# Patient Record
Sex: Male | Born: 1945
Health system: Southern US, Community
[De-identification: ages and names within clinical notes are randomized; demographics above are authoritative.]

## PROBLEM LIST (undated history)

## (undated) DIAGNOSIS — L03119 Cellulitis of unspecified part of limb: Secondary | ICD-10-CM

## (undated) DIAGNOSIS — I509 Heart failure, unspecified: Secondary | ICD-10-CM

## (undated) DIAGNOSIS — J189 Pneumonia, unspecified organism: Secondary | ICD-10-CM

## (undated) DIAGNOSIS — N183 Chronic kidney disease, stage 3 unspecified: Secondary | ICD-10-CM

## (undated) DIAGNOSIS — A0471 Enterocolitis due to Clostridium difficile, recurrent: Secondary | ICD-10-CM

## (undated) DIAGNOSIS — M1990 Unspecified osteoarthritis, unspecified site: Secondary | ICD-10-CM

## (undated) DIAGNOSIS — I2510 Atherosclerotic heart disease of native coronary artery without angina pectoris: Secondary | ICD-10-CM

## (undated) DIAGNOSIS — Z87442 Personal history of urinary calculi: Secondary | ICD-10-CM

## (undated) DIAGNOSIS — N179 Acute kidney failure, unspecified: Secondary | ICD-10-CM

## (undated) DIAGNOSIS — E119 Type 2 diabetes mellitus without complications: Secondary | ICD-10-CM

## (undated) DIAGNOSIS — L02419 Cutaneous abscess of limb, unspecified: Secondary | ICD-10-CM

## (undated) HISTORY — PX: CYSTOSCOPY W/ STONE MANIPULATION: SHX1427

## (undated) HISTORY — PX: CATARACT EXTRACTION W/ INTRAOCULAR LENS  IMPLANT, BILATERAL: SHX1307

## (undated) HISTORY — PX: CORONARY ANGIOPLASTY WITH STENT PLACEMENT: SHX49

## (undated) HISTORY — PX: CLOSED REDUCTION HAND FRACTURE: SHX973

---

## 2011-10-28 DIAGNOSIS — Z79899 Other long term (current) drug therapy: Secondary | ICD-10-CM | POA: Diagnosis not present

## 2011-10-28 DIAGNOSIS — Z23 Encounter for immunization: Secondary | ICD-10-CM | POA: Diagnosis not present

## 2011-10-28 DIAGNOSIS — E119 Type 2 diabetes mellitus without complications: Secondary | ICD-10-CM | POA: Diagnosis not present

## 2011-10-28 DIAGNOSIS — Z125 Encounter for screening for malignant neoplasm of prostate: Secondary | ICD-10-CM | POA: Diagnosis not present

## 2011-10-28 DIAGNOSIS — E782 Mixed hyperlipidemia: Secondary | ICD-10-CM | POA: Diagnosis not present

## 2011-12-08 DIAGNOSIS — H33059 Total retinal detachment, unspecified eye: Secondary | ICD-10-CM | POA: Diagnosis not present

## 2012-05-04 DIAGNOSIS — E119 Type 2 diabetes mellitus without complications: Secondary | ICD-10-CM | POA: Diagnosis not present

## 2012-05-04 DIAGNOSIS — E782 Mixed hyperlipidemia: Secondary | ICD-10-CM | POA: Diagnosis not present

## 2012-05-04 DIAGNOSIS — N529 Male erectile dysfunction, unspecified: Secondary | ICD-10-CM | POA: Diagnosis not present

## 2012-05-04 DIAGNOSIS — J069 Acute upper respiratory infection, unspecified: Secondary | ICD-10-CM | POA: Diagnosis not present

## 2012-10-17 DIAGNOSIS — J342 Deviated nasal septum: Secondary | ICD-10-CM | POA: Diagnosis not present

## 2012-10-17 DIAGNOSIS — R490 Dysphonia: Secondary | ICD-10-CM | POA: Diagnosis not present

## 2012-10-17 DIAGNOSIS — Z87891 Personal history of nicotine dependence: Secondary | ICD-10-CM | POA: Diagnosis not present

## 2012-10-17 DIAGNOSIS — J387 Other diseases of larynx: Secondary | ICD-10-CM | POA: Diagnosis not present

## 2012-11-16 DIAGNOSIS — J387 Other diseases of larynx: Secondary | ICD-10-CM | POA: Diagnosis not present

## 2012-11-25 DIAGNOSIS — I509 Heart failure, unspecified: Secondary | ICD-10-CM | POA: Diagnosis not present

## 2012-11-25 DIAGNOSIS — R0789 Other chest pain: Secondary | ICD-10-CM | POA: Diagnosis not present

## 2012-11-25 DIAGNOSIS — I517 Cardiomegaly: Secondary | ICD-10-CM | POA: Diagnosis not present

## 2012-11-25 DIAGNOSIS — R079 Chest pain, unspecified: Secondary | ICD-10-CM | POA: Diagnosis not present

## 2012-11-25 DIAGNOSIS — E119 Type 2 diabetes mellitus without complications: Secondary | ICD-10-CM | POA: Diagnosis not present

## 2012-11-25 DIAGNOSIS — Z87891 Personal history of nicotine dependence: Secondary | ICD-10-CM | POA: Diagnosis not present

## 2012-11-29 DIAGNOSIS — R0609 Other forms of dyspnea: Secondary | ICD-10-CM | POA: Diagnosis not present

## 2012-11-29 DIAGNOSIS — I428 Other cardiomyopathies: Secondary | ICD-10-CM | POA: Diagnosis not present

## 2012-12-14 DIAGNOSIS — I428 Other cardiomyopathies: Secondary | ICD-10-CM | POA: Diagnosis not present

## 2012-12-14 DIAGNOSIS — I359 Nonrheumatic aortic valve disorder, unspecified: Secondary | ICD-10-CM | POA: Diagnosis not present

## 2012-12-15 DIAGNOSIS — I428 Other cardiomyopathies: Secondary | ICD-10-CM | POA: Diagnosis not present

## 2012-12-15 DIAGNOSIS — R0789 Other chest pain: Secondary | ICD-10-CM | POA: Diagnosis not present

## 2013-01-04 DIAGNOSIS — Z833 Family history of diabetes mellitus: Secondary | ICD-10-CM | POA: Diagnosis not present

## 2013-01-04 DIAGNOSIS — R0602 Shortness of breath: Secondary | ICD-10-CM | POA: Diagnosis not present

## 2013-01-04 DIAGNOSIS — R6889 Other general symptoms and signs: Secondary | ICD-10-CM | POA: Diagnosis not present

## 2013-01-04 DIAGNOSIS — Z809 Family history of malignant neoplasm, unspecified: Secondary | ICD-10-CM | POA: Diagnosis not present

## 2013-01-04 DIAGNOSIS — I509 Heart failure, unspecified: Secondary | ICD-10-CM | POA: Diagnosis not present

## 2013-01-04 DIAGNOSIS — I2589 Other forms of chronic ischemic heart disease: Secondary | ICD-10-CM | POA: Diagnosis not present

## 2013-01-04 DIAGNOSIS — Z8249 Family history of ischemic heart disease and other diseases of the circulatory system: Secondary | ICD-10-CM | POA: Diagnosis not present

## 2013-01-04 DIAGNOSIS — I5042 Chronic combined systolic (congestive) and diastolic (congestive) heart failure: Secondary | ICD-10-CM | POA: Diagnosis not present

## 2013-01-04 DIAGNOSIS — E119 Type 2 diabetes mellitus without complications: Secondary | ICD-10-CM | POA: Diagnosis not present

## 2013-01-11 DIAGNOSIS — Z9861 Coronary angioplasty status: Secondary | ICD-10-CM | POA: Diagnosis not present

## 2013-01-11 DIAGNOSIS — E119 Type 2 diabetes mellitus without complications: Secondary | ICD-10-CM | POA: Diagnosis not present

## 2013-01-11 DIAGNOSIS — Z8249 Family history of ischemic heart disease and other diseases of the circulatory system: Secondary | ICD-10-CM | POA: Diagnosis not present

## 2013-01-11 DIAGNOSIS — Z79899 Other long term (current) drug therapy: Secondary | ICD-10-CM | POA: Diagnosis not present

## 2013-01-11 DIAGNOSIS — E785 Hyperlipidemia, unspecified: Secondary | ICD-10-CM | POA: Diagnosis not present

## 2013-01-11 DIAGNOSIS — I517 Cardiomegaly: Secondary | ICD-10-CM | POA: Diagnosis not present

## 2013-01-11 DIAGNOSIS — Z833 Family history of diabetes mellitus: Secondary | ICD-10-CM | POA: Diagnosis not present

## 2013-01-11 DIAGNOSIS — I509 Heart failure, unspecified: Secondary | ICD-10-CM | POA: Diagnosis not present

## 2013-01-11 DIAGNOSIS — Z808 Family history of malignant neoplasm of other organs or systems: Secondary | ICD-10-CM | POA: Diagnosis not present

## 2013-01-11 DIAGNOSIS — I2510 Atherosclerotic heart disease of native coronary artery without angina pectoris: Secondary | ICD-10-CM | POA: Diagnosis not present

## 2013-01-12 DIAGNOSIS — I509 Heart failure, unspecified: Secondary | ICD-10-CM | POA: Diagnosis not present

## 2013-01-12 DIAGNOSIS — Z9861 Coronary angioplasty status: Secondary | ICD-10-CM | POA: Diagnosis not present

## 2013-01-12 DIAGNOSIS — I517 Cardiomegaly: Secondary | ICD-10-CM | POA: Diagnosis not present

## 2013-01-12 DIAGNOSIS — E119 Type 2 diabetes mellitus without complications: Secondary | ICD-10-CM | POA: Diagnosis not present

## 2013-01-12 DIAGNOSIS — E785 Hyperlipidemia, unspecified: Secondary | ICD-10-CM | POA: Diagnosis not present

## 2013-01-12 DIAGNOSIS — Z8249 Family history of ischemic heart disease and other diseases of the circulatory system: Secondary | ICD-10-CM | POA: Diagnosis not present

## 2013-01-12 DIAGNOSIS — Z833 Family history of diabetes mellitus: Secondary | ICD-10-CM | POA: Diagnosis not present

## 2013-01-12 DIAGNOSIS — I2510 Atherosclerotic heart disease of native coronary artery without angina pectoris: Secondary | ICD-10-CM | POA: Diagnosis not present

## 2013-01-20 DIAGNOSIS — R0602 Shortness of breath: Secondary | ICD-10-CM | POA: Diagnosis not present

## 2013-01-20 DIAGNOSIS — I2589 Other forms of chronic ischemic heart disease: Secondary | ICD-10-CM | POA: Diagnosis not present

## 2013-01-20 DIAGNOSIS — I5042 Chronic combined systolic (congestive) and diastolic (congestive) heart failure: Secondary | ICD-10-CM | POA: Diagnosis not present

## 2013-02-01 DIAGNOSIS — I509 Heart failure, unspecified: Secondary | ICD-10-CM | POA: Diagnosis not present

## 2013-02-01 DIAGNOSIS — I5042 Chronic combined systolic (congestive) and diastolic (congestive) heart failure: Secondary | ICD-10-CM | POA: Diagnosis not present

## 2013-02-01 DIAGNOSIS — R0602 Shortness of breath: Secondary | ICD-10-CM | POA: Diagnosis not present

## 2013-02-01 DIAGNOSIS — I2589 Other forms of chronic ischemic heart disease: Secondary | ICD-10-CM | POA: Diagnosis not present

## 2013-02-14 DIAGNOSIS — Z9861 Coronary angioplasty status: Secondary | ICD-10-CM | POA: Diagnosis not present

## 2013-02-14 DIAGNOSIS — Z5189 Encounter for other specified aftercare: Secondary | ICD-10-CM | POA: Diagnosis not present

## 2013-02-15 DIAGNOSIS — Z5189 Encounter for other specified aftercare: Secondary | ICD-10-CM | POA: Diagnosis not present

## 2013-02-15 DIAGNOSIS — Z9861 Coronary angioplasty status: Secondary | ICD-10-CM | POA: Diagnosis not present

## 2013-02-17 DIAGNOSIS — Z9861 Coronary angioplasty status: Secondary | ICD-10-CM | POA: Diagnosis not present

## 2013-02-17 DIAGNOSIS — Z5189 Encounter for other specified aftercare: Secondary | ICD-10-CM | POA: Diagnosis not present

## 2013-02-20 DIAGNOSIS — Z5189 Encounter for other specified aftercare: Secondary | ICD-10-CM | POA: Diagnosis not present

## 2013-02-20 DIAGNOSIS — Z9861 Coronary angioplasty status: Secondary | ICD-10-CM | POA: Diagnosis not present

## 2013-02-22 DIAGNOSIS — Z5189 Encounter for other specified aftercare: Secondary | ICD-10-CM | POA: Diagnosis not present

## 2013-02-22 DIAGNOSIS — Z9861 Coronary angioplasty status: Secondary | ICD-10-CM | POA: Diagnosis not present

## 2013-02-24 DIAGNOSIS — Z9861 Coronary angioplasty status: Secondary | ICD-10-CM | POA: Diagnosis not present

## 2013-02-24 DIAGNOSIS — Z5189 Encounter for other specified aftercare: Secondary | ICD-10-CM | POA: Diagnosis not present

## 2013-02-27 DIAGNOSIS — Z5189 Encounter for other specified aftercare: Secondary | ICD-10-CM | POA: Diagnosis not present

## 2013-02-27 DIAGNOSIS — Z9861 Coronary angioplasty status: Secondary | ICD-10-CM | POA: Diagnosis not present

## 2013-02-28 DIAGNOSIS — I428 Other cardiomyopathies: Secondary | ICD-10-CM | POA: Diagnosis not present

## 2013-02-28 DIAGNOSIS — E782 Mixed hyperlipidemia: Secondary | ICD-10-CM | POA: Diagnosis not present

## 2013-02-28 DIAGNOSIS — Z125 Encounter for screening for malignant neoplasm of prostate: Secondary | ICD-10-CM | POA: Diagnosis not present

## 2013-02-28 DIAGNOSIS — I2589 Other forms of chronic ischemic heart disease: Secondary | ICD-10-CM | POA: Diagnosis not present

## 2013-02-28 DIAGNOSIS — R0602 Shortness of breath: Secondary | ICD-10-CM | POA: Diagnosis not present

## 2013-02-28 DIAGNOSIS — E785 Hyperlipidemia, unspecified: Secondary | ICD-10-CM | POA: Diagnosis not present

## 2013-02-28 DIAGNOSIS — Z79899 Other long term (current) drug therapy: Secondary | ICD-10-CM | POA: Diagnosis not present

## 2013-02-28 DIAGNOSIS — E119 Type 2 diabetes mellitus without complications: Secondary | ICD-10-CM | POA: Diagnosis not present

## 2013-02-28 DIAGNOSIS — R972 Elevated prostate specific antigen [PSA]: Secondary | ICD-10-CM | POA: Diagnosis not present

## 2013-02-28 DIAGNOSIS — I5042 Chronic combined systolic (congestive) and diastolic (congestive) heart failure: Secondary | ICD-10-CM | POA: Diagnosis not present

## 2013-02-28 DIAGNOSIS — Z0000 Encounter for general adult medical examination without abnormal findings: Secondary | ICD-10-CM | POA: Diagnosis not present

## 2013-02-28 DIAGNOSIS — I509 Heart failure, unspecified: Secondary | ICD-10-CM | POA: Diagnosis not present

## 2013-03-01 DIAGNOSIS — Z9861 Coronary angioplasty status: Secondary | ICD-10-CM | POA: Diagnosis not present

## 2013-03-01 DIAGNOSIS — Z5189 Encounter for other specified aftercare: Secondary | ICD-10-CM | POA: Diagnosis not present

## 2013-03-03 DIAGNOSIS — Z5189 Encounter for other specified aftercare: Secondary | ICD-10-CM | POA: Diagnosis not present

## 2013-03-03 DIAGNOSIS — Z9861 Coronary angioplasty status: Secondary | ICD-10-CM | POA: Diagnosis not present

## 2013-03-06 DIAGNOSIS — Z5189 Encounter for other specified aftercare: Secondary | ICD-10-CM | POA: Diagnosis not present

## 2013-03-06 DIAGNOSIS — Z9861 Coronary angioplasty status: Secondary | ICD-10-CM | POA: Diagnosis not present

## 2013-03-08 DIAGNOSIS — Z9861 Coronary angioplasty status: Secondary | ICD-10-CM | POA: Diagnosis not present

## 2013-03-08 DIAGNOSIS — Z5189 Encounter for other specified aftercare: Secondary | ICD-10-CM | POA: Diagnosis not present

## 2013-03-13 DIAGNOSIS — Z5189 Encounter for other specified aftercare: Secondary | ICD-10-CM | POA: Diagnosis not present

## 2013-03-13 DIAGNOSIS — Z9861 Coronary angioplasty status: Secondary | ICD-10-CM | POA: Diagnosis not present

## 2013-03-27 DIAGNOSIS — S63509A Unspecified sprain of unspecified wrist, initial encounter: Secondary | ICD-10-CM | POA: Diagnosis not present

## 2013-04-12 DIAGNOSIS — Z9861 Coronary angioplasty status: Secondary | ICD-10-CM | POA: Diagnosis not present

## 2013-04-12 DIAGNOSIS — Z5189 Encounter for other specified aftercare: Secondary | ICD-10-CM | POA: Diagnosis not present

## 2013-04-17 DIAGNOSIS — Z5189 Encounter for other specified aftercare: Secondary | ICD-10-CM | POA: Diagnosis not present

## 2013-04-17 DIAGNOSIS — Z9861 Coronary angioplasty status: Secondary | ICD-10-CM | POA: Diagnosis not present

## 2013-04-19 DIAGNOSIS — Z9861 Coronary angioplasty status: Secondary | ICD-10-CM | POA: Diagnosis not present

## 2013-04-19 DIAGNOSIS — Z5189 Encounter for other specified aftercare: Secondary | ICD-10-CM | POA: Diagnosis not present

## 2013-04-21 DIAGNOSIS — Z9861 Coronary angioplasty status: Secondary | ICD-10-CM | POA: Diagnosis not present

## 2013-04-21 DIAGNOSIS — Z5189 Encounter for other specified aftercare: Secondary | ICD-10-CM | POA: Diagnosis not present

## 2013-04-24 DIAGNOSIS — Z5189 Encounter for other specified aftercare: Secondary | ICD-10-CM | POA: Diagnosis not present

## 2013-04-24 DIAGNOSIS — Z9861 Coronary angioplasty status: Secondary | ICD-10-CM | POA: Diagnosis not present

## 2013-04-26 DIAGNOSIS — Z9861 Coronary angioplasty status: Secondary | ICD-10-CM | POA: Diagnosis not present

## 2013-04-26 DIAGNOSIS — Z5189 Encounter for other specified aftercare: Secondary | ICD-10-CM | POA: Diagnosis not present

## 2013-04-28 DIAGNOSIS — Z9861 Coronary angioplasty status: Secondary | ICD-10-CM | POA: Diagnosis not present

## 2013-04-28 DIAGNOSIS — Z5189 Encounter for other specified aftercare: Secondary | ICD-10-CM | POA: Diagnosis not present

## 2013-05-01 DIAGNOSIS — Z9861 Coronary angioplasty status: Secondary | ICD-10-CM | POA: Diagnosis not present

## 2013-05-01 DIAGNOSIS — Z5189 Encounter for other specified aftercare: Secondary | ICD-10-CM | POA: Diagnosis not present

## 2013-05-03 DIAGNOSIS — Z9861 Coronary angioplasty status: Secondary | ICD-10-CM | POA: Diagnosis not present

## 2013-05-03 DIAGNOSIS — Z5189 Encounter for other specified aftercare: Secondary | ICD-10-CM | POA: Diagnosis not present

## 2013-05-08 DIAGNOSIS — Z5189 Encounter for other specified aftercare: Secondary | ICD-10-CM | POA: Diagnosis not present

## 2013-05-08 DIAGNOSIS — Z9861 Coronary angioplasty status: Secondary | ICD-10-CM | POA: Diagnosis not present

## 2013-05-10 DIAGNOSIS — Z9861 Coronary angioplasty status: Secondary | ICD-10-CM | POA: Diagnosis not present

## 2013-05-10 DIAGNOSIS — Z5189 Encounter for other specified aftercare: Secondary | ICD-10-CM | POA: Diagnosis not present

## 2013-05-31 DIAGNOSIS — I509 Heart failure, unspecified: Secondary | ICD-10-CM | POA: Diagnosis not present

## 2013-05-31 DIAGNOSIS — R0602 Shortness of breath: Secondary | ICD-10-CM | POA: Diagnosis not present

## 2013-05-31 DIAGNOSIS — I5042 Chronic combined systolic (congestive) and diastolic (congestive) heart failure: Secondary | ICD-10-CM | POA: Diagnosis not present

## 2013-05-31 DIAGNOSIS — I2589 Other forms of chronic ischemic heart disease: Secondary | ICD-10-CM | POA: Diagnosis not present

## 2013-07-26 DIAGNOSIS — I428 Other cardiomyopathies: Secondary | ICD-10-CM | POA: Diagnosis not present

## 2013-07-26 DIAGNOSIS — I509 Heart failure, unspecified: Secondary | ICD-10-CM | POA: Diagnosis not present

## 2013-08-01 DIAGNOSIS — R0602 Shortness of breath: Secondary | ICD-10-CM | POA: Diagnosis not present

## 2013-08-01 DIAGNOSIS — I509 Heart failure, unspecified: Secondary | ICD-10-CM | POA: Diagnosis not present

## 2013-08-01 DIAGNOSIS — E785 Hyperlipidemia, unspecified: Secondary | ICD-10-CM | POA: Diagnosis not present

## 2013-08-01 DIAGNOSIS — I5042 Chronic combined systolic (congestive) and diastolic (congestive) heart failure: Secondary | ICD-10-CM | POA: Diagnosis not present

## 2013-08-01 DIAGNOSIS — E119 Type 2 diabetes mellitus without complications: Secondary | ICD-10-CM | POA: Diagnosis not present

## 2013-08-01 DIAGNOSIS — I2589 Other forms of chronic ischemic heart disease: Secondary | ICD-10-CM | POA: Diagnosis not present

## 2013-08-02 DIAGNOSIS — E119 Type 2 diabetes mellitus without complications: Secondary | ICD-10-CM | POA: Diagnosis not present

## 2013-08-02 DIAGNOSIS — D170 Benign lipomatous neoplasm of skin and subcutaneous tissue of head, face and neck: Secondary | ICD-10-CM | POA: Diagnosis not present

## 2013-08-02 DIAGNOSIS — I2510 Atherosclerotic heart disease of native coronary artery without angina pectoris: Secondary | ICD-10-CM | POA: Diagnosis not present

## 2013-08-02 DIAGNOSIS — E7800 Pure hypercholesterolemia, unspecified: Secondary | ICD-10-CM | POA: Diagnosis not present

## 2013-09-22 DIAGNOSIS — R55 Syncope and collapse: Secondary | ICD-10-CM | POA: Diagnosis not present

## 2013-09-22 DIAGNOSIS — R5381 Other malaise: Secondary | ICD-10-CM | POA: Diagnosis not present

## 2013-12-12 DIAGNOSIS — Z125 Encounter for screening for malignant neoplasm of prostate: Secondary | ICD-10-CM | POA: Diagnosis not present

## 2013-12-12 DIAGNOSIS — R972 Elevated prostate specific antigen [PSA]: Secondary | ICD-10-CM | POA: Diagnosis not present

## 2013-12-12 DIAGNOSIS — I2589 Other forms of chronic ischemic heart disease: Secondary | ICD-10-CM | POA: Diagnosis not present

## 2013-12-12 DIAGNOSIS — E119 Type 2 diabetes mellitus without complications: Secondary | ICD-10-CM | POA: Diagnosis not present

## 2013-12-12 DIAGNOSIS — Z79899 Other long term (current) drug therapy: Secondary | ICD-10-CM | POA: Diagnosis not present

## 2013-12-12 DIAGNOSIS — E782 Mixed hyperlipidemia: Secondary | ICD-10-CM | POA: Diagnosis not present

## 2014-04-05 DIAGNOSIS — I2510 Atherosclerotic heart disease of native coronary artery without angina pectoris: Secondary | ICD-10-CM | POA: Diagnosis not present

## 2014-04-05 DIAGNOSIS — I7090 Unspecified atherosclerosis: Secondary | ICD-10-CM | POA: Diagnosis not present

## 2014-04-05 DIAGNOSIS — E785 Hyperlipidemia, unspecified: Secondary | ICD-10-CM | POA: Diagnosis not present

## 2014-04-05 DIAGNOSIS — I2589 Other forms of chronic ischemic heart disease: Secondary | ICD-10-CM | POA: Diagnosis not present

## 2014-04-05 DIAGNOSIS — Z9889 Other specified postprocedural states: Secondary | ICD-10-CM | POA: Diagnosis not present

## 2014-04-05 DIAGNOSIS — I509 Heart failure, unspecified: Secondary | ICD-10-CM | POA: Diagnosis not present

## 2014-04-05 DIAGNOSIS — I5042 Chronic combined systolic (congestive) and diastolic (congestive) heart failure: Secondary | ICD-10-CM | POA: Diagnosis not present

## 2014-08-17 DIAGNOSIS — E119 Type 2 diabetes mellitus without complications: Secondary | ICD-10-CM | POA: Diagnosis not present

## 2014-08-17 DIAGNOSIS — J209 Acute bronchitis, unspecified: Secondary | ICD-10-CM | POA: Diagnosis not present

## 2014-08-17 DIAGNOSIS — Z23 Encounter for immunization: Secondary | ICD-10-CM | POA: Diagnosis not present

## 2014-09-24 DIAGNOSIS — Z79899 Other long term (current) drug therapy: Secondary | ICD-10-CM | POA: Diagnosis not present

## 2014-09-24 DIAGNOSIS — E119 Type 2 diabetes mellitus without complications: Secondary | ICD-10-CM | POA: Diagnosis not present

## 2014-09-24 DIAGNOSIS — R972 Elevated prostate specific antigen [PSA]: Secondary | ICD-10-CM | POA: Diagnosis not present

## 2014-09-24 DIAGNOSIS — Z125 Encounter for screening for malignant neoplasm of prostate: Secondary | ICD-10-CM | POA: Diagnosis not present

## 2014-09-24 DIAGNOSIS — D649 Anemia, unspecified: Secondary | ICD-10-CM | POA: Diagnosis not present

## 2015-02-21 DIAGNOSIS — E538 Deficiency of other specified B group vitamins: Secondary | ICD-10-CM | POA: Diagnosis not present

## 2015-02-21 DIAGNOSIS — I255 Ischemic cardiomyopathy: Secondary | ICD-10-CM | POA: Diagnosis not present

## 2015-02-21 DIAGNOSIS — Z79899 Other long term (current) drug therapy: Secondary | ICD-10-CM | POA: Diagnosis not present

## 2015-02-21 DIAGNOSIS — E782 Mixed hyperlipidemia: Secondary | ICD-10-CM | POA: Diagnosis not present

## 2015-02-21 DIAGNOSIS — E1165 Type 2 diabetes mellitus with hyperglycemia: Secondary | ICD-10-CM | POA: Diagnosis not present

## 2015-02-21 DIAGNOSIS — Z0000 Encounter for general adult medical examination without abnormal findings: Secondary | ICD-10-CM | POA: Diagnosis not present

## 2015-04-03 DIAGNOSIS — J0120 Acute ethmoidal sinusitis, unspecified: Secondary | ICD-10-CM | POA: Diagnosis not present

## 2015-10-10 DIAGNOSIS — E1165 Type 2 diabetes mellitus with hyperglycemia: Secondary | ICD-10-CM | POA: Diagnosis not present

## 2015-10-10 DIAGNOSIS — Z79899 Other long term (current) drug therapy: Secondary | ICD-10-CM | POA: Diagnosis not present

## 2015-10-10 DIAGNOSIS — E782 Mixed hyperlipidemia: Secondary | ICD-10-CM | POA: Diagnosis not present

## 2015-10-10 DIAGNOSIS — I255 Ischemic cardiomyopathy: Secondary | ICD-10-CM | POA: Diagnosis not present

## 2015-10-10 DIAGNOSIS — K219 Gastro-esophageal reflux disease without esophagitis: Secondary | ICD-10-CM | POA: Diagnosis not present

## 2016-01-16 DIAGNOSIS — Z79899 Other long term (current) drug therapy: Secondary | ICD-10-CM | POA: Diagnosis not present

## 2016-01-16 DIAGNOSIS — I255 Ischemic cardiomyopathy: Secondary | ICD-10-CM | POA: Diagnosis not present

## 2016-01-16 DIAGNOSIS — E1165 Type 2 diabetes mellitus with hyperglycemia: Secondary | ICD-10-CM | POA: Diagnosis not present

## 2016-01-23 DIAGNOSIS — I255 Ischemic cardiomyopathy: Secondary | ICD-10-CM | POA: Diagnosis not present

## 2016-01-23 DIAGNOSIS — G4710 Hypersomnia, unspecified: Secondary | ICD-10-CM | POA: Diagnosis not present

## 2016-01-23 DIAGNOSIS — E1165 Type 2 diabetes mellitus with hyperglycemia: Secondary | ICD-10-CM | POA: Diagnosis not present

## 2016-01-23 DIAGNOSIS — J0120 Acute ethmoidal sinusitis, unspecified: Secondary | ICD-10-CM | POA: Diagnosis not present

## 2016-02-26 DIAGNOSIS — R0609 Other forms of dyspnea: Secondary | ICD-10-CM | POA: Diagnosis not present

## 2016-02-26 DIAGNOSIS — Z959 Presence of cardiac and vascular implant and graft, unspecified: Secondary | ICD-10-CM | POA: Diagnosis not present

## 2016-02-26 DIAGNOSIS — I255 Ischemic cardiomyopathy: Secondary | ICD-10-CM | POA: Diagnosis not present

## 2016-02-26 DIAGNOSIS — I5042 Chronic combined systolic (congestive) and diastolic (congestive) heart failure: Secondary | ICD-10-CM | POA: Diagnosis not present

## 2016-02-26 DIAGNOSIS — R600 Localized edema: Secondary | ICD-10-CM | POA: Diagnosis not present

## 2016-02-26 DIAGNOSIS — E785 Hyperlipidemia, unspecified: Secondary | ICD-10-CM | POA: Diagnosis not present

## 2016-02-26 DIAGNOSIS — I2510 Atherosclerotic heart disease of native coronary artery without angina pectoris: Secondary | ICD-10-CM | POA: Diagnosis not present

## 2016-02-27 DIAGNOSIS — I361 Nonrheumatic tricuspid (valve) insufficiency: Secondary | ICD-10-CM | POA: Diagnosis not present

## 2016-02-27 DIAGNOSIS — I517 Cardiomegaly: Secondary | ICD-10-CM | POA: Diagnosis not present

## 2016-02-27 DIAGNOSIS — I272 Other secondary pulmonary hypertension: Secondary | ICD-10-CM | POA: Diagnosis not present

## 2016-03-17 DIAGNOSIS — I340 Nonrheumatic mitral (valve) insufficiency: Secondary | ICD-10-CM | POA: Diagnosis not present

## 2016-03-17 DIAGNOSIS — R0609 Other forms of dyspnea: Secondary | ICD-10-CM | POA: Diagnosis not present

## 2016-03-17 DIAGNOSIS — I255 Ischemic cardiomyopathy: Secondary | ICD-10-CM | POA: Diagnosis not present

## 2016-03-17 DIAGNOSIS — I517 Cardiomegaly: Secondary | ICD-10-CM | POA: Diagnosis not present

## 2016-03-17 DIAGNOSIS — I5042 Chronic combined systolic (congestive) and diastolic (congestive) heart failure: Secondary | ICD-10-CM | POA: Diagnosis not present

## 2016-03-17 DIAGNOSIS — R0602 Shortness of breath: Secondary | ICD-10-CM | POA: Diagnosis not present

## 2016-03-17 DIAGNOSIS — I2510 Atherosclerotic heart disease of native coronary artery without angina pectoris: Secondary | ICD-10-CM | POA: Diagnosis not present

## 2016-03-17 DIAGNOSIS — E785 Hyperlipidemia, unspecified: Secondary | ICD-10-CM | POA: Diagnosis not present

## 2016-03-17 DIAGNOSIS — Z959 Presence of cardiac and vascular implant and graft, unspecified: Secondary | ICD-10-CM | POA: Diagnosis not present

## 2016-03-24 DIAGNOSIS — Z955 Presence of coronary angioplasty implant and graft: Secondary | ICD-10-CM | POA: Diagnosis not present

## 2016-03-24 DIAGNOSIS — Z87891 Personal history of nicotine dependence: Secondary | ICD-10-CM | POA: Diagnosis not present

## 2016-03-24 DIAGNOSIS — I420 Dilated cardiomyopathy: Secondary | ICD-10-CM | POA: Diagnosis not present

## 2016-03-24 DIAGNOSIS — Z79899 Other long term (current) drug therapy: Secondary | ICD-10-CM | POA: Diagnosis not present

## 2016-03-24 DIAGNOSIS — E785 Hyperlipidemia, unspecified: Secondary | ICD-10-CM | POA: Diagnosis not present

## 2016-03-24 DIAGNOSIS — I2510 Atherosclerotic heart disease of native coronary artery without angina pectoris: Secondary | ICD-10-CM | POA: Diagnosis not present

## 2016-03-24 DIAGNOSIS — I501 Left ventricular failure: Secondary | ICD-10-CM | POA: Diagnosis not present

## 2016-03-24 DIAGNOSIS — I4581 Long QT syndrome: Secondary | ICD-10-CM | POA: Diagnosis not present

## 2016-03-24 DIAGNOSIS — E119 Type 2 diabetes mellitus without complications: Secondary | ICD-10-CM | POA: Diagnosis not present

## 2016-03-24 DIAGNOSIS — I509 Heart failure, unspecified: Secondary | ICD-10-CM | POA: Diagnosis not present

## 2016-03-24 DIAGNOSIS — Z7984 Long term (current) use of oral hypoglycemic drugs: Secondary | ICD-10-CM | POA: Diagnosis not present

## 2016-03-24 DIAGNOSIS — Z7982 Long term (current) use of aspirin: Secondary | ICD-10-CM | POA: Diagnosis not present

## 2016-04-10 DIAGNOSIS — I2510 Atherosclerotic heart disease of native coronary artery without angina pectoris: Secondary | ICD-10-CM | POA: Diagnosis not present

## 2016-04-10 DIAGNOSIS — Z959 Presence of cardiac and vascular implant and graft, unspecified: Secondary | ICD-10-CM | POA: Diagnosis not present

## 2016-04-10 DIAGNOSIS — I5042 Chronic combined systolic (congestive) and diastolic (congestive) heart failure: Secondary | ICD-10-CM | POA: Diagnosis not present

## 2016-04-10 DIAGNOSIS — R0609 Other forms of dyspnea: Secondary | ICD-10-CM | POA: Diagnosis not present

## 2016-04-10 DIAGNOSIS — I255 Ischemic cardiomyopathy: Secondary | ICD-10-CM | POA: Diagnosis not present

## 2016-05-13 DIAGNOSIS — I255 Ischemic cardiomyopathy: Secondary | ICD-10-CM | POA: Diagnosis not present

## 2016-05-13 DIAGNOSIS — I5042 Chronic combined systolic (congestive) and diastolic (congestive) heart failure: Secondary | ICD-10-CM | POA: Diagnosis not present

## 2016-05-13 DIAGNOSIS — I2510 Atherosclerotic heart disease of native coronary artery without angina pectoris: Secondary | ICD-10-CM | POA: Diagnosis not present

## 2016-05-13 DIAGNOSIS — Z955 Presence of coronary angioplasty implant and graft: Secondary | ICD-10-CM | POA: Diagnosis not present

## 2016-05-26 DIAGNOSIS — I429 Cardiomyopathy, unspecified: Secondary | ICD-10-CM | POA: Diagnosis not present

## 2016-05-28 DIAGNOSIS — I255 Ischemic cardiomyopathy: Secondary | ICD-10-CM | POA: Diagnosis not present

## 2016-05-28 DIAGNOSIS — I2510 Atherosclerotic heart disease of native coronary artery without angina pectoris: Secondary | ICD-10-CM | POA: Diagnosis not present

## 2016-05-28 DIAGNOSIS — I5042 Chronic combined systolic (congestive) and diastolic (congestive) heart failure: Secondary | ICD-10-CM | POA: Diagnosis not present

## 2016-06-03 DIAGNOSIS — I83029 Varicose veins of left lower extremity with ulcer of unspecified site: Secondary | ICD-10-CM | POA: Diagnosis not present

## 2016-06-03 DIAGNOSIS — R609 Edema, unspecified: Secondary | ICD-10-CM | POA: Diagnosis not present

## 2016-06-10 DIAGNOSIS — Z1389 Encounter for screening for other disorder: Secondary | ICD-10-CM | POA: Diagnosis not present

## 2016-06-10 DIAGNOSIS — Z9181 History of falling: Secondary | ICD-10-CM | POA: Diagnosis not present

## 2016-06-10 DIAGNOSIS — I83029 Varicose veins of left lower extremity with ulcer of unspecified site: Secondary | ICD-10-CM | POA: Diagnosis not present

## 2016-08-21 DIAGNOSIS — I2510 Atherosclerotic heart disease of native coronary artery without angina pectoris: Secondary | ICD-10-CM | POA: Diagnosis not present

## 2016-08-21 DIAGNOSIS — I5042 Chronic combined systolic (congestive) and diastolic (congestive) heart failure: Secondary | ICD-10-CM | POA: Diagnosis not present

## 2016-08-21 DIAGNOSIS — I255 Ischemic cardiomyopathy: Secondary | ICD-10-CM | POA: Diagnosis not present

## 2016-12-02 DIAGNOSIS — Z23 Encounter for immunization: Secondary | ICD-10-CM | POA: Diagnosis not present

## 2016-12-02 DIAGNOSIS — I255 Ischemic cardiomyopathy: Secondary | ICD-10-CM | POA: Diagnosis not present

## 2016-12-02 DIAGNOSIS — I872 Venous insufficiency (chronic) (peripheral): Secondary | ICD-10-CM | POA: Diagnosis not present

## 2016-12-02 DIAGNOSIS — E1165 Type 2 diabetes mellitus with hyperglycemia: Secondary | ICD-10-CM | POA: Diagnosis not present

## 2016-12-02 DIAGNOSIS — Z79899 Other long term (current) drug therapy: Secondary | ICD-10-CM | POA: Diagnosis not present

## 2016-12-10 DIAGNOSIS — I255 Ischemic cardiomyopathy: Secondary | ICD-10-CM | POA: Diagnosis not present

## 2016-12-10 DIAGNOSIS — I5023 Acute on chronic systolic (congestive) heart failure: Secondary | ICD-10-CM | POA: Diagnosis not present

## 2016-12-10 DIAGNOSIS — I2510 Atherosclerotic heart disease of native coronary artery without angina pectoris: Secondary | ICD-10-CM | POA: Diagnosis present

## 2016-12-10 DIAGNOSIS — I509 Heart failure, unspecified: Secondary | ICD-10-CM | POA: Diagnosis not present

## 2016-12-10 DIAGNOSIS — Z79899 Other long term (current) drug therapy: Secondary | ICD-10-CM | POA: Diagnosis not present

## 2016-12-10 DIAGNOSIS — I110 Hypertensive heart disease with heart failure: Secondary | ICD-10-CM | POA: Diagnosis not present

## 2016-12-10 DIAGNOSIS — E7800 Pure hypercholesterolemia, unspecified: Secondary | ICD-10-CM | POA: Diagnosis not present

## 2016-12-10 DIAGNOSIS — I872 Venous insufficiency (chronic) (peripheral): Secondary | ICD-10-CM | POA: Diagnosis not present

## 2016-12-10 DIAGNOSIS — R0602 Shortness of breath: Secondary | ICD-10-CM | POA: Diagnosis not present

## 2016-12-10 DIAGNOSIS — J810 Acute pulmonary edema: Secondary | ICD-10-CM | POA: Diagnosis not present

## 2016-12-10 DIAGNOSIS — R0902 Hypoxemia: Secondary | ICD-10-CM | POA: Diagnosis not present

## 2016-12-10 DIAGNOSIS — Z7982 Long term (current) use of aspirin: Secondary | ICD-10-CM | POA: Diagnosis not present

## 2016-12-10 DIAGNOSIS — Z955 Presence of coronary angioplasty implant and graft: Secondary | ICD-10-CM | POA: Diagnosis not present

## 2016-12-10 DIAGNOSIS — E119 Type 2 diabetes mellitus without complications: Secondary | ICD-10-CM | POA: Diagnosis not present

## 2016-12-10 DIAGNOSIS — Z9114 Patient's other noncompliance with medication regimen: Secondary | ICD-10-CM | POA: Diagnosis not present

## 2016-12-16 DIAGNOSIS — I509 Heart failure, unspecified: Secondary | ICD-10-CM | POA: Diagnosis not present

## 2016-12-16 DIAGNOSIS — I255 Ischemic cardiomyopathy: Secondary | ICD-10-CM | POA: Diagnosis not present

## 2016-12-16 DIAGNOSIS — I429 Cardiomyopathy, unspecified: Secondary | ICD-10-CM | POA: Diagnosis not present

## 2016-12-16 DIAGNOSIS — I517 Cardiomegaly: Secondary | ICD-10-CM | POA: Diagnosis not present

## 2016-12-25 DIAGNOSIS — R34 Anuria and oliguria: Secondary | ICD-10-CM | POA: Diagnosis not present

## 2016-12-25 DIAGNOSIS — E875 Hyperkalemia: Secondary | ICD-10-CM | POA: Diagnosis not present

## 2016-12-25 DIAGNOSIS — J9601 Acute respiratory failure with hypoxia: Secondary | ICD-10-CM | POA: Diagnosis not present

## 2016-12-25 DIAGNOSIS — R0602 Shortness of breath: Secondary | ICD-10-CM | POA: Diagnosis not present

## 2016-12-25 DIAGNOSIS — N179 Acute kidney failure, unspecified: Secondary | ICD-10-CM | POA: Diagnosis not present

## 2016-12-26 ENCOUNTER — Encounter (HOSPITAL_COMMUNITY): Payer: Self-pay | Admitting: Internal Medicine

## 2016-12-26 ENCOUNTER — Inpatient Hospital Stay (HOSPITAL_COMMUNITY): Payer: Medicare Other

## 2016-12-26 ENCOUNTER — Inpatient Hospital Stay (HOSPITAL_COMMUNITY)
Admission: EM | Admit: 2016-12-26 | Discharge: 2017-01-05 | DRG: 870 | Disposition: A | Discharge status: Home Health | Payer: Medicare Other | Source: Other Acute Inpatient Hospital | Attending: Internal Medicine | Admitting: Internal Medicine

## 2016-12-26 DIAGNOSIS — I110 Hypertensive heart disease with heart failure: Secondary | ICD-10-CM | POA: Diagnosis present

## 2016-12-26 DIAGNOSIS — Z955 Presence of coronary angioplasty implant and graft: Secondary | ICD-10-CM | POA: Diagnosis not present

## 2016-12-26 DIAGNOSIS — Z4682 Encounter for fitting and adjustment of non-vascular catheter: Secondary | ICD-10-CM | POA: Diagnosis not present

## 2016-12-26 DIAGNOSIS — J9602 Acute respiratory failure with hypercapnia: Secondary | ICD-10-CM

## 2016-12-26 DIAGNOSIS — D649 Anemia, unspecified: Secondary | ICD-10-CM | POA: Diagnosis present

## 2016-12-26 DIAGNOSIS — I255 Ischemic cardiomyopathy: Secondary | ICD-10-CM | POA: Diagnosis present

## 2016-12-26 DIAGNOSIS — I509 Heart failure, unspecified: Secondary | ICD-10-CM | POA: Diagnosis not present

## 2016-12-26 DIAGNOSIS — I2510 Atherosclerotic heart disease of native coronary artery without angina pectoris: Secondary | ICD-10-CM | POA: Diagnosis present

## 2016-12-26 DIAGNOSIS — Z978 Presence of other specified devices: Secondary | ICD-10-CM

## 2016-12-26 DIAGNOSIS — J918 Pleural effusion in other conditions classified elsewhere: Secondary | ICD-10-CM | POA: Diagnosis present

## 2016-12-26 DIAGNOSIS — L03119 Cellulitis of unspecified part of limb: Secondary | ICD-10-CM | POA: Diagnosis present

## 2016-12-26 DIAGNOSIS — R310 Gross hematuria: Secondary | ICD-10-CM | POA: Diagnosis not present

## 2016-12-26 DIAGNOSIS — J90 Pleural effusion, not elsewhere classified: Secondary | ICD-10-CM

## 2016-12-26 DIAGNOSIS — N179 Acute kidney failure, unspecified: Secondary | ICD-10-CM | POA: Diagnosis not present

## 2016-12-26 DIAGNOSIS — R579 Shock, unspecified: Secondary | ICD-10-CM | POA: Diagnosis present

## 2016-12-26 DIAGNOSIS — I5041 Acute combined systolic (congestive) and diastolic (congestive) heart failure: Secondary | ICD-10-CM | POA: Diagnosis not present

## 2016-12-26 DIAGNOSIS — J9601 Acute respiratory failure with hypoxia: Secondary | ICD-10-CM

## 2016-12-26 DIAGNOSIS — Z4659 Encounter for fitting and adjustment of other gastrointestinal appliance and device: Secondary | ICD-10-CM

## 2016-12-26 DIAGNOSIS — E875 Hyperkalemia: Secondary | ICD-10-CM | POA: Diagnosis not present

## 2016-12-26 DIAGNOSIS — T82594A Other mechanical complication of infusion catheter, initial encounter: Secondary | ICD-10-CM

## 2016-12-26 DIAGNOSIS — N189 Chronic kidney disease, unspecified: Secondary | ICD-10-CM

## 2016-12-26 DIAGNOSIS — F419 Anxiety disorder, unspecified: Secondary | ICD-10-CM | POA: Diagnosis not present

## 2016-12-26 DIAGNOSIS — R319 Hematuria, unspecified: Secondary | ICD-10-CM | POA: Diagnosis not present

## 2016-12-26 DIAGNOSIS — R34 Anuria and oliguria: Secondary | ICD-10-CM | POA: Diagnosis not present

## 2016-12-26 DIAGNOSIS — N421 Congestion and hemorrhage of prostate: Secondary | ICD-10-CM | POA: Diagnosis present

## 2016-12-26 DIAGNOSIS — E1159 Type 2 diabetes mellitus with other circulatory complications: Secondary | ICD-10-CM

## 2016-12-26 DIAGNOSIS — R0602 Shortness of breath: Secondary | ICD-10-CM | POA: Diagnosis not present

## 2016-12-26 DIAGNOSIS — J9600 Acute respiratory failure, unspecified whether with hypoxia or hypercapnia: Secondary | ICD-10-CM | POA: Diagnosis not present

## 2016-12-26 DIAGNOSIS — Z9289 Personal history of other medical treatment: Secondary | ICD-10-CM

## 2016-12-26 DIAGNOSIS — J9690 Respiratory failure, unspecified, unspecified whether with hypoxia or hypercapnia: Secondary | ICD-10-CM

## 2016-12-26 DIAGNOSIS — R1310 Dysphagia, unspecified: Secondary | ICD-10-CM | POA: Diagnosis not present

## 2016-12-26 DIAGNOSIS — I5043 Acute on chronic combined systolic (congestive) and diastolic (congestive) heart failure: Secondary | ICD-10-CM | POA: Diagnosis present

## 2016-12-26 DIAGNOSIS — Z452 Encounter for adjustment and management of vascular access device: Secondary | ICD-10-CM | POA: Diagnosis not present

## 2016-12-26 DIAGNOSIS — A419 Sepsis, unspecified organism: Secondary | ICD-10-CM | POA: Diagnosis present

## 2016-12-26 DIAGNOSIS — E119 Type 2 diabetes mellitus without complications: Secondary | ICD-10-CM | POA: Diagnosis present

## 2016-12-26 DIAGNOSIS — I471 Supraventricular tachycardia: Secondary | ICD-10-CM | POA: Diagnosis not present

## 2016-12-26 DIAGNOSIS — E1169 Type 2 diabetes mellitus with other specified complication: Secondary | ICD-10-CM | POA: Diagnosis not present

## 2016-12-26 DIAGNOSIS — J9811 Atelectasis: Secondary | ICD-10-CM | POA: Diagnosis not present

## 2016-12-26 DIAGNOSIS — Z9889 Other specified postprocedural states: Secondary | ICD-10-CM

## 2016-12-26 DIAGNOSIS — J948 Other specified pleural conditions: Secondary | ICD-10-CM | POA: Diagnosis not present

## 2016-12-26 DIAGNOSIS — I517 Cardiomegaly: Secondary | ICD-10-CM | POA: Diagnosis not present

## 2016-12-26 DIAGNOSIS — Z781 Physical restraint status: Secondary | ICD-10-CM

## 2016-12-26 DIAGNOSIS — J811 Chronic pulmonary edema: Secondary | ICD-10-CM

## 2016-12-26 DIAGNOSIS — R0902 Hypoxemia: Secondary | ICD-10-CM | POA: Diagnosis not present

## 2016-12-26 DIAGNOSIS — J810 Acute pulmonary edema: Secondary | ICD-10-CM | POA: Diagnosis not present

## 2016-12-26 DIAGNOSIS — E669 Obesity, unspecified: Secondary | ICD-10-CM | POA: Diagnosis not present

## 2016-12-26 HISTORY — DX: Cutaneous abscess of limb, unspecified: L02.419

## 2016-12-26 HISTORY — DX: Cutaneous abscess of limb, unspecified: L03.119

## 2016-12-26 HISTORY — DX: Heart failure, unspecified: I50.9

## 2016-12-26 HISTORY — DX: Atherosclerotic heart disease of native coronary artery without angina pectoris: I25.10

## 2016-12-26 LAB — URINALYSIS, MICROSCOPIC (REFLEX)
BACTERIA UA: NONE SEEN
Squamous Epithelial / LPF: NONE SEEN

## 2016-12-26 LAB — TROPONIN I
TROPONIN I: 0.05 ng/mL — AB (ref <0.03)
TROPONIN I: 0.06 ng/mL — AB (ref <0.03)
Troponin I: 0.07 ng/mL (ref <0.03)

## 2016-12-26 LAB — POCT I-STAT 3, VENOUS BLOOD GAS (G3P V)
ACID-BASE DEFICIT: 3 mmol/L — AB (ref 0.0–2.0)
Bicarbonate: 24.1 mmol/L (ref 20.0–28.0)
O2 Saturation: 51 %
TCO2: 26 mmol/L (ref 0–100)
pCO2, Ven: 49.6 mmHg (ref 44.0–60.0)
pH, Ven: 7.294 (ref 7.250–7.430)
pO2, Ven: 30 mmHg — CL (ref 32.0–45.0)

## 2016-12-26 LAB — BLOOD GAS, ARTERIAL
ACID-BASE DEFICIT: 3.4 mmol/L — AB (ref 0.0–2.0)
Acid-base deficit: 1.2 mmol/L (ref 0.0–2.0)
Bicarbonate: 22.5 mmol/L (ref 20.0–28.0)
Bicarbonate: 23.8 mmol/L (ref 20.0–28.0)
DRAWN BY: 252031
Delivery systems: POSITIVE
Drawn by: 39899
EXPIRATORY PAP: 6
FIO2: 55
FIO2: 100
Inspiratory PAP: 12
LHR: 22 {breaths}/min
MODE: POSITIVE
O2 SAT: 97.3 %
O2 SAT: 99.4 %
PEEP: 5 cmH2O
PH ART: 7.265 — AB (ref 7.350–7.450)
PH ART: 7.335 — AB (ref 7.350–7.450)
PO2 ART: 113 mmHg — AB (ref 83.0–108.0)
Patient temperature: 98.6
Patient temperature: 98.6
RATE: 12 resp/min
VT: 660 mL
pCO2 arterial: 51.2 mmHg — ABNORMAL HIGH (ref 32.0–48.0)
pCO2 arterial: 46 mmHg (ref 32.0–48.0)
pO2, Arterial: 253 mmHg — ABNORMAL HIGH (ref 83.0–108.0)

## 2016-12-26 LAB — URINALYSIS, ROUTINE W REFLEX MICROSCOPIC

## 2016-12-26 LAB — SODIUM, URINE, RANDOM: SODIUM UR: 43 mmol/L

## 2016-12-26 LAB — CBC WITH DIFFERENTIAL/PLATELET
Basophils Absolute: 0 10*3/uL (ref 0.0–0.1)
Basophils Relative: 0 %
EOS ABS: 0.1 10*3/uL (ref 0.0–0.7)
Eosinophils Relative: 1 %
HEMATOCRIT: 39.4 % (ref 39.0–52.0)
HEMOGLOBIN: 12 g/dL — AB (ref 13.0–17.0)
LYMPHS ABS: 1.5 10*3/uL (ref 0.7–4.0)
Lymphocytes Relative: 10 %
MCH: 30.1 pg (ref 26.0–34.0)
MCHC: 30.5 g/dL (ref 30.0–36.0)
MCV: 98.7 fL (ref 78.0–100.0)
MONOS PCT: 9 %
Monocytes Absolute: 1.4 10*3/uL — ABNORMAL HIGH (ref 0.1–1.0)
NEUTROS ABS: 12.5 10*3/uL — AB (ref 1.7–7.7)
NEUTROS PCT: 80 %
Platelets: 234 10*3/uL (ref 150–400)
RBC: 3.99 MIL/uL — ABNORMAL LOW (ref 4.22–5.81)
RDW: 16.6 % — ABNORMAL HIGH (ref 11.5–15.5)
WBC: 15.5 10*3/uL — ABNORMAL HIGH (ref 4.0–10.5)

## 2016-12-26 LAB — PROTIME-INR
INR: 1.24
Prothrombin Time: 15.7 seconds — ABNORMAL HIGH (ref 11.4–15.2)

## 2016-12-26 LAB — GLUCOSE, CAPILLARY
GLUCOSE-CAPILLARY: 102 mg/dL — AB (ref 65–99)
GLUCOSE-CAPILLARY: 124 mg/dL — AB (ref 65–99)
Glucose-Capillary: 113 mg/dL — ABNORMAL HIGH (ref 65–99)
Glucose-Capillary: 122 mg/dL — ABNORMAL HIGH (ref 65–99)
Glucose-Capillary: 112 mg/dL — ABNORMAL HIGH (ref 65–99)

## 2016-12-26 LAB — COMPREHENSIVE METABOLIC PANEL
ALBUMIN: 3.5 g/dL (ref 3.5–5.0)
ALK PHOS: 63 U/L (ref 38–126)
ALT: 19 U/L (ref 17–63)
AST: 23 U/L (ref 15–41)
Anion gap: 9 (ref 5–15)
BUN: 89 mg/dL — ABNORMAL HIGH (ref 6–20)
CO2: 25 mmol/L (ref 22–32)
CREATININE: 3.64 mg/dL — AB (ref 0.61–1.24)
Calcium: 8.8 mg/dL — ABNORMAL LOW (ref 8.9–10.3)
Chloride: 103 mmol/L (ref 101–111)
GFR calc Af Amer: 18 mL/min — ABNORMAL LOW (ref >60)
GFR calc non Af Amer: 15 mL/min — ABNORMAL LOW (ref >60)
GLUCOSE: 115 mg/dL — AB (ref 65–99)
Potassium: >7.5 mmol/L (ref 3.5–5.1)
Sodium: 137 mmol/L (ref 135–145)
TOTAL PROTEIN: 6.6 g/dL (ref 6.5–8.1)
Total Bilirubin: 0.4 mg/dL (ref 0.3–1.2)

## 2016-12-26 LAB — BASIC METABOLIC PANEL
ANION GAP: 8 (ref 5–15)
BUN: 70 mg/dL — ABNORMAL HIGH (ref 6–20)
CHLORIDE: 99 mmol/L — AB (ref 101–111)
CO2: 26 mmol/L (ref 22–32)
Calcium: 8 mg/dL — ABNORMAL LOW (ref 8.9–10.3)
Creatinine, Ser: 2.9 mg/dL — ABNORMAL HIGH (ref 0.61–1.24)
GFR calc non Af Amer: 20 mL/min — ABNORMAL LOW (ref >60)
GFR, EST AFRICAN AMERICAN: 24 mL/min — AB (ref >60)
Glucose, Bld: 125 mg/dL — ABNORMAL HIGH (ref 65–99)
Potassium: 5.7 mmol/L — ABNORMAL HIGH (ref 3.5–5.1)
Sodium: 133 mmol/L — ABNORMAL LOW (ref 135–145)

## 2016-12-26 LAB — BRAIN NATRIURETIC PEPTIDE: B Natriuretic Peptide: 412.4 pg/mL — ABNORMAL HIGH (ref 0.0–100.0)

## 2016-12-26 LAB — RENAL FUNCTION PANEL
ALBUMIN: 3 g/dL — AB (ref 3.5–5.0)
Anion gap: 9 (ref 5–15)
BUN: 86 mg/dL — AB (ref 6–20)
CO2: 26 mmol/L (ref 22–32)
Calcium: 8.5 mg/dL — ABNORMAL LOW (ref 8.9–10.3)
Chloride: 102 mmol/L (ref 101–111)
Creatinine, Ser: 3.41 mg/dL — ABNORMAL HIGH (ref 0.61–1.24)
GFR calc Af Amer: 19 mL/min — ABNORMAL LOW (ref >60)
GFR calc non Af Amer: 17 mL/min — ABNORMAL LOW (ref >60)
GLUCOSE: 156 mg/dL — AB (ref 65–99)
PHOSPHORUS: 5.8 mg/dL — AB (ref 2.5–4.6)
POTASSIUM: 6.7 mmol/L — AB (ref 3.5–5.1)
SODIUM: 137 mmol/L (ref 135–145)

## 2016-12-26 LAB — LACTIC ACID, PLASMA
LACTIC ACID, VENOUS: 1.5 mmol/L (ref 0.5–1.9)
Lactic Acid, Venous: 1.6 mmol/L (ref 0.5–1.9)

## 2016-12-26 LAB — MAGNESIUM: Magnesium: 3.2 mg/dL — ABNORMAL HIGH (ref 1.7–2.4)

## 2016-12-26 LAB — MRSA PCR SCREENING: MRSA by PCR: NEGATIVE

## 2016-12-26 LAB — PROCALCITONIN: Procalcitonin: 0.11 ng/mL

## 2016-12-26 LAB — PHOSPHORUS: Phosphorus: 7.6 mg/dL — ABNORMAL HIGH (ref 2.5–4.6)

## 2016-12-26 MED ORDER — CHLORHEXIDINE GLUCONATE 0.12 % MT SOLN
15.0000 mL | Freq: Two times a day (BID) | OROMUCOSAL | Status: DC
  Administered 2016-12-26: 15 mL via OROMUCOSAL

## 2016-12-26 MED ORDER — SODIUM CHLORIDE 0.9% FLUSH
10.0000 mL | INTRAVENOUS | Status: DC | PRN
  Administered 2016-12-26: 10 mL via INTRAVENOUS
  Administered 2016-12-28: 20 mL via INTRAVENOUS
  Filled 2016-12-26 (×2): qty 10

## 2016-12-26 MED ORDER — ALBUTEROL SULFATE (2.5 MG/3ML) 0.083% IN NEBU: 2.5000 mg | INHALATION_SOLUTION | RESPIRATORY_TRACT | Status: DC | PRN

## 2016-12-26 MED ORDER — MIDAZOLAM HCL 2 MG/2ML IJ SOLN
INTRAMUSCULAR | Status: AC
  Filled 2016-12-26: qty 2

## 2016-12-26 MED ORDER — PRISMASOL BGK 0/2.5 32-2.5 MEQ/L IV SOLN
INTRAVENOUS | Status: DC
  Administered 2016-12-26 – 2016-12-28 (×4): via INTRAVENOUS_CENTRAL
  Filled 2016-12-26 (×6): qty 5000

## 2016-12-26 MED ORDER — FENTANYL CITRATE (PF) 100 MCG/2ML IJ SOLN
50.0000 ug | Freq: Once | INTRAMUSCULAR | Status: AC
  Administered 2016-12-26: 50 ug via INTRAVENOUS
  Filled 2016-12-26: qty 2

## 2016-12-26 MED ORDER — ROCURONIUM BROMIDE 50 MG/5ML IV SOLN
1.0000 mg/kg | Freq: Once | INTRAVENOUS | Status: AC
Start: 2016-12-26 — End: 2016-12-26
  Administered 2016-12-26: 100 mg via INTRAVENOUS

## 2016-12-26 MED ORDER — SODIUM BICARBONATE 8.4 % IV SOLN
INTRAVENOUS | Status: AC
  Filled 2016-12-26: qty 50

## 2016-12-26 MED ORDER — FENTANYL BOLUS VIA INFUSION
25.0000 ug | INTRAVENOUS | Status: DC | PRN
  Filled 2016-12-26: qty 25

## 2016-12-26 MED ORDER — ORAL CARE MOUTH RINSE
15.0000 mL | Freq: Four times a day (QID) | OROMUCOSAL | Status: DC
  Administered 2016-12-26 – 2017-01-02 (×25): 15 mL via OROMUCOSAL

## 2016-12-26 MED ORDER — VECURONIUM BROMIDE 10 MG IV SOLR
INTRAVENOUS | Status: AC
  Filled 2016-12-26: qty 10

## 2016-12-26 MED ORDER — HEPARIN SODIUM (PORCINE) 5000 UNIT/ML IJ SOLN
5000.0000 [IU] | Freq: Three times a day (TID) | INTRAMUSCULAR | Status: DC
  Administered 2016-12-26 – 2016-12-28 (×7): 5000 [IU] via SUBCUTANEOUS
  Filled 2016-12-26 (×7): qty 1

## 2016-12-26 MED ORDER — MIDAZOLAM HCL 2 MG/2ML IJ SOLN
1.0000 mg | INTRAMUSCULAR | Status: AC | PRN
  Administered 2016-12-26 – 2016-12-27 (×3): 1 mg via INTRAVENOUS
  Filled 2016-12-26 (×4): qty 2

## 2016-12-26 MED ORDER — SODIUM CHLORIDE 0.9 % FOR CRRT
INTRAVENOUS_CENTRAL | Status: DC | PRN
  Administered 2016-12-28: 999 mL via INTRAVENOUS_CENTRAL
  Filled 2016-12-26 (×2): qty 1000

## 2016-12-26 MED ORDER — INSULIN ASPART 100 UNIT/ML ~~LOC~~ SOLN
2.0000 [IU] | SUBCUTANEOUS | Status: DC
  Administered 2016-12-26 – 2016-12-29 (×9): 2 [IU] via SUBCUTANEOUS
  Administered 2016-12-30 (×2): 4 [IU] via SUBCUTANEOUS
  Administered 2016-12-30 (×2): 2 [IU] via SUBCUTANEOUS
  Administered 2016-12-30 – 2016-12-31 (×2): 4 [IU] via SUBCUTANEOUS
  Administered 2016-12-31: 2 [IU] via SUBCUTANEOUS
  Administered 2016-12-31 – 2017-01-01 (×10): 4 [IU] via SUBCUTANEOUS
  Administered 2017-01-01: 6 [IU] via SUBCUTANEOUS
  Administered 2017-01-02: 2 [IU] via SUBCUTANEOUS

## 2016-12-26 MED ORDER — ASPIRIN 81 MG PO CHEW
324.0000 mg | CHEWABLE_TABLET | ORAL | Status: AC
  Administered 2016-12-26: 324 mg via ORAL
  Filled 2016-12-26: qty 4

## 2016-12-26 MED ORDER — MIDAZOLAM HCL 2 MG/2ML IJ SOLN
8.0000 mg | Freq: Once | INTRAMUSCULAR | Status: AC
  Administered 2016-12-26: 8 mg via INTRAVENOUS

## 2016-12-26 MED ORDER — SODIUM CHLORIDE 0.9% FLUSH: 3.0000 mL | INTRAVENOUS | Status: DC | PRN

## 2016-12-26 MED ORDER — HEPARIN SOD (PORK) LOCK FLUSH 100 UNIT/ML IV SOLN: 1000.0000 [IU] | Freq: Once | INTRAVENOUS | Status: DC

## 2016-12-26 MED ORDER — DEXTROSE 5 % IV SOLN: 1.0000 g | INTRAVENOUS | Status: DC

## 2016-12-26 MED ORDER — SODIUM CHLORIDE 0.9% FLUSH
10.0000 mL | Freq: Two times a day (BID) | INTRAVENOUS | Status: DC
  Administered 2016-12-27 – 2017-01-02 (×7): 10 mL via INTRAVENOUS

## 2016-12-26 MED ORDER — FENTANYL CITRATE (PF) 100 MCG/2ML IJ SOLN
INTRAMUSCULAR | Status: AC
  Administered 2016-12-26: 400 ug
  Filled 2016-12-26: qty 2

## 2016-12-26 MED ORDER — PANTOPRAZOLE SODIUM 40 MG IV SOLR
40.0000 mg | Freq: Every day | INTRAVENOUS | Status: DC
  Administered 2016-12-26 – 2017-01-02 (×8): 40 mg via INTRAVENOUS
  Filled 2016-12-26 (×8): qty 40

## 2016-12-26 MED ORDER — SODIUM BICARBONATE 8.4 % IV SOLN
100.0000 meq | Freq: Once | INTRAVENOUS | Status: AC
  Administered 2016-12-26: 100 meq via INTRAVENOUS
  Filled 2016-12-26: qty 50

## 2016-12-26 MED ORDER — ONDANSETRON HCL 4 MG/2ML IJ SOLN: 4.0000 mg | Freq: Four times a day (QID) | INTRAMUSCULAR | Status: DC | PRN

## 2016-12-26 MED ORDER — FENTANYL CITRATE (PF) 100 MCG/2ML IJ SOLN
INTRAMUSCULAR | Status: AC
  Filled 2016-12-26: qty 2

## 2016-12-26 MED ORDER — SODIUM CHLORIDE 0.9% FLUSH
3.0000 mL | Freq: Two times a day (BID) | INTRAVENOUS | Status: DC
  Administered 2016-12-26 – 2017-01-02 (×12): 3 mL via INTRAVENOUS

## 2016-12-26 MED ORDER — VECURONIUM BROMIDE 10 MG IV SOLR
10.0000 mg | Freq: Once | INTRAVENOUS | Status: DC
Start: 2016-12-26 — End: 2016-12-28

## 2016-12-26 MED ORDER — ASPIRIN 81 MG PO CHEW
CHEWABLE_TABLET | ORAL | Status: AC
  Filled 2016-12-26: qty 1

## 2016-12-26 MED ORDER — DEXTROSE 5 % IV SOLN
2.0000 g | Freq: Once | INTRAVENOUS | Status: AC
  Administered 2016-12-26: 2 g via INTRAVENOUS
  Filled 2016-12-26: qty 2

## 2016-12-26 MED ORDER — FENTANYL CITRATE (PF) 100 MCG/2ML IJ SOLN: 400.0000 ug | Freq: Once | INTRAMUSCULAR | Status: DC

## 2016-12-26 MED ORDER — CLOPIDOGREL BISULFATE 75 MG PO TABS
75.0000 mg | ORAL_TABLET | Freq: Every day | ORAL | Status: DC
  Administered 2016-12-28 – 2017-01-05 (×8): 75 mg via ORAL
  Filled 2016-12-26 (×8): qty 1

## 2016-12-26 MED ORDER — HEPARIN SODIUM (PORCINE) 1000 UNIT/ML DIALYSIS
1000.0000 [IU] | INTRAMUSCULAR | Status: DC | PRN
  Administered 2016-12-26: 5400 [IU] via INTRAVENOUS_CENTRAL
  Administered 2016-12-30: 3000 [IU] via INTRAVENOUS_CENTRAL
  Filled 2016-12-26: qty 6
  Filled 2016-12-26: qty 3
  Filled 2016-12-26: qty 1
  Filled 2016-12-26 (×3): qty 6
  Filled 2016-12-26: qty 2

## 2016-12-26 MED ORDER — PRISMASOL BGK 0/2.5 32-2.5 MEQ/L IV SOLN
INTRAVENOUS | Status: DC
  Administered 2016-12-26 – 2016-12-28 (×3): via INTRAVENOUS_CENTRAL
  Filled 2016-12-26 (×4): qty 5000

## 2016-12-26 MED ORDER — ACETAMINOPHEN 325 MG PO TABS: 650.0000 mg | ORAL_TABLET | ORAL | Status: DC | PRN

## 2016-12-26 MED ORDER — DEXTROSE 5 % IV SOLN
2.0000 g | Freq: Two times a day (BID) | INTRAVENOUS | Status: DC
  Administered 2016-12-26 – 2016-12-28 (×5): 2 g via INTRAVENOUS
  Filled 2016-12-26 (×6): qty 2

## 2016-12-26 MED ORDER — PRISMASOL BGK 0/2.5 32-2.5 MEQ/L IV SOLN
INTRAVENOUS | Status: DC
  Administered 2016-12-26 – 2016-12-27 (×6): via INTRAVENOUS_CENTRAL
  Filled 2016-12-26 (×15): qty 5000

## 2016-12-26 MED ORDER — SODIUM BICARBONATE 8.4 % IV SOLN
INTRAVENOUS | Status: DC
  Administered 2016-12-26 (×2): via INTRAVENOUS
  Filled 2016-12-26 (×3): qty 150

## 2016-12-26 MED ORDER — SODIUM CHLORIDE 0.9 % IV SOLN
250.0000 mL | INTRAVENOUS | Status: DC | PRN
  Administered 2016-12-27: 10 mL/h via INTRAVENOUS
  Administered 2016-12-30 – 2017-01-02 (×2): 250 mL via INTRAVENOUS

## 2016-12-26 MED ORDER — SODIUM CHLORIDE 0.9 % IV SOLN
INTRAVENOUS | Status: DC | PRN
  Administered 2016-12-31: 20:00:00 via INTRA_ARTERIAL

## 2016-12-26 MED ORDER — SODIUM CHLORIDE 0.9 % IV SOLN
0.0000 ug/min | INTRAVENOUS | Status: DC
  Administered 2016-12-26: 5 ug/min via INTRAVENOUS
  Administered 2016-12-26: 90 ug/min via INTRAVENOUS
  Administered 2016-12-26 (×2): 150 ug/min via INTRAVENOUS
  Filled 2016-12-26 (×4): qty 1

## 2016-12-26 MED ORDER — VANCOMYCIN HCL 10 G IV SOLR
1250.0000 mg | INTRAVENOUS | Status: DC
  Administered 2016-12-27 – 2016-12-28 (×2): 1250 mg via INTRAVENOUS
  Filled 2016-12-26 (×2): qty 1250

## 2016-12-26 MED ORDER — ETOMIDATE 2 MG/ML IV SOLN
0.3000 mg/kg | Freq: Once | INTRAVENOUS | Status: AC
  Administered 2016-12-26: 38.88 mg via INTRAVENOUS

## 2016-12-26 MED ORDER — MIDAZOLAM HCL 2 MG/2ML IJ SOLN
1.0000 mg | INTRAMUSCULAR | Status: DC | PRN
  Administered 2016-12-27: 1 mg via INTRAVENOUS
  Filled 2016-12-26: qty 2

## 2016-12-26 MED ORDER — ASPIRIN 300 MG RE SUPP: 300.0000 mg | RECTAL | Status: AC

## 2016-12-26 MED ORDER — SODIUM CHLORIDE 0.9 % IV SOLN
0.0000 ug/min | INTRAVENOUS | Status: DC
  Administered 2016-12-27: 80 ug/min via INTRAVENOUS
  Administered 2016-12-27 – 2016-12-28 (×3): 85 ug/min via INTRAVENOUS
  Administered 2016-12-28: 40 ug/min via INTRAVENOUS
  Administered 2016-12-29: 15 ug/min via INTRAVENOUS
  Filled 2016-12-26 (×7): qty 4

## 2016-12-26 MED ORDER — ENOXAPARIN SODIUM 30 MG/0.3ML ~~LOC~~ SOLN: 30.0000 mg | Freq: Every day | SUBCUTANEOUS | Status: DC

## 2016-12-26 MED ORDER — DOCUSATE SODIUM 50 MG/5ML PO LIQD
100.0000 mg | Freq: Two times a day (BID) | ORAL | Status: DC | PRN
  Filled 2016-12-26: qty 10

## 2016-12-26 MED ORDER — PATIROMER SORBITEX CALCIUM 8.4 G PO PACK
25.2000 g | PACK | Freq: Every day | ORAL | Status: DC
  Administered 2016-12-26: 25.2 g via ORAL
  Filled 2016-12-26 (×2): qty 4

## 2016-12-26 MED ORDER — VANCOMYCIN HCL 10 G IV SOLR
2000.0000 mg | Freq: Once | INTRAVENOUS | Status: AC
  Administered 2016-12-26: 2000 mg via INTRAVENOUS
  Filled 2016-12-26: qty 2000

## 2016-12-26 MED ORDER — FENTANYL 2500MCG IN NS 250ML (10MCG/ML) PREMIX INFUSION
25.0000 ug/h | INTRAVENOUS | Status: DC
Start: 2016-12-26 — End: 2016-12-31
  Administered 2016-12-26: 100 ug/h via INTRAVENOUS
  Administered 2016-12-26 – 2016-12-27 (×2): 200 ug/h via INTRAVENOUS
  Administered 2016-12-27: 150 ug/h via INTRAVENOUS
  Administered 2016-12-28 – 2016-12-31 (×4): 100 ug/h via INTRAVENOUS
  Filled 2016-12-26 (×8): qty 250

## 2016-12-26 MED ORDER — ORAL CARE MOUTH RINSE: 15.0000 mL | Freq: Two times a day (BID) | OROMUCOSAL | Status: DC

## 2016-12-26 MED ORDER — CHLORHEXIDINE GLUCONATE 0.12% ORAL RINSE (MEDLINE KIT)
15.0000 mL | Freq: Two times a day (BID) | OROMUCOSAL | Status: DC
  Administered 2016-12-26 – 2017-01-01 (×14): 15 mL via OROMUCOSAL

## 2016-12-26 NOTE — Progress Notes (Signed)
ABG ordered for patient, however venous sample obtained.  MD is aware.  No further instructions at this time.  Will continue to monitor.   Ref. Range 12/26/2016 10:26  Sample type Unknown VENOUS  pH, Ven Latest Ref Range: 7.250 - 7.430  7.294  pCO2, Ven Latest Ref Range: 44.0 - 60.0 mmHg 49.6  pO2, Ven Latest Ref Range: 32.0 - 45.0 mmHg 30.0 (LL)  TCO2 Latest Ref Range: 0 - 100 mmol/L 26  Acid-base deficit Latest Ref Range: 0.0 - 2.0 mmol/L 3.0 (H)  Bicarbonate Latest Ref Range: 20.0 - 28.0 mmol/L 24.1  O2 Saturation Latest Units: % 51.0  Patient temperature Unknown 98.6 F  Collection site Unknown RADIAL, ALLEN'S T.Marland KitchenMarland Kitchen

## 2016-12-26 NOTE — H&P (Signed)
PULMONARY / CRITICAL CARE MEDICINE   Name: Christian Garner MRN: [id number] DOB: Aug 31, 1946    ADMISSION DATE:  12/26/2016 CONSULTATION DATE:  12/26/16  REFERRING MD:  Tilda Burrow  CHIEF COMPLAINT:  Hypoxic respiratory failure, acute kidney failure  HISTORY OF PRESENT ILLNESS:   Christian Garner is a 71 y/o man with PMH of CHF with EF of ~30%, CAD with multiple stents, most recently 6/17 (DES), DM2 on metformin and januvia, LE edema and cellulitis currently on bactrim.  He was recently hospitalized at [facility] for CHF exacerbation and LE cellulitis, discharged on March 1st.  He was sent home on lasix, metolazone (weekly), bactrim, lisinopril, carvedilol and metformin.  He continued to have significant orthopnea after discharge and his family reports he has been unable to sleep because he has been unable to lay flat.  He went to [name] ED on 12/25/16 with worsening shortness of breath and was found to have an SpO2 in the low to mid 80s on room air as well as a potassium of 8.5, Cr of 3.7 increased from 1.5 at discharge and a right sided pleural effusion.  His ABG was 7.15/54 and he was started on bipap and a bicarb gtt.  He was also given albuterol, calcium, insulin and glucose and kayexalate for his hyperkalemia.  His shortness of breath improved on BiPap and he was transferred to [facility] for further care.   PAST MEDICAL HISTORY :  He  has a past medical history of CAD (coronary artery disease); Cellulitis and abscess of lower extremity; CHF (congestive heart failure) ([place]); and DM (diabetes mellitus) ([place]).  PAST SURGICAL HISTORY: He  has a past surgical history that includes Coronary angioplasty with stent.  No Known Allergies  No current facility-administered medications on file prior to encounter.    No current outpatient prescriptions on file prior to encounter.    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  SUBJECTIVE:  71 y/o man with hypoxic respiratory failure, hyperkalemia, AKI and leukocytosis.  VITAL SIGNS: BP 94/61   Pulse 71   Temp 97 F (36.1 C) (Axillary)   Resp 19   Ht 6\' 2"  (1.88 m)   Wt 129.6 kg (285 lb 11.5 oz)   SpO2 100%   BMI 36.68 kg/m   HEMODYNAMICS:    VENTILATOR SETTINGS:    INTAKE / OUTPUT: No intake/output data recorded.  PHYSICAL EXAMINATION: General:  Laying in bed, on bipap, appears older than stated age Neuro:  Alert and oriented x3 HEENT:  PERRL, EOMI, bipap mask on Cardiovascular:  NRRR, II/VI systolic murmur, no gallop, no rub Lungs:  Diminished breath sounds on right, no wheezes Abdomen:  Distended, soft, non-tender Musculoskeletal:  4+ LE pitting edema Skin:  LE erythematous from mid shin to ankles, scaly skin, healing ulcers.   LABS:  BMET No results for input(s): NA, K, CL, CO2, BUN, CREATININE, GLUCOSE in the last 168 hours.  Electrolytes No results for input(s): CALCIUM, MG, PHOS in the last 168 hours.  CBC No results for input(s): WBC, HGB, HCT, PLT in the last 168 hours.  Coag's No results for input(s): APTT, INR in the last 168 hours.  Sepsis Markers No results for input(s): LATICACIDVEN, PROCALCITON, O2SATVEN in the last 168 hours.  ABG  Recent Labs Lab 12/26/16 0355  PHART 7.265*  PCO2ART  51.2*  PO2ART 113*    Liver Enzymes No results for input(s): AST, ALT, ALKPHOS, BILITOT, ALBUMIN in the last 168 hours.  Cardiac Enzymes No results for input(s): TROPONINI, PROBNP in the last 168 hours.  Glucose No results for input(s): GLUCAP in the last 168 hours.  Imaging Dg Chest Port 1 View  Result Date: 12/26/2016 CLINICAL DATA:  Shortness of breath EXAM: PORTABLE CHEST 1 VIEW COMPARISON:  12/25/2016 FINDINGS: Cardiac enlargement. Visualized pulmonary vascularity appears normal. Moderate size right pleural effusion with atelectasis or  infiltration in the right lung. Appearances are similar to prior study, lying for differences in patient positioning. Left lung is clear and expanded. Calcification of the aorta. IMPRESSION: Moderate right pleural effusion with infiltration or atelectasis in the right lung. Changes are similar to prior study, lying for differences in patient positioning. Cardiac enlargement. Electronically Signed   By: Lucienne Capers M.D.   On: 12/26/2016 03:48     STUDIES:  CXR 12/26/16 - right sided pleural effusion  CULTURES: Urine, blood (at [place])  ANTIBIOTICS: Vanc 12/26/16 --> Cefepime 12/26/16 --->  SIGNIFICANT EVENTS:   LINES/TUBES:   DISCUSSION: 71 y/o presenting with hypoxic, hypercarbic respiratory failure, AKI, hyperkalemia and possible sepsis  ASSESSMENT / PLAN:  PULMONARY A: Respiratory failure Right sided effusion P:   BiPap - wean as able  CARDIOVASCULAR A:  CHF P:  Echo Still grossly volume overloaded but holding diuresis due to renal failure for now  RENAL A:   AKI Hyperkalemia hematuria P:   AKI and hyperkalemia likely due to lisinopril, bactrim, metformin + lasix and metolazone FeUrea labs pending Renal ultrasound Hold lisinopril, bactrim and metformin Hematuria likely secondary to traumatic foley placement - follow for now - urology consult if not improving  HEMATOLOGIC A:   Chronic anemia P:  On iron supplements - hold while NPO  INFECTIOUS A:   UA with 20-30WBC and trace LE LE cellulitis  P:   Vanc/cefepime pending culture results  ENDOCRINE A:   DM2   P:   SSI Hold metformin and januvia     FAMILY  - Updates: Family at bedside and updated on admission  - Inter-disciplinary family meet or Palliative Care meeting due by:  01/02/17  I spent 35 minutes of critical care time in the care of this patient seperate from procedures which are documented elsewhere   Pulmonary and [place] Pager: [phone]  12/26/2016, 4:29 AM

## 2016-12-26 NOTE — Progress Notes (Signed)
Called MD to verify running bicarb drip and neo through PIV. MD ok with both running through PIV. Will cont to monitor and assess pt

## 2016-12-26 NOTE — Procedures (Signed)
Central Venous Catheter Insertion Procedure Note CRYSTAL ELLWOOD [id number] 04/18/1946  Procedure: Insertion of Central Venous Catheter Indications: trialysis HD cath righ ij  Procedure Details Consent: Risks of procedure as well as the alternatives and risks of each were explained to the (patient/caregiver).  Consent for procedure obtained.erbal consent from wife and patient Time Out: Verified patient identification, verified procedure, site/side was marked, verified correct patient position, special equipment/implants available, medications/allergies/relevent history reviewed, required imaging and test results available.  Performed  Maximum sterile technique was used including antiseptics, cap, gloves, gown, hand hygiene, mask and sheet. Skin prep: Chlorhexidine; local anesthetic administered A antimicrobial bonded/coated triple lumen catheter was placed in the right internal jugular vein using the Seldinger technique.  Evaluation Blood flow see procedure details Complications: No apparent complications Patient did tolerate procedure well. Chest X-ray ordered to verify placement.  CXR: pending. \ Details: Rt Ij cannulated. Subsequently first dilation wnet well but 2nd dilatation guide wire kinked. Dr Loralie Champagne help sought. Could not work around [employer] so procedure abandoned. Dr Elmarie Shiley renal to try femoral approach  Dontavius Keim 12/26/2016, 2:27 PM

## 2016-12-26 NOTE — Progress Notes (Signed)
Bladder scan done due to only blood coming out of foley and blood leaking around foley, Bladder scan results were Donzetta Kohut, CCM MD notified

## 2016-12-26 NOTE — Progress Notes (Signed)
Patient taken off of Bipap and placed on 4L nasal cannula.  Currently tolerating well.  Will continue to monitor. 

## 2016-12-26 NOTE — Procedures (Signed)
Intubation Procedure Note Christian Garner [id number] [birth year]  Procedure: Intubation Indications: Respiratory insufficiency  Procedure Details Consent: Risks of procedure as well as the alternatives and risks of each were explained to the (patient/caregiver).  Consent for procedure obtained. verbally rom wife over phone due to emergent medical needs. Patient also verbally consetned Time Out: Verified patient identification, verified procedure, site/side was marked, verified correct patient position, special equipment/implants available, medications/allergies/relevent history reviewed, required imaging and test results available.  Performed  Maximum sterile technique was used including cap, gloves, gown, hand hygiene and mask.  MAC  glidecsoce used - easy intubatin  Evaluation Hemodynamic Status: Persistent hypotension treated with pressors; O2 sats: stable throughout Patient's Current Condition: stable Complications: No apparent complications Patient did tolerate procedure well. Chest X-ray ordered to verify placement.  CXR: pending.   Christian Garner 12/26/2016

## 2016-12-26 NOTE — Consult Note (Signed)
Urology Consult  Referring physician: Dr. Titus Mould Reason for referral: gross hematuria  Chief Complaint: gross hematuria  History of Present Illness: [name] is a 71yo with a hx of CHR, CAD, DMII, and BPH admitted with CHF exacerbation. He had a foley placed during this admission and he was noted to have gross hematuria this morning. He denies any suprapubic pain. No issues per nursing staff placing the foley. He denies any significant LUTS at baseline. No history of gross hematuria. No history of nephrolithiasis. Currently the patient has a 16 french foley with dark bloody output.   Past Medical History:  Diagnosis Date  . CAD (coronary artery disease)   . Cellulitis and abscess of lower extremity   . CHF (congestive heart failure) ([facility])    EF 30%  . DM (diabetes mellitus) ([place])    Past Surgical History:  Procedure Laterality Date  . CORONARY ANGIOPLASTY WITH STENT PLACEMENT      Medications: I have reviewed the patient's current medications. Allergies: No Known Allergies  No family history on file. Social History:  has no tobacco, alcohol, and drug history on file.  Review of Systems  Constitutional: Positive for malaise/fatigue.  Respiratory: Positive for shortness of breath.   Gastrointestinal: Positive for abdominal pain.  Genitourinary: Positive for hematuria.  Neurological: Positive for weakness.  All other systems reviewed and are negative.   Physical Exam:  Vital signs in last 24 hours: Temp:  [97 F (36.1 C)-98.1 F (36.7 C)] 97.6 F (36.4 C) (03/17 0853) Pulse Rate:  [59-137] 79 (03/17 1100) Resp:  [14-29] 29 (03/17 1100) BP: (68-124)/(30-98) 110/51 (03/17 1100) SpO2:  [88 %-100 %] 95 % (03/17 1100) FiO2 (%):  [55 %] 55 % (03/17 0830) Weight:  [129.6 kg (285 lb 11.5 oz)] 129.6 kg (285 lb 11.5 oz) (03/17 0225) Physical Exam  Constitutional: He is oriented to person, place, and time. He appears well-developed and well-nourished.  HENT:  Head:  Normocephalic and atraumatic.  Eyes: EOM are normal. Pupils are equal, round, and reactive to light.  Neck: Normal range of motion. No thyromegaly present.  Cardiovascular: Normal rate and regular rhythm.   Respiratory: Effort normal. No respiratory distress.  GI: Soft. He exhibits no distension. Hernia confirmed negative in the right inguinal area and confirmed negative in the left inguinal area.  Genitourinary: Testes normal and penis normal. Right testis shows no mass, no swelling and no tenderness. Right testis is descended. Cremasteric reflex is not absent on the right side. Left testis shows no mass, no swelling and no tenderness. Left testis is descended. Cremasteric reflex is not absent on the left side. Uncircumcised.  Musculoskeletal: Normal range of motion. He exhibits no edema.  Lymphadenopathy:       Right: No inguinal adenopathy present.       Left: No inguinal adenopathy present.  Neurological: He is alert and oriented to person, place, and time.  Skin: Skin is warm and dry.  Psychiatric: He has a normal mood and affect. His behavior is normal. Judgment and thought content normal.    Laboratory Data:  Results for orders placed or performed during the hospital encounter of 12/26/16 (from the past 72 hour(s))  MRSA PCR Screening     Status: None   Collection Time: 12/26/16  2:44 AM  Result Value Ref Range   MRSA by PCR NEGATIVE NEGATIVE    Comment:        The GeneXpert MRSA Assay (FDA approved for NASAL specimens only), is one component of  a comprehensive MRSA colonization surveillance program. It is not intended to diagnose MRSA infection nor to guide or monitor treatment for MRSA infections.   Blood gas, arterial     Status: Abnormal   Collection Time: 12/26/16  3:55 AM  Result Value Ref Range   FIO2 55.00    Delivery systems BILEVEL POSITIVE AIRWAY PRESSURE    Mode BILEVEL POSITIVE AIRWAY PRESSURE    LHR 12 resp/min   Inspiratory PAP 12    Expiratory PAP 6     pH, Arterial 7.265 (L) 7.350 - 7.450   pCO2 arterial 51.2 (H) 32.0 - 48.0 mmHg   pO2, Arterial 113 (H) 83.0 - 108.0 mmHg   Bicarbonate 22.5 20.0 - 28.0 mmol/L   Acid-base deficit 3.4 (H) 0.0 - 2.0 mmol/L   O2 Saturation 97.3 %   Patient temperature 98.6    Collection site LEFT BRACHIAL    Drawn by [id number]    Sample type ARTERIAL DRAW    Allens test (pass/fail) PASS PASS  Glucose, capillary     Status: Abnormal   Collection Time: 12/26/16  4:33 AM  Result Value Ref Range   Glucose-Capillary 124 (H) 65 - 99 mg/dL   Comment 1 Capillary Specimen    Comment 2 Notify RN   Comprehensive metabolic panel     Status: Abnormal   Collection Time: 12/26/16  7:16 AM  Result Value Ref Range   Sodium 137 135 - 145 mmol/L   Potassium >7.5 (HH) 3.5 - 5.1 mmol/L    Comment: SLIGHT HEMOLYSIS CRITICAL RESULT CALLED TO, READ BACK BY AND VERIFIED WITH: [facility] RN @ [phone] 12/26/16 BY C.EDENS    Chloride 103 101 - 111 mmol/L   CO2 25 22 - 32 mmol/L   Glucose, Bld 115 (H) 65 - 99 mg/dL   BUN 89 (H) 6 - 20 mg/dL   Creatinine, Ser 3.64 (H) 0.61 - 1.24 mg/dL   Calcium 8.8 (L) 8.9 - 10.3 mg/dL   Total Protein 6.6 6.5 - 8.1 g/dL   Albumin 3.5 3.5 - 5.0 g/dL   AST 23 15 - 41 U/L   ALT 19 17 - 63 U/L   Alkaline Phosphatase 63 38 - 126 U/L   Total Bilirubin 0.4 0.3 - 1.2 mg/dL   GFR calc non Af Amer 15 (L) >60 mL/min   GFR calc Af Amer 18 (L) >60 mL/min    Comment: (NOTE) The eGFR has been calculated using the CKD EPI equation. This calculation has not been validated in all clinical situations. eGFR's persistently <60 mL/min signify possible Chronic Kidney Disease.    Anion gap 9 5 - 15  Magnesium     Status: Abnormal   Collection Time: 12/26/16  7:16 AM  Result Value Ref Range   Magnesium 3.2 (H) 1.7 - 2.4 mg/dL  Phosphorus     Status: Abnormal   Collection Time: 12/26/16  7:16 AM  Result Value Ref Range   Phosphorus 7.6 (H) 2.5 - 4.6 mg/dL  Troponin I     Status: Abnormal   Collection Time:  12/26/16  7:16 AM  Result Value Ref Range   Troponin I 0.05 (HH) <0.03 ng/mL    Comment: CRITICAL RESULT CALLED TO, READ BACK BY AND VERIFIED WITH: [facility] RN @ [phone] 12/26/16 BY C.EDENS   Lactic acid, plasma     Status: None   Collection Time: 12/26/16  7:16 AM  Result Value Ref Range   Lactic Acid, Venous 1.6 0.5 - 1.9 mmol/L  Brain natriuretic  peptide     Status: Abnormal   Collection Time: 12/26/16  7:16 AM  Result Value Ref Range   B Natriuretic Peptide 412.4 (H) 0.0 - 100.0 pg/mL  CBC WITH DIFFERENTIAL     Status: Abnormal   Collection Time: 12/26/16  7:16 AM  Result Value Ref Range   WBC 15.5 (H) 4.0 - 10.5 K/uL   RBC 3.99 (L) 4.22 - 5.81 MIL/uL   Hemoglobin 12.0 (L) 13.0 - 17.0 g/dL   HCT 39.4 39.0 - 52.0 %   MCV 98.7 78.0 - 100.0 fL   MCH 30.1 26.0 - 34.0 pg   MCHC 30.5 30.0 - 36.0 g/dL   RDW 16.6 (H) 11.5 - 15.5 %   Platelets 234 150 - 400 K/uL   Neutrophils Relative % 80 %   Neutro Abs 12.5 (H) 1.7 - 7.7 K/uL   Lymphocytes Relative 10 %   Lymphs Abs 1.5 0.7 - 4.0 K/uL   Monocytes Relative 9 %   Monocytes Absolute 1.4 (H) 0.1 - 1.0 K/uL   Eosinophils Relative 1 %   Eosinophils Absolute 0.1 0.0 - 0.7 K/uL   Basophils Relative 0 %   Basophils Absolute 0.0 0.0 - 0.1 K/uL  Protime-INR     Status: Abnormal   Collection Time: 12/26/16  7:16 AM  Result Value Ref Range   Prothrombin Time 15.7 (H) 11.4 - 15.2 seconds   INR 1.24   Glucose, capillary     Status: Abnormal   Collection Time: 12/26/16  8:46 AM  Result Value Ref Range   Glucose-Capillary 113 (H) 65 - 99 mg/dL   Comment 1 Capillary Specimen    Comment 2 Notify RN   I-STAT 3, venous blood gas (G3P V)     Status: Abnormal   Collection Time: 12/26/16 10:26 AM  Result Value Ref Range   pH, Ven 7.294 7.250 - 7.430   pCO2, Ven 49.6 44.0 - 60.0 mmHg   pO2, Ven 30.0 (LL) 32.0 - 45.0 mmHg   Bicarbonate 24.1 20.0 - 28.0 mmol/L   TCO2 26 0 - 100 mmol/L   O2 Saturation 51.0 %   Acid-base deficit 3.0 (H) 0.0 - 2.0  mmol/L   Patient temperature 98.6 F    Collection site RADIAL, ALLEN'S TEST ACCEPTABLE    Drawn by Operator    Sample type VENOUS    Comment NOTIFIED PHYSICIAN   Lactic acid, plasma     Status: None   Collection Time: 12/26/16 10:29 AM  Result Value Ref Range   Lactic Acid, Venous 1.5 0.5 - 1.9 mmol/L   Recent Results (from the past 240 hour(s))  MRSA PCR Screening     Status: None   Collection Time: 12/26/16  2:44 AM  Result Value Ref Range Status   MRSA by PCR NEGATIVE NEGATIVE Final    Comment:        The GeneXpert MRSA Assay (FDA approved for NASAL specimens only), is one component of a comprehensive MRSA colonization surveillance program. It is not intended to diagnose MRSA infection nor to guide or monitor treatment for MRSA infections.    Creatinine:  Recent Labs  12/26/16 0716  CREATININE 3.64*   Baseline Creatinine: unknown  Impression/Assessment:  70yo with ARF, gross hematuria  Plan:  1. Gross hematuria: the foley was irrigated and 100cc of clot was evacuated from the bladder. The urine then cleared. The hematuria was likely related to prostatic bleeding. Please flush foley prn for clots.   Nicolette Bang 12/26/2016, 11:46 AM

## 2016-12-26 NOTE — Progress Notes (Signed)
CRITICAL VALUE ALERT  Critical value received:  k >7.5, Cr 3.67, Troponin 0.05  Date of notification:  12/26/2016  Time of notification:  0840  Critical value read back:yes  Nurse who received alert:  Lily Kocher  MD notified (1st page):  Dr Nelda Marseille  Time of first page: 0840  MD notified (2nd page):  Time of second page:  Responding MD: Dr Nelda Marseille  Time MD responded: [phone]   No orders given at this time

## 2016-12-26 NOTE — Consult Note (Signed)
Reason for Consult: Acute kidney injury, hyperkalemia Referring Physician: Jennet Garner M.D.  HPI: 71 year old Caucasian man with past medical history significant for history of CAD, ischemic cardiomyopathy/ congestive heart failure (EF 30%), type 2 diabetes mellitus and recent cellulitis for which he was taking Bactrim. He possibly also has chronic kidney disease stage III with his creatinine 2 weeks ago being 1.5 (previous labs from May-June 2017 show a creatinine of 0.7-0.8). He was recently discharged from [facility] on 12/10/16 for CHF exacerbation on furosemide, weekly metolazone and resumption of his lisinopril. He continued to have worsening orthopnea/shortness of breath and presented to [facility] emergency room last night with intolerable shortness of breath. He was found to have acute on chronic renal failure with a creatinine of 3.7 and potassium of 8.5 following which he was transferred here. He also had evidence of volume overload/right pleural effusion. Temporizing measures have not quite helped lower his potassium.  Past Medical History:  Diagnosis Date  . CAD (coronary artery disease)   . Cellulitis and abscess of lower extremity   . CHF (congestive heart failure) (HCC)    EF 30%  . DM (diabetes mellitus) ([place])     Past Surgical History:  Procedure Laterality Date  . CORONARY ANGIOPLASTY WITH STENT PLACEMENT      No family history on file.  Social History:  has no tobacco, alcohol, and drug history on file.  Allergies: No Known Allergies  Medications:  Scheduled: . [START ON 12/27/2016] ceFEPime (MAXIPIME) IV  1 g Intravenous Q24H  . chlorhexidine  15 mL Mouth Rinse BID  . clopidogrel  75 mg Oral Daily  . heparin subcutaneous  5,000 Units Subcutaneous Q8H  . insulin aspart  2-6 Units Subcutaneous Q4H  . mouth rinse  15 mL Mouth Rinse q12n4p  . patiromer  25.2 g Oral Daily    BMP Latest Ref Rng & Units 12/26/2016  Glucose 65 - 99 mg/dL 115(H)  BUN 6 -  20 mg/dL 89(H)  Creatinine 0.61 - 1.24 mg/dL 3.64(H)  Sodium 135 - 145 mmol/L 137  Potassium 3.5 - 5.1 mmol/L >7.5(HH)  Chloride 101 - 111 mmol/L 103  CO2 22 - 32 mmol/L 25  Calcium 8.9 - 10.3 mg/dL 8.8(L)   CBC Latest Ref Rng & Units 12/26/2016  WBC 4.0 - 10.5 K/uL 15.5(H)  Hemoglobin 13.0 - 17.0 g/dL 12.0(L)  Hematocrit 39.0 - 52.0 % 39.4  Platelets 150 - 400 K/uL 234     Dg Chest Port 1 View  Result Date: 12/26/2016 CLINICAL DATA:  Shortness of breath EXAM: PORTABLE CHEST 1 VIEW COMPARISON:  12/25/2016 FINDINGS: Cardiac enlargement. Visualized pulmonary vascularity appears normal. Moderate size right pleural effusion with atelectasis or infiltration in the right lung. Appearances are similar to prior study, lying for differences in patient positioning. Left lung is clear and expanded. Calcification of the aorta. IMPRESSION: Moderate right pleural effusion with infiltration or atelectasis in the right lung. Changes are similar to prior study, lying for differences in patient positioning. Cardiac enlargement. Electronically Signed   By: Lucienne Capers M.D.   On: 12/26/2016 03:48    Review of Systems  Constitutional: Positive for malaise/fatigue. Negative for chills and fever.  HENT: Negative.   Eyes: Negative.   Respiratory: Positive for sputum production and shortness of breath. Negative for cough.   Cardiovascular: Positive for orthopnea and leg swelling. Negative for chest pain and palpitations.  Gastrointestinal: Positive for heartburn and nausea. Negative for abdominal pain and vomiting.  Genitourinary: Positive for hematuria.  Skin: Negative.   Neurological: Positive for weakness. Negative for sensory change and headaches.   Blood pressure (!) 97/47, pulse 74, temperature 97.6 F (36.4 C), temperature source Oral, resp. rate (!) 25, height 6\' 2"  (1.88 m), weight 129.6 kg (285 lb 11.5 oz), SpO2 96 %. Physical Exam  Nursing note and vitals reviewed. Constitutional: He is  oriented to person, place, and time. He appears well-developed and well-nourished.  Lethargic but easily arousable-appears somewhat uncomfortable  HENT:  Head: Normocephalic and atraumatic.  Nose: Nose normal.  Eyes: EOM are normal. Pupils are equal, round, and reactive to light. No scleral icterus.  Neck: Normal range of motion. Neck supple. JVD present.  Cardiovascular: Normal rate, regular rhythm and normal heart sounds.   No murmur heard. Respiratory: Effort normal. He has wheezes. He has rales.  Bibasal rales  GI: Soft. Bowel sounds are normal. There is no tenderness. There is no rebound.  Musculoskeletal: He exhibits edema.  3+ lower extremity edema  Neurological: He is oriented to person, place, and time.  Somnolent  Skin: Skin is warm and dry.    Assessment/Plan: 1. Acute kidney injury: Suspected hemodynamically mediated in this patient appears to have features of CHF exacerbation with ongoing ACE inhibitor use. His recent Bactrim also likely impairing creatinine secretion and leading to elevated creatinine. Because of what appears to be at this time anuric acute kidney injury with critical hyperkalemia, will begin CRRT (he is on Neo-Synephrine for hypotension so would be unable to tolerate hemodialysis). We'll send off for some urine studies although suspect results will be heavily skewed by hematuria. Renal ultrasound without evidence of hydronephrosis. 2. Hyperkalemia: Secondary to acute kidney injury, recent lisinopril and Bactrim use. Attempting temporizing measures with intravenous sodium bicarbonate and will prescribe for Patiromer for management of hyperkalemia as we await initiation of CRRT. 3.  Respiratory failure: Suspected to be secondary to CHF exacerbation, monitor with ultrafiltration by CRRT. 4. Hematuria: Seen earlier by urology, will check for ANCA and anti-GBM  [name] K. 12/26/2016, 10:41 AM

## 2016-12-26 NOTE — Progress Notes (Signed)
MD paged about sbp 70-80s/40. Pt hr in 70s-80, pt NI and not feeling dizzy, 4L Omega sats mid 90s. No orders given at this time.

## 2016-12-26 NOTE — Progress Notes (Signed)
Order placed for arterial line to be placed.  Two RTs attempted maximum amount of attempts, all unsuccessful.  RN paged MD.  Will let MD know.  Will continue to monitor.

## 2016-12-26 NOTE — Progress Notes (Addendum)
PULMONARY / CRITICAL CARE MEDICINE   Name: Christian Garner MRN: [id number] DOB: 1946/08/01    ADMISSION DATE:  12/26/2016 CONSULTATION DATE:  12/26/16  REFERRING MD:  Tilda Burrow  CHIEF COMPLAINT:  Hypoxic respiratory failure, acute kidney failure  HISTORY OF PRESENT ILLNESS:   Christian Garner is a 71 y/o man with PMH of CHF with EF of ~30%, CAD with multiple stents, most recently 6/17 (DES), DM2 on metformin and januvia, LE edema and cellulitis currently on bactrim.  He was recently hospitalized at [facility] for CHF exacerbation and LE cellulitis, discharged on March 1st.  He was sent home on lasix, metolazone (weekly), bactrim, lisinopril, carvedilol and metformin.  He continued to have significant orthopnea after discharge and his family reports he has been unable to sleep because he has been unable to lay flat.  He went to the [facility] ED on 12/25/16 with worsening shortness of breath and was found to have an SpO2 in the low to mid 80s on room air as well as a potassium of 8.5, Cr of 3.7 increased from 1.5 at discharge and a right sided pleural effusion.  His ABG was 7.15/54 and he was started on bipap and a bicarb gtt.  He was also given albuterol, calcium, insulin and glucose and kayexalate for his hyperkalemia.  His shortness of breath improved on BiPap and he was transferred to [name] for further care.   SUBJECTIVE:  Bleeding around and through foley  VITAL SIGNS: BP (!) 108/54   Pulse 72   Temp 98.1 F (36.7 C) (Axillary)   Resp (!) 23   Ht 6\' 2"  (1.88 m)   Wt 129.6 kg (285 lb 11.5 oz)   SpO2 100%   BMI 36.68 kg/m   HEMODYNAMICS:    VENTILATOR SETTINGS: FiO2 (%):  [55 %] 55 %  INTAKE / OUTPUT: I/O last 3 completed shifts: In: 550 [IV Piggyback:550] Out: 100 [Urine:100]  PHYSICAL EXAMINATION: General:  Laying in bed, on bipap, more comfortable with BiPAP Neuro:  Alert and oriented x3 HEENT:  PERRL, EOMI, bipap mask on Cardiovascular:  RRR,  II/VI systolic murmur, no gallop, no rub Lungs:  Diminished breath sounds on right, no wheezes Abdomen:  Distended, soft, non-tender Musculoskeletal:  4+ LE pitting edema Skin:  LE erythematous from mid shin to ankles, scaly skin, healing ulcers.   LABS:  BMET No results for input(s): NA, K, CL, CO2, BUN, CREATININE, GLUCOSE in the last 168 hours.  Electrolytes No results for input(s): CALCIUM, MG, PHOS in the last 168 hours.  CBC  Recent Labs Lab 12/26/16 0716  WBC 15.5*  HGB 12.0*  HCT 39.4  PLT 234    Coag's No results for input(s): APTT, INR in the last 168 hours.  Sepsis Markers No results for input(s): LATICACIDVEN, PROCALCITON, O2SATVEN in the last 168 hours.  ABG  Recent Labs Lab 12/26/16 0355  PHART 7.265*  PCO2ART 51.2*  PO2ART 113*   Liver Enzymes No results for input(s): AST, ALT, ALKPHOS, BILITOT, ALBUMIN in the last 168 hours.  Cardiac Enzymes No results for input(s): TROPONINI, PROBNP in the last 168 hours.  Glucose  Recent Labs Lab 12/26/16 0433  GLUCAP 124*   Imaging Dg Chest Port 1 View  Result Date: 12/26/2016 CLINICAL DATA:  Shortness of breath EXAM: PORTABLE CHEST 1 VIEW COMPARISON:  12/25/2016 FINDINGS: Cardiac enlargement. Visualized pulmonary vascularity appears normal. Moderate size right pleural effusion with atelectasis or infiltration in the right lung. Appearances are similar to prior  study, lying for differences in patient positioning. Left lung is clear and expanded. Calcification of the aorta. IMPRESSION: Moderate right pleural effusion with infiltration or atelectasis in the right lung. Changes are similar to prior study, lying for differences in patient positioning. Cardiac enlargement. Electronically Signed   By: Lucienne Capers M.D.   On: 12/26/2016 03:48   STUDIES:  CXR 12/26/16 - right sided pleural effusion  CULTURES: Urine, blood (at [place])  ANTIBIOTICS: Vanc 12/26/16 --> Cefepime 12/26/16 --->  SIGNIFICANT  EVENTS:   LINES/TUBES:   DISCUSSION: 71 y/o presenting with hypoxic, hypercarbic respiratory failure, AKI, hyperkalemia and possible sepsis  ASSESSMENT / PLAN:  PULMONARY A: Respiratory failure Right sided effusion P:   BiPAP, continue for now, will attempt to wean as able Repeat ABG if mental status deteriorates High risk for intubation  CARDIOVASCULAR A:  CHF P:  Echo pending Still grossly volume overloaded but holding diuresis due to renal failure for now  RENAL A:   AKI Hyperkalemia hematuria P:   BMET now Replace electrolytes as indicated KVO IVF Hold lasix for now Urology consult called to [facility]  HEMATOLOGIC A:   Chronic anemia P:  On iron supplements - hold while NPO  INFECTIOUS A:   UA with 20-30WBC and trace LE LE cellulitis P:   Vanc/cefepime pending culture results F/u on cultures  ENDOCRINE A:   DM2   P:   SSI CBGs Hold metformin and januvia  FAMILY  - Updates: Patient updated bedside  - Inter-disciplinary family meet or Palliative Care meeting due by:  01/02/17  The patient is critically ill with multiple organ systems failure and requires high complexity decision making for assessment and support, frequent evaluation and titration of therapies, application of advanced monitoring technologies and extensive interpretation of multiple databases.   Critical Care Time devoted to patient care services described in this note is  74  Minutes. This time reflects time of care of this signee Dr Jennet Maduro. This critical care time does not reflect procedure time, or teaching time or supervisory time of PA/NP/Med student/Med Resident etc but could involve care discussion time.  Rush Farmer, M.D. [facility] Pulmonary/Critical Care Medicine. Pager: [phone]. After hours pager: [phone].  12/26/2016, 7:56 AM

## 2016-12-26 NOTE — Progress Notes (Signed)
Pharmacy Antibiotic Note  Christian Garner is a 71 y.o. male admitted on 12/26/2016 with CHF/ARF, LE cellulitis (on Bactrim PTA) and possible UTI.  Pharmacy has been consulted for Vancomycin and Cefepime dosing. SCr 3.7 at [facility] prior to transfer, 3.64 on admit at [name] (SCr 1.5 ~2wks ago, baseline ~0.7-0.8).   Per Renal, to begin CRRT with anuric AKI requiring pressors for hypotension. Received 1x doses of cefepime 2g and vancomycin 2g at ~0700 this AM. Will adjust antibiotic dosing for CRRT.  Plan: Adjust cefepime to 2g IV q12h Adjust vancomycin to 1250mg  (~10mg /kg) IV q24h Monitor clinical progress, c/s, abx plan/LOT F/u Renal plans, vancomycin levels as indicated    Height: 6\' 2"  (188 cm) Weight: 285 lb 11.5 oz (129.6 kg) IBW/kg (Calculated) : 82.2  Temp (24hrs), Avg:97.6 F (36.4 C), Min:97 F (36.1 C), Max:98.1 F (36.7 C)   Recent Labs Lab 12/26/16 0716  WBC 15.5*  CREATININE 3.64*  LATICACIDVEN 1.6    Estimated Creatinine Clearance: 26.6 mL/min (A) (by C-G formula based on SCr of 3.64 mg/dL (H)).    No Known Allergies   [name], PharmD, BCPS Clinical Pharmacist 12/26/2016 11:32 AM

## 2016-12-26 NOTE — Progress Notes (Signed)
Pt position changed and Pt woke up very agitated and reaching for ETT and trying to tongue it out. Re-orientation and assurance provided without successfully calming Pt. Pt became more agitated thrashing head side to side attempting to dislodge ETT. Fentanyl infusion bolused per order as well as Midazolam PR given before Pt calmed. Bil. Soft wrist restraints applied per protocol. E-link MD notified for Non-violent restraint orders. Will continue to monitor and assess.

## 2016-12-26 NOTE — Progress Notes (Signed)
Pharmacy Antibiotic Note  Christian Garner is a 71 y.o. male admitted on 12/26/2016 with CHF/ARF, LE cellulitis and possible UTI.  Pharmacy has been consulted for Vancomycin and Cefepime dosing.  SCr 3.7 at [place]: Cefepime 2 g IV now, then 1 g IV q24h Vancomycin 2 g IV now.   F/U renal function and redose as necessary   Height: 6\' 2"  (188 cm) Weight: 285 lb 11.5 oz (129.6 kg) IBW/kg (Calculated) : 82.2  Temp (24hrs), Avg:97.6 F (36.4 C), Min:97 F (36.1 C), Max:98.1 F (36.7 C)  No results for input(s): WBC, CREATININE, LATICACIDVEN, VANCOTROUGH, VANCOPEAK, VANCORANDOM, GENTTROUGH, GENTPEAK, GENTRANDOM, TOBRATROUGH, TOBRAPEAK, TOBRARND, AMIKACINPEAK, AMIKACINTROU, AMIKACIN in the last 168 hours.  CrCl cannot be calculated (No order found.).    No Known Allergies   Caryl Pina 12/26/2016 6:31 AM

## 2016-12-26 NOTE — Progress Notes (Signed)
[facility] Progress Note Patient Name: Christian Garner DOB: 1946-08-23 MRN: [id number]   Date of Service  12/26/2016  HPI/Events of Note  Agitation - Patient trying to grab ETT. Request for bilateral soft wrist restraints.   eICU Interventions  Will order: 1. Bilateral soft wrist restraints.     Intervention Category Minor Interventions: Agitation / anxiety - evaluation and management  Lysle Dingwall 12/26/2016, 10:32 PM

## 2016-12-26 NOTE — Procedures (Signed)
Hemodialysis Catheter Insertion Procedure Note Christian Garner [id number] Dec 08, 1945  Procedure: Insertion of Hemodialysis Catheter Indications: Dialysis Access   Procedure Details Consent: Risks of procedure as well as the alternatives and risks of each were explained to the (patient/caregiver).  Consent for procedure obtained. (Consent obtained earlier for dialysis catheter placement by [facility]. Time Out: Verified patient identification, verified procedure, site/side was marked, verified correct patient position, special equipment/implants available, medications/allergies/relevent history reviewed, required imaging and test results available.  Performed  Maximum sterile technique was used including antiseptics, cap, gloves, gown, hand hygiene, mask and sheet. Skin prep: Chlorhexidine; local anesthetic administered Triple lumen hemodialysis catheter was inserted into left internal jugular vein using the Seldinger technique.  Evaluation Blood flow good Complications: No apparent complications Patient did tolerate procedure well. Chest X-ray ordered to verify placement.  CXR: pending.   Jodi Kappes K. 12/26/2016

## 2016-12-27 ENCOUNTER — Inpatient Hospital Stay (HOSPITAL_COMMUNITY): Payer: Medicare Other

## 2016-12-27 DIAGNOSIS — J810 Acute pulmonary edema: Secondary | ICD-10-CM

## 2016-12-27 LAB — GLUCOSE, CAPILLARY
GLUCOSE-CAPILLARY: 107 mg/dL — AB (ref 65–99)
Glucose-Capillary: 120 mg/dL — ABNORMAL HIGH (ref 65–99)
Glucose-Capillary: 100 mg/dL — ABNORMAL HIGH (ref 65–99)
Glucose-Capillary: 128 mg/dL — ABNORMAL HIGH (ref 65–99)
Glucose-Capillary: 113 mg/dL — ABNORMAL HIGH (ref 65–99)

## 2016-12-27 LAB — RENAL FUNCTION PANEL
ALBUMIN: 3.2 g/dL — AB (ref 3.5–5.0)
ANION GAP: 8 (ref 5–15)
ANION GAP: 9 (ref 5–15)
Albumin: 3 g/dL — ABNORMAL LOW (ref 3.5–5.0)
BUN: 66 mg/dL — ABNORMAL HIGH (ref 6–20)
BUN: 46 mg/dL — ABNORMAL HIGH (ref 6–20)
CALCIUM: 8.3 mg/dL — AB (ref 8.9–10.3)
CALCIUM: 8.1 mg/dL — AB (ref 8.9–10.3)
CHLORIDE: 100 mmol/L — AB (ref 101–111)
CO2: 29 mmol/L (ref 22–32)
CO2: 26 mmol/L (ref 22–32)
Chloride: 101 mmol/L (ref 101–111)
Creatinine, Ser: 3.2 mg/dL — ABNORMAL HIGH (ref 0.61–1.24)
Creatinine, Ser: 2.79 mg/dL — ABNORMAL HIGH (ref 0.61–1.24)
GFR calc Af Amer: 25 mL/min — ABNORMAL LOW (ref >60)
GFR calc non Af Amer: 21 mL/min — ABNORMAL LOW (ref >60)
GFR, EST AFRICAN AMERICAN: 21 mL/min — AB (ref >60)
GFR, EST NON AFRICAN AMERICAN: 18 mL/min — AB (ref >60)
GLUCOSE: 126 mg/dL — AB (ref 65–99)
GLUCOSE: 131 mg/dL — AB (ref 65–99)
Phosphorus: 5.1 mg/dL — ABNORMAL HIGH (ref 2.5–4.6)
Phosphorus: 4.8 mg/dL — ABNORMAL HIGH (ref 2.5–4.6)
Potassium: 5.8 mmol/L — ABNORMAL HIGH (ref 3.5–5.1)
Potassium: 4.8 mmol/L (ref 3.5–5.1)
SODIUM: 135 mmol/L (ref 135–145)
Sodium: 138 mmol/L (ref 135–145)

## 2016-12-27 LAB — POCT I-STAT, CHEM 8
BUN: 46 mg/dL — AB (ref 6–20)
Calcium, Ion: 1.14 mmol/L — ABNORMAL LOW (ref 1.15–1.40)
Chloride: 101 mmol/L (ref 101–111)
Creatinine, Ser: 2.5 mg/dL — ABNORMAL HIGH (ref 0.61–1.24)
GLUCOSE: 129 mg/dL — AB (ref 65–99)
HCT: 37 % — ABNORMAL LOW (ref 39.0–52.0)
HEMOGLOBIN: 12.6 g/dL — AB (ref 13.0–17.0)
Potassium: 4.9 mmol/L (ref 3.5–5.1)
SODIUM: 139 mmol/L (ref 135–145)
TCO2: 28 mmol/L (ref 0–100)

## 2016-12-27 LAB — CBC
HEMATOCRIT: 39 % (ref 39.0–52.0)
HEMOGLOBIN: 12 g/dL — AB (ref 13.0–17.0)
MCH: 30.5 pg (ref 26.0–34.0)
MCHC: 30.8 g/dL (ref 30.0–36.0)
MCV: 99 fL (ref 78.0–100.0)
PLATELETS: 266 10*3/uL (ref 150–400)
RBC: 3.94 MIL/uL — AB (ref 4.22–5.81)
RDW: 16.8 % — ABNORMAL HIGH (ref 11.5–15.5)
WBC: 20.2 10*3/uL — AB (ref 4.0–10.5)

## 2016-12-27 LAB — PHOSPHORUS: Phosphorus: 4.9 mg/dL — ABNORMAL HIGH (ref 2.5–4.6)

## 2016-12-27 LAB — MAGNESIUM: Magnesium: 2.6 mg/dL — ABNORMAL HIGH (ref 1.7–2.4)

## 2016-12-27 LAB — C3 COMPLEMENT: C3 Complement: 116 mg/dL (ref 82–167)

## 2016-12-27 LAB — C4 COMPLEMENT: Complement C4, Body Fluid: 22 mg/dL (ref 14–44)

## 2016-12-27 MED ORDER — PRISMASOL BGK 4/2.5 32-4-2.5 MEQ/L IV SOLN
INTRAVENOUS | Status: DC
  Administered 2016-12-27 – 2016-12-30 (×26): via INTRAVENOUS_CENTRAL
  Filled 2016-12-27 (×31): qty 5000

## 2016-12-27 MED ORDER — PRISMASOL BGK 0/2.5 32-2.5 MEQ/L IV SOLN
INTRAVENOUS | Status: DC
  Administered 2016-12-27 (×2): via INTRAVENOUS_CENTRAL
  Filled 2016-12-27 (×9): qty 5000

## 2016-12-27 MED ORDER — SODIUM CHLORIDE 0.9 % IV SOLN
1.0000 mg/h | INTRAVENOUS | Status: DC
  Administered 2016-12-27 – 2016-12-29 (×3): 1 mg/h via INTRAVENOUS
  Filled 2016-12-27 (×3): qty 10

## 2016-12-27 MED ORDER — CHLORHEXIDINE GLUCONATE CLOTH 2 % EX PADS
6.0000 | MEDICATED_PAD | Freq: Every day | CUTANEOUS | Status: DC
  Administered 2016-12-28 – 2017-01-04 (×9): 6 via TOPICAL

## 2016-12-27 MED ORDER — MIDAZOLAM HCL 2 MG/2ML IJ SOLN
2.0000 mg | INTRAMUSCULAR | Status: DC | PRN
Start: 2016-12-27 — End: 2016-12-27
  Administered 2016-12-27: 2 mg via INTRAVENOUS
  Filled 2016-12-27: qty 2

## 2016-12-27 NOTE — Progress Notes (Signed)
PULMONARY / CRITICAL CARE MEDICINE   Name: Christian Garner MRN: [id number] DOB: 1946/09/12    ADMISSION DATE:  12/26/2016 CONSULTATION DATE:  12/26/16  REFERRING MD:  Tilda Burrow  CHIEF COMPLAINT:  Hypoxic respiratory failure, acute kidney failure  HISTORY OF PRESENT ILLNESS:   Christian Garner is a 71 y/o man with PMH of CHF with EF of ~30%, CAD with multiple stents, most recently 6/17 (DES), DM2 on metformin and januvia, LE edema and cellulitis currently on bactrim.  He was recently hospitalized at [facility] for CHF exacerbation and LE cellulitis, discharged on March 1st.  He was sent home on lasix, metolazone (weekly), bactrim, lisinopril, carvedilol and metformin.  He continued to have significant orthopnea after discharge and his family reports he has been unable to sleep because he has been unable to lay flat.  He went to the [facility] ED on 12/25/16 with worsening shortness of breath and was found to have an SpO2 in the low to mid 80s on room air as well as a potassium of 8.5, Cr of 3.7 increased from 1.5 at discharge and a right sided pleural effusion.  His ABG was 7.15/54 and he was started on bipap and a bicarb gtt.  He was also given albuterol, calcium, insulin and glucose and kayexalate for his hyperkalemia.  His shortness of breath improved on BiPap and he was transferred to [facility] for further care.   SUBJECTIVE:  Decompensated overnight and was intubated and CRRT started, otherwise, no events overnight.  VITAL SIGNS: BP (!) 97/57   Pulse 72   Temp 98.4 F (36.9 C) (Axillary)   Resp (!) 22   Ht 6\' 2"  (1.88 m)   Wt 129.2 kg (284 lb 13.4 oz)   SpO2 95%   BMI 36.57 kg/m   HEMODYNAMICS:    VENTILATOR SETTINGS: Vent Mode: PRVC FiO2 (%):  [40 %-100 %] 40 % Set Rate:  [22 bmp] 22 bmp Vt Set:  [732 mL] 660 mL PEEP:  [5 cmH20] 5 cmH20 Plateau Pressure:  [26 cmH20-37 cmH20] 37 cmH20  INTAKE / OUTPUT: I/O last 3 completed shifts: In: 4195.8  [I.V.:3345.8; IV Piggyback:850] Out: 2025 [KYHCW:237; Other:2598]  PHYSICAL EXAMINATION: General:  In bed agitated on vent Neuro:  Alert and oriented x3, moving all ext to command HEENT:  PERRL, EOMI, MMM and ETT in place Cardiovascular:  RRR, II/VI systolic murmur, no gallop, no rub Lungs:  Coarse BS bilaterally Abdomen:  Distended, soft, non-tender Musculoskeletal:  4+ LE pitting edema Skin:  LE erythematous from mid shin to ankles, scaly skin, healing ulcers.   LABS:  BMET  Recent Labs Lab 12/26/16 1805 12/26/16 2316 12/27/16 0241  NA 137 133* 138  K 6.7* 5.7* 5.8*  CL 102 99* 101  CO2 26 26 29   BUN 86* 70* 66*  CREATININE 3.41* 2.90* 3.20*  GLUCOSE 156* 125* 126*    Electrolytes  Recent Labs Lab 12/26/16 0716 12/26/16 1805 12/26/16 2316 12/27/16 0241  CALCIUM 8.8* 8.5* 8.0* 8.3*  MG 3.2*  --   --  2.6*  PHOS 7.6* 5.8*  --  4.9*  5.1*    CBC  Recent Labs Lab 12/26/16 0716 12/27/16 0241  WBC 15.5* 20.2*  HGB 12.0* 12.0*  HCT 39.4 39.0  PLT 234 266    Coag's  Recent Labs Lab 12/26/16 0716  INR 1.24    Sepsis Markers  Recent Labs Lab 12/26/16 0716 12/26/16 1029  LATICACIDVEN 1.6 1.5  PROCALCITON 0.11  --  ABG  Recent Labs Lab 12/26/16 0355 12/26/16 1500  PHART 7.265* 7.335*  PCO2ART 51.2* 46.0  PO2ART 113* 253*   Liver Enzymes  Recent Labs Lab 12/26/16 0716 12/26/16 1805 12/27/16 0241  AST 23  --   --   ALT 19  --   --   ALKPHOS 63  --   --   BILITOT 0.4  --   --   ALBUMIN 3.5 3.0* 3.2*    Cardiac Enzymes  Recent Labs Lab 12/26/16 0716 12/26/16 1632 12/26/16 2230  TROPONINI 0.05* 0.06* 0.07*    Glucose  Recent Labs Lab 12/26/16 0846 12/26/16 1638 12/26/16 1934 12/26/16 2335 12/27/16 0412 12/27/16 0842  GLUCAP 113* 122* 112* 102* 120* 100*   Imaging Dg Chest Port 1 View  Result Date: 12/27/2016 CLINICAL DATA:  Respiratory failure.  Intubated. EXAM: PORTABLE CHEST 1 VIEW COMPARISON:   Yesterday. FINDINGS: Endotracheal tube in satisfactory position. Left jugular catheter tip in the superior vena cava. Stable enlarged cardiac silhouette. Increased right pleural fluid. The interstitial markings remain mildly prominent. Aortic calcification. Thoracic spine degenerative changes IMPRESSION: 1. Increased right pleural fluid. 2. Stable cardiomegaly and mild interstitial lung disease. 3. Aortic atherosclerosis. Electronically Signed   By: Claudie Revering M.D.   On: 12/27/2016 07:57   Dg Chest Port 1 View  Result Date: 12/26/2016 CLINICAL DATA:  Central line placement.  Coronary disease EXAM: PORTABLE CHEST 1 VIEW COMPARISON:  12/26/2016 FINDINGS: Endotracheal tube 8 cm from carina. Introduction of a LEFT central venous line with tip in distal SVC. No pneumothorax. Bilateral moderate effusions noted. IMPRESSION: 1. Central line Placed without pneumothorax.  Tip in the distal SVC. 2. Stable bilateral pleural effusions. Electronically Signed   By: Suzy Bouchard M.D.   On: 12/26/2016 17:52   Dg Chest Port 1 View  Result Date: 12/26/2016 CLINICAL DATA:  Intubated. EXAM: PORTABLE CHEST 1 VIEW COMPARISON:  Earlier today. FINDINGS: Endotracheal tube in satisfactory position. Stable enlarged cardiac silhouette. Increased prominence of the pulmonary vasculature and interstitial markings. Mildly increased right pleural fluid. No acute bony abnormality. IMPRESSION: Satisfactory position of the endotracheal tube. Mildly progressive changes of congestive heart failure. Electronically Signed   By: Claudie Revering M.D.   On: 12/26/2016 14:54   Dg Abd Portable 1v  Result Date: 12/27/2016 CLINICAL DATA:  71 year old male status post nasogastric tube placement EXAM: PORTABLE ABDOMEN - 1 VIEW COMPARISON:  chest x-ray obtained earlier today FINDINGS: A nasogastric tube is present. The tip projects over the region of the gastric antrum. The bowel gas pattern is unremarkable. Incompletely imaged airspace disease in the  right lung with probable large effusion. No acute osseous abnormality. Mild multilevel degenerative changes throughout the visualized spine. IMPRESSION: 1. The tip of the nasogastric tube projects over the gastric antrum. 2. Incompletely imaged right pleural effusion and associated airspace opacity. Electronically Signed   By: Jacqulynn Cadet M.D.   On: 12/27/2016 09:41   STUDIES:  CXR 12/26/16 - right sided pleural effusion  CULTURES: Urine, blood (at Center)  ANTIBIOTICS: Vanc 12/26/16 --> Cefepime 12/26/16 --->  SIGNIFICANT EVENTS:   LINES/TUBES: ETT 3/17>>> L IJ trialysis catheter 3/17>>> OGT 3/17>>>  DISCUSSION: 71 y/o presenting with hypoxic, hypercarbic respiratory failure, AKI, hyperkalemia and possible sepsis  ASSESSMENT / PLAN:  PULMONARY A: Respiratory failure Right sided effusion P:   Full vent support while metabolic issues are being addressed ABG and CXR in AM Titrate o2 for sat of 88-92%  CARDIOVASCULAR A:  CHF Circulatory shock P:  Tele  monitoring Echo pending Fluid removal via CRRT Neo for BP support  RENAL A:   AKI Hyperkalemia hematuria P:   BMET now Replace electrolytes as indicated KVO IVF CRRT for hyperkalemia and fluid removal Urology consult called to [facility], appreciate input  HEMATOLOGIC A:   Chronic anemia P:  On iron supplements - hold while NPO  INFECTIOUS A:   UA with 20-30WBC and trace LE LE cellulitis P:   Vanc/cefepime pending culture results F/u on cultures  ENDOCRINE A:   DM2   P:   SSI CBGs Hold metformin and januvia  FAMILY  - Updates: No family bedside.  - Inter-disciplinary family meet or Palliative Care meeting due by:  01/02/17  The patient is critically ill with multiple organ systems failure and requires high complexity decision making for assessment and support, frequent evaluation and titration of therapies, application of advanced monitoring technologies and extensive  interpretation of multiple databases.   Critical Care Time devoted to patient care services described in this note is  78  Minutes. This time reflects time of care of this signee Dr Jennet Maduro. This critical care time does not reflect procedure time, or teaching time or supervisory time of PA/NP/Med student/Med Resident etc but could involve care discussion time.  Rush Farmer, M.D. [facility] Pulmonary/Critical Care Medicine. Pager: [phone]. After hours pager: [phone].  12/27/2016, 10:50 AM

## 2016-12-27 NOTE — Progress Notes (Signed)
Family at bedside.  Family states pt twitches "normally" at home when her is nervous or anxious.  Will continue to monitor closely.

## 2016-12-27 NOTE — Plan of Care (Signed)
Problem: Skin Integrity: Goal: Risk for impaired skin integrity will decrease Outcome: Progressing Turning Pt Q2 hrs with intact skin integrity, sacral dressing in place and HOB > 30 degrees.  Problem: Fluid Volume: Goal: Ability to maintain a balanced intake and output will improve Outcome: Not Progressing Pt required starting of CRRT per nephrology for A.K.F. and fluid overload and electrolyte imbalance. After Trialysate cath placed, CRRT started with a goal of UF -100-200 while maintaining adequate hemodynamic. Neosyn. Drip slowly wean to maintain SBP > 90 and MAP > 65.

## 2016-12-27 NOTE — Progress Notes (Signed)
Patient ID: Christian Garner, male   DOB: January 16, 1946, 71 y.o.   MRN: [id number] [facility] Progress Note   Assessment/ Plan:   1. Acute kidney injury: Suspected to be hemodynamically mediated from CHF exacerbation in the face of ongoing ACE inhibitor/diuretic therapy and likely compounded by Bactrim. Initiated on CRRT yesterday because of hemodynamic instability (was on Neo-Synephrine) for the goal to treat hyperkalemia and to engage in ultrafiltration. Able to tolerate this overnight-continue 0K bath with recheck of labs again this afternoon. 2. Hyperkalemia:  poorly responsive to measures with medical management-started on CRRT for extracorporeal removal. Still with hyperkalemia, continue 0K bath at this time. 3.  Respiratory failure: Suspected to be secondary to CHF exacerbation, monitor with ultrafiltration by CRRT. 4. Hematuria: Seen earlier by urology, will check for ANCA and anti-GBM  Subjective:   Episodes of SVT noted overnight and able to tolerate ultrafiltration without problems.    Objective:   BP 103/60   Pulse 73   Temp 98.6 F (37 C) (Oral)   Resp (!) 22   Ht 6' 2"  (1.88 m)   Wt 129.2 kg (284 lb 13.4 oz)   SpO2 96%   BMI 36.57 kg/m   Intake/Output Summary (Last 24 hours) at 12/27/16 0732 Last data filed at 12/27/16 0700  Gross per 24 hour  Intake           3645.8 ml  Output             3163 ml  Net            482.8 ml   Weight change: -0.4 kg (-14.1 oz)  Physical Exam: NID:[id number] on ventilator, right neck hematoma CVS: Pulse regular rhythm, normal rate, normal S1 and S2 Resp: Coarse breath sounds bilaterally-no rales Abd: Soft, obese, nontender Ext: Through-3+ lower extremity edema  Imaging: US Renal  Result Date: 12/26/2016 CLINICAL DATA:  71 year old male with acute kidney injury and gross hematuria EXAM: RENAL / URINARY TRACT ULTRASOUND COMPLETE COMPARISON:  None. FINDINGS: Right Kidney: Length: 14.2 cm. Echogenicity within normal  limits. No mass or hydronephrosis visualized. Left Kidney: Length: 13.5 cm. Echogenicity within normal limits. No mass or hydronephrosis visualized. Bladder: The bladder is decompressed.  The patient has a Foley catheter. IMPRESSION: 1. No evidence of hydronephrosis or other acute renal abnormality. 2. The bladder is decompressed. Electronically Signed   By: Jacqulynn Cadet M.D.   On: 12/26/2016 10:55   Dg Chest Port 1 View  Result Date: 12/26/2016 CLINICAL DATA:  Central line placement.  Coronary disease EXAM: PORTABLE CHEST 1 VIEW COMPARISON:  12/26/2016 FINDINGS: Endotracheal tube 8 cm from carina. Introduction of a LEFT central venous line with tip in distal SVC. No pneumothorax. Bilateral moderate effusions noted. IMPRESSION: 1. Central line Placed without pneumothorax.  Tip in the distal SVC. 2. Stable bilateral pleural effusions. Electronically Signed   By: Suzy Bouchard M.D.   On: 12/26/2016 17:52   Dg Chest Port 1 View  Result Date: 12/26/2016 CLINICAL DATA:  Intubated. EXAM: PORTABLE CHEST 1 VIEW COMPARISON:  Earlier today. FINDINGS: Endotracheal tube in satisfactory position. Stable enlarged cardiac silhouette. Increased prominence of the pulmonary vasculature and interstitial markings. Mildly increased right pleural fluid. No acute bony abnormality. IMPRESSION: Satisfactory position of the endotracheal tube. Mildly progressive changes of congestive heart failure. Electronically Signed   By: Claudie Revering M.D.   On: 12/26/2016 14:54   Dg Chest Port 1 View  Result Date: 12/26/2016 CLINICAL DATA:  Shortness of breath EXAM: PORTABLE  CHEST 1 VIEW COMPARISON:  12/25/2016 FINDINGS: Cardiac enlargement. Visualized pulmonary vascularity appears normal. Moderate size right pleural effusion with atelectasis or infiltration in the right lung. Appearances are similar to prior study, lying for differences in patient positioning. Left lung is clear and expanded. Calcification of the aorta. IMPRESSION:  Moderate right pleural effusion with infiltration or atelectasis in the right lung. Changes are similar to prior study, lying for differences in patient positioning. Cardiac enlargement. Electronically Signed   By: Lucienne Capers M.D.   On: 12/26/2016 03:48    Labs: BMET  Recent Labs Lab 12/26/16 0716 12/26/16 1805 12/26/16 2316 12/27/16 0241  NA 137 137 133* 138  K >7.5* 6.7* 5.7* 5.8*  CL 103 102 99* 101  CO2 25 26 26 29   GLUCOSE 115* 156* 125* 126*  BUN 89* 86* 70* 66*  CREATININE 3.64* 3.41* 2.90* 3.20*  CALCIUM 8.8* 8.5* 8.0* 8.3*  PHOS 7.6* 5.8*  --  4.9*  5.1*   CBC  Recent Labs Lab 12/26/16 0716 12/27/16 0241  WBC 15.5* 20.2*  NEUTROABS 12.5*  --   HGB 12.0* 12.0*  HCT 39.4 39.0  MCV 98.7 99.0  PLT 234 266   Medications:    . ceFEPime (MAXIPIME) IV  2 g Intravenous Q12H  . chlorhexidine gluconate (MEDLINE KIT)  15 mL Mouth Rinse BID  . Chlorhexidine Gluconate Cloth  6 each Topical Daily  . clopidogrel  75 mg Oral Daily  . fentaNYL (SUBLIMAZE) injection  400 mcg Intravenous Once  . heparin subcutaneous  5,000 Units Subcutaneous Q8H  . heparin lock flush  1,000 Units Intravenous Once  . insulin aspart  2-6 Units Subcutaneous Q4H  . mouth rinse  15 mL Mouth Rinse QID  . pantoprazole (PROTONIX) IV  40 mg Intravenous Daily  . patiromer  25.2 g Oral Daily  . sodium chloride flush  10 mL Intravenous Q12H  . sodium chloride flush  3 mL Intravenous Q12H  . vancomycin  1,250 mg Intravenous Q24H  . vecuronium  10 mg Intravenous Once   Elmarie Shiley, MD 12/27/2016, 7:32 AM

## 2016-12-27 NOTE — Progress Notes (Signed)
Dr. Posey Pronto notified of renal panel results.  Also made aware of pt twitching at times.  Pt able to give me a thumbs up and moderate hand grasp toe wiggle after twitching.  Orders received.  Will continue to closely monitor.

## 2016-12-28 ENCOUNTER — Inpatient Hospital Stay (HOSPITAL_COMMUNITY): Payer: Medicare Other

## 2016-12-28 ENCOUNTER — Other Ambulatory Visit (HOSPITAL_COMMUNITY): Payer: Medicare Other

## 2016-12-28 DIAGNOSIS — Z978 Presence of other specified devices: Secondary | ICD-10-CM

## 2016-12-28 DIAGNOSIS — N179 Acute kidney failure, unspecified: Secondary | ICD-10-CM

## 2016-12-28 DIAGNOSIS — N189 Chronic kidney disease, unspecified: Secondary | ICD-10-CM

## 2016-12-28 LAB — BASIC METABOLIC PANEL
ANION GAP: 9 (ref 5–15)
BUN: 35 mg/dL — ABNORMAL HIGH (ref 6–20)
CALCIUM: 8.3 mg/dL — AB (ref 8.9–10.3)
CO2: 26 mmol/L (ref 22–32)
CREATININE: 2.64 mg/dL — AB (ref 0.61–1.24)
Chloride: 102 mmol/L (ref 101–111)
GFR, EST AFRICAN AMERICAN: 26 mL/min — AB (ref >60)
GFR, EST NON AFRICAN AMERICAN: 23 mL/min — AB (ref >60)
Glucose, Bld: 105 mg/dL — ABNORMAL HIGH (ref 65–99)
Potassium: 4.8 mmol/L (ref 3.5–5.1)
SODIUM: 137 mmol/L (ref 135–145)

## 2016-12-28 LAB — BLOOD GAS, ARTERIAL
Acid-base deficit: 0.1 mmol/L (ref 0.0–2.0)
BICARBONATE: 25.4 mmol/L (ref 20.0–28.0)
Drawn by: 362771
FIO2: 40
LHR: 22 {breaths}/min
O2 Saturation: 90.5 %
PATIENT TEMPERATURE: 99
PCO2 ART: 51.9 mmHg — AB (ref 32.0–48.0)
PEEP: 5 cmH2O
VT: 660 mL
pH, Arterial: 7.311 — ABNORMAL LOW (ref 7.350–7.450)
pO2, Arterial: 66 mmHg — ABNORMAL LOW (ref 83.0–108.0)

## 2016-12-28 LAB — POCT ACTIVATED CLOTTING TIME
ACTIVATED CLOTTING TIME: 164 s
ACTIVATED CLOTTING TIME: 164 s
ACTIVATED CLOTTING TIME: 175 s
Activated Clotting Time: 131 seconds
Activated Clotting Time: 136 seconds
Activated Clotting Time: 142 seconds
Activated Clotting Time: 147 seconds
Activated Clotting Time: 164 seconds
Activated Clotting Time: 153 seconds
Activated Clotting Time: 158 seconds
Activated Clotting Time: 158 seconds
Activated Clotting Time: 158 seconds
Activated Clotting Time: 158 seconds
Activated Clotting Time: 164 seconds

## 2016-12-28 LAB — GLUCOSE, CAPILLARY
GLUCOSE-CAPILLARY: 102 mg/dL — AB (ref 65–99)
GLUCOSE-CAPILLARY: 105 mg/dL — AB (ref 65–99)
GLUCOSE-CAPILLARY: 112 mg/dL — AB (ref 65–99)
GLUCOSE-CAPILLARY: 128 mg/dL — AB (ref 65–99)
GLUCOSE-CAPILLARY: 85 mg/dL (ref 65–99)
Glucose-Capillary: 89 mg/dL (ref 65–99)

## 2016-12-28 LAB — RENAL FUNCTION PANEL
ALBUMIN: 2.9 g/dL — AB (ref 3.5–5.0)
ANION GAP: 11 (ref 5–15)
BUN: 30 mg/dL — AB (ref 6–20)
CALCIUM: 8.2 mg/dL — AB (ref 8.9–10.3)
CO2: 27 mmol/L (ref 22–32)
Chloride: 100 mmol/L — ABNORMAL LOW (ref 101–111)
Creatinine, Ser: 2.04 mg/dL — ABNORMAL HIGH (ref 0.61–1.24)
GFR calc Af Amer: 36 mL/min — ABNORMAL LOW (ref >60)
GFR, EST NON AFRICAN AMERICAN: 31 mL/min — AB (ref >60)
GLUCOSE: 129 mg/dL — AB (ref 65–99)
PHOSPHORUS: 3.8 mg/dL (ref 2.5–4.6)
POTASSIUM: 4.8 mmol/L (ref 3.5–5.1)
SODIUM: 138 mmol/L (ref 135–145)

## 2016-12-28 LAB — MPO/PR-3 (ANCA) ANTIBODIES
ANCA Proteinase 3: <3.5 U/mL (ref 0.0–3.5)
Myeloperoxidase Abs: <9 U/mL (ref 0.0–9.0)

## 2016-12-28 LAB — CBC
HEMATOCRIT: 38.4 % — AB (ref 39.0–52.0)
Hemoglobin: 11.6 g/dL — ABNORMAL LOW (ref 13.0–17.0)
MCH: 29.9 pg (ref 26.0–34.0)
MCHC: 30.2 g/dL (ref 30.0–36.0)
MCV: 99 fL (ref 78.0–100.0)
PLATELETS: 213 10*3/uL (ref 150–400)
RBC: 3.88 MIL/uL — ABNORMAL LOW (ref 4.22–5.81)
RDW: 17 % — AB (ref 11.5–15.5)
WBC: 19.5 10*3/uL — ABNORMAL HIGH (ref 4.0–10.5)

## 2016-12-28 LAB — MAGNESIUM
MAGNESIUM: 2.6 mg/dL — AB (ref 1.7–2.4)
MAGNESIUM: 2.7 mg/dL — AB (ref 1.7–2.4)
Magnesium: 2.5 mg/dL — ABNORMAL HIGH (ref 1.7–2.4)

## 2016-12-28 LAB — PHOSPHORUS
PHOSPHORUS: 4.2 mg/dL (ref 2.5–4.6)
PHOSPHORUS: 3.9 mg/dL (ref 2.5–4.6)
PHOSPHORUS: 3.7 mg/dL (ref 2.5–4.6)

## 2016-12-28 LAB — UREA NITROGEN, URINE: UREA NITROGEN UR: 270 mg/dL

## 2016-12-28 LAB — STREP PNEUMONIAE URINARY ANTIGEN: STREP PNEUMO URINARY ANTIGEN: NEGATIVE

## 2016-12-28 LAB — GLOMERULAR BASEMENT MEMBRANE ANTIBODIES: GBM AB: 4 U (ref 0–20)

## 2016-12-28 LAB — ALBUMIN: Albumin: 3 g/dL — ABNORMAL LOW (ref 3.5–5.0)

## 2016-12-28 MED ORDER — SODIUM CHLORIDE 0.9 % IJ SOLN
250.0000 [IU]/h | INTRAMUSCULAR | Status: DC
  Administered 2016-12-28: 1150 [IU]/h via INTRAVENOUS_CENTRAL
  Administered 2016-12-28: 250 [IU]/h via INTRAVENOUS_CENTRAL
  Administered 2016-12-28: 1750 [IU]/h via INTRAVENOUS_CENTRAL
  Administered 2016-12-29 (×2): 1950 [IU]/h via INTRAVENOUS_CENTRAL
  Administered 2016-12-29 – 2016-12-30 (×5): 2000 [IU]/h via INTRAVENOUS_CENTRAL
  Filled 2016-12-28 (×11): qty 2

## 2016-12-28 MED ORDER — VITAL HIGH PROTEIN PO LIQD
1000.0000 mL | ORAL | Status: DC
  Administered 2016-12-28 (×2)
  Administered 2016-12-28: 1000 mL

## 2016-12-28 MED ORDER — VITAL HIGH PROTEIN PO LIQD
1000.0000 mL | ORAL | Status: DC
  Administered 2016-12-28: 1000 mL
  Administered 2016-12-29 (×9)
  Administered 2016-12-29: 1000 mL
  Administered 2016-12-29 (×2)
  Administered 2016-12-29 – 2016-12-30 (×2): 1000 mL
  Administered 2016-12-30 (×4)
  Administered 2016-12-30: 1000 mL
  Administered 2016-12-30: 14:00:00
  Administered 2016-12-30: 1000 mL
  Filled 2016-12-28: qty 1000

## 2016-12-28 MED ORDER — PRO-STAT SUGAR FREE PO LIQD
30.0000 mL | Freq: Two times a day (BID) | ORAL | Status: DC
  Administered 2016-12-28: 30 mL
  Filled 2016-12-28: qty 30

## 2016-12-28 MED ORDER — PRISMASOL BGK 4/2.5 32-4-2.5 MEQ/L IV SOLN
INTRAVENOUS | Status: DC
  Administered 2016-12-28 – 2016-12-30 (×4): via INTRAVENOUS_CENTRAL
  Filled 2016-12-28 (×7): qty 5000

## 2016-12-28 MED ORDER — VITAL HIGH PROTEIN PO LIQD: 1000.0000 mL | ORAL | Status: DC

## 2016-12-28 MED ORDER — PRO-STAT SUGAR FREE PO LIQD
60.0000 mL | Freq: Two times a day (BID) | ORAL | Status: DC
  Administered 2016-12-28 – 2016-12-31 (×6): 60 mL
  Filled 2016-12-28 (×6): qty 60

## 2016-12-28 MED ORDER — HEPARIN BOLUS VIA INFUSION (CRRT)
1000.0000 [IU] | INTRAVENOUS | Status: DC | PRN
  Administered 2016-12-28 (×4): 1000 [IU] via INTRAVENOUS_CENTRAL
  Filled 2016-12-28: qty 1000

## 2016-12-28 NOTE — Progress Notes (Addendum)
Initial Nutrition Assessment  DOCUMENTATION CODES:   Obesity unspecified  INTERVENTION:    Vital High Protein at goal rate of 55 ml/h (1320 ml per day) and Prostat 60 ml BID   TF regimen to provide 1720 kcals, 175 gm protein, 1103 ml of free water  NUTRITION DIAGNOSIS:   Inadequate oral intake related to inability to eat as evidenced by NPO status  GOAL:   Provide needs based on ASPEN/SCCM guidelines  MONITOR:   Vent status, TF tolerance, Labs, Weight trends, Skin, I & O's  REASON FOR ASSESSMENT:   Consult Enteral/tube feeding initiation and management  ASSESSMENT:   71 yo Male with PMH significant for CHF with EF of ~30%, CAD, DM2, LE edema and cellulitis; went to the [facility] ED on 12/25/16 with worsening shortness of breath and was found to have an SpO2 in the low to mid 80s on room air as well as a potassium of 8.5, Cr of 3.7 increased from 1.5 at discharge and a right sided pleural effusion; transferred to [facility] for further care.    Patient is currently intubated on ventilator support Temp (24hrs), Avg:98.6 F (37 C), Min:97.9 F (36.6 C), Max:99 F (37.2 C)  Pt transferred from [facility] for hypoxic respiratory failure and acute renal failure. Vital High Protein formula initiated via Adult Tube Feeding Protocol this AM.  Currently infusing at 40 ml/hr via OGT. Medications reviewed and include Fentanyl and Versed. Labs reviewed.  Magnesium 2.6 (H). CBG's [id number]. On CRRT.  Diet Order:  Diet NPO time specified  Skin:  Reviewed, no issues  Last BM:  3/16  Height:   Ht Readings from Last 1 Encounters:  12/26/16 6\' 2"  (1.88 m)    Weight:   Wt Readings from Last 1 Encounters:  12/28/16 274 lb 11.1 oz (124.6 kg)    Ideal Body Weight:  86.3 kg  BMI:  Body mass index is 35.27 kg/m.  Estimated Nutritional Needs:   Kcal:  1375-1750  Protein:  >/= 172 gm  Fluid:  per MD  EDUCATION NEEDS:   No education needs identified at  this time  Arthur Holms, RD, LDN Pager #: [phone] After-Hours Pager #: [phone]

## 2016-12-28 NOTE — Progress Notes (Signed)
PULMONARY / CRITICAL CARE MEDICINE   Name: Christian Garner MRN: [id number] DOB: 30-Nov-1945    ADMISSION DATE:  12/26/2016 CONSULTATION DATE:  12/26/16  REFERRING MD:  Tilda Burrow  CHIEF COMPLAINT:  Hypoxic respiratory failure, acute kidney failure  HISTORY OF PRESENT ILLNESS:   Mr. Christian Garner is a 71 y/o man with PMH of CHF with EF of ~30%, CAD with multiple stents, most recently 6/17 (DES), DM2 on metformin and januvia, LE edema and cellulitis currently on bactrim.  He was recently hospitalized at [facility] for CHF exacerbation and LE cellulitis, discharged on March 1st.  He was sent home on lasix, metolazone (weekly), bactrim, lisinopril, carvedilol and metformin.  He continued to have significant orthopnea after discharge and his family reports he has been unable to sleep because he has been unable to lay flat.  He went to the [facility] ED on 12/25/16 with worsening shortness of breath and was found to have an SpO2 in the low to mid 80s on room air as well as a potassium of 8.5, Cr of 3.7 increased from 1.5 at discharge and a right sided pleural effusion.  His ABG was 7.15/54 and he was started on bipap and a bicarb gtt.  He was also given albuterol, calcium, insulin and glucose and kayexalate for his hyperkalemia.  His shortness of breath improved on BiPap and he was transferred to [facility] for further care.   SUBJECTIVE:  Tolerating CRRT net negative 3300 last 24 with continued edema per CXR, goal is 150-200 cc off per hour , weaning Neo, CPAP/PS this am.  VITAL SIGNS: BP (!) 126/50   Pulse 85   Temp 99 F (37.2 C) (Oral)   Resp (!) 23   Ht 6\' 2"  (1.88 m)   Wt 274 lb 11.1 oz (124.6 kg)   SpO2 96%   BMI 35.27 kg/m   HEMODYNAMICS:    VENTILATOR SETTINGS: Vent Mode: PSV;CPAP FiO2 (%):  [40 %] 40 % Set Rate:  [22 bmp] 22 bmp Vt Set:  [626 mL] 660 mL PEEP:  [5 cmH20] 5 cmH20 Pressure Support:  [5 cmH20] 5 cmH20 Plateau Pressure:  [27 cmH20-29 cmH20] 27  cmH20  INTAKE / OUTPUT: I/O last 3 completed shifts: In: 3495.4 [I.V.:2805.4; NG/GT:90; IV RSWNIOEVO:350] Out: 0938 [Urine:185; Emesis/NG output:100; HWEXH:3716]  PHYSICAL EXAMINATION: General:  Calm, sedated, supine in bed, CVVH ongoing Neuro:  Alert and oriented x3, moving all ext to command HEENT:  PERRL, EOMI, MMM and ETT in place Cardiovascular:  RRR, II/VI systolic murmur, no gallop, no rub Lungs:  Coarse BS bilaterally Abdomen:  Distended, soft, non-tender Musculoskeletal:  4+ LE pitting edema Skin:  LE erythematous from mid shin to ankles, scaly skin, healing ulcers.   LABS:  BMET  Recent Labs Lab 12/27/16 0241 12/27/16 1346 12/27/16 1351 12/28/16 0331  NA 138 135 139 137  K 5.8* 4.8 4.9 4.8  CL 101 100* 101 102  CO2 29 26  --  26  BUN 66* 46* 46* 35*  CREATININE 3.20* 2.79* 2.50* 2.64*  GLUCOSE 126* 131* 129* 105*    Electrolytes  Recent Labs Lab 12/26/16 0716  12/27/16 0241 12/27/16 1346 12/28/16 0331  CALCIUM 8.8*  < > 8.3* 8.1* 8.3*  MG 3.2*  --  2.6*  --  2.6*  PHOS 7.6*  < > 4.9*  5.1* 4.8* 4.2  < > = values in this interval not displayed.  CBC  Recent Labs Lab 12/26/16 0716 12/27/16 0241 12/27/16 1351 12/28/16 0331  WBC 15.5* 20.2*  --  19.5*  HGB 12.0* 12.0* 12.6* 11.6*  HCT 39.4 39.0 37.0* 38.4*  PLT 234 266  --  213    Coag's  Recent Labs Lab 12/26/16 0716  INR 1.24    Sepsis Markers  Recent Labs Lab 12/26/16 0716 12/26/16 1029  LATICACIDVEN 1.6 1.5  PROCALCITON 0.11  --     ABG  Recent Labs Lab 12/26/16 0355 12/26/16 1500 12/28/16 0455  PHART 7.265* 7.335* 7.311*  PCO2ART 51.2* 46.0 51.9*  PO2ART 113* 253* 66.0*   Liver Enzymes  Recent Labs Lab 12/26/16 0716  12/27/16 0241 12/27/16 1346 12/28/16 0331  AST 23  --   --   --   --   ALT 19  --   --   --   --   ALKPHOS 63  --   --   --   --   BILITOT 0.4  --   --   --   --   ALBUMIN 3.5  < > 3.2* 3.0* 3.0*  < > = values in this interval not  displayed.  Cardiac Enzymes  Recent Labs Lab 12/26/16 0716 12/26/16 1632 12/26/16 2230  TROPONINI 0.05* 0.06* 0.07*    Glucose  Recent Labs Lab 12/27/16 1246 12/27/16 1655 12/27/16 2017 12/28/16 0015 12/28/16 0330 12/28/16 0825  GLUCAP 107* 128* 113* 105* 89 85   Imaging Dg Chest Port 1 View  Result Date: 12/28/2016 CLINICAL DATA:  Hypoxia EXAM: PORTABLE CHEST 1 VIEW COMPARISON:  December 27, 2016 FINDINGS: Endotracheal tube tip is 3.4 cm above the carina. Nasogastric tube tip and side port below the diaphragm. Central catheter tip is at the junction of the left innominate vein and superior vena cava. No pneumothorax. There is persistent pleural effusion on the right with consolidation in the right base region. There is atelectatic change in the left base. Left lung is otherwise clear. There is cardiomegaly with pulmonary venous hypertension. There is atherosclerotic calcification aorta. No evident adenopathy. No bone lesions. IMPRESSION: Findings suspicious for a degree of underlying congestive heart failure. Sizable pleural effusion on the right with consolidation in the right base. There may be superimposed pneumonia in the right base. There is mild atelectasis in the left base. There is aortic atherosclerosis. Tube and catheter positions as described without evident pneumothorax. Electronically Signed   By: Lowella Grip III M.D.   On: 12/28/2016 07:21   STUDIES:  CXR 12/26/16 - right sided pleural effusion CXR 12/28/16- underlying CHF, continued R pleural effusion,? Right infiltration  CULTURES: Urine, blood (at [place]) Urine 3/19>> Sputun 3/19>>  ANTIBIOTICS: Vanc 12/26/16 -->3/19 Cefepime 12/26/16 --->  SIGNIFICANT EVENTS: CRRT 3/17  LINES/TUBES: ETT 3/17>>> L IJ trialysis catheter 3/17>>> OGT 3/17>>>  DISCUSSION: 71 y/o presenting with hypoxic, hypercarbic respiratory failure, AKI, hyperkalemia and possible sepsis  ASSESSMENT /  PLAN:  PULMONARY A: Respiratory failure Right sided effusion P:   Full vent support while metabolic issues are being addressed ABG and CXR 3/20 Titrate PEEP and Oxygen for sat of 88-92% SBT as able Sputum Culture  CARDIOVASCULAR A:  CHF Circulatory shock P:  Continuous Tele monitoring Echo pending 3/19 Fluid removal via CRRT( Goal 150-200/ hr per renal) Neo for BP support ( Goal MAP > 65)  RENAL A:   AKI Hyperkalemia hematuria P:   BMET 3/20 am and at 1600 per renal Replete electrolytes as indicated KVO IVF CRRT for hyperkalemia and fluid removal per renal Avoid nephrotoxic medications Assure renal perfusion [facility]rology consult [facility], appreciate input  HEMATOLOGIC A:   Chronic anemia Hamaturia P:  On iron supplements - hold while NPO Transfuse for HGB < 7 ANCA and anti-GBM- pending but complements negative CBC daily   INFECTIOUS A:   UA with 20-30WBC and trace LE LE cellulitis Leukocytosis P:   Vanc/cefepime pending culture results F/u on cultures  GI A: Distended quiet abdomen OG to suction>> 100 cc bilious last 24 hours KUB 3/18>>  bowel gas pattern is unremarkable. P: Consider clamping OG tube  Dietary consult for tube feeds  ENDOCRINE A:   DM2   P:   SSI per glycemic protocol CBGs Q 4 Continue to Hold metformin and januvia  FAMILY  - Updates: No family bedside.  - Inter-disciplinary family meet or Palliative Care meeting due by:  01/02/17  Magdalen Spatz, AGACNP-BC [place] After hours pager: [phone].  12/28/2016, 9:40 AM   STAFF NOTE: Linwood Dibbles, MD FACP have personally reviewed patient's available data, including medical history, events of note, physical examination and test results as part of my evaluation. I have discussed with resident/NP and other care providers such as pharmacist, RN and RRT. In addition, I personally evaluated patient and elicited key findings  of: agitation with stimulation, reduced BS rt base, abdo soft, rass -2 now, remains on neo but was grossly negative again on cvvhd and tolerated overall, unlikley to be able to resolve this rt effusion on cvvhd alone, may need thora, will follow weaning success, wean cpap 5 ps 5 goal 2 hours, with continued neg balance needed, maintain neg balance on cvvhd and follow neo needs, follow lytes in am on cvvhd, will hold off thora for now, abg reviewed, may need slight Increase MV on rest mode, no mrsa noted, dc vanc, maintain cef abx for now, follow cellulitis clinically, Abdo film reassuring, start feeds The patient is critically ill with multiple organ systems failure and requires high complexity decision making for assessment and support, frequent evaluation and titration of therapies, application of advanced monitoring technologies and extensive interpretation of multiple databases.   Critical Care Time devoted to patient care services described in this note is 30 Minutes. This time reflects time of care of this signee: Merrie Roof, MD FACP. This critical care time does not reflect procedure time, or teaching time or supervisory time of PA/NP/Med student/Med Resident etc but could involve care discussion time. Rest per NP/medical resident whose note is outlined above and that I agree with   Lavon Paganini. Titus Mould, MD, [place] Pgr: [facility] 12/28/2016 10:24 AM

## 2016-12-28 NOTE — Progress Notes (Signed)
Daughter called to notify that she was just went to the doctor and tested positive for strep throat, would like dad (patient) to be test, called and received verbal order from Dr. Titus Mould strep pneumoniae urinary antigen and culture, group A strep.  Rowe Pavy, RN

## 2016-12-28 NOTE — Plan of Care (Signed)
Problem: Education: Goal: Knowledge of  General Education information/materials will improve Outcome: Not Progressing Pt sedated on ventilator with RASS goal of -2.  Problem: Fluid Volume: Goal: Ability to maintain a balanced intake and output will improve Outcome: Progressing Pt tolerating CRRT with UF goal of -100-200 on minimal pressor support of Neosyn. Maintaining a current fluid balance > 3L

## 2016-12-28 NOTE — Progress Notes (Signed)
RT note: Sputum sample obtained and sent down to main lab without complications.   

## 2016-12-28 NOTE — Progress Notes (Signed)
Patient ID: Christian Garner, male   DOB: 1946-06-27, 71 y.o.   MRN: [id number] Christian Garner Progress Note   Assessment/ Plan:   1. Acute kidney injury: Suspected to be hemodynamically mediated from CHF exacerbation in the face of ongoing ACE inhibitor/diuretic therapy and likely compounded by Bactrim. Initiated on CRRT 3/17 because of hemodynamic instability for the goal to treat hyperkalemia and to engage in ultrafiltration. Able to tolerate this but now question of pressors dose increasing ? overnight-continue UF for now but if pressor dose needing up through day may need to back off on UF. 0K bath for pre and post- will change pre to 4 K  with recheck of labs again this afternoon. 2. Hyperkalemia:  poorly responsive to measures with medical management-started on CRRT for extracorporeal removal. Still with hyperkalemia, slowly changing fluids over to 4 K- now will be pre and dialysate with post being 0 K  3.  Respiratory failure: Suspected to be secondary to CHF exacerbation, monitor with ultrafiltration by CRRT. Minus 3400 last 24 hours and continued edema 4. Hematuria: Seen earlier by urology, will check for ANCA and anti-GBM- pending but complements negative 5. Machine clotting- will add some systemic heparin   Subjective:    ultrafiltration continues  but pressors up some overnight- now back down.  Filter needing to be changed q shift - a little UOP this AM ?     Objective:   BP 120/72   Pulse 75   Temp 99 F (37.2 C) (Oral)   Resp (!) 22   Ht 6' 2"  (1.88 m)   Wt 124.6 kg (274 lb 11.1 oz)   SpO2 99%   BMI 35.27 kg/m   Intake/Output Summary (Last 24 hours) at 12/28/16 0815 Last data filed at 12/28/16 0800  Gross per 24 hour  Intake          1854.48 ml  Output             5148 ml  Net         -3293.52 ml   Weight change: -4.6 kg (-10 lb 2.3 oz)  Physical Exam: [id number] on ventilator, right neck hematoma- left IJ vascath placed 3/17  CVS: Pulse regular  rhythm, normal rate, normal S1 and S2 Resp: Coarse breath sounds bilaterally-no rales Abd: Soft, obese, nontender Ext: Through-3+ lower extremity edema  Imaging: US Renal  Result Date: 12/26/2016 CLINICAL DATA:  71 year old male with acute kidney injury and gross hematuria EXAM: RENAL / URINARY TRACT ULTRASOUND COMPLETE COMPARISON:  None. FINDINGS: Right Kidney: Length: 14.2 cm. Echogenicity within normal limits. No mass or hydronephrosis visualized. Left Kidney: Length: 13.5 cm. Echogenicity within normal limits. No mass or hydronephrosis visualized. Bladder: The bladder is decompressed.  The patient has a Foley catheter. IMPRESSION: 1. No evidence of hydronephrosis or other acute renal abnormality. 2. The bladder is decompressed. Electronically Signed   By: Jacqulynn Cadet M.D.   On: 12/26/2016 10:55   Dg Chest Port 1 View  Result Date: 12/28/2016 CLINICAL DATA:  Hypoxia EXAM: PORTABLE CHEST 1 VIEW COMPARISON:  December 27, 2016 FINDINGS: Endotracheal tube tip is 3.4 cm above the carina. Nasogastric tube tip and side port below the diaphragm. Central catheter tip is at the junction of the left innominate vein and superior vena cava. No pneumothorax. There is persistent pleural effusion on the right with consolidation in the right base region. There is atelectatic change in the left base. Left lung is otherwise clear. There is cardiomegaly with pulmonary venous  hypertension. There is atherosclerotic calcification aorta. No evident adenopathy. No bone lesions. IMPRESSION: Findings suspicious for a degree of underlying congestive heart failure. Sizable pleural effusion on the right with consolidation in the right base. There may be superimposed pneumonia in the right base. There is mild atelectasis in the left base. There is aortic atherosclerosis. Tube and catheter positions as described without evident pneumothorax. Electronically Signed   By: Lowella Grip III M.D.   On: 12/28/2016 07:21   Dg  Chest Port 1 View  Result Date: 12/27/2016 CLINICAL DATA:  Respiratory failure.  Intubated. EXAM: PORTABLE CHEST 1 VIEW COMPARISON:  Yesterday. FINDINGS: Endotracheal tube in satisfactory position. Left jugular catheter tip in the superior vena cava. Stable enlarged cardiac silhouette. Increased right pleural fluid. The interstitial markings remain mildly prominent. Aortic calcification. Thoracic spine degenerative changes IMPRESSION: 1. Increased right pleural fluid. 2. Stable cardiomegaly and mild interstitial lung disease. 3. Aortic atherosclerosis. Electronically Signed   By: Claudie Revering M.D.   On: 12/27/2016 07:57   Dg Chest Port 1 View  Result Date: 12/26/2016 CLINICAL DATA:  Central line placement.  Coronary disease EXAM: PORTABLE CHEST 1 VIEW COMPARISON:  12/26/2016 FINDINGS: Endotracheal tube 8 cm from carina. Introduction of a LEFT central venous line with tip in distal SVC. No pneumothorax. Bilateral moderate effusions noted. IMPRESSION: 1. Central line Placed without pneumothorax.  Tip in the distal SVC. 2. Stable bilateral pleural effusions. Electronically Signed   By: Suzy Bouchard M.D.   On: 12/26/2016 17:52   Dg Chest Port 1 View  Result Date: 12/26/2016 CLINICAL DATA:  Intubated. EXAM: PORTABLE CHEST 1 VIEW COMPARISON:  Earlier today. FINDINGS: Endotracheal tube in satisfactory position. Stable enlarged cardiac silhouette. Increased prominence of the pulmonary vasculature and interstitial markings. Mildly increased right pleural fluid. No acute bony abnormality. IMPRESSION: Satisfactory position of the endotracheal tube. Mildly progressive changes of congestive heart failure. Electronically Signed   By: Claudie Revering M.D.   On: 12/26/2016 14:54   Dg Abd Portable 1v  Result Date: 12/27/2016 CLINICAL DATA:  71 year old male status post nasogastric tube placement EXAM: PORTABLE ABDOMEN - 1 VIEW COMPARISON:  chest x-ray obtained earlier today FINDINGS: A nasogastric tube is present. The  tip projects over the region of the gastric antrum. The bowel gas pattern is unremarkable. Incompletely imaged airspace disease in the right lung with probable large effusion. No acute osseous abnormality. Mild multilevel degenerative changes throughout the visualized spine. IMPRESSION: 1. The tip of the nasogastric tube projects over the gastric antrum. 2. Incompletely imaged right pleural effusion and associated airspace opacity. Electronically Signed   By: Jacqulynn Cadet M.D.   On: 12/27/2016 09:41    Labs: BMET  Recent Labs Lab 12/26/16 0716 12/26/16 1805 12/26/16 2316 12/27/16 0241 12/27/16 1346 12/27/16 1351 12/28/16 0331  NA 137 137 133* 138 135 139 137  K >7.5* 6.7* 5.7* 5.8* 4.8 4.9 4.8  CL 103 102 99* 101 100* 101 102  CO2 25 26 26 29 26   --  26  GLUCOSE 115* 156* 125* 126* 131* 129* 105*  BUN 89* 86* 70* 66* 46* 46* 35*  CREATININE 3.64* 3.41* 2.90* 3.20* 2.79* 2.50* 2.64*  CALCIUM 8.8* 8.5* 8.0* 8.3* 8.1*  --  8.3*  PHOS 7.6* 5.8*  --  4.9*  5.1* 4.8*  --  4.2   CBC  Recent Labs Lab 12/26/16 0716 12/27/16 0241 12/27/16 1351 12/28/16 0331  WBC 15.5* 20.2*  --  19.5*  NEUTROABS 12.5*  --   --   --  HGB 12.0* 12.0* 12.6* 11.6*  HCT 39.4 39.0 37.0* 38.4*  MCV 98.7 99.0  --  99.0  PLT 234 266  --  213   Medications:    . ceFEPime (MAXIPIME) IV  2 g Intravenous Q12H  . chlorhexidine gluconate (MEDLINE KIT)  15 mL Mouth Rinse BID  . Chlorhexidine Gluconate Cloth  6 each Topical Daily  . clopidogrel  75 mg Oral Daily  . fentaNYL (SUBLIMAZE) injection  400 mcg Intravenous Once  . heparin subcutaneous  5,000 Units Subcutaneous Q8H  . heparin lock flush  1,000 Units Intravenous Once  . insulin aspart  2-6 Units Subcutaneous Q4H  . mouth rinse  15 mL Mouth Rinse QID  . pantoprazole (PROTONIX) IV  40 mg Intravenous Daily  . sodium chloride flush  10 mL Intravenous Q12H  . sodium chloride flush  3 mL Intravenous Q12H  . vancomycin  1,250 mg Intravenous Q24H   . vecuronium  10 mg Intravenous Once   Antwone Capozzoli A  12/28/2016, 8:15 AM

## 2016-12-29 ENCOUNTER — Inpatient Hospital Stay (HOSPITAL_COMMUNITY): Payer: Medicare Other

## 2016-12-29 DIAGNOSIS — I509 Heart failure, unspecified: Secondary | ICD-10-CM

## 2016-12-29 LAB — GLUCOSE, CAPILLARY
GLUCOSE-CAPILLARY: 136 mg/dL — AB (ref 65–99)
GLUCOSE-CAPILLARY: 113 mg/dL — AB (ref 65–99)
Glucose-Capillary: 121 mg/dL — ABNORMAL HIGH (ref 65–99)
Glucose-Capillary: 126 mg/dL — ABNORMAL HIGH (ref 65–99)
Glucose-Capillary: 132 mg/dL — ABNORMAL HIGH (ref 65–99)
Glucose-Capillary: 138 mg/dL — ABNORMAL HIGH (ref 65–99)
Glucose-Capillary: 150 mg/dL — ABNORMAL HIGH (ref 65–99)

## 2016-12-29 LAB — BASIC METABOLIC PANEL
ANION GAP: 9 (ref 5–15)
BUN: 29 mg/dL — ABNORMAL HIGH (ref 6–20)
CHLORIDE: 101 mmol/L (ref 101–111)
CO2: 28 mmol/L (ref 22–32)
Calcium: 8.2 mg/dL — ABNORMAL LOW (ref 8.9–10.3)
Creatinine, Ser: 1.63 mg/dL — ABNORMAL HIGH (ref 0.61–1.24)
GFR calc Af Amer: 47 mL/min — ABNORMAL LOW (ref >60)
GFR, EST NON AFRICAN AMERICAN: 41 mL/min — AB (ref >60)
GLUCOSE: 141 mg/dL — AB (ref 65–99)
POTASSIUM: 4.4 mmol/L (ref 3.5–5.1)
Sodium: 138 mmol/L (ref 135–145)

## 2016-12-29 LAB — RENAL FUNCTION PANEL
ALBUMIN: 2.7 g/dL — AB (ref 3.5–5.0)
Anion gap: 9 (ref 5–15)
BUN: 26 mg/dL — AB (ref 6–20)
CO2: 28 mmol/L (ref 22–32)
CREATININE: 1.27 mg/dL — AB (ref 0.61–1.24)
Calcium: 8.3 mg/dL — ABNORMAL LOW (ref 8.9–10.3)
Chloride: 102 mmol/L (ref 101–111)
GFR calc Af Amer: >60 mL/min (ref >60)
GFR, EST NON AFRICAN AMERICAN: 55 mL/min — AB (ref >60)
Glucose, Bld: 137 mg/dL — ABNORMAL HIGH (ref 65–99)
PHOSPHORUS: 2.5 mg/dL (ref 2.5–4.6)
Potassium: 4.5 mmol/L (ref 3.5–5.1)
Sodium: 139 mmol/L (ref 135–145)

## 2016-12-29 LAB — MAGNESIUM
MAGNESIUM: 2.7 mg/dL — AB (ref 1.7–2.4)
Magnesium: 2.7 mg/dL — ABNORMAL HIGH (ref 1.7–2.4)

## 2016-12-29 LAB — URINE CULTURE
CULTURE: NO GROWTH
Special Requests: NORMAL

## 2016-12-29 LAB — POCT ACTIVATED CLOTTING TIME
ACTIVATED CLOTTING TIME: 191 s
ACTIVATED CLOTTING TIME: 197 s
ACTIVATED CLOTTING TIME: 197 s
ACTIVATED CLOTTING TIME: 180 s
ACTIVATED CLOTTING TIME: 197 s
ACTIVATED CLOTTING TIME: 197 s
ACTIVATED CLOTTING TIME: 213 s
ACTIVATED CLOTTING TIME: 197 s
Activated Clotting Time: 180 seconds
Activated Clotting Time: 186 seconds
Activated Clotting Time: 191 seconds
Activated Clotting Time: 191 seconds
Activated Clotting Time: 208 seconds

## 2016-12-29 LAB — CBC
HEMATOCRIT: 35.3 % — AB (ref 39.0–52.0)
HEMOGLOBIN: 10.6 g/dL — AB (ref 13.0–17.0)
MCH: 29.7 pg (ref 26.0–34.0)
MCHC: 30 g/dL (ref 30.0–36.0)
MCV: 98.9 fL (ref 78.0–100.0)
Platelets: 178 10*3/uL (ref 150–400)
RBC: 3.57 MIL/uL — AB (ref 4.22–5.81)
RDW: 16.5 % — ABNORMAL HIGH (ref 11.5–15.5)
WBC: 14.3 10*3/uL — ABNORMAL HIGH (ref 4.0–10.5)

## 2016-12-29 LAB — PHOSPHORUS
PHOSPHORUS: 2.4 mg/dL — AB (ref 2.5–4.6)
Phosphorus: 2.2 mg/dL — ABNORMAL LOW (ref 2.5–4.6)

## 2016-12-29 LAB — BRAIN NATRIURETIC PEPTIDE: B Natriuretic Peptide: 236.2 pg/mL — ABNORMAL HIGH (ref 0.0–100.0)

## 2016-12-29 LAB — ECHOCARDIOGRAM COMPLETE
Height: 74 in
Weight: 4285.74 oz

## 2016-12-29 LAB — APTT: APTT: 193 s — AB (ref 24–36)

## 2016-12-29 LAB — ALBUMIN: ALBUMIN: 2.7 g/dL — AB (ref 3.5–5.0)

## 2016-12-29 MED ORDER — DEXTROSE 5 % IV SOLN
1.0000 g | INTRAVENOUS | Status: AC
  Administered 2016-12-29: 1 g via INTRAVENOUS
  Filled 2016-12-29: qty 10

## 2016-12-29 MED ORDER — CHLORHEXIDINE GLUCONATE 0.12 % MT SOLN
OROMUCOSAL | Status: AC
  Filled 2016-12-29: qty 15

## 2016-12-29 MED ORDER — PERFLUTREN LIPID MICROSPHERE
1.0000 mL | INTRAVENOUS | Status: AC | PRN
  Administered 2016-12-29: 2 mL via INTRAVENOUS
  Filled 2016-12-29: qty 10

## 2016-12-29 MED ORDER — DEXTROSE 5 % IV SOLN
2.0000 g | INTRAVENOUS | Status: DC
  Filled 2016-12-29: qty 2

## 2016-12-29 MED ORDER — DEXTROSE 5 % IV SOLN
2.0000 g | INTRAVENOUS | Status: DC
  Administered 2016-12-30 – 2017-01-02 (×4): 2 g via INTRAVENOUS
  Filled 2016-12-29 (×5): qty 2

## 2016-12-29 MED ORDER — PRISMASOL BGK 4/2.5 32-4-2.5 MEQ/L IV SOLN
INTRAVENOUS | Status: DC
  Administered 2016-12-29 – 2016-12-30 (×2): via INTRAVENOUS_CENTRAL
  Filled 2016-12-29 (×3): qty 5000

## 2016-12-29 MED ORDER — DEXTROSE 5 % IV SOLN
1.0000 g | INTRAVENOUS | Status: DC
  Administered 2016-12-29: 1 g via INTRAVENOUS
  Filled 2016-12-29: qty 10

## 2016-12-29 NOTE — Progress Notes (Signed)
  Echocardiogram 2D Echocardiogram has been performed.  Christian Garner 12/29/2016, 9:35 AM

## 2016-12-29 NOTE — Care Management Note (Addendum)
Case Management Note  Patient Details  Name: Christian Garner MRN: [id number] Date of Birth: 02-06-1946  Subjective/Objective:    Hypoxic respiratory failure, acute kidney failure, CHF, from home with wife, Christian Garner, he is on CRRT, which has pulled off 150-200, intubated on vent on 12/26/16, which he is weaning, will need to get more volume off. He has pl effusion. May need R thorancetesis, has tube feeds through the cortrak at 43, on fenatly, versed and neo drip.  NCM will cont to follow for dc needs.     3/22 [place], BSN- will try to extubate today, will cont to diurese.  NCM will cont to follow patient's progress.             Action/Plan:   Expected Discharge Date:                  Expected Discharge Plan:     In-House Referral:     Discharge planning Services  CM Consult  Post Acute Care Choice:    Choice offered to:     DME Arranged:    DME Agency:     HH Arranged:    HH Agency:     Status of Service:  In process, will continue to follow  If discussed at Long Length of Stay Meetings, dates discussed:    Additional Comments:  Christian Mayo, RN 12/29/2016, 12:30 PM

## 2016-12-29 NOTE — Progress Notes (Signed)
[facility] Progress Note Patient Name: Christian Garner DOB: 1946-10-05 MRN: [id number]   Date of Service  12/29/2016  HPI/Events of Note  PTT = 193.  eICU Interventions  Hold AM Heparin [place] dose.     Intervention Category Intermediate Interventions: Diagnostic test evaluation  Sommer,Steven Cornelia Copa 12/29/2016, 5:23 AM

## 2016-12-29 NOTE — Progress Notes (Addendum)
PULMONARY / CRITICAL CARE MEDICINE   Name: Christian Garner MRN: [id number] DOB: May 11, 1946    ADMISSION DATE:  12/26/2016 CONSULTATION DATE:  12/26/16  REFERRING MD:  Tilda Burrow  CHIEF COMPLAINT:  Hypoxic respiratory failure, acute kidney failure  HISTORY OF PRESENT ILLNESS:   Mr. Christian Garner is a 71 y/o man with PMH of CHF with EF of ~30%, CAD with multiple stents, most recently 6/17 (DES), DM2 on metformin and januvia, LE edema and cellulitis currently on bactrim.  He was recently hospitalized at [facility] for CHF exacerbation and LE cellulitis, discharged on March 1st.  He was sent home on lasix, metolazone (weekly), bactrim, lisinopril, carvedilol and metformin.  He continued to have significant orthopnea after discharge and his family reports he has been unable to sleep because he has been unable to lay flat.  He went to the [facility] ED on 12/25/16 with worsening shortness of breath and was found to have an SpO2 in the low to mid 80s on room air as well as a potassium of 8.5, Cr of 3.7 increased from 1.5 at discharge and a right sided pleural effusion.  His ABG was 7.15/54 and he was started on bipap and a bicarb gtt.  He was also given albuterol, calcium, insulin and glucose and kayexalate for his hyperkalemia.  His shortness of breath improved on BiPap and he was transferred to [name] for further care.   SUBJECTIVE:  Neg 3 liters, slight rise neo, ptt up, sub q hep held Less abdo distention TF tolerated  VITAL SIGNS: BP 121/61   Pulse 76   Temp 98.6 F (37 C) (Oral)   Resp (!) 23   Ht 6\' 2"  (1.88 m)   Wt 121.5 kg (267 lb 13.7 oz)   SpO2 100%   BMI 34.39 kg/m   HEMODYNAMICS:    VENTILATOR SETTINGS: Vent Mode: PRVC FiO2 (%):  [40 %] 40 % Set Rate:  [22 bmp] 22 bmp Vt Set:  [600 mL-660 mL] 660 mL PEEP:  [5 cmH20] 5 cmH20 Pressure Support:  [5 cmH20] 5 cmH20 Plateau Pressure:  [18 cmH20-21 cmH20] 21 cmH20  INTAKE / OUTPUT: I/O last 3 completed  shifts: In: 2794.4 [I.V.:1516.7; NG/GT:827.7; IV Piggyback:450] Out: 7726 [Urine:410; Emesis/NG output:100; Other:7216]  PHYSICAL EXAMINATION: General:  Calm, sedated, supine in bed, CVVH ongoing Neuro:  Alert and oriented x3, moving all ext to command HEENT:  PERL Cardiovascular:  s1 s2 RRR, II/VI systolic murmur, no gallop, no rub Lungs:  Coarse BS bilaterally improving, reduced bases Abdomen:  Distended, soft, non-tender, hypo BS Musculoskeletal:  3-4+ LE pitting edema Skin:  LE erythematous from mid shin to ankles, scaly skin, healing ulcers.   LABS:  BMET  Recent Labs Lab 12/28/16 0331 12/28/16 1532 12/29/16 0355  NA 137 138 138  K 4.8 4.8 4.4  CL 102 100* 101  CO2 26 27 28   BUN 35* 30* 29*  CREATININE 2.64* 2.04* 1.63*  GLUCOSE 105* 129* 141*    Electrolytes  Recent Labs Lab 12/28/16 0331 12/28/16 1036 12/28/16 1532 12/29/16 0355  CALCIUM 8.3*  --  8.2* 8.2*  MG 2.6* 2.5* 2.7* 2.7*  PHOS 4.2 3.9 3.8  3.7 2.4*    CBC  Recent Labs Lab 12/27/16 0241 12/27/16 1351 12/28/16 0331 12/29/16 0355  WBC 20.2*  --  19.5* 14.3*  HGB 12.0* 12.6* 11.6* 10.6*  HCT 39.0 37.0* 38.4* 35.3*  PLT 266  --  213 178    Coag's  Recent Labs Lab  12/26/16 0716 12/29/16 0355  APTT  --  193*  INR 1.24  --     Sepsis Markers  Recent Labs Lab 12/26/16 0716 12/26/16 1029  LATICACIDVEN 1.6 1.5  PROCALCITON 0.11  --     ABG  Recent Labs Lab 12/26/16 0355 12/26/16 1500 12/28/16 0455  PHART 7.265* 7.335* 7.311*  PCO2ART 51.2* 46.0 51.9*  PO2ART 113* 253* 66.0*   Liver Enzymes  Recent Labs Lab 12/26/16 0716  12/28/16 0331 12/28/16 1532 12/29/16 0355  AST 23  --   --   --   --   ALT 19  --   --   --   --   ALKPHOS 63  --   --   --   --   BILITOT 0.4  --   --   --   --   ALBUMIN 3.5  < > 3.0* 2.9* 2.7*  < > = values in this interval not displayed.  Cardiac Enzymes  Recent Labs Lab 12/26/16 0716 12/26/16 1632 12/26/16 2230  TROPONINI 0.05*  0.06* 0.07*    Glucose  Recent Labs Lab 12/28/16 1122 12/28/16 1500 12/28/16 2009 12/29/16 0011 12/29/16 0356 12/29/16 0751  GLUCAP 102* 128* 112* 126* 136* 132*   Imaging Dg Chest Port 1 View  Result Date: 12/29/2016 CLINICAL DATA:  71 year old male with respiratory failure. Subsequent encounter. EXAM: PORTABLE CHEST 1 VIEW COMPARISON:  12/28/2016. FINDINGS: Endotracheal tube tip 2.9 cm above the carina. Left central line tip projects at the level of the proximal superior vena cava directed slightly laterally possibly impressing upon lateral wall of the superior vena cava without change. Nasogastric tube courses below the diaphragm. Tip is not included on the present exam. Asymmetric airspace disease greater on right with right-sided pleural effusion. This may represent result of pulmonary edema. Difficult to exclude infectious infiltrate within the right lung in the proper clinical setting. Heart is difficult to adequately assess. Calcified aorta. IMPRESSION: Overall no significant change in asymmetric airspace disease greater on right with right-sided pleural effusion suggestive of pulmonary edema. Left central line tip projects at the level of the proximal superior vena cava directed slightly laterally possibly impressing upon lateral wall of the superior vena cava without change. Electronically Signed   By: Genia Del M.D.   On: 12/29/2016 08:27   STUDIES:  CXR 12/26/16 - right sided pleural effusion CXR 12/28/16- underlying CHF, continued R pleural effusion,? Right infiltration  CULTURES: Urine, blood (at [place]) Urine 3/19>> Sputun 3/19>>  ANTIBIOTICS: Vanc 12/26/16 -->3/19 Cefepime 12/26/16 --->  SIGNIFICANT EVENTS: CRRT 3/17  LINES/TUBES: ETT 3/17>>> L IJ trialysis catheter 3/17>>> OGT 3/17>>>  DISCUSSION: 71 y/o presenting with hypoxic, hypercarbic respiratory failure, AKI, hyperkalemia and possible sepsis  ASSESSMENT / PLAN:  PULMONARY A: Respiratory  failure Right sided effusion P:   Weaning ps 5, cpap 5 goal again today 2 hours I feel we likely need further neg balance prior to extubation attempts Continued neg balance, and follow pcxr effusion in am   CARDIOVASCULAR A:  CHF Circulatory shock P:  Continuous Tele monitoring Echo pending 3/20, required Fluid removal via CRRT( Goal 150-200/ hr per renal)- keep this approach Neo for BP support ( Goal MAP > 60  RENAL A:   AKI Hyperkalemia hematuria P:   cvvhd to neg balance 3 liters in 24 hours again as goal Urology consult called to [facility], appreciate input  HEMATOLOGIC A:   Chronic anemia Hamaturia ptt prolonged on IV hep cvvhd P:  On iron supplements -  hold while NPO Transfuse for HGB < 7 ANCA and anti-GBM- pending but complements negative coags again in am , holding sub q hep  INFECTIOUS A:   UA with 20-30WBC and trace LE LE cellulitis Leukocytosis P:   cefepime dc, remains culture neg, add ctx, plan 7-8 days abx F/u on cultures to final  GI A: Distended quiet abdomen OG to suction>> 100 cc bilious last 24 hours KUB 3/18>>  bowel gas pattern is unremarkable. P: Tolerating TF to goal  ENDOCRINE A:   DM2   P:   SSI per glycemic protocol CBGs Q 4 Controlled well  FAMILY  - Updates: No family bedside.  - Inter-disciplinary family meet or Palliative Care meeting due by:  01/02/17  Ccm time 30 min   Lavon Paganini. Titus Mould, MD, [place] Pgr: [facility] 12/29/2016 8:30 AM

## 2016-12-29 NOTE — Progress Notes (Signed)
CRITICAL VALUE ALERT  Critical value received:  PTT 193  Date of notification:  12/29/16  Time of notification: 0500  Critical value read back: Yes  Nurse who received alert:  Norberta Keens, RN  MD notified (1st page):  Quentin Ore)  Time of first page: [phone]  MD notified (2nd page):  Time of second page:  Responding MD:  Oletta Darter  Time MD responded:  [phone]   Verbal order received to hold pt's SQ heparin scheduled for 0600. Will implement and continue to monitor pt closely.

## 2016-12-29 NOTE — Progress Notes (Signed)
Patient ID: Christian Garner, male   DOB: [birth year], 71 y.o.   MRN: [id number] [facility] Progress Note   Assessment/ Plan:   1. Acute kidney injury: Suspected to be hemodynamically mediated from CHF exacerbation in the face of ongoing ACE inhibitor/diuretic therapy and likely compounded by Bactrim. Creatinine 2 weeks ago 1.5.  Initiated on CRRT 3/17 because of hemodynamic instability for the goal to treat hyperkalemia and to engage in ultrafiltration. Able to tolerate this so far. -continue UF  but if pressor dose needing up through day may need to back off on UF. 0K bath for some- change to all 4 K.  UOP increasing ??? 2. Hyperkalemia:  poorly responsive to medical management-started on CRRT for extracorporeal removal. Better- change to all 4 K baths 3.  Respiratory failure: Suspected to be secondary to CHF exacerbation, monitor with ultrafiltration by CRRT. Minus 3200 last 24 hours and continued edema- overall 8 kg down 4. Hematuria: Seen earlier by urology, serologies negative 5. Machine clotting- added systemic heparin  6. Anemia- hgb falling slowly- watch   Subjective:    ultrafiltration continues  but pressors up some overnight- now back down.  Filter better with systemic heparin- a little more UOP this AM- 310 last 24 hours      Objective:   BP 121/61   Pulse 76   Temp 98.6 F (37 C) (Oral)   Resp (!) 23   Ht 6' 2"  (1.88 m)   Wt 121.5 kg (267 lb 13.7 oz)   SpO2 100%   BMI 34.39 kg/m   Intake/Output Summary (Last 24 hours) at 12/29/16 8127 Last data filed at 12/29/16 0800  Gross per 24 hour  Intake          1784.21 ml  Output             4970 ml  Net         -3185.79 ml   Weight change: -3.1 kg (-6 lb 13.4 oz)  Physical Exam: [id number] on ventilator, right neck hematoma- left IJ vascath placed 3/17  CVS: Pulse regular rhythm, normal rate, normal S1 and S2 Resp: Coarse breath sounds bilaterally-no rales Abd: Soft, obese, nontender Ext: Through-3+  lower extremity edema  Imaging: Dg Chest Port 1 View  Result Date: 12/29/2016 CLINICAL DATA:  71 year old male with respiratory failure. Subsequent encounter. EXAM: PORTABLE CHEST 1 VIEW COMPARISON:  12/28/2016. FINDINGS: Endotracheal tube tip 2.9 cm above the carina. Left central line tip projects at the level of the proximal superior vena cava directed slightly laterally possibly impressing upon lateral wall of the superior vena cava without change. Nasogastric tube courses below the diaphragm. Tip is not included on the present exam. Asymmetric airspace disease greater on right with right-sided pleural effusion. This may represent result of pulmonary edema. Difficult to exclude infectious infiltrate within the right lung in the proper clinical setting. Heart is difficult to adequately assess. Calcified aorta. IMPRESSION: Overall no significant change in asymmetric airspace disease greater on right with right-sided pleural effusion suggestive of pulmonary edema. Left central line tip projects at the level of the proximal superior vena cava directed slightly laterally possibly impressing upon lateral wall of the superior vena cava without change. Electronically Signed   By: Genia Del M.D.   On: 12/29/2016 08:27   Dg Chest Port 1 View  Result Date: 12/28/2016 CLINICAL DATA:  Hypoxia EXAM: PORTABLE CHEST 1 VIEW COMPARISON:  December 27, 2016 FINDINGS: Endotracheal tube tip is 3.4 cm above the carina. Nasogastric  tube tip and side port below the diaphragm. Central catheter tip is at the junction of the left innominate vein and superior vena cava. No pneumothorax. There is persistent pleural effusion on the right with consolidation in the right base region. There is atelectatic change in the left base. Left lung is otherwise clear. There is cardiomegaly with pulmonary venous hypertension. There is atherosclerotic calcification aorta. No evident adenopathy. No bone lesions. IMPRESSION: Findings suspicious for  a degree of underlying congestive heart failure. Sizable pleural effusion on the right with consolidation in the right base. There may be superimposed pneumonia in the right base. There is mild atelectasis in the left base. There is aortic atherosclerosis. Tube and catheter positions as described without evident pneumothorax. Electronically Signed   By: Lowella Grip III M.D.   On: 12/28/2016 07:21   Dg Abd Portable 1v  Result Date: 12/27/2016 CLINICAL DATA:  71 year old male status post nasogastric tube placement EXAM: PORTABLE ABDOMEN - 1 VIEW COMPARISON:  chest x-ray obtained earlier today FINDINGS: A nasogastric tube is present. The tip projects over the region of the gastric antrum. The bowel gas pattern is unremarkable. Incompletely imaged airspace disease in the right lung with probable large effusion. No acute osseous abnormality. Mild multilevel degenerative changes throughout the visualized spine. IMPRESSION: 1. The tip of the nasogastric tube projects over the gastric antrum. 2. Incompletely imaged right pleural effusion and associated airspace opacity. Electronically Signed   By: Jacqulynn Cadet M.D.   On: 12/27/2016 09:41    Labs: BMET  Recent Labs Lab 12/26/16 1805 12/26/16 2316 12/27/16 0241 12/27/16 1346 12/27/16 1351 12/28/16 0331 12/28/16 1036 12/28/16 1532 12/29/16 0355  NA 137 133* 138 135 139 137  --  138 138  K 6.7* 5.7* 5.8* 4.8 4.9 4.8  --  4.8 4.4  CL 102 99* 101 100* 101 102  --  100* 101  CO2 26 26 29 26   --  26  --  27 28  GLUCOSE 156* 125* 126* 131* 129* 105*  --  129* 141*  BUN 86* 70* 66* 46* 46* 35*  --  30* 29*  CREATININE 3.41* 2.90* 3.20* 2.79* 2.50* 2.64*  --  2.04* 1.63*  CALCIUM 8.5* 8.0* 8.3* 8.1*  --  8.3*  --  8.2* 8.2*  PHOS 5.8*  --  4.9*  5.1* 4.8*  --  4.2 3.9 3.8  3.7 2.4*   CBC  Recent Labs Lab 12/26/16 0716 12/27/16 0241 12/27/16 1351 12/28/16 0331 12/29/16 0355  WBC 15.5* 20.2*  --  19.5* 14.3*  NEUTROABS 12.5*  --   --    --   --   HGB 12.0* 12.0* 12.6* 11.6* 10.6*  HCT 39.4 39.0 37.0* 38.4* 35.3*  MCV 98.7 99.0  --  99.0 98.9  PLT 234 266  --  213 178   Medications:    . ceFEPime (MAXIPIME) IV  2 g Intravenous Q12H  . chlorhexidine gluconate (MEDLINE KIT)  15 mL Mouth Rinse BID  . Chlorhexidine Gluconate Cloth  6 each Topical Daily  . clopidogrel  75 mg Oral Daily  . feeding supplement (PRO-STAT SUGAR FREE 64)  60 mL Per Tube BID  . feeding supplement (VITAL HIGH PROTEIN)  1,000 mL Per Tube Q24H  . fentaNYL (SUBLIMAZE) injection  400 mcg Intravenous Once  . heparin subcutaneous  5,000 Units Subcutaneous Q8H  . heparin lock flush  1,000 Units Intravenous Once  . insulin aspart  2-6 Units Subcutaneous Q4H  . mouth rinse  15 mL Mouth Rinse QID  . pantoprazole (PROTONIX) IV  40 mg Intravenous Daily  . sodium chloride flush  10 mL Intravenous Q12H  . sodium chloride flush  3 mL Intravenous Q12H   Deaglan Lile A  12/29/2016, 8:39 AM

## 2016-12-30 ENCOUNTER — Inpatient Hospital Stay (HOSPITAL_COMMUNITY): Payer: Medicare Other

## 2016-12-30 DIAGNOSIS — J90 Pleural effusion, not elsewhere classified: Secondary | ICD-10-CM

## 2016-12-30 LAB — GRAM STAIN

## 2016-12-30 LAB — MAGNESIUM: Magnesium: 2.7 mg/dL — ABNORMAL HIGH (ref 1.7–2.4)

## 2016-12-30 LAB — RENAL FUNCTION PANEL
ANION GAP: 10 (ref 5–15)
Albumin: 2.6 g/dL — ABNORMAL LOW (ref 3.5–5.0)
Albumin: 2.3 g/dL — ABNORMAL LOW (ref 3.5–5.0)
Anion gap: 9 (ref 5–15)
BUN: 26 mg/dL — ABNORMAL HIGH (ref 6–20)
BUN: 45 mg/dL — ABNORMAL HIGH (ref 6–20)
CHLORIDE: 101 mmol/L (ref 101–111)
CHLORIDE: 102 mmol/L (ref 101–111)
CO2: 27 mmol/L (ref 22–32)
CO2: 25 mmol/L (ref 22–32)
Calcium: 8.3 mg/dL — ABNORMAL LOW (ref 8.9–10.3)
Calcium: 8.3 mg/dL — ABNORMAL LOW (ref 8.9–10.3)
Creatinine, Ser: 1.29 mg/dL — ABNORMAL HIGH (ref 0.61–1.24)
Creatinine, Ser: 1.83 mg/dL — ABNORMAL HIGH (ref 0.61–1.24)
GFR calc Af Amer: >60 mL/min (ref >60)
GFR calc non Af Amer: 54 mL/min — ABNORMAL LOW (ref >60)
GFR, EST AFRICAN AMERICAN: 41 mL/min — AB (ref >60)
GFR, EST NON AFRICAN AMERICAN: 35 mL/min — AB (ref >60)
GLUCOSE: 152 mg/dL — AB (ref 65–99)
Glucose, Bld: 183 mg/dL — ABNORMAL HIGH (ref 65–99)
POTASSIUM: 4.3 mmol/L (ref 3.5–5.1)
POTASSIUM: 4 mmol/L (ref 3.5–5.1)
Phosphorus: 1.8 mg/dL — ABNORMAL LOW (ref 2.5–4.6)
Phosphorus: 2.9 mg/dL (ref 2.5–4.6)
Sodium: 137 mmol/L (ref 135–145)
Sodium: 137 mmol/L (ref 135–145)

## 2016-12-30 LAB — LACTATE DEHYDROGENASE, PLEURAL OR PERITONEAL FLUID: LD, Fluid: 96 U/L — ABNORMAL HIGH (ref 3–23)

## 2016-12-30 LAB — CULTURE, GROUP A STREP (THRC)

## 2016-12-30 LAB — GLUCOSE, CAPILLARY
GLUCOSE-CAPILLARY: 140 mg/dL — AB (ref 65–99)
GLUCOSE-CAPILLARY: 185 mg/dL — AB (ref 65–99)
Glucose-Capillary: 122 mg/dL — ABNORMAL HIGH (ref 65–99)
Glucose-Capillary: 187 mg/dL — ABNORMAL HIGH (ref 65–99)
Glucose-Capillary: 179 mg/dL — ABNORMAL HIGH (ref 65–99)
Glucose-Capillary: 161 mg/dL — ABNORMAL HIGH (ref 65–99)

## 2016-12-30 LAB — BODY FLUID CELL COUNT WITH DIFFERENTIAL
Eos, Fluid: 0 %
LYMPHS FL: 35 %
MONOCYTE-MACROPHAGE-SEROUS FLUID: 28 % — AB (ref 50–90)
Neutrophil Count, Fluid: 37 % — ABNORMAL HIGH (ref 0–25)
OTHER CELLS FL: 0 %
Total Nucleated Cell Count, Fluid: 565 cu mm (ref 0–1000)

## 2016-12-30 LAB — APTT: APTT: 176 s — AB (ref 24–36)

## 2016-12-30 LAB — POCT ACTIVATED CLOTTING TIME
ACTIVATED CLOTTING TIME: 197 s
ACTIVATED CLOTTING TIME: 191 s

## 2016-12-30 LAB — CULTURE, RESPIRATORY: CULTURE: NO GROWTH

## 2016-12-30 LAB — PROTIME-INR
INR: 1.19
Prothrombin Time: 15.2 seconds (ref 11.4–15.2)

## 2016-12-30 LAB — LACTATE DEHYDROGENASE: LDH: 179 U/L (ref 98–192)

## 2016-12-30 LAB — CHOLESTEROL, TOTAL: Cholesterol: 97 mg/dL (ref 0–200)

## 2016-12-30 LAB — PROTEIN, TOTAL: TOTAL PROTEIN: 5.8 g/dL — AB (ref 6.5–8.1)

## 2016-12-30 LAB — CULTURE, RESPIRATORY W GRAM STAIN: Special Requests: NORMAL

## 2016-12-30 MED ORDER — ALTEPLASE 2 MG IJ SOLR
2.0000 mg | Freq: Once | INTRAMUSCULAR | Status: AC
  Administered 2016-12-30: 2 mg
  Filled 2016-12-30: qty 2

## 2016-12-30 MED ORDER — MIDAZOLAM HCL 2 MG/2ML IJ SOLN
1.0000 mg | INTRAMUSCULAR | Status: DC | PRN
  Administered 2016-12-31 (×3): 2 mg via INTRAVENOUS
  Filled 2016-12-30 (×3): qty 2

## 2016-12-30 MED ORDER — ETOMIDATE 2 MG/ML IV SOLN
INTRAVENOUS | Status: AC
  Administered 2016-12-30: 20 mg
  Filled 2016-12-30: qty 10

## 2016-12-30 MED ORDER — HEPARIN SODIUM (PORCINE) 5000 UNIT/ML IJ SOLN
5000.0000 [IU] | Freq: Three times a day (TID) | INTRAMUSCULAR | Status: DC
  Administered 2016-12-30 – 2017-01-05 (×17): 5000 [IU] via SUBCUTANEOUS
  Filled 2016-12-30 (×18): qty 1

## 2016-12-30 MED ORDER — SODIUM PHOSPHATES 45 MMOLE/15ML IV SOLN
30.0000 mmol | Freq: Once | INTRAVENOUS | Status: AC
  Administered 2016-12-30: 30 mmol via INTRAVENOUS
  Filled 2016-12-30: qty 10

## 2016-12-30 NOTE — Progress Notes (Signed)
Patient ID: Christian Garner, male   DOB: 1946/01/04, 71 y.o.   MRN: [id number] [facility] Progress Note   Assessment/ Plan:   1. Acute kidney injury: Suspected to be hemodynamically mediated from CHF exacerbation in the face of ongoing ACE inhibitor/diuretic therapy and likely compounded by Bactrim. Creatinine 2.5 weeks ago was 1.5.  Initiated on CRRT 3/17 because of hemodynamic instability for the goal to treat hyperkalemia and to engage in ultrafiltration. Able to tolerate this so far- minus 10 liters. Now with CVP 11- minimal O2 req and better hemodynamics- I actually want for him to take a CRRT holiday- see what his UOP/BP does- if he needs further HD he could maybe tolerate intermittent HD ?  But given baseline creatinine 1.5 he could also maybe show some signs of recovery  2. Hyperkalemia:   resolved 3.  Respiratory failure: Suspected to be secondary to CHF exacerbation,  Minus 4000 last 24 hours and continued edema (but albumin of 2.6- likely third spacing)- overall 10 kg down- has effusion but dialysis not always great at clearing those 4. Hematuria: Seen earlier by urology, serologies negative 5. Machine clotting- added systemic heparin  6. Anemia- hgb falling slowly- watch   Subjective:    ultrafiltration continues  - actually removed 4 liters last 24 hours- off pressors just this AM-  220 UOP last 24 hours      Objective:   BP 139/68   Pulse 89   Temp 98.3 F (36.8 C) (Oral)   Resp 20   Ht _0  (1.88 m)   Wt 117.8 kg (259 lb 11.2 oz)   SpO2 97%   BMI 33.34 kg/m   Intake/Output Summary (Last 24 hours) at 12/30/16 0845 Last data filed at 12/30/16 0800  Gross per 24 hour  Intake          2075.57 ml  Output             6365 ml  Net         -4289.43 ml   Weight change: -3.7 kg (-8 lb 2.5 oz)  Physical Exam: [id number] on ventilator, right neck hematoma- left IJ vascath placed 3/17  CVS: Pulse regular rhythm, normal rate, normal S1 and S2 Resp: Coarse  breath sounds bilaterally-no rales Abd: Soft, obese, nontender Ext: Through-2+ lower extremity edema  Imaging: Dg Chest Port 1 View  Result Date: 12/29/2016 CLINICAL DATA:  71 year old male with respiratory failure. Subsequent encounter. EXAM: PORTABLE CHEST 1 VIEW COMPARISON:  12/28/2016. FINDINGS: Endotracheal tube tip 2.9 cm above the carina. Left central line tip projects at the level of the proximal superior vena cava directed slightly laterally possibly impressing upon lateral wall of the superior vena cava without change. Nasogastric tube courses below the diaphragm. Tip is not included on the present exam. Asymmetric airspace disease greater on right with right-sided pleural effusion. This may represent result of pulmonary edema. Difficult to exclude infectious infiltrate within the right lung in the proper clinical setting. Heart is difficult to adequately assess. Calcified aorta. IMPRESSION: Overall no significant change in asymmetric airspace disease greater on right with right-sided pleural effusion suggestive of pulmonary edema. Left central line tip projects at the level of the proximal superior vena cava directed slightly laterally possibly impressing upon lateral wall of the superior vena cava without change. Electronically Signed   By: Genia Del M.D.   On: 12/29/2016 08:27    Labs: BMET  Recent Labs Lab 12/27/16 0241 12/27/16 1346 12/27/16 1351 12/28/16 0331 12/28/16  1036 12/28/16 1532 12/29/16 0355 12/29/16 1548 12/30/16 0400  NA 138 135 139 137  --  138 138 139 137  K 5.8* 4.8 4.9 4.8  --  4.8 4.4 4.5 4.3  CL 101 100* 101 102  --  100* 101 102 101  CO2 29 26  --  26  --  _0 GLUCOSE 126* 131* 129* 105*  --  129* 141* 137* 152*  BUN 66* 46* 46* 35*  --  30* 29* 26* 26*  CREATININE 3.20* 2.79* 2.50* 2.64*  --  2.04* 1.63* 1.27* 1.29*  CALCIUM 8.3* 8.1*  --  8.3*  --  8.2* 8.2* 8.3* 8.3*  PHOS 4.9*  5.1* 4.8*  --  4.2 3.9 3.8  3.7 2.4* 2.2*  2.5 1.8*    CBC  Recent Labs Lab 12/26/16 0716 12/27/16 0241 12/27/16 1351 12/28/16 0331 12/29/16 0355  WBC 15.5* 20.2*  --  19.5* 14.3*  NEUTROABS 12.5*  --   --   --   --   HGB 12.0* 12.0* 12.6* 11.6* 10.6*  HCT 39.4 39.0 37.0* 38.4* 35.3*  MCV 98.7 99.0  --  99.0 98.9  PLT 234 266  --  213 178   Medications:    . cefTRIAXone (ROCEPHIN)  IV  2 g Intravenous Q24H  . chlorhexidine gluconate (MEDLINE KIT)  15 mL Mouth Rinse BID  . Chlorhexidine Gluconate Cloth  6 each Topical Daily  . clopidogrel  75 mg Oral Daily  . feeding supplement (PRO-STAT SUGAR FREE 64)  60 mL Per Tube BID  . feeding supplement (VITAL HIGH PROTEIN)  1,000 mL Per Tube Q24H  . fentaNYL (SUBLIMAZE) injection  400 mcg Intravenous Once  . heparin lock flush  1,000 Units Intravenous Once  . insulin aspart  2-6 Units Subcutaneous Q4H  . mouth rinse  15 mL Mouth Rinse QID  . pantoprazole (PROTONIX) IV  40 mg Intravenous Daily  . sodium chloride flush  10 mL Intravenous Q12H  . sodium chloride flush  3 mL Intravenous Q12H   Kathleena Freeman A  12/30/2016, 8:45 AM

## 2016-12-30 NOTE — Procedures (Signed)
[place] chest  1 large free flowing effusion rt, severe atx  Lavon Paganini. Titus Mould, MD, [place] Pgr: [facility]

## 2016-12-30 NOTE — Progress Notes (Addendum)
PULMONARY / CRITICAL CARE MEDICINE   Name: Christian Garner MRN: [id number] DOB: [birth year]    ADMISSION DATE:  12/26/2016 CONSULTATION DATE:  12/26/16  REFERRING MD:  Tilda Burrow  CHIEF COMPLAINT:  Hypoxic respiratory failure, acute kidney failure  HISTORY OF PRESENT ILLNESS:   Christian Garner is a 71 y/o man with PMH of CHF with EF of ~30%, CAD with multiple stents, most recently 6/17 (DES), DM2 on metformin and januvia, LE edema and cellulitis currently on bactrim.  He was recently hospitalized at [facility] for CHF exacerbation and LE cellulitis, discharged on March 1st.  He was sent home on lasix, metolazone (weekly), bactrim, lisinopril, carvedilol and metformin.  He continued to have significant orthopnea after discharge and his family reports he has been unable to sleep because he has been unable to lay flat.  He went to the [facility] ED on 12/25/16 with worsening shortness of breath and was found to have an SpO2 in the low to mid 80s on room air as well as a potassium of 8.5, Cr of 3.7 increased from 1.5 at discharge and a right sided pleural effusion.  His ABG was 7.15/54 and he was started on bipap and a bicarb gtt.  He was also given albuterol, calcium, insulin and glucose and kayexalate for his hyperkalemia.  His shortness of breath improved on BiPap and he was transferred to [name] for further care.   SUBJECTIVE:  cvvhd off Neg 4.4 liters  VITAL SIGNS: BP 136/61   Pulse 95   Temp 98.3 F (36.8 C) (Oral)   Resp (!) 25   Ht 6\' 2"  (1.88 m)   Wt 117.8 kg (259 lb 11.2 oz)   SpO2 96%   BMI 33.34 kg/m   HEMODYNAMICS:    VENTILATOR SETTINGS: Vent Mode: PSV;CPAP FiO2 (%):  [30 %-40 %] 30 % Set Rate:  [22 bmp] 22 bmp Vt Set:  [974 mL] 660 mL PEEP:  [5 cmH20] 5 cmH20 Pressure Support:  [5 cmH20] 5 cmH20 Plateau Pressure:  [20 cmH20-21 cmH20] 20 cmH20  INTAKE / OUTPUT: I/O last 3 completed shifts: In: 3325.9 [I.V.:1075.9; NG/GT:2100; IV  Piggyback:150] Out: 1638 [Urine:400; GTXMI:6803]  PHYSICAL EXAMINATION: General:  Calm, sedated, supine in bed, CVVH just off Neuro:  Alert and oriented x3, moving all ext to command, more lethargic today HEENT:  PERL, jvd wnl Cardiovascular:  s1 s2 RRR, II/VI systolic murmur, no gallop, no rub Lungs:  Reduced  bases Abdomen:  Distended, soft, non-tender, hypo BS Musculoskeletal:  2+ LE pitting edema Skin:  LE erythematous from mid shin to ankles, scaly skin, healing ulcers.   LABS:  BMET  Recent Labs Lab 12/29/16 0355 12/29/16 1548 12/30/16 0400  NA 138 139 137  K 4.4 4.5 4.3  CL 101 102 101  CO2 28 28 27   BUN 29* 26* 26*  CREATININE 1.63* 1.27* 1.29*  GLUCOSE 141* 137* 152*    Electrolytes  Recent Labs Lab 12/29/16 0355 12/29/16 1548 12/30/16 0400  CALCIUM 8.2* 8.3* 8.3*  MG 2.7* 2.7* 2.7*  PHOS 2.4* 2.2*  2.5 1.8*    CBC  Recent Labs Lab 12/27/16 0241 12/27/16 1351 12/28/16 0331 12/29/16 0355  WBC 20.2*  --  19.5* 14.3*  HGB 12.0* 12.6* 11.6* 10.6*  HCT 39.0 37.0* 38.4* 35.3*  PLT 266  --  213 178    Coag's  Recent Labs Lab 12/26/16 0716 12/29/16 0355 12/30/16 0400  APTT  --  193* 176*  INR 1.24  --  1.19    Sepsis Markers  Recent Labs Lab 12/26/16 0716 12/26/16 1029  LATICACIDVEN 1.6 1.5  PROCALCITON 0.11  --     ABG  Recent Labs Lab 12/26/16 0355 12/26/16 1500 12/28/16 0455  PHART 7.265* 7.335* 7.311*  PCO2ART 51.2* 46.0 51.9*  PO2ART 113* 253* 66.0*   Liver Enzymes  Recent Labs Lab 12/26/16 0716  12/29/16 0355 12/29/16 1548 12/30/16 0400  AST 23  --   --   --   --   ALT 19  --   --   --   --   ALKPHOS 63  --   --   --   --   BILITOT 0.4  --   --   --   --   ALBUMIN 3.5  < > 2.7* 2.7* 2.6*  < > = values in this interval not displayed.  Cardiac Enzymes  Recent Labs Lab 12/26/16 0716 12/26/16 1632 12/26/16 2230  TROPONINI 0.05* 0.06* 0.07*    Glucose  Recent Labs Lab 12/29/16 0751 12/29/16 1141  12/29/16 1600 12/29/16 1917 12/29/16 2327 12/30/16 0337  GLUCAP 132* 138* 121* 150* 113* 140*   Imaging Dg Chest Port 1 View  Result Date: 12/30/2016 CLINICAL DATA:  Ventilator EXAM: PORTABLE CHEST 1 VIEW COMPARISON:  12/29/2016 FINDINGS: Support devices are stable. Continued diffuse right lung airspace disease with moderate right pleural effusion. No confluent opacity on the left. Heart is borderline in size. IMPRESSION: Stable diffuse right lung airspace disease and moderate right effusion. Support devices stable. Electronically Signed   By: Rolm Baptise M.D.   On: 12/30/2016 08:43   STUDIES:  CXR 12/26/16 - right sided pleural effusion CXR 12/28/16- underlying CHF, continued R pleural effusion,? Right infiltration Echo 3/20- 30-35%, mild mod MR  CULTURES: Urine, blood (at [facility]) Urine 3/19>> Sputun 3/19>>  ANTIBIOTICS: Vanc 12/26/16 -->3/19 Cefepime 12/26/16 --->  SIGNIFICANT EVENTS: CRRT 3/17  LINES/TUBES: ETT 3/17>>> L IJ trialysis catheter 3/17>>> OGT 3/17>>>  DISCUSSION: 71 y/o presenting with hypoxic, hypercarbic respiratory failure, AKI, hyperkalemia and possible sepsis  ASSESSMENT / PLAN:  PULMONARY A: Respiratory failure Right sided effusion P:   Weaning ps 5, cpap 5 goal again  Was successful neg 4.4 liters Effusion remains despite, I will [place] this and likely there and diag tap  CARDIOVASCULAR A:  CHF Circulatory shock CHF acute sys 30-35%, mod MR P:  Continuous Tele monitoring Echo pending 3/20, required- reviewed He came off neo, not suprised with neg balance and nwo noted sys CHF  RENAL A:   AKI Hyperkalemia hematuria P:   cvvhd off Lasix? As made 100 cc  HEMATOLOGIC A:   Chronic anemia Hamaturia ptt prolonged on IV hep cvvhd P:  On iron supplements - hold while NPO Re add sub q hep ads cvvhd off  INFECTIOUS A:   UA with 20-30WBC and trace LE LE cellulitis Leukocytosis P:   cefepime to stop date  GI A: Distended quiet  abdomen OG to suction>> 100 cc bilious last 24 hours KUB 3/18>>  bowel gas pattern is unremarkable. P: Tolerating TF to goal  hold if weaning well  ENDOCRINE A:   DM2   P:   SSI per glycemic protocol CBGs Q 4 Controlled well  NEURO Dc versed drip need wua fent reduction   FAMILY  - Updates: No family bedside.  - Inter-disciplinary family meet or Palliative Care meeting due by:  01/02/17  Ccm time 30 min   Lavon Paganini. Titus Mould, MD, [place] Pgr: [phone] [place] [facility] 12/30/2016 9:40 AM

## 2016-12-30 NOTE — Progress Notes (Signed)
MD aware of bloody urine, will continue to monitor.  Rowe Pavy, RN

## 2016-12-30 NOTE — Procedures (Signed)
Thoracentesis Procedure Note  Pre-operative Diagnosis: large rt effusion  Post-operative Diagnosis: same  Indications: diag, ther  Procedure Details  Consent: Informed consent was obtained. Risks of the procedure were discussed including: infection, bleeding, pain, pneumothorax.  Under sterile conditions the patient was positioned. Betadine solution and sterile drapes were utilized.  1% buffered lidocaine was used to anesthetize the 6th  rib space. Fluid was obtained without any difficulties and minimal blood loss.  A dressing was applied to the wound and wound care instructions were provided.   Findings 2000 ml of clear pleural fluid was obtained. A sample was sent to Pathology for cytogenetics, flow, and cell counts, as well as for infection analysis.  Complications:  None; patient tolerated the procedure well.          Condition: stable  Plan A follow up chest x-ray was ordered. Bed Rest for 1 hours. Tylenol 650 mg. for pain.  Attending Attestation: I performed the procedure. And did US guidance  Lavon Paganini. Titus Mould, MD, [place] [address] Pgr: [place] [facility]

## 2016-12-30 NOTE — Progress Notes (Signed)
CRITICAL VALUE ALERT  Critical value received:  PTT 176  Date of notification:  3/21//18  Time of notification: 0502  Critical value read back: Yes  Nurse who received alert: Norberta Keens, RN  MD notified (1st page):  Dr. Oletta Darter Warren Lacy)  Time of first page:  0505  MD notified (2nd page):  Time of second page:  Responding MD:  Dr. Oletta Darter  Time MD responded:  0505  No new orders at this time. Will update renal MD.

## 2016-12-30 NOTE — Progress Notes (Signed)
Called and obtained verbal consent over the phone with wife Lowry Bala for a right thoracentesis, Laurena Spies RN was the 2nd RN to verify.  Rowe Pavy, RN

## 2016-12-30 NOTE — Progress Notes (Addendum)
62mls of versed wasted in sink with Laurena Spies, RN.  Rowe Pavy, RN

## 2016-12-31 ENCOUNTER — Inpatient Hospital Stay (HOSPITAL_COMMUNITY): Payer: Medicare Other

## 2016-12-31 LAB — RENAL FUNCTION PANEL
Albumin: 2.6 g/dL — ABNORMAL LOW (ref 3.5–5.0)
Anion gap: 12 (ref 5–15)
BUN: 60 mg/dL — ABNORMAL HIGH (ref 6–20)
CHLORIDE: 100 mmol/L — AB (ref 101–111)
CO2: 27 mmol/L (ref 22–32)
Calcium: 8.8 mg/dL — ABNORMAL LOW (ref 8.9–10.3)
Creatinine, Ser: 1.86 mg/dL — ABNORMAL HIGH (ref 0.61–1.24)
GFR calc Af Amer: 40 mL/min — ABNORMAL LOW (ref >60)
GFR, EST NON AFRICAN AMERICAN: 35 mL/min — AB (ref >60)
Glucose, Bld: 168 mg/dL — ABNORMAL HIGH (ref 65–99)
POTASSIUM: 3.8 mmol/L (ref 3.5–5.1)
Phosphorus: 3.8 mg/dL (ref 2.5–4.6)
Sodium: 139 mmol/L (ref 135–145)

## 2016-12-31 LAB — GLUCOSE, CAPILLARY
GLUCOSE-CAPILLARY: 150 mg/dL — AB (ref 65–99)
GLUCOSE-CAPILLARY: 158 mg/dL — AB (ref 65–99)
GLUCOSE-CAPILLARY: 169 mg/dL — AB (ref 65–99)
Glucose-Capillary: 180 mg/dL — ABNORMAL HIGH (ref 65–99)
Glucose-Capillary: 162 mg/dL — ABNORMAL HIGH (ref 65–99)
Glucose-Capillary: 167 mg/dL — ABNORMAL HIGH (ref 65–99)

## 2016-12-31 LAB — COMPREHENSIVE METABOLIC PANEL
ALBUMIN: 2.2 g/dL — AB (ref 3.5–5.0)
ALK PHOS: 55 U/L (ref 38–126)
ALT: 15 U/L — AB (ref 17–63)
AST: 29 U/L (ref 15–41)
Anion gap: 10 (ref 5–15)
BILIRUBIN TOTAL: 0.4 mg/dL (ref 0.3–1.2)
BUN: 56 mg/dL — AB (ref 6–20)
CO2: 25 mmol/L (ref 22–32)
CREATININE: 1.98 mg/dL — AB (ref 0.61–1.24)
Calcium: 8.3 mg/dL — ABNORMAL LOW (ref 8.9–10.3)
Chloride: 102 mmol/L (ref 101–111)
GFR calc Af Amer: 37 mL/min — ABNORMAL LOW (ref >60)
GFR, EST NON AFRICAN AMERICAN: 32 mL/min — AB (ref >60)
GLUCOSE: 178 mg/dL — AB (ref 65–99)
POTASSIUM: 3.7 mmol/L (ref 3.5–5.1)
Sodium: 137 mmol/L (ref 135–145)
TOTAL PROTEIN: 5.8 g/dL — AB (ref 6.5–8.1)

## 2016-12-31 LAB — PH, BODY FLUID: PH, BODY FLUID: 7.8

## 2016-12-31 LAB — PHOSPHORUS: Phosphorus: 3.4 mg/dL (ref 2.5–4.6)

## 2016-12-31 LAB — RHEUMATOID FACTORS, FLUID: RHEUMATOID ARTHRITIS, QN/FLUID: NEGATIVE

## 2016-12-31 LAB — APTT: aPTT: 36 seconds (ref 24–36)

## 2016-12-31 LAB — MAGNESIUM: MAGNESIUM: 2.8 mg/dL — AB (ref 1.7–2.4)

## 2016-12-31 MED ORDER — HALOPERIDOL LACTATE 5 MG/ML IJ SOLN
2.0000 mg | INTRAMUSCULAR | Status: DC | PRN
  Administered 2016-12-31 (×2): 2 mg via INTRAVENOUS
  Filled 2016-12-31 (×2): qty 1

## 2016-12-31 MED ORDER — FENTANYL CITRATE (PF) 100 MCG/2ML IJ SOLN
25.0000 ug | INTRAMUSCULAR | Status: DC | PRN
  Administered 2016-12-31: 50 ug via INTRAVENOUS
  Filled 2016-12-31: qty 2

## 2016-12-31 MED ORDER — FUROSEMIDE 10 MG/ML IJ SOLN
160.0000 mg | Freq: Two times a day (BID) | INTRAVENOUS | Status: AC
  Administered 2016-12-31 (×2): 160 mg via INTRAVENOUS
  Filled 2016-12-31 (×2): qty 16

## 2016-12-31 NOTE — Progress Notes (Signed)
194ml IV Fent wasted with Hurshel Party, RN.  Ruben Reason

## 2016-12-31 NOTE — Progress Notes (Signed)
Patient ID: Christian Garner, male   DOB: August 25, 1946, 71 y.o.   MRN: [id number] [facility] Progress Note   Assessment/ Plan:   1. Acute kidney injury: Suspected to be hemodynamically mediated from CHF exacerbation in the face of ongoing ACE inhibitor/diuretic therapy and likely compounded by Bactrim. Creatinine 2 1/2 weeks ago was 1.5.   CRRT 3/17-3/21 because of hemodynamic instability for the goal to treat hyperkalemia and to engage in ultrafiltration.  minus 10 liters.  with CVP 11- minimal O2 req and better hemodynamics- I took him off CRRT on 3/21-  UOP not better unfortunately /BP is- if he needs further HD he could maybe tolerate intermittent HD -  but given baseline creatinine 1.5 he could also maybe show some signs of recovery.  No indications for HD today- Will challenge with lasix and tentatively plan for IHD tomorrow at the bedside 2. Hyperkalemia:   resolved 3.  Respiratory failure: Suspected to be secondary to CHF exacerbation,  Parameters improving with UF and thoracentesis 4. Hematuria: Seen earlier by urology, serologies negative- is clearing some 5. Anemia- hgb falling slowly- watch   Subjective:   Has remained off pressors- 250 of UOP- underwent thoracentesis of 2 liters- ended up positive 1.3 liters but didn't count the 2 liters in chest   Objective:   BP (!) 111/55   Pulse 90   Temp 99.6 F (37.6 C) (Oral)   Resp 19   Ht _0  (1.88 m)   Wt 114.5 kg (252 lb 6.8 oz)   SpO2 96%   BMI 32.41 kg/m   Intake/Output Summary (Last 24 hours) at 12/31/16 0753 Last data filed at 12/31/16 0700  Gross per 24 hour  Intake          2132.15 ml  Output              818 ml  Net          1314.15 ml   Weight change: -3.3 kg (-7 lb 4.4 oz)  Physical Exam: [id number] on ventilator, right neck hematoma- left IJ vascath placed 3/17  CVS: Pulse regular rhythm, normal rate, normal S1 and S2 Resp: Coarse breath sounds bilaterally-no rales Abd: Soft, obese,  nontender Ext: Through-2+ lower extremity edema  Imaging: Dg Chest Port 1 View  Result Date: 12/31/2016 CLINICAL DATA:  Respiratory failure EXAM: PORTABLE CHEST 1 VIEW COMPARISON:  12/30/2016 FINDINGS: Endotracheal tube, nasogastric catheter and left jugular dialysis catheter are again noted and stable. Cardiac shadow is enlarged. The lungs are well aerated bilaterally. Some mild right basilar atelectasis is noted recurrent from the prior exam. Left lung remains clear. IMPRESSION: Mild right basilar atelectasis. Tubes and lines as described. Electronically Signed   By: Inez Catalina M.D.   On: 12/31/2016 07:18   Dg Chest Port 1 View  Result Date: 12/30/2016 CLINICAL DATA:  Post thoracentesis EXAM: PORTABLE CHEST 1 VIEW COMPARISON:  12/30/2016 FINDINGS: Cardiomegaly again noted. Significant improved aeration in right lung. The right pleural effusion has resolved. There is no pneumothorax. Stable endotracheal and NG tube position. No segmental infiltrate or pulmonary edema. Left IJ central line is unchanged in position. IMPRESSION: Significant improved aeration in right lung. The right pleural effusion has resolved. There is no pneumothorax. Stable endotracheal and NG tube position. No segmental infiltrate or pulmonary edema. Electronically Signed   By: Lahoma Crocker M.D.   On: 12/30/2016 18:17   Dg Chest Port 1 View  Result Date: 12/30/2016 CLINICAL DATA:  Ventilator EXAM: PORTABLE CHEST  1 VIEW COMPARISON:  12/29/2016 FINDINGS: Support devices are stable. Continued diffuse right lung airspace disease with moderate right pleural effusion. No confluent opacity on the left. Heart is borderline in size. IMPRESSION: Stable diffuse right lung airspace disease and moderate right effusion. Support devices stable. Electronically Signed   By: Rolm Baptise M.D.   On: 12/30/2016 08:43    Labs: BMET  Recent Labs Lab 12/28/16 0331 12/28/16 1036 12/28/16 1532 12/29/16 0355 12/29/16 1548 12/30/16 0400  12/30/16 1907 12/31/16 0224  NA 137  --  138 138 139 137 137 137  K 4.8  --  4.8 4.4 4.5 4.3 4.0 3.7  CL 102  --  100* 101 102 101 102 102  CO2 26  --  _0 GLUCOSE 105*  --  129* 141* 137* 152* 183* 178*  BUN 35*  --  30* 29* 26* 26* 45* 56*  CREATININE 2.64*  --  2.04* 1.63* 1.27* 1.29* 1.83* 1.98*  CALCIUM 8.3*  --  8.2* 8.2* 8.3* 8.3* 8.3* 8.3*  PHOS 4.2 3.9 3.8  3.7 2.4* 2.2*  2.5 1.8* 2.9 3.4   CBC  Recent Labs Lab 12/26/16 0716 12/27/16 0241 12/27/16 1351 12/28/16 0331 12/29/16 0355  WBC 15.5* 20.2*  --  19.5* 14.3*  NEUTROABS 12.5*  --   --   --   --   HGB 12.0* 12.0* 12.6* 11.6* 10.6*  HCT 39.4 39.0 37.0* 38.4* 35.3*  MCV 98.7 99.0  --  99.0 98.9  PLT 234 266  --  213 178   Medications:    . cefTRIAXone (ROCEPHIN)  IV  2 g Intravenous Q24H  . chlorhexidine gluconate (MEDLINE KIT)  15 mL Mouth Rinse BID  . Chlorhexidine Gluconate Cloth  6 each Topical Daily  . clopidogrel  75 mg Oral Daily  . feeding supplement (PRO-STAT SUGAR FREE 64)  60 mL Per Tube BID  . feeding supplement (VITAL HIGH PROTEIN)  1,000 mL Per Tube Q24H  . fentaNYL (SUBLIMAZE) injection  400 mcg Intravenous Once  . heparin subcutaneous  5,000 Units Subcutaneous Q8H  . heparin lock flush  1,000 Units Intravenous Once  . insulin aspart  2-6 Units Subcutaneous Q4H  . mouth rinse  15 mL Mouth Rinse QID  . pantoprazole (PROTONIX) IV  40 mg Intravenous Daily  . sodium chloride flush  10 mL Intravenous Q12H  . sodium chloride flush  3 mL Intravenous Q12H   Kaleo Condrey A  12/31/2016, 7:53 AM

## 2016-12-31 NOTE — Progress Notes (Signed)
SLP Cancellation Note  Patient Details Name: MARQUAVION VENHUIZEN MRN: [id number] DOB: 25-Dec-1945   Cancelled treatment:       Reason Eval/Treat Not Completed: Medical issues which prohibited therapy; not ready for swallow s/p extubation today.  Will f/u am.  D/W Rn.   [name] 12/31/2016, 3:14 PM

## 2016-12-31 NOTE — Progress Notes (Signed)
[place] Progress Note Patient Name: Christian Garner DOB: October 24, 1945 MRN: [id number]   Date of Service  12/31/2016  HPI/Events of Note  Agitation p ext, was on fentanyl drip  eICU Interventions  Fentanyl bolus with low dose haldol IV  prn     Intervention Category Major Interventions: Delirium, psychosis, severe agitation - evaluation and management  Christinia Gully 12/31/2016, 3:25 PM

## 2016-12-31 NOTE — Progress Notes (Signed)
Attempted SBT vent wean, pt desat to 82-84% even with increasing fio2.  Pt w/ low RR, low min volume, low VT.  Per RN, pt just received sedation.  Pt placed back on full vent support for now. RN aware.

## 2016-12-31 NOTE — Progress Notes (Signed)
PULMONARY / CRITICAL CARE MEDICINE   Name: Christian Garner MRN: [id number] DOB: [birth year]    ADMISSION DATE:  12/26/2016 CONSULTATION DATE:  12/26/16  REFERRING MD:  Tilda Burrow  CHIEF COMPLAINT:  Hypoxic respiratory failure, acute kidney failure  HISTORY OF PRESENT ILLNESS:   Christian Garner is a 71 y/o man with PMH of CHF with EF of ~30%, CAD with multiple stents, most recently 6/17 (DES), DM2 on metformin and januvia, LE edema and cellulitis currently on bactrim.  He was recently hospitalized at [facility] for CHF exacerbation and LE cellulitis, discharged on March 1st.  He was sent home on lasix, metolazone (weekly), bactrim, lisinopril, carvedilol and metformin.  He continued to have significant orthopnea after discharge and his family reports he has been unable to sleep because he has been unable to lay flat.  He went to the [name] ED on 12/25/16 with worsening shortness of breath and was found to have an SpO2 in the low to mid 80s on room air as well as a potassium of 8.5, Cr of 3.7 increased from 1.5 at discharge and a right sided pleural effusion.  His ABG was 7.15/54 and he was started on bipap and a bicarb gtt.  He was also given albuterol, calcium, insulin and glucose and kayexalate for his hyperkalemia.  His shortness of breath improved on BiPap and he was transferred to [facility] for further care.   SUBJECTIVE:  May be ready for extubation VITAL SIGNS: BP (!) 104/49   Pulse 72   Temp 99 F (37.2 C) (Axillary)   Resp (!) 22   Ht 6\' 2"  (1.88 m)   Wt 114.5 kg (252 lb 6.8 oz)   SpO2 95%   BMI 32.41 kg/m   HEMODYNAMICS:    VENTILATOR SETTINGS: Vent Mode: PRVC FiO2 (%):  [30 %] 30 % Set Rate:  [22 bmp] 22 bmp Vt Set:  [572 mL] 660 mL PEEP:  [5 cmH20] 5 cmH20 Plateau Pressure:  [12 cmH20-20 cmH20] 20 cmH20  INTAKE / OUTPUT: I/O last 3 completed shifts: In: 3123.9 [I.V.:733.9; NG/GT:2130; IV Piggyback:260] Out: 6203 [Urine:370;  TDHRC:1638]  PHYSICAL EXAMINATION: General:  Calm, sedated, supine in bed, CVVH  off Neuro:  Alert and agitated or sedated. Will attempt extubation today HEENT:  PERL, jvd wnl Cardiovascular:  s1 s2 RRR, II/VI systolic murmur, no gallop, no rub Lungs:  Reduced  Bases, mild rhonchi Abdomen:  Distended, soft, non-tender, hypo BS Musculoskeletal:  2+ LE pitting edema Skin:  LE erythematous from mid shin to ankles, scaly skin, healing ulcers.   LABS:  BMET  Recent Labs Lab 12/30/16 0400 12/30/16 1907 12/31/16 0224  NA 137 137 137  K 4.3 4.0 3.7  CL 101 102 102  CO2 27 25 25   BUN 26* 45* 56*  CREATININE 1.29* 1.83* 1.98*  GLUCOSE 152* 183* 178*    Electrolytes  Recent Labs Lab 12/29/16 1548 12/30/16 0400 12/30/16 1907 12/31/16 0224  CALCIUM 8.3* 8.3* 8.3* 8.3*  MG 2.7* 2.7*  --  2.8*  PHOS 2.2*  2.5 1.8* 2.9 3.4    CBC  Recent Labs Lab 12/27/16 0241 12/27/16 1351 12/28/16 0331 12/29/16 0355  WBC 20.2*  --  19.5* 14.3*  HGB 12.0* 12.6* 11.6* 10.6*  HCT 39.0 37.0* 38.4* 35.3*  PLT 266  --  213 178    Coag's  Recent Labs Lab 12/26/16 0716 12/29/16 0355 12/30/16 0400 12/31/16 0224  APTT  --  193* 176* 36  INR 1.24  --  1.19  --     Sepsis Markers  Recent Labs Lab 12/26/16 0716 12/26/16 1029  LATICACIDVEN 1.6 1.5  PROCALCITON 0.11  --     ABG  Recent Labs Lab 12/26/16 0355 12/26/16 1500 12/28/16 0455  PHART 7.265* 7.335* 7.311*  PCO2ART 51.2* 46.0 51.9*  PO2ART 113* 253* 66.0*   Liver Enzymes  Recent Labs Lab 12/26/16 0716  12/30/16 0400 12/30/16 1907 12/31/16 0224  AST 23  --   --   --  29  ALT 19  --   --   --  15*  ALKPHOS 63  --   --   --  55  BILITOT 0.4  --   --   --  0.4  ALBUMIN 3.5  < > 2.6* 2.3* 2.2*  < > = values in this interval not displayed.  Cardiac Enzymes  Recent Labs Lab 12/26/16 0716 12/26/16 1632 12/26/16 2230  TROPONINI 0.05* 0.06* 0.07*    Glucose  Recent Labs Lab 12/30/16 1238  12/30/16 1633 12/30/16 1912 12/30/16 2336 12/31/16 0343 12/31/16 0839  GLUCAP 187* 179* 161* 185* 158* 180*   Imaging Dg Chest Port 1 View  Result Date: 12/31/2016 CLINICAL DATA:  Respiratory failure EXAM: PORTABLE CHEST 1 VIEW COMPARISON:  12/30/2016 FINDINGS: Endotracheal tube, nasogastric catheter and left jugular dialysis catheter are again noted and stable. Cardiac shadow is enlarged. The lungs are well aerated bilaterally. Some mild right basilar atelectasis is noted recurrent from the prior exam. Left lung remains clear. IMPRESSION: Mild right basilar atelectasis. Tubes and lines as described. Electronically Signed   By: Inez Catalina M.D.   On: 12/31/2016 07:18   Dg Chest Port 1 View  Result Date: 12/30/2016 CLINICAL DATA:  Post thoracentesis EXAM: PORTABLE CHEST 1 VIEW COMPARISON:  12/30/2016 FINDINGS: Cardiomegaly again noted. Significant improved aeration in right lung. The right pleural effusion has resolved. There is no pneumothorax. Stable endotracheal and NG tube position. No segmental infiltrate or pulmonary edema. Left IJ central line is unchanged in position. IMPRESSION: Significant improved aeration in right lung. The right pleural effusion has resolved. There is no pneumothorax. Stable endotracheal and NG tube position. No segmental infiltrate or pulmonary edema. Electronically Signed   By: Lahoma Crocker M.D.   On: 12/30/2016 18:17   STUDIES:  CXR 12/26/16 - right sided pleural effusion CXR 12/28/16- underlying CHF, continued R pleural effusion,? Right infiltration Echo 3/20- 30-35%, mild mod MR  CULTURES: Urine, blood (at [place]) Urine 3/19>>neg Sputun 3/19>>neg 3/21 pleural >>NGTD>>  ANTIBIOTICS: Vanc 12/26/16 -->3/19 Cefepime 12/26/16 --->off 3/19 roc>>  SIGNIFICANT EVENTS: CRRT 3/17 3/21 left thora 2000 cc  LINES/TUBES: ETT 3/17>>> L IJ trialysis catheter 3/17>>> OGT 3/17>>>  DISCUSSION: 71 y/o presenting with hypoxic, hypercarbic respiratory failure, AKI,  hyperkalemia and possible sepsis  ASSESSMENT / PLAN:  PULMONARY A: Respiratory failure Right sided effusion P:   Weaning ps 5, cpap 5 goal again  Was successful neg 4.4 liters Effusion remains despite, post left thoracentesis 3/21 3/22 will try to extubate  CARDIOVASCULAR A:  CHF Circulatory shock(resolved) CHF acute sys 30-35%, mod MR P:  Continuous Tele monitoring Echo pending 3/20, required- reviewed He came off neo, not suprised with neg balance .  RENAL A:   AKI Hyperkalemia hematuria P:   cvvhd off Lasix per renal  HEMATOLOGIC A:   Chronic anemia Hematuria ptt prolonged on IV hep cvvhd P:  On iron supplements - hold while NPO Re add sub q hep ads cvvhd off  INFECTIOUS A:  UA with 20-30WBC and trace LE LE cellulitis Leukocytosis P:   Roc to stop date  GI A: Distended quiet abdomen OG to suction>> 100 cc bilious last 24 hours KUB 3/18>>  bowel gas pattern is unremarkable. P: Tolerating TF to goal  hold for ? extubation  ENDOCRINE CBG (last 3)   Recent Labs  12/30/16 2336 12/31/16 0343 12/31/16 0839  GLUCAP 185* 158* 180*     A:   DM2   P:   SSI per glycemic protocol CBGs Q 4 Controlled adequate  NEURO Dc versed drip need wua fent reduction   FAMILY  - Updates: No family bedside.  - Inter-disciplinary family meet or Palliative Care meeting due by:  01/02/17  App Ccm time 30 min   .Richardson Landry Minor ACNP Maryanna Shape PCCM Pager [phone] till 3 pm If no answer page [phone] 12/31/2016, 9:31 AM   STAFF NOTE: I, Merrie Roof, MD FACP have personally reviewed patient's available data, including medical history, events of note, physical examination and test results as part of my evaluation. I have discussed with resident/NP and other care providers such as pharmacist, RN and RRT. In addition, I personally evaluated patient and elicited key findings of: more awake, lungs MUCH improved to clear especially post thora 2 liters on  right, pcxr resolved effusion, now slight prominence hilum after poor urine output off cvvhd, agree we should push lasix to assess response and hope can make urine for balance issues, wean aggressive on cpap 5 ps5, assess mechanics at 30 min, dc all sedation with full WUA, from what I have back thus far is transudative from overload / arf, need to assess protein ratio, dc TF for weaning The patient is critically ill with multiple organ systems failure and requires high complexity decision making for assessment and support, frequent evaluation and titration of therapies, application of advanced monitoring technologies and extensive interpretation of multiple databases.   Critical Care Time devoted to patient care services described in this note is 30 Minutes. This time reflects time of care of this signee: Merrie Roof, MD FACP. This critical care time does not reflect procedure time, or teaching time or supervisory time of PA/NP/Med student/Med Resident etc but could involve care discussion time. Rest per NP/medical resident whose note is outlined above and that I agree with   Lavon Paganini. Titus Mould, MD, [place] Pgr: [place] [facility] 12/31/2016 9:57 AM

## 2016-12-31 NOTE — Procedures (Signed)
Extubation Procedure Note  Patient Details:   Name: Christian Garner DOB: 04-18-1946 MRN: [id number]   Airway Documentation:     Evaluation  O2 sats: stable throughout Complications: No apparent complications Patient did tolerate procedure well. Bilateral Breath Sounds: Clear, Diminished   Yes, pt able to speak.  No distress noted, no stridor noted.  RN and NP at bedside.   Lenna Sciara 12/31/2016, 9:59 AM

## 2016-12-31 NOTE — Progress Notes (Signed)
Pt agitated at this time; continues to try to get out of bed; pt pulling at lines; pt oriented to person only; bil mittens applied; MD paged to make aware; will cont. To monitor.  Christian Garner

## 2016-12-31 NOTE — Progress Notes (Signed)
New orders for PRN meds for agitation; will administer when available; will cont. To monitor.  Ruben Reason

## 2017-01-01 ENCOUNTER — Inpatient Hospital Stay (HOSPITAL_COMMUNITY): Payer: Medicare Other

## 2017-01-01 DIAGNOSIS — J9600 Acute respiratory failure, unspecified whether with hypoxia or hypercapnia: Secondary | ICD-10-CM

## 2017-01-01 LAB — BLOOD GAS, VENOUS
Acid-Base Excess: 6.2 mmol/L — ABNORMAL HIGH (ref 0.0–2.0)
BICARBONATE: 31 mmol/L — AB (ref 20.0–28.0)
DRAWN BY: 44135
O2 CONTENT: 4 L/min
O2 Saturation: 61.1 %
PATIENT TEMPERATURE: 98.6
PH VEN: 7.394 (ref 7.250–7.430)
pCO2, Ven: 51.8 mmHg (ref 44.0–60.0)
pO2, Ven: 35 mmHg (ref 32.0–45.0)

## 2017-01-01 LAB — GLUCOSE, CAPILLARY
GLUCOSE-CAPILLARY: 167 mg/dL — AB (ref 65–99)
GLUCOSE-CAPILLARY: 160 mg/dL — AB (ref 65–99)
GLUCOSE-CAPILLARY: 164 mg/dL — AB (ref 65–99)
Glucose-Capillary: 158 mg/dL — ABNORMAL HIGH (ref 65–99)
Glucose-Capillary: 179 mg/dL — ABNORMAL HIGH (ref 65–99)
Glucose-Capillary: 216 mg/dL — ABNORMAL HIGH (ref 65–99)

## 2017-01-01 LAB — RENAL FUNCTION PANEL
ANION GAP: 13 (ref 5–15)
Albumin: 2.8 g/dL — ABNORMAL LOW (ref 3.5–5.0)
BUN: 60 mg/dL — ABNORMAL HIGH (ref 6–20)
CHLORIDE: 97 mmol/L — AB (ref 101–111)
CO2: 30 mmol/L (ref 22–32)
Calcium: 9.3 mg/dL (ref 8.9–10.3)
Creatinine, Ser: 1.67 mg/dL — ABNORMAL HIGH (ref 0.61–1.24)
GFR calc non Af Amer: 40 mL/min — ABNORMAL LOW (ref >60)
GFR, EST AFRICAN AMERICAN: 46 mL/min — AB (ref >60)
Glucose, Bld: 159 mg/dL — ABNORMAL HIGH (ref 65–99)
POTASSIUM: 3.7 mmol/L (ref 3.5–5.1)
Phosphorus: 3.1 mg/dL (ref 2.5–4.6)
Sodium: 140 mmol/L (ref 135–145)

## 2017-01-01 LAB — BASIC METABOLIC PANEL
Anion gap: 11 (ref 5–15)
BUN: 64 mg/dL — ABNORMAL HIGH (ref 6–20)
CO2: 30 mmol/L (ref 22–32)
Calcium: 9.3 mg/dL (ref 8.9–10.3)
Chloride: 100 mmol/L — ABNORMAL LOW (ref 101–111)
Creatinine, Ser: 1.68 mg/dL — ABNORMAL HIGH (ref 0.61–1.24)
GFR, EST AFRICAN AMERICAN: 46 mL/min — AB (ref >60)
GFR, EST NON AFRICAN AMERICAN: 39 mL/min — AB (ref >60)
Glucose, Bld: 164 mg/dL — ABNORMAL HIGH (ref 65–99)
Potassium: 3.8 mmol/L (ref 3.5–5.1)
SODIUM: 141 mmol/L (ref 135–145)

## 2017-01-01 LAB — TROPONIN I: TROPONIN I: 0.06 ng/mL — AB (ref <0.03)

## 2017-01-01 LAB — CBC
HCT: 35.5 % — ABNORMAL LOW (ref 39.0–52.0)
Hemoglobin: 11.2 g/dL — ABNORMAL LOW (ref 13.0–17.0)
MCH: 29.9 pg (ref 26.0–34.0)
MCHC: 31.5 g/dL (ref 30.0–36.0)
MCV: 94.7 fL (ref 78.0–100.0)
Platelets: 201 10*3/uL (ref 150–400)
RBC: 3.75 MIL/uL — ABNORMAL LOW (ref 4.22–5.81)
RDW: 16.1 % — ABNORMAL HIGH (ref 11.5–15.5)
WBC: 10.6 10*3/uL — ABNORMAL HIGH (ref 4.0–10.5)

## 2017-01-01 LAB — MAGNESIUM
MAGNESIUM: 2.3 mg/dL (ref 1.7–2.4)
Magnesium: 2.5 mg/dL — ABNORMAL HIGH (ref 1.7–2.4)

## 2017-01-01 LAB — APTT: aPTT: 36 seconds (ref 24–36)

## 2017-01-01 MED ORDER — RESOURCE THICKENUP CLEAR PO POWD
Freq: Once | ORAL | Status: DC
  Filled 2017-01-01 (×2): qty 125

## 2017-01-01 MED ORDER — SODIUM CHLORIDE 0.9 % IV SOLN
30.0000 meq | Freq: Once | INTRAVENOUS | Status: AC
  Administered 2017-01-01: 30 meq via INTRAVENOUS
  Filled 2017-01-01 (×2): qty 15

## 2017-01-01 NOTE — Progress Notes (Signed)
[place] Progress Note Patient Name: Christian Garner DOB: 04/22/1946 MRN: [id number]   Date of Service  01/01/2017  HPI/Events of Note  Patient status post extubation. Nurse reports periods of hypersomnolence as well as wakening agitated. Patient has been transitioned to bedside chair. Camera check shows patient somewhat somnolent with eyes closed. Review of previous arterial blood gas on ventilator shows no evidence of retention. Body habitus would suggest the possibility of sleep apnea but nothing in the H&P to suggest this is been diagnosed. Currently on nothing for sedation. Last dose of Haldol was at 7 PM & fentanyl at 3:36 PM. Last dose of Versed was at 7 AM.   eICU Interventions  1. Checking venous blood gas 2. Close monitoring of respiratory and mental status 3. Discontinuing Versed and fentanyl      Intervention Category Major Interventions: Change in mental status - evaluation and management  Tera Partridge 01/01/2017, 2:12 AM

## 2017-01-01 NOTE — Progress Notes (Addendum)
PULMONARY / CRITICAL CARE MEDICINE   Name: Christian Garner MRN: [id number] DOB: 02/17/1946    ADMISSION DATE:  12/26/2016 CONSULTATION DATE:  12/26/16  REFERRING MD:  Tilda Burrow  CHIEF COMPLAINT:  Hypoxic respiratory failure, acute kidney failure  HISTORY OF PRESENT ILLNESS:   Christian Garner is a 71 y/o man with PMH of CHF with EF of ~30%, CAD with multiple stents, most recently 6/17 (DES), DM2 on metformin and januvia, LE edema and cellulitis currently on bactrim.  He was recently hospitalized at [facility] for CHF exacerbation and LE cellulitis, discharged on March 1st.  He was sent home on lasix, metolazone (weekly), bactrim, lisinopril, carvedilol and metformin.  He continued to have significant orthopnea after discharge and his family reports he has been unable to sleep because he has been unable to lay flat.  He went to the [facility] ED on 12/25/16 with worsening shortness of breath and was found to have an SpO2 in the low to mid 80s on room air as well as a potassium of 8.5, Cr of 3.7 increased from 1.5 at discharge and a right sided pleural effusion.  His ABG was 7.15/54 and he was started on bipap and a bicarb gtt.  He was also given albuterol, calcium, insulin and glucose and kayexalate for his hyperkalemia.  His shortness of breath improved on BiPap and he was transferred to [facility] for further care.   SUBJECTIVE:  Self resolved Vt run now Extubated, no distress Urine output increased, neg 3.2 liters   VITAL SIGNS: BP (!) 113/57 (BP Location: Right Arm)   Pulse 76   Temp 97.6 F (36.4 C) (Axillary)   Resp 19   Ht 6\' 2"  (1.88 m)   Wt 114.6 kg (252 lb 10.4 oz)   SpO2 99%   BMI 32.44 kg/m   HEMODYNAMICS:    VENTILATOR SETTINGS: Vent Mode: PSV;CPAP FiO2 (%):  [30 %] 30 % PEEP:  [5 cmH20] 5 cmH20 Pressure Support:  [5 cmH20] 5 cmH20  INTAKE / OUTPUT: I/O last 3 completed shifts: In: 2464.1 [P.O.:900; I.V.:510.7; NG/GT:937.4; IV  Piggyback:116] Out: 2595 [Urine:4875]  PHYSICAL EXAMINATION: General:  Calm, no distress Neuro:  Lethargic , awakens, fc HEENT:  PERL, jvd down Cardiovascular:  s1 s2 RRR, II/VI systolic murmur, no gallop, no rub Lungs: Improevd BS, no crackles Abdomen:  Distended mild reduced, soft, non-tender, hypo BS Musculoskeletal:  2+ LE pitting edema - no change Skin:  LE erythematous from mid shin to ankles mild resolving  LABS:  BMET  Recent Labs Lab 12/31/16 0224 12/31/16 1546 01/01/17 0232  NA 137 139 140  K 3.7 3.8 3.7  CL 102 100* 97*  CO2 25 27 30   BUN 56* 60* 60*  CREATININE 1.98* 1.86* 1.67*  GLUCOSE 178* 168* 159*    Electrolytes  Recent Labs Lab 12/30/16 0400  12/31/16 0224 12/31/16 1546 01/01/17 0220 01/01/17 0232  CALCIUM 8.3*  < > 8.3* 8.8*  --  9.3  MG 2.7*  --  2.8*  --  2.3  --   PHOS 1.8*  < > 3.4 3.8  --  3.1  < > = values in this interval not displayed.  CBC  Recent Labs Lab 12/28/16 0331 12/29/16 0355 01/01/17 0220  WBC 19.5* 14.3* 10.6*  HGB 11.6* 10.6* 11.2*  HCT 38.4* 35.3* 35.5*  PLT 213 178 201    Coag's  Recent Labs Lab 12/26/16 0716  12/30/16 0400 12/31/16 0224 01/01/17 0220  APTT  --   < >  176* 36 36  INR 1.24  --  1.19  --   --   < > = values in this interval not displayed.  Sepsis Markers  Recent Labs Lab 12/26/16 0716 12/26/16 1029  LATICACIDVEN 1.6 1.5  PROCALCITON 0.11  --     ABG  Recent Labs Lab 12/26/16 0355 12/26/16 1500 12/28/16 0455  PHART 7.265* 7.335* 7.311*  PCO2ART 51.2* 46.0 51.9*  PO2ART 113* 253* 66.0*   Liver Enzymes  Recent Labs Lab 12/26/16 0716  12/31/16 0224 12/31/16 1546 01/01/17 0232  AST 23  --  29  --   --   ALT 19  --  15*  --   --   ALKPHOS 63  --  55  --   --   BILITOT 0.4  --  0.4  --   --   ALBUMIN 3.5  < > 2.2* 2.6* 2.8*  < > = values in this interval not displayed.  Cardiac Enzymes  Recent Labs Lab 12/26/16 0716 12/26/16 1632 12/26/16 2230  TROPONINI  0.05* 0.06* 0.07*    Glucose  Recent Labs Lab 12/31/16 1235 12/31/16 1658 12/31/16 1920 12/31/16 2338 01/01/17 0341 01/01/17 0743  GLUCAP 162* 167* 169* 150* 167* 158*   Imaging Dg Chest Port 1 View  Result Date: 01/01/2017 CLINICAL DATA:  Extubated yesterday. EXAM: PORTABLE CHEST 1 VIEW COMPARISON:  Yesterday. FINDINGS: The endotracheal tube and nasogastric tube have been removed. Left jugular catheter tip in the superior vena cava. Stable enlarged cardiac silhouette and mild right basilar airspace opacity. Thoracic spine degenerative changes. IMPRESSION: Stable cardiomegaly and mild right basilar atelectasis. Electronically Signed   By: Claudie Revering M.D.   On: 01/01/2017 08:08   STUDIES:  CXR 12/26/16 - right sided pleural effusion CXR 12/28/16- underlying CHF, continued R pleural effusion,? Right infiltration Echo 3/20- 30-35%, mild mod MR  CULTURES: Urine, blood (at [place]) Urine 3/19>>neg Sputun 3/19>>neg 3/21 pleural >>NGTD>>  ANTIBIOTICS: Vanc 12/26/16 -->3/19 Cefepime 12/26/16 --->off 3/19 roc>>plan stop 24th  SIGNIFICANT EVENTS: CRRT 3/17 3/21 left thora 2000 cc 3/22 extubated  LINES/TUBES: ETT 3/17>>> L IJ trialysis catheter 3/17>>> OGT 3/17>>>  DISCUSSION: 71 y/o presenting with hypoxic, hypercarbic respiratory failure, AKI, hyperkalemia and possible sepsis  ASSESSMENT / PLAN:  PULMONARY A: Respiratory failure Right sided effusion P:   Neg balance goals remain , likely will  auto IS as able  CARDIOVASCULAR A:  CHF Circulatory shock(resolved) CHF acute sys 30-35%, mod MR Self resolving VT P:  Stat lytes again, trop, ecg Tele Dc haldol, QTC little over 500  RENAL A:   AKI Excellent urine output, mild hypokalemia P:   cvvhd off Lasix per renal k supp Stat lytes now again with VT  HEMATOLOGIC A:   Chronic anemia Hematuria ptt prolonged on IV hep cvvhd P:  Sub q hep  INFECTIOUS A:   UA with 20-30WBC and trace LE LE  cellulitis Leukocytosis P:   Roc to stop date today  GI A: Distended quiet abdomen OG to suction>> 100 cc bilious last 24 hours KUB 3/18>>  bowel gas pattern is unremarkable. P: SLP fail, re assess today  ENDOCRINE CBG (last 3)   Recent Labs  12/31/16 2338 01/01/17 0341 01/01/17 0743  GLUCAP 150* 167* 158*     A:   DM2   P:   SSI per glycemic protocol CBGs Q 4  NEURO Mild delrium Dc haldol with qtc May benefit seq when able to take oral   FAMILY  - Updates: No  family bedside.  - Inter-disciplinary family meet or Palliative Care meeting due by:  01/02/17   Lavon Paganini. Titus Mould, MD, [place] Pgr: [facility] 01/01/2017 9:00 AM

## 2017-01-01 NOTE — Progress Notes (Addendum)
Pt with 20 run of vtach on telemetry. MD paged, a/w callback, will continue to monitor.   MD at bedside.  Kathleen Argue S 8:42 AM

## 2017-01-01 NOTE — Procedures (Signed)
Objective Swallowing Evaluation: Type of Study: FEES-Fiberoptic Endoscopic Evaluation of Swallow  Patient Details  Name: Christian Garner MRN: [id number] Date of Birth: 1945/11/15  Today's Date: 01/01/2017 Time: SLP Start Time (ACUTE ONLY): 1315-SLP Stop Time (ACUTE ONLY): 1400 SLP Time Calculation (min) (ACUTE ONLY): 45 min  Past Medical History:  Past Medical History:  Diagnosis Date  . CAD (coronary artery disease)   . Cellulitis and abscess of lower extremity   . CHF (congestive heart failure) (HCC)    EF 30%  . DM (diabetes mellitus) ([place])    Past Surgical History:  Past Surgical History:  Procedure Laterality Date  . CORONARY ANGIOPLASTY WITH STENT PLACEMENT     HPI: 71 y/o presenting with hypoxic, hypercarbic respiratory failure, AKI, hyperkalemia and possible sepsis, ETT 3/17-3/22.  Subjective: intermittently awake   Assessment / Plan / Recommendation  CHL IP CLINICAL IMPRESSIONS 01/01/2017  Clinical Impression Pt presents with mild dysphagia s/p respiratory failure and five-day intubation.  Larynx is edematous throughout,  with bilateral ulcerations on false vocal folds and right posterior true vocal fold, leading to reduced laryngeal closure for airway protection.  This is most notable with thin liquids, which penetrate to the vocal folds and were likely aspirated, but subglottic edema prevented adequate visualization.  Purees, nectar-thick liquids, and soft solids were consumed with delays and mild/trace diffuse pharyngeal residue.  Recommend initiating a dysphagia 3 diet with nectar thick liquids; meds crushed in puree.  SLP will follow for safety/diet progression, education.   SLP Visit Diagnosis Dysphagia, pharyngeal phase (R13.13)  Attention and concentration deficit following --  Frontal lobe and executive function deficit following --  Impact on safety and function Mild aspiration risk      CHL IP TREATMENT RECOMMENDATION 01/01/2017  Treatment Recommendations  Therapy as outlined in treatment plan below     Prognosis 01/01/2017  Prognosis for Safe Diet Advancement Good  Barriers to Reach Goals --  Barriers/Prognosis Comment --    CHL IP DIET RECOMMENDATION 01/01/2017  SLP Diet Recommendations Dysphagia 3 (Mech soft) solids;Nectar thick liquid  Liquid Administration via Straw;Cup  Medication Administration Crushed with puree  Compensations Minimize environmental distractions;Slow rate;Small sips/bites  Postural Changes Seated upright at 90 degrees      CHL IP OTHER RECOMMENDATIONS 01/01/2017  Recommended Consults --  Oral Care Recommendations Oral care BID  Other Recommendations Order thickener from pharmacy      CHL IP FOLLOW UP RECOMMENDATIONS 01/01/2017  Follow up Recommendations (No Data)      CHL IP FREQUENCY AND DURATION 01/01/2017  Speech Therapy Frequency (ACUTE ONLY) min 2x/week  Treatment Duration 2 weeks           CHL IP ORAL PHASE 01/01/2017  Oral Phase WFL  Oral - Pudding Teaspoon --  Oral - Pudding Cup --  Oral - Honey Teaspoon --  Oral - Honey Cup --  Oral - Nectar Teaspoon --  Oral - Nectar Cup --  Oral - Nectar Straw --  Oral - Thin Teaspoon --  Oral - Thin Cup --  Oral - Thin Straw --  Oral - Puree --  Oral - Mech Soft --  Oral - Regular --  Oral - Multi-Consistency --  Oral - Pill --  Oral Phase - Comment --    CHL IP PHARYNGEAL PHASE 01/01/2017  Pharyngeal Phase Impaired  Pharyngeal- Pudding Teaspoon --  Pharyngeal --  Pharyngeal- Pudding Cup --  Pharyngeal --  Pharyngeal- Honey Teaspoon --  Pharyngeal --  Pharyngeal- Honey Cup --  Pharyngeal --  Pharyngeal- Nectar Teaspoon --  Pharyngeal --  Pharyngeal- Nectar Cup Delayed swallow initiation-pyriform sinuses;Reduced airway/laryngeal closure;Pharyngeal residue - valleculae;Pharyngeal residue - pyriform  Pharyngeal Material does not enter airway  Pharyngeal- Nectar Straw --  Pharyngeal --  Pharyngeal- Thin Teaspoon --  Pharyngeal --   Pharyngeal- Thin Cup Delayed swallow initiation-pyriform sinuses;Reduced airway/laryngeal closure;Penetration/Apiration after swallow;Penetration/Aspiration before swallow;Penetration/Aspiration during swallow;Pharyngeal residue - valleculae;Pharyngeal residue - pyriform  Pharyngeal Material enters airway, CONTACTS cords and not ejected out  Pharyngeal- Thin Straw --  Pharyngeal --  Pharyngeal- Puree Delayed swallow initiation-vallecula;Pharyngeal residue - valleculae;Pharyngeal residue - pyriform  Pharyngeal --  Pharyngeal- Mechanical Soft --  Pharyngeal --  Pharyngeal- Regular Delayed swallow initiation-vallecula;Pharyngeal residue - valleculae;Pharyngeal residue - pyriform  Pharyngeal --  Pharyngeal- Multi-consistency --  Pharyngeal --  Pharyngeal- Pill --  Pharyngeal --  Pharyngeal Comment --     No flowsheet data found.  No flowsheet data found.  [name] 01/01/2017, 4:41 PM

## 2017-01-01 NOTE — Progress Notes (Signed)
CRITICAL VALUE ALERT  Critical value received: troponin 0.06  Date of notification:  01/01/17  Time of notification:  0925  Critical value read back:yes  Nurse who received alert:  Sherrie Mustache   MD notified (1st page):  Dr. Titus Mould  Time of first page:  0930  MD notified (2nd page):  Time of second page:  Responding MD:  Dr. Titus Mould  Time MD responded:  [place]

## 2017-01-01 NOTE — Progress Notes (Signed)
Nutrition Follow Up  DOCUMENTATION CODES:   Obesity unspecified  INTERVENTION:    If TF warranted, recommend:  Vital AF 1.2 at goal rate of 75 ml/h (1800 ml per day) to provide 2160 kcals, 135 gm protein, 1460 ml free water daily  NUTRITION DIAGNOSIS:   Inadequate oral intake related to inability to eat as evidenced by NPO status, ongoing  GOAL:   Patient will meet greater than or equal to 90% of their needs, currently unmet  MONITOR:   Diet advancement, PO intake, Labs, Weight trends, Skin, I & O's  ASSESSMENT:   71 yo Male with PMH significant for CHF with EF of ~30%, CAD, DM2, LE edema and cellulitis; went to the [facility] ED on 12/25/16 with worsening shortness of breath and was found to have an SpO2 in the low to mid 80s on room air as well as a potassium of 8.5, Cr of 3.7 increased from 1.5 at discharge and a right sided pleural effusion; transferred to [facility] for further care.    Pt transferred from [facility] for hypoxic respiratory failure and acute renal failure.  S/p thoracentesis 3/21. Extubated 3/22.  TF (Vital HP formula) discontinued via OGT. S/p bedside swallow evaluation per SLP.  FEES pending. Labs reviewed.  Magnesium 2.5 (H). CRRT stopped 3/21. CBG's [id number].  Diet Order:  Diet NPO time specified  Skin:  Reviewed, no issues  Last BM:  3/22  Height:   Ht Readings from Last 1 Encounters:  12/31/16 6\' 2"  (1.88 m)   Weight:   Wt Readings from Last 1 Encounters:  01/01/17 252 lb 10.4 oz (114.6 kg)   Ideal Body Weight:  86.3 kg  BMI:  Body mass index is 32.44 kg/m.  Re-estimated Nutritional Needs:   Kcal:  2000-2200  Protein:  125-135 gm  Fluid:  per MD  EDUCATION NEEDS:   No education needs identified at this time  Arthur Holms, RD, LDN Pager #: [phone] After-Hours Pager #: [phone]

## 2017-01-01 NOTE — Evaluation (Signed)
Clinical/Bedside Swallow Evaluation Patient Details  Name: Christian Garner MRN: [id number] Date of Birth: [birth year]  Today's Date: 01/01/2017 Time: SLP Start Time (ACUTE ONLY): 0910 SLP Stop Time (ACUTE ONLY): 0923 SLP Time Calculation (min) (ACUTE ONLY): 13 min  Past Medical History:  Past Medical History:  Diagnosis Date  . CAD (coronary artery disease)   . Cellulitis and abscess of lower extremity   . CHF (congestive heart failure) (HCC)    EF 30%  . DM (diabetes mellitus) ([place])    Past Surgical History:  Past Surgical History:  Procedure Laterality Date  . CORONARY ANGIOPLASTY WITH STENT PLACEMENT     HPI:  71 y/o presenting with hypoxic, hypercarbic respiratory failure, AKI, hyperkalemia and possible sepsis, ETT 3/17-3/22.   Assessment / Plan / Recommendation Clinical Impression  Pt presents with + s/s of dysphagia s/p resp failure and prolonged intubation.  No focal CN deficits; + oral hygiene;  + awareness of P0s, disoriented.  Pt failed three ounce water test with explosive coughing post swallow.  Recommend proceeding with FEES to determine safest PO diet and nature of dysphagia.  Scheduled for 1300 today.  SLP Visit Diagnosis: Dysphagia, unspecified (R13.10)    Aspiration Risk       Diet Recommendation   NPO; FEES this afternoon       Other  Recommendations Oral Care Recommendations: Oral care BID   Follow up Recommendations        Frequency and Duration            Prognosis Prognosis for Safe Diet Advancement: Good      Swallow Study   General Date of Onset: 12/26/16 HPI: 71 y/o presenting with hypoxic, hypercarbic respiratory failure, AKI, hyperkalemia and possible sepsis, ETT 3/17-3/22. Type of Study: Bedside Swallow Evaluation Previous Swallow Assessment: no Diet Prior to this Study: NPO Temperature Spikes Noted: No Respiratory Status: Nasal cannula History of Recent Intubation: Yes Length of Intubations (days): 5 days Date extubated:  12/31/16 Behavior/Cognition: Cooperative;Lethargic/Drowsy Oral Cavity Assessment: Within Functional Limits Oral Care Completed by SLP: Recent completion by staff Oral Cavity - Dentition: Adequate natural dentition Vision: Functional for self-feeding Self-Feeding Abilities: Able to feed self;Needs assist Patient Positioning: Upright in bed Baseline Vocal Quality: Hoarse Volitional Cough: Strong Volitional Swallow: Able to elicit    Oral/Motor/Sensory Function Overall Oral Motor/Sensory Function: Within functional limits   Ice Chips Ice chips: Not tested   Thin Liquid Thin Liquid: Impaired Presentation: Cup;Self Fed Pharyngeal  Phase Impairments: Cough - Immediate    Nectar Thick Nectar Thick Liquid: Not tested   Honey Thick Honey Thick Liquid: Not tested   Puree Puree: Within functional limits Presentation: Spoon   Solid   GO   Solid: Not tested        Christian Garner 01/01/2017,9:34 AM  Christian Garner, [place] CCC/SLP Pager [phone]

## 2017-01-01 NOTE — Progress Notes (Signed)
Patient ID: Christian Garner, male   DOB: 05/31/1946, 71 y.o.   MRN: [id number] [facility] Progress Note   Assessment/ Plan:   1. Acute kidney injury: Suspected to be hemodynamically mediated from CHF exacerbation in the face of ongoing ACE inhibitor/diuretic therapy and likely compounded by Bactrim. Creatinine 2 1/2 weeks ago was 1.5.   CRRT 3/17-3/21 - stopped b/c BP was better, felt  if he needed further HD he could maybe tolerate intermittent HD -  but given baseline creatinine 1.5 he could also maybe show some signs of recovery- which he has !!  No indications for HD today and may not need again.  I will actually hold lasix and see what he does today  2. Hyperkalemia:   Resolved- giving some repletion  today due to runs of VT which is fine  3.  Respiratory failure: Suspected to be secondary to CHF exacerbation,  Parameters improving with UF and thoracentesis- now extubated 4. Hematuria: Seen earlier by urology, serologies negative- is clearing some 5. Anemia- hgb falling slowly- watch   Subjective:   Extubated- made 4.7 liters of urine!!! Creatinine down    Objective:   BP (!) 113/57 (BP Location: Right Arm)   Pulse 76   Temp 97.6 F (36.4 C) (Axillary)   Resp 19   Ht _0  (1.88 m)   Wt 114.6 kg (252 lb 10.4 oz)   SpO2 99%   BMI 32.44 kg/m   Intake/Output Summary (Last 24 hours) at 01/01/17 5465 Last data filed at 01/01/17 0800  Gross per 24 hour  Intake          1199.17 ml  Output             4825 ml  Net         -3625.83 ml   Weight change: 0.1 kg (3.5 oz)  Physical Exam: KPT:[id number] on ventilator, right neck hematoma- left IJ vascath placed 3/17  CVS: Pulse regular rhythm, normal rate, normal S1 and S2 Resp: Coarse breath sounds bilaterally-no rales Abd: Soft, obese, nontender Ext: Through-2+ lower extremity edema  Imaging: Dg Chest Port 1 View  Result Date: 01/01/2017 CLINICAL DATA:  Extubated yesterday. EXAM: PORTABLE CHEST 1 VIEW COMPARISON:   Yesterday. FINDINGS: The endotracheal tube and nasogastric tube have been removed. Left jugular catheter tip in the superior vena cava. Stable enlarged cardiac silhouette and mild right basilar airspace opacity. Thoracic spine degenerative changes. IMPRESSION: Stable cardiomegaly and mild right basilar atelectasis. Electronically Signed   By: Claudie Revering M.D.   On: 01/01/2017 08:08   Dg Chest Port 1 View  Result Date: 12/31/2016 CLINICAL DATA:  Respiratory failure EXAM: PORTABLE CHEST 1 VIEW COMPARISON:  12/30/2016 FINDINGS: Endotracheal tube, nasogastric catheter and left jugular dialysis catheter are again noted and stable. Cardiac shadow is enlarged. The lungs are well aerated bilaterally. Some mild right basilar atelectasis is noted recurrent from the prior exam. Left lung remains clear. IMPRESSION: Mild right basilar atelectasis. Tubes and lines as described. Electronically Signed   By: Inez Catalina M.D.   On: 12/31/2016 07:18   Dg Chest Port 1 View  Result Date: 12/30/2016 CLINICAL DATA:  Post thoracentesis EXAM: PORTABLE CHEST 1 VIEW COMPARISON:  12/30/2016 FINDINGS: Cardiomegaly again noted. Significant improved aeration in right lung. The right pleural effusion has resolved. There is no pneumothorax. Stable endotracheal and NG tube position. No segmental infiltrate or pulmonary edema. Left IJ central line is unchanged in position. IMPRESSION: Significant improved aeration in right lung. The  right pleural effusion has resolved. There is no pneumothorax. Stable endotracheal and NG tube position. No segmental infiltrate or pulmonary edema. Electronically Signed   By: Lahoma Crocker M.D.   On: 12/30/2016 18:17    Labs: BMET  Recent Labs Lab 12/29/16 0355 12/29/16 1548 12/30/16 0400 12/30/16 1907 12/31/16 0224 12/31/16 1546 01/01/17 0232  NA 138 139 137 137 137 139 140  K 4.4 4.5 4.3 4.0 3.7 3.8 3.7  CL 101 102 101 102 102 100* 97*  CO2 _0 GLUCOSE 141* 137* 152* 183*  178* 168* 159*  BUN 29* 26* 26* 45* 56* 60* 60*  CREATININE 1.63* 1.27* 1.29* 1.83* 1.98* 1.86* 1.67*  CALCIUM 8.2* 8.3* 8.3* 8.3* 8.3* 8.8* 9.3  PHOS 2.4* 2.2*  2.5 1.8* 2.9 3.4 3.8 3.1   CBC  Recent Labs Lab 12/26/16 0716 12/27/16 0241 12/27/16 1351 12/28/16 0331 12/29/16 0355 01/01/17 0220  WBC 15.5* 20.2*  --  19.5* 14.3* 10.6*  NEUTROABS 12.5*  --   --   --   --   --   HGB 12.0* 12.0* 12.6* 11.6* 10.6* 11.2*  HCT 39.4 39.0 37.0* 38.4* 35.3* 35.5*  MCV 98.7 99.0  --  99.0 98.9 94.7  PLT 234 266  --  213 178 201   Medications:    . cefTRIAXone (ROCEPHIN)  IV  2 g Intravenous Q24H  . chlorhexidine gluconate (MEDLINE KIT)  15 mL Mouth Rinse BID  . Chlorhexidine Gluconate Cloth  6 each Topical Daily  . clopidogrel  75 mg Oral Daily  . heparin subcutaneous  5,000 Units Subcutaneous Q8H  . heparin lock flush  1,000 Units Intravenous Once  . insulin aspart  2-6 Units Subcutaneous Q4H  . mouth rinse  15 mL Mouth Rinse QID  . pantoprazole (PROTONIX) IV  40 mg Intravenous Daily  . sodium chloride flush  10 mL Intravenous Q12H  . sodium chloride flush  3 mL Intravenous Q12H   Jacobs Golab A  01/01/2017, 9:04 AM

## 2017-01-02 ENCOUNTER — Inpatient Hospital Stay (HOSPITAL_COMMUNITY): Payer: Medicare Other

## 2017-01-02 LAB — RENAL FUNCTION PANEL
ALBUMIN: 2.4 g/dL — AB (ref 3.5–5.0)
ANION GAP: 11 (ref 5–15)
BUN: 62 mg/dL — AB (ref 6–20)
CO2: 30 mmol/L (ref 22–32)
Calcium: 9.2 mg/dL (ref 8.9–10.3)
Chloride: 102 mmol/L (ref 101–111)
Creatinine, Ser: 1.41 mg/dL — ABNORMAL HIGH (ref 0.61–1.24)
GFR calc non Af Amer: 49 mL/min — ABNORMAL LOW (ref >60)
GFR, EST AFRICAN AMERICAN: 56 mL/min — AB (ref >60)
Glucose, Bld: 142 mg/dL — ABNORMAL HIGH (ref 65–99)
PHOSPHORUS: 4.4 mg/dL (ref 2.5–4.6)
POTASSIUM: 4.5 mmol/L (ref 3.5–5.1)
Sodium: 143 mmol/L (ref 135–145)

## 2017-01-02 LAB — CBC WITH DIFFERENTIAL/PLATELET
Basophils Absolute: 0 10*3/uL (ref 0.0–0.1)
Basophils Relative: 0 %
EOS PCT: 2 %
Eosinophils Absolute: 0.2 10*3/uL (ref 0.0–0.7)
HCT: 35.9 % — ABNORMAL LOW (ref 39.0–52.0)
Hemoglobin: 10.9 g/dL — ABNORMAL LOW (ref 13.0–17.0)
Lymphocytes Relative: 12 %
Lymphs Abs: 1.3 10*3/uL (ref 0.7–4.0)
MCH: 29.5 pg (ref 26.0–34.0)
MCHC: 30.4 g/dL (ref 30.0–36.0)
MCV: 97 fL (ref 78.0–100.0)
MONO ABS: 0.9 10*3/uL (ref 0.1–1.0)
MONOS PCT: 9 %
NEUTROS PCT: 77 %
Neutro Abs: 8.2 10*3/uL — ABNORMAL HIGH (ref 1.7–7.7)
PLATELETS: 212 10*3/uL (ref 150–400)
RBC: 3.7 MIL/uL — AB (ref 4.22–5.81)
RDW: 16.2 % — ABNORMAL HIGH (ref 11.5–15.5)
WBC: 10.6 10*3/uL — ABNORMAL HIGH (ref 4.0–10.5)

## 2017-01-02 LAB — GLUCOSE, CAPILLARY
GLUCOSE-CAPILLARY: 226 mg/dL — AB (ref 65–99)
GLUCOSE-CAPILLARY: 186 mg/dL — AB (ref 65–99)
Glucose-Capillary: 150 mg/dL — ABNORMAL HIGH (ref 65–99)
Glucose-Capillary: 178 mg/dL — ABNORMAL HIGH (ref 65–99)
Glucose-Capillary: 150 mg/dL — ABNORMAL HIGH (ref 65–99)

## 2017-01-02 LAB — BASIC METABOLIC PANEL
Anion gap: 11 (ref 5–15)
BUN: 61 mg/dL — ABNORMAL HIGH (ref 6–20)
CALCIUM: 9.2 mg/dL (ref 8.9–10.3)
CO2: 31 mmol/L (ref 22–32)
CREATININE: 1.44 mg/dL — AB (ref 0.61–1.24)
Chloride: 101 mmol/L (ref 101–111)
GFR calc non Af Amer: 47 mL/min — ABNORMAL LOW (ref >60)
GFR, EST AFRICAN AMERICAN: 55 mL/min — AB (ref >60)
Glucose, Bld: 141 mg/dL — ABNORMAL HIGH (ref 65–99)
Potassium: 4.5 mmol/L (ref 3.5–5.1)
Sodium: 143 mmol/L (ref 135–145)

## 2017-01-02 LAB — MAGNESIUM: MAGNESIUM: 2.6 mg/dL — AB (ref 1.7–2.4)

## 2017-01-02 LAB — PHOSPHORUS: Phosphorus: 4.5 mg/dL (ref 2.5–4.6)

## 2017-01-02 LAB — APTT: APTT: 35 s (ref 24–36)

## 2017-01-02 MED ORDER — PANTOPRAZOLE SODIUM 40 MG PO PACK
40.0000 mg | PACK | Freq: Every day | ORAL | Status: DC
  Administered 2017-01-02 – 2017-01-04 (×3): 40 mg via ORAL
  Filled 2017-01-02 (×2): qty 20

## 2017-01-02 MED ORDER — INSULIN ASPART 100 UNIT/ML ~~LOC~~ SOLN
0.0000 [IU] | Freq: Three times a day (TID) | SUBCUTANEOUS | Status: DC
  Administered 2017-01-02: 3 [IU] via SUBCUTANEOUS
  Administered 2017-01-02 – 2017-01-03 (×2): 1 [IU] via SUBCUTANEOUS
  Administered 2017-01-03 – 2017-01-04 (×3): 2 [IU] via SUBCUTANEOUS
  Administered 2017-01-04: 3 [IU] via SUBCUTANEOUS
  Administered 2017-01-04 – 2017-01-05 (×2): 2 [IU] via SUBCUTANEOUS

## 2017-01-02 MED ORDER — ORAL CARE MOUTH RINSE
15.0000 mL | Freq: Two times a day (BID) | OROMUCOSAL | Status: DC
  Administered 2017-01-02 – 2017-01-05 (×6): 15 mL via OROMUCOSAL

## 2017-01-02 MED ORDER — HYDROCERIN EX CREA
TOPICAL_CREAM | Freq: Two times a day (BID) | CUTANEOUS | Status: DC
  Administered 2017-01-02 – 2017-01-03 (×2): via TOPICAL
  Administered 2017-01-03: 1 via TOPICAL
  Administered 2017-01-04 – 2017-01-05 (×3): via TOPICAL
  Filled 2017-01-02: qty 113

## 2017-01-02 NOTE — Progress Notes (Signed)
Patient ID: Christian Garner, male   DOB: 1946/03/01, 71 y.o.   MRN: [id number] [facility] Progress Note   Assessment/ Plan:   1. Acute kidney injury: Suspected to be hemodynamically mediated from CHF exacerbation in the face of ongoing ACE inhibitor/diuretic therapy and likely compounded by Bactrim. Creatinine 3 weeks ago was 1.5.   CRRT 3/17-3/21 - stopped b/c BP was better, felt  if he needed further HD he could maybe tolerate intermittent HD -  but given baseline creatinine 1.5 he could also maybe show some signs of recovery- which he has !!  No indications for HD today and may not need again.  Made 1100 of urine off of lasix and creatinine down  2. Hyperkalemia:   Resolved- giving some repletion  today due to runs of VT which is fine  3.  Respiratory failure: Suspected to be secondary to CHF exacerbation,  Parameters improving with UF and thoracentesis- now extubated 4. Hematuria: Seen earlier by urology, serologies negative- is clearing some 5. Anemia- hgb falling slowly- watch   Subjective:     Creatinine down again- making reasonable urine   Objective:   BP 122/69   Pulse 93   Temp 99.8 F (37.7 C) (Axillary)   Resp (!) 26   Ht 6\' 2"  (1.88 m)   Wt 113.7 kg (250 lb 10.6 oz)   SpO2 98%   BMI 32.18 kg/m   Intake/Output Summary (Last 24 hours) at 01/02/17 0725 Last data filed at 01/02/17 0600  Gross per 24 hour  Intake              735 ml  Output             1175 ml  Net             -440 ml   Weight change: -0.9 kg (-1 lb 15.7 oz)  Physical Exam: EYC:[id number] extubated, right neck hematoma- left IJ vascath placed 3/17 asking for water CVS: Pulse regular rhythm, normal rate, normal S1 and S2 Resp: Coarse breath sounds bilaterally-no rales Abd: Soft, obese, nontender Ext: mild lower extremity edema- but better  Imaging: Dg Chest Port 1 View  Result Date: 01/01/2017 CLINICAL DATA:  Extubated yesterday. EXAM: PORTABLE CHEST 1 VIEW COMPARISON:  Yesterday.  FINDINGS: The endotracheal tube and nasogastric tube have been removed. Left jugular catheter tip in the superior vena cava. Stable enlarged cardiac silhouette and mild right basilar airspace opacity. Thoracic spine degenerative changes. IMPRESSION: Stable cardiomegaly and mild right basilar atelectasis. Electronically Signed   By: Claudie Revering M.D.   On: 01/01/2017 08:08    Labs: BMET  Recent Labs Lab 12/29/16 1548 12/30/16 0400 12/30/16 1907 12/31/16 0224 12/31/16 1546 01/01/17 0232 01/01/17 0854 01/02/17 0320  NA 139 137 137 137 139 140 141 143  143  K 4.5 4.3 4.0 3.7 3.8 3.7 3.8 4.5  4.5  CL 102 101 102 102 100* 97* 100* 101  102  CO2 28 27 25 25 27 30 30 31  30   GLUCOSE 137* 152* 183* 178* 168* 159* 164* 141*  142*  BUN 26* 26* 45* 56* 60* 60* 64* 61*  62*  CREATININE 1.27* 1.29* 1.83* 1.98* 1.86* 1.67* 1.68* 1.44*  1.41*  CALCIUM 8.3* 8.3* 8.3* 8.3* 8.8* 9.3 9.3 9.2  9.2  PHOS 2.2*  2.5 1.8* 2.9 3.4 3.8 3.1  --  4.5  4.4   CBC  Recent Labs Lab 12/28/16 0331 12/29/16 0355 01/01/17 0220 01/02/17 0320  WBC 19.5*  14.3* 10.6* 10.6*  NEUTROABS  --   --   --  8.2*  HGB 11.6* 10.6* 11.2* 10.9*  HCT 38.4* 35.3* 35.5* 35.9*  MCV 99.0 98.9 94.7 97.0  PLT 213 178 201 212   Medications:    . cefTRIAXone (ROCEPHIN)  IV  2 g Intravenous Q24H  . Chlorhexidine Gluconate Cloth  6 each Topical Daily  . clopidogrel  75 mg Oral Daily  . heparin subcutaneous  5,000 Units Subcutaneous Q8H  . heparin lock flush  1,000 Units Intravenous Once  . insulin aspart  2-6 Units Subcutaneous Q4H  . mouth rinse  15 mL Mouth Rinse BID  . pantoprazole (PROTONIX) IV  40 mg Intravenous Daily  . RESOURCE THICKENUP CLEAR   Oral Once  . sodium chloride flush  10 mL Intravenous Q12H  . sodium chloride flush  3 mL Intravenous Q12H   Sameul Tagle A  01/02/2017, 7:25 AM

## 2017-01-02 NOTE — Progress Notes (Signed)
PROGRESS NOTE    EMERALD GEHRES  GGY:[id number] DOB: 19-Jul-1946 DOA: 12/26/2016 PCP: [name], MD     Brief Narrative:  Christian Garner is a 71 y/o man with PMH of CHF with EF of ~30%, CAD with multiple stents, most recently 6/17 (DES), DM2 on metformin and januvia, LE edema and cellulitis currently on bactrim.  He was recently hospitalized at [name] for CHF exacerbation and LE cellulitis, discharged on March 1st.  He was sent home on lasix, metolazone (weekly), bactrim, lisinopril, carvedilol and metformin.  He continued to have significant orthopnea after discharge and his family reports he has been unable to sleep because he has been unable to lay flat.  He went to the [facility] ED on 12/25/16 with worsening shortness of breath and was found to have an SpO2 in the low to mid 80s on room air as well as a potassium of 8.5, Cr of 3.7 increased from 1.5 at discharge and a right sided pleural effusion. His ABG was 7.15/54 and he was started on bipap and a bicarb gtt.  He was also given albuterol, calcium, insulin and glucose and kayexalate for his hyperkalemia.  His shortness of breath improved on BiPap and he was transferred to [facility] for further care under PCCM service.   SIGNIFICANT EVENTS: 3/17 Bipap, start CRRT, intubated, central line 3/21 left thora 2000 cc 3/22 Extubated 3/24 Transferred to [facility]   Assessment & Plan:   Active Problems:   Respiratory failure (HCC)   AKI (acute kidney injury) ([place])   Endotracheal tube present   Pleural effusion   Acute hypoxemic respiratory failure -Due to volume overload, CHF -Now extubated, continue to wean nasal cannula O2 as able -S/p left thoracentesis 3/22, fluid culture pending   Acute kidney injury -Suspected to be hemodynamically mediated from CHF exacerbation, ACE inhibitor, diuretic therapy, Bactrim use -CRRT 3/17-3/21 -Nephrology following -Cr today 1.4  Acute on chronic systolic and  diastolic heart failure  -Echo EF 03-50%, grade 2 diastolic dysfunction -Has diuresed well with Lasix, improved with CRRT  Lower extremity cellulitis -Empiric Vanco and cefepime, then Rocephin, now stopped  CAD -Continue plavix   Diabetes type 2 -SSI  Hematuria -Urology consulted 3/17, likely related to prosthetic bleeding. Flush Foley when necessary for clots. No further recommendations.   DVT prophylaxis: subq hep Code Status: full Family Communication: no family at bedside Disposition Plan: improving, PT OT to eval   Consultants:   PCCM  Nephrology  Antimicrobials:  Anti-infectives    Start     Dose/Rate Route Frequency Ordered Stop   12/30/16 1000  cefTRIAXone (ROCEPHIN) 2 g in dextrose 5 % 50 mL IVPB  Status:  Discontinued     2 g 100 mL/hr over 30 Minutes Intravenous Every 24 hours 12/29/16 0955 01/02/17 1122   12/29/16 1030  cefTRIAXone (ROCEPHIN) 2 g in dextrose 5 % 50 mL IVPB  Status:  Discontinued     2 g 100 mL/hr over 30 Minutes Intravenous NOW 12/29/16 0955 12/29/16 1009   12/29/16 1030  cefTRIAXone (ROCEPHIN) 1 g in dextrose 5 % 50 mL IVPB     1 g 100 mL/hr over 30 Minutes Intravenous NOW 12/29/16 1009 12/29/16 1108   12/29/16 1000  cefTRIAXone (ROCEPHIN) 2 g in dextrose 5 % 50 mL IVPB  Status:  Discontinued     2 g 100 mL/hr over 30 Minutes Intravenous Every 24 hours 12/29/16 0926 12/29/16 0955   12/29/16 0930  cefTRIAXone (ROCEPHIN) 1  g in dextrose 5 % 50 mL IVPB  Status:  Discontinued     1 g 100 mL/hr over 30 Minutes Intravenous Every 24 hours 12/29/16 0841 12/29/16 0926   12/27/16 0700  vancomycin (VANCOCIN) 1,250 mg in sodium chloride 0.9 % 250 mL IVPB  Status:  Discontinued     1,250 mg 166.7 mL/hr over 90 Minutes Intravenous Every 24 hours 12/26/16 1137 12/28/16 1028   12/27/16 0600  ceFEPIme (MAXIPIME) 1 g in dextrose 5 % 50 mL IVPB  Status:  Discontinued     1 g 100 mL/hr over 30 Minutes Intravenous Every 24 hours 12/26/16 0638 12/26/16 1137    12/26/16 1900  ceFEPIme (MAXIPIME) 2 g in dextrose 5 % 50 mL IVPB  Status:  Discontinued     2 g 100 mL/hr over 30 Minutes Intravenous Every 12 hours 12/26/16 1137 12/29/16 0840   12/26/16 0600  vancomycin (VANCOCIN) 2,000 mg in sodium chloride 0.9 % 500 mL IVPB     2,000 mg 250 mL/hr over 120 Minutes Intravenous  Once 12/26/16 0554 12/26/16 0855   12/26/16 0600  ceFEPIme (MAXIPIME) 2 g in dextrose 5 % 50 mL IVPB     2 g 100 mL/hr over 30 Minutes Intravenous  Once 12/26/16 0554 12/26/16 0726          Subjective: Patient sitting in chair this morning. His main complaint is being thirsty. He denies any chest pain or shortness of breath on exam.  Objective: Vitals:   01/02/17 0500 01/02/17 0600 01/02/17 0700 01/02/17 0755  BP: 134/73 122/69 133/68   Pulse: 91 93 82   Resp: (!) 24 (!) 26 (!) 26   Temp:    98.4 F (36.9 C)  TempSrc:    Oral  SpO2: 98% 98% 97%   Weight:  113.7 kg (250 lb 10.6 oz)    Height:        Intake/Output Summary (Last 24 hours) at 01/02/17 1126 Last data filed at 01/02/17 1014  Gross per 24 hour  Intake             1065 ml  Output             1245 ml  Net             -180 ml   Filed Weights   12/31/16 0500 01/01/17 0500 01/02/17 0600  Weight: 114.5 kg (252 lb 6.8 oz) 114.6 kg (252 lb 10.4 oz) 113.7 kg (250 lb 10.6 oz)    Examination:  General exam: Appears calm and comfortable  Respiratory system: Crackles left base. Respiratory effort normal. Cardiovascular system: S1 & S2 heard, RRR. No JVD, murmurs, rubs, gallops or clicks. No pedal edema. Gastrointestinal system: Abdomen is nondistended, soft and nontender. No organomegaly or masses felt. Normal bowel sounds heard. Central nervous system: Alert and oriented. No focal neurological deficits. Extremities: Symmetric 5 x 5 power. No edema appreciated Skin: No rashes, lesions or ulcers on exposed skin Psychiatry: Judgement and insight appear normal. Mood & affect appropriate.   Data Reviewed: I  have personally reviewed following labs and imaging studies  CBC:  Recent Labs Lab 12/27/16 0241 12/27/16 1351 12/28/16 0331 12/29/16 0355 01/01/17 0220 01/02/17 0320  WBC 20.2*  --  19.5* 14.3* 10.6* 10.6*  NEUTROABS  --   --   --   --   --  8.2*  HGB 12.0* 12.6* 11.6* 10.6* 11.2* 10.9*  HCT 39.0 37.0* 38.4* 35.3* 35.5* 35.9*  MCV 99.0  --  99.0 98.9 94.7 97.0  PLT 266  --  213 178 201 765   Basic Metabolic Panel:  Recent Labs Lab 12/30/16 0400 12/30/16 1907 12/31/16 0224 12/31/16 1546 01/01/17 0220 01/01/17 0232 01/01/17 0854 01/02/17 0320  NA 137 137 137 139  --  140 141 143  143  K 4.3 4.0 3.7 3.8  --  3.7 3.8 4.5  4.5  CL 101 102 102 100*  --  97* 100* 101  102  CO2 27 25 25 27   --  30 30 31  30   GLUCOSE 152* 183* 178* 168*  --  159* 164* 141*  142*  BUN 26* 45* 56* 60*  --  60* 64* 61*  62*  CREATININE 1.29* 1.83* 1.98* 1.86*  --  1.67* 1.68* 1.44*  1.41*  CALCIUM 8.3* 8.3* 8.3* 8.8*  --  9.3 9.3 9.2  9.2  MG 2.7*  --  2.8*  --  2.3  --  2.5* 2.6*  PHOS 1.8* 2.9 3.4 3.8  --  3.1  --  4.5  4.4   GFR: Estimated Creatinine Clearance: 64.4 mL/min (A) (by C-G formula based on SCr of 1.41 mg/dL (H)). Liver Function Tests:  Recent Labs Lab 12/30/16 1730 12/30/16 1907 12/31/16 0224 12/31/16 1546 01/01/17 0232 01/02/17 0320  AST  --   --  29  --   --   --   ALT  --   --  15*  --   --   --   ALKPHOS  --   --  55  --   --   --   BILITOT  --   --  0.4  --   --   --   PROT 5.8*  --  5.8*  --   --   --   ALBUMIN  --  2.3* 2.2* 2.6* 2.8* 2.4*   No results for input(s): LIPASE, AMYLASE in the last 168 hours. No results for input(s): AMMONIA in the last 168 hours. Coagulation Profile:  Recent Labs Lab 12/30/16 0400  INR 1.19   Cardiac Enzymes:  Recent Labs Lab 12/26/16 1632 12/26/16 2230 01/01/17 0924  TROPONINI 0.06* 0.07* 0.06*   BNP (last 3 results) No results for input(s): PROBNP in the last 8760 hours. HbA1C: No results for  input(s): HGBA1C in the last 72 hours. CBG:  Recent Labs Lab 01/01/17 1524 01/01/17 2026 01/01/17 2307 01/02/17 0321 01/02/17 0751  GLUCAP 179* 216* 164* 150* 178*   Lipid Profile:  Recent Labs  12/30/16 1907  CHOL 97   Thyroid Function Tests: No results for input(s): TSH, T4TOTAL, FREET4, T3FREE, THYROIDAB in the last 72 hours. Anemia Panel: No results for input(s): VITAMINB12, FOLATE, FERRITIN, TIBC, IRON, RETICCTPCT in the last 72 hours. Sepsis Labs: No results for input(s): PROCALCITON, LATICACIDVEN in the last 168 hours.  Recent Results (from the past 240 hour(s))  MRSA PCR Screening     Status: None   Collection Time: 12/26/16  2:44 AM  Result Value Ref Range Status   MRSA by PCR NEGATIVE NEGATIVE Final    Comment:        The GeneXpert MRSA Assay (FDA approved for NASAL specimens only), is one component of a comprehensive MRSA colonization surveillance program. It is not intended to diagnose MRSA infection nor to guide or monitor treatment for MRSA infections.   Culture, Urine     Status: None   Collection Time: 12/28/16 10:34 AM  Result Value Ref Range Status   Specimen Description URINE,  CATHETERIZED  Final   Special Requests Normal  Final   Culture NO GROWTH  Final   Report Status 12/29/2016 FINAL  Final  Culture, respiratory (NON-Expectorated)     Status: None   Collection Time: 12/28/16 11:10 AM  Result Value Ref Range Status   Specimen Description TRACHEAL ASPIRATE  Final   Special Requests Normal  Final   Gram Stain   Final    FEW WBC PRESENT,BOTH PMN AND MONONUCLEAR NO ORGANISMS SEEN    Culture NO GROWTH 2 DAYS  Final   Report Status 12/30/2016 FINAL  Final  Culture, group A strep     Status: None   Collection Time: 12/28/16  2:52 PM  Result Value Ref Range Status   Specimen Description THROAT  Final   Special Requests NONE  Final   Culture NO GROUP A STREP (S.PYOGENES) ISOLATED  Final   Report Status 12/30/2016 FINAL  Final  Gram stain      Status: None   Collection Time: 12/30/16  5:11 PM  Result Value Ref Range Status   Specimen Description FLUID RIGHT PLEURAL  Final   Special Requests NONE  Final   Gram Stain   Final    ABUNDANT WBC PRESENT,BOTH PMN AND MONONUCLEAR NO ORGANISMS SEEN    Report Status 12/30/2016 FINAL  Final  Culture, body fluid-bottle     Status: None (Preliminary result)   Collection Time: 12/30/16  5:11 PM  Result Value Ref Range Status   Specimen Description FLUID RIGHT PLEURAL  Final   Special Requests BOTTLES DRAWN AEROBIC AND ANAEROBIC 10CC  Final   Culture NO GROWTH 2 DAYS  Final   Report Status PENDING  Incomplete       Radiology Studies: Dg Chest Port 1 View  Result Date: 01/02/2017 CLINICAL DATA:  Cardiomegaly EXAM: PORTABLE CHEST 1 VIEW COMPARISON:  January 01, 2017 FINDINGS: Central catheter tip is at the junction of the left innominate vein and superior vena cava. No pneumothorax. There is cardiomegaly with pulmonary vascular within normal limits. No edema or consolidation. No bone lesions. IMPRESSION: Stable cardiomegaly. Central catheter as described. No pneumothorax. No edema or consolidation. Electronically Signed   By: Lowella Grip III M.D.   On: 01/02/2017 09:03   Dg Chest Port 1 View  Result Date: 01/01/2017 CLINICAL DATA:  Extubated yesterday. EXAM: PORTABLE CHEST 1 VIEW COMPARISON:  Yesterday. FINDINGS: The endotracheal tube and nasogastric tube have been removed. Left jugular catheter tip in the superior vena cava. Stable enlarged cardiac silhouette and mild right basilar airspace opacity. Thoracic spine degenerative changes. IMPRESSION: Stable cardiomegaly and mild right basilar atelectasis. Electronically Signed   By: Claudie Revering M.D.   On: 01/01/2017 08:08      Scheduled Meds: . Chlorhexidine Gluconate Cloth  6 each Topical Daily  . clopidogrel  75 mg Oral Daily  . heparin subcutaneous  5,000 Units Subcutaneous Q8H  . heparin lock flush  1,000 Units Intravenous  Once  . insulin aspart  0-9 Units Subcutaneous TID WC  . mouth rinse  15 mL Mouth Rinse BID  . pantoprazole sodium  40 mg Oral QHS  . RESOURCE THICKENUP CLEAR   Oral Once  . sodium chloride flush  10 mL Intravenous Q12H  . sodium chloride flush  3 mL Intravenous Q12H   Continuous Infusions: . heparin 10,000 units/ 20 mL infusion syringe 2,000 Units/hr (12/30/16 0610)     LOS: 7 days    Time spent: 40 minutes   Dessa Phi, DO [facility] [email or web address] Password [facility] 01/02/2017, 11:26 AM

## 2017-01-02 NOTE — Consult Note (Signed)
[place] Nurse wound consult note Reason for Consult: Scattered areas of partial thickness tissue loss and a few areas of full thickness tissue loss that have healed (as evidenced by scarring).  Very dry skin on bilateral LEs and heels. Wound type: Partial thickness tissue loss. Pressure Injury POA: No Measurement: Scattered areas with largest open wound measuring 0.8cm x 0.4cm x 0.1cm. Wound LMB:EMLJ, dry Drainage (amount, consistency, odor) none Periwound:intact, dry with areas of previous wound healing noted. Dressing procedure/placement/frequency: I will provide a pressure redistribution chair cushion, and provide Nursing with skin care orders for twice daily washing and drying of LEs followed by application of an emollient, Eucerin.  LEs are to be elevated and heels floated while in bed or chair. [place] nursing team will not follow, but will remain available to this patient, the nursing and medical teams.  Please re-consult if needed. Thanks, Maudie Flakes, MSN, RN, [place], Arther Abbott  Pager# [phone]

## 2017-01-03 LAB — RENAL FUNCTION PANEL
Albumin: 2.8 g/dL — ABNORMAL LOW (ref 3.5–5.0)
Anion gap: 11 (ref 5–15)
BUN: 55 mg/dL — AB (ref 6–20)
CHLORIDE: 94 mmol/L — AB (ref 101–111)
CO2: 29 mmol/L (ref 22–32)
CREATININE: 1.28 mg/dL — AB (ref 0.61–1.24)
Calcium: 9 mg/dL (ref 8.9–10.3)
GFR calc Af Amer: >60 mL/min (ref >60)
GFR, EST NON AFRICAN AMERICAN: 55 mL/min — AB (ref >60)
GLUCOSE: 162 mg/dL — AB (ref 65–99)
POTASSIUM: 4.1 mmol/L (ref 3.5–5.1)
Phosphorus: 3.2 mg/dL (ref 2.5–4.6)
Sodium: 134 mmol/L — ABNORMAL LOW (ref 135–145)

## 2017-01-03 LAB — CBC
HEMATOCRIT: 36.9 % — AB (ref 39.0–52.0)
HEMOGLOBIN: 11.5 g/dL — AB (ref 13.0–17.0)
MCH: 29.6 pg (ref 26.0–34.0)
MCHC: 31.2 g/dL (ref 30.0–36.0)
MCV: 94.9 fL (ref 78.0–100.0)
Platelets: 237 10*3/uL (ref 150–400)
RBC: 3.89 MIL/uL — AB (ref 4.22–5.81)
RDW: 16.5 % — ABNORMAL HIGH (ref 11.5–15.5)
WBC: 11.1 10*3/uL — ABNORMAL HIGH (ref 4.0–10.5)

## 2017-01-03 LAB — MAGNESIUM: MAGNESIUM: 2.3 mg/dL (ref 1.7–2.4)

## 2017-01-03 LAB — APTT: aPTT: 32 seconds (ref 24–36)

## 2017-01-03 LAB — GLUCOSE, CAPILLARY
GLUCOSE-CAPILLARY: 140 mg/dL — AB (ref 65–99)
GLUCOSE-CAPILLARY: 171 mg/dL — AB (ref 65–99)
GLUCOSE-CAPILLARY: 167 mg/dL — AB (ref 65–99)

## 2017-01-03 LAB — ADENOSIDE DEAMINASE, PLEURAL FL

## 2017-01-03 MED ORDER — HEPARIN SODIUM (PORCINE) 1000 UNIT/ML DIALYSIS
1000.0000 [IU] | INTRAMUSCULAR | Status: DC | PRN
  Filled 2017-01-03: qty 1

## 2017-01-03 MED ORDER — SODIUM CHLORIDE 0.9 % IV SOLN: 100.0000 mL | INTRAVENOUS | Status: DC | PRN

## 2017-01-03 MED ORDER — LIDOCAINE HCL (PF) 1 % IJ SOLN: 5.0000 mL | INTRAMUSCULAR | Status: DC | PRN

## 2017-01-03 MED ORDER — HEPARIN SODIUM (PORCINE) 1000 UNIT/ML DIALYSIS
20.0000 [IU]/kg | INTRAMUSCULAR | Status: DC | PRN
  Filled 2017-01-03: qty 3

## 2017-01-03 MED ORDER — LIDOCAINE-PRILOCAINE 2.5-2.5 % EX CREA
1.0000 "application " | TOPICAL_CREAM | CUTANEOUS | Status: DC | PRN
  Filled 2017-01-03: qty 5

## 2017-01-03 MED ORDER — ALTEPLASE 2 MG IJ SOLR: 2.0000 mg | Freq: Once | INTRAMUSCULAR | Status: DC | PRN

## 2017-01-03 MED ORDER — PENTAFLUOROPROP-TETRAFLUOROETH EX AERO: 1.0000 "application " | INHALATION_SPRAY | CUTANEOUS | Status: DC | PRN

## 2017-01-03 NOTE — Progress Notes (Signed)
PROGRESS NOTE    Christian Garner  TGG:[id number] DOB: 27-Dec-1945 DOA: 12/26/2016 PCP: [name], MD     Brief Narrative:  Christian Garner is a 71 y/o man with PMH of CHF with EF of ~30%, CAD with multiple stents, most recently 6/17 (DES), DM2 on metformin and januvia, LE edema and cellulitis currently on bactrim.  He was recently hospitalized at [facility] for CHF exacerbation and LE cellulitis, discharged on March 1st.  He was sent home on lasix, metolazone (weekly), bactrim, lisinopril, carvedilol and metformin.  He continued to have significant orthopnea after discharge and his family reports he has been unable to sleep because he has been unable to lay flat.  He went to the [facility] [place] ED on 12/25/16 with worsening shortness of breath and was found to have an SpO2 in the low to mid 80s on room air as well as a potassium of 8.5, Cr of 3.7 increased from 1.5 at discharge and a right sided pleural effusion. His ABG was 7.15/54 and he was started on bipap and a bicarb gtt.  He was also given albuterol, calcium, insulin and glucose and kayexalate for his hyperkalemia.  His shortness of breath improved on BiPap and he was transferred to [facility] for further care under PCCM service. However, after transfer, his clinical status declined and ultimately required mechanical ventilation as well as CRRT.  SIGNIFICANT EVENTS: 3/17 Bipap, start CRRT, intubated, central line 3/21 left thora 2000 cc 3/22 Extubated 3/24 Transferred to [facility]   Assessment & Plan:   Active Problems:   Respiratory failure (HCC)   AKI (acute kidney injury) ([place])   Endotracheal tube present   Pleural effusion  Acute hypoxemic respiratory failure -Due to volume overload, CHF -Now extubated and on room air  -S/p left thoracentesis 3/22, fluid culture pending, no growth to date   Acute kidney injury -Suspected to be hemodynamically mediated from CHF exacerbation, ACE inhibitor, diuretic  therapy, Bactrim use -CRRT 3/17-3/21 -Appreciate Nephrology assistance, no further needs for HD, HD line removed today, Nephrology signed off  Acute on chronic systolic and diastolic heart failure  -Echo EF 70-35%, grade 2 diastolic dysfunction -Has diuresed well with Lasix, improved with CRRT  Lower extremity cellulitis -Empiric Vanco and cefepime, then Rocephin, now stopped  CAD -Continue plavix   Diabetes type 2 -SSI  Hematuria -Urology consulted 3/17, likely related to prosthetic bleeding. Flush Foley when necessary for clots. No further recommendations. Foley removed    DVT prophylaxis: subq hep Code Status: full Family Communication: no family at bedside Disposition Plan: improving, PT OT to eval, suspect discharge next 1-2 days to either SNF vs HHPT. Transfer out of SDU today   Consultants:   PCCM  Nephrology  Antimicrobials:  Anti-infectives    Start     Dose/Rate Route Frequency Ordered Stop   12/30/16 1000  cefTRIAXone (ROCEPHIN) 2 g in dextrose 5 % 50 mL IVPB  Status:  Discontinued     2 g 100 mL/hr over 30 Minutes Intravenous Every 24 hours 12/29/16 0955 01/02/17 1122   12/29/16 1030  cefTRIAXone (ROCEPHIN) 2 g in dextrose 5 % 50 mL IVPB  Status:  Discontinued     2 g 100 mL/hr over 30 Minutes Intravenous NOW 12/29/16 0955 12/29/16 1009   12/29/16 1030  cefTRIAXone (ROCEPHIN) 1 g in dextrose 5 % 50 mL IVPB     1 g 100 mL/hr over 30 Minutes Intravenous NOW 12/29/16 1009 12/29/16 1108   12/29/16 1000  cefTRIAXone (ROCEPHIN) 2 g in dextrose 5 % 50 mL IVPB  Status:  Discontinued     2 g 100 mL/hr over 30 Minutes Intravenous Every 24 hours 12/29/16 0926 12/29/16 0955   12/29/16 0930  cefTRIAXone (ROCEPHIN) 1 g in dextrose 5 % 50 mL IVPB  Status:  Discontinued     1 g 100 mL/hr over 30 Minutes Intravenous Every 24 hours 12/29/16 0841 12/29/16 0926   12/27/16 0700  vancomycin (VANCOCIN) 1,250 mg in sodium chloride 0.9 % 250 mL IVPB  Status:  Discontinued      1,250 mg 166.7 mL/hr over 90 Minutes Intravenous Every 24 hours 12/26/16 1137 12/28/16 1028   12/27/16 0600  ceFEPIme (MAXIPIME) 1 g in dextrose 5 % 50 mL IVPB  Status:  Discontinued     1 g 100 mL/hr over 30 Minutes Intravenous Every 24 hours 12/26/16 0638 12/26/16 1137   12/26/16 1900  ceFEPIme (MAXIPIME) 2 g in dextrose 5 % 50 mL IVPB  Status:  Discontinued     2 g 100 mL/hr over 30 Minutes Intravenous Every 12 hours 12/26/16 1137 12/29/16 0840   12/26/16 0600  vancomycin (VANCOCIN) 2,000 mg in sodium chloride 0.9 % 500 mL IVPB     2,000 mg 250 mL/hr over 120 Minutes Intravenous  Once 12/26/16 0554 12/26/16 0855   12/26/16 0600  ceFEPIme (MAXIPIME) 2 g in dextrose 5 % 50 mL IVPB     2 g 100 mL/hr over 30 Minutes Intravenous  Once 12/26/16 0554 12/26/16 0726          Subjective: Patient sitting in chair this morning. He has no complaints this morning. Denies any chest pain or shortness of breath, no nausea or vomiting, no abdominal pain, no peripheral edema. He is excited to be able to ambulate with therapy today.   Objective: Vitals:   01/03/17 0041 01/03/17 0236 01/03/17 0542 01/03/17 0730  BP: 131/66  128/63 121/67  Pulse: 95  81 92  Resp: (!) 24   19  Temp: 98.2 F (36.8 C)  97.7 F (36.5 C) 98.9 F (37.2 C)  TempSrc:   Oral Oral  SpO2: 92%  96% 96%  Weight:  116.9 kg (257 lb 12.8 oz)    Height:        Intake/Output Summary (Last 24 hours) at 01/03/17 1025 Last data filed at 01/03/17 0900  Gross per 24 hour  Intake             1180 ml  Output             1025 ml  Net              155 ml   Filed Weights   01/01/17 0500 01/02/17 0600 01/03/17 0236  Weight: 114.6 kg (252 lb 10.4 oz) 113.7 kg (250 lb 10.6 oz) 116.9 kg (257 lb 12.8 oz)    Examination:  General exam: Appears calm and comfortable  Respiratory system: CTAB. Respiratory effort normal. Cardiovascular system: S1 & S2 heard, RRR. No JVD, murmurs, rubs, gallops or clicks. No pedal  edema. Gastrointestinal system: Abdomen is nondistended, soft and nontender. No organomegaly or masses felt. Normal bowel sounds heard. Central nervous system: Alert and oriented. No focal neurological deficits. Extremities: Symmetric 5 x 5 power. No edema appreciated Skin: No rashes, lesions or ulcers on exposed skin, dry legs  Psychiatry: Judgement and insight appear normal. Mood & affect appropriate.   Data Reviewed: I have personally reviewed following labs and imaging studies  CBC:  Recent Labs Lab 12/28/16 0331 12/29/16 0355 01/01/17 0220 01/02/17 0320 01/03/17 0300  WBC 19.5* 14.3* 10.6* 10.6* 11.1*  NEUTROABS  --   --   --  8.2*  --   HGB 11.6* 10.6* 11.2* 10.9* 11.5*  HCT 38.4* 35.3* 35.5* 35.9* 36.9*  MCV 99.0 98.9 94.7 97.0 94.9  PLT 213 178 201 212 099   Basic Metabolic Panel:  Recent Labs Lab 12/31/16 0224 12/31/16 1546 01/01/17 0220 01/01/17 0232 01/01/17 0854 01/02/17 0320 01/03/17 0300  NA 137 139  --  140 141 143  143 134*  K 3.7 3.8  --  3.7 3.8 4.5  4.5 4.1  CL 102 100*  --  97* 100* 101  102 94*  CO2 25 27  --  30 30 31  30 29   GLUCOSE 178* 168*  --  159* 164* 141*  142* 162*  BUN 56* 60*  --  60* 64* 61*  62* 55*  CREATININE 1.98* 1.86*  --  1.67* 1.68* 1.44*  1.41* 1.28*  CALCIUM 8.3* 8.8*  --  9.3 9.3 9.2  9.2 9.0  MG 2.8*  --  2.3  --  2.5* 2.6* 2.3  PHOS 3.4 3.8  --  3.1  --  4.5  4.4 3.2   GFR: Estimated Creatinine Clearance: 71.9 mL/min (A) (by C-G formula based on SCr of 1.28 mg/dL (H)). Liver Function Tests:  Recent Labs Lab 12/30/16 1730  12/31/16 0224 12/31/16 1546 01/01/17 0232 01/02/17 0320 01/03/17 0300  AST  --   --  29  --   --   --   --   ALT  --   --  15*  --   --   --   --   ALKPHOS  --   --  55  --   --   --   --   BILITOT  --   --  0.4  --   --   --   --   PROT 5.8*  --  5.8*  --   --   --   --   ALBUMIN  --   < > 2.2* 2.6* 2.8* 2.4* 2.8*  < > = values in this interval not displayed. No results for  input(s): LIPASE, AMYLASE in the last 168 hours. No results for input(s): AMMONIA in the last 168 hours. Coagulation Profile:  Recent Labs Lab 12/30/16 0400  INR 1.19   Cardiac Enzymes:  Recent Labs Lab 01/01/17 0924  TROPONINI 0.06*   BNP (last 3 results) No results for input(s): PROBNP in the last 8760 hours. HbA1C: No results for input(s): HGBA1C in the last 72 hours. CBG:  Recent Labs Lab 01/02/17 0751 01/02/17 1226 01/02/17 1631 01/02/17 2104 01/03/17 0729  GLUCAP 178* 226* 150* 186* 140*   Lipid Profile: No results for input(s): CHOL, HDL, LDLCALC, TRIG, CHOLHDL, LDLDIRECT in the last 72 hours. Thyroid Function Tests: No results for input(s): TSH, T4TOTAL, FREET4, T3FREE, THYROIDAB in the last 72 hours. Anemia Panel: No results for input(s): VITAMINB12, FOLATE, FERRITIN, TIBC, IRON, RETICCTPCT in the last 72 hours. Sepsis Labs: No results for input(s): PROCALCITON, LATICACIDVEN in the last 168 hours.  Recent Results (from the past 240 hour(s))  MRSA PCR Screening     Status: None   Collection Time: 12/26/16  2:44 AM  Result Value Ref Range Status   MRSA by PCR NEGATIVE NEGATIVE Final    Comment:        The GeneXpert MRSA  Assay (FDA approved for NASAL specimens only), is one component of a comprehensive MRSA colonization surveillance program. It is not intended to diagnose MRSA infection nor to guide or monitor treatment for MRSA infections.   Culture, Urine     Status: None   Collection Time: 12/28/16 10:34 AM  Result Value Ref Range Status   Specimen Description URINE, CATHETERIZED  Final   Special Requests Normal  Final   Culture NO GROWTH  Final   Report Status 12/29/2016 FINAL  Final  Culture, respiratory (NON-Expectorated)     Status: None   Collection Time: 12/28/16 11:10 AM  Result Value Ref Range Status   Specimen Description TRACHEAL ASPIRATE  Final   Special Requests Normal  Final   Gram Stain   Final    FEW WBC PRESENT,BOTH PMN AND  MONONUCLEAR NO ORGANISMS SEEN    Culture NO GROWTH 2 DAYS  Final   Report Status 12/30/2016 FINAL  Final  Culture, group A strep     Status: None   Collection Time: 12/28/16  2:52 PM  Result Value Ref Range Status   Specimen Description THROAT  Final   Special Requests NONE  Final   Culture NO GROUP A STREP (S.PYOGENES) ISOLATED  Final   Report Status 12/30/2016 FINAL  Final  Gram stain     Status: None   Collection Time: 12/30/16  5:11 PM  Result Value Ref Range Status   Specimen Description FLUID RIGHT PLEURAL  Final   Special Requests NONE  Final   Gram Stain   Final    ABUNDANT WBC PRESENT,BOTH PMN AND MONONUCLEAR NO ORGANISMS SEEN    Report Status 12/30/2016 FINAL  Final  Culture, body fluid-bottle     Status: None (Preliminary result)   Collection Time: 12/30/16  5:11 PM  Result Value Ref Range Status   Specimen Description FLUID RIGHT PLEURAL  Final   Special Requests BOTTLES DRAWN AEROBIC AND ANAEROBIC 10CC  Final   Culture NO GROWTH 3 DAYS  Final   Report Status PENDING  Incomplete       Radiology Studies: Dg Chest Port 1 View  Result Date: 01/02/2017 CLINICAL DATA:  Cardiomegaly EXAM: PORTABLE CHEST 1 VIEW COMPARISON:  January 01, 2017 FINDINGS: Central catheter tip is at the junction of the left innominate vein and superior vena cava. No pneumothorax. There is cardiomegaly with pulmonary vascular within normal limits. No edema or consolidation. No bone lesions. IMPRESSION: Stable cardiomegaly. Central catheter as described. No pneumothorax. No edema or consolidation. Electronically Signed   By: Lowella Grip III M.D.   On: 01/02/2017 09:03      Scheduled Meds: . Chlorhexidine Gluconate Cloth  6 each Topical Daily  . clopidogrel  75 mg Oral Daily  . heparin subcutaneous  5,000 Units Subcutaneous Q8H  . hydrocerin   Topical BID  . insulin aspart  0-9 Units Subcutaneous TID WC  . mouth rinse  15 mL Mouth Rinse BID  . pantoprazole sodium  40 mg Oral QHS  .  RESOURCE THICKENUP CLEAR   Oral Once   Continuous Infusions:    LOS: 8 days    Time spent: 30 minutes   Dessa Phi, DO [facility] [email or web address] Password [facility] 01/03/2017, 10:25 AM

## 2017-01-03 NOTE — Progress Notes (Signed)
Patient ID: Christian Garner, male   DOB: Mar 06, 1946, 71 y.o.   MRN: [id number] [facility] Progress Note   Assessment/ Plan:   1. Acute kidney injury: Suspected to be hemodynamically mediated from CHF exacerbation in the face of ongoing ACE inhibitor/diuretic therapy and likely compounded by Bactrim. Creatinine 3 weeks ago was 1.5.   CRRT 3/17-3/21 - stopped b/c BP was better, felt  if he needed further HD he could maybe tolerate intermittent HD -  but given baseline creatinine 1.5 he could also maybe show some signs of recovery- which he has !!  No indications for HD today and dont think will need again.  Made 1100 of urine off of lasix and creatinine down below baseline 2. Hyperkalemia:   Resolved-  3.  Respiratory failure: Suspected to be secondary to CHF exacerbation,  Parameters improving with UF and thoracentesis- now extubated 4. Hematuria: Seen earlier by urology, serologies negative- is clearing some 5. Anemia- hgb stable   Renal will sign off- will remove HD line- I really dont think he needs renal specific follow up   Subjective:     Creatinine down again- making reasonable urine   Objective:   BP 121/67 (BP Location: Right Arm)   Pulse 92   Temp 98.9 F (37.2 C) (Oral)   Resp 19   Ht 6\' 2"  (1.88 m)   Wt 116.9 kg (257 lb 12.8 oz)   SpO2 96%   BMI 33.10 kg/m   Intake/Output Summary (Last 24 hours) at 01/03/17 4709 Last data filed at 01/03/17 0551  Gross per 24 hour  Intake             1540 ml  Output             1045 ml  Net              495 ml   Weight change: 3.237 kg (7 lb 2.2 oz)  Physical Exam: [id number] extubated, right neck hematoma- left IJ vascath placed 3/17 asking for water CVS: Pulse regular rhythm, normal rate, normal S1 and S2 Resp: Coarse breath sounds bilaterally-no rales Abd: Soft, obese, nontender Ext: mild lower extremity edema- but better  Imaging: Dg Chest Port 1 View  Result Date: 01/02/2017 CLINICAL DATA:   Cardiomegaly EXAM: PORTABLE CHEST 1 VIEW COMPARISON:  January 01, 2017 FINDINGS: Central catheter tip is at the junction of the left innominate vein and superior vena cava. No pneumothorax. There is cardiomegaly with pulmonary vascular within normal limits. No edema or consolidation. No bone lesions. IMPRESSION: Stable cardiomegaly. Central catheter as described. No pneumothorax. No edema or consolidation. Electronically Signed   By: Lowella Grip III M.D.   On: 01/02/2017 09:03    Labs: BMET  Recent Labs Lab 12/30/16 0400 12/30/16 1907 12/31/16 0224 12/31/16 1546 01/01/17 0232 01/01/17 0854 01/02/17 0320 01/03/17 0300  NA 137 137 137 139 140 141 143  143 134*  K 4.3 4.0 3.7 3.8 3.7 3.8 4.5  4.5 4.1  CL 101 102 102 100* 97* 100* 101  102 94*  CO2 27 25 25 27 30 30 31  30 29   GLUCOSE 152* 183* 178* 168* 159* 164* 141*  142* 162*  BUN 26* 45* 56* 60* 60* 64* 61*  62* 55*  CREATININE 1.29* 1.83* 1.98* 1.86* 1.67* 1.68* 1.44*  1.41* 1.28*  CALCIUM 8.3* 8.3* 8.3* 8.8* 9.3 9.3 9.2  9.2 9.0  PHOS 1.8* 2.9 3.4 3.8 3.1  --  4.5  4.4 3.2  CBC  Recent Labs Lab 12/29/16 0355 01/01/17 0220 01/02/17 0320 01/03/17 0300  WBC 14.3* 10.6* 10.6* 11.1*  NEUTROABS  --   --  8.2*  --   HGB 10.6* 11.2* 10.9* 11.5*  HCT 35.3* 35.5* 35.9* 36.9*  MCV 98.9 94.7 97.0 94.9  PLT 178 201 212 237   Medications:    . Chlorhexidine Gluconate Cloth  6 each Topical Daily  . clopidogrel  75 mg Oral Daily  . heparin subcutaneous  5,000 Units Subcutaneous Q8H  . heparin lock flush  1,000 Units Intravenous Once  . hydrocerin   Topical BID  . insulin aspart  0-9 Units Subcutaneous TID WC  . mouth rinse  15 mL Mouth Rinse BID  . pantoprazole sodium  40 mg Oral QHS  . RESOURCE THICKENUP CLEAR   Oral Once  . sodium chloride flush  10 mL Intravenous Q12H  . sodium chloride flush  3 mL Intravenous Q12H   Habeeb Puertas A  01/03/2017, 8:37 AM

## 2017-01-03 NOTE — Evaluation (Signed)
Physical Therapy Evaluation Patient Details Name: Christian Garner MRN: [id number] DOB: 05/16/1946 Today's Date: 01/03/2017   History of Present Illness  Patient is a 71 yo male admitted 12/26/16 with resp failure and intubated.  Patient with CHF exacerbation, and AKI on CRRT 3/17-3/21.  Extubated 3/22.    PMH:  CHF, EF 30-35%, CAD with mult stents, DM, LE edema/cellulitis  Clinical Impression  Patient presents with problems listed below.  Will benefit from acute PT to maximize functional mobility prior to discharge.  Patient very impulsive with decreased safety awareness.  With decreased strength, balance, activity tolerance impacting functional mobility and safety. Required +2 mod assist for ambulation of 30' with RW today. Recommend SNF at d/c for continued therapy.    Follow Up Recommendations SNF;Supervision/Assistance - 24 hour    Equipment Recommendations  Rolling walker with 5" wheels;3in1 (PT)    Recommendations for Other Services       Precautions / Restrictions Precautions Precautions: Fall Restrictions Weight Bearing Restrictions: No      Mobility  Bed Mobility Overal bed mobility: Needs Assistance Bed Mobility: Sit to Supine       Sit to supine: Min guard   General bed mobility comments: Verbal cues for technique.  Assist for safety.  Transfers Overall transfer level: Needs assistance Equipment used: Rolling walker (2 wheeled) Transfers: Sit to/from Stand Sit to Stand: Mod assist;+2 physical assistance         General transfer comment: Patient began standing prior to equipment being ready.  Assist to rise to standing and for balance.  Verbal cues to return to sitting on bed.  Patient sat before he had turned completely around with hands remaining on RW.  Fatigued and unable to follow directions.  Ambulation/Gait Ambulation/Gait assistance: Mod assist;+2 physical assistance Ambulation Distance (Feet): 30 Feet Assistive device: Rolling walker (2  wheeled) Gait Pattern/deviations: Step-through pattern;Decreased step length - right;Decreased step length - left;Decreased stride length;Shuffle;Leaning posteriorly;Staggering left;Staggering right;Trunk flexed Gait velocity: decreased Gait velocity interpretation: Below normal speed for age/gender General Gait Details: Verbal cues for safe use of RW.  Cues to stand upright and look forward during gait.  Patient fatigued quickly at which time patient began staggering to both sides, and losing balance posteriorly.  Required +2 mod assist to prevent fall.  Stairs            Wheelchair Mobility    Modified Rankin (Stroke Patients Only)       Balance Overall balance assessment: Needs assistance         Standing balance support: Bilateral upper extremity supported Standing balance-Leahy Scale: Poor                               Pertinent Vitals/Pain Pain Assessment: No/denies pain    Home Living Family/patient expects to be discharged to:: Private residence Living Arrangements: Spouse/significant other Available Help at Discharge: Family;Available 24 hours/day Type of Home: House Home Access: Stairs to enter Entrance Stairs-Rails: Right Entrance Stairs-Number of Steps: 2 Home Layout: One level Home Equipment: None      Prior Function Level of Independence: Independent         Comments: Drives     Hand Dominance        Extremity/Trunk Assessment   Upper Extremity Assessment Upper Extremity Assessment: Overall WFL for tasks assessed    Lower Extremity Assessment Lower Extremity Assessment: Generalized weakness       Communication   Communication: No  difficulties  Cognition Arousal/Alertness: Lethargic;Awake/alert Behavior During Therapy: Impulsive;Flat affect Overall Cognitive Status: No family/caregiver present to determine baseline cognitive functioning                                 General Comments: Patient oriented  x3 with increased time to answer.  Patient very impulsive, with decreased safety awareness.      General Comments      Exercises     Assessment/Plan    PT Assessment Patient needs continued PT services  PT Problem List Decreased strength;Decreased activity tolerance;Decreased balance;Decreased mobility;Decreased cognition;Decreased knowledge of use of DME;Decreased safety awareness;Cardiopulmonary status limiting activity;Obesity       PT Treatment Interventions DME instruction;Gait training;Functional mobility training;Therapeutic activities;Therapeutic exercise;Cognitive remediation;Patient/family education    PT Goals (Current goals can be found in the Care Plan section)  Acute Rehab PT Goals Patient Stated Goal: None stated PT Goal Formulation: With patient Time For Goal Achievement: 01/10/17 Potential to Achieve Goals: Fair    Frequency Min 3X/week   Barriers to discharge        Co-evaluation               End of Session Equipment Utilized During Treatment: Gait belt Activity Tolerance: Patient limited by fatigue;Patient limited by lethargy Patient left: in bed;with call bell/phone within reach;with bed alarm set Nurse Communication: Mobility status PT Visit Diagnosis: Unsteadiness on feet (R26.81);Other abnormalities of gait and mobility (R26.89);Muscle weakness (generalized) (M62.81)    Time: 6004-5997 PT Time Calculation (min) (ACUTE ONLY): 24 min   Charges:   PT Evaluation $PT Eval Moderate Complexity: 1 Procedure PT Treatments $Gait Training: 8-22 mins   PT G Codes:        Carita Pian. Sanjuana Kava, [facility] Acute Rehab Services Pager [phone]   Despina Pole 01/03/2017, 7:50 PM

## 2017-01-04 ENCOUNTER — Encounter (HOSPITAL_COMMUNITY): Payer: Self-pay

## 2017-01-04 DIAGNOSIS — E669 Obesity, unspecified: Secondary | ICD-10-CM

## 2017-01-04 DIAGNOSIS — I5041 Acute combined systolic (congestive) and diastolic (congestive) heart failure: Secondary | ICD-10-CM

## 2017-01-04 DIAGNOSIS — E1169 Type 2 diabetes mellitus with other specified complication: Secondary | ICD-10-CM

## 2017-01-04 LAB — MAGNESIUM: MAGNESIUM: 2.3 mg/dL (ref 1.7–2.4)

## 2017-01-04 LAB — RENAL FUNCTION PANEL
ALBUMIN: 2.9 g/dL — AB (ref 3.5–5.0)
Anion gap: 12 (ref 5–15)
BUN: 45 mg/dL — ABNORMAL HIGH (ref 6–20)
CO2: 29 mmol/L (ref 22–32)
CREATININE: 1.26 mg/dL — AB (ref 0.61–1.24)
Calcium: 9 mg/dL (ref 8.9–10.3)
Chloride: 94 mmol/L — ABNORMAL LOW (ref 101–111)
GFR, EST NON AFRICAN AMERICAN: 56 mL/min — AB (ref >60)
Glucose, Bld: 156 mg/dL — ABNORMAL HIGH (ref 65–99)
PHOSPHORUS: 3.4 mg/dL (ref 2.5–4.6)
POTASSIUM: 3.9 mmol/L (ref 3.5–5.1)
Sodium: 135 mmol/L (ref 135–145)

## 2017-01-04 LAB — CBC
HCT: 38.4 % — ABNORMAL LOW (ref 39.0–52.0)
HEMOGLOBIN: 11.8 g/dL — AB (ref 13.0–17.0)
MCH: 29.1 pg (ref 26.0–34.0)
MCHC: 30.7 g/dL (ref 30.0–36.0)
MCV: 94.8 fL (ref 78.0–100.0)
PLATELETS: 243 10*3/uL (ref 150–400)
RBC: 4.05 MIL/uL — AB (ref 4.22–5.81)
RDW: 16.4 % — ABNORMAL HIGH (ref 11.5–15.5)
WBC: 10.7 10*3/uL — AB (ref 4.0–10.5)

## 2017-01-04 LAB — CULTURE, BODY FLUID W GRAM STAIN -BOTTLE: Culture: NO GROWTH

## 2017-01-04 LAB — GLUCOSE, CAPILLARY
GLUCOSE-CAPILLARY: 160 mg/dL — AB (ref 65–99)
GLUCOSE-CAPILLARY: 173 mg/dL — AB (ref 65–99)
GLUCOSE-CAPILLARY: 172 mg/dL — AB (ref 65–99)
Glucose-Capillary: 245 mg/dL — ABNORMAL HIGH (ref 65–99)

## 2017-01-04 LAB — CHOLESTEROL, BODY FLUID: Cholesterol, Fluid: 45 mg/dL

## 2017-01-04 LAB — CULTURE, BODY FLUID-BOTTLE

## 2017-01-04 LAB — APTT: aPTT: 33 seconds (ref 24–36)

## 2017-01-04 MED ORDER — CARVEDILOL 6.25 MG PO TABS: 6.2500 mg | ORAL_TABLET | Freq: Two times a day (BID) | ORAL | Status: DC

## 2017-01-04 MED ORDER — FLUTICASONE PROPIONATE 50 MCG/ACT NA SUSP: 1.0000 | Freq: Every day | NASAL | Status: DC | PRN

## 2017-01-04 MED ORDER — CARVEDILOL 3.125 MG PO TABS
3.1250 mg | ORAL_TABLET | Freq: Two times a day (BID) | ORAL | Status: DC
  Administered 2017-01-04 – 2017-01-05 (×3): 3.125 mg via ORAL
  Filled 2017-01-04 (×3): qty 1

## 2017-01-04 MED ORDER — ATORVASTATIN CALCIUM 40 MG PO TABS
40.0000 mg | ORAL_TABLET | Freq: Every day | ORAL | Status: DC
  Administered 2017-01-04: 40 mg via ORAL
  Filled 2017-01-04: qty 1

## 2017-01-04 NOTE — Progress Notes (Signed)
  Speech Language Pathology Treatment: Dysphagia  Patient Details Name: Christian Garner MRN: [id number] DOB: 06/10/1946 Today's Date: 01/04/2017 Time: 7373-6681 SLP Time Calculation (min) (ACUTE ONLY): 18 min  Assessment / Plan / Recommendation Clinical Impression  Skilled observation with self feeding of current diet complete. Patient able to consume dysphagia 3 solids and nectar thick liquids without overt s/s of aspiration, min verbal cueing for slowed rate of intake and small bolus size, primarily with liquids. Vocal quality remains moderately hoarse but improving per patient since initial FEES. Per patient, plans are for possible discharge 3/27. Recommend repeat FEES 3/27 am to determine potential to advance liquids prior to discharge. MD, please order if agree. Overall, patient tolerating current diet.    HPI HPI: 71 y/o presenting with hypoxic, hypercarbic respiratory failure, AKI, hyperkalemia and possible sepsis, ETT 3/17-3/22.      SLP Plan   (FEES 3/27)       Recommendations  Diet recommendations: Dysphagia 3 (mechanical soft);Nectar-thick liquid Liquids provided via: Cup;No straw Medication Administration: Crushed with puree Supervision: Patient able to self feed;Full supervision/cueing for compensatory strategies Compensations: Minimize environmental distractions;Slow rate;Small sips/bites Postural Changes and/or Swallow Maneuvers: Seated upright 90 degrees                Oral Care Recommendations: Oral care BID Follow up Recommendations:  (TBD pending FEES) SLP Visit Diagnosis: Dysphagia, pharyngeal phase (R13.13) Plan:  (FEES 3/27)       [place] MA, CCC-SLP [phone]    Arayla Kruschke Meryl 01/04/2017, 9:53 AM

## 2017-01-04 NOTE — Progress Notes (Addendum)
Pt family at bedside, update give. Family aware MD requested pt to follow up with his cardiologist in [place], Dr. Jimmie Molly, in 1 week for CHF.   Christian Garner

## 2017-01-04 NOTE — Progress Notes (Signed)
Occupational Therapy Evaluation Patient Details Name: Christian Garner MRN: [id number] DOB: 03-May-1946 Today's Date: 01/04/2017    History of Present Illness Patient is a 71 yo male admitted 12/26/16 with resp failure and intubated.  Patient with CHF exacerbation, and AKI on CRRT 3/17-3/21.  Extubated 3/22.    PMH:  CHF, EF 30-35%, CAD with mult stents, DM, LE edema/cellulitis   Clinical Impression   PTA, pt independent with ADL and mobility . Pt currently requireis min A with mobility @ RW level and mod A with ADL. Pt fatigues easily. Pt would benefit from rehab at [facility] but apparently refusing.If DC home, will need HHOT and [place]. Patient's daughters are concerned about his ability to safely DC home with his wife. Again, recommended short rehab stay, however, pt/wife declining. Will plan to see in am if able. VSS throughout session.     Follow Up Recommendations  SNF;Supervision/Assistance - 24 hour  If goes to home, will need HHOT and [place] Recommendations  3 in 1 bedside commode;Other (comment) (RW)    Recommendations for Other Services       Precautions / Restrictions Precautions Precautions: Fall      Mobility Bed Mobility               General bed mobility comments: OOB in chair  Transfers Overall transfer level: Needs assistance Equipment used: Rolling walker (2 wheeled) Transfers: Sit to/from Stand Sit to Stand: Min assist         General transfer comment: repeated vc for safety    Balance Overall balance assessment: Needs assistance   Sitting balance-Leahy Scale: Good       Standing balance-Leahy Scale: Poor                             ADL either performed or assessed with clinical judgement   ADL Overall ADL's : Needs assistance/impaired     Grooming: Set up   Upper Body Bathing: Set up;Supervision/ safety;Sitting   Lower Body Bathing: Moderate assistance;Sit to/from stand   Upper Body Dressing : Minimal  assistance   Lower Body Dressing: Moderate assistance;Sit to/from stand   Toilet Transfer: Minimal assistance;Stand-pivot   Toileting- Clothing Manipulation and Hygiene: Moderate assistance;Sit to/from stand       Functional mobility during ADLs: Minimal assistance;Rolling walker;Cueing for safety General ADL Comments: Cues for safety. Pt easily fatigues     Vision         Perception     Praxis      Pertinent Vitals/Pain Pain Assessment: No/denies pain     Hand Dominance Right   Extremity/Trunk Assessment Upper Extremity Assessment Upper Extremity Assessment: Generalized weakness   Lower Extremity Assessment Lower Extremity Assessment: Generalized weakness   Cervical / Trunk Assessment Cervical / Trunk Assessment: Normal   Communication Communication Communication: No difficulties   Cognition Arousal/Alertness: Awake/alert Behavior During Therapy: Impulsive Overall Cognitive Status: Impaired/Different from baseline Area of Impairment: Attention;Safety/judgement;Awareness                   Current Attention Level: Selective     Safety/Judgement: Decreased awareness of safety;Decreased awareness of deficits Awareness: Emergent   General Comments: Pt inappropriate at times; 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" attempting to drink juice from fruit cup after being     General Comments       Exercises     Shoulder Instructions  Home Living Family/patient expects to be discharged to:: Private residence Living Arrangements: Spouse/significant other Available Help at Discharge: Family;Available 24 hours/day Type of Home: House Home Access: Stairs to enter [employer] of Steps: 2 Entrance Stairs-Rails: Right Home Layout: One level     Bathroom Shower/Tub: Occupational psychologist: Standard Bathroom Accessibility: Yes How Accessible: Accessible via walker Home Equipment: None          Prior  Functioning/Environment Level of Independence: Independent        Comments: drives; worked out at the [employer] List: Decreased strength;Decreased activity tolerance;Impaired balance (sitting and/or standing);Decreased cognition;Decreased safety awareness;Decreased knowledge of use of DME or AE;Cardiopulmonary status limiting activity;Obesity      OT Treatment/Interventions: Self-care/ADL training;Therapeutic exercise;Energy conservation;DME and/or AE instruction;Therapeutic activities;Cognitive remediation/compensation;Patient/family education;Balance training    OT Goals(Current goals can be found in the care plan section) Acute Rehab OT Goals Patient Stated Goal: to go home OT Goal Formulation: With patient Time For Goal Achievement: 01/18/17 Potential to Achieve Goals: Good  OT Frequency: Min 2X/week   Barriers to D/C:            Co-evaluation              End of Session Equipment Utilized During Treatment: Gait belt;Rolling walker Nurse Communication: Mobility status;Other (comment) (DC needs)  Activity Tolerance: Patient tolerated treatment well Patient left: in chair;with call bell/phone within reach;with chair alarm set;with family/visitor present  OT Visit Diagnosis: Unsteadiness on feet (R26.81);Muscle weakness (generalized) (M62.81)                Time: 1705-1730 OT Time Calculation (min): 25 min Charges:  OT General Charges $OT Visit: 1 Procedure OT Evaluation $OT Eval Moderate Complexity: 1 Procedure OT Treatments $Self Care/Home Management : 8-22 mins G-Codes:     [facility], OT/L  [phone] 01/04/2017  Desi Rowe,H[place] 01/04/2017, 6:09 PM

## 2017-01-04 NOTE — Progress Notes (Addendum)
SATURATION QUALIFICATIONS: (This note is used to comply with regulatory documentation for home oxygen)  Patient Saturations on Room Air at Rest = 96%  Patient Saturations on Room Air while Ambulating = 94%  Pt ambulated 60 feet with a walker, tolerated fair, pt stated he got tired. Pt stated he does not have a walker at home  [place]

## 2017-01-04 NOTE — Progress Notes (Signed)
Admission database completed with pt at bedside  [place]

## 2017-01-04 NOTE — Clinical Social Work Note (Signed)
Clinical Social Worker received referral for possible ST-SNF placement.  Chart reviewed.  CSW spoke with patient at bedside who adamantly refuses SNF placement at this time.  Patient is agreeable to home health and does not express preference of agency.  Spoke with RN Case Manager who will follow up with patient to discuss home health needs.    CSW signing off - please re consult if social work needs arise.  Barbette Or, [place]

## 2017-01-04 NOTE — Progress Notes (Signed)
Noted pt saturations sitting up in chair awake or asleep 98-100% on room air.  When pt sleeping in bed, saturations drop to 84-85% on room air.  Lungs to auscultation diminished and clear throughout.  O2 at 2L applied per [place] with saturations increased to 100%.  Will continue to monitor closely.

## 2017-01-04 NOTE — Progress Notes (Signed)
Paged MD that pt would benefit from a walker at discharge. Pt family at bedside. Pt states he does not want to go to a SNF that physical therapy recommends  Christian Garner

## 2017-01-04 NOTE — Care Management Important Message (Signed)
Important Message  Patient Details  Name: Christian Garner MRN: [id number] Date of Birth: [birth year]   Medicare Important Message Given:  Yes    Nathen May 01/04/2017, 3:38 PM

## 2017-01-04 NOTE — Progress Notes (Signed)
[facility] PROGRESS NOTE  Christian Garner NTZ:[id number] DOB: [birth year] DOA: 12/26/2016  PCP: [name], MD  Brief History/Interval Summary: 71 y/o man with PMH of CHF with EF of ~30%, CAD with multiple stents, most recently 6/17 (DES), DM2 on metformin and januvia, LE edema and cellulitis currently on bactrim.He was recently hospitalized at [facility] for CHF exacerbation and LE cellulitis, discharged on March 1st. He was sent home on lasix, metolazone (weekly), bactrim, lisinopril, carvedilol and metformin. He continued to have significant orthopnea after discharge. He went to the [name] ED on 12/25/16 with worsening shortness of breath and was found to have an SpO2 in the low to mid 80s on room air as well as a potassium of 8.5, Cr of 3.7 increased from 1.5 at discharge and a right sided pleural effusion. His ABG was 7.15/54 and he was started on bipap and a bicarb gtt. He was also given albuterol, calcium, insulin and glucose and kayexalate for his hyperkalemia. His shortness of breath improved on BiPap and he was transferred to [facility] for further care under PCCM service. However, after transfer, his clinical status declined and ultimately required mechanical ventilation as well as CRRT. Patient was stabilized. Extubated. And subsequently transferred to the floor.  Reason for Visit: Acute kidney injury. Acute systolic CHF.  Consultants: Nephrology. Pulmonology.  ANTIBIOTICS: Vanc 12/26/16 -->3/19 Cefepime 12/26/16 --->off 3/19 roc>>stop 24th  SIGNIFICANT EVENTS: CRRT 3/17 3/21 left thora 2000 cc 3/22 extubated  LINES/TUBES: ETT 3/17>>>3/22 L IJ trialysis catheter 3/17>>>3/25  Procedures:   Transthoracic echocardiogram Study Conclusions  - Left ventricle: The cavity size was moderately dilated. Wall   thickness was increased in a pattern of moderate LVH. Systolic   function was moderately to severely reduced. The estimated   ejection  fraction was in the range of 30% to 35%. Moderate   hypokinesis of the apicalanterolateral and apical myocardium.   Features are consistent with a pseudonormal left ventricular   filling pattern, with concomitant abnormal relaxation and   increased filling pressure (grade 2 diastolic dysfunction). - Aortic valve: Mildly calcified annulus. - Mitral valve: There was mild to moderate regurgitation. - Right ventricle: The cavity size was mildly dilated. Systolic   function was mildly reduced. - Right atrium: The atrium was moderately dilated. - Pericardium, extracardiac: A small pericardial effusion was   identified. There was no evidence of hemodynamic compromise.   Subjective/Interval History: Patient feels well this morning. Denies any difficulty breathing. Denies any chest pain. No cough. Urinating without any difficulty.  ROS: Denies any nausea or vomiting  Objective:  Vital Signs  Vitals:   01/03/17 0730 01/03/17 1132 01/03/17 1959 01/04/17 0142  BP: 121/67 138/70 123/69 132/67  Pulse: 92 90 80 80  Resp: 19 (!) 22 14 (!) 22  Temp: 98.9 F (37.2 C) 99 F (37.2 C) 97.7 F (36.5 C) 98.7 F (37.1 C)  TempSrc: Oral Oral    SpO2: 96% 94% 94% 100%  Weight:    108 kg (238 lb 1.6 oz)  Height:        Intake/Output Summary (Last 24 hours) at 01/04/17 1143 Last data filed at 01/04/17 0900  Gross per 24 hour  Intake              820 ml  Output             1075 ml  Net             -255 ml   Filed  Weights   01/02/17 0600 01/03/17 0236 01/04/17 0142  Weight: 113.7 kg (250 lb 10.6 oz) 116.9 kg (257 lb 12.8 oz) 108 kg (238 lb 1.6 oz)    General appearance: alert, cooperative, appears stated age and no distress Resp: Condition entry at the bases. Mostly clear to auscultation otherwise. Cardio: regular rate and rhythm, S1, S2 normal, no murmur, click, rub or gallop GI: soft, non-tender; bowel sounds normal; no masses,  no organomegaly Extremities: Minimal edema bilateral lower  extremities. Neurologic: Awake and alert. Oriented 3. No focal neurological deficits.  Lab Results:  Data Reviewed: I have personally reviewed following labs and imaging studies  CBC:  Recent Labs Lab 12/29/16 0355 01/01/17 0220 01/02/17 0320 01/03/17 0300 01/04/17 0341  WBC 14.3* 10.6* 10.6* 11.1* 10.7*  NEUTROABS  --   --  8.2*  --   --   HGB 10.6* 11.2* 10.9* 11.5* 11.8*  HCT 35.3* 35.5* 35.9* 36.9* 38.4*  MCV 98.9 94.7 97.0 94.9 94.8  PLT 178 201 212 237 277    Basic Metabolic Panel:  Recent Labs Lab 12/31/16 1546 01/01/17 0220 01/01/17 0232 01/01/17 0854 01/02/17 0320 01/03/17 0300 01/04/17 0341  NA 139  --  140 141 143  143 134* 135  K 3.8  --  3.7 3.8 4.5  4.5 4.1 3.9  CL 100*  --  97* 100* 101  102 94* 94*  CO2 27  --  30 30 31  30 29 29   GLUCOSE 168*  --  159* 164* 141*  142* 162* 156*  BUN 60*  --  60* 64* 61*  62* 55* 45*  CREATININE 1.86*  --  1.67* 1.68* 1.44*  1.41* 1.28* 1.26*  CALCIUM 8.8*  --  9.3 9.3 9.2  9.2 9.0 9.0  MG  --  2.3  --  2.5* 2.6* 2.3 2.3  PHOS 3.8  --  3.1  --  4.5  4.4 3.2 3.4    GFR: Estimated Creatinine Clearance: 70.4 mL/min (A) (by C-G formula based on SCr of 1.26 mg/dL (H)).  Liver Function Tests:  Recent Labs Lab 12/30/16 1730  12/31/16 0224 12/31/16 1546 01/01/17 0232 01/02/17 0320 01/03/17 0300 01/04/17 0341  AST  --   --  29  --   --   --   --   --   ALT  --   --  15*  --   --   --   --   --   ALKPHOS  --   --  55  --   --   --   --   --   BILITOT  --   --  0.4  --   --   --   --   --   PROT 5.8*  --  5.8*  --   --   --   --   --   ALBUMIN  --   < > 2.2* 2.6* 2.8* 2.4* 2.8* 2.9*  < > = values in this interval not displayed.   Coagulation Profile:  Recent Labs Lab 12/30/16 0400  INR 1.19    Cardiac Enzymes:  Recent Labs Lab 01/01/17 0924  TROPONINI 0.06*    CBG:  Recent Labs Lab 01/03/17 0729 01/03/17 1131 01/03/17 1645 01/04/17 0758 01/04/17 1113  GLUCAP 140* 171* 167*  160* 245*     Recent Results (from the past 240 hour(s))  MRSA PCR Screening     Status: None   Collection Time: 12/26/16  2:44 AM  Result Value Ref Range Status   MRSA by PCR NEGATIVE NEGATIVE Final    Comment:        The GeneXpert MRSA Assay (FDA approved for NASAL specimens only), is one component of a comprehensive MRSA colonization surveillance program. It is not intended to diagnose MRSA infection nor to guide or monitor treatment for MRSA infections.   Culture, Urine     Status: None   Collection Time: 12/28/16 10:34 AM  Result Value Ref Range Status   Specimen Description URINE, CATHETERIZED  Final   Special Requests Normal  Final   Culture NO GROWTH  Final   Report Status 12/29/2016 FINAL  Final  Culture, respiratory (NON-Expectorated)     Status: None   Collection Time: 12/28/16 11:10 AM  Result Value Ref Range Status   Specimen Description TRACHEAL ASPIRATE  Final   Special Requests Normal  Final   Gram Stain   Final    FEW WBC PRESENT,BOTH PMN AND MONONUCLEAR NO ORGANISMS SEEN    Culture NO GROWTH 2 DAYS  Final   Report Status 12/30/2016 FINAL  Final  Culture, group A strep     Status: None   Collection Time: 12/28/16  2:52 PM  Result Value Ref Range Status   Specimen Description THROAT  Final   Special Requests NONE  Final   Culture NO GROUP A STREP (S.PYOGENES) ISOLATED  Final   Report Status 12/30/2016 FINAL  Final  Gram stain     Status: None   Collection Time: 12/30/16  5:11 PM  Result Value Ref Range Status   Specimen Description FLUID RIGHT PLEURAL  Final   Special Requests NONE  Final   Gram Stain   Final    ABUNDANT WBC PRESENT,BOTH PMN AND MONONUCLEAR NO ORGANISMS SEEN    Report Status 12/30/2016 FINAL  Final  Culture, body fluid-bottle     Status: None (Preliminary result)   Collection Time: 12/30/16  5:11 PM  Result Value Ref Range Status   Specimen Description FLUID RIGHT PLEURAL  Final   Special Requests BOTTLES DRAWN AEROBIC AND  ANAEROBIC 10CC  Final   Culture NO GROWTH 4 DAYS  Final   Report Status PENDING  Incomplete      Radiology Studies: No results found.   Medications:  Scheduled: . atorvastatin  40 mg Oral QHS  . carvedilol  3.125 mg Oral BID WC  . Chlorhexidine Gluconate Cloth  6 each Topical Daily  . clopidogrel  75 mg Oral Daily  . heparin subcutaneous  5,000 Units Subcutaneous Q8H  . hydrocerin   Topical BID  . insulin aspart  0-9 Units Subcutaneous TID WC  . mouth rinse  15 mL Mouth Rinse BID  . pantoprazole sodium  40 mg Oral QHS  . RESOURCE THICKENUP CLEAR   Oral Once   Continuous:  QIW:LNLGXQ chloride, acetaminophen, albuterol, alteplase, docusate, fluticasone, ondansetron (ZOFRAN) IV, sodium chloride flush, sodium chloride flush  Assessment/Plan:  Active Problems:   Respiratory failure (HCC)   AKI (acute kidney injury) ([place])   Endotracheal tube present   Pleural effusion    Acute hypoxemic respiratory failure -Due to volume overload, CHF -Now extubated and on room air  -S/p left thoracentesis 3/22, fluid culture pending, no growth to date  - Patient did have an episode of hypoxia overnight. Normal this morning. Will need to have his oxygen saturations checked with ambulation.  Acute kidney injury -Suspected to be hemodynamically mediated from CHF exacerbation, ACE inhibitor, diuretic therapy, Bactrim use -CRRT 3/17-3/21 -  Appreciate Nephrology assistance, no further needs for HD, HD line removed 3/25, Nephrology signed off  Acute on chronic systolic and diastolic heart failure  -Echo EF 18-84%, grade 2 diastolic dysfunction -Has diuresed well with Lasix, improved with CRRT. Nephrology recommends keeping him off of Lasix for now. He will need close monitoring in the outpatient setting. His cardiologist is Dr. Tia Alert, Dr. Jimmie Molly. He will need to see his cardiologist next week. Home health will need to be arranged. Patient is refusing skilled nursing facility placement for  rehabilitation for now. Daily weights at home. Reinitiate his carvedilol. Continue to hold ACE inhibitor due to renal dysfunction.  Lower extremity cellulitis -Empiric Vanco and cefepime, then Rocephin, now stopped  CAD -Continue plavix. Stable  Diabetes type 2 -SSI. Monitor CBGs.  Hematuria -Urology consulted 3/17, likely related to prosthetic bleeding. Flush Foley when necessary for clots. No further recommendations. Foley removed.  DVT Prophylaxis: Subcutaneous heparin    Code Status: Full code  Family Communication: Discussed with the patient  Disposition Plan: FEES tomorrow. Room air saturations with ambulation. Patient refusing short-term rehabilitation at skilled nursing facility. Anticipate discharge home tomorrow.    LOS: 9 days   [facility] Pager [phone] 01/04/2017, 11:43 AM  If 7PM-7AM, please contact night-coverage at [email or web address], password [facility]

## 2017-01-05 LAB — BASIC METABOLIC PANEL
Anion gap: 13 (ref 5–15)
BUN: 44 mg/dL — AB (ref 6–20)
CHLORIDE: 95 mmol/L — AB (ref 101–111)
CO2: 27 mmol/L (ref 22–32)
CREATININE: 1.29 mg/dL — AB (ref 0.61–1.24)
Calcium: 9 mg/dL (ref 8.9–10.3)
GFR calc Af Amer: >60 mL/min (ref >60)
GFR calc non Af Amer: 54 mL/min — ABNORMAL LOW (ref >60)
Glucose, Bld: 160 mg/dL — ABNORMAL HIGH (ref 65–99)
POTASSIUM: 4.1 mmol/L (ref 3.5–5.1)
Sodium: 135 mmol/L (ref 135–145)

## 2017-01-05 LAB — CBC
HEMATOCRIT: 38 % — AB (ref 39.0–52.0)
HEMOGLOBIN: 12 g/dL — AB (ref 13.0–17.0)
MCH: 29.9 pg (ref 26.0–34.0)
MCHC: 31.6 g/dL (ref 30.0–36.0)
MCV: 94.5 fL (ref 78.0–100.0)
Platelets: 276 10*3/uL (ref 150–400)
RBC: 4.02 MIL/uL — ABNORMAL LOW (ref 4.22–5.81)
RDW: 16.5 % — ABNORMAL HIGH (ref 11.5–15.5)
WBC: 10.3 10*3/uL (ref 4.0–10.5)

## 2017-01-05 LAB — APTT: aPTT: 31 seconds (ref 24–36)

## 2017-01-05 LAB — GLUCOSE, CAPILLARY
Glucose-Capillary: 162 mg/dL — ABNORMAL HIGH (ref 65–99)
Glucose-Capillary: 188 mg/dL — ABNORMAL HIGH (ref 65–99)

## 2017-01-05 MED ORDER — CARVEDILOL 3.125 MG PO TABS: 3.1250 mg | ORAL_TABLET | Freq: Two times a day (BID) | ORAL | 0 refills | Status: DC

## 2017-01-05 MED ORDER — RESOURCE THICKENUP CLEAR PO POWD
Freq: Once | ORAL | Status: DC
  Filled 2017-01-05 (×2): qty 125

## 2017-01-05 NOTE — Progress Notes (Signed)
qPhysical Therapy Treatment Patient Details Name: Christian Garner MRN: [id number] DOB: Mar 03, 1946 Today's Date: 01/05/2017    History of Present Illness Patient is a 71 yo male admitted 12/26/16 with resp failure and intubated.  Patient with CHF exacerbation, and AKI on CRRT 3/17-3/21.  Extubated 3/22.    PMH:  CHF, EF 30-35%, CAD with mult stents, DM, LE edema/cellulitis    PT Comments    Pt with noted severe cognitive deficits in addition to increased falls risk due to R sided weakness and impaired balance. Pt with severe R sided neglect and scissored gait pattern with drifting to the Right. Pt with 2 episodes of LOB requiring maxA to prevent fall. Pt unable to process or comprehend stair negotiation with RW despite max directional verbal cues and demonstration. Pt with no awareness of deficits and has decreased safety awareness. Pt is UNSAFE to return home at this time. Pt to benefit from aggressive rehab course with CIR to achieve safe supervision level of function for safe transition home with spouse.  Follow Up Recommendations  CIR     Equipment Recommendations  Rolling walker with 5" wheels;3in1 (PT)    Recommendations for Other Services       Precautions / Restrictions Precautions Precautions: Fall Precaution Comments: impulsive Restrictions Weight Bearing Restrictions: No    Mobility  Bed Mobility               General bed mobility comments: OOB in chair  Transfers Overall transfer level: Needs assistance Equipment used: Rolling walker (2 wheeled) Transfers: Sit to/from Stand Sit to Stand: Min assist         General transfer comment: max v/c's for safe hand placement, decrease pace. pt reaching for sink to hold onto, unaware of cords. pt was stepping on tele cord and did not feel it below feet or recognicize this to be an obstacle  Ambulation/Gait Ambulation/Gait assistance: Mod assist;+2 physical assistance;+2 safety/equipment Ambulation Distance (Feet):  150 Feet Assistive device: Rolling walker (2 wheeled) Gait Pattern/deviations: Step-through pattern;Decreased stride length;Decreased step length - right;Scissoring;Staggering right;Narrow base of support;Trunk flexed Gait velocity: variable, sometimes slow sometimes impulsively fast Gait velocity interpretation: Below normal speed for age/gender General Gait Details: pt with significant R sided neglect brushing walker and R UE all along RN counter and wall despite PT standing on R side. pt ran into every obstacle on the R side. Pt unable able to hold head up stating "Its hard to do when you're this tall" pt with an episode of LOB when turning head to the L to look for his room and required maxA to prevent fall. Pt was not concerned.   Stairs Stairs: Yes   Stair Management: Two rails;Step to pattern;Forwards Number of Stairs: 2 General stair comments: pt used bilat HRs despite vc's to try without to mimic home set up. Pt reports he doesn't have handrails but unable to complete stair negotiation without handrails. Attempted multiple times to educate pt on how to ascend with RW as pt reports his 2 steps are 30 in deep however pt with poor carry over. Pt with LOB on stairs requiring maxA to prevent fall.  Wheelchair Mobility    Modified Rankin (Stroke Patients Only)       Balance Overall balance assessment: Needs assistance Sitting-balance support: Feet supported;No upper extremity supported Sitting balance-Leahy Scale: Good     Standing balance support: Single extremity supported Standing balance-Leahy Scale: Poor Standing balance comment: requires UE support and physical support  Cognition Arousal/Alertness: Awake/alert Behavior During Therapy: Impulsive Overall Cognitive Status: Impaired/Different from baseline Area of Impairment: Attention;Memory;Safety/judgement;Awareness;Problem solving                   Current Attention Level:  Selective Memory: Decreased short-term memory   Safety/Judgement: Decreased awareness of safety;Decreased awareness of deficits Awareness: Emergent Problem Solving: Slow processing;Difficulty sequencing General Comments: pt with noted R sided neglect, ran into every obstacle on the R and then moved RW around obstacle. Pt reports "I'll be fine at home" despite 2 episodes of LOB requiring maxA to prevent fall. pt unable to comprehend stair negotiation with walker despite maximal verbal cues and demonstration      Exercises      General Comments        Pertinent Vitals/Pain Pain Assessment: No/denies pain    Home Living                      Prior Function            PT Goals (current goals can now be found in the care plan section) Acute Rehab PT Goals Patient Stated Goal: go home ASAP Progress towards PT goals: Progressing toward goals    Frequency    Min 3X/week      PT Plan Discharge plan needs to be updated    Co-evaluation             End of Session Equipment Utilized During Treatment: Gait belt Activity Tolerance: Patient tolerated treatment well Patient left: in chair;with call bell/phone within reach;with chair alarm set Nurse Communication: Mobility status PT Visit Diagnosis: Unsteadiness on feet (R26.81);Other abnormalities of gait and mobility (R26.89);Muscle weakness (generalized) (M62.81)     Time: 8563-1497 PT Time Calculation (min) (ACUTE ONLY): 30 min  Charges:  $Gait Training: 23-37 mins                    G Codes:      Kittie Plater, PT, DPT Pager #: [phone] Office #: [phone]    [place] 01/05/2017, 11:45 AM

## 2017-01-05 NOTE — Care Management Note (Signed)
Case Management Note  Patient Details  Name: Christian Garner MRN: [id number] Date of Birth: 03-04-1946  Subjective/Objective:  Pt presented for Respiratory Failure-Intubated and Extubate. Pt is refusing SNF placement at this time and wants to return home.                    Action/Plan: CM did try to reach out to wife in regards to disposition needs. Telephone numbers not correct. Pt did state to call the daughter Christian Garner and received voice mail. No vm left at this time. Per pt wife is on her way to hospital. Pt did make CM aware that he need RW- wants to use [facility] for DME refuses 3n1. CM did make referral and RW to be delivered to room. CM will await wife to pick up pt before referral for agency of choice completed.   Expected Discharge Date:                  Expected Discharge Plan:  [place]  In-House Referral:  Clinical Social Work  Discharge planning Services  CM Consult  Post Acute Care Choice:  Durable Medical Equipment, Home Health Choice offered to:  Patient, Spouse  DME Arranged:  [place] [facility] (Pt refused 3n1) DME Agency:  [place]:  RN, PT, OT, Nurse's Aide, Social Work, Theme park manager Therapy, Refused SNF [place] Agency:     Status of Service:  In process, will continue to follow  If discussed at Long Length of Stay Meetings, dates discussed:    Additional Comments:  Bethena Roys, RN 01/05/2017, 11:14 AM

## 2017-01-05 NOTE — Discharge Summary (Signed)
[facility]  Physician Discharge Summary   Patient ID: Christian Garner MRN: [id number] DOB/AGE: 01/09/1946 71 y.o.  Admit date: 12/26/2016 Discharge date: 01/05/2017  PCP: Bertram Millard A, MD  DISCHARGE DIAGNOSES:  Active Problems:   Respiratory failure ([place])   AKI (acute kidney injury) ([place])   Endotracheal tube present   Pleural effusion   RECOMMENDATIONS FOR OUTPATIENT FOLLOW UP: 1. Patient and wife both declined placement into nursing facility for rehabilitation despite recommendations from multiple providers. Home health has been ordered. 2. Patient instructed to follow-up with cardiology in 1 week to determine reinitiation of diuretics and for blood work.  DISCHARGE CONDITION: fair  Diet recommendation: Dysphagia 3 diet with nectar thick liquids  Filed Weights   01/03/17 0236 01/04/17 0142 01/05/17 0421  Weight: 116.9 kg (257 lb 12.8 oz) 108 kg (238 lb 1.6 oz) 108.9 kg (240 lb 1.3 oz)    INITIAL HISTORY: 71 y/o man with PMH of CHF with EF of ~30%, CAD with multiple stents, most recently 6/17 (DES), DM2 on metformin and januvia, LE edema and cellulitis currently on bactrim.He was recently hospitalized at [facility] for CHF exacerbation and LE cellulitis, discharged on March 1st. He was sent home on lasix, metolazone (weekly), bactrim, lisinopril, carvedilol and metformin. He continued to have significant orthopnea after discharge. He went to the [facility] ED on 12/25/16 with worsening shortness of breath and was found to have an SpO2 in the low to mid 80s on room air as well as a potassium of 8.5, Cr of 3.7 increased from 1.5 at discharge and a right sided pleural effusion. His ABG was 7.15/54 and he was started on bipap and a bicarb gtt. He was also given albuterol, calcium, insulin and glucose and kayexalate for his hyperkalemia. His shortness of breath improved on BiPap and he was transferred to [facility] for further care under PCCM service.  However, after transfer, his clinical status declined and ultimately required mechanical ventilation as well as CRRT. Patient was stabilized. Extubated. And subsequently transferred to the floor.  Consultants: Nephrology. Pulmonology.  ANTIBIOTICS: Vanc 12/26/16 -->3/19 Cefepime 12/26/16 --->off 3/19 roc>>stop 24th  SIGNIFICANT EVENTS: CRRT 3/17 3/21 left thora 2000 cc 3/22 extubated  LINES/TUBES: ETT 3/17>>>3/22 L IJ trialysis catheter 3/17>>>3/25  Procedures:   Transthoracic echocardiogram Study Conclusions  - Left ventricle: The cavity size was moderately dilated. Wall thickness was increased in a pattern of moderate LVH. Systolic function was moderately to severely reduced. The estimated ejection fraction was in the range of 30% to 35%. Moderate hypokinesis of the apicalanterolateral and apical myocardium. Features are consistent with a pseudonormal left ventricular filling pattern, with concomitant abnormal relaxation and increased filling pressure (grade 2 diastolic dysfunction). - Aortic valve: Mildly calcified annulus. - Mitral valve: There was mild to moderate regurgitation. - Right ventricle: The cavity size was mildly dilated. Systolic function was mildly reduced. - Right atrium: The atrium was moderately dilated. - Pericardium, extracardiac: A small pericardial effusion was identified. There was no evidence of hemodynamic compromise.   HOSPITAL COURSE:   Acute hypoxemic respiratory failure This was thought to be secondary to volume overload/CHF. Patient was initially intubated and in the intensive care unit. He underwent thoracentesis on 3/22. No growth on pleural fluid cultures. Subsequently extubated. Now saturating normal with ambulation.  Acute kidney injury Suspected to be hemodynamically mediated from CHF exacerbation, ACE inhibitor, diuretic therapy, Bactrim use. Seen by nephrology. Underwent CRRT 3/17-3/21. Dialysis catheter was  subsequently removed. Patient has good urine output. Diuretics  on hold for now. Will need repeat blood work in one week and will need to determine reinitiation of diuretics at that point in time.  Acute on chronic systolic and diastolic heart failure -Echo EF 24-58%, grade 2 diastolic dysfunction -Has diuresed well with Lasix, improved with CRRT. Nephrology recommends keeping him off of Lasix for now. He will need close monitoring in the outpatient setting. His cardiologist is Dr. Tia Alert, Dr. Jimmie Molly. He will need to see his cardiologist next week. His wife has made an appointment for him. Home health has been ordered. Patient and wife both are refusing placement to skilled nursing facility for rehabilitation despite being told by multiple providers. He started to be at increased risk of falling. He appears to have some form of cognitive impairment, although no focal neurological deficits are present.  -Continue to hold ACE inhibitor due to renal dysfunction. Carvedilol was reinitiated.   Lower extremity cellulitis -Patient was given vancomycin and cefepime and then Rocephin. He's completed course of antibiotics.   CAD -Continue plavix. Stable  Diabetes type 2 -SSI. Monitor CBGs.  Hematuria -Urology consulted 3/17, likely related to prosthetic bleeding. Flush Foley when necessary for clots. No further recommendations. Foley removed. No further episodes of hematuria. He is making urine.  Overall improved. Medically stable for discharge. Patient and wife both decline placement to skilled nursing facility for short-term rehabilitation. Home health has been ordered.    PERTINENT LABS:  The results of significant diagnostics from this hospitalization (including imaging, microbiology, ancillary and laboratory) are listed below for reference.    Microbiology: Recent Results (from the past 240 hour(s))  Culture, Urine     Status: None   Collection Time: 12/28/16 10:34 AM  Result  Value Ref Range Status   Specimen Description URINE, CATHETERIZED  Final   Special Requests Normal  Final   Culture NO GROWTH  Final   Report Status 12/29/2016 FINAL  Final  Culture, respiratory (NON-Expectorated)     Status: None   Collection Time: 12/28/16 11:10 AM  Result Value Ref Range Status   Specimen Description TRACHEAL ASPIRATE  Final   Special Requests Normal  Final   Gram Stain   Final    FEW WBC PRESENT,BOTH PMN AND MONONUCLEAR NO ORGANISMS SEEN    Culture NO GROWTH 2 DAYS  Final   Report Status 12/30/2016 FINAL  Final  Culture, group A strep     Status: None   Collection Time: 12/28/16  2:52 PM  Result Value Ref Range Status   Specimen Description THROAT  Final   Special Requests NONE  Final   Culture NO GROUP A STREP (S.PYOGENES) ISOLATED  Final   Report Status 12/30/2016 FINAL  Final  Gram stain     Status: None   Collection Time: 12/30/16  5:11 PM  Result Value Ref Range Status   Specimen Description FLUID RIGHT PLEURAL  Final   Special Requests NONE  Final   Gram Stain   Final    ABUNDANT WBC PRESENT,BOTH PMN AND MONONUCLEAR NO ORGANISMS SEEN    Report Status 12/30/2016 FINAL  Final  Culture, body fluid-bottle     Status: None   Collection Time: 12/30/16  5:11 PM  Result Value Ref Range Status   Specimen Description FLUID RIGHT PLEURAL  Final   Special Requests BOTTLES DRAWN AEROBIC AND ANAEROBIC 10CC  Final   Culture NO GROWTH 5 DAYS  Final   Report Status 01/04/2017 FINAL  Final     Labs: Basic Metabolic Panel:  Recent Labs Lab 12/31/16 1546 01/01/17 0220 01/01/17 0232 01/01/17 0854 01/02/17 0320 01/03/17 0300 01/04/17 0341 01/05/17 0535  NA 139  --  140 141 143  143 134* 135 135  K 3.8  --  3.7 3.8 4.5  4.5 4.1 3.9 4.1  CL 100*  --  97* 100* 101  102 94* 94* 95*  CO2 27  --  30 30 31  30 29 29 27   GLUCOSE 168*  --  159* 164* 141*  142* 162* 156* 160*  BUN 60*  --  60* 64* 61*  62* 55* 45* 44*  CREATININE 1.86*  --  1.67* 1.68*  1.44*  1.41* 1.28* 1.26* 1.29*  CALCIUM 8.8*  --  9.3 9.3 9.2  9.2 9.0 9.0 9.0  MG  --  2.3  --  2.5* 2.6* 2.3 2.3  --   PHOS 3.8  --  3.1  --  4.5  4.4 3.2 3.4  --    Liver Function Tests:  Recent Labs Lab 12/30/16 1730  12/31/16 0224 12/31/16 1546 01/01/17 0232 01/02/17 0320 01/03/17 0300 01/04/17 0341  AST  --   --  29  --   --   --   --   --   ALT  --   --  15*  --   --   --   --   --   ALKPHOS  --   --  55  --   --   --   --   --   BILITOT  --   --  0.4  --   --   --   --   --   PROT 5.8*  --  5.8*  --   --   --   --   --   ALBUMIN  --   < > 2.2* 2.6* 2.8* 2.4* 2.8* 2.9*  < > = values in this interval not displayed.  CBC:  Recent Labs Lab 01/01/17 0220 01/02/17 0320 01/03/17 0300 01/04/17 0341 01/05/17 0535  WBC 10.6* 10.6* 11.1* 10.7* 10.3  NEUTROABS  --  8.2*  --   --   --   HGB 11.2* 10.9* 11.5* 11.8* 12.0*  HCT 35.5* 35.9* 36.9* 38.4* 38.0*  MCV 94.7 97.0 94.9 94.8 94.5  PLT 201 212 237 243 276   Cardiac Enzymes:  Recent Labs Lab 01/01/17 0924  TROPONINI 0.06*   BNP: BNP (last 3 results)  Recent Labs  12/26/16 0716 12/29/16 0355  BNP 412.4* 236.2*    CBG:  Recent Labs Lab 01/04/17 1113 01/04/17 1631 01/04/17 2126 01/05/17 0738 01/05/17 1119  GLUCAP 245* 173* 172* 162* 188*     IMAGING STUDIES US Renal  Result Date: 12/26/2016 CLINICAL DATA:  71 year old male with acute kidney injury and gross hematuria EXAM: RENAL / URINARY TRACT ULTRASOUND COMPLETE COMPARISON:  None. FINDINGS: Right Kidney: Length: 14.2 cm. Echogenicity within normal limits. No mass or hydronephrosis visualized. Left Kidney: Length: 13.5 cm. Echogenicity within normal limits. No mass or hydronephrosis visualized. Bladder: The bladder is decompressed.  The patient has a Foley catheter. IMPRESSION: 1. No evidence of hydronephrosis or other acute renal abnormality. 2. The bladder is decompressed. Electronically Signed   By: Jacqulynn Cadet M.D.   On: 12/26/2016  10:55   Dg Chest Port 1 View  Result Date: 01/02/2017 CLINICAL DATA:  Cardiomegaly EXAM: PORTABLE CHEST 1 VIEW COMPARISON:  January 01, 2017 FINDINGS: Central catheter tip is at the junction of the left innominate vein and  superior vena cava. No pneumothorax. There is cardiomegaly with pulmonary vascular within normal limits. No edema or consolidation. No bone lesions. IMPRESSION: Stable cardiomegaly. Central catheter as described. No pneumothorax. No edema or consolidation. Electronically Signed   By: Lowella Grip III M.D.   On: 01/02/2017 09:03   Dg Chest Port 1 View  Result Date: 01/01/2017 CLINICAL DATA:  Extubated yesterday. EXAM: PORTABLE CHEST 1 VIEW COMPARISON:  Yesterday. FINDINGS: The endotracheal tube and nasogastric tube have been removed. Left jugular catheter tip in the superior vena cava. Stable enlarged cardiac silhouette and mild right basilar airspace opacity. Thoracic spine degenerative changes. IMPRESSION: Stable cardiomegaly and mild right basilar atelectasis. Electronically Signed   By: Claudie Revering M.D.   On: 01/01/2017 08:08   Dg Chest Port 1 View  Result Date: 12/31/2016 CLINICAL DATA:  Respiratory failure EXAM: PORTABLE CHEST 1 VIEW COMPARISON:  12/30/2016 FINDINGS: Endotracheal tube, nasogastric catheter and left jugular dialysis catheter are again noted and stable. Cardiac shadow is enlarged. The lungs are well aerated bilaterally. Some mild right basilar atelectasis is noted recurrent from the prior exam. Left lung remains clear. IMPRESSION: Mild right basilar atelectasis. Tubes and lines as described. Electronically Signed   By: Inez Catalina M.D.   On: 12/31/2016 07:18   Dg Chest Port 1 View  Result Date: 12/30/2016 CLINICAL DATA:  Post thoracentesis EXAM: PORTABLE CHEST 1 VIEW COMPARISON:  12/30/2016 FINDINGS: Cardiomegaly again noted. Significant improved aeration in right lung. The right pleural effusion has resolved. There is no pneumothorax. Stable endotracheal  and NG tube position. No segmental infiltrate or pulmonary edema. Left IJ central line is unchanged in position. IMPRESSION: Significant improved aeration in right lung. The right pleural effusion has resolved. There is no pneumothorax. Stable endotracheal and NG tube position. No segmental infiltrate or pulmonary edema. Electronically Signed   By: Lahoma Crocker M.D.   On: 12/30/2016 18:17   Dg Chest Port 1 View  Result Date: 12/30/2016 CLINICAL DATA:  Ventilator EXAM: PORTABLE CHEST 1 VIEW COMPARISON:  12/29/2016 FINDINGS: Support devices are stable. Continued diffuse right lung airspace disease with moderate right pleural effusion. No confluent opacity on the left. Heart is borderline in size. IMPRESSION: Stable diffuse right lung airspace disease and moderate right effusion. Support devices stable. Electronically Signed   By: Rolm Baptise M.D.   On: 12/30/2016 08:43   Dg Chest Port 1 View  Result Date: 12/29/2016 CLINICAL DATA:  71 year old male with respiratory failure. Subsequent encounter. EXAM: PORTABLE CHEST 1 VIEW COMPARISON:  12/28/2016. FINDINGS: Endotracheal tube tip 2.9 cm above the carina. Left central line tip projects at the level of the proximal superior vena cava directed slightly laterally possibly impressing upon lateral wall of the superior vena cava without change. Nasogastric tube courses below the diaphragm. Tip is not included on the present exam. Asymmetric airspace disease greater on right with right-sided pleural effusion. This may represent result of pulmonary edema. Difficult to exclude infectious infiltrate within the right lung in the proper clinical setting. Heart is difficult to adequately assess. Calcified aorta. IMPRESSION: Overall no significant change in asymmetric airspace disease greater on right with right-sided pleural effusion suggestive of pulmonary edema. Left central line tip projects at the level of the proximal superior vena cava directed slightly laterally  possibly impressing upon lateral wall of the superior vena cava without change. Electronically Signed   By: Genia Del M.D.   On: 12/29/2016 08:27   Dg Chest Port 1 View  Result Date: 12/28/2016 CLINICAL DATA:  Hypoxia EXAM: PORTABLE CHEST 1 VIEW COMPARISON:  December 27, 2016 FINDINGS: Endotracheal tube tip is 3.4 cm above the carina. Nasogastric tube tip and side port below the diaphragm. Central catheter tip is at the junction of the left innominate vein and superior vena cava. No pneumothorax. There is persistent pleural effusion on the right with consolidation in the right base region. There is atelectatic change in the left base. Left lung is otherwise clear. There is cardiomegaly with pulmonary venous hypertension. There is atherosclerotic calcification aorta. No evident adenopathy. No bone lesions. IMPRESSION: Findings suspicious for a degree of underlying congestive heart failure. Sizable pleural effusion on the right with consolidation in the right base. There may be superimposed pneumonia in the right base. There is mild atelectasis in the left base. There is aortic atherosclerosis. Tube and catheter positions as described without evident pneumothorax. Electronically Signed   By: Lowella Grip III M.D.   On: 12/28/2016 07:21   Dg Chest Port 1 View  Result Date: 12/27/2016 CLINICAL DATA:  Respiratory failure.  Intubated. EXAM: PORTABLE CHEST 1 VIEW COMPARISON:  Yesterday. FINDINGS: Endotracheal tube in satisfactory position. Left jugular catheter tip in the superior vena cava. Stable enlarged cardiac silhouette. Increased right pleural fluid. The interstitial markings remain mildly prominent. Aortic calcification. Thoracic spine degenerative changes IMPRESSION: 1. Increased right pleural fluid. 2. Stable cardiomegaly and mild interstitial lung disease. 3. Aortic atherosclerosis. Electronically Signed   By: Claudie Revering M.D.   On: 12/27/2016 07:57   Dg Chest Port 1 View  Result Date:  12/26/2016 CLINICAL DATA:  Central line placement.  Coronary disease EXAM: PORTABLE CHEST 1 VIEW COMPARISON:  12/26/2016 FINDINGS: Endotracheal tube 8 cm from carina. Introduction of a LEFT central venous line with tip in distal SVC. No pneumothorax. Bilateral moderate effusions noted. IMPRESSION: 1. Central line Placed without pneumothorax.  Tip in the distal SVC. 2. Stable bilateral pleural effusions. Electronically Signed   By: Suzy Bouchard M.D.   On: 12/26/2016 17:52   Dg Chest Port 1 View  Result Date: 12/26/2016 CLINICAL DATA:  Intubated. EXAM: PORTABLE CHEST 1 VIEW COMPARISON:  Earlier today. FINDINGS: Endotracheal tube in satisfactory position. Stable enlarged cardiac silhouette. Increased prominence of the pulmonary vasculature and interstitial markings. Mildly increased right pleural fluid. No acute bony abnormality. IMPRESSION: Satisfactory position of the endotracheal tube. Mildly progressive changes of congestive heart failure. Electronically Signed   By: Claudie Revering M.D.   On: 12/26/2016 14:54   Dg Chest Port 1 View  Result Date: 12/26/2016 CLINICAL DATA:  Shortness of breath EXAM: PORTABLE CHEST 1 VIEW COMPARISON:  12/25/2016 FINDINGS: Cardiac enlargement. Visualized pulmonary vascularity appears normal. Moderate size right pleural effusion with atelectasis or infiltration in the right lung. Appearances are similar to prior study, lying for differences in patient positioning. Left lung is clear and expanded. Calcification of the aorta. IMPRESSION: Moderate right pleural effusion with infiltration or atelectasis in the right lung. Changes are similar to prior study, lying for differences in patient positioning. Cardiac enlargement. Electronically Signed   By: Lucienne Capers M.D.   On: 12/26/2016 03:48   Dg Abd Portable 1v  Result Date: 12/27/2016 CLINICAL DATA:  71 year old male status post nasogastric tube placement EXAM: PORTABLE ABDOMEN - 1 VIEW COMPARISON:  chest x-ray obtained  earlier today FINDINGS: A nasogastric tube is present. The tip projects over the region of the gastric antrum. The bowel gas pattern is unremarkable. Incompletely imaged airspace disease in the right lung with probable large effusion. No acute osseous  abnormality. Mild multilevel degenerative changes throughout the visualized spine. IMPRESSION: 1. The tip of the nasogastric tube projects over the gastric antrum. 2. Incompletely imaged right pleural effusion and associated airspace opacity. Electronically Signed   By: Jacqulynn Cadet M.D.   On: 12/27/2016 09:41    DISCHARGE EXAMINATION: Vitals:   01/04/17 1300 01/04/17 1752 01/04/17 2100 01/05/17 0421  BP: 129/69 (!) 99/59 119/73 123/79  Pulse: 79  88   Resp: 18 20 20 16   Temp: 98.5 F (36.9 C)  98 F (36.7 C) 98.1 F (36.7 C)  TempSrc: Oral   Oral  SpO2: 100% 94% 99%   Weight:    108.9 kg (240 lb 1.3 oz)  Height:       General appearance: alert, cooperative, appears stated age and no distress Resp: Clear to auscultation bilaterally. No wheezing. Few crackles at bases. Normal effort Cardio: regular rate and rhythm, S1, S2 normal, no murmur, click, rub or gallop GI: soft, non-tender; bowel sounds normal; no masses,  no organomegaly Extremities: extremities normal, atraumatic, no cyanosis or edema  DISPOSITION: Home with home health.  Discharge Instructions    (HEART FAILURE PATIENTS) Call MD:  Anytime you have any of the following symptoms: 1) 3 pound weight gain in 24 hours or 5 pounds in 1 week 2) shortness of breath, with or without a dry hacking cough 3) swelling in the hands, feet or stomach 4) if you have to sleep on extra pillows at night in order to breathe.    Complete by:  As directed    Call MD for:  difficulty breathing, headache or visual disturbances    Complete by:  As directed    Call MD for:  extreme fatigue    Complete by:  As directed    Call MD for:  persistant dizziness or light-headedness    Complete by:  As  directed    Call MD for:  persistant nausea and vomiting    Complete by:  As directed    Call MD for:  severe uncontrolled pain    Complete by:  As directed    Call MD for:  temperature >100.4    Complete by:  As directed    Diet Carb Modified    Complete by:  As directed    Discharge instructions    Complete by:  As directed    Please be sure to follow-up with your cardiologist to discuss resuming your diuretics. You will also need to undergo blood work to check your kidney function. Please call your primary care physician if you develop new symptoms or seek attention by calling [phone]. No changes made to medications. Home health has been ordered.  You were cared for by a hospitalist during your hospital stay. If you have any questions about your discharge medications or the care you received while you were in the hospital after you are discharged, you can call the unit and asked to speak with the hospitalist on call if the hospitalist that took care of you is not available. Once you are discharged, your primary care physician will handle any further medical issues. Please note that NO REFILLS for any discharge medications will be authorized once you are discharged, as it is imperative that you return to your primary care physician (or establish a relationship with a primary care physician if you do not have one) for your aftercare needs so that they can reassess your need for medications and monitor your lab values. If you do not  have a primary care physician, you can call [phone] for a physician referral.   Increase activity slowly    Complete by:  As directed       ALLERGIES: No Known Allergies   Discharge Medication List as of 01/05/2017 12:00 PM    CONTINUE these medications which have CHANGED   Details  carvedilol (COREG) 3.125 MG tablet Take 1 tablet (3.125 mg total) by mouth 2 (two) times daily with a meal., Starting Tue 01/05/2017, Print      CONTINUE these medications which have  NOT CHANGED   Details  acetaminophen (TYLENOL) 325 MG tablet Take 975 mg by mouth at bedtime. , Historical Med    aspirin EC 81 MG tablet Take 81 mg by mouth at bedtime., Historical Med    atorvastatin (LIPITOR) 40 MG tablet Take 40 mg by mouth at bedtime. , Historical Med    bismuth subsalicylate (PEPTO BISMOL) 262 MG/15ML suspension Take 30 mLs by mouth every 6 (six) hours as needed for indigestion., Historical Med    clopidogrel (PLAVIX) 75 MG tablet Take 75 mg by mouth daily before breakfast. , Historical Med    diphenhydrAMINE (BENADRYL) 25 MG tablet Take 25 mg by mouth at bedtime., Historical Med    ferrous sulfate 325 (65 FE) MG tablet Take 325 mg by mouth daily with breakfast., Historical Med    fluticasone (FLONASE) 50 MCG/ACT nasal spray Place 1 spray into both nostrils daily as needed for allergies or rhinitis., Historical Med    nitroGLYCERIN (NITROSTAT) 0.4 MG SL tablet Place 0.4 mg under the tongue every 5 (five) minutes as needed for chest pain., Historical Med    sitaGLIPtin (JANUVIA) 100 MG tablet Take 100 mg by mouth daily., Historical Med    vitamin B-12 (CYANOCOBALAMIN) 1000 MCG tablet Take 1,000 mcg by mouth daily., Historical Med      STOP taking these medications     furosemide (LASIX) 40 MG tablet      lisinopril (PRINIVIL,ZESTRIL) 10 MG tablet      lisinopril (PRINIVIL,ZESTRIL) 5 MG tablet      MELATONIN PO      metFORMIN (GLUCOPHAGE-XR) 750 MG 24 hr tablet      metolazone (ZAROXOLYN) 5 MG tablet         Follow-up Information    WALLMEYER,KENNETH, MD. Schedule an appointment as soon as possible for a visit in 1 week(s).   Specialty:  Cardiology Contact information: [place] [address] [phone]        Bertram Millard A, MD. Schedule an appointment as soon as possible for a visit in 1 week(s).   Specialty:  Family Medicine Why:  Please see PCP if unable to see the cardiologist in 1 week. Contact information: [place] [address] [place] Follow up.   Why:  [place].  Contact information: [address] [place] [address] [facility] up.   Specialty:  Home Health Services Why:  Physical / Occupational Therapy, Registered Nurse, Aide, Speech Therapy, Social Worker.  Contact information: [address] [place] [address] [phone]           TOTAL DISCHARGE TIME: 35 minutes  [facility] Pager [phone]  01/05/2017, 2:02 PM

## 2017-01-05 NOTE — Care Management Note (Addendum)
Case Management Note  Patient Details  Name: Christian Garner MRN: [id number] Date of Birth: 23-Dec-1945  Subjective/Objective:                    Action/Plan:   Expected Discharge Date:                  Expected Discharge Plan:  [place]  In-House Referral:  Clinical Social Work  Discharge planning Services  CM Consult  Post Acute Care Choice:  Durable Medical Equipment, Home Health Choice offered to:  Patient, Spouse  DME Arranged:  Walker rolling (Pt refused 3n1) DME Agency:  [place]:  RN, PT, OT, Nurse's Aide, Social Work, Theme park manager Therapy, Refused SNF [place] Agency:   [name].   Status of Service:  Completed.  If discussed at [place] of Stay Meetings, dates discussed:    Additional Comments: 1134 01-05-17 Jacqlyn Krauss, RN,BSN [phone] Wife arrived and pt/ wife chose [facility] for Services. CM did make referral and SOC to begin within 24-48 hours post d/c. Family to transport patient home via private vehicle. No further needs from CM at this time.   Bethena Roys, RN 01/05/2017, 11:33 AM

## 2017-01-05 NOTE — Progress Notes (Signed)
  Speech Language Pathology Treatment: Dysphagia  Patient Details Name: Christian Garner MRN: [id number] DOB: [birth year] Today's Date: 01/05/2017 Time: 0102-7253 SLP Time Calculation (min) (ACUTE ONLY): 20 min  Assessment / Plan / Recommendation Clinical Impression  FEES deferred.  Pt continues to demonstrate overt s/s of aspiration with thin liquids.  Nectar-thick liquids are safest at this time.  Voice remains hoarse. Dysphonia and dysphagia are due to laryngeal edema/lesions s/p five day intubation. Additionally, pt presents with poor insight, decreased working memory, with poor recall of instructions and difficulty sequencing basic tasks.  Discussed current issues with wife and daughter - wife with limited willingness to discuss deficits. Reinforced benefit of short rehab stay - wife wants pt home with her.  Demonstrated how to thicken liquids and rationale for thickening to nectar.  Dtr/wife verbalize understanding.  Pt's liquids can be upgraded at bedside by home health SLP when appropriate.  For D/C home today.    HPI HPI: 71 y/o presenting with hypoxic, hypercarbic respiratory failure, AKI, hyperkalemia and possible sepsis, ETT 3/17-3/22.      SLP Plan  Discharge SLP treatment due to (comment) (Pt D/C home)       Recommendations  Diet recommendations: Regular;Nectar-thick liquid Liquids provided via: Cup;No straw Medication Administration: Crushed with puree Supervision: Patient able to self feed Compensations: Minimize environmental distractions;Slow rate;Small sips/bites Postural Changes and/or Swallow Maneuvers: Seated upright 90 degrees                Follow up Recommendations: Home health SLP SLP Visit Diagnosis: Dysphagia, pharyngeal phase (R13.13) Plan: Discharge SLP treatment due to (comment) (Pt D/C home)       GO                Christian Garner 01/05/2017, 12:57 PM

## 2017-01-05 NOTE — Discharge Instructions (Signed)
Acute Kidney Injury, Adult Acute kidney injury is a sudden worsening of kidney function. The kidneys are organs that have several jobs. They filter the blood to remove waste products and extra fluid. They also maintain a healthy balance of minerals and hormones in the body, which helps control blood pressure and keep bones strong. With this condition, your kidneys do not do their jobs as well as they should. This condition ranges from mild to severe. Over time it may develop into long-lasting (chronic) kidney disease. Early detection and treatment may prevent acute kidney injury from developing into a chronic condition. What are the causes? Common causes of this condition include:  A problem with blood flow to the kidneys. This may be caused by:  Low blood pressure (hypotension) or shock.  Blood loss.  Heart and blood vessel (cardiovascular) disease.  Severe burns.  Liver disease.  Direct damage to the kidneys. This may be caused by:  Certain medicines.  A kidney infection.  Poisoning.  Being around or in contact with toxic substances.  A surgical wound.  A hard, direct hit to the kidney area.  A sudden blockage of urine flow. This may be caused by:  Cancer.  Kidney stones.  An enlarged prostate in males. What are the signs or symptoms? Symptoms of this condition may not be obvious until the condition becomes severe. Symptoms of this condition can include:  Tiredness (lethargy), or difficulty staying awake.  Nausea or vomiting.  Swelling (edema) of the face, legs, ankles, or feet.  Problems with urination, such as:  Abdominal pain, or pain along the side of your stomach (flank).  Decreased urine production.  Decrease in the force of urine flow.  Muscle twitches and cramps, especially in the legs.  Confusion or trouble concentrating.  Loss of appetite.  Fever. How is this diagnosed? This condition may be diagnosed with tests, including:  Blood  tests.  Urine tests.  Imaging tests.  A test in which a sample of tissue is removed from the kidneys to be examined under a microscope (kidney biopsy). How is this treated? Treatment for this condition depends on the cause and how severe the condition is. In mild cases, treatment may not be needed. The kidneys may heal on their own. In more severe cases, treatment will involve:  Treating the cause of the kidney injury. This may involve changing any medicines you are taking or adjusting your dosage.  Fluids. You may need specialized IV fluids to balance your body's needs.  Having a catheter placed to drain urine and prevent blockages.  Preventing problems from occurring. This may mean avoiding certain medicines or procedures that can cause further injury to the kidneys. In some cases treatment may also require:  A procedure to remove toxic wastes from the body (dialysis or continuous renal replacement therapy - CRRT).  Surgery. This may be done to repair a torn kidney, or to remove the blockage from the urinary system. Follow these instructions at home: Medicines   Take over-the-counter and prescription medicines only as told by your health care provider.  Do not take any new medicines without your health care provider's approval. Many medicines can worsen your kidney damage.  Do not take any vitamin and mineral supplements without your health care provider's approval. Many nutritional supplements can worsen your kidney damage. Lifestyle   If your health care provider prescribed changes to your diet, follow them. You may need to decrease the amount of protein you eat.  Achieve and maintain a   healthy weight. If you need help with this, ask your health care provider.  Start or continue an exercise plan. Try to exercise at least 30 minutes a day, 5 days a week.  Do not use any tobacco products, such as cigarettes, chewing tobacco, and e-cigarettes. If you need help quitting, ask  your health care provider. General instructions   Keep track of your blood pressure. Report changes in your blood pressure as told by your health care provider.  Stay up to date with immunizations. Ask your health care provider which immunizations you need.  Keep all follow-up visits as told by your health care provider. This is important. Where to find more information:  American Association of Kidney Patients: www.aakp.org  National Kidney Foundation: www.kidney.org  American Kidney Fund: www.akfinc.org  Life Options Rehabilitation Program:  www.lifeoptions.org  www.kidneyschool.org Contact a health care provider if:  Your symptoms get worse.  You develop new symptoms. Get help right away if:  You develop symptoms of worsening kidney disease, which include:  Headaches.  Abnormally dark or light skin.  Easy bruising.  Frequent hiccups.  Chest pain.  Shortness of breath.  End of menstruation in women.  Seizures.  Confusion or altered mental status.  Abdominal or back pain.  Itchiness.  You have a fever.  Your body is producing less urine.  You have pain or bleeding when you urinate. Summary  Acute kidney injury is a sudden worsening of kidney function.  Acute kidney injury can be caused by problems with blood flow to the kidneys, direct damage to the kidneys, and sudden blockage of urine flow.  Symptoms of this condition may not be obvious until it becomes severe. Symptoms may include edema, lethargy, confusion, nausea or vomiting, and problems passing urine.  This condition can usually be diagnosed with blood tests, urine tests, and imaging tests. Sometimes a kidney biopsy is done to diagnose this condition.  Treatment for this condition often involves treating the underlying cause. It is treated with fluids, medicines, dialysis, diet changes, or surgery. This information is not intended to replace advice given to you by your health care provider.  Make sure you discuss any questions you have with your health care provider. Document Released: 04/13/2011 Document Revised: 09/18/2016 Document Reviewed: 09/18/2016 Elsevier Interactive Patient Education  2017 Elsevier Inc.  

## 2017-01-05 NOTE — Progress Notes (Signed)
Patient discharged with family. Paperwork given and gone over with daughter.

## 2017-01-05 NOTE — Progress Notes (Signed)
Occupational Therapy Treatment Patient Details Name: Christian Garner MRN: [id number] DOB: Jan 08, 1946 Today's Date: 01/05/2017    History of present illness Patient is a 71 yo male admitted 12/26/16 with resp failure and intubated.  Patient with CHF exacerbation, and AKI on CRRT 3/17-3/21.  Extubated 3/22.    PMH:  CHF, EF 30-35%, CAD with mult stents, DM, LE edema/cellulitis   OT comments  Pt completed ADL task requiring mod vc for sequencing and maintaiing attention to task. Pt impulsive and demonstrates poor insight, judgement. STM and reasoning regarding his performance and his functional deficits. OT followed pt's Speech session, and pt unable to tell this therapist what the speech therapist said regarding follow up.Marland Kitchen Speech is to return to complete education with the pt's wife regarding his swallow function - pt did not remember this and stated " I should be able to swallow better in 3 days".  Co-treat with PT partial session due to pt requiring +2 assist to negotiate steps, where a fall was prevented. Pt lost his balance in the hallway and required +2 assist to prevent a fall. Pt with poor awareness/insight into the implications of his weakness.  Concerns regarding DC plan and pt's apparent cognitive deficits discussed with nsg. Do not feel pt is safe to DC home at this time and would benefit from rehab at [facility]. If pt declines SNF, he will need HHOT, [place] in addition to PT and Speech recommendations.  Follow Up Recommendations  SNF;Supervision/Assistance - 24 hour    Equipment Recommendations  3 in 1 bedside commode;Other (comment)    Recommendations for Other Services      Precautions / Restrictions Precautions Precautions: Fall Restrictions Weight Bearing Restrictions: No       Mobility Bed Mobility               General bed mobility comments: OOB in chair  Transfers Overall transfer level: Needs assistance Equipment used: Rolling walker (2 wheeled) Transfers: Sit  to/from Stand Sit to Stand: Min assist         General transfer comment: repeated vc for safety    Balance Overall balance assessment: Needs assistance   Sitting balance-Leahy Scale: Good     Standing balance support: Single extremity supported;During functional activity Standing balance-Leahy Scale: Poor    Mod A x 2 required to prevent fall in hallway                         ADL either performed or assessed with clinical judgement   ADL Overall ADL's : Needs assistance/impaired     Grooming: Set up;Supervision/safety;Cueing for sequencing   Upper Body Bathing: Set up;Supervision/ safety;Sitting;Cueing for sequencing   Lower Body Bathing: Moderate assistance;Sit to/from stand   Upper Body Dressing : Minimal assistance   Lower Body Dressing: Moderate assistance;Sit to/from stand   Toilet Transfer: Minimal assistance;RW;Ambulation;Comfort height toilet   Toileting- Clothing Manipulation and Hygiene: Minimal assistance;Sitting/lateral lean       Functional mobility during ADLs: Rolling walker;Cueing for safety;Moderate assistance (unsafe to walk without RW; scissors BLE at times;) General ADL Comments: While walking in hall with PT, turned his head to the R to find his room number and lost his balnce, requiring mod physical assist to prevent fall.      Vision   Additional Comments: ? decreased visual attention to R   Perception     Praxis      Cognition Arousal/Alertness: Awake/alert Behavior During Therapy: Impulsive Overall Cognitive  Status: Impaired/Different from baseline Area of Impairment: Attention;Memory;Safety/judgement;Awareness;Problem solving                   Current Attention Level: Selective Memory: Decreased short-term memory   Safety/Judgement: Decreased awareness of safety;Decreased awareness of deficits Awareness: Emergent Problem Solving: Slow processing;Difficulty sequencing General Comments: Required mod vc to  sequence bathing task. Attempting to bath with dry washcloth. Required cues to continue with task. Pt reqruied vc to use his RW as he is unsafe to walkl without it. Pt did not remember that ST had told him he would return to complete education with his wife. Stated he should be able to swallow in 3 days. Poor reasoning/insight. States he has been in his chair for 13 hrs adn it was 10:00am        Exercises     Shoulder Instructions       General Comments      Pertinent Vitals/ Pain       Pain Assessment: No/denies pain  Home Living                                          Prior Functioning/Environment              Frequency  Min 2X/week        Progress Toward Goals  OT Goals(current goals can now be found in the care plan section)  Progress towards OT goals: Progressing toward goals  Acute Rehab OT Goals Patient Stated Goal: to go home OT Goal Formulation: With patient Time For Goal Achievement: 01/18/17 Potential to Achieve Goals: Good ADL Goals Pt Will Perform Grooming: with supervision;standing;with caregiver independent in assisting Pt Will Perform Lower Body Bathing: with supervision;with caregiver independent in assisting;sit to/from stand Pt Will Perform Lower Body Dressing: with supervision;sit to/from stand;with caregiver independent in assisting Pt Will Transfer to Toilet: with supervision;ambulating;bedside commode Pt Will Perform Toileting - Clothing Manipulation and hygiene: with supervision;sit to/from stand;with caregiver independent in assisting  Plan Discharge plan remains appropriate    Co-evaluation                 End of Session Equipment Utilized During Treatment: Gait belt;Rolling walker  OT Visit Diagnosis: Unsteadiness on feet (R26.81);Muscle weakness (generalized) (M62.81);Other symptoms and signs involving cognitive function   Activity Tolerance Patient tolerated treatment well   Patient Left in chair;with  call bell/phone within reach;with chair alarm set   Nurse Communication Mobility status        Time: 6384-5364 OT Time Calculation (min): 44 min  Charges: OT General Charges $OT Visit: 1 Procedure OT Treatments $Self Care/Home Management : 38-52 mins  [facility], OT/L  [phone] 01/05/2017   Halana Deisher,HILLARY 01/05/2017, 10:49 AM

## 2017-01-06 NOTE — Consult Note (Signed)
           [name] CM Primary Care Navigator  01/06/2017  POLO MCMARTIN [birth year] [id number]   Wentto see patient today at the bedside to identify possible discharge needs but staffreports that he had beendischarged.  Patient was discharged home with home health services yesterday since recommendation to skilled nursing facility for rehabilitation was declined.  Primary care provider's office called (Vicky)to notify of patient's discharge and possible need for post hospital follow-up and transition of care.   Made aware to refer patient to [facility] care management if deemed appropriate for services    For questions, please contact:  Dannielle Huh, BSN, RN- [facility] Primary Care Navigator  Telephone: [phone] [place]

## 2017-01-09 DIAGNOSIS — I5043 Acute on chronic combined systolic (congestive) and diastolic (congestive) heart failure: Secondary | ICD-10-CM | POA: Diagnosis not present

## 2017-01-09 DIAGNOSIS — I2510 Atherosclerotic heart disease of native coronary artery without angina pectoris: Secondary | ICD-10-CM | POA: Diagnosis not present

## 2017-01-09 DIAGNOSIS — Z7984 Long term (current) use of oral hypoglycemic drugs: Secondary | ICD-10-CM | POA: Diagnosis not present

## 2017-01-09 DIAGNOSIS — N179 Acute kidney failure, unspecified: Secondary | ICD-10-CM | POA: Diagnosis not present

## 2017-01-09 DIAGNOSIS — E119 Type 2 diabetes mellitus without complications: Secondary | ICD-10-CM | POA: Diagnosis not present

## 2017-01-09 DIAGNOSIS — Z7902 Long term (current) use of antithrombotics/antiplatelets: Secondary | ICD-10-CM | POA: Diagnosis not present

## 2017-01-12 DIAGNOSIS — Z7984 Long term (current) use of oral hypoglycemic drugs: Secondary | ICD-10-CM | POA: Diagnosis not present

## 2017-01-12 DIAGNOSIS — E119 Type 2 diabetes mellitus without complications: Secondary | ICD-10-CM | POA: Diagnosis not present

## 2017-01-12 DIAGNOSIS — Z7902 Long term (current) use of antithrombotics/antiplatelets: Secondary | ICD-10-CM | POA: Diagnosis not present

## 2017-01-12 DIAGNOSIS — I2510 Atherosclerotic heart disease of native coronary artery without angina pectoris: Secondary | ICD-10-CM | POA: Diagnosis not present

## 2017-01-12 DIAGNOSIS — I5043 Acute on chronic combined systolic (congestive) and diastolic (congestive) heart failure: Secondary | ICD-10-CM | POA: Diagnosis not present

## 2017-01-12 DIAGNOSIS — N179 Acute kidney failure, unspecified: Secondary | ICD-10-CM | POA: Diagnosis not present

## 2017-01-13 DIAGNOSIS — I2510 Atherosclerotic heart disease of native coronary artery without angina pectoris: Secondary | ICD-10-CM | POA: Diagnosis not present

## 2017-01-13 DIAGNOSIS — Z7984 Long term (current) use of oral hypoglycemic drugs: Secondary | ICD-10-CM | POA: Diagnosis not present

## 2017-01-13 DIAGNOSIS — Z7902 Long term (current) use of antithrombotics/antiplatelets: Secondary | ICD-10-CM | POA: Diagnosis not present

## 2017-01-13 DIAGNOSIS — I5043 Acute on chronic combined systolic (congestive) and diastolic (congestive) heart failure: Secondary | ICD-10-CM | POA: Diagnosis not present

## 2017-01-13 DIAGNOSIS — N179 Acute kidney failure, unspecified: Secondary | ICD-10-CM | POA: Diagnosis not present

## 2017-01-13 DIAGNOSIS — E119 Type 2 diabetes mellitus without complications: Secondary | ICD-10-CM | POA: Diagnosis not present

## 2017-01-14 DIAGNOSIS — Z6834 Body mass index (BMI) 34.0-34.9, adult: Secondary | ICD-10-CM | POA: Diagnosis not present

## 2017-01-14 DIAGNOSIS — I255 Ischemic cardiomyopathy: Secondary | ICD-10-CM | POA: Diagnosis not present

## 2017-01-14 DIAGNOSIS — Z87448 Personal history of other diseases of urinary system: Secondary | ICD-10-CM | POA: Diagnosis not present

## 2017-01-15 DIAGNOSIS — Z7984 Long term (current) use of oral hypoglycemic drugs: Secondary | ICD-10-CM | POA: Diagnosis not present

## 2017-01-15 DIAGNOSIS — N179 Acute kidney failure, unspecified: Secondary | ICD-10-CM | POA: Diagnosis not present

## 2017-01-15 DIAGNOSIS — I5043 Acute on chronic combined systolic (congestive) and diastolic (congestive) heart failure: Secondary | ICD-10-CM | POA: Diagnosis not present

## 2017-01-15 DIAGNOSIS — I2510 Atherosclerotic heart disease of native coronary artery without angina pectoris: Secondary | ICD-10-CM | POA: Diagnosis not present

## 2017-01-15 DIAGNOSIS — Z7902 Long term (current) use of antithrombotics/antiplatelets: Secondary | ICD-10-CM | POA: Diagnosis not present

## 2017-01-15 DIAGNOSIS — E119 Type 2 diabetes mellitus without complications: Secondary | ICD-10-CM | POA: Diagnosis not present

## 2017-01-18 DIAGNOSIS — Z7902 Long term (current) use of antithrombotics/antiplatelets: Secondary | ICD-10-CM | POA: Diagnosis not present

## 2017-01-18 DIAGNOSIS — E119 Type 2 diabetes mellitus without complications: Secondary | ICD-10-CM | POA: Diagnosis not present

## 2017-01-18 DIAGNOSIS — N179 Acute kidney failure, unspecified: Secondary | ICD-10-CM | POA: Diagnosis not present

## 2017-01-18 DIAGNOSIS — I2510 Atherosclerotic heart disease of native coronary artery without angina pectoris: Secondary | ICD-10-CM | POA: Diagnosis not present

## 2017-01-18 DIAGNOSIS — Z7984 Long term (current) use of oral hypoglycemic drugs: Secondary | ICD-10-CM | POA: Diagnosis not present

## 2017-01-18 DIAGNOSIS — I5043 Acute on chronic combined systolic (congestive) and diastolic (congestive) heart failure: Secondary | ICD-10-CM | POA: Diagnosis not present

## 2017-01-20 DIAGNOSIS — N179 Acute kidney failure, unspecified: Secondary | ICD-10-CM | POA: Diagnosis not present

## 2017-01-20 DIAGNOSIS — I2510 Atherosclerotic heart disease of native coronary artery without angina pectoris: Secondary | ICD-10-CM | POA: Diagnosis not present

## 2017-01-20 DIAGNOSIS — I5043 Acute on chronic combined systolic (congestive) and diastolic (congestive) heart failure: Secondary | ICD-10-CM | POA: Diagnosis not present

## 2017-01-20 DIAGNOSIS — Z7984 Long term (current) use of oral hypoglycemic drugs: Secondary | ICD-10-CM | POA: Diagnosis not present

## 2017-01-20 DIAGNOSIS — Z7902 Long term (current) use of antithrombotics/antiplatelets: Secondary | ICD-10-CM | POA: Diagnosis not present

## 2017-01-20 DIAGNOSIS — E119 Type 2 diabetes mellitus without complications: Secondary | ICD-10-CM | POA: Diagnosis not present

## 2017-01-21 DIAGNOSIS — Z7984 Long term (current) use of oral hypoglycemic drugs: Secondary | ICD-10-CM | POA: Diagnosis not present

## 2017-01-21 DIAGNOSIS — Z7902 Long term (current) use of antithrombotics/antiplatelets: Secondary | ICD-10-CM | POA: Diagnosis not present

## 2017-01-21 DIAGNOSIS — E119 Type 2 diabetes mellitus without complications: Secondary | ICD-10-CM | POA: Diagnosis not present

## 2017-01-21 DIAGNOSIS — I5043 Acute on chronic combined systolic (congestive) and diastolic (congestive) heart failure: Secondary | ICD-10-CM | POA: Diagnosis not present

## 2017-01-21 DIAGNOSIS — I2510 Atherosclerotic heart disease of native coronary artery without angina pectoris: Secondary | ICD-10-CM | POA: Diagnosis not present

## 2017-01-21 DIAGNOSIS — N179 Acute kidney failure, unspecified: Secondary | ICD-10-CM | POA: Diagnosis not present

## 2017-01-25 DIAGNOSIS — I5043 Acute on chronic combined systolic (congestive) and diastolic (congestive) heart failure: Secondary | ICD-10-CM | POA: Diagnosis not present

## 2017-01-25 DIAGNOSIS — I2510 Atherosclerotic heart disease of native coronary artery without angina pectoris: Secondary | ICD-10-CM | POA: Diagnosis not present

## 2017-01-25 DIAGNOSIS — N179 Acute kidney failure, unspecified: Secondary | ICD-10-CM | POA: Diagnosis not present

## 2017-01-25 DIAGNOSIS — E119 Type 2 diabetes mellitus without complications: Secondary | ICD-10-CM | POA: Diagnosis not present

## 2017-01-25 DIAGNOSIS — Z7984 Long term (current) use of oral hypoglycemic drugs: Secondary | ICD-10-CM | POA: Diagnosis not present

## 2017-01-25 DIAGNOSIS — Z7902 Long term (current) use of antithrombotics/antiplatelets: Secondary | ICD-10-CM | POA: Diagnosis not present

## 2017-01-28 DIAGNOSIS — E119 Type 2 diabetes mellitus without complications: Secondary | ICD-10-CM | POA: Diagnosis not present

## 2017-01-28 DIAGNOSIS — N179 Acute kidney failure, unspecified: Secondary | ICD-10-CM | POA: Diagnosis not present

## 2017-01-28 DIAGNOSIS — Z7984 Long term (current) use of oral hypoglycemic drugs: Secondary | ICD-10-CM | POA: Diagnosis not present

## 2017-01-28 DIAGNOSIS — I2510 Atherosclerotic heart disease of native coronary artery without angina pectoris: Secondary | ICD-10-CM | POA: Diagnosis not present

## 2017-01-28 DIAGNOSIS — I5043 Acute on chronic combined systolic (congestive) and diastolic (congestive) heart failure: Secondary | ICD-10-CM | POA: Diagnosis not present

## 2017-01-28 DIAGNOSIS — Z7902 Long term (current) use of antithrombotics/antiplatelets: Secondary | ICD-10-CM | POA: Diagnosis not present

## 2017-02-02 DIAGNOSIS — Z7902 Long term (current) use of antithrombotics/antiplatelets: Secondary | ICD-10-CM | POA: Diagnosis not present

## 2017-02-02 DIAGNOSIS — N179 Acute kidney failure, unspecified: Secondary | ICD-10-CM | POA: Diagnosis not present

## 2017-02-02 DIAGNOSIS — Z7984 Long term (current) use of oral hypoglycemic drugs: Secondary | ICD-10-CM | POA: Diagnosis not present

## 2017-02-02 DIAGNOSIS — E119 Type 2 diabetes mellitus without complications: Secondary | ICD-10-CM | POA: Diagnosis not present

## 2017-02-02 DIAGNOSIS — I5043 Acute on chronic combined systolic (congestive) and diastolic (congestive) heart failure: Secondary | ICD-10-CM | POA: Diagnosis not present

## 2017-02-02 DIAGNOSIS — I2510 Atherosclerotic heart disease of native coronary artery without angina pectoris: Secondary | ICD-10-CM | POA: Diagnosis not present

## 2017-02-04 DIAGNOSIS — Z7984 Long term (current) use of oral hypoglycemic drugs: Secondary | ICD-10-CM | POA: Diagnosis not present

## 2017-02-04 DIAGNOSIS — N179 Acute kidney failure, unspecified: Secondary | ICD-10-CM | POA: Diagnosis not present

## 2017-02-04 DIAGNOSIS — I2510 Atherosclerotic heart disease of native coronary artery without angina pectoris: Secondary | ICD-10-CM | POA: Diagnosis not present

## 2017-02-04 DIAGNOSIS — E119 Type 2 diabetes mellitus without complications: Secondary | ICD-10-CM | POA: Diagnosis not present

## 2017-02-04 DIAGNOSIS — I5043 Acute on chronic combined systolic (congestive) and diastolic (congestive) heart failure: Secondary | ICD-10-CM | POA: Diagnosis not present

## 2017-02-04 DIAGNOSIS — Z7902 Long term (current) use of antithrombotics/antiplatelets: Secondary | ICD-10-CM | POA: Diagnosis not present

## 2017-02-09 DIAGNOSIS — Z7984 Long term (current) use of oral hypoglycemic drugs: Secondary | ICD-10-CM | POA: Diagnosis not present

## 2017-02-09 DIAGNOSIS — N179 Acute kidney failure, unspecified: Secondary | ICD-10-CM | POA: Diagnosis not present

## 2017-02-09 DIAGNOSIS — E119 Type 2 diabetes mellitus without complications: Secondary | ICD-10-CM | POA: Diagnosis not present

## 2017-02-09 DIAGNOSIS — I2510 Atherosclerotic heart disease of native coronary artery without angina pectoris: Secondary | ICD-10-CM | POA: Diagnosis not present

## 2017-02-09 DIAGNOSIS — I5043 Acute on chronic combined systolic (congestive) and diastolic (congestive) heart failure: Secondary | ICD-10-CM | POA: Diagnosis not present

## 2017-02-09 DIAGNOSIS — Z7902 Long term (current) use of antithrombotics/antiplatelets: Secondary | ICD-10-CM | POA: Diagnosis not present

## 2017-02-12 DIAGNOSIS — I517 Cardiomegaly: Secondary | ICD-10-CM | POA: Diagnosis not present

## 2017-02-12 DIAGNOSIS — I429 Cardiomyopathy, unspecified: Secondary | ICD-10-CM | POA: Diagnosis not present

## 2017-02-12 DIAGNOSIS — I509 Heart failure, unspecified: Secondary | ICD-10-CM | POA: Diagnosis not present

## 2017-02-12 DIAGNOSIS — Z6836 Body mass index (BMI) 36.0-36.9, adult: Secondary | ICD-10-CM | POA: Diagnosis not present

## 2017-02-16 DIAGNOSIS — Z7902 Long term (current) use of antithrombotics/antiplatelets: Secondary | ICD-10-CM | POA: Diagnosis not present

## 2017-02-16 DIAGNOSIS — I2510 Atherosclerotic heart disease of native coronary artery without angina pectoris: Secondary | ICD-10-CM | POA: Diagnosis not present

## 2017-02-16 DIAGNOSIS — E119 Type 2 diabetes mellitus without complications: Secondary | ICD-10-CM | POA: Diagnosis not present

## 2017-02-16 DIAGNOSIS — I5043 Acute on chronic combined systolic (congestive) and diastolic (congestive) heart failure: Secondary | ICD-10-CM | POA: Diagnosis not present

## 2017-02-16 DIAGNOSIS — N179 Acute kidney failure, unspecified: Secondary | ICD-10-CM | POA: Diagnosis not present

## 2017-02-16 DIAGNOSIS — Z7984 Long term (current) use of oral hypoglycemic drugs: Secondary | ICD-10-CM | POA: Diagnosis not present

## 2017-02-18 DIAGNOSIS — I5043 Acute on chronic combined systolic (congestive) and diastolic (congestive) heart failure: Secondary | ICD-10-CM | POA: Diagnosis not present

## 2017-02-18 DIAGNOSIS — Z7984 Long term (current) use of oral hypoglycemic drugs: Secondary | ICD-10-CM | POA: Diagnosis not present

## 2017-02-18 DIAGNOSIS — I2510 Atherosclerotic heart disease of native coronary artery without angina pectoris: Secondary | ICD-10-CM | POA: Diagnosis not present

## 2017-02-18 DIAGNOSIS — N179 Acute kidney failure, unspecified: Secondary | ICD-10-CM | POA: Diagnosis not present

## 2017-02-18 DIAGNOSIS — E119 Type 2 diabetes mellitus without complications: Secondary | ICD-10-CM | POA: Diagnosis not present

## 2017-02-18 DIAGNOSIS — Z7902 Long term (current) use of antithrombotics/antiplatelets: Secondary | ICD-10-CM | POA: Diagnosis not present

## 2017-02-23 DIAGNOSIS — Z7902 Long term (current) use of antithrombotics/antiplatelets: Secondary | ICD-10-CM | POA: Diagnosis not present

## 2017-02-23 DIAGNOSIS — I2510 Atherosclerotic heart disease of native coronary artery without angina pectoris: Secondary | ICD-10-CM | POA: Diagnosis not present

## 2017-02-23 DIAGNOSIS — I5043 Acute on chronic combined systolic (congestive) and diastolic (congestive) heart failure: Secondary | ICD-10-CM | POA: Diagnosis not present

## 2017-02-23 DIAGNOSIS — E119 Type 2 diabetes mellitus without complications: Secondary | ICD-10-CM | POA: Diagnosis not present

## 2017-02-23 DIAGNOSIS — Z7984 Long term (current) use of oral hypoglycemic drugs: Secondary | ICD-10-CM | POA: Diagnosis not present

## 2017-02-23 DIAGNOSIS — N179 Acute kidney failure, unspecified: Secondary | ICD-10-CM | POA: Diagnosis not present

## 2017-03-01 DIAGNOSIS — Z7902 Long term (current) use of antithrombotics/antiplatelets: Secondary | ICD-10-CM | POA: Diagnosis not present

## 2017-03-01 DIAGNOSIS — N179 Acute kidney failure, unspecified: Secondary | ICD-10-CM | POA: Diagnosis not present

## 2017-03-01 DIAGNOSIS — I2510 Atherosclerotic heart disease of native coronary artery without angina pectoris: Secondary | ICD-10-CM | POA: Diagnosis not present

## 2017-03-01 DIAGNOSIS — E119 Type 2 diabetes mellitus without complications: Secondary | ICD-10-CM | POA: Diagnosis not present

## 2017-03-01 DIAGNOSIS — Z7984 Long term (current) use of oral hypoglycemic drugs: Secondary | ICD-10-CM | POA: Diagnosis not present

## 2017-03-01 DIAGNOSIS — I5043 Acute on chronic combined systolic (congestive) and diastolic (congestive) heart failure: Secondary | ICD-10-CM | POA: Diagnosis not present

## 2017-03-05 DIAGNOSIS — E119 Type 2 diabetes mellitus without complications: Secondary | ICD-10-CM | POA: Diagnosis not present

## 2017-03-05 DIAGNOSIS — N179 Acute kidney failure, unspecified: Secondary | ICD-10-CM | POA: Diagnosis not present

## 2017-03-05 DIAGNOSIS — Z7984 Long term (current) use of oral hypoglycemic drugs: Secondary | ICD-10-CM | POA: Diagnosis not present

## 2017-03-05 DIAGNOSIS — Z7902 Long term (current) use of antithrombotics/antiplatelets: Secondary | ICD-10-CM | POA: Diagnosis not present

## 2017-03-05 DIAGNOSIS — I2510 Atherosclerotic heart disease of native coronary artery without angina pectoris: Secondary | ICD-10-CM | POA: Diagnosis not present

## 2017-03-05 DIAGNOSIS — I5043 Acute on chronic combined systolic (congestive) and diastolic (congestive) heart failure: Secondary | ICD-10-CM | POA: Diagnosis not present

## 2017-03-16 DIAGNOSIS — I509 Heart failure, unspecified: Secondary | ICD-10-CM | POA: Diagnosis not present

## 2017-03-16 DIAGNOSIS — J9621 Acute and chronic respiratory failure with hypoxia: Secondary | ICD-10-CM | POA: Diagnosis not present

## 2017-03-16 DIAGNOSIS — S60222A Contusion of left hand, initial encounter: Secondary | ICD-10-CM | POA: Diagnosis not present

## 2017-03-16 DIAGNOSIS — I429 Cardiomyopathy, unspecified: Secondary | ICD-10-CM | POA: Diagnosis not present

## 2017-03-16 DIAGNOSIS — I517 Cardiomegaly: Secondary | ICD-10-CM | POA: Diagnosis not present

## 2017-03-16 DIAGNOSIS — Z87448 Personal history of other diseases of urinary system: Secondary | ICD-10-CM | POA: Diagnosis not present

## 2017-03-19 ENCOUNTER — Inpatient Hospital Stay (HOSPITAL_COMMUNITY)
Admission: EM | Admit: 2017-03-19 | Discharge: 2017-04-02 | DRG: 291 | Disposition: A | Discharge status: Skilled Nursing Facility | Payer: Medicare Other | Source: Other Acute Inpatient Hospital | Attending: Internal Medicine | Admitting: Internal Medicine

## 2017-03-19 ENCOUNTER — Encounter (HOSPITAL_COMMUNITY): Payer: Self-pay | Admitting: Family Medicine

## 2017-03-19 DIAGNOSIS — R0682 Tachypnea, not elsewhere classified: Secondary | ICD-10-CM | POA: Diagnosis not present

## 2017-03-19 DIAGNOSIS — Z833 Family history of diabetes mellitus: Secondary | ICD-10-CM

## 2017-03-19 DIAGNOSIS — Z955 Presence of coronary angioplasty implant and graft: Secondary | ICD-10-CM

## 2017-03-19 DIAGNOSIS — R338 Other retention of urine: Secondary | ICD-10-CM | POA: Diagnosis present

## 2017-03-19 DIAGNOSIS — R278 Other lack of coordination: Secondary | ICD-10-CM | POA: Diagnosis not present

## 2017-03-19 DIAGNOSIS — Z9889 Other specified postprocedural states: Secondary | ICD-10-CM

## 2017-03-19 DIAGNOSIS — E1122 Type 2 diabetes mellitus with diabetic chronic kidney disease: Secondary | ICD-10-CM | POA: Diagnosis present

## 2017-03-19 DIAGNOSIS — I5084 End stage heart failure: Secondary | ICD-10-CM | POA: Diagnosis present

## 2017-03-19 DIAGNOSIS — E876 Hypokalemia: Secondary | ICD-10-CM | POA: Diagnosis not present

## 2017-03-19 DIAGNOSIS — E1165 Type 2 diabetes mellitus with hyperglycemia: Secondary | ICD-10-CM | POA: Diagnosis not present

## 2017-03-19 DIAGNOSIS — I4891 Unspecified atrial fibrillation: Secondary | ICD-10-CM | POA: Diagnosis not present

## 2017-03-19 DIAGNOSIS — L03116 Cellulitis of left lower limb: Secondary | ICD-10-CM | POA: Diagnosis not present

## 2017-03-19 DIAGNOSIS — L89159 Pressure ulcer of sacral region, unspecified stage: Secondary | ICD-10-CM

## 2017-03-19 DIAGNOSIS — Z7902 Long term (current) use of antithrombotics/antiplatelets: Secondary | ICD-10-CM | POA: Diagnosis not present

## 2017-03-19 DIAGNOSIS — M6281 Muscle weakness (generalized): Secondary | ICD-10-CM | POA: Diagnosis not present

## 2017-03-19 DIAGNOSIS — Z7984 Long term (current) use of oral hypoglycemic drugs: Secondary | ICD-10-CM

## 2017-03-19 DIAGNOSIS — Z66 Do not resuscitate: Secondary | ICD-10-CM | POA: Diagnosis present

## 2017-03-19 DIAGNOSIS — I2510 Atherosclerotic heart disease of native coronary artery without angina pectoris: Secondary | ICD-10-CM | POA: Diagnosis not present

## 2017-03-19 DIAGNOSIS — S60222A Contusion of left hand, initial encounter: Secondary | ICD-10-CM | POA: Diagnosis present

## 2017-03-19 DIAGNOSIS — I480 Paroxysmal atrial fibrillation: Secondary | ICD-10-CM | POA: Diagnosis present

## 2017-03-19 DIAGNOSIS — N182 Chronic kidney disease, stage 2 (mild): Secondary | ICD-10-CM | POA: Diagnosis not present

## 2017-03-19 DIAGNOSIS — J9622 Acute and chronic respiratory failure with hypercapnia: Secondary | ICD-10-CM | POA: Diagnosis present

## 2017-03-19 DIAGNOSIS — I255 Ischemic cardiomyopathy: Secondary | ICD-10-CM | POA: Diagnosis present

## 2017-03-19 DIAGNOSIS — N179 Acute kidney failure, unspecified: Secondary | ICD-10-CM | POA: Diagnosis present

## 2017-03-19 DIAGNOSIS — Z79899 Other long term (current) drug therapy: Secondary | ICD-10-CM

## 2017-03-19 DIAGNOSIS — J189 Pneumonia, unspecified organism: Secondary | ICD-10-CM | POA: Diagnosis present

## 2017-03-19 DIAGNOSIS — I509 Heart failure, unspecified: Secondary | ICD-10-CM | POA: Diagnosis not present

## 2017-03-19 DIAGNOSIS — J9602 Acute respiratory failure with hypercapnia: Secondary | ICD-10-CM | POA: Diagnosis not present

## 2017-03-19 DIAGNOSIS — Z515 Encounter for palliative care: Secondary | ICD-10-CM

## 2017-03-19 DIAGNOSIS — E875 Hyperkalemia: Secondary | ICD-10-CM | POA: Diagnosis present

## 2017-03-19 DIAGNOSIS — J9621 Acute and chronic respiratory failure with hypoxia: Secondary | ICD-10-CM | POA: Diagnosis not present

## 2017-03-19 DIAGNOSIS — W19XXXA Unspecified fall, initial encounter: Secondary | ICD-10-CM | POA: Diagnosis not present

## 2017-03-19 DIAGNOSIS — J181 Lobar pneumonia, unspecified organism: Secondary | ICD-10-CM | POA: Diagnosis not present

## 2017-03-19 DIAGNOSIS — J90 Pleural effusion, not elsewhere classified: Secondary | ICD-10-CM

## 2017-03-19 DIAGNOSIS — E871 Hypo-osmolality and hyponatremia: Secondary | ICD-10-CM | POA: Diagnosis not present

## 2017-03-19 DIAGNOSIS — J9600 Acute respiratory failure, unspecified whether with hypoxia or hypercapnia: Secondary | ICD-10-CM | POA: Diagnosis not present

## 2017-03-19 DIAGNOSIS — I5043 Acute on chronic combined systolic (congestive) and diastolic (congestive) heart failure: Secondary | ICD-10-CM | POA: Diagnosis not present

## 2017-03-19 DIAGNOSIS — Y92009 Unspecified place in unspecified non-institutional (private) residence as the place of occurrence of the external cause: Secondary | ICD-10-CM | POA: Diagnosis not present

## 2017-03-19 DIAGNOSIS — N170 Acute kidney failure with tubular necrosis: Secondary | ICD-10-CM | POA: Diagnosis not present

## 2017-03-19 DIAGNOSIS — I517 Cardiomegaly: Secondary | ICD-10-CM | POA: Diagnosis not present

## 2017-03-19 DIAGNOSIS — R2681 Unsteadiness on feet: Secondary | ICD-10-CM | POA: Diagnosis not present

## 2017-03-19 DIAGNOSIS — R0602 Shortness of breath: Secondary | ICD-10-CM | POA: Diagnosis not present

## 2017-03-19 DIAGNOSIS — Z87891 Personal history of nicotine dependence: Secondary | ICD-10-CM

## 2017-03-19 DIAGNOSIS — Z7189 Other specified counseling: Secondary | ICD-10-CM | POA: Diagnosis not present

## 2017-03-19 DIAGNOSIS — R262 Difficulty in walking, not elsewhere classified: Secondary | ICD-10-CM | POA: Diagnosis not present

## 2017-03-19 DIAGNOSIS — I5032 Chronic diastolic (congestive) heart failure: Secondary | ICD-10-CM | POA: Diagnosis not present

## 2017-03-19 DIAGNOSIS — E1129 Type 2 diabetes mellitus with other diabetic kidney complication: Secondary | ICD-10-CM | POA: Diagnosis not present

## 2017-03-19 DIAGNOSIS — Z7982 Long term (current) use of aspirin: Secondary | ICD-10-CM

## 2017-03-19 DIAGNOSIS — E1121 Type 2 diabetes mellitus with diabetic nephropathy: Secondary | ICD-10-CM | POA: Diagnosis not present

## 2017-03-19 DIAGNOSIS — J918 Pleural effusion in other conditions classified elsewhere: Secondary | ICD-10-CM | POA: Diagnosis present

## 2017-03-19 DIAGNOSIS — Z8052 Family history of malignant neoplasm of bladder: Secondary | ICD-10-CM

## 2017-03-19 DIAGNOSIS — J9610 Chronic respiratory failure, unspecified whether with hypoxia or hypercapnia: Secondary | ICD-10-CM | POA: Diagnosis not present

## 2017-03-19 DIAGNOSIS — R0902 Hypoxemia: Secondary | ICD-10-CM | POA: Diagnosis not present

## 2017-03-19 DIAGNOSIS — N183 Chronic kidney disease, stage 3 (moderate): Secondary | ICD-10-CM | POA: Diagnosis not present

## 2017-03-19 DIAGNOSIS — I340 Nonrheumatic mitral (valve) insufficiency: Secondary | ICD-10-CM | POA: Diagnosis not present

## 2017-03-19 DIAGNOSIS — J9601 Acute respiratory failure with hypoxia: Secondary | ICD-10-CM | POA: Diagnosis not present

## 2017-03-19 DIAGNOSIS — Z452 Encounter for adjustment and management of vascular access device: Secondary | ICD-10-CM | POA: Diagnosis not present

## 2017-03-19 DIAGNOSIS — N189 Chronic kidney disease, unspecified: Secondary | ICD-10-CM

## 2017-03-19 DIAGNOSIS — L8990 Pressure ulcer of unspecified site, unspecified stage: Secondary | ICD-10-CM | POA: Insufficient documentation

## 2017-03-19 DIAGNOSIS — Z9981 Dependence on supplemental oxygen: Secondary | ICD-10-CM | POA: Diagnosis not present

## 2017-03-19 DIAGNOSIS — R2243 Localized swelling, mass and lump, lower limb, bilateral: Secondary | ICD-10-CM | POA: Diagnosis not present

## 2017-03-19 DIAGNOSIS — Z808 Family history of malignant neoplasm of other organs or systems: Secondary | ICD-10-CM

## 2017-03-19 DIAGNOSIS — Z8249 Family history of ischemic heart disease and other diseases of the circulatory system: Secondary | ICD-10-CM

## 2017-03-19 MED ORDER — CARVEDILOL 3.125 MG PO TABS
3.1250 mg | ORAL_TABLET | Freq: Two times a day (BID) | ORAL | Status: DC
  Administered 2017-03-20 – 2017-03-22 (×7): 3.125 mg via ORAL
  Filled 2017-03-19 (×7): qty 1

## 2017-03-19 MED ORDER — ONDANSETRON HCL 4 MG/2ML IJ SOLN
4.0000 mg | Freq: Four times a day (QID) | INTRAMUSCULAR | Status: DC | PRN
  Administered 2017-03-31: 4 mg via INTRAVENOUS
  Filled 2017-03-19: qty 2

## 2017-03-19 MED ORDER — FUROSEMIDE 10 MG/ML IJ SOLN
80.0000 mg | Freq: Two times a day (BID) | INTRAMUSCULAR | Status: DC
  Administered 2017-03-20 (×2): 80 mg via INTRAVENOUS
  Filled 2017-03-19 (×2): qty 8

## 2017-03-19 MED ORDER — INSULIN ASPART 100 UNIT/ML ~~LOC~~ SOLN
0.0000 [IU] | Freq: Every day | SUBCUTANEOUS | Status: DC
  Administered 2017-03-20: 2 [IU] via SUBCUTANEOUS

## 2017-03-19 MED ORDER — CLOPIDOGREL BISULFATE 75 MG PO TABS
75.0000 mg | ORAL_TABLET | Freq: Every day | ORAL | Status: DC
  Administered 2017-03-20 – 2017-03-27 (×8): 75 mg via ORAL
  Filled 2017-03-19 (×8): qty 1

## 2017-03-19 MED ORDER — ASPIRIN EC 81 MG PO TBEC
81.0000 mg | DELAYED_RELEASE_TABLET | Freq: Every day | ORAL | Status: DC
  Administered 2017-03-20 – 2017-03-27 (×9): 81 mg via ORAL
  Filled 2017-03-19 (×9): qty 1

## 2017-03-19 MED ORDER — ACETAMINOPHEN 650 MG RE SUPP: 650.0000 mg | Freq: Four times a day (QID) | RECTAL | Status: DC | PRN

## 2017-03-19 MED ORDER — ACETAMINOPHEN 325 MG PO TABS
650.0000 mg | ORAL_TABLET | Freq: Four times a day (QID) | ORAL | Status: DC | PRN
  Administered 2017-03-20 – 2017-04-02 (×6): 650 mg via ORAL
  Filled 2017-03-19 (×6): qty 2

## 2017-03-19 MED ORDER — LEVOFLOXACIN IN D5W 750 MG/150ML IV SOLN
750.0000 mg | Freq: Once | INTRAVENOUS | Status: AC
  Administered 2017-03-20: 750 mg via INTRAVENOUS
  Filled 2017-03-19: qty 150

## 2017-03-19 MED ORDER — ONDANSETRON HCL 4 MG PO TABS: 4.0000 mg | ORAL_TABLET | Freq: Four times a day (QID) | ORAL | Status: DC | PRN

## 2017-03-19 MED ORDER — POTASSIUM CHLORIDE CRYS ER 20 MEQ PO TBCR
40.0000 meq | EXTENDED_RELEASE_TABLET | Freq: Two times a day (BID) | ORAL | Status: DC
  Administered 2017-03-20: 40 meq via ORAL
  Filled 2017-03-19: qty 2

## 2017-03-19 MED ORDER — INSULIN ASPART 100 UNIT/ML ~~LOC~~ SOLN
0.0000 [IU] | Freq: Three times a day (TID) | SUBCUTANEOUS | Status: DC
  Administered 2017-03-20: 3 [IU] via SUBCUTANEOUS

## 2017-03-19 MED ORDER — ENOXAPARIN SODIUM 40 MG/0.4ML ~~LOC~~ SOLN
40.0000 mg | Freq: Every day | SUBCUTANEOUS | Status: DC
  Administered 2017-03-20 – 2017-03-22 (×3): 40 mg via SUBCUTANEOUS
  Filled 2017-03-19 (×3): qty 0.4

## 2017-03-19 MED ORDER — ATORVASTATIN CALCIUM 40 MG PO TABS
40.0000 mg | ORAL_TABLET | Freq: Every day | ORAL | Status: DC
Start: 2017-03-20 — End: 2017-04-02
  Administered 2017-03-20 – 2017-04-01 (×14): 40 mg via ORAL
  Filled 2017-03-19 (×13): qty 1
  Filled 2017-03-19: qty 2

## 2017-03-19 NOTE — Progress Notes (Addendum)
[facility] transfer discussed with Dr. Graylon Good.  Christian Garner is a 51-yo m with pmh CHF last EF 30-35% with grade 2 dFx in 12/2016, CAD, CKD stage III, DM type II; who presents with worsening shortness of breath over the last few days. Had increased Lasix from at home recently w/o relief of symptoms.   ABG showed pH of 7.26, PCO2 57, PaO2 54.  Labs revealed WBC 17, hemoglobin 12.9, sodium 139, potassium 5.5, chloride 100, CO2 25, BUN 71, creatinine 1.9(previously 1.29 on 3/27), troponin 0.05, BNP 9500, LFTs wnl.   Patient was placed on BiPAP. Chest x-ray showing large right pleural effusion and possible underlying infiltrate. Patient was given 10 mg of Lasix IV, empirically started on antibiotics of Zosyn initially, and Levaquin was supposed to be given prior to transport. Suspect acute respiratory failure  2/2 CHF exacerbation +/- sepsis to underlying infection possibly VS: Currently, reported as stable. Transfer requested for possible need of thoracentesis which is not available at their facility over the weekend. Accepted as inpatient admission to the stepdown unit.

## 2017-03-19 NOTE — Progress Notes (Addendum)
Pt arrived to unit, Elink notified of arrival and need for orders.

## 2017-03-20 ENCOUNTER — Inpatient Hospital Stay (HOSPITAL_COMMUNITY): Payer: Medicare Other

## 2017-03-20 DIAGNOSIS — L8990 Pressure ulcer of unspecified site, unspecified stage: Secondary | ICD-10-CM | POA: Insufficient documentation

## 2017-03-20 LAB — COMPREHENSIVE METABOLIC PANEL
ALBUMIN: 3.4 g/dL — AB (ref 3.5–5.0)
ALT: 26 U/L (ref 17–63)
AST: 26 U/L (ref 15–41)
Alkaline Phosphatase: 103 U/L (ref 38–126)
Anion gap: 12 (ref 5–15)
BUN: 70 mg/dL — ABNORMAL HIGH (ref 6–20)
CO2: 25 mmol/L (ref 22–32)
Calcium: 8.7 mg/dL — ABNORMAL LOW (ref 8.9–10.3)
Chloride: 98 mmol/L — ABNORMAL LOW (ref 101–111)
Creatinine, Ser: 2.07 mg/dL — ABNORMAL HIGH (ref 0.61–1.24)
GFR calc Af Amer: 35 mL/min — ABNORMAL LOW (ref >60)
GFR calc non Af Amer: 31 mL/min — ABNORMAL LOW (ref >60)
GLUCOSE: 219 mg/dL — AB (ref 65–99)
POTASSIUM: 5.3 mmol/L — AB (ref 3.5–5.1)
SODIUM: 135 mmol/L (ref 135–145)
Total Bilirubin: 0.9 mg/dL (ref 0.3–1.2)
Total Protein: 7.5 g/dL (ref 6.5–8.1)

## 2017-03-20 LAB — BASIC METABOLIC PANEL
Anion gap: 11 (ref 5–15)
BUN: 76 mg/dL — AB (ref 6–20)
CALCIUM: 8.3 mg/dL — AB (ref 8.9–10.3)
CO2: 20 mmol/L — AB (ref 22–32)
CREATININE: 1.89 mg/dL — AB (ref 0.61–1.24)
Chloride: 99 mmol/L — ABNORMAL LOW (ref 101–111)
GFR calc Af Amer: 40 mL/min — ABNORMAL LOW (ref >60)
GFR calc non Af Amer: 34 mL/min — ABNORMAL LOW (ref >60)
GLUCOSE: 239 mg/dL — AB (ref 65–99)
Potassium: 6.4 mmol/L (ref 3.5–5.1)
Sodium: 130 mmol/L — ABNORMAL LOW (ref 135–145)

## 2017-03-20 LAB — GLUCOSE, CAPILLARY
GLUCOSE-CAPILLARY: 249 mg/dL — AB (ref 65–99)
Glucose-Capillary: 230 mg/dL — ABNORMAL HIGH (ref 65–99)
Glucose-Capillary: 219 mg/dL — ABNORMAL HIGH (ref 65–99)
Glucose-Capillary: 169 mg/dL — ABNORMAL HIGH (ref 65–99)
Glucose-Capillary: 175 mg/dL — ABNORMAL HIGH (ref 65–99)

## 2017-03-20 LAB — CBC WITH DIFFERENTIAL/PLATELET
Basophils Absolute: 0 10*3/uL (ref 0.0–0.1)
Basophils Relative: 0 %
Eosinophils Absolute: 0 10*3/uL (ref 0.0–0.7)
Eosinophils Relative: 0 %
HEMATOCRIT: 43.6 % (ref 39.0–52.0)
Hemoglobin: 13 g/dL (ref 13.0–17.0)
LYMPHS PCT: 3 %
Lymphs Abs: 0.5 10*3/uL — ABNORMAL LOW (ref 0.7–4.0)
MCH: 29.4 pg (ref 26.0–34.0)
MCHC: 29.8 g/dL — ABNORMAL LOW (ref 30.0–36.0)
MCV: 98.6 fL (ref 78.0–100.0)
MONO ABS: 0.1 10*3/uL (ref 0.1–1.0)
MONOS PCT: 1 %
NEUTROS ABS: 15.8 10*3/uL — AB (ref 1.7–7.7)
Neutrophils Relative %: 96 %
Platelets: 298 10*3/uL (ref 150–400)
RBC: 4.42 MIL/uL (ref 4.22–5.81)
RDW: 17 % — AB (ref 11.5–15.5)
WBC: 16.5 10*3/uL — ABNORMAL HIGH (ref 4.0–10.5)

## 2017-03-20 LAB — TROPONIN I: TROPONIN I: 0.04 ng/mL — AB (ref <0.03)

## 2017-03-20 LAB — BODY FLUID CELL COUNT WITH DIFFERENTIAL
LYMPHS FL: 30 %
Monocyte-Macrophage-Serous Fluid: 47 % — ABNORMAL LOW (ref 50–90)
NEUTROPHIL FLUID: 23 % (ref 0–25)
Total Nucleated Cell Count, Fluid: 589 cu mm (ref 0–1000)

## 2017-03-20 LAB — URINALYSIS, ROUTINE W REFLEX MICROSCOPIC
BILIRUBIN URINE: NEGATIVE
Glucose, UA: NEGATIVE mg/dL
KETONES UR: NEGATIVE mg/dL
LEUKOCYTES UA: NEGATIVE
Nitrite: NEGATIVE
PROTEIN: NEGATIVE mg/dL
SQUAMOUS EPITHELIAL / LPF: NONE SEEN
Specific Gravity, Urine: 1.011 (ref 1.005–1.030)
pH: 5 (ref 5.0–8.0)

## 2017-03-20 LAB — GRAM STAIN

## 2017-03-20 LAB — MRSA PCR SCREENING: MRSA BY PCR: NEGATIVE

## 2017-03-20 LAB — PROCALCITONIN

## 2017-03-20 LAB — PROTEIN, TOTAL: Total Protein: 7.1 g/dL (ref 6.5–8.1)

## 2017-03-20 LAB — LACTATE DEHYDROGENASE: LDH: 258 U/L — ABNORMAL HIGH (ref 98–192)

## 2017-03-20 LAB — LACTIC ACID, PLASMA: LACTIC ACID, VENOUS: 2 mmol/L — AB (ref 0.5–1.9)

## 2017-03-20 LAB — PROTEIN, PLEURAL OR PERITONEAL FLUID: Total protein, fluid: 4 g/dL

## 2017-03-20 LAB — LACTATE DEHYDROGENASE, PLEURAL OR PERITONEAL FLUID: LD, Fluid: 91 U/L — ABNORMAL HIGH (ref 3–23)

## 2017-03-20 MED ORDER — LIDOCAINE HCL 1 % IJ SOLN
INTRAMUSCULAR | Status: AC
  Filled 2017-03-20: qty 20

## 2017-03-20 MED ORDER — INSULIN ASPART 100 UNIT/ML ~~LOC~~ SOLN
0.0000 [IU] | Freq: Every day | SUBCUTANEOUS | Status: DC
  Administered 2017-03-23 – 2017-03-25 (×2): 2 [IU] via SUBCUTANEOUS

## 2017-03-20 MED ORDER — SODIUM POLYSTYRENE SULFONATE 15 GM/60ML PO SUSP
60.0000 g | Freq: Once | ORAL | Status: AC
  Administered 2017-03-20: 60 g via ORAL
  Filled 2017-03-20: qty 240

## 2017-03-20 MED ORDER — FUROSEMIDE 10 MG/ML IJ SOLN
80.0000 mg | Freq: Three times a day (TID) | INTRAMUSCULAR | Status: DC
  Administered 2017-03-20 – 2017-03-27 (×18): 80 mg via INTRAVENOUS
  Filled 2017-03-20 (×19): qty 8

## 2017-03-20 MED ORDER — INSULIN ASPART 100 UNIT/ML ~~LOC~~ SOLN
0.0000 [IU] | Freq: Three times a day (TID) | SUBCUTANEOUS | Status: DC
  Administered 2017-03-20: 7 [IU] via SUBCUTANEOUS
  Administered 2017-03-21: 3 [IU] via SUBCUTANEOUS
  Administered 2017-03-21: 7 [IU] via SUBCUTANEOUS
  Administered 2017-03-21 – 2017-03-23 (×5): 4 [IU] via SUBCUTANEOUS
  Administered 2017-03-23: 7 [IU] via SUBCUTANEOUS
  Administered 2017-03-23 – 2017-03-24 (×2): 4 [IU] via SUBCUTANEOUS
  Administered 2017-03-24: 7 [IU] via SUBCUTANEOUS
  Administered 2017-03-24: 3 [IU] via SUBCUTANEOUS
  Administered 2017-03-25: 7 [IU] via SUBCUTANEOUS
  Administered 2017-03-25: 4 [IU] via SUBCUTANEOUS
  Administered 2017-03-26: 7 [IU] via SUBCUTANEOUS
  Administered 2017-03-26: 1 [IU] via SUBCUTANEOUS
  Administered 2017-03-26: 4 [IU] via SUBCUTANEOUS
  Administered 2017-03-27: 3 [IU] via SUBCUTANEOUS
  Administered 2017-03-27 – 2017-03-28 (×4): 4 [IU] via SUBCUTANEOUS
  Administered 2017-03-29 – 2017-03-30 (×3): 3 [IU] via SUBCUTANEOUS
  Administered 2017-03-30 (×2): 4 [IU] via SUBCUTANEOUS
  Administered 2017-03-31 (×2): 3 [IU] via SUBCUTANEOUS
  Administered 2017-03-31: 4 [IU] via SUBCUTANEOUS
  Administered 2017-04-01: 7 [IU] via SUBCUTANEOUS
  Administered 2017-04-02: 3 [IU] via SUBCUTANEOUS
  Administered 2017-04-02: 4 [IU] via SUBCUTANEOUS

## 2017-03-20 MED ORDER — LEVOFLOXACIN IN D5W 500 MG/100ML IV SOLN: 500.0000 mg | INTRAVENOUS | Status: DC

## 2017-03-20 NOTE — Progress Notes (Signed)
Dr. Loleta Books notified of LA results 2.0 and Trop 0.04. No new orders received. Monitoring closely.

## 2017-03-20 NOTE — H&P (Signed)
History and Physical  Patient Name: Christian Garner     WCB:[id number]    DOB: [birth year]    DOA: 03/19/2017 PCP: [name], MD  Patient coming from: Home --[facility]  Chief Complaint: Dyspnea      HPI: Christian Garner is a 71 y.o. male with a past medical history significant for CHF EF 30% on home O2, CAD s/p PCI last >1 year ago per patient, CKD baseline Cr 1.3 and NIDDM who presents with dyspnea.  The patient was in his usual state of health until about 3-4 days ago when he started to have worsening dyspena, worse with exertion, and worsening swelling in his legs.  His home lasix was increased without improvement, he started to get weak and dizzy with exertion and today almost passed out and couldn't breathe so his wife called EMS.  ED course: -Afebrile, heart rate 109, respirations 32, BP 152/72, pulse ox 96% on 6L by [place] -Na 139, K 5.5, Cr 1.9 (baseline 1.3), WBC 17.2K, Hgb 12.9 -INR 1.2, PTT normal -Pro-BNP slightly elevated 9500 -Troponin 0.05 -ECG showed new atrial fibrillation rate 115 -CXR 1V showed what appeared to be a large right effusion with possible consolidation, so he was given Zosyn for pneumonia, cultures not obtained that I can see -He was given furosemide  -Hospitalist at [facility] was uncomfortable with patient with pleural effusion and so transfer was requested   Patient was recently admitted to [facility] in March for AKI with hyperkalemia (Scr 3.7, K>8) in the setting of acute hypoxic respiratory failure.   -He decompensated after transfer and required intubation and CRRT.  -He also had a thoracentesis during that hospitalization which showed an transudative fluid.  -He also had cellulitis during that hospitalization and completed therapy with ceftriaxone.  -He was discharged off diuretics, but he tells me that he has been back on diuretics since then.   He did also fall a few days ago, hurt his left hand.  Seen in ER he says, x-ray done, showed no  fracture.  Had a surgery on his hand four years ago, and had hardware in that hand, which he says is out now.     ROS: Review of Systems  Constitutional: Negative for chills and fever.  Respiratory: Positive for shortness of breath. Negative for cough and sputum production.   Cardiovascular: Positive for leg swelling. Negative for chest pain and palpitations.  Musculoskeletal: Positive for joint pain (left hand).  Neurological: Positive for dizziness and weakness.  All other systems reviewed and are negative.         Past Medical History:  Diagnosis Date  . CAD (coronary artery disease)   . Cellulitis and abscess of lower extremity   . CHF (congestive heart failure) (HCC)    EF 30%  . DM (diabetes mellitus) ([place])     Past Surgical History:  Procedure Laterality Date  . CORONARY ANGIOPLASTY WITH STENT PLACEMENT    . HAND SURGERY     At [facility], around 2014    Social History: Patient lives with his wife.  The patient walks unassisted.  Former smoker.  No Known Allergies  Family history: family history includes Bladder Cancer in his sister; Bone cancer in his mother; Diabetes in his mother; Hypertension in his father and mother.  Prior to Admission medications   Medication Sig Start Date End Date Taking? Authorizing Provider  acetaminophen (TYLENOL) 325 MG tablet Take 975 mg by mouth at bedtime.  [provider]  aspirin EC 81 MG tablet Take 81 mg by mouth at bedtime.    [provider]  atorvastatin (LIPITOR) 40 MG tablet Take 40 mg by mouth at bedtime.     [provider]  bismuth subsalicylate (PEPTO BISMOL) 262 MG/15ML suspension Take 30 mLs by mouth every 6 (six) hours as needed for indigestion.    [provider]  carvedilol (COREG) 3.125 MG tablet Take 1 tablet (3.125 mg total) by mouth 2 (two) times daily with a meal. 01/05/17   Bonnielee Haff, MD  clopidogrel (PLAVIX) 75 MG tablet Take 75 mg by mouth daily before  breakfast.     [provider]  diphenhydrAMINE (BENADRYL) 25 MG tablet Take 25 mg by mouth at bedtime.    [provider]  ferrous sulfate 325 (65 FE) MG tablet Take 325 mg by mouth daily with breakfast.    [provider]  fluticasone (FLONASE) 50 MCG/ACT nasal spray Place 1 spray into both nostrils daily as needed for allergies or rhinitis.    [provider]  nitroGLYCERIN (NITROSTAT) 0.4 MG SL tablet Place 0.4 mg under the tongue every 5 (five) minutes as needed for chest pain.    [provider]  sitaGLIPtin (JANUVIA) 100 MG tablet Take 100 mg by mouth daily.    [provider]  vitamin B-12 (CYANOCOBALAMIN) 1000 MCG tablet Take 1,000 mcg by mouth daily.    [provider]       Physical Exam: BP 102/79 (BP Location: Right Arm)   Pulse (!) 123   Temp 98.4 F (36.9 C) (Oral)   Resp (!) 27   Ht 6\' 2"  (1.88 m)   Wt 121.3 kg (267 lb 6.7 oz)   SpO2 93%   BMI 34.33 kg/m  General appearance: Well-developed, obese adult male, alert and in mild distress from dyspnea, somnolent, speaks in 2-3 word phrases from dyspnea.   Eyes: Anicteric, conjunctiva pink, lids and lashes normal. PERRL.    ENT: No nasal deformity, discharge, epistaxis.  Hearing seems normal. OP dry without lesions.   Neck: No neck masses.  Trachea midline.  No thyromegaly/tenderness. Lymph: No cervical or supraclavicular lymphadenopathy. Skin: Warm and dry.  Sacral ulcer.  Scratches on legs.  Left hand bruised on dorsum and palm, swelling on dorsum is large.  No jaundice.     Cardiac: Tachycardic, irregular, nl S1-S2, no murmurs appreciated.  Capillary refill is brisk.  JVP not visible.  3+ LE edema above knees.  Radial pulses 2+ and symmetric.  DP pulses diminished, but extremities feel warm. Respiratory: Tachypneic, labored.  Appears stable on 4LNC now and saturating low 90s.  Breath sounds markedly diminished on right, normal on left, no wheezes. Abdomen:  Abdomen soft.  no TTP. No ascites, distension, hepatosplenomegaly.   MSK: No  effusions.  Deformity of hand as pictured above. No cyanosis or clubbing.   Neuro: Cranial nerves 3-12 intact. Speech is fluent.  Muscle strength appears symmetric, globally weak.    Psych: Sensorium intact and responding to questions, oriented to person, place, time and situation, attention diminished from fatigue.  Behavior appropriate.  Affect blunted.  Judgment and insight appear normal.     Labs on Admission:  I have personally reviewed following labs and imaging studies from the outsid ehospital: Follicular panel shows mild hyperkalemia, mild AKI on CKD (baseline creatinine 1.3, current creatinine 1.9, BUN elevated). Glucose normal ABG shows pH 7.26, PCO2 57, PO2 54 LFTs normal Troponin minimally elevated at  0.05 ProBNP elevated 9500 INR and PTT normal Complete blood count shows leukocytosis, no anemia or thrombocytopenia      Radiological Exams on Admission: Personally reviewed CXR report shows right effusion.   EKG: Independently reviewed. Rate 115, Afib, new.  RBBB, old.  Echocardiogram Mar 2013:  Report reviewed EF 30-35% Grade 2 DD          Assessment/Plan  1. Acute respiratory failure with hypoxia and hypercapnia:  From #2, 3, and maybe 4 below.  No significant COPD history per patient, suspect the hypercapnia is from OHS, he is more alert now.   2. Acute on chronic systolic and diastolic CHF:  EF 62%.  Pro-BNP slightly elevated. -Furosemide 80 mg IV twice a day  -K supplement -Strict I/Os, daily weights, telemetry  -Daily monitoring renal function -BiPAP as needed  3. Right pleural effusion:  Suspect this is transudative.   -Diuresis -US thoracentesis ordered  4. Community-acquired pneumonia, right lower lobe:  WBC normal and no fever. -Check lactic acid and blood culture -Levaquin IV -Obtain 2V CXR -Procalcitonin and de-escalate antibiotics promptly if able  5.  New-onset atrial fibrillation:  CHADS2Vasc 4 (age, DM, CHF, vascular disease). -Monitor on telemetry -Continue carvedilol -Consult to Cardiology  6. Acute on chronic kidney injury:  Suspect congestive. -Diuresis -Daily BMP  7. Non-insulin-dependent diabetes:  -Hold Januvia -SSI with meals  8. Hand swelling: Occurred 4 days ago.  Badly bruised now.  There is some serous drainage from the swelling on the dorsum, but I am not sure it is infected. -Will discuss with Orthopedics on non-urgent basis  9. Coronary artery disease secondary prevention: Patient unsure when last stent was. -Continue statin, aspirin, BB -Continue Plavix for now           DVT prophylaxis: Lovenox  Code Status: FULL  Family Communication: None present  Disposition Plan: Anticipate IV diuresis and monitor heart rate, consult to Cardiology and thoracentesis Consults called: Cardiology, response pending at time of writing this note Admission status: INPATIENT        Medical decision making: Patient seen at 11:30 PM on 03/19/2017.  What exists of the patient's chart was reviewed in depth and summarized above.  Clinical condition: heart rate fast, but BP good, mentation normal, respirations seem to have stabilized with supplemental O2 without BiPAP.        Edwin Dada [facility] Pager [phone]     At the time of admission, it appears that the appropriate admission status for this patient is INPATIENT. This is judged to be reasonable and necessary in order to provide the required intensity of service to ensure the patient's safety given the presenting symptoms, physical exam findings, and initial radiographic and laboratory data in the context of their chronic comorbidities.  Together, these circumstances are felt to place him at high risk for further clinical deterioration threatening life, limb, or organ.   Patient requires inpatient status due to high intensity of service,  high risk for further deterioration and high frequency of surveillance required because of this severe exacerbation of their chronic organ failure.  Factors that support inaptietn status include CHF flare with hypoxic respiraotry failure requiring BiPAP, presentation in severe resp[iratory distress RR > 30, hypoxic to 80%, breathing fast, effusion on CXR and history of systolic congestive heart failure, coronary artery disease and diabetes as well as new Atrial fibrillation.  I certify that at the point of admission it is my clinical judgment that the patient will require inpatient hospital care  spanning beyond 2 midnights from the point of admission and that early discharge would result in unnecessary risk of decompensation and readmission or threat to life, limb or bodily function.

## 2017-03-20 NOTE — Progress Notes (Signed)
Pharmacy Antibiotic Note  Christian Garner is a 71 y.o. male admitted on 03/19/2017 with pneumonia.  Pharmacy has been consulted for Levaquin dosing.  Pt with acute on chronic renal failure with Scr 1.9 per MD note. Normalized CrCl is ~35-40.   Plan: -Levaquin 500 mg IV q24h -Trend WBC, temp, renal function  -F/U infectious work-up  Height: 6\' 2"  (188 cm) Weight: 267 lb 6.7 oz (121.3 kg) IBW/kg (Calculated) : 82.2  Temp (24hrs), Avg:98.4 F (36.9 C), Min:98.4 F (36.9 C), Max:98.4 F (36.9 C)  Labs at outside hospital WBC 17.2 Scr 1.9 (baseline 1.3)  No Known Allergies   Narda Bonds 03/20/2017 1:09 AM

## 2017-03-20 NOTE — Progress Notes (Signed)
Down to radiology for thoracentesis via bed with telmetry monitoring, o2 sats BP and oxygen at 6lpm. Patient very drowsy, falling asleep frequently, but alert and oriented when talking and awake. toelrated procedure well. Coughing at times, post chest xray done. No bleeding  From site. Arrived back on unit at 1500. Gave daily lovonox  And plavix. Left hand has purple tone, open area on top oozing orangish fluid. Cleaned up and elevated. During thoracentesis procedure, patient sittingt on side of bed, BP decreased as procedure underway, lowest sbp 84. 1400 cc drained off. Sent to lab for analysis.

## 2017-03-20 NOTE — Progress Notes (Signed)
Patient arrived to unit via [place] from [place]. Patient wearing Bi-Pap upon arrival. Patient with slight deviation in communication, difficult finding words and speaking clearly. Dr. Loleta Books notifed of admission and came to see patient. Afib was noted on monitor and patient stated he has never know of Afib, new diagnosis. Patient has cellulitis on both lower extremities with blisters open and slight oozing on right leg. Left leg with older blisters, scabs on appearance on left leg. Patient left hand is s/p fall injury done at home, bruised and swollen, skin tear noted with slight serosanguineous on middle knuckle. Patient states he hurt this hand years ago, had another recent fall and reinjuried hand. All care received on hand was done in [facility] according to patient. Dr. Loleta Books allowed patient to come off Bi-Pap and Nasal Cannula applied with O2 at 4L. VSS see flowsheet.

## 2017-03-20 NOTE — Progress Notes (Signed)
[place] TEAM 1 - Stepdown/ICU TEAM  Christian Garner  GXQ:[id number] DOB: 11-15-1945 DOA: 03/19/2017 PCP: [name], MD    Brief Narrative:  71 y.o. male with a history of chronic systolic CHF (EF 14%), home O2, CAD s/p PCI, CKD baseline Cr 1.3, and DM who presented with 3-4 days of progressive dyspnea accompanied by leg swelling.  In the ED at [facility] an EKG noted atrial fibrillation w/ a HR of 115.  CXR noted a large R pleural effusion.  He was transferred to [facility].  Of note he had an admission to [facility] in March 2018 during which he required intubation as well as CRRT for acute renal failure w/ hyperkalemia and a large transudative pleural effusion.  Subjective: Sitting up in a bedside chair.  Reports ongoing dyspnea.  Denies chest pressure/substernal chest pain.  Denies nausea vomiting or abdominal pain.  Assessment & Plan:  Acute hypoxic and hypercapnic respiratory failure Appears to be primarily related to volume overload in the setting of his CHF exacerbation  Newly appreciated atrial fibrillation with RVR Rate presently reasonably controlled - given low EF if tachycardia becomes a persistent problem will need to consider amiodarone IV  Acute exacerbation of chronic systolic and diastolic congestive heart failure Ejection fraction 30% via TTE March 2018 w/ grade 2 DD - diurese aggressively and follow  Recurrent Right-sided pleural effusion Most likely related to CHF - ultrasound-guided thoracentesis on right ordered  Acute kidney injury on chronic kidney disease Creatinine 1.29 at time of discharge 01/05/17 - follow with diuresis  Recent Labs Lab 03/20/17 0030  CREATININE 2.07*    Hyperkalemia Due to acute renal injury - follow w/ diuresis   Possible Right lower lobe pneumonia Procalcitonin not consistent with acute bacterial infection - discontinue antibiotic and follow  Acute urinary retention Foley now in place - likely related to BPH -  follow  DM2 Uncontrolled - adjust treatment and follow  CAD with multiple stents DES 03/2016  Recent hand injury Reports has been evaluated and fracture ruled out - follow clinically  Hematuria Was evaluated by Urology for same during prior hospital stay   DVT prophylaxis: Lovenox Code Status: FULL CODE Family Communication: no family present at time of exam  Disposition Plan: SDU  Consultants:  None  Procedures: None  Antimicrobials:  Levaquin 6/8 >  Objective: Blood pressure 115/69, pulse (!) 107, temperature 98.3 F (36.8 C), temperature source Oral, resp. rate (!) 24, height 6\' 2"  (1.88 m), weight 121.3 kg (267 lb 6.7 oz), SpO2 97 %.  Intake/Output Summary (Last 24 hours) at 03/20/17 1002 Last data filed at 03/20/17 0032  Gross per 24 hour  Intake              350 ml  Output                0 ml  Net              350 ml   Filed Weights   03/19/17 2244  Weight: 121.3 kg (267 lb 6.7 oz)    Examination: General:  Mild respiratory distress sitting up in chair with some tachypnea Lungs: Very poor air movement bilateral bases with no wheezing and crackles appreciable diffusely Cardiovascular: Irregularly irregular and tachycardic with heart rate approximately 110 Abdomen: Obese, soft, bowel sounds positive, no rebound Extremities: 3+ bilateral lower extremity edema with weeping  CBC:  Recent Labs Lab 03/20/17 0030  WBC 16.5*  NEUTROABS 15.8*  HGB 13.0  HCT  43.6  MCV 98.6  PLT 103   Basic Metabolic Panel:  Recent Labs Lab 03/20/17 0030  NA 135  K 5.3*  CL 98*  CO2 25  GLUCOSE 219*  BUN 70*  CREATININE 2.07*  CALCIUM 8.7*   GFR: Estimated Creatinine Clearance: 45.3 mL/min (A) (by C-G formula based on SCr of 2.07 mg/dL (H)).  Liver Function Tests:  Recent Labs Lab 03/20/17 0030  AST 26  ALT 26  ALKPHOS 103  BILITOT 0.9  PROT 7.5  ALBUMIN 3.4*    Cardiac Enzymes:  Recent Labs Lab 03/20/17 0030  TROPONINI 0.04*    HbA1C: No  results found for: HGBA1C  CBG:  Recent Labs Lab 03/20/17 0129 03/20/17 0854  GLUCAP 230* 219*    Recent Results (from the past 240 hour(s))  MRSA PCR Screening     Status: None   Collection Time: 03/19/17 10:47 PM  Result Value Ref Range Status   MRSA by PCR NEGATIVE NEGATIVE Final    Comment:        The GeneXpert MRSA Assay (FDA approved for NASAL specimens only), is one component of a comprehensive MRSA colonization surveillance program. It is not intended to diagnose MRSA infection nor to guide or monitor treatment for MRSA infections.      Scheduled Meds: . aspirin EC  81 mg Oral QHS  . atorvastatin  40 mg Oral QHS  . carvedilol  3.125 mg Oral BID WC  . clopidogrel  75 mg Oral QAC breakfast  . enoxaparin (LOVENOX) injection  40 mg Subcutaneous Daily  . furosemide  80 mg Intravenous BID  . insulin aspart  0-5 Units Subcutaneous QHS  . insulin aspart  0-9 Units Subcutaneous TID WC  . potassium chloride  40 mEq Oral BID     LOS: 1 day   [name], MD [facility] Office  [phone] Pager - Text Page per Shea Evans as per below:  On-Call/Text Page:      [email or web address]      password [email or web address]  If 7PM-7AM, please contact night-coverage [email or web address] Password [email or web address] 03/20/2017, 10:02 AM

## 2017-03-20 NOTE — Plan of Care (Signed)
Problem: Skin Integrity: Goal: Risk for impaired skin integrity will decrease Outcome: Not Progressing Continues with swelling of extremities, blisters on legs  And weeping  Problem: Activity: Goal: Risk for activity intolerance will decrease Outcome: Not Progressing Very short of breath, increased o2 demand, o2 at 6lpm

## 2017-03-20 NOTE — Progress Notes (Signed)
CRITICAL VALUE ALERT  Critical Value:  Potassium level 6.4  Date & Time Notied:  06/09/208 1200 Provider Notified: 03/20/2017 1201  Orders Received/Actions taken:

## 2017-03-20 NOTE — Procedures (Signed)
PROCEDURE SUMMARY:  Successful US guided right diagnostic and therapeutic thoracentesis. Yielded 1.4 liters of amber fluid. Pt tolerated procedure well. No immediate complications.  Specimen was sent for labs. CXR ordered.  Docia Barrier PA-C 03/20/2017 2:36 PM   \

## 2017-03-20 NOTE — Progress Notes (Signed)
Foley cath inserted for acute urinary retention and aggressive IV diuretics. 21F Foley inserted under strict sterile protocal via Erlene Quan, RN and Lisa,RN from [facility].Patient tolerated procedure well, 225 ml of clear, yellow urine drained and Lasix 80mg  given to patient. Urine sent to lab for urinalysis. Will continue to monitor closely.

## 2017-03-21 LAB — BASIC METABOLIC PANEL
ANION GAP: 8 (ref 5–15)
Anion gap: 12 (ref 5–15)
BUN: 82 mg/dL — ABNORMAL HIGH (ref 6–20)
BUN: 88 mg/dL — AB (ref 6–20)
CALCIUM: 8.3 mg/dL — AB (ref 8.9–10.3)
CALCIUM: 8.3 mg/dL — AB (ref 8.9–10.3)
CO2: 29 mmol/L (ref 22–32)
CO2: 22 mmol/L (ref 22–32)
CREATININE: 1.93 mg/dL — AB (ref 0.61–1.24)
Chloride: 99 mmol/L — ABNORMAL LOW (ref 101–111)
Chloride: 100 mmol/L — ABNORMAL LOW (ref 101–111)
Creatinine, Ser: 2.06 mg/dL — ABNORMAL HIGH (ref 0.61–1.24)
GFR calc Af Amer: 36 mL/min — ABNORMAL LOW (ref >60)
GFR calc non Af Amer: 31 mL/min — ABNORMAL LOW (ref >60)
GFR calc non Af Amer: 33 mL/min — ABNORMAL LOW (ref >60)
GFR, EST AFRICAN AMERICAN: 39 mL/min — AB (ref >60)
GLUCOSE: 240 mg/dL — AB (ref 65–99)
Glucose, Bld: 168 mg/dL — ABNORMAL HIGH (ref 65–99)
Potassium: 6.6 mmol/L (ref 3.5–5.1)
Potassium: 6.2 mmol/L — ABNORMAL HIGH (ref 3.5–5.1)
Sodium: 136 mmol/L (ref 135–145)
Sodium: 134 mmol/L — ABNORMAL LOW (ref 135–145)

## 2017-03-21 LAB — GLUCOSE, CAPILLARY
Glucose-Capillary: 199 mg/dL — ABNORMAL HIGH (ref 65–99)
Glucose-Capillary: 199 mg/dL — ABNORMAL HIGH (ref 65–99)
Glucose-Capillary: 201 mg/dL — ABNORMAL HIGH (ref 65–99)
Glucose-Capillary: 154 mg/dL — ABNORMAL HIGH (ref 65–99)

## 2017-03-21 LAB — HEPATIC FUNCTION PANEL
ALT: 8 U/L — AB (ref 17–63)
AST: 65 U/L — AB (ref 15–41)
Albumin: 3 g/dL — ABNORMAL LOW (ref 3.5–5.0)
Alkaline Phosphatase: 82 U/L (ref 38–126)
BILIRUBIN TOTAL: 1.7 mg/dL — AB (ref 0.3–1.2)
Bilirubin, Direct: 0.6 mg/dL — ABNORMAL HIGH (ref 0.1–0.5)
Indirect Bilirubin: 1.1 mg/dL — ABNORMAL HIGH (ref 0.3–0.9)
Total Protein: 6.2 g/dL — ABNORMAL LOW (ref 6.5–8.1)

## 2017-03-21 MED ORDER — INSULIN GLARGINE 100 UNIT/ML ~~LOC~~ SOLN
6.0000 [IU] | Freq: Every day | SUBCUTANEOUS | Status: DC
  Administered 2017-03-21 – 2017-03-25 (×5): 6 [IU] via SUBCUTANEOUS
  Filled 2017-03-21 (×5): qty 0.06

## 2017-03-21 MED ORDER — SODIUM POLYSTYRENE SULFONATE 15 GM/60ML PO SUSP
30.0000 g | Freq: Once | ORAL | Status: AC
  Administered 2017-03-21: 30 g via ORAL
  Filled 2017-03-21: qty 120

## 2017-03-21 NOTE — Progress Notes (Signed)
[place] TEAM 1 - Stepdown/ICU TEAM  Christian Garner  PFX:[id number] DOB: 29-Oct-1945 DOA: 03/19/2017 PCP: [name], MD    Brief Narrative:  71 y.o. male with a history of chronic systolic CHF (EF 32%), home O2, CAD s/p PCI, CKD baseline Cr 1.3, and DM who presented with 3-4 days of progressive dyspnea accompanied by leg swelling.  In the ED at [facility] an EKG noted atrial fibrillation w/ a HR of 115.  CXR noted a large R pleural effusion.  He was transferred to [facility].  Of note he had an admission to [facility] in March 2018 during which he required intubation as well as CRRT for acute renal failure w/ hyperkalemia and a large transudative pleural effusion.  Subjective: The patient states his shortness of breath is somewhat improved today.  He denies fevers chills nausea vomiting or abdominal pain.  He is sitting up in a chair with his legs propped up.  Assessment & Plan:  Acute hypoxic and hypercapnic respiratory failure Appears to be primarily related to volume overload in the setting of a CHF exacerbation  Newly appreciated atrial fibrillation with RVR Rate well controlled at this time with intermittent episodes of bradycardia - continue to follow on telemetry - given low EF if tachycardia becomes a persistent problem will need to consider amiodarone IV  Acute exacerbation of chronic systolic and diastolic congestive heart failure Ejection fraction 30% via TTE March 2018 w/ grade 2 DD - diurese aggressively and follow - weight dropping but thus far net negative only about 200 cc - continue to diurese as able   [employer]   03/19/17 2244 03/21/17 0315  Weight: 121.3 kg (267 lb 6.7 oz) 119 kg (262 lb 5.6 oz)    Recurrent Right-sided pleural effusion Most likely related to CHF - ultrasound-guided thoracentesis yielding ~1.4L accomplished on 6/9 - f/u CXR in AM  Acute kidney injury on chronic kidney disease Creatinine 1.29 at time of discharge 01/05/17 - creatinine holding  steady for now but not at baseline - follow w/ ongoing attempts to diurese   Recent Labs Lab 03/20/17 0030 03/20/17 1052 03/21/17 0435  CREATININE 2.07* 1.89* 2.06*    Hyperkalemia Due to acute renal injury - last 2 specimens hemolyzed affecting the accuracy of our monitoring - given high-dose Kayexalate yesterday - recheck and follow potassium w/ another does of Kayexelate today   Possible Right lower lobe pneumonia Procalcitonin not consistent with acute bacterial infection - discontinued antibiotics 6/9   Acute urinary retention Foley in place - likely related to BPH - follow  DM2 CBG improved not at goal - adjust further today and follow  CAD with multiple stents DES 03/2016  Recent hand injury Reports has been evaluated and fracture ruled out - follow clinically  Hematuria Was evaluated by Urology for same during prior hospital stay   DVT prophylaxis: Lovenox Code Status: FULL CODE Family Communication: no family present at time of exam  Disposition Plan: SDU  Consultants:  None  Procedures: None  Antimicrobials:  Levaquin 6/8   Objective: Blood pressure 124/70, pulse (!) 48, temperature 97.5 F (36.4 C), temperature source Oral, resp. rate (!) 21, height 6\' 2"  (1.88 m), weight 119 kg (262 lb 5.6 oz), SpO2 94 %.  Intake/Output Summary (Last 24 hours) at 03/21/17 1126 Last data filed at 03/21/17 0800  Gross per 24 hour  Intake              540 ml  Output  1225 ml  Net             -685 ml   Filed Weights   03/19/17 2244 03/21/17 0315  Weight: 121.3 kg (267 lb 6.7 oz) 119 kg (262 lb 5.6 oz)    Examination: General:  Less respiratory distress today Lungs: Very poor air movement bilateral bases - no wheezing Cardiovascular: Irregularly irregular - rate controlled - no rub Abdomen: Obese, soft, bowel sounds positive, no rebound Extremities: 3+ bilateral lower extremity edema without significant change  CBC:  Recent Labs Lab  03/20/17 0030  WBC 16.5*  NEUTROABS 15.8*  HGB 13.0  HCT 43.6  MCV 98.6  PLT 175   Basic Metabolic Panel:  Recent Labs Lab 03/20/17 0030 03/20/17 1052 03/21/17 0435  NA 135 130* 136  K 5.3* 6.4* 6.6*  CL 98* 99* 99*  CO2 25 20* 29  GLUCOSE 219* 239* 168*  BUN 70* 76* 82*  CREATININE 2.07* 1.89* 2.06*  CALCIUM 8.7* 8.3* 8.3*   GFR: Estimated Creatinine Clearance: 45.1 mL/min (A) (by C-G formula based on SCr of 2.06 mg/dL (H)).  Liver Function Tests:  Recent Labs Lab 03/20/17 0030 03/20/17 1052 03/21/17 0435  AST 26  --  65*  ALT 26  --  8*  ALKPHOS 103  --  82  BILITOT 0.9  --  1.7*  PROT 7.5 7.1 6.2*  ALBUMIN 3.4*  --  3.0*    Cardiac Enzymes:  Recent Labs Lab 03/20/17 0030  TROPONINI 0.04*   CBG:  Recent Labs Lab 03/20/17 0854 03/20/17 1203 03/20/17 1606 03/20/17 2127 03/21/17 0741  GLUCAP 219* 249* 169* 175* 199*    Recent Results (from the past 240 hour(s))  MRSA PCR Screening     Status: None   Collection Time: 03/19/17 10:47 PM  Result Value Ref Range Status   MRSA by PCR NEGATIVE NEGATIVE Final    Comment:        The GeneXpert MRSA Assay (FDA approved for NASAL specimens only), is one component of a comprehensive MRSA colonization surveillance program. It is not intended to diagnose MRSA infection nor to guide or monitor treatment for MRSA infections.   Gram stain     Status: None   Collection Time: 03/20/17  2:46 PM  Result Value Ref Range Status   Specimen Description FLUID RIGHT PLEURAL  Final   Special Requests NONE  Final   Gram Stain   Final    RARE WBC PRESENT, PREDOMINANTLY MONONUCLEAR NO ORGANISMS SEEN    Report Status 03/20/2017 FINAL  Final     Scheduled Meds: . aspirin EC  81 mg Oral QHS  . atorvastatin  40 mg Oral QHS  . carvedilol  3.125 mg Oral BID WC  . clopidogrel  75 mg Oral QAC breakfast  . enoxaparin (LOVENOX) injection  40 mg Subcutaneous Daily  . furosemide  80 mg Intravenous Q8H  . insulin  aspart  0-20 Units Subcutaneous TID WC  . insulin aspart  0-5 Units Subcutaneous QHS     LOS: 2 days   [name], MD [facility] Office  [phone] Pager - Text Page per Shea Evans as per below:  On-Call/Text Page:      [email or web address]      password [email or web address]  If 7PM-7AM, please contact night-coverage [email or web address] Password [email or web address] 03/21/2017, 11:26 AM

## 2017-03-22 ENCOUNTER — Inpatient Hospital Stay (HOSPITAL_COMMUNITY): Payer: Medicare Other

## 2017-03-22 DIAGNOSIS — J9621 Acute and chronic respiratory failure with hypoxia: Secondary | ICD-10-CM

## 2017-03-22 DIAGNOSIS — J9622 Acute and chronic respiratory failure with hypercapnia: Secondary | ICD-10-CM

## 2017-03-22 DIAGNOSIS — I480 Paroxysmal atrial fibrillation: Secondary | ICD-10-CM

## 2017-03-22 DIAGNOSIS — I5043 Acute on chronic combined systolic (congestive) and diastolic (congestive) heart failure: Principal | ICD-10-CM

## 2017-03-22 LAB — CBC
HEMATOCRIT: 39.5 % (ref 39.0–52.0)
Hemoglobin: 11.8 g/dL — ABNORMAL LOW (ref 13.0–17.0)
MCH: 29.1 pg (ref 26.0–34.0)
MCHC: 29.9 g/dL — ABNORMAL LOW (ref 30.0–36.0)
MCV: 97.3 fL (ref 78.0–100.0)
Platelets: 192 10*3/uL (ref 150–400)
RBC: 4.06 MIL/uL — AB (ref 4.22–5.81)
RDW: 16.5 % — ABNORMAL HIGH (ref 11.5–15.5)
WBC: 14 10*3/uL — ABNORMAL HIGH (ref 4.0–10.5)

## 2017-03-22 LAB — BASIC METABOLIC PANEL
Anion gap: 11 (ref 5–15)
BUN: 78 mg/dL — AB (ref 6–20)
CHLORIDE: 94 mmol/L — AB (ref 101–111)
CO2: 27 mmol/L (ref 22–32)
Calcium: 8.2 mg/dL — ABNORMAL LOW (ref 8.9–10.3)
Creatinine, Ser: 1.61 mg/dL — ABNORMAL HIGH (ref 0.61–1.24)
GFR calc Af Amer: 48 mL/min — ABNORMAL LOW (ref >60)
GFR calc non Af Amer: 41 mL/min — ABNORMAL LOW (ref >60)
Glucose, Bld: 143 mg/dL — ABNORMAL HIGH (ref 65–99)
POTASSIUM: 3.8 mmol/L (ref 3.5–5.1)
SODIUM: 132 mmol/L — AB (ref 135–145)

## 2017-03-22 LAB — TSH: TSH: 2.559 u[IU]/mL (ref 0.350–4.500)

## 2017-03-22 LAB — GLUCOSE, CAPILLARY
GLUCOSE-CAPILLARY: 158 mg/dL — AB (ref 65–99)
GLUCOSE-CAPILLARY: 169 mg/dL — AB (ref 65–99)
Glucose-Capillary: 180 mg/dL — ABNORMAL HIGH (ref 65–99)
Glucose-Capillary: 200 mg/dL — ABNORMAL HIGH (ref 65–99)

## 2017-03-22 LAB — COOXEMETRY PANEL
Carboxyhemoglobin: 1.7 % — ABNORMAL HIGH (ref 0.5–1.5)
Methemoglobin: 0.8 % (ref 0.0–1.5)
O2 Saturation: 51.9 %
TOTAL HEMOGLOBIN: 11.6 g/dL — AB (ref 12.0–16.0)

## 2017-03-22 LAB — PHOSPHORUS: Phosphorus: 4.5 mg/dL (ref 2.5–4.6)

## 2017-03-22 LAB — MAGNESIUM: Magnesium: 2.6 mg/dL — ABNORMAL HIGH (ref 1.7–2.4)

## 2017-03-22 LAB — PATHOLOGIST SMEAR REVIEW: PATH REVIEW: REACTIVE

## 2017-03-22 MED ORDER — AMIODARONE HCL IN DEXTROSE 360-4.14 MG/200ML-% IV SOLN
30.0000 mg/h | INTRAVENOUS | Status: DC
  Administered 2017-03-22 – 2017-03-29 (×15): 30 mg/h via INTRAVENOUS
  Filled 2017-03-22 (×15): qty 200

## 2017-03-22 MED ORDER — SODIUM CHLORIDE 0.9% FLUSH
10.0000 mL | Freq: Two times a day (BID) | INTRAVENOUS | Status: DC
  Administered 2017-03-22 – 2017-03-25 (×6): 10 mL
  Administered 2017-03-26: 30 mL
  Administered 2017-03-27 – 2017-04-02 (×10): 10 mL

## 2017-03-22 MED ORDER — HEPARIN (PORCINE) IN NACL 100-0.45 UNIT/ML-% IJ SOLN
1800.0000 [IU]/h | INTRAMUSCULAR | Status: DC
  Administered 2017-03-22 – 2017-03-25 (×5): 1400 [IU]/h via INTRAVENOUS
  Administered 2017-03-26: 1600 [IU]/h via INTRAVENOUS
  Administered 2017-03-27 – 2017-03-28 (×2): 1800 [IU]/h via INTRAVENOUS
  Filled 2017-03-22 (×9): qty 250

## 2017-03-22 MED ORDER — MILRINONE LACTATE IN DEXTROSE 20-5 MG/100ML-% IV SOLN
0.2500 ug/kg/min | INTRAVENOUS | Status: DC
  Administered 2017-03-22 – 2017-03-25 (×7): 0.25 ug/kg/min via INTRAVENOUS
  Filled 2017-03-22 (×7): qty 100

## 2017-03-22 MED ORDER — SODIUM CHLORIDE 0.9% FLUSH: 10.0000 mL | INTRAVENOUS | Status: DC | PRN

## 2017-03-22 MED ORDER — AMIODARONE HCL IN DEXTROSE 360-4.14 MG/200ML-% IV SOLN
60.0000 mg/h | INTRAVENOUS | Status: AC
  Administered 2017-03-22 (×2): 60 mg/h via INTRAVENOUS
  Filled 2017-03-22: qty 200

## 2017-03-22 MED ORDER — AMIODARONE LOAD VIA INFUSION
150.0000 mg | Freq: Once | INTRAVENOUS | Status: AC
  Administered 2017-03-22: 150 mg via INTRAVENOUS
  Filled 2017-03-22: qty 83.34

## 2017-03-22 NOTE — Progress Notes (Signed)
[place] TEAM 1 - Stepdown/ICU TEAM  Christian Garner  VOJ:[id number] DOB: [birth year] DOA: 03/19/2017 PCP: [name], MD    Brief Narrative:  71 y.o. male with a history of chronic systolic CHF (EF 99%), home O2, CAD s/p PCI, CKD baseline Cr 1.3, and DM who presented with 3-4 days of progressive dyspnea accompanied by leg swelling.  In the ED at [facility] an EKG noted atrial fibrillation w/ a HR of 115.  CXR noted a large R pleural effusion.  He was transferred to [facility].  Of note he had an admission to [facility] in March 2018 during which he required intubation as well as CRRT for acute renal failure w/ hyperkalemia and a large transudative pleural effusion.  Subjective: The patient is sitting up in a bedside chair.  He is more alert and interactive today.  He denies chest pain or significant shortness of breath.  There is no appreciable change in his edema on exam.  He is tolerating his current diet without difficulty.  Assessment & Plan:  Acute hypoxic and hypercapnic respiratory failure Appears to be primarily related to volume overload in the setting of a CHF exacerbation + pleural effusion   Newly appreciated atrial fibrillation with RVR Rate reasonably controlled at this time - avoiding CCB  Acute exacerbation of chronic systolic and diastolic congestive heart failure Ejection fraction 30% via TTE March 2018 w/ grade 2 DD - continuing to attempt diuresis but thus far response has been poor - will ask CHF Team to assist    [facility] Weights   03/19/17 2244 03/21/17 0315 03/22/17 0333  Weight: 121.3 kg (267 lb 6.7 oz) 119 kg (262 lb 5.6 oz) 122.8 kg (270 lb 11.6 oz)    Recurrent Right-sided pleural effusion Most likely related to CHF - ultrasound-guided thoracentesis yielding ~1.4L accomplished on 6/9 - f/u CXR today suggests progressive re-accumulation   Acute kidney injury on chronic kidney disease Creatinine 1.29 at time of discharge 01/05/17 - creatinine holding steady for now  but not at baseline - cont to follow closely   Recent Labs Lab 03/20/17 0030 03/20/17 1052 03/21/17 0435 03/21/17 1117 03/22/17 0150  CREATININE 2.07* 1.89* 2.06* 1.93* 1.61*    Hyperkalemia Due to acute renal injury - now resolved s/p multiple doses of kayexalate   Possible Right lower lobe pneumonia Procalcitonin not consistent with acute bacterial infection - discontinued antibiotics 6/9   Acute urinary retention Foley in place - likely related to BPH - follow  DM2 CBG reasonably controlled at this time - follow trend   CAD with multiple stents DES 03/2016  Recent hand injury Reports has been evaluated and fracture ruled out - follow clinically - signif ecchymosis noted but w/ good radial pulse and use of hand    Hematuria Was evaluated by Urology for same during prior hospital stay   DVT prophylaxis: Lovenox Code Status: FULL CODE Family Communication: no family present at time of exam  Disposition Plan: SDU  Consultants:  CHF Team   Procedures: None  Antimicrobials:  Levaquin 6/8   Objective: Blood pressure 116/62, pulse (!) 108, temperature 97.9 F (36.6 C), temperature source Oral, resp. rate 18, height 6\' 2"  (1.88 m), weight 122.8 kg (270 lb 11.6 oz), SpO2 95 %.  Intake/Output Summary (Last 24 hours) at 03/22/17 1154 Last data filed at 03/22/17 1000  Gross per 24 hour  Intake             1860 ml  Output  1775 ml  Net               85 ml   Filed Weights   03/19/17 2244 03/21/17 0315 03/22/17 0333  Weight: 121.3 kg (267 lb 6.7 oz) 119 kg (262 lb 5.6 oz) 122.8 kg (270 lb 11.6 oz)    Examination: General:  More alert and interactive  Lungs: Distant breath sounds in all fields with no air movement in right base with no wheezing Cardiovascular: Irregularly irregular - rate controlled - no appreciable murmur Abdomen: Obese, soft, bowel sounds positive, no rebound Extremities: 3+ bilateral lower extremity edema essentially  unchanged  CBC:  Recent Labs Lab 03/20/17 0030 03/22/17 0150  WBC 16.5* 14.0*  NEUTROABS 15.8*  --   HGB 13.0 11.8*  HCT 43.6 39.5  MCV 98.6 97.3  PLT 298 675   Basic Metabolic Panel:  Recent Labs Lab 03/20/17 0030 03/20/17 1052 03/21/17 0435 03/21/17 1117 03/22/17 0150  NA 135 130* 136 134* 132*  K 5.3* 6.4* 6.6* 6.2* 3.8  CL 98* 99* 99* 100* 94*  CO2 25 20* 29 22 27   GLUCOSE 219* 239* 168* 240* 143*  BUN 70* 76* 82* 88* 78*  CREATININE 2.07* 1.89* 2.06* 1.93* 1.61*  CALCIUM 8.7* 8.3* 8.3* 8.3* 8.2*  MG  --   --   --   --  2.6*  PHOS  --   --   --   --  4.5   GFR: Estimated Creatinine Clearance: 58.6 mL/min (A) (by C-G formula based on SCr of 1.61 mg/dL (H)).  Liver Function Tests:  Recent Labs Lab 03/20/17 0030 03/20/17 1052 03/21/17 0435  AST 26  --  65*  ALT 26  --  8*  ALKPHOS 103  --  82  BILITOT 0.9  --  1.7*  PROT 7.5 7.1 6.2*  ALBUMIN 3.4*  --  3.0*    Cardiac Enzymes:  Recent Labs Lab 03/20/17 0030  TROPONINI 0.04*   CBG:  Recent Labs Lab 03/21/17 1223 03/21/17 1619 03/21/17 2116 03/22/17 0834 03/22/17 1147  GLUCAP 199* 201* 154* 158* 169*    Recent Results (from the past 240 hour(s))  MRSA PCR Screening     Status: None   Collection Time: 03/19/17 10:47 PM  Result Value Ref Range Status   MRSA by PCR NEGATIVE NEGATIVE Final    Comment:        The GeneXpert MRSA Assay (FDA approved for NASAL specimens only), is one component of a comprehensive MRSA colonization surveillance program. It is not intended to diagnose MRSA infection nor to guide or monitor treatment for MRSA infections.   Culture, blood (single)     Status: None (Preliminary result)   Collection Time: 03/20/17 12:30 AM  Result Value Ref Range Status   Specimen Description BLOOD LEFT ANTECUBITAL  Final   Special Requests   Final    BOTTLES DRAWN AEROBIC AND ANAEROBIC Blood Culture adequate volume   Culture NO GROWTH 1 DAY  Final   Report Status PENDING   Incomplete  Culture, body fluid-bottle     Status: None (Preliminary result)   Collection Time: 03/20/17  2:46 PM  Result Value Ref Range Status   Specimen Description FLUID RIGHT PLEURAL  Final   Special Requests NONE  Final   Culture NO GROWTH < 24 HOURS  Final   Report Status PENDING  Incomplete  Gram stain     Status: None   Collection Time: 03/20/17  2:46 PM  Result Value Ref Range  Status   Specimen Description FLUID RIGHT PLEURAL  Final   Special Requests NONE  Final   Gram Stain   Final    RARE WBC PRESENT, PREDOMINANTLY MONONUCLEAR NO ORGANISMS SEEN    Report Status 03/20/2017 FINAL  Final     Scheduled Meds: . aspirin EC  81 mg Oral QHS  . atorvastatin  40 mg Oral QHS  . carvedilol  3.125 mg Oral BID WC  . clopidogrel  75 mg Oral QAC breakfast  . enoxaparin (LOVENOX) injection  40 mg Subcutaneous Daily  . furosemide  80 mg Intravenous Q8H  . insulin aspart  0-20 Units Subcutaneous TID WC  . insulin aspart  0-5 Units Subcutaneous QHS  . insulin glargine  6 Units Subcutaneous Daily     LOS: 3 days   [name], MD [facility] Office  [phone] Pager - Text Page per Amion as per below:  On-Call/Text Page:      [email or web address]      password [email or web address]  If 7PM-7AM, please contact night-coverage [email or web address] Password [facility] 03/22/2017, 11:54 AM

## 2017-03-22 NOTE — Progress Notes (Signed)
ANTICOAGULATION CONSULT NOTE - Initial Consult  Pharmacy Consult for IV heparin Indication: atrial fibrillation  No Known Allergies  Patient Measurements: Height: 6\' 2"  (188 cm) Weight: 270 lb 11.6 oz (122.8 kg) IBW/kg (Calculated) : 82.2 Heparin Dosing Weight: 108 kg  Vital Signs: Temp: 97.9 F (36.6 C) (06/11 0726) Temp Source: Oral (06/11 0726) BP: 116/62 (06/11 0726) Pulse Rate: 108 (06/11 0727)  Labs:  Recent Labs  03/20/17 0030  03/21/17 0435 03/21/17 1117 03/22/17 0150  HGB 13.0  --   --   --  11.8*  HCT 43.6  --   --   --  39.5  PLT 298  --   --   --  192  CREATININE 2.07*  < > 2.06* 1.93* 1.61*  TROPONINI 0.04*  --   --   --   --   < > = values in this interval not displayed.  Estimated Creatinine Clearance: 58.6 mL/min (A) (by C-G formula based on SCr of 1.61 mg/dL (H)).   Medical History: Past Medical History:  Diagnosis Date  . CAD (coronary artery disease)   . Cellulitis and abscess of lower extremity   . CHF (congestive heart failure) (HCC)    EF 30%  . DM (diabetes mellitus) (HCC)     Medications:  Infusions:  . amiodarone     Followed by  . amiodarone      Assessment: 71 yo male with chronic CHF, CKD admitted with dyspnea and LE edema.  New onset atrial fibrillation.  Also with recent hematuria last hospital stay.  Pharmacy asked to begin anticoagulation with IV heparin.  Received dose of Lovenox 40 mg for DVT px at 1045 AM today.  Goal of Therapy:  Heparin level 0.3-0.7 units/ml Monitor platelets by anticoagulation protocol: Yes   Plan:  1. Start IV heparin at rate of 1400 units/hr. 2. Check heparin level 6 hrs after gtt starts. 3. Daily heparin level and CBC. 4.  F/u plans for oral anticoagulation eventually.  Uvaldo Rising, BCPS  Clinical Pharmacist Pager [phone]  03/22/2017 3:33 PM

## 2017-03-22 NOTE — Care Management Note (Addendum)
Case Management Note  Patient Details  Name: Christian Garner MRN: [id number] Date of Birth: 09-17-1946  Subjective/Objective:  Pt admitted with CHf                 Action/Plan:   Pt initially told CM that he stays alone - however later told CM that he has a wife.  Pt appears confused - CM left voicemail for wife requesting call back.   Pt will benefit from PT/OT eval based on CM assessment - CM will request from attending.  CM spoke directly with bedside nurse and informed of pts mental status during assessment - nurse to reassess as this differs from her am assessement   Expected Discharge Date:  03/27/17               Expected Discharge Plan:     In-House Referral:     Discharge planning Services  CM Consult  Post Acute Care Choice:    Choice offered to:     DME Arranged:    DME Agency:     HH Arranged:    HH Agency:     Status of Service:     If discussed at [employer], dates discussed:    Additional Comments:  Christian Labrador, RN 03/22/2017, 9:48 AM

## 2017-03-22 NOTE — Progress Notes (Signed)
Peripherally Inserted Central Catheter/Midline Placement  The IV Nurse has discussed with the patient and/or persons authorized to consent for the patient, the purpose of this procedure and the potential benefits and risks involved with this procedure.  The benefits include less needle sticks, lab draws from the catheter, and the patient may be discharged home with the catheter. Risks include, but not limited to, infection, bleeding, blood clot (thrombus formation), and puncture of an artery; nerve damage and irregular heartbeat and possibility to perform a PICC exchange if needed/ordered by physician.  Alternatives to this procedure were also discussed.  Bard Power PICC patient education guide, fact sheet on infection prevention and patient information card has been provided to patient /or left at bedside.    PICC/Midline Placement Documentation  PICC Triple Lumen 81/84/03 PICC Left Basilic 46 cm 0 cm (Active)  Indication for Insertion or Continuance of Line Limited venous access - need for IV therapy >5 days (PICC only) 03/22/2017  6:19 PM  Exposed Catheter (cm) 0 cm 03/22/2017  6:19 PM  Site Assessment Clean;Dry;Intact 03/22/2017  6:19 PM  Lumen #1 Status Flushed;Saline locked;Blood return noted 03/22/2017  6:19 PM  Lumen #2 Status Flushed;Saline locked;Blood return noted 03/22/2017  6:19 PM  Lumen #3 Status Flushed;Saline locked;Blood return noted 03/22/2017  6:19 PM  Dressing Type Transparent;Securing device 03/22/2017  6:19 PM  Dressing Status Clean;Dry;Intact;Antimicrobial disc in place 03/22/2017  6:19 PM  Dressing Change Due 03/29/17 03/22/2017  6:19 PM       Frances Maywood 03/22/2017, 6:22 PM

## 2017-03-22 NOTE — Consult Note (Signed)
Advanced Heart Failure Team Consult Note   Primary Physician: Dr Humphrey Rolls  Primary Cardiologist: Dr Jimmie Molly  Reason for Consultation: Heart Failure   HPI:    Christian Garner is seen today for evaluation of heart failure at the request of Dr Thereasa Solo.   Christian Garner is a 71 year old with a history of chronic systolic heart failure, chronic respiratory failure on home O2, CAD, CKD, and DM. Previously followed by [name] cardiology but stopped following.   In March 2018 he required intubation and CRRT for acute renal failure with hyperkalemia.   Initially presented to [facility] with increased dyspnea and leg edema. EKG completed and showed A fib RVR. He has been on low dose bb. CXR showed R pleural effusion so he had thoracentesis with 1.4 liters removed.  K > 6 so he has received multiple doses of kayexalate. He has been diuresing with IV lasix with sluggish urine output. Weight continues to trend up.    SOB with exertion. Requiring assistance to move to the chair.   ECHO 12/2016 EF 30-35% Mildly dilated RV.   [place] 2017 at [place]- Multivessel CAD. Severe stenosis of the LAD Moderate stenosis of the Circumflex; No change from prior study. No restenosis of the RCA . Moderate, Severe global LV systolic dysfunction. LV ejection fraction is 25-30 % Interventional Summary Successful PCI / Xience Drug Eluting Stent of the mid Left Anterior Descending Coronary Artery.  Review of Systems: [y] = yes, [ ]  = no   General: Weight gain [Y ]; Weight loss [ ] ; Anorexia [ ] ; Fatigue [Y ]; Fever [ ] ; Chills [ ] ; Weakness [ ]   Cardiac: Chest pain/pressure [ ] ; Resting SOB [Y ]; Exertional SOB [ ] ; Orthopnea [ Y]; Pedal Edema [Y ]; Palpitations [ ] ; Syncope [ ] ; Presyncope [ ] ; Paroxysmal nocturnal dyspnea[ ]   Pulmonary: Cough [ ] ; Wheezing[ ] ; Hemoptysis[ ] ; Sputum [ ] ; Snoring [ ]   GI: Vomiting[ ] ; Dysphagia[ ] ; Melena[ ] ; Hematochezia [ ] ; Heartburn[ ] ; Abdominal pain [ ] ;  Constipation [ ] ; Diarrhea [ ] ; BRBPR [ ]   GU: Hematuria[ ] ; Dysuria [ ] ; Nocturia[ ]   Vascular: Pain in legs with walking [ ] ; Pain in feet with lying flat [ ] ; Non-healing sores [ ] ; Stroke [ ] ; TIA [ ] ; Slurred speech [ ] ;  Neuro: Headaches[ ] ; Vertigo[ ] ; Seizures[ ] ; Paresthesias[ ] ;Blurred vision [ ] ; Diplopia [ ] ; Vision changes [ ]   Ortho/Skin: Arthritis [ ] ; Joint pain [Y ]; Muscle pain [ ] ; Joint swelling [ ] ; Back Pain [ ] ; Rash [ ]   Psych: Depression[ ] ; Anxiety[ ]   Heme: Bleeding problems [ ] ; Clotting disorders [ ] ; Anemia [ ]   Endocrine: Diabetes [Y ]; Thyroid dysfunction[ ]   Home Medications Prior to Admission medications   Medication Sig Start Date End Date Taking? Authorizing Provider  acetaminophen (TYLENOL) 325 MG tablet Take 975 mg by mouth at bedtime.    Yes [provider]  aspirin EC 81 MG tablet Take 81 mg by mouth at bedtime.   Yes [provider]  atorvastatin (LIPITOR) 40 MG tablet Take 40 mg by mouth at bedtime.    Yes [provider]  bismuth subsalicylate (PEPTO BISMOL) 262 MG/15ML suspension Take 30 mLs by mouth every 6 (six) hours as needed for indigestion.   Yes [provider]  calcium carbonate (TUMS - DOSED IN MG ELEMENTAL CALCIUM) 500 MG chewable tablet Chew 1 tablet by mouth daily as needed for indigestion  or heartburn.   Yes [provider]  carvedilol (COREG) 3.125 MG tablet Take 1 tablet (3.125 mg total) by mouth 2 (two) times daily with a meal. 01/05/17  Yes Bonnielee Haff, MD  clopidogrel (PLAVIX) 75 MG tablet Take 75 mg by mouth daily before breakfast.    Yes [provider]  ferrous sulfate 325 (65 FE) MG tablet Take 325 mg by mouth daily with breakfast.   Yes [provider]  fluticasone (FLONASE) 50 MCG/ACT nasal spray Place 1 spray into both nostrils daily as needed for allergies or rhinitis.   Yes [provider]  furosemide (LASIX) 40 MG tablet Take 40 mg by mouth 2 (two)  times daily. 03/16/17  Yes [provider]  nitroGLYCERIN (NITROSTAT) 0.4 MG SL tablet Place 0.4 mg under the tongue every 5 (five) minutes as needed for chest pain.   Yes [provider]  sitaGLIPtin (JANUVIA) 100 MG tablet Take 100 mg by mouth daily.   Yes [provider]  sulfamethoxazole-trimethoprim (BACTRIM DS,SEPTRA DS) 800-160 MG tablet Take 1 tablet by mouth daily. 03/12/17  Yes [provider]  vitamin B-12 (CYANOCOBALAMIN) 1000 MCG tablet Take 1,000 mcg by mouth daily.   Yes [provider]    Past Medical History: Past Medical History:  Diagnosis Date  . CAD (coronary artery disease)   . Cellulitis and abscess of lower extremity   . CHF (congestive heart failure) (HCC)    EF 30%  . DM (diabetes mellitus) ([place])     Past Surgical History: Past Surgical History:  Procedure Laterality Date  . CORONARY ANGIOPLASTY WITH STENT PLACEMENT    . HAND SURGERY     At [name], around 2014    Family History: Family History  Problem Relation Age of Onset  . Hypertension Mother   . Diabetes Mother   . Bone cancer Mother   . Hypertension Father   . Bladder Cancer Sister     Social History: Social History   Social History  . Marital status: Married    Spouse name: N/A  . Number of children: N/A  . Years of education: N/A   Social History Main Topics  . Smoking status: Former Research scientist (life sciences)  . Smokeless tobacco: Never Used  . Alcohol use None  . Drug use: Unknown  . Sexual activity: Not Asked   Other Topics Concern  . None   Social History Narrative  . None    Allergies:  No Known Allergies  Objective:    Vital Signs:   Temp:  [96.3 F (35.7 C)-97.9 F (36.6 C)] 97.9 F (36.6 C) (06/11 0726) Pulse Rate:  [83-148] 108 (06/11 0727) Resp:  [12-28] 18 (06/11 0727) BP: (98-124)/(62-104) 116/62 (06/11 0726) SpO2:  [90 %-99 %] 95 % (06/11 0727) Weight:  [270 lb 11.6 oz (122.8 kg)] 270 lb 11.6 oz (122.8 kg) (06/11  0333) Last BM Date: 03/21/17  Weight change: Filed Weights   03/19/17 2244 03/21/17 0315 03/22/17 0333  Weight: 267 lb 6.7 oz (121.3 kg) 262 lb 5.6 oz (119 kg) 270 lb 11.6 oz (122.8 kg)    Intake/Output:   Intake/Output Summary (Last 24 hours) at 03/22/17 1443 Last data filed at 03/22/17 1000  Gross per 24 hour  Intake             1380 ml  Output             1775 ml  Net             -  395 ml     Physical Exam: General:  Chronically ill appearing. No resp difficulty HEENT: normal Neck: supple. JVP to jaw  . Carotids 2+ bilat; no bruits. No lymphadenopathy or thyromegaly appreciated. Cor: PMI nondisplaced. Irregular  rate & rhythm. No rubs, gallops or murmurs. Lungs: Decreased on the Right. Crackles on Left. On 5 liters [place] Abdomen: obese, soft, nontender, distended. No hepatosplenomegaly. No bruits or masses. Good bowel sounds. Extremities: no cyanosis, clubbing, rash, Rand LLE 3+ edema. L hand ecchymotic. R and LLE hyperpigmentations.  Neuro: alert and oriented  x3, cranial nerves grossly intact. moves all 4 extremities w/o difficulty. Affect pleasant  Telemetry:  A Fib 90-100s   Labs: Basic Metabolic Panel:  Recent Labs Lab 03/20/17 0030 03/20/17 1052 03/21/17 0435 03/21/17 1117 03/22/17 0150  NA 135 130* 136 134* 132*  K 5.3* 6.4* 6.6* 6.2* 3.8  CL 98* 99* 99* 100* 94*  CO2 25 20* 29 22 27   GLUCOSE 219* 239* 168* 240* 143*  BUN 70* 76* 82* 88* 78*  CREATININE 2.07* 1.89* 2.06* 1.93* 1.61*  CALCIUM 8.7* 8.3* 8.3* 8.3* 8.2*  MG  --   --   --   --  2.6*  PHOS  --   --   --   --  4.5    Liver Function Tests:  Recent Labs Lab 03/20/17 0030 03/20/17 1052 03/21/17 0435  AST 26  --  65*  ALT 26  --  8*  ALKPHOS 103  --  82  BILITOT 0.9  --  1.7*  PROT 7.5 7.1 6.2*  ALBUMIN 3.4*  --  3.0*   No results for input(s): LIPASE, AMYLASE in the last 168 hours. No results for input(s): AMMONIA in the last 168 hours.  CBC:  Recent Labs Lab 03/20/17 0030  03/22/17 0150  WBC 16.5* 14.0*  NEUTROABS 15.8*  --   HGB 13.0 11.8*  HCT 43.6 39.5  MCV 98.6 97.3  PLT 298 192    Cardiac Enzymes:  Recent Labs Lab 03/20/17 0030  TROPONINI 0.04*    BNP: BNP (last 3 results)  Recent Labs  12/26/16 0716 12/29/16 0355  BNP 412.4* 236.2*    ProBNP (last 3 results) No results for input(s): PROBNP in the last 8760 hours.   CBG:  Recent Labs Lab 03/21/17 1223 03/21/17 1619 03/21/17 2116 03/22/17 0834 03/22/17 1147  GLUCAP 199* 201* 154* 158* 169*    Coagulation Studies: No results for input(s): LABPROT, INR in the last 72 hours.  Other results: EKG: No EKG on admit Imaging: Dg Chest Port 1 View  Result Date: 03/22/2017 CLINICAL DATA:  71 year old male with pleural effusion. Subsequent encounter. EXAM: PORTABLE CHEST 1 VIEW COMPARISON:  02/17/2017. FINDINGS: Increase in size of right-sided pleural effusion. Pulmonary vascular congestion asymmetric greater on the right. Cardiomegaly. Calcified aorta. No pneumothorax. IMPRESSION: Increase in size of right-sided pleural effusion. Pulmonary vascular prominence greater on the right. Cardiomegaly. Aortic atherosclerosis. Electronically Signed   By: Genia Del M.D.   On: 03/22/2017 07:07      Medications:     Current Medications: . aspirin EC  81 mg Oral QHS  . atorvastatin  40 mg Oral QHS  . carvedilol  3.125 mg Oral BID WC  . clopidogrel  75 mg Oral QAC breakfast  . enoxaparin (LOVENOX) injection  40 mg Subcutaneous Daily  . furosemide  80 mg Intravenous Q8H  . insulin aspart  0-20 Units Subcutaneous TID WC  . insulin aspart  0-5 Units Subcutaneous QHS  .  insulin glargine  6 Units Subcutaneous Daily     Infusions:    Assessment/Plan  Christian Garner is a22 year old admitted from [facility] with new onset A fib and A/C systolic heart failure. Diuresis has been poor.   1. A Fib RVR New onset. Start amio drip. Add heparin drip.  2. A/C Systolic Heart Failure-  ECHO 12/2016 EF 30-35%. RV mildly dilated. ICM.  Most recent [place] 2017 patent stent to LAD.  NYHA IIIb. Marked volume overload despite 80 mg IV lasix three times daily. A Fib certainly playing a role. Need to try and restore NSR. Load amio + heparin as above.  Will need to add milrinone to try and facilitate diuresis.  3. A/C Respiratory Failure On 5 liters oxygen. Check ABG now. Confused. ? retaning ?  3. AKI on CKD   -Creatinine March 2018 was 1.26. Creatinine peaked 2.06 today 1.6. Watch closely.   4. Hyperkalemia - K has been running > 6. Received multiple doses of kayexylate. Today K down to 3.8.  5. R pleural Effusion- S/P Thoracentesis.     Length of Stay: 3  Amy Clegg, NP  03/22/2017, 2:43 PM  Advanced Heart Failure Team Pager [phone] (M-F; 7a - 4p)  Please contact [facility] for night-coverage after hours (4p -7a ) and weekends on [email or web address]  Patient seen and examined with Darrick Grinder, NP. We discussed all aspects of the encounter. I agree with the assessment and plan as stated above.   Difficult situation. 71 y/o male with ischemic CM previously followed by Dr. Jimmie Molly in [place] presents with recurrent HF with massive volume overload in setting of new AF with RVR. Appears low output on exam.   AF rate now improved with IV amio. Agree with heparin. Suspect EF has dropped further due to AF. Will repeat echo. Start milrinone and increase IV lasix. PICC has been placed will follow co-ox and CVP.   Will likely need TEE/DC-CV prior to d/c.   Glori Bickers, MD  6:53 PM

## 2017-03-23 ENCOUNTER — Inpatient Hospital Stay (HOSPITAL_COMMUNITY): Payer: Medicare Other

## 2017-03-23 DIAGNOSIS — J9601 Acute respiratory failure with hypoxia: Secondary | ICD-10-CM

## 2017-03-23 DIAGNOSIS — I4891 Unspecified atrial fibrillation: Secondary | ICD-10-CM

## 2017-03-23 DIAGNOSIS — R338 Other retention of urine: Secondary | ICD-10-CM

## 2017-03-23 DIAGNOSIS — N170 Acute kidney failure with tubular necrosis: Secondary | ICD-10-CM

## 2017-03-23 DIAGNOSIS — N183 Chronic kidney disease, stage 3 (moderate): Secondary | ICD-10-CM

## 2017-03-23 DIAGNOSIS — I340 Nonrheumatic mitral (valve) insufficiency: Secondary | ICD-10-CM

## 2017-03-23 DIAGNOSIS — J9602 Acute respiratory failure with hypercapnia: Secondary | ICD-10-CM

## 2017-03-23 LAB — CBC
HCT: 36 % — ABNORMAL LOW (ref 39.0–52.0)
Hemoglobin: 10.8 g/dL — ABNORMAL LOW (ref 13.0–17.0)
MCH: 28.6 pg (ref 26.0–34.0)
MCHC: 30 g/dL (ref 30.0–36.0)
MCV: 95.2 fL (ref 78.0–100.0)
Platelets: 170 10*3/uL (ref 150–400)
RBC: 3.78 MIL/uL — ABNORMAL LOW (ref 4.22–5.81)
RDW: 16.2 % — AB (ref 11.5–15.5)
WBC: 12.1 10*3/uL — AB (ref 4.0–10.5)

## 2017-03-23 LAB — ECHOCARDIOGRAM COMPLETE
AOASC: 36 cm
FS: 20 % — AB (ref 28–44)
HEIGHTINCHES: 74 in
IVS/LV PW RATIO, ED: 1.21
LA diam index: 2.31 cm/m2
LA vol A4C: 149 ml
LA vol: 104 mL
LASIZE: 59 mm
LAVOLIN: 40.8 mL/m2
LEFT ATRIUM END SYS DIAM: 59 mm
LVOT area: 3.46 cm2
LVOT diameter: 21 mm
MRPISAEROA: 0.14 cm2
PW: 14.6 mm — AB (ref 0.6–1.1)
Reg peak vel: 334 cm/s
TAPSE: 11.3 mm
TRMAXVEL: 334 cm/s
VTI: 126 cm
WEIGHTICAEL: 4278.69 [oz_av]

## 2017-03-23 LAB — BASIC METABOLIC PANEL
Anion gap: 9 (ref 5–15)
BUN: 75 mg/dL — AB (ref 6–20)
CO2: 30 mmol/L (ref 22–32)
Calcium: 8 mg/dL — ABNORMAL LOW (ref 8.9–10.3)
Chloride: 94 mmol/L — ABNORMAL LOW (ref 101–111)
Creatinine, Ser: 1.53 mg/dL — ABNORMAL HIGH (ref 0.61–1.24)
GFR calc Af Amer: 51 mL/min — ABNORMAL LOW (ref >60)
GFR, EST NON AFRICAN AMERICAN: 44 mL/min — AB (ref >60)
GLUCOSE: 254 mg/dL — AB (ref 65–99)
Potassium: 3.6 mmol/L (ref 3.5–5.1)
SODIUM: 133 mmol/L — AB (ref 135–145)

## 2017-03-23 LAB — COOXEMETRY PANEL
Carboxyhemoglobin: 1.5 % (ref 0.5–1.5)
Methemoglobin: 1.1 % (ref 0.0–1.5)
O2 SAT: 61.6 %
TOTAL HEMOGLOBIN: 11.2 g/dL — AB (ref 12.0–16.0)

## 2017-03-23 LAB — GLUCOSE, CAPILLARY
GLUCOSE-CAPILLARY: 153 mg/dL — AB (ref 65–99)
GLUCOSE-CAPILLARY: 205 mg/dL — AB (ref 65–99)
Glucose-Capillary: 194 mg/dL — ABNORMAL HIGH (ref 65–99)
Glucose-Capillary: 247 mg/dL — ABNORMAL HIGH (ref 65–99)

## 2017-03-23 LAB — HEPARIN LEVEL (UNFRACTIONATED)
Heparin Unfractionated: 0.43 IU/mL (ref 0.30–0.70)
Heparin Unfractionated: 0.56 IU/mL (ref 0.30–0.70)

## 2017-03-23 MED ORDER — SPIRONOLACTONE 25 MG PO TABS
12.5000 mg | ORAL_TABLET | Freq: Every day | ORAL | Status: DC
  Administered 2017-03-23 – 2017-03-24 (×2): 12.5 mg via ORAL
  Filled 2017-03-23 (×2): qty 1

## 2017-03-23 MED ORDER — POTASSIUM CHLORIDE CRYS ER 20 MEQ PO TBCR
40.0000 meq | EXTENDED_RELEASE_TABLET | Freq: Once | ORAL | Status: AC
  Administered 2017-03-23: 40 meq via ORAL
  Filled 2017-03-23: qty 2

## 2017-03-23 MED ORDER — METOLAZONE 5 MG PO TABS: 5.0000 mg | ORAL_TABLET | Freq: Two times a day (BID) | ORAL | Status: DC

## 2017-03-23 MED ORDER — METOLAZONE 5 MG PO TABS
5.0000 mg | ORAL_TABLET | Freq: Once | ORAL | Status: AC
  Administered 2017-03-23: 5 mg via ORAL
  Filled 2017-03-23: qty 1

## 2017-03-23 NOTE — Progress Notes (Signed)
PROGRESS NOTE    Christian Garner  QMV:[id number] DOB: 1946-05-30 DOA: 03/19/2017 PCP: [name], MD   Brief Narrative:  71 y.o.WM PMHx Chronic systolic CHF (EF 28%), home O2, CAD s/p PCI, CKD baseline Cr 1.3, and DMwho presented with 3-4 days of progressive dyspnea accompanied by leg swelling.  In the ED at [facility] an EKG noted atrial fibrillation w/ a HR of 115.  CXR noted a large R pleural effusion.  He was transferred to [name].  Of note he had an admission to [facility] in March 2018 during which he required intubation as well as CRRT for acute renal failure w/ hyperkalemia and a large transudative pleural effusion.   Subjective: 6/12  A/O 4, positive acute on chronic respiratory distress. Negative CP. Negative N/V. States just started using home O2 sat unsure of his home O2 requirement. Unsure of his dry weight.    Assessment & Plan:   Principal Problem:   Acute on chronic respiratory failure with hypoxia and hypercapnia (HCC) Active Problems:   AKI (acute kidney injury) ([place])   Pleural effusion   Acute on chronic combined systolic and diastolic CHF (congestive heart failure) (HCC)   AF (paroxysmal atrial fibrillation) ([place])   Community acquired pneumonia   Pressure injury of skin   Acute hypoxic and hypercapnic respiratory failure Appears to be primarily related to volume overload in the setting of a CHF exacerbation + pleural effusion   Newly appreciated atrial fibrillation with RVR Rate reasonably controlled at this time - avoiding CCB  Acute exacerbation of chronic systolic and diastolic congestive heart failure Ejection fraction 30% via TTE March 2018 w/ grade 2 DD - continuing to attempt diuresis but thus far response has been poor - will ask CHF Team to assist   Recurrent Right-sided pleural effusion Most likely related to CHF - ultrasound-guided thoracentesis yielding ~1.4L accomplished on 6/9 - f/u CXR today suggests progressive re-accumulation    Acute kidney injury on chronic kidney disease Creatinine 1.29 at time of discharge 01/05/17 - creatinine holding steady for now but not at baseline - cont to follow closely   Hyperkalemia Due to acute renal injury - now resolved s/p multiple doses of kayexalate   Possible Right lower lobe pneumonia Procalcitonin not consistent with acute bacterial infection - discontinued antibiotics 6/9   Acute urinary retention Foley in place - likely related to BPH - follow  DM Type 2 -Hemoglobin A1c pending -Lipid panel pending CBG reasonably controlled at this time - follow trend   CAD with multiple stents DES 03/2016     DVT prophylaxis: Lovenox Code Status: Full Family Communication: None Disposition Plan: Per cardiology   Consultants:  Cardiology CHF team  Procedures/Significant Events:  None   VENTILATOR SETTINGS: NA   Cultures   Antimicrobials: Anti-infectives    Start     Stop   03/20/17 2200  levofloxacin (LEVAQUIN) IVPB 500 mg  Status:  Discontinued     03/20/17 1108   03/20/17 0030  levofloxacin (LEVAQUIN) IVPB 750 mg     03/20/17 0215       Devices NA   LINES / TUBES:      Continuous Infusions: . amiodarone 30 mg/hr (03/23/17 1312)  . heparin 1,400 Units/hr (03/23/17 1134)  . milrinone 0.25 mcg/kg/min (03/23/17 1312)     Objective: Vitals:   03/23/17 0400 03/23/17 0808 03/23/17 1202 03/23/17 1545  BP: (!) 96/59 (!) 126/99 111/70 112/75  Pulse: (!) 104 99 (!) 57 (!) 54  Resp: 17  19 (!) 21 18  Temp:  97.6 F (36.4 C) 97.8 F (36.6 C) 97.8 F (36.6 C)  TempSrc:  Oral Oral Oral  SpO2: 98% 90% 91% 100%  Weight:      Height:        Intake/Output Summary (Last 24 hours) at 03/23/17 1616 Last data filed at 03/23/17 1134  Gross per 24 hour  Intake           764.19 ml  Output             1200 ml  Net          -435.81 ml   Filed Weights   03/21/17 0315 03/22/17 0333 03/23/17 0327  Weight: 262 lb 5.6 oz (119 kg) 270 lb 11.6 oz  (122.8 kg) 267 lb 6.7 oz (121.3 kg)    Examination:  General:A/O 4, positive acute on chronic respiratory distress Eyes: negative scleral hemorrhage, negative anisocoria, negative icterus ENT: Negative Runny nose, negative gingival bleeding, Neck:  Negative scars, masses, torticollis, lymphadenopathy, JVD Lungs: Clear to auscultation bilaterally without wheezes or crackles Cardiovascular: Regular rate and rhythm without murmur gallop or rub normal S1 and S2 Abdomen: Obese, negative abdominal pain, nondistended, positive soft, bowel sounds, no rebound, no ascites, no appreciable mass Extremities: positive cyanosis, positive bilateral lower extremity edema 2-3+ to thighs, positive left arm/hand cyanosis  Skin: Negative rashes, lesions, ulcers Psychiatric:  Negative depression, negative anxiety, negative fatigue, negative mania  Central nervous system:  Cranial nerves II through XII intact, tongue/uvula midline, all extremities muscle strength 5/5, sensation intact throughout,  negative dysarthria, negative expressive aphasia, negative receptive aphasia.  .     Data Reviewed: Care during the described time interval was provided by me .  I have reviewed this patient's available data, including medical history, events of note, physical examination, and all test results as part of my evaluation. I have personally reviewed and interpreted all radiology studies.  CBC:  Recent Labs Lab 03/20/17 0030 03/22/17 0150 03/23/17 0357  WBC 16.5* 14.0* 12.1*  NEUTROABS 15.8*  --   --   HGB 13.0 11.8* 10.8*  HCT 43.6 39.5 36.0*  MCV 98.6 97.3 95.2  PLT 298 192 696   Basic Metabolic Panel:  Recent Labs Lab 03/20/17 1052 03/21/17 0435 03/21/17 1117 03/22/17 0150 03/23/17 0357  NA 130* 136 134* 132* 133*  K 6.4* 6.6* 6.2* 3.8 3.6  CL 99* 99* 100* 94* 94*  CO2 20* 29 22 27 30   GLUCOSE 239* 168* 240* 143* 254*  BUN 76* 82* 88* 78* 75*  CREATININE 1.89* 2.06* 1.93* 1.61* 1.53*  CALCIUM  8.3* 8.3* 8.3* 8.2* 8.0*  MG  --   --   --  2.6*  --   PHOS  --   --   --  4.5  --    GFR: Estimated Creatinine Clearance: 61.3 mL/min (A) (by C-G formula based on SCr of 1.53 mg/dL (H)). Liver Function Tests:  Recent Labs Lab 03/20/17 0030 03/20/17 1052 03/21/17 0435  AST 26  --  65*  ALT 26  --  8*  ALKPHOS 103  --  82  BILITOT 0.9  --  1.7*  PROT 7.5 7.1 6.2*  ALBUMIN 3.4*  --  3.0*   No results for input(s): LIPASE, AMYLASE in the last 168 hours. No results for input(s): AMMONIA in the last 168 hours. Coagulation Profile: No results for input(s): INR, PROTIME in the last 168 hours. Cardiac Enzymes:  Recent Labs Lab 03/20/17 0030  TROPONINI 0.04*   BNP (last 3 results) No results for input(s): PROBNP in the last 8760 hours. HbA1C: No results for input(s): HGBA1C in the last 72 hours. CBG:  Recent Labs Lab 03/22/17 1636 03/22/17 2157 03/23/17 0807 03/23/17 1201 03/23/17 1544  GLUCAP 180* 200* 153* 194* 205*   Lipid Profile: No results for input(s): CHOL, HDL, LDLCALC, TRIG, CHOLHDL, LDLDIRECT in the last 72 hours. Thyroid Function Tests:  Recent Labs  03/22/17 0150  TSH 2.559   Anemia Panel: No results for input(s): VITAMINB12, FOLATE, FERRITIN, TIBC, IRON, RETICCTPCT in the last 72 hours. Urine analysis:    Component Value Date/Time   COLORURINE YELLOW 03/19/2017 2359   APPEARANCEUR HAZY (A) 03/19/2017 2359   LABSPEC 1.011 03/19/2017 2359   PHURINE 5.0 03/19/2017 2359   GLUCOSEU NEGATIVE 03/19/2017 2359   HGBUR LARGE (A) 03/19/2017 2359   BILIRUBINUR NEGATIVE 03/19/2017 2359   KETONESUR NEGATIVE 03/19/2017 2359   PROTEINUR NEGATIVE 03/19/2017 2359   NITRITE NEGATIVE 03/19/2017 2359   LEUKOCYTESUR NEGATIVE 03/19/2017 2359   Sepsis Labs: @LABRCNTIP (procalcitonin:4,lacticidven:4)  ) Recent Results (from the past 240 hour(s))  MRSA PCR Screening     Status: None   Collection Time: 03/19/17 10:47 PM  Result Value Ref Range Status   MRSA by  PCR NEGATIVE NEGATIVE Final    Comment:        The GeneXpert MRSA Assay (FDA approved for NASAL specimens only), is one component of a comprehensive MRSA colonization surveillance program. It is not intended to diagnose MRSA infection nor to guide or monitor treatment for MRSA infections.   Culture, blood (single)     Status: None (Preliminary result)   Collection Time: 03/20/17 12:30 AM  Result Value Ref Range Status   Specimen Description BLOOD LEFT ANTECUBITAL  Final   Special Requests   Final    BOTTLES DRAWN AEROBIC AND ANAEROBIC Blood Culture adequate volume   Culture NO GROWTH 3 DAYS  Final   Report Status PENDING  Incomplete  Culture, body fluid-bottle     Status: None (Preliminary result)   Collection Time: 03/20/17  2:46 PM  Result Value Ref Range Status   Specimen Description FLUID RIGHT PLEURAL  Final   Special Requests NONE  Final   Culture NO GROWTH 3 DAYS  Final   Report Status PENDING  Incomplete  Gram stain     Status: None   Collection Time: 03/20/17  2:46 PM  Result Value Ref Range Status   Specimen Description FLUID RIGHT PLEURAL  Final   Special Requests NONE  Final   Gram Stain   Final    RARE WBC PRESENT, PREDOMINANTLY MONONUCLEAR NO ORGANISMS SEEN    Report Status 03/20/2017 FINAL  Final         Radiology Studies: Dg Chest Port 1 View  Result Date: 03/22/2017 CLINICAL DATA:  post insertion to confirm placement EXAM: PORTABLE CHEST 1 VIEW COMPARISON:  Chest x-ray from earlier same day. FINDINGS: Left-sided PICC line is been placed with tip well positioned at the level of the lower SVC/cavoatrial junction. Stable cardiomegaly. Atherosclerotic changes again noted at the aortic arch. Vague opacities are again seen at each lung base, right greater the left, atelectasis versus layering pleural effusions. At least mild interstitial edema bilaterally. IMPRESSION: 1. Left-sided PICC line appears well positioned with tip at the level of the lower  SVC/cavoatrial junction. 2. No other change in the short-term interval. Probable small bilateral pleural effusions, right greater than left. Stable central pulmonary vascular congestion and  interstitial edema. Stable cardiomegaly. 3. Aortic atherosclerosis. Electronically Signed   By: Franki Cabot M.D.   On: 03/22/2017 18:43   Dg Chest Port 1 View  Result Date: 03/22/2017 CLINICAL DATA:  71 year old male with pleural effusion. Subsequent encounter. EXAM: PORTABLE CHEST 1 VIEW COMPARISON:  02/17/2017. FINDINGS: Increase in size of right-sided pleural effusion. Pulmonary vascular congestion asymmetric greater on the right. Cardiomegaly. Calcified aorta. No pneumothorax. IMPRESSION: Increase in size of right-sided pleural effusion. Pulmonary vascular prominence greater on the right. Cardiomegaly. Aortic atherosclerosis. Electronically Signed   By: Genia Del M.D.   On: 03/22/2017 07:07        Scheduled Meds: . aspirin EC  81 mg Oral QHS  . atorvastatin  40 mg Oral QHS  . clopidogrel  75 mg Oral QAC breakfast  . furosemide  80 mg Intravenous Q8H  . insulin aspart  0-20 Units Subcutaneous TID WC  . insulin aspart  0-5 Units Subcutaneous QHS  . insulin glargine  6 Units Subcutaneous Daily  . sodium chloride flush  10-40 mL Intracatheter Q12H  . spironolactone  12.5 mg Oral Daily   Continuous Infusions: . amiodarone 30 mg/hr (03/23/17 1312)  . heparin 1,400 Units/hr (03/23/17 1134)  . milrinone 0.25 mcg/kg/min (03/23/17 1312)     LOS: 4 days    Time spent: 40 minutes    WOODS, Geraldo Docker, MD [facility] Pager [phone]   If 7PM-7AM, please contact night-coverage [email or web address] Password [facility] 03/23/2017, 4:16 PM

## 2017-03-23 NOTE — Progress Notes (Addendum)
Advanced Heart Failure Rounding Note   Subjective:    Yesterday he was started on amio drip, heparin drip, and milrinone 0.25 mcg. Also diuresed with IV lasix. Sluggish response. PICC placed. CVP 14-15 Co-ox 51%->62%  Denies SOB. Denies CP. Hungry.   ECHO 12/2016 EF 30-35% Mildly dilated RV.   [facility] 2017 at [place]- Multivessel CAD. Severe stenosis of the LAD Moderate stenosis of the Circumflex; No change from prior study. No restenosis of the RCA . Moderate, Severe global LV systolic dysfunction. LV ejection fraction is 25-30 % Interventional Summary Successful PCI / Xience Drug Eluting Stent of the mid Left Anterior Descending Coronary Artery.   Objective:   Weight Range:  Vital Signs:   Temp:  [97.4 F (36.3 C)-97.8 F (36.6 C)] 97.8 F (36.6 C) (06/12 0327) Pulse Rate:  [85-104] 104 (06/12 0400) Resp:  [17-18] 17 (06/12 0400) BP: (85-121)/(58-67) 96/59 (06/12 0400) SpO2:  [94 %-99 %] 98 % (06/12 0400) Weight:  [267 lb 6.7 oz (121.3 kg)] 267 lb 6.7 oz (121.3 kg) (06/12 0327) Last BM Date: 03/21/17  Weight change: Filed Weights   03/21/17 0315 03/22/17 0333 03/23/17 0327  Weight: 262 lb 5.6 oz (119 kg) 270 lb 11.6 oz (122.8 kg) 267 lb 6.7 oz (121.3 kg)    Intake/Output:   Intake/Output Summary (Last 24 hours) at 03/23/17 0758 Last data filed at 03/23/17 0600  Gross per 24 hour  Intake           794.92 ml  Output             1000 ml  Net          -205.08 ml     Physical Exam: CVP 15 General:  Well appearing. No resp difficulty. Sitting in the chair.  HEENT: normal Neck: supple. JVP to jaw . Carotids 2+ bilat; no bruits. No lymphadenopathy or thryomegaly appreciated. Cor: PMI nondisplaced. Irregular. Regular rate & rhythm. No rubs, gallops or murmurs. Lungs: LLL crackles Decreased RLL on 5 liters [place] Abdomen: obese, soft, nontender, nondistended. No hepatosplenomegaly. No bruits or masses. Good bowel sounds. Extremities: no cyanosis, clubbing,  rash, R and LLE 3+ edema. Severe erythema bialterally  Left hand ecchymotic. LUE PICC Neuro: alert & orientedx3, cranial nerves grossly intact. moves all 4 extremities w/o difficulty. Affect pleasant GU: Foley yellow urine  Telemetry: A fib 90-100s personally reviewed  Labs: Basic Metabolic Panel:  Recent Labs Lab 03/20/17 1052 03/21/17 0435 03/21/17 1117 03/22/17 0150 03/23/17 0357  NA 130* 136 134* 132* 133*  K 6.4* 6.6* 6.2* 3.8 3.6  CL 99* 99* 100* 94* 94*  CO2 20* 29 22 27 30   GLUCOSE 239* 168* 240* 143* 254*  BUN 76* 82* 88* 78* 75*  CREATININE 1.89* 2.06* 1.93* 1.61* 1.53*  CALCIUM 8.3* 8.3* 8.3* 8.2* 8.0*  MG  --   --   --  2.6*  --   PHOS  --   --   --  4.5  --     Liver Function Tests:  Recent Labs Lab 03/20/17 0030 03/20/17 1052 03/21/17 0435  AST 26  --  65*  ALT 26  --  8*  ALKPHOS 103  --  82  BILITOT 0.9  --  1.7*  PROT 7.5 7.1 6.2*  ALBUMIN 3.4*  --  3.0*   No results for input(s): LIPASE, AMYLASE in the last 168 hours. No results for input(s): AMMONIA in the last 168 hours.  CBC:  Recent Labs Lab 03/20/17  0030 03/22/17 0150 03/23/17 0357  WBC 16.5* 14.0* 12.1*  NEUTROABS 15.8*  --   --   HGB 13.0 11.8* 10.8*  HCT 43.6 39.5 36.0*  MCV 98.6 97.3 95.2  PLT 298 192 170    Cardiac Enzymes:  Recent Labs Lab 03/20/17 0030  TROPONINI 0.04*    BNP: BNP (last 3 results)  Recent Labs  12/26/16 0716 12/29/16 0355  BNP 412.4* 236.2*    ProBNP (last 3 results) No results for input(s): PROBNP in the last 8760 hours.    Other results:  Imaging: Dg Chest Port 1 View  Result Date: 03/22/2017 CLINICAL DATA:  post insertion to confirm placement EXAM: PORTABLE CHEST 1 VIEW COMPARISON:  Chest x-ray from earlier same day. FINDINGS: Left-sided PICC line is been placed with tip well positioned at the level of the lower SVC/cavoatrial junction. Stable cardiomegaly. Atherosclerotic changes again noted at the aortic arch. Vague opacities  are again seen at each lung base, right greater the left, atelectasis versus layering pleural effusions. At least mild interstitial edema bilaterally. IMPRESSION: 1. Left-sided PICC line appears well positioned with tip at the level of the lower SVC/cavoatrial junction. 2. No other change in the short-term interval. Probable small bilateral pleural effusions, right greater than left. Stable central pulmonary vascular congestion and interstitial edema. Stable cardiomegaly. 3. Aortic atherosclerosis. Electronically Signed   By: Franki Cabot M.D.   On: 03/22/2017 18:43   Dg Chest Port 1 View  Result Date: 03/22/2017 CLINICAL DATA:  71 year old male with pleural effusion. Subsequent encounter. EXAM: PORTABLE CHEST 1 VIEW COMPARISON:  02/17/2017. FINDINGS: Increase in size of right-sided pleural effusion. Pulmonary vascular congestion asymmetric greater on the right. Cardiomegaly. Calcified aorta. No pneumothorax. IMPRESSION: Increase in size of right-sided pleural effusion. Pulmonary vascular prominence greater on the right. Cardiomegaly. Aortic atherosclerosis. Electronically Signed   By: Genia Del M.D.   On: 03/22/2017 07:07      Medications:     Scheduled Medications: . aspirin EC  81 mg Oral QHS  . atorvastatin  40 mg Oral QHS  . carvedilol  3.125 mg Oral BID WC  . clopidogrel  75 mg Oral QAC breakfast  . furosemide  80 mg Intravenous Q8H  . insulin aspart  0-20 Units Subcutaneous TID WC  . insulin aspart  0-5 Units Subcutaneous QHS  . insulin glargine  6 Units Subcutaneous Daily  . sodium chloride flush  10-40 mL Intracatheter Q12H     Infusions: . amiodarone 30 mg/hr (03/23/17 0350)  . heparin 1,400 Units/hr (03/23/17 0655)  . milrinone 0.25 mcg/kg/min (03/23/17 0350)     PRN Medications:  acetaminophen **OR** acetaminophen, ondansetron **OR** ondansetron (ZOFRAN) IV, sodium chloride flush   Assessment/Plan/Discussion:   Mr Jacquin is a71 year old admitted from [facility] with new onset A fib and A/C systolic heart failure.   1. A Fib RVR- This patients CHA2DS2-VASc Score 4 and unadjusted Ischemic Stroke Rate 4%.   New onset. Remains in A fib.  Continue amio at 30 mg per hour. Continue heparin. Will need TEE/DC-CV at some point.   2. A/C Systolic Heart Failure- ECHO 12/2016 EF 30-35%. RV mildly dilated. ICM.  Most recent [place] 2017 with DES to LAD  NYHA IIIb.  Todays CO-OX is 62% on 0.25 mcg of milrinone. Sluggish urine output.  Marked volume overload. CVP 14-15. Continue IV lasix 80 mg three times a day and add 5 mg metolazone daily.  Will need to try and restore NSR once volume status improved.  Stop coreg.  Add 12.5 mg spiro daily.  3. A/C Respiratory Failure On 5 liters oxygen. O2 sats stable.  3. AKI on CKD   -Creatinine March 2018 was 1.26. Creatinine peaked 2.06. Creatinine down a little 1.53.  Watch closely.   4. Hyperkalemia - K has been running > 6. Received multiple doses of kayexylate. K 3.6 .   5. R pleural Effusion- S/P Thoracentesis. 6. CAD- Multivessel CAD - DES LAD, mod stenosis circumflex. No bb for now. Continue asa and plavix. On statin.  7. LE cellulitis - on rocephin   Consult cardiac rehab.      Length of Stay: 4   Amy Clegg NP-C  03/23/2017, 7:58 AM  Advanced Heart Failure Team Pager [phone] (M-F; 7a - 4p)  Please contact [facility] for night-coverage after hours (4p -7a ) and weekends on [email or web address]  Patient seen and examined with Darrick Grinder, NP. We discussed all aspects of the encounter. I agree with the assessment and plan as stated above.   Remains very tenuous. Markedly volume overloaded.   PICC placed yesterday. Co-ox initially 51% now up to 62% with milrinone. Diuresis picking up but still sluggish. Continue milrinone and IV lasix. Adding metolazone and spiro.   Remains in AF. Rate improved on amio and heparin. Will likely need TEE and DC-CV prior to discharge   Repeat echo today. Suspect EF down  further in setting of AF.   Hyperkalemia improved with correction of low output. Renal function slightly improved.  No signs/symptoms of coronary ischemia. Suspect main issue for decompensation is development of AF.  Continue abx for cellulitis.   He has very limited insight into his disease process.   Glori Bickers, MD  9:06 AM

## 2017-03-23 NOTE — Progress Notes (Signed)
ANTICOAGULATION CONSULT NOTE - Follow Up Consult  Pharmacy Consult for Heparin Indication: atrial fibrillation  No Known Allergies  Patient Measurements: Height: 6\' 2"  (188 cm) Weight: 267 lb 6.7 oz (121.3 kg) IBW/kg (Calculated) : 82.2 Heparin Dosing Weight: 108kg  Vital Signs: Temp: 97.8 F (36.6 C) (06/12 1202) Temp Source: Oral (06/12 1202) BP: 111/70 (06/12 1202) Pulse Rate: 57 (06/12 1202)  Labs:  Recent Labs  03/21/17 1117 03/22/17 0150 03/23/17 0357 03/23/17 1200  HGB  --  11.8* 10.8*  --   HCT  --  39.5 36.0*  --   PLT  --  192 170  --   HEPARINUNFRC  --   --  0.43 0.56  CREATININE 1.93* 1.61* 1.53*  --     Estimated Creatinine Clearance: 61.3 mL/min (A) (by C-G formula based on SCr of 1.53 mg/dL (H)).   Medications:  Heparin @ 1400 units/hr  Assessment: 70yom continues on heparin for new afib. Confirmatory heparin level is therapeutic at 0.56.  Hgb 11.8 > 10.8, plts ok. No bleeding.  Goal of Therapy:  Heparin level 0.3-0.7 units/ml Monitor platelets by anticoagulation protocol: Yes   Plan:  1) Continue heparin at 1400 units/hr 2) Daily heparin level and CBC 3) Follow up transition to oral AC  Deboraha Sprang 03/23/2017,1:30 PM

## 2017-03-23 NOTE — Progress Notes (Signed)
ANTICOAGULATION CONSULT NOTE - Follow Up Consult  Pharmacy Consult for Heparin  Indication: atrial fibrillation  No Known Allergies  Patient Measurements: Height: 6\' 2"  (188 cm) Weight: 267 lb 6.7 oz (121.3 kg) IBW/kg (Calculated) : 82.2  Vital Signs: Temp: 97.8 F (36.6 C) (06/12 0327) Temp Source: Oral (06/12 0327) BP: 96/59 (06/12 0400) Pulse Rate: 104 (06/12 0400)  Labs:  Recent Labs  03/21/17 1117 03/22/17 0150 03/23/17 0357  HGB  --  11.8* 10.8*  HCT  --  39.5 36.0*  PLT  --  192 170  HEPARINUNFRC  --   --  0.43  CREATININE 1.93* 1.61* 1.53*    Estimated Creatinine Clearance: 61.3 mL/min (A) (by C-G formula based on SCr of 1.53 mg/dL (H)).  Assessment: 71 y/o M on heparin for new onset afib. Initial heparin level is therapeutic, possible TEE/DCCV before discharge.   Goal of Therapy:  Heparin level 0.3-0.7 units/ml Monitor platelets by anticoagulation protocol: Yes   Plan:  -Cont heparin at 1400 units/hr -1200 HL  Narda Bonds 03/23/2017,5:22 AM

## 2017-03-23 NOTE — Progress Notes (Signed)
  Echocardiogram 2D Echocardiogram has been performed.  Christian Garner G Christian Garner 03/23/2017, 1:50 PM

## 2017-03-24 DIAGNOSIS — N179 Acute kidney failure, unspecified: Secondary | ICD-10-CM

## 2017-03-24 LAB — BASIC METABOLIC PANEL WITH GFR
Anion gap: 10 (ref 5–15)
BUN: 64 mg/dL — ABNORMAL HIGH (ref 6–20)
CO2: 31 mmol/L (ref 22–32)
Calcium: 8.1 mg/dL — ABNORMAL LOW (ref 8.9–10.3)
Chloride: 92 mmol/L — ABNORMAL LOW (ref 101–111)
Creatinine, Ser: 1.33 mg/dL — ABNORMAL HIGH (ref 0.61–1.24)
GFR calc Af Amer: >60 mL/min
GFR calc non Af Amer: 52 mL/min — ABNORMAL LOW
Glucose, Bld: 146 mg/dL — ABNORMAL HIGH (ref 65–99)
Potassium: 3.6 mmol/L (ref 3.5–5.1)
Sodium: 133 mmol/L — ABNORMAL LOW (ref 135–145)

## 2017-03-24 LAB — LIPID PANEL
Cholesterol: 72 mg/dL (ref 0–200)
HDL: 27 mg/dL — ABNORMAL LOW
LDL Cholesterol: 32 mg/dL (ref 0–99)
Total CHOL/HDL Ratio: 2.7 ratio
Triglycerides: 63 mg/dL
VLDL: 13 mg/dL (ref 0–40)

## 2017-03-24 LAB — CBC
HCT: 35.8 % — ABNORMAL LOW (ref 39.0–52.0)
Hemoglobin: 11 g/dL — ABNORMAL LOW (ref 13.0–17.0)
MCH: 29.1 pg (ref 26.0–34.0)
MCHC: 30.7 g/dL (ref 30.0–36.0)
MCV: 94.7 fL (ref 78.0–100.0)
Platelets: 171 K/uL (ref 150–400)
RBC: 3.78 MIL/uL — ABNORMAL LOW (ref 4.22–5.81)
RDW: 16.1 % — ABNORMAL HIGH (ref 11.5–15.5)
WBC: 11.8 K/uL — ABNORMAL HIGH (ref 4.0–10.5)

## 2017-03-24 LAB — COOXEMETRY PANEL
Carboxyhemoglobin: 1.5 % (ref 0.5–1.5)
Methemoglobin: 1.1 % (ref 0.0–1.5)
O2 Saturation: 58.4 %
Total hemoglobin: 10.8 g/dL — ABNORMAL LOW (ref 12.0–16.0)

## 2017-03-24 LAB — HEPARIN LEVEL (UNFRACTIONATED): HEPARIN UNFRACTIONATED: 0.56 [IU]/mL (ref 0.30–0.70)

## 2017-03-24 LAB — GLUCOSE, CAPILLARY
GLUCOSE-CAPILLARY: 126 mg/dL — AB (ref 65–99)
GLUCOSE-CAPILLARY: 199 mg/dL — AB (ref 65–99)
Glucose-Capillary: 207 mg/dL — ABNORMAL HIGH (ref 65–99)
Glucose-Capillary: 182 mg/dL — ABNORMAL HIGH (ref 65–99)

## 2017-03-24 MED ORDER — SPIRONOLACTONE 25 MG PO TABS
25.0000 mg | ORAL_TABLET | Freq: Every day | ORAL | Status: DC
  Administered 2017-03-25 – 2017-03-31 (×7): 25 mg via ORAL
  Filled 2017-03-24 (×7): qty 1

## 2017-03-24 MED ORDER — POTASSIUM CHLORIDE CRYS ER 20 MEQ PO TBCR
20.0000 meq | EXTENDED_RELEASE_TABLET | Freq: Once | ORAL | Status: AC
  Administered 2017-03-24: 20 meq via ORAL
  Filled 2017-03-24: qty 1

## 2017-03-24 MED ORDER — "THROMBI-PAD 3""X3"" EX PADS"
1.0000 | MEDICATED_PAD | Freq: Once | CUTANEOUS | Status: AC
  Administered 2017-03-24: 1 via TOPICAL
  Filled 2017-03-24: qty 1

## 2017-03-24 MED ORDER — METOLAZONE 5 MG PO TABS
5.0000 mg | ORAL_TABLET | Freq: Once | ORAL | Status: AC
  Administered 2017-03-24: 5 mg via ORAL
  Filled 2017-03-24: qty 1

## 2017-03-24 MED FILL — Perflutren Lipid Microsphere IV Susp 1.1 MG/ML: INTRAVENOUS | Qty: 10 | Status: AC

## 2017-03-24 NOTE — Evaluation (Signed)
Physical Therapy Evaluation Patient Details Name: Christian Garner MRN: [id number] DOB: Nov 05, 1945 Today's Date: 03/24/2017   History of Present Illness  Patient is a 71 y/o male who presents with dyspnea. CXR- showed what appeared to be a large right effusion with possible consolidation. ECG showed new atrial fibrillation. Recently admitted March 2017 with resp failure requiring intubation and AKI requiring CRRT. PMH includes CHF, EF 30-35%, CAD wth multi stents, DM, LE cellulitis.   Clinical Impression  Patient presents with generalized weakness, dyspnea on exertion, impaired balance and impaired mobility s/p above. Pt's SP02 ranged from mid 80s-90s on 3L/min 02 with constant cues for pursed lip breathing. Tolerated gait training with Min A for balance/safety. Pt seems confused at times. Pt recently admitted in march and does not seem to have  returned to new baseline. Lives with wife but reports falls. Would benefit from ST SNF to maximize independence and mobility prior to return home. Will follow acutely.    Follow Up Recommendations SNF;Supervision for mobility/OOB;Supervision/Assistance - 24 hour    Equipment Recommendations  None recommended by PT    Recommendations for Other Services OT consult     Precautions / Restrictions Precautions Precautions: Fall Precaution Comments: watch 02 Restrictions Weight Bearing Restrictions: No      Mobility  Bed Mobility               General bed mobility comments: Up in chair upon PT arrival.   Transfers Overall transfer level: Needs assistance Equipment used: Rolling walker (2 wheeled) Transfers: Sit to/from Stand Sit to Stand: Min assist         General transfer comment: Assist to power to standing with cues for hand placement/technique.   Ambulation/Gait Ambulation/Gait assistance: Min assist Ambulation Distance (Feet): 50 Feet Assistive device: Rolling walker (2 wheeled) Gait Pattern/deviations: Step-through  pattern;Decreased stride length;Step-to pattern;Trunk flexed;Shuffle Gait velocity: decreased Gait velocity interpretation: <1.8 ft/sec, indicative of risk for recurrent falls General Gait Details: Slow, unsteady gait with RW for support. Cues for RW management. SP02 ranged from mid 80s-90s on 3L/min 02. BP stable.d  Stairs            Wheelchair Mobility    Modified Rankin (Stroke Patients Only)       Balance Overall balance assessment: Needs assistance Sitting-balance support: Feet supported;No upper extremity supported Sitting balance-Leahy Scale: Fair     Standing balance support: During functional activity;Bilateral upper extremity supported Standing balance-Leahy Scale: Poor Standing balance comment: Reliant on BUEs for support in standing.                              Pertinent Vitals/Pain Pain Assessment: 0-10 Pain Score: 1  Pain Location: left hand Pain Descriptors / Indicators: Sore Pain Intervention(s): Monitored during session;Repositioned    Home Living Family/patient expects to be discharged to:: Private residence Living Arrangements: Spouse/significant other Available Help at Discharge: Family;Available 24 hours/day Type of Home: House Home Access: Stairs to enter Entrance Stairs-Rails: Right Entrance Stairs-Number of Steps: 2 Home Layout: One level Home Equipment: Walker - 2 wheels      Prior Function Level of Independence: Independent with assistive device(s)         Comments: Drives; used to work out at [employer] before prior admission.     Hand Dominance   Dominant Hand: Right    Extremity/Trunk Assessment   Upper Extremity Assessment Upper Extremity Assessment: Defer to OT evaluation    Lower Extremity Assessment Lower  Extremity Assessment: Generalized weakness       Communication   Communication: No difficulties  Cognition Arousal/Alertness: Awake/alert Behavior During Therapy: WFL for tasks  assessed/performed Overall Cognitive Status: No family/caregiver present to determine baseline cognitive functioning                                 General Comments: Seems less confused but not sure of this is baseline      General Comments General comments (skin integrity, edema, etc.): Sp02 ranged from mid 80s-90s on 3L/min 02.    Exercises     Assessment/Plan    PT Assessment Patient needs continued PT services  PT Problem List Decreased strength;Decreased mobility;Decreased activity tolerance;Decreased balance;Pain;Decreased range of motion;Decreased skin integrity;Cardiopulmonary status limiting activity;Decreased cognition;Decreased knowledge of use of DME       PT Treatment Interventions Therapeutic activities;Gait training;Therapeutic exercise;Patient/family education;Balance training;Functional mobility training;Stair training;Neuromuscular re-education;DME instruction    PT Goals (Current goals can be found in the Care Plan section)  Acute Rehab PT Goals Patient Stated Goal: to go home PT Goal Formulation: With patient Time For Goal Achievement: 04/07/17 Potential to Achieve Goals: Fair    Frequency Min 2X/week   Barriers to discharge Inaccessible home environment stairs to enter home    Co-evaluation               AM-PAC PT "6 Clicks" Daily Activity  Outcome Measure Difficulty turning over in bed (including adjusting bedclothes, sheets and blankets)?: None Difficulty moving from lying on back to sitting on the side of the bed? : None Difficulty sitting down on and standing up from a chair with arms (e.g., wheelchair, bedside commode, etc,.)?: Total Help needed moving to and from a bed to chair (including a wheelchair)?: A Little Help needed walking in hospital room?: A Little Help needed climbing 3-5 steps with a railing? : A Lot 6 Click Score: 17    End of Session Equipment Utilized During Treatment: Oxygen;Gait belt Activity  Tolerance: Patient tolerated treatment well Patient left: in chair;with call bell/phone within reach;with chair alarm set Nurse Communication: Mobility status PT Visit Diagnosis: Unsteadiness on feet (R26.81);Pain;Difficulty in walking, not elsewhere classified (R26.2);Muscle weakness (generalized) (M62.81) Pain - Right/Left: Left Pain - part of body: Hand    Time: 1027-2536 PT Time Calculation (min) (ACUTE ONLY): 40 min   Charges:   PT Evaluation $PT Eval Moderate Complexity: 1 Procedure PT Treatments $Gait Training: 8-22 mins $Therapeutic Activity: 8-22 mins   PT G Codes:        Christian Garner, PT, DPT [phone]    Christian Garner 03/24/2017, 3:08 PM

## 2017-03-24 NOTE — Progress Notes (Signed)
ANTICOAGULATION CONSULT NOTE - Follow Up Consult  Pharmacy Consult for Heparin Indication: atrial fibrillation  No Known Allergies  Patient Measurements: Height: 6\' 2"  (188 cm) Weight: 268 lb 4.8 oz (121.7 kg) IBW/kg (Calculated) : 82.2 Heparin Dosing Weight: 108kg  Vital Signs: Temp: 98.1 F (36.7 C) (06/13 0901) Temp Source: Oral (06/13 0901) BP: 105/89 (06/13 0901) Pulse Rate: 105 (06/13 0901)  Labs:  Recent Labs  03/22/17 0150 03/23/17 0357 03/23/17 1200 03/24/17 0404  HGB 11.8* 10.8*  --  11.0*  HCT 39.5 36.0*  --  35.8*  PLT 192 170  --  171  HEPARINUNFRC  --  0.43 0.56 0.56  CREATININE 1.61* 1.53*  --  1.33*    Estimated Creatinine Clearance: 70.6 mL/min (A) (by C-G formula based on SCr of 1.33 mg/dL (H)).   Medications:  Heparin @ 1400 units/hr  Assessment: 70yom continues on heparin for new afib. Heparin level is therapeutic at 0.56. CBC stable. No bleeding.  Goal of Therapy:  Heparin level 0.3-0.7 units/ml Monitor platelets by anticoagulation protocol: Yes   Plan:  1) Continue heparin at 1400 units/hr 2) Daily heparin level and CBC 3) Follow up transition to oral AC  Deboraha Sprang 03/24/2017,1:18 PM

## 2017-03-24 NOTE — Progress Notes (Signed)
Advanced Heart Failure Rounding Note   Subjective:    S/p thoracentesis 03/20/17 with 1.4 L out.   03/22/17 started on amio drip, heparin drip, and milrinone 0.25 mcg. Also diuresed with IV lasix. Sluggish response. PICC placed.   Coox 58.4% this am on milrinone 0.25 mcg/kg/min. CVP 13-14 still. Creatinine improving with diuresis.   Weight shows up 1 lb despite I/O negative 2.2 L (3.5 L of UO)  No SOB. Legs remain tender and swollen.   Echo 03/23/17 LVEF 30-35%, Severe LAE, Moderate RV dilation, Mod RAE  LHC 2017 at [place]- Multivessel CAD. Severe stenosis of the LAD Moderate stenosis of the Circumflex; No change from prior study. No restenosis of the RCA . Moderate, Severe global LV systolic dysfunction. LV ejection fraction is 25-30 % Interventional Summary Successful PCI / Xience Drug Eluting Stent of the mid Left Anterior Descending Coronary Artery.   Objective:   Weight Range:  Vital Signs:   Temp:  [97.8 F (36.6 C)-97.9 F (36.6 C)] 97.9 F (36.6 C) (06/13 0414) Pulse Rate:  [54-148] 97 (06/13 0600) Resp:  [14-21] 17 (06/13 0600) BP: (110-132)/(60-80) 110/66 (06/13 0600) SpO2:  [91 %-100 %] 96 % (06/13 0600) Weight:  [268 lb 4.8 oz (121.7 kg)] 268 lb 4.8 oz (121.7 kg) (06/13 0417) Last BM Date: 03/23/17 (per day RN)  Weight change: Filed Weights   03/22/17 0333 03/23/17 0327 03/24/17 0417  Weight: 270 lb 11.6 oz (122.8 kg) 267 lb 6.7 oz (121.3 kg) 268 lb 4.8 oz (121.7 kg)    Intake/Output:   Intake/Output Summary (Last 24 hours) at 03/24/17 0902 Last data filed at 03/24/17 0700  Gross per 24 hour  Intake          1344.67 ml  Output             3576 ml  Net         -2231.33 ml     Physical Exam: CVP 13-14 General: Elderly and chronically ill appearing. NAD sitting in chair.  HEENT: normal Neck: supple. JVP to jaw. Carotids 2+ bilat; no bruits. No thyromegaly or nodule noted. Cor: PMI nondisplaced. Irregularly irregular,  No M/G/R  noted Lungs: Diminished basilar sounds with scant crackles on 5 L 02.  Abdomen: soft, non-tender, distended, no HSM. No bruits or masses. +BS  Extremities: no cyanosis or clubbing. BLE with erythema and 2-3+ edema into thighs. L hand ecchymotic. LUE PICC site stable.  Neuro: alert & orientedx3, cranial nerves grossly intact. moves all 4 extremities w/o difficulty. Affect flat.   Telemetry: Personally reviewed, Afib 90-100s   Labs: Basic Metabolic Panel:  Recent Labs Lab 03/21/17 0435 03/21/17 1117 03/22/17 0150 03/23/17 0357 03/24/17 0404  NA 136 134* 132* 133* 133*  K 6.6* 6.2* 3.8 3.6 3.6  CL 99* 100* 94* 94* 92*  CO2 29 22 27 30 31   GLUCOSE 168* 240* 143* 254* 146*  BUN 82* 88* 78* 75* 64*  CREATININE 2.06* 1.93* 1.61* 1.53* 1.33*  CALCIUM 8.3* 8.3* 8.2* 8.0* 8.1*  MG  --   --  2.6*  --   --   PHOS  --   --  4.5  --   --     Liver Function Tests:  Recent Labs Lab 03/20/17 0030 03/20/17 1052 03/21/17 0435  AST 26  --  65*  ALT 26  --  8*  ALKPHOS 103  --  82  BILITOT 0.9  --  1.7*  PROT 7.5 7.1 6.2*  ALBUMIN 3.4*  --  3.0*   No results for input(s): LIPASE, AMYLASE in the last 168 hours. No results for input(s): AMMONIA in the last 168 hours.  CBC:  Recent Labs Lab 03/20/17 0030 03/22/17 0150 03/23/17 0357 03/24/17 0404  WBC 16.5* 14.0* 12.1* 11.8*  NEUTROABS 15.8*  --   --   --   HGB 13.0 11.8* 10.8* 11.0*  HCT 43.6 39.5 36.0* 35.8*  MCV 98.6 97.3 95.2 94.7  PLT 298 192 170 171    Cardiac Enzymes:  Recent Labs Lab 03/20/17 0030  TROPONINI 0.04*    BNP: BNP (last 3 results)  Recent Labs  12/26/16 0716 12/29/16 0355  BNP 412.4* 236.2*    ProBNP (last 3 results) No results for input(s): PROBNP in the last 8760 hours.    Other results:  Imaging: Dg Chest Port 1 View  Result Date: 03/22/2017 CLINICAL DATA:  post insertion to confirm placement EXAM: PORTABLE CHEST 1 VIEW COMPARISON:  Chest x-ray from earlier same day. FINDINGS:  Left-sided PICC line is been placed with tip well positioned at the level of the lower SVC/cavoatrial junction. Stable cardiomegaly. Atherosclerotic changes again noted at the aortic arch. Vague opacities are again seen at each lung base, right greater the left, atelectasis versus layering pleural effusions. At least mild interstitial edema bilaterally. IMPRESSION: 1. Left-sided PICC line appears well positioned with tip at the level of the lower SVC/cavoatrial junction. 2. No other change in the short-term interval. Probable small bilateral pleural effusions, right greater than left. Stable central pulmonary vascular congestion and interstitial edema. Stable cardiomegaly. 3. Aortic atherosclerosis. Electronically Signed   By: Franki Cabot M.D.   On: 03/22/2017 18:43     Medications:     Scheduled Medications: . aspirin EC  81 mg Oral QHS  . atorvastatin  40 mg Oral QHS  . clopidogrel  75 mg Oral QAC breakfast  . furosemide  80 mg Intravenous Q8H  . insulin aspart  0-20 Units Subcutaneous TID WC  . insulin aspart  0-5 Units Subcutaneous QHS  . insulin glargine  6 Units Subcutaneous Daily  . sodium chloride flush  10-40 mL Intracatheter Q12H  . spironolactone  12.5 mg Oral Daily    Infusions: . amiodarone 30 mg/hr (03/24/17 0322)  . heparin 1,400 Units/hr (03/23/17 2030)  . milrinone 0.25 mcg/kg/min (03/24/17 0004)    PRN Medications: acetaminophen **OR** acetaminophen, ondansetron **OR** ondansetron (ZOFRAN) IV, sodium chloride flush   Assessment/Plan/Discussion:   Mr Fagin is a42 year old admitted from [facility] with new onset A fib and A/C systolic heart failure.   1. A Fib RVR- This patients CHA2DS2-VASc Score 4 and unadjusted Ischemic Stroke Rate 4%.   New onset. Remains in A fib with rates in 90-100s - Continue amio at 30 mg per hour. Continue heparin. - Will need TEE/DC-CV at some point.   2. A/C Systolic Heart Failure- ECHO 12/2016 EF 30-35%. RV mildly dilated.  ICM.  - Most recent [place] 2017 with DES to LAD  - NYHA IIIb symptoms - Volume status remains elevated but slowly improving. Continue lasix 80 mg q 8 hrs  - Repeat 5 mg metolazone once.  - Increase spiro to 25 mg daily.  - Coox 58.4% this am on milrinone 0.25 mcg/kg/min.  - Will need to try and restore NSR once volume status improved.  - No BB with low output.   3. A/C Respiratory Failure On 5 liters oxygen. O2 sats stable currently.  3. AKI on CKD   -  Creatinine March 2018 was 1.26. Creatinine peaked 2.06.  - Creatinine trending down with diuresis.    4. Hyperkalemia - K has been running > 6. Received multiple doses of kayexylate.  - On spiro. Will continue to watch closely. Now hypokalemic with diuresis.  - Gently supp today.  5. R pleural Effusion- S/P Thoracentesis. 6. CAD- Multivessel CAD - DES LAD, mod stenosis circumflex. No bb for now. Continue asa and plavix. On statin.  7. LE cellulitis  - Continue rocephin per primary.   Cardiac rehab consulted. Continue to diurese.   Length of Stay: [place], [place]  03/24/2017, 9:02 AM  Advanced Heart Failure Team Pager [phone] (M-F; 7a - 4p)  Please contact [facility] for night-coverage after hours (4p -7a ) and weekends on [email or web address]  Patient seen and examined with the above-signed Advanced Practice Provider and/or Housestaff. I personally reviewed laboratory data, imaging studies and relevant notes. I independently examined the patient and formulated the important aspects of the plan. I have edited the note to reflect any of my changes or salient points. I have personally discussed the plan with the patient and/or family.  Remains very tenuous. Volume status still elevated despite aggressive IV diuresis. Will continue lasix 80 IV q8 and add metolazone. Co-ox marginal at 58%. Fortunately creatinine improving.   Remains in AF despite amio. Rate now better controlled. Continue heparin. Can start warfarin or DOAC. Will  discuss med options with CM. Plan TEE/DC-CV once more fully diuresed.   Echo reviewed personally EF 30-35%. CXR with resolved effusion.   Supp K.   Glori Bickers, MD  6:30 PM

## 2017-03-24 NOTE — Progress Notes (Signed)
[place] TEAM 1 - Stepdown/ICU TEAM  Christian Garner  MVE:[id number] DOB: 1946/01/31 DOA: 03/19/2017 PCP: [name], MD    Brief Narrative:  71 y.o. male with a history of chronic systolic CHF (EF 28%), home O2, CAD s/p PCI, CKD baseline Cr 1.3, and DM who presented with 3-4 days of progressive dyspnea accompanied by leg swelling.  In the ED at [name] an EKG noted atrial fibrillation w/ a HR of 115.  CXR noted a large R pleural effusion.  He was transferred to [facility].  Of note he had an admission to [facility] in March 2018 during which he required intubation as well as CRRT for acute renal failure w/ hyperkalemia and a large transudative pleural effusion.  Subjective: The patient is sleepy but is easily awakened.  He denies chest pain fevers chills nausea vomiting or abdominal pain.  Assessment & Plan:  Acute hypoxic and hypercapnic respiratory failure Due to volume overload in the setting of a CHF exacerbation + pleural effusion - continues to require significant oxygen supplementation  Newly appreciated atrial fibrillation with RVR Now on IV amiodarone and IV heparin - rate not ideal at present - further titration of medications per cardiology  Acute exacerbation of chronic systolic and diastolic congestive heart failure Ejection fraction 30% via TTE March 2018 w/ grade 2 DD - aggressive care as per CHF team - presently requiring milrinone   Filed Weights   03/22/17 0333 03/23/17 0327 03/24/17 0417  Weight: 122.8 kg (270 lb 11.6 oz) 121.3 kg (267 lb 6.7 oz) 121.7 kg (268 lb 4.8 oz)    Recurrent Right-sided pleural effusion Most likely related to CHF - ultrasound-guided thoracentesis yielding ~1.4L accomplished on 6/9 - f/u CXR in AM   Acute kidney injury on chronic kidney disease Creatinine 1.29 at time of discharge 01/05/17 - creatinine beginning to improve as more success and diuresis is being accomplished  Recent Labs Lab 03/21/17 0435 03/21/17 1117 03/22/17 0150  03/23/17 0357 03/24/17 0404  CREATININE 2.06* 1.93* 1.61* 1.53* 1.33*    Hyperkalemia Resolved  Acute urinary retention Foley in place - likely related to BPH - follow  DM2 No changes in treatment plan today  CAD with multiple stents DES 03/2016  Recent hand injury Reports has been evaluated and fracture ruled out - follow clinically - signif ecchymosis noted but w/ good radial pulse and use of hand - follow with patient now on IV heparin  Hematuria Was evaluated by Urology for same during prior hospital stay - follow with patient now on IV heparin   DVT prophylaxis: Lovenox Code Status: FULL CODE Family Communication: no family present at time of exam  Disposition Plan: SDU  Consultants:  CHF Team   Procedures: None  Antimicrobials:  Levaquin 6/8   Objective: Blood pressure 105/89, pulse (!) 105, temperature 98.1 F (36.7 C), temperature source Oral, resp. rate 17, height 6\' 2"  (1.88 m), weight 121.7 kg (268 lb 4.8 oz), SpO2 96 %.  Intake/Output Summary (Last 24 hours) at 03/24/17 1006 Last data filed at 03/24/17 0901  Gross per 24 hour  Intake          1605.13 ml  Output             3576 ml  Net         -1970.87 ml   Filed Weights   03/22/17 0333 03/23/17 0327 03/24/17 0417  Weight: 122.8 kg (270 lb 11.6 oz) 121.3 kg (267 lb 6.7 oz) 121.7 kg (268  lb 4.8 oz)    Examination: General:  No acute respiratory distress Lungs: Poor air movement bilateral bases - no wheezing Cardiovascular: Irregularly irregular - no murmur or rub Abdomen: Obese, soft, bowel sounds positive, no rebound Extremities: 3+ bilateral lower extremity edema persists  CBC:  Recent Labs Lab 03/20/17 0030 03/22/17 0150 03/23/17 0357 03/24/17 0404  WBC 16.5* 14.0* 12.1* 11.8*  NEUTROABS 15.8*  --   --   --   HGB 13.0 11.8* 10.8* 11.0*  HCT 43.6 39.5 36.0* 35.8*  MCV 98.6 97.3 95.2 94.7  PLT 298 192 170 270   Basic Metabolic Panel:  Recent Labs Lab 03/21/17 0435  03/21/17 1117 03/22/17 0150 03/23/17 0357 03/24/17 0404  NA 136 134* 132* 133* 133*  K 6.6* 6.2* 3.8 3.6 3.6  CL 99* 100* 94* 94* 92*  CO2 29 22 27 30 31   GLUCOSE 168* 240* 143* 254* 146*  BUN 82* 88* 78* 75* 64*  CREATININE 2.06* 1.93* 1.61* 1.53* 1.33*  CALCIUM 8.3* 8.3* 8.2* 8.0* 8.1*  MG  --   --  2.6*  --   --   PHOS  --   --  4.5  --   --    GFR: Estimated Creatinine Clearance: 70.6 mL/min (A) (by C-G formula based on SCr of 1.33 mg/dL (H)).  Liver Function Tests:  Recent Labs Lab 03/20/17 0030 03/20/17 1052 03/21/17 0435  AST 26  --  65*  ALT 26  --  8*  ALKPHOS 103  --  82  BILITOT 0.9  --  1.7*  PROT 7.5 7.1 6.2*  ALBUMIN 3.4*  --  3.0*    Cardiac Enzymes:  Recent Labs Lab 03/20/17 0030  TROPONINI 0.04*   CBG:  Recent Labs Lab 03/23/17 0807 03/23/17 1201 03/23/17 1544 03/23/17 2015 03/24/17 0833  GLUCAP 153* 194* 205* 247* 126*    Recent Results (from the past 240 hour(s))  MRSA PCR Screening     Status: None   Collection Time: 03/19/17 10:47 PM  Result Value Ref Range Status   MRSA by PCR NEGATIVE NEGATIVE Final    Comment:        The GeneXpert MRSA Assay (FDA approved for NASAL specimens only), is one component of a comprehensive MRSA colonization surveillance program. It is not intended to diagnose MRSA infection nor to guide or monitor treatment for MRSA infections.   Culture, blood (single)     Status: None (Preliminary result)   Collection Time: 03/20/17 12:30 AM  Result Value Ref Range Status   Specimen Description BLOOD LEFT ANTECUBITAL  Final   Special Requests   Final    BOTTLES DRAWN AEROBIC AND ANAEROBIC Blood Culture adequate volume   Culture NO GROWTH 3 DAYS  Final   Report Status PENDING  Incomplete  Culture, body fluid-bottle     Status: None (Preliminary result)   Collection Time: 03/20/17  2:46 PM  Result Value Ref Range Status   Specimen Description FLUID RIGHT PLEURAL  Final   Special Requests NONE  Final    Culture NO GROWTH 3 DAYS  Final   Report Status PENDING  Incomplete  Gram stain     Status: None   Collection Time: 03/20/17  2:46 PM  Result Value Ref Range Status   Specimen Description FLUID RIGHT PLEURAL  Final   Special Requests NONE  Final   Gram Stain   Final    RARE WBC PRESENT, PREDOMINANTLY MONONUCLEAR NO ORGANISMS SEEN    Report Status 03/20/2017 FINAL  Final     Scheduled Meds: . aspirin EC  81 mg Oral QHS  . atorvastatin  40 mg Oral QHS  . clopidogrel  75 mg Oral QAC breakfast  . furosemide  80 mg Intravenous Q8H  . insulin aspart  0-20 Units Subcutaneous TID WC  . insulin aspart  0-5 Units Subcutaneous QHS  . insulin glargine  6 Units Subcutaneous Daily  . metolazone  5 mg Oral Once  . potassium chloride  20 mEq Oral Once  . sodium chloride flush  10-40 mL Intracatheter Q12H  . [START ON 03/25/2017] spironolactone  25 mg Oral Daily     LOS: 5 days   [name], MD [facility] Office  [phone] Pager - Text Page per Shea Evans as per below:  On-Call/Text Page:      [email or web address]      password [email or web address]  If 7PM-7AM, please contact night-coverage [email or web address] Password [email or web address] 03/24/2017, 10:06 AM

## 2017-03-25 ENCOUNTER — Inpatient Hospital Stay (HOSPITAL_COMMUNITY): Payer: Medicare Other

## 2017-03-25 DIAGNOSIS — N182 Chronic kidney disease, stage 2 (mild): Secondary | ICD-10-CM

## 2017-03-25 DIAGNOSIS — I2510 Atherosclerotic heart disease of native coronary artery without angina pectoris: Secondary | ICD-10-CM

## 2017-03-25 DIAGNOSIS — E1165 Type 2 diabetes mellitus with hyperglycemia: Secondary | ICD-10-CM

## 2017-03-25 DIAGNOSIS — E1129 Type 2 diabetes mellitus with other diabetic kidney complication: Secondary | ICD-10-CM

## 2017-03-25 LAB — CULTURE, BODY FLUID W GRAM STAIN -BOTTLE

## 2017-03-25 LAB — CULTURE, BLOOD (SINGLE)
Culture: NO GROWTH
SPECIAL REQUESTS: ADEQUATE

## 2017-03-25 LAB — CBC
HCT: 34.7 % — ABNORMAL LOW (ref 39.0–52.0)
HEMOGLOBIN: 10.7 g/dL — AB (ref 13.0–17.0)
MCH: 29 pg (ref 26.0–34.0)
MCHC: 30.8 g/dL (ref 30.0–36.0)
MCV: 94 fL (ref 78.0–100.0)
Platelets: 160 10*3/uL (ref 150–400)
RBC: 3.69 MIL/uL — AB (ref 4.22–5.81)
RDW: 15.9 % — ABNORMAL HIGH (ref 11.5–15.5)
WBC: 12.8 10*3/uL — ABNORMAL HIGH (ref 4.0–10.5)

## 2017-03-25 LAB — COOXEMETRY PANEL
Carboxyhemoglobin: 1.6 % — ABNORMAL HIGH (ref 0.5–1.5)
METHEMOGLOBIN: 1.1 % (ref 0.0–1.5)
O2 Saturation: 51.4 %
TOTAL HEMOGLOBIN: 11 g/dL — AB (ref 12.0–16.0)

## 2017-03-25 LAB — CULTURE, BODY FLUID-BOTTLE: CULTURE: NO GROWTH

## 2017-03-25 LAB — HEMOGLOBIN A1C
HEMOGLOBIN A1C: 6.7 % — AB (ref 4.8–5.6)
MEAN PLASMA GLUCOSE: 146 mg/dL

## 2017-03-25 LAB — GLUCOSE, CAPILLARY
GLUCOSE-CAPILLARY: 222 mg/dL — AB (ref 65–99)
GLUCOSE-CAPILLARY: 210 mg/dL — AB (ref 65–99)
Glucose-Capillary: 163 mg/dL — ABNORMAL HIGH (ref 65–99)
Glucose-Capillary: 152 mg/dL — ABNORMAL HIGH (ref 65–99)

## 2017-03-25 LAB — HEPARIN LEVEL (UNFRACTIONATED)
HEPARIN UNFRACTIONATED: 0.35 [IU]/mL (ref 0.30–0.70)
Heparin Unfractionated: >2.2 IU/mL — ABNORMAL HIGH (ref 0.30–0.70)

## 2017-03-25 LAB — BASIC METABOLIC PANEL
ANION GAP: 8 (ref 5–15)
BUN: 53 mg/dL — AB (ref 6–20)
CALCIUM: 8.2 mg/dL — AB (ref 8.9–10.3)
CO2: 32 mmol/L (ref 22–32)
Chloride: 90 mmol/L — ABNORMAL LOW (ref 101–111)
Creatinine, Ser: 1.28 mg/dL — ABNORMAL HIGH (ref 0.61–1.24)
GFR calc Af Amer: >60 mL/min (ref >60)
GFR, EST NON AFRICAN AMERICAN: 55 mL/min — AB (ref >60)
GLUCOSE: 180 mg/dL — AB (ref 65–99)
POTASSIUM: 3.9 mmol/L (ref 3.5–5.1)
SODIUM: 130 mmol/L — AB (ref 135–145)

## 2017-03-25 MED ORDER — MILRINONE LACTATE IN DEXTROSE 20-5 MG/100ML-% IV SOLN
0.1250 ug/kg/min | INTRAVENOUS | Status: DC
  Administered 2017-03-25 – 2017-03-28 (×10): 0.375 ug/kg/min via INTRAVENOUS
  Administered 2017-03-29: 0.25 ug/kg/min via INTRAVENOUS
  Administered 2017-03-29: 0.125 ug/kg/min via INTRAVENOUS
  Filled 2017-03-25 (×14): qty 100

## 2017-03-25 MED ORDER — POTASSIUM CHLORIDE CRYS ER 20 MEQ PO TBCR
20.0000 meq | EXTENDED_RELEASE_TABLET | Freq: Once | ORAL | Status: AC
  Administered 2017-03-25: 20 meq via ORAL
  Filled 2017-03-25: qty 1

## 2017-03-25 MED ORDER — INSULIN GLARGINE 100 UNIT/ML ~~LOC~~ SOLN
10.0000 [IU] | Freq: Every day | SUBCUTANEOUS | Status: DC
  Administered 2017-03-26 – 2017-04-02 (×8): 10 [IU] via SUBCUTANEOUS
  Filled 2017-03-25 (×8): qty 0.1

## 2017-03-25 MED ORDER — METOLAZONE 5 MG PO TABS
5.0000 mg | ORAL_TABLET | Freq: Once | ORAL | Status: AC
  Administered 2017-03-25: 5 mg via ORAL
  Filled 2017-03-25: qty 1

## 2017-03-25 MED ORDER — WARFARIN SODIUM 10 MG PO TABS
10.0000 mg | ORAL_TABLET | Freq: Once | ORAL | Status: AC
  Administered 2017-03-25: 10 mg via ORAL
  Filled 2017-03-25: qty 1

## 2017-03-25 MED ORDER — COUMADIN BOOK
Freq: Once | Status: AC
  Administered 2017-03-25: 22:00:00
  Filled 2017-03-25: qty 1

## 2017-03-25 MED ORDER — ACETAZOLAMIDE 250 MG PO TABS
500.0000 mg | ORAL_TABLET | Freq: Two times a day (BID) | ORAL | Status: DC
  Administered 2017-03-25 – 2017-03-27 (×4): 500 mg via ORAL
  Filled 2017-03-25 (×5): qty 2

## 2017-03-25 MED ORDER — WARFARIN - PHARMACIST DOSING INPATIENT
Freq: Every day | Status: DC
  Administered 2017-03-29: 17:00:00

## 2017-03-25 NOTE — Progress Notes (Signed)
PROGRESS NOTE    Christian Garner  ZOX:[id number] DOB: 05/17/1946 DOA: 03/19/2017 PCP: [name], MD   Brief Narrative:  71 y.o.WM PMHx Chronic Systolic and Diastolic CHF (EF 81%), home O2, CAD s/p PCI, CKD baseline Cr 1.3, and DMwho presented with 3-4 days of progressive dyspnea accompanied by leg swelling.  In the ED at [facility] an EKG noted atrial fibrillation w/ a HR of 115.  CXR noted a large R pleural effusion.  He was transferred to [facility].  Of note he had an admission to [facility] in March 2018 during which he required intubation as well as CRRT for acute renal failure w/ hyperkalemia and a large transudative pleural effusion.   Subjective: 6/14   A/O 4, positive acute on chronic respiratory distress but improving per patient. Negative CP. Negative N/V. Unsure of his dry weight.    Assessment & Plan:   Principal Problem:   Acute on chronic respiratory failure with hypoxia and hypercapnia (HCC) Active Problems:   AKI (acute kidney injury) ([place])   Pleural effusion   Acute on chronic combined systolic and diastolic CHF (congestive heart failure) (HCC)   AF (paroxysmal atrial fibrillation) ([place])   Community acquired pneumonia   Pressure injury of skin   Acute on chronic hypoxic and hypercapnic respiratory failure -Appears to be primarily related to volume overload in the setting of a CHF exacerbation + pleural effusion   Newly appreciated atrial fibrillation with RVR(CHA2DS2-VASc Score 4) - Continue amio at 30 mg per hour.  - Remains on heparin. OK to start coumadin vs DOAC. Will discuss with MD and have pharmacist discuss options with pt. Will need case management to investigate cost if decides on DOAC.  - Will need TEE/DC-CV at some point  Acute exacerbation of chronic systolic and diastolic congestive heart failure -Ejection fraction 30% via TTE March 2018 w/ grade 2 DD -Diuresis per CHF team.  -Lasix 80 mg q 8 hr - Continue spiro 25 mg daily.  - Coox 51%  this am on milrinone 0.25 mcg/kg/min. Continue to diuresis.  - Will need to try and restore NSR once volume status improved.  - No BB with low output.    Recurrent Right-sided pleural effusion -Most likely related to CHF  - 6/9 ultrasound-guided thoracentesis yielding ~1.4L  Possible Right lower lobe pneumonia Procalcitonin not consistent with acute bacterial infection - discontinued antibiotics 6/9   Acute renal failure on CKD stage II (Creatinine 1.29 at discharge 01/05/17) Lab Results  Component Value Date   CREATININE 1.28 (H) 03/25/2017   CREATININE 1.33 (H) 03/24/2017   CREATININE 1.53 (H) 03/23/2017  -Continues to improve with diuresis   Hyperkalemia/Hypokalemia -With diuresis patient now hypokalemic.     Acute urinary retention Foley in place - likely related to BPH - follow  DM Type 2 Controlled with renal complication -1/91 Hemoglobin A1c= 6.7 -Lipid panel; within ADA guidelines -6/14 increase Lantus 10 units daily -Resistant SSI  CAD with multiple stents -DES 03/2016     DVT prophylaxis: Heparin drip ---> Coumadin or NOAC per cardiology Code Status: Full Family Communication: None Disposition Plan: Per cardiology   Consultants:  Cardiology CHF team  Procedures/Significant Events:  None   VENTILATOR SETTINGS: NA   Cultures   Antimicrobials: Anti-infectives    Start     Stop   03/20/17 2200  levofloxacin (LEVAQUIN) IVPB 500 mg  Status:  Discontinued     03/20/17 1108   03/20/17 0030  levofloxacin (LEVAQUIN) IVPB 750 mg  03/20/17 0215       Devices NA   LINES / TUBES:      Continuous Infusions: . amiodarone 30 mg/hr (03/25/17 0302)  . heparin 1,400 Units/hr (03/25/17 0315)  . milrinone 0.25 mcg/kg/min (03/25/17 0016)     Objective: Vitals:   03/24/17 2327 03/25/17 0000 03/25/17 0355 03/25/17 0706  BP: 106/65  107/65   Pulse: 90 (!) 108 87   Resp: 17 (!) 24 20 16   Temp: 97.2 F (36.2 C)  97.4 F (36.3 C)     TempSrc: Oral  Oral Oral  SpO2: 90% (!) 81% (!) 89% 97%  Weight:   268 lb (121.6 kg)   Height:        Intake/Output Summary (Last 24 hours) at 03/25/17 0714 Last data filed at 03/25/17 0400  Gross per 24 hour  Intake           1554.2 ml  Output             3300 ml  Net          -1745.8 ml   Filed Weights   03/23/17 0327 03/24/17 0417 03/25/17 0355  Weight: 267 lb 6.7 oz (121.3 kg) 268 lb 4.8 oz (121.7 kg) 268 lb (121.6 kg)    Examination:  General:A/O 4, positive acute on chronic respiratory distress Eyes: negative scleral hemorrhage, negative anisocoria, negative icterus ENT: Negative Runny nose, negative gingival bleeding, Neck:  Negative scars, masses, torticollis, lymphadenopathy, JVD Lungs: Clear to auscultation bilaterally without wheezes or crackles Cardiovascular: Regular rate and rhythm without murmur gallop or rub normal S1 and S2 Abdomen: Obese, negative abdominal pain, nondistended, positive soft, bowel sounds, no rebound, no ascites, no appreciable mass Extremities: positive cyanosis, positive bilateral lower extremity edema 2-3+ to thighs (softening), positive left arm/hand cyanosis  Skin: Negative rashes, lesions, ulcers Psychiatric:  Negative depression, negative anxiety, negative fatigue, negative mania  Central nervous system:  Cranial nerves II through XII intact, tongue/uvula midline, all extremities muscle strength 5/5, sensation intact throughout,  negative dysarthria, negative expressive aphasia, negative receptive aphasia.  .     Data Reviewed: Care during the described time interval was provided by me .  I have reviewed this patient's available data, including medical history, events of note, physical examination, and all test results as part of my evaluation. I have personally reviewed and interpreted all radiology studies.  CBC:  Recent Labs Lab 03/20/17 0030 03/22/17 0150 03/23/17 0357 03/24/17 0404 03/25/17 0440  WBC 16.5* 14.0* 12.1*  11.8* 12.8*  NEUTROABS 15.8*  --   --   --   --   HGB 13.0 11.8* 10.8* 11.0* 10.7*  HCT 43.6 39.5 36.0* 35.8* 34.7*  MCV 98.6 97.3 95.2 94.7 94.0  PLT 298 192 170 171 563   Basic Metabolic Panel:  Recent Labs Lab 03/21/17 1117 03/22/17 0150 03/23/17 0357 03/24/17 0404 03/25/17 0440  NA 134* 132* 133* 133* 130*  K 6.2* 3.8 3.6 3.6 3.9  CL 100* 94* 94* 92* 90*  CO2 22 27 30 31  32  GLUCOSE 240* 143* 254* 146* 180*  BUN 88* 78* 75* 64* 53*  CREATININE 1.93* 1.61* 1.53* 1.33* 1.28*  CALCIUM 8.3* 8.2* 8.0* 8.1* 8.2*  MG  --  2.6*  --   --   --   PHOS  --  4.5  --   --   --    GFR: Estimated Creatinine Clearance: 73.4 mL/min (A) (by C-G formula based on SCr of 1.28 mg/dL (H)). Liver Function  Tests:  Recent Labs Lab 03/20/17 0030 03/20/17 1052 03/21/17 0435  AST 26  --  65*  ALT 26  --  8*  ALKPHOS 103  --  82  BILITOT 0.9  --  1.7*  PROT 7.5 7.1 6.2*  ALBUMIN 3.4*  --  3.0*   No results for input(s): LIPASE, AMYLASE in the last 168 hours. No results for input(s): AMMONIA in the last 168 hours. Coagulation Profile: No results for input(s): INR, PROTIME in the last 168 hours. Cardiac Enzymes:  Recent Labs Lab 03/20/17 0030  TROPONINI 0.04*   BNP (last 3 results) No results for input(s): PROBNP in the last 8760 hours. HbA1C:  Recent Labs  03/24/17 0404  HGBA1C 6.7*   CBG:  Recent Labs Lab 03/23/17 2015 03/24/17 0833 03/24/17 1337 03/24/17 1601 03/24/17 2119  GLUCAP 247* 126* 207* 182* 199*   Lipid Profile:  Recent Labs  03/24/17 0404  CHOL 72  HDL 27*  LDLCALC 32  TRIG 63  CHOLHDL 2.7   Thyroid Function Tests: No results for input(s): TSH, T4TOTAL, FREET4, T3FREE, THYROIDAB in the last 72 hours. Anemia Panel: No results for input(s): VITAMINB12, FOLATE, FERRITIN, TIBC, IRON, RETICCTPCT in the last 72 hours. Urine analysis:    Component Value Date/Time   COLORURINE YELLOW 03/19/2017 2359   APPEARANCEUR HAZY (A) 03/19/2017 2359    LABSPEC 1.011 03/19/2017 2359   PHURINE 5.0 03/19/2017 2359   GLUCOSEU NEGATIVE 03/19/2017 2359   HGBUR LARGE (A) 03/19/2017 2359   BILIRUBINUR NEGATIVE 03/19/2017 2359   KETONESUR NEGATIVE 03/19/2017 2359   PROTEINUR NEGATIVE 03/19/2017 2359   NITRITE NEGATIVE 03/19/2017 2359   LEUKOCYTESUR NEGATIVE 03/19/2017 2359   Sepsis Labs: @LABRCNTIP (procalcitonin:4,lacticidven:4)  ) Recent Results (from the past 240 hour(s))  MRSA PCR Screening     Status: None   Collection Time: 03/19/17 10:47 PM  Result Value Ref Range Status   MRSA by PCR NEGATIVE NEGATIVE Final    Comment:        The GeneXpert MRSA Assay (FDA approved for NASAL specimens only), is one component of a comprehensive MRSA colonization surveillance program. It is not intended to diagnose MRSA infection nor to guide or monitor treatment for MRSA infections.   Culture, blood (single)     Status: None (Preliminary result)   Collection Time: 03/20/17 12:30 AM  Result Value Ref Range Status   Specimen Description BLOOD LEFT ANTECUBITAL  Final   Special Requests   Final    BOTTLES DRAWN AEROBIC AND ANAEROBIC Blood Culture adequate volume   Culture NO GROWTH 4 DAYS  Final   Report Status PENDING  Incomplete  Culture, body fluid-bottle     Status: None (Preliminary result)   Collection Time: 03/20/17  2:46 PM  Result Value Ref Range Status   Specimen Description FLUID RIGHT PLEURAL  Final   Special Requests NONE  Final   Culture NO GROWTH 4 DAYS  Final   Report Status PENDING  Incomplete  Gram stain     Status: None   Collection Time: 03/20/17  2:46 PM  Result Value Ref Range Status   Specimen Description FLUID RIGHT PLEURAL  Final   Special Requests NONE  Final   Gram Stain   Final    RARE WBC PRESENT, PREDOMINANTLY MONONUCLEAR NO ORGANISMS SEEN    Report Status 03/20/2017 FINAL  Final         Radiology Studies: No results found.      Scheduled Meds: . aspirin EC  81 mg Oral  QHS  . atorvastatin   40 mg Oral QHS  . clopidogrel  75 mg Oral QAC breakfast  . furosemide  80 mg Intravenous Q8H  . insulin aspart  0-20 Units Subcutaneous TID WC  . insulin aspart  0-5 Units Subcutaneous QHS  . insulin glargine  6 Units Subcutaneous Daily  . sodium chloride flush  10-40 mL Intracatheter Q12H  . spironolactone  25 mg Oral Daily   Continuous Infusions: . amiodarone 30 mg/hr (03/25/17 0302)  . heparin 1,400 Units/hr (03/25/17 0315)  . milrinone 0.25 mcg/kg/min (03/25/17 0016)     LOS: 6 days    Time spent: 40 minutes    Karlee Staff, Geraldo Docker, MD [facility] Pager [phone]   If 7PM-7AM, please contact night-coverage [email or web address] Password [name] 03/25/2017, 7:14 AM

## 2017-03-25 NOTE — Progress Notes (Signed)
ANTICOAGULATION CONSULT NOTE - Follow Up Consult  Pharmacy Consult for Heparin Indication: atrial fibrillation  No Known Allergies  Patient Measurements: Height: 6\' 2"  (188 cm) Weight: 268 lb (121.6 kg) IBW/kg (Calculated) : 82.2 Heparin Dosing Weight: 108kg  Vital Signs: Temp: 97.4 F (36.3 C) (06/14 0706) Temp Source: Oral (06/14 0706) BP: 128/104 (06/14 0706) Pulse Rate: 150 (06/14 0706)  Labs:  Recent Labs  03/23/17 0357  03/24/17 0404 03/25/17 0440 03/25/17 0730  HGB 10.8*  --  11.0* 10.7*  --   HCT 36.0*  --  35.8* 34.7*  --   PLT 170  --  171 160  --   HEPARINUNFRC 0.43  < > 0.56 >2.20* 0.35  CREATININE 1.53*  --  1.33* 1.28*  --   < > = values in this interval not displayed.  Estimated Creatinine Clearance: 73.4 mL/min (A) (by C-G formula based on SCr of 1.28 mg/dL (H)).   Medications:  Heparin @ 1400 units/hr  Assessment: 70yom continues on heparin for new afib. Heparin level is therapeutic at 0.3. CBC stable. No bleeding noted.   Copays on DOACs are around $280, patient was very adamant that medication costs be reasonable so coumadin is likely his preference. Have discussed this with HF team PA who will discuss with attending.  Goal of Therapy:  Heparin level 0.3-0.7 units/ml Monitor platelets by anticoagulation protocol: Yes   Plan:  1) Continue heparin at 1400 units/hr 2) Daily heparin level and CBC 3) Follow up transition to oral [facility]  Erin Hearing PharmD., BCPS Clinical Pharmacist Pager [phone] 03/25/2017 11:39 AM

## 2017-03-25 NOTE — Progress Notes (Signed)
Advanced Heart Failure Rounding Note   Subjective:    S/p thoracentesis 03/20/17 with 1.4 L out.   03/22/17 started on amio drip, heparin drip, and milrinone 0.25 mcg. Also diuresed with IV lasix. Sluggish response. PICC placed.   Coox 51.4% this am on milrinoe 0.25 mcg/kg/min. Creatinine continues to improve. Down to 1.28 today. CVP 10-11  Feeling better. Wants to go home.  Denies SOB getting around room. No CP. Denies lightheadedness or dizziness.   Weight unchanged despite negative 1.7 L (3.3 L of UO). Pt states he was stood up to weight this am.   Echo 03/23/17 LVEF 30-35%, Severe LAE, Moderate RV dilation, Mod RAE  LHC 2017 at [place]- Multivessel CAD. Severe stenosis of the LAD Moderate stenosis of the Circumflex; No change from prior study. No restenosis of the RCA . Moderate, Severe global LV systolic dysfunction. LV ejection fraction is 25-30 % Interventional Summary Successful PCI / Xience Drug Eluting Stent of the mid Left Anterior Descending Coronary Artery.   Objective:   Weight Range:  Vital Signs:   Temp:  [97.2 F (36.2 C)-98.1 F (36.7 C)] 97.4 F (36.3 C) (06/14 0355) Pulse Rate:  [78-108] 87 (06/14 0355) Resp:  [16-34] 16 (06/14 0706) BP: (87-109)/(57-89) 107/65 (06/14 0355) SpO2:  [81 %-97 %] 97 % (06/14 0706) Weight:  [268 lb (121.6 kg)] 268 lb (121.6 kg) (06/14 0355) Last BM Date: 03/23/17  Weight change: Filed Weights   03/23/17 0327 03/24/17 0417 03/25/17 0355  Weight: 267 lb 6.7 oz (121.3 kg) 268 lb 4.8 oz (121.7 kg) 268 lb (121.6 kg)    Intake/Output:   Intake/Output Summary (Last 24 hours) at 03/25/17 0753 Last data filed at 03/25/17 0400  Gross per 24 hour  Intake           1554.2 ml  Output             3300 ml  Net          -1745.8 ml     Physical Exam: CVP 10-11 General: Elderly and chronically ill appearing. No resp difficulty. HEENT: Normal Neck: supple. JVP to jaw. Carotids 2+ bilat; no bruits. No thyromegaly  or nodule noted. Cor: PMI nondisplaced. Irregular irregular, No M/G/R noted Lungs: Diminished basilar sounds Abdomen: soft, non-tender, distended, no HSM. No bruits or masses. +BS  Extremities: no cyanosis, clubbing, or rash. 2-3 BLE edema with clear drainage and bullae.  LUE PICC site stable.  Neuro: alert & orientedx3, cranial nerves grossly intact. moves all 4 extremities w/o difficulty. Affect flat but appropriate.   Telemetry: Personally reviewed, Afib 90s   Labs: Basic Metabolic Panel:  Recent Labs Lab 03/21/17 1117 03/22/17 0150 03/23/17 0357 03/24/17 0404 03/25/17 0440  NA 134* 132* 133* 133* 130*  K 6.2* 3.8 3.6 3.6 3.9  CL 100* 94* 94* 92* 90*  CO2 22 27 30 31  32  GLUCOSE 240* 143* 254* 146* 180*  BUN 88* 78* 75* 64* 53*  CREATININE 1.93* 1.61* 1.53* 1.33* 1.28*  CALCIUM 8.3* 8.2* 8.0* 8.1* 8.2*  MG  --  2.6*  --   --   --   PHOS  --  4.5  --   --   --     Liver Function Tests:  Recent Labs Lab 03/20/17 0030 03/20/17 1052 03/21/17 0435  AST 26  --  65*  ALT 26  --  8*  ALKPHOS 103  --  82  BILITOT 0.9  --  1.7*  PROT 7.5 7.1  6.2*  ALBUMIN 3.4*  --  3.0*   No results for input(s): LIPASE, AMYLASE in the last 168 hours. No results for input(s): AMMONIA in the last 168 hours.  CBC:  Recent Labs Lab 03/20/17 0030 03/22/17 0150 03/23/17 0357 03/24/17 0404 03/25/17 0440  WBC 16.5* 14.0* 12.1* 11.8* 12.8*  NEUTROABS 15.8*  --   --   --   --   HGB 13.0 11.8* 10.8* 11.0* 10.7*  HCT 43.6 39.5 36.0* 35.8* 34.7*  MCV 98.6 97.3 95.2 94.7 94.0  PLT 298 192 170 171 160    Cardiac Enzymes:  Recent Labs Lab 03/20/17 0030  TROPONINI 0.04*    BNP: BNP (last 3 results)  Recent Labs  12/26/16 0716 12/29/16 0355  BNP 412.4* 236.2*    ProBNP (last 3 results) No results for input(s): PROBNP in the last 8760 hours.    Other results:  Imaging: Dg Chest Port 1 View  Result Date: 03/25/2017 CLINICAL DATA:  Patient admitted 03/19/2017 with a  history of chronic congestive heart failure and shortness of breath. Lower extremity swelling. EXAM: PORTABLE CHEST 1 VIEW COMPARISON:  Single-view of the chest 03/22/2017. PA and lateral chest 03/20/2017. FINDINGS: Right pleural effusion and basilar airspace disease have increased. Smaller left pleural effusion and basilar airspace disease appear mildly improved. No pneumothorax. There is cardiomegaly and vascular congestion. IMPRESSION: Improved scratch the increased right effusion and basilar airspace disease. Much smaller left effusion basilar airspace disease appear improved. Electronically Signed   By: Inge Rise M.D.   On: 03/25/2017 07:44     Medications:     Scheduled Medications: . aspirin EC  81 mg Oral QHS  . atorvastatin  40 mg Oral QHS  . clopidogrel  75 mg Oral QAC breakfast  . furosemide  80 mg Intravenous Q8H  . insulin aspart  0-20 Units Subcutaneous TID WC  . insulin aspart  0-5 Units Subcutaneous QHS  . insulin glargine  6 Units Subcutaneous Daily  . sodium chloride flush  10-40 mL Intracatheter Q12H  . spironolactone  25 mg Oral Daily    Infusions: . amiodarone 30 mg/hr (03/25/17 0302)  . heparin 1,400 Units/hr (03/25/17 0315)  . milrinone 0.25 mcg/kg/min (03/25/17 0016)    PRN Medications: acetaminophen **OR** acetaminophen, ondansetron **OR** ondansetron (ZOFRAN) IV, sodium chloride flush   Assessment/Plan/Discussion:   Mr Weare is a49 year old admitted from [facility] with new onset A fib and A/C systolic heart failure.   1. A Fib RVR- This patients CHA2DS2-VASc Score 4 and unadjusted Ischemic Stroke Rate 4%.   New onset. Remains in Afib with rates in 90s.  - Continue amio at 30 mg per hour.  - Remains on heparin. OK to start coumadin vs DOAC. Will discuss with MD and have pharmacist discuss options with pt. Will need case management to investigate cost if decides on DOAC.  - Will need TEE/DC-CV at some point.   - CHA2DS2/VAS Stroke Risk  Points  Is at least 4 (CHF, Vascular disease, Age 74, and DM) 2. A/C Systolic Heart Failure- ECHO 12/2016 EF 30-35%. RV mildly dilated. ICM.  - Most recent [place] 2017 with DES to LAD  - NYHA IIIb symptoms - Volume state remains elevated. Continue lasix 80 mg q 8 hrs  - Repeat 5 mg metoalzone.  - Continue spiro 25 mg daily.  - Coox 51% this am on milrinone 0.25 mcg/kg/min. Continue to diuresis.  - Will need to try and restore NSR once volume status improved.  -  No BB with low output.   3. A/C Respiratory Failure On 5 liters oxygen. O2 sats stable currently.  3. AKI on CKD   -Creatinine March 2018 was 1.26. Creatinine peaked 2.06.  - Creatinine back to baseline with diuresis. Continue to follow.  4. Hyperkalemia - Resolved. - On spiro. Now hypokalemic with diuresis. Continue to follow.  - Gently supp with metolazone 5. R pleural Effusion- S/P Thoracentesis. 6. CAD- Multivessel CAD - DES LAD, mod stenosis circumflex. No bb for now. Continue asa and plavix. On statin. No change.  7. LE cellulitis  - Continue rocephin per primary. No change.   Cardiac rehab consulted. Continue to diurese.   Length of Stay: [address]  Annamaria Helling  03/25/2017, 7:53 AM  Advanced Heart Failure Team Pager [phone] (M-F; 7a - 4p)  Please contact [facility] for night-coverage after hours (4p -7a ) and weekends on [email or web address]  Patient seen and examined with the above-signed Advanced Practice Provider and/or Housestaff. I personally reviewed laboratory data, imaging studies and relevant notes. I independently examined the patient and formulated the important aspects of the plan. I have edited the note to reflect any of my changes or salient points. I have personally discussed the plan with the patient and/or family.  Minimal progress. Volume status still elevated and co-ox low. Will increase milrinone to 0.375 and also add diamox 500 bid.   Will need to attempt to restore NSR. Unfortunately next spot  for TEE and DCCV not until next Tuesday. Will make NPO after midnight and try to move up to tomorrow if possible.  Cellulitis improving with ceftriaxone.  Will wrap legs.   Glori Bickers, MD  6:58 PM

## 2017-03-25 NOTE — Care Management (Signed)
NOAC Benefit check 1. XARELTO 15 MG DAILY   COVER- YES  CO-PAY- $ 284.03 Q/L 1 PER DAY  TIER- 3 DRUG  PRIOR APPROVAL- NO   2. XARELTO 20 MG DAILY   COVER- YES  CO-PAY- 284.03  Q/ L 1 PER DAY  TIER- 3 DRUG  PRIOR APPROVAL- NO   3. ELIQUIS 2.5 MG BID   COVER- YES  CO-PAY- $ 284.03 Q/L 2 PER DAY  TIER- 3 DRUG  PRIOR APPROVAL- NO   4. ELIQUIS 5 MG BID   COVER0 YES  CO-PAY- $ 284.03 Q/L 2 PER DAY  TIER- 3 DRUG  PRIOR APPROVAL-NO   DEDUCTIBLE NOT MET   PREFERRED PHARMACY : [facility]

## 2017-03-25 NOTE — Progress Notes (Signed)
pt has converted to NSR at 1930 and has remained NSR since. Will continue to monitor.

## 2017-03-25 NOTE — Progress Notes (Signed)
Orthopedic Tech Progress Note Patient Details:  Christian Garner January 18, 1946 [id number]  Ortho Devices Type of Ortho Device: Louretta Parma boot Ortho Device/Splint Location: (B) LE Ortho Device/Splint Interventions: Ordered, Application   Braulio Bosch 03/25/2017, 5:42 PM

## 2017-03-25 NOTE — Progress Notes (Signed)
Orthopedic Tech Progress Note Patient Details:  Christian Garner 11-07-1945 [id number]  Ortho Devices Type of Ortho Device: Ace wrap Ortho Device/Splint Location: (B) LE Ortho Device/Splint Interventions: Ordered   Braulio Bosch 03/25/2017, 7:55 PM

## 2017-03-25 NOTE — Progress Notes (Signed)
ANTICOAGULATION CONSULT NOTE - Follow Up Consult  Pharmacy Consult for Heparin / Warfarin Indication: atrial fibrillation  No Known Allergies  Patient Measurements: Height: 6\' 2"  (188 cm) Weight: 268 lb (121.6 kg) IBW/kg (Calculated) : 82.2 Heparin Dosing Weight: 108kg  Vital Signs: Temp: 97.4 F (36.3 C) (06/14 0706) Temp Source: Oral (06/14 0706) BP: 128/104 (06/14 0706) Pulse Rate: 150 (06/14 0706)  Labs:  Recent Labs  03/23/17 0357  03/24/17 0404 03/25/17 0440 03/25/17 0730  HGB 10.8*  --  11.0* 10.7*  --   HCT 36.0*  --  35.8* 34.7*  --   PLT 170  --  171 160  --   HEPARINUNFRC 0.43  < > 0.56 >2.20* 0.35  CREATININE 1.53*  --  1.33* 1.28*  --   < > = values in this interval not displayed.  Estimated Creatinine Clearance: 73.4 mL/min (A) (by C-G formula based on SCr of 1.28 mg/dL (H)).   Medications:  Heparin @ 1400 units/hr  Assessment: 70yom continues on heparin for new afib. Heparin level is therapeutic at 0.3. CBC stable. No bleeding noted.   Copays on DOACs are around $280, patient was very adamant that medication costs be reasonable so coumadin is likely his preference. Have discussed this with HF team PA who will discuss with attending.  Starting warfarin this evenings   Goal of Therapy:  Heparin level 0.3-0.7 units/ml Monitor platelets by anticoagulation protocol: Yes   Plan:  1) Continue heparin at 1400 units/hr 2) Warfarin 10 mg po x 1 dose tonight 3) Daily labs  Thank you Anette Guarneri, PharmD [phone] 03/25/2017 4:06 PM

## 2017-03-26 DIAGNOSIS — E1121 Type 2 diabetes mellitus with diabetic nephropathy: Secondary | ICD-10-CM

## 2017-03-26 DIAGNOSIS — E871 Hypo-osmolality and hyponatremia: Secondary | ICD-10-CM

## 2017-03-26 LAB — HEPARIN LEVEL (UNFRACTIONATED)
HEPARIN UNFRACTIONATED: 0.23 [IU]/mL — AB (ref 0.30–0.70)
HEPARIN UNFRACTIONATED: 0.36 [IU]/mL (ref 0.30–0.70)

## 2017-03-26 LAB — CBC
HCT: 36.9 % — ABNORMAL LOW (ref 39.0–52.0)
Hemoglobin: 11.4 g/dL — ABNORMAL LOW (ref 13.0–17.0)
MCH: 28.9 pg (ref 26.0–34.0)
MCHC: 30.9 g/dL (ref 30.0–36.0)
MCV: 93.7 fL (ref 78.0–100.0)
PLATELETS: 184 10*3/uL (ref 150–400)
RBC: 3.94 MIL/uL — ABNORMAL LOW (ref 4.22–5.81)
RDW: 16 % — AB (ref 11.5–15.5)
WBC: 13.5 10*3/uL — AB (ref 4.0–10.5)

## 2017-03-26 LAB — BASIC METABOLIC PANEL
Anion gap: 10 (ref 5–15)
BUN: 48 mg/dL — AB (ref 6–20)
CALCIUM: 8.9 mg/dL (ref 8.9–10.3)
CO2: 31 mmol/L (ref 22–32)
CREATININE: 1.29 mg/dL — AB (ref 0.61–1.24)
Chloride: 88 mmol/L — ABNORMAL LOW (ref 101–111)
GFR calc Af Amer: >60 mL/min (ref >60)
GFR, EST NON AFRICAN AMERICAN: 54 mL/min — AB (ref >60)
GLUCOSE: 159 mg/dL — AB (ref 65–99)
Potassium: 4.6 mmol/L (ref 3.5–5.1)
SODIUM: 129 mmol/L — AB (ref 135–145)

## 2017-03-26 LAB — PROTIME-INR
INR: 1.15
Prothrombin Time: 14.7 seconds (ref 11.4–15.2)

## 2017-03-26 LAB — GLUCOSE, CAPILLARY
GLUCOSE-CAPILLARY: 164 mg/dL — AB (ref 65–99)
Glucose-Capillary: 227 mg/dL — ABNORMAL HIGH (ref 65–99)
Glucose-Capillary: 175 mg/dL — ABNORMAL HIGH (ref 65–99)
Glucose-Capillary: 191 mg/dL — ABNORMAL HIGH (ref 65–99)

## 2017-03-26 LAB — COOXEMETRY PANEL
CARBOXYHEMOGLOBIN: 1.9 % — AB (ref 0.5–1.5)
METHEMOGLOBIN: 1 % (ref 0.0–1.5)
O2 SAT: 65.3 %
TOTAL HEMOGLOBIN: 10.6 g/dL — AB (ref 12.0–16.0)

## 2017-03-26 MED ORDER — METOLAZONE 5 MG PO TABS
5.0000 mg | ORAL_TABLET | Freq: Once | ORAL | Status: AC
  Administered 2017-03-26: 5 mg via ORAL
  Filled 2017-03-26: qty 1

## 2017-03-26 MED ORDER — WARFARIN SODIUM 10 MG PO TABS
10.0000 mg | ORAL_TABLET | Freq: Once | ORAL | Status: AC
  Administered 2017-03-26: 10 mg via ORAL
  Filled 2017-03-26: qty 1

## 2017-03-26 MED ORDER — INSULIN ASPART 100 UNIT/ML ~~LOC~~ SOLN
5.0000 [IU] | Freq: Three times a day (TID) | SUBCUTANEOUS | Status: DC
  Administered 2017-03-27 – 2017-03-29 (×8): 5 [IU] via SUBCUTANEOUS

## 2017-03-26 NOTE — Progress Notes (Signed)
ANTICOAGULATION CONSULT NOTE - Follow Up Consult  Pharmacy Consult for Heparin / Warfarin Indication: atrial fibrillation  No Known Allergies  Patient Measurements: Height: 6\' 2"  (188 cm) Weight: 268 lb (121.6 kg) IBW/kg (Calculated) : 82.2 Heparin Dosing Weight: 108kg  Vital Signs: Temp: 97.7 F (36.5 C) (06/15 0005) Temp Source: Oral (06/15 0005) BP: 114/69 (06/15 0005) Pulse Rate: 88 (06/14 1825)  Labs:  Recent Labs  03/23/17 0357  03/24/17 0404 03/25/17 0440 03/25/17 0730 03/26/17 0254  HGB 10.8*  --  11.0* 10.7*  --  11.4*  HCT 36.0*  --  35.8* 34.7*  --  36.9*  PLT 170  --  171 160  --  184  LABPROT  --   --   --   --   --  14.7  INR  --   --   --   --   --  1.15  HEPARINUNFRC 0.43  < > 0.56 >2.20* 0.35 0.23*  CREATININE 1.53*  --  1.33* 1.28*  --   --   < > = values in this interval not displayed.  Estimated Creatinine Clearance: 73.4 mL/min (A) (by C-G formula based on SCr of 1.28 mg/dL (H)).  Assessment: 70yom continues on heparin for new afib. Heparin level down to subtherapeutic (0.23) on gtt at 1400 units/hr. CBC stable. No issues with line or bleeding reported per RN.  Goal of Therapy:  Heparin level 0.3-0.7 units/ml Monitor platelets by anticoagulation protocol: Yes   Plan:  Increase heparin to 1600 units/hr Will f/u 8 hr heparin level  Thank you Sherlon Handing, PharmD, BCPS Clinical pharmacist, pager [phone] 03/26/2017 3:39 AM

## 2017-03-26 NOTE — Progress Notes (Signed)
Advanced Heart Failure Rounding Note   Subjective:    S/p thoracentesis 03/20/17 with 1.4 L out.   03/22/17 started on amio drip, heparin drip, and milrinone 0.25 mcg. Also diuresed with IV lasix. Sluggish response. PICC placed.   Coox 65.3% this am on milrinone 0.375 mcg/kg/min. Diuresed with lasix + metolazone + diamox yesterday. Negative 2.3 L and down 4 lbs. CVP 6-7  Feeling good this morning. Very exciting to not need DCCV. Hungry.  Breathing improving, but remains very swollen in his legs.   By telemetry, pt converted to NSR ~ 1900 03/25/17.  Echo 03/23/17 LVEF 30-35%, Severe LAE, Moderate RV dilation, Mod RAE  LHC 2017 at [place]- Multivessel CAD. Severe stenosis of the LAD Moderate stenosis of the Circumflex; No change from prior study. No restenosis of the RCA . Moderate, Severe global LV systolic dysfunction. LV ejection fraction is 25-30 % Interventional Summary Successful PCI / Xience Drug Eluting Stent of the mid Left Anterior Descending Coronary Artery.   Objective:   Weight Range:  Vital Signs:   Temp:  [97.6 F (36.4 C)-98.1 F (36.7 C)] 97.8 F (36.6 C) (06/15 0749) Pulse Rate:  [80-88] 80 (06/15 0749) Resp:  [20-25] 20 (06/15 0749) BP: (96-123)/(52-69) 96/61 (06/15 0749) SpO2:  [79 %-92 %] 79 % (06/15 0749) Weight:  [264 lb 12.8 oz (120.1 kg)] 264 lb 12.8 oz (120.1 kg) (06/15 0348) Last BM Date: 03/23/17  Weight change: Filed Weights   03/24/17 0417 03/25/17 0355 03/26/17 0348  Weight: 268 lb 4.8 oz (121.7 kg) 268 lb (121.6 kg) 264 lb 12.8 oz (120.1 kg)    Intake/Output:   Intake/Output Summary (Last 24 hours) at 03/26/17 0803 Last data filed at 03/26/17 0700  Gross per 24 hour  Intake          1534.19 ml  Output             3900 ml  Net         -2365.81 ml     Physical Exam: CVP 6-7 General: Elderly and chronically ill appearing. No resp difficulty. HEENT: Normal Neck: supple. JVP ~?8-9 Carotids 2+ bilat; no bruits. No  thyromegaly or nodule noted. Cor: PMI nondisplaced. Regular. No M/G/R noted Lungs: Diminished basilar sounds with scant crackles.  Abdomen: soft, non-tender, distended, no HSM. No bruits or masses. +BS  Extremities: no cyanosis or clubbing. 2-3+ BLE edema with UNNA boots. LUE PICC site stable.  Neuro: alert & orientedx3, cranial nerves grossly intact. moves all 4 extremities w/o difficulty. Affect pleasant this am.   Telemetry: Personally reviewed, NSR 80-90s Converted from Afib ~ 1900 last night.   Labs: Basic Metabolic Panel:  Recent Labs Lab 03/22/17 0150 03/23/17 0357 03/24/17 0404 03/25/17 0440 03/26/17 0254  NA 132* 133* 133* 130* 129*  K 3.8 3.6 3.6 3.9 4.6  CL 94* 94* 92* 90* 88*  CO2 27 30 31  32 31  GLUCOSE 143* 254* 146* 180* 159*  BUN 78* 75* 64* 53* 48*  CREATININE 1.61* 1.53* 1.33* 1.28* 1.29*  CALCIUM 8.2* 8.0* 8.1* 8.2* 8.9  MG 2.6*  --   --   --   --   PHOS 4.5  --   --   --   --     Liver Function Tests:  Recent Labs Lab 03/20/17 0030 03/20/17 1052 03/21/17 0435  AST 26  --  65*  ALT 26  --  8*  ALKPHOS 103  --  82  BILITOT 0.9  --  1.7*  PROT 7.5 7.1 6.2*  ALBUMIN 3.4*  --  3.0*   No results for input(s): LIPASE, AMYLASE in the last 168 hours. No results for input(s): AMMONIA in the last 168 hours.  CBC:  Recent Labs Lab 03/20/17 0030 03/22/17 0150 03/23/17 0357 03/24/17 0404 03/25/17 0440 03/26/17 0254  WBC 16.5* 14.0* 12.1* 11.8* 12.8* 13.5*  NEUTROABS 15.8*  --   --   --   --   --   HGB 13.0 11.8* 10.8* 11.0* 10.7* 11.4*  HCT 43.6 39.5 36.0* 35.8* 34.7* 36.9*  MCV 98.6 97.3 95.2 94.7 94.0 93.7  PLT 298 192 170 171 160 184    Cardiac Enzymes:  Recent Labs Lab 03/20/17 0030  TROPONINI 0.04*    BNP: BNP (last 3 results)  Recent Labs  12/26/16 0716 12/29/16 0355  BNP 412.4* 236.2*    ProBNP (last 3 results) No results for input(s): PROBNP in the last 8760 hours.    Other results:  Imaging: Dg Chest Port 1  View  Result Date: 03/25/2017 CLINICAL DATA:  Patient admitted 03/19/2017 with a history of chronic congestive heart failure and shortness of breath. Lower extremity swelling. EXAM: PORTABLE CHEST 1 VIEW COMPARISON:  Single-view of the chest 03/22/2017. PA and lateral chest 03/20/2017. FINDINGS: Right pleural effusion and basilar airspace disease have increased. Smaller left pleural effusion and basilar airspace disease appear mildly improved. No pneumothorax. There is cardiomegaly and vascular congestion. IMPRESSION: Improved scratch the increased right effusion and basilar airspace disease. Much smaller left effusion basilar airspace disease appear improved. Electronically Signed   By: Inge Rise M.D.   On: 03/25/2017 07:44     Medications:     Scheduled Medications: . acetaZOLAMIDE  500 mg Oral BID  . aspirin EC  81 mg Oral QHS  . atorvastatin  40 mg Oral QHS  . clopidogrel  75 mg Oral QAC breakfast  . furosemide  80 mg Intravenous Q8H  . insulin aspart  0-20 Units Subcutaneous TID WC  . insulin aspart  0-5 Units Subcutaneous QHS  . insulin glargine  10 Units Subcutaneous Daily  . sodium chloride flush  10-40 mL Intracatheter Q12H  . spironolactone  25 mg Oral Daily  . Warfarin - Pharmacist Dosing Inpatient   Does not apply q1800    Infusions: . amiodarone 30 mg/hr (03/26/17 0535)  . heparin 1,600 Units/hr (03/26/17 0355)  . milrinone 0.375 mcg/kg/min (03/26/17 0510)    PRN Medications: acetaminophen **OR** acetaminophen, ondansetron **OR** ondansetron (ZOFRAN) IV, sodium chloride flush   Assessment/Plan/Discussion:   Mr Pilot is a71 year old admitted from [name] with new onset A fib and A/C systolic heart failure.   1. A Fib RVR- This patients CHA2DS2-VASc Score 4 and unadjusted Ischemic Stroke Rate 4%.   New onset.  - Continue amio at 30 mg per hour. Converted to NSR around 1900 03/25/17. Will discuss load with Pharm-D.  - Remains on heparin. Started  on coumadin last night. INR 1.15 this am. (DOACs too expensive at $280).  - CHA2DS2/VAS Stroke Risk Points  Is at least 4 (CHF, Vascular disease, Age 71, and DM) 2. A/C Systolic Heart Failure- ECHO 12/2016 EF 30-35%. RV mildly dilated. ICM.  - Most recent Diablock 2017 with DES to LAD  - NYHA IIIb symptoms - Volume status remains elevated but improving. Will continue current diuresis today. Continue lasix 80 mg q 8 hrs and metolazone 5 mg once. Has persistent, marked peripheral edema. ? Third spacing. Creatinine stable - Continue  diamox 500 mg BID.  - Continue spiro 25 mg daily.  - Coox 65% this am on milrinone 0.375 mcg/kg/min. Continue to diuresis.  - No BB with low output.   3. A/C Respiratory Failure On 5 liters oxygen. O2 sats stable currently.  3. AKI on CKD   -Creatinine March 2018 was 1.26. Creatinine peaked 2.06.  - Creatinine stable with diuresis. Continue to follow closely.  4. Hyperkalemia - Resolved. - Now improved and on spiro. Continue to follow closely.  5. R pleural Effusion- S/P Thoracentesis. 6. CAD- Multivessel CAD - DES LAD, mod stenosis circumflex. No bb for now. Continue asa and plavix. On statin. No change.  7. LE cellulitis  - Continue rocephin per primary. Improving slowly.  8. Deconditioning - PT and CR consulted. Last visit 03/24/17.  Mobilize.   Will remain in-house at least until INR therapeutic.  Continue to diurese.   Length of Stay: [place], [place]  03/26/2017, 8:03 AM  Advanced Heart Failure Team Pager [phone] (M-F; 7a - 4p)  Please contact [facility] for night-coverage after hours (4p -7a ) and weekends on [email or web address]  Patient seen and examined with the above-signed Advanced Practice Provider and/or Housestaff. I personally reviewed laboratory data, imaging studies and relevant notes. I independently examined the patient and formulated the important aspects of the plan. I have edited the note to reflect any of my changes or salient  points. I have personally discussed the plan with the patient and/or family.  Diuresis remained sluggish yesterday with marginal co-ox and persistent severe volume overload. Milrinone increased and diamox added. Diuresis much improved overnight. Also converted back to NSR on IV amio.   Remains overloaded. Will continue IV diuresis. Watch renal function closely. Hopefully with restoration of NSR we will be able to wean inotropes. Legs now wrapped with UNNA boots.   Load warfarin (discussed with PharmD). Cellulitis improved. Hyponatremia persists. Continue free H2O restriction.   Glori Bickers, MD  11:43 PM

## 2017-03-26 NOTE — Progress Notes (Signed)
Dr. Mliss Sax from cards made aware of pts 17bt run of SVT. Pt asymptomatic at the time, remains in NAD. Will continue to monitor.

## 2017-03-26 NOTE — Progress Notes (Signed)
PROGRESS NOTE    Christian Garner  WLN:[id number] DOB: Nov 02, 1945 DOA: 03/19/2017 PCP: [name], MD   Brief Narrative:  71 y.o.WM PMHx Chronic Systolic and Diastolic CHF (EF 74%), home O2, CAD s/p PCI, CKD baseline Cr 1.3, and DMwho presented with 3-4 days of progressive dyspnea accompanied by leg swelling.  In the ED at [facility] an EKG noted atrial fibrillation w/ a HR of 115.  CXR noted a large R pleural effusion.  He was transferred to [facility].  Of note he had an admission to [facility] in March 2018 during which he required intubation as well as CRRT for acute renal failure w/ hyperkalemia and a large transudative pleural effusion.   Subjective: 6/15   A/O 4, positive acute on chronic respiratory distress continues to improve per patient. Negative CP. Negative N/V. Unsure of his dry weight.    Assessment & Plan:   Principal Problem:   Acute on chronic respiratory failure with hypoxia and hypercapnia (HCC) Active Problems:   AKI (acute kidney injury) ([place])   Pleural effusion   Acute on chronic combined systolic and diastolic CHF (congestive heart failure) (HCC)   AF (paroxysmal atrial fibrillation) ([place])   Community acquired pneumonia   Pressure injury of skin   Acute on chronic hypoxic and hypercapnic respiratory failure -Appears to be primarily related to volume overload in the setting of a CHF exacerbation + pleural effusion  -Resolving. Now on 3 L O2 (may be baseline)  Newly appreciated atrial fibrillation with RVR(CHA2DS2-VASc Score 4) - Continue Amio gtt per cardiology  - Patient now in NSR appears will not require Will need TEE/DC-CV   Acute exacerbation of chronic systolic and diastolic congestive heart failure -Echocardiogram: Ejection fraction 30% -35% see results below  -Diuresis per CHF team.  -Lasix 80 mg q 8 hr - Continue spiro 25 mg daily.  - No BB with low output.    Recurrent Right-sided pleural effusion -Most likely related to CHF  - 6/9  ultrasound-guided thoracentesis yielding ~1.4L  Possible Right lower lobe pneumonia Procalcitonin not consistent with acute bacterial infection - discontinued antibiotics 6/9   Acute renal failure on CKD stage II (Creatinine 1.29 at discharge 01/05/17) Lab Results  Component Value Date   CREATININE 1.29 (H) 03/26/2017   CREATININE 1.28 (H) 03/25/2017   CREATININE 1.33 (H) 03/24/2017  -Continues to improve with diuresis   Hyperkalemia/Hypokalemia -With diuresis patient now hypokalemic.     Acute urinary retention Foley in place - likely related to BPH - follow  DM Type 2 Controlled with renal complication -0/81 Hemoglobin A1c= 6.7 -Lipid panel; within ADA guidelines -6/14 increase Lantus 10 units daily -Resistant SSI -NovoLog 5 units QAC  CAD with multiple stents -DES 03/2016     DVT prophylaxis: Heparin drip ---> Coumadin  Code Status: Full Family Communication: None Disposition Plan: Per cardiology   Consultants:  Cardiology CHF team  Procedures/Significant Events:  6/12 Echocardiogram:Left ventricle: Inferior and posterior lateral hypokinesis Severe LVH.  -LVEF =30% to 35%. - Mitral valve:  mild to moderate regurgitation. - Left atrium: severely dilated.-- Right ventricle:  moderately dilated. - Right atrium:  moderately dilated. - RV is dialted with septal flattening consisant wtih cor pulmonale.   VENTILATOR SETTINGS: NA   Cultures   Antimicrobials: Anti-infectives    Start     Stop   03/20/17 2200  levofloxacin (LEVAQUIN) IVPB 500 mg  Status:  Discontinued     03/20/17 1108   03/20/17 0030  levofloxacin (LEVAQUIN) IVPB 750  mg     03/20/17 0215       Devices NA   LINES / TUBES:      Continuous Infusions: . amiodarone 30 mg/hr (03/26/17 0535)  . heparin 1,600 Units/hr (03/26/17 0355)  . milrinone 0.375 mcg/kg/min (03/26/17 0510)     Objective: Vitals:   03/26/17 0005 03/26/17 0348 03/26/17 0400 03/26/17 0749  BP: 114/69  108/67 (!) 123/52 96/61  Pulse:   81 80  Resp:   20 20  Temp: 97.7 F (36.5 C) 97.8 F (36.6 C)  97.8 F (36.6 C)  TempSrc: Oral Oral  Oral  SpO2:   (!) 88% (!) 79%  Weight:  264 lb 12.8 oz (120.1 kg)    Height:        Intake/Output Summary (Last 24 hours) at 03/26/17 2683 Last data filed at 03/26/17 0700  Gross per 24 hour  Intake          1534.19 ml  Output             3900 ml  Net         -2365.81 ml   Filed Weights   03/24/17 0417 03/25/17 0355 03/26/17 0348  Weight: 268 lb 4.8 oz (121.7 kg) 268 lb (121.6 kg) 264 lb 12.8 oz (120.1 kg)    Examination:  General:A/O 4, positive acute on chronic respiratory distress(Improving) Eyes: negative scleral hemorrhage, negative anisocoria, negative icterus ENT: Negative Runny nose, negative gingival bleeding, Neck:  Negative scars, masses, torticollis, lymphadenopathy, JVD Lungs: Clear to auscultation bilaterally without wheezes or crackles Cardiovascular: Regular rate and rhythm without murmur gallop or rub normal S1 and S2 Abdomen: Obese, negative abdominal pain, nondistended, positive soft, bowel sounds, no rebound, no ascites, no appreciable mass Extremities: positive cyanosis, positive bilateral lower extremity edema 2-3+ to thighs (softening), positive left arm/hand cyanosis, bilateral Unna boots in place  Skin: Negative rashes, lesions, ulcers Psychiatric:  Negative depression, negative anxiety, negative fatigue, negative mania  Central nervous system:  Cranial nerves II through XII intact, tongue/uvula midline, all extremities muscle strength 5/5, sensation intact throughout,  negative dysarthria, negative expressive aphasia, negative receptive aphasia.  .     Data Reviewed: Care during the described time interval was provided by me .  I have reviewed this patient's available data, including medical history, events of note, physical examination, and all test results as part of my evaluation. I have personally reviewed and  interpreted all radiology studies.  CBC:  Recent Labs Lab 03/20/17 0030 03/22/17 0150 03/23/17 0357 03/24/17 0404 03/25/17 0440 03/26/17 0254  WBC 16.5* 14.0* 12.1* 11.8* 12.8* 13.5*  NEUTROABS 15.8*  --   --   --   --   --   HGB 13.0 11.8* 10.8* 11.0* 10.7* 11.4*  HCT 43.6 39.5 36.0* 35.8* 34.7* 36.9*  MCV 98.6 97.3 95.2 94.7 94.0 93.7  PLT 298 192 170 171 160 419   Basic Metabolic Panel:  Recent Labs Lab 03/22/17 0150 03/23/17 0357 03/24/17 0404 03/25/17 0440 03/26/17 0254  NA 132* 133* 133* 130* 129*  K 3.8 3.6 3.6 3.9 4.6  CL 94* 94* 92* 90* 88*  CO2 27 30 31  32 31  GLUCOSE 143* 254* 146* 180* 159*  BUN 78* 75* 64* 53* 48*  CREATININE 1.61* 1.53* 1.33* 1.28* 1.29*  CALCIUM 8.2* 8.0* 8.1* 8.2* 8.9  MG 2.6*  --   --   --   --   PHOS 4.5  --   --   --   --  GFR: Estimated Creatinine Clearance: 72.4 mL/min (A) (by C-G formula based on SCr of 1.29 mg/dL (H)). Liver Function Tests:  Recent Labs Lab 03/20/17 0030 03/20/17 1052 03/21/17 0435  AST 26  --  65*  ALT 26  --  8*  ALKPHOS 103  --  82  BILITOT 0.9  --  1.7*  PROT 7.5 7.1 6.2*  ALBUMIN 3.4*  --  3.0*   No results for input(s): LIPASE, AMYLASE in the last 168 hours. No results for input(s): AMMONIA in the last 168 hours. Coagulation Profile:  Recent Labs Lab 03/26/17 0254  INR 1.15   Cardiac Enzymes:  Recent Labs Lab 03/20/17 0030  TROPONINI 0.04*   BNP (last 3 results) No results for input(s): PROBNP in the last 8760 hours. HbA1C:  Recent Labs  03/24/17 0404  HGBA1C 6.7*   CBG:  Recent Labs Lab 03/25/17 0751 03/25/17 1337 03/25/17 1823 03/25/17 2001 03/26/17 0747  GLUCAP 163* 222* 152* 210* 164*   Lipid Profile:  Recent Labs  03/24/17 0404  CHOL 72  HDL 27*  LDLCALC 32  TRIG 63  CHOLHDL 2.7   Thyroid Function Tests: No results for input(s): TSH, T4TOTAL, FREET4, T3FREE, THYROIDAB in the last 72 hours. Anemia Panel: No results for input(s): VITAMINB12,  FOLATE, FERRITIN, TIBC, IRON, RETICCTPCT in the last 72 hours. Urine analysis:    Component Value Date/Time   COLORURINE YELLOW 03/19/2017 2359   APPEARANCEUR HAZY (A) 03/19/2017 2359   LABSPEC 1.011 03/19/2017 2359   PHURINE 5.0 03/19/2017 2359   GLUCOSEU NEGATIVE 03/19/2017 2359   HGBUR LARGE (A) 03/19/2017 2359   BILIRUBINUR NEGATIVE 03/19/2017 2359   KETONESUR NEGATIVE 03/19/2017 2359   PROTEINUR NEGATIVE 03/19/2017 2359   NITRITE NEGATIVE 03/19/2017 2359   LEUKOCYTESUR NEGATIVE 03/19/2017 2359   Sepsis Labs: @LABRCNTIP (procalcitonin:4,lacticidven:4)  ) Recent Results (from the past 240 hour(s))  MRSA PCR Screening     Status: None   Collection Time: 03/19/17 10:47 PM  Result Value Ref Range Status   MRSA by PCR NEGATIVE NEGATIVE Final    Comment:        The GeneXpert MRSA Assay (FDA approved for NASAL specimens only), is one component of a comprehensive MRSA colonization surveillance program. It is not intended to diagnose MRSA infection nor to guide or monitor treatment for MRSA infections.   Culture, blood (single)     Status: None   Collection Time: 03/20/17 12:30 AM  Result Value Ref Range Status   Specimen Description BLOOD LEFT ANTECUBITAL  Final   Special Requests   Final    BOTTLES DRAWN AEROBIC AND ANAEROBIC Blood Culture adequate volume   Culture NO GROWTH 5 DAYS  Final   Report Status 03/25/2017 FINAL  Final  Culture, body fluid-bottle     Status: None   Collection Time: 03/20/17  2:46 PM  Result Value Ref Range Status   Specimen Description FLUID RIGHT PLEURAL  Final   Special Requests NONE  Final   Culture NO GROWTH 5 DAYS  Final   Report Status 03/25/2017 FINAL  Final  Gram stain     Status: None   Collection Time: 03/20/17  2:46 PM  Result Value Ref Range Status   Specimen Description FLUID RIGHT PLEURAL  Final   Special Requests NONE  Final   Gram Stain   Final    RARE WBC PRESENT, PREDOMINANTLY MONONUCLEAR NO ORGANISMS SEEN    Report  Status 03/20/2017 FINAL  Final         Radiology  Studies: Dg Chest Port 1 View  Result Date: 03/25/2017 CLINICAL DATA:  Patient admitted 03/19/2017 with a history of chronic congestive heart failure and shortness of breath. Lower extremity swelling. EXAM: PORTABLE CHEST 1 VIEW COMPARISON:  Single-view of the chest 03/22/2017. PA and lateral chest 03/20/2017. FINDINGS: Right pleural effusion and basilar airspace disease have increased. Smaller left pleural effusion and basilar airspace disease appear mildly improved. No pneumothorax. There is cardiomegaly and vascular congestion. IMPRESSION: Improved scratch the increased right effusion and basilar airspace disease. Much smaller left effusion basilar airspace disease appear improved. Electronically Signed   By: Inge Rise M.D.   On: 03/25/2017 07:44        Scheduled Meds: . acetaZOLAMIDE  500 mg Oral BID  . aspirin EC  81 mg Oral QHS  . atorvastatin  40 mg Oral QHS  . clopidogrel  75 mg Oral QAC breakfast  . furosemide  80 mg Intravenous Q8H  . insulin aspart  0-20 Units Subcutaneous TID WC  . insulin aspart  0-5 Units Subcutaneous QHS  . insulin glargine  10 Units Subcutaneous Daily  . sodium chloride flush  10-40 mL Intracatheter Q12H  . spironolactone  25 mg Oral Daily  . Warfarin - Pharmacist Dosing Inpatient   Does not apply q1800   Continuous Infusions: . amiodarone 30 mg/hr (03/26/17 0535)  . heparin 1,600 Units/hr (03/26/17 0355)  . milrinone 0.375 mcg/kg/min (03/26/17 0510)     LOS: 7 days    Time spent: 40 minutes    WOODS, Geraldo Docker, MD [facility] Pager [phone]   If 7PM-7AM, please contact night-coverage [email or web address] Password [facility] 03/26/2017, 8:23 AM

## 2017-03-26 NOTE — Progress Notes (Signed)
ANTICOAGULATION CONSULT NOTE - Follow Up Consult  Pharmacy Consult for Heparin / Warfarin Indication: atrial fibrillation  No Known Allergies  Patient Measurements: Height: 6\' 2"  (188 cm) Weight: 264 lb 12.8 oz (120.1 kg) IBW/kg (Calculated) : 82.2 Heparin Dosing Weight: 108kg  Vital Signs: Temp: 97.7 F (36.5 C) (06/15 1255) Temp Source: Oral (06/15 1255) BP: 97/66 (06/15 1255) Pulse Rate: 83 (06/15 1255)  Labs:  Recent Labs  03/24/17 0404 03/25/17 0440 03/25/17 0730 03/26/17 0254 03/26/17 1214  HGB 11.0* 10.7*  --  11.4*  --   HCT 35.8* 34.7*  --  36.9*  --   PLT 171 160  --  184  --   LABPROT  --   --   --  14.7  --   INR  --   --   --  1.15  --   HEPARINUNFRC 0.56 >2.20* 0.35 0.23* 0.36  CREATININE 1.33* 1.28*  --  1.29*  --     Estimated Creatinine Clearance: 72.4 mL/min (A) (by C-G formula based on SCr of 1.29 mg/dL (H)).  Assessment: 70yom continues on heparin for new afib. Heparin level at goal (0.36) on gtt at 1600 units/hr. CBC stable. No issues with line or bleeding reported per RN.  Warfarin started 6/14, INR 1.1 this am, will repeat 10mg  tonight.  Goal of Therapy:  Heparin level 0.3-0.7 units/ml Monitor platelets by anticoagulation protocol: Yes   Plan:  Continue heparin at 1600 units/hr Daily Heparin level, cbc, INR Repeat warfarin 10mg  tonight  Thank you, Erin Hearing PharmD., BCPS Clinical Pharmacist Pager [phone] 03/26/2017 1:27 PM

## 2017-03-27 ENCOUNTER — Inpatient Hospital Stay (HOSPITAL_COMMUNITY): Payer: Medicare Other

## 2017-03-27 LAB — CBC
HCT: 37.6 % — ABNORMAL LOW (ref 39.0–52.0)
HEMATOCRIT: 38.8 % — AB (ref 39.0–52.0)
Hemoglobin: 11.5 g/dL — ABNORMAL LOW (ref 13.0–17.0)
Hemoglobin: 11.8 g/dL — ABNORMAL LOW (ref 13.0–17.0)
MCH: 28.8 pg (ref 26.0–34.0)
MCH: 28.6 pg (ref 26.0–34.0)
MCHC: 30.6 g/dL (ref 30.0–36.0)
MCHC: 30.4 g/dL (ref 30.0–36.0)
MCV: 94 fL (ref 78.0–100.0)
MCV: 94.2 fL (ref 78.0–100.0)
PLATELETS: 213 10*3/uL (ref 150–400)
PLATELETS: 234 10*3/uL (ref 150–400)
RBC: 4 MIL/uL — AB (ref 4.22–5.81)
RBC: 4.12 MIL/uL — ABNORMAL LOW (ref 4.22–5.81)
RDW: 15.9 % — AB (ref 11.5–15.5)
RDW: 15.9 % — AB (ref 11.5–15.5)
WBC: 13.9 10*3/uL — ABNORMAL HIGH (ref 4.0–10.5)
WBC: 14 10*3/uL — ABNORMAL HIGH (ref 4.0–10.5)

## 2017-03-27 LAB — BASIC METABOLIC PANEL
ANION GAP: 13 (ref 5–15)
ANION GAP: 13 (ref 5–15)
BUN: 41 mg/dL — AB (ref 6–20)
BUN: 41 mg/dL — ABNORMAL HIGH (ref 6–20)
CALCIUM: 9 mg/dL (ref 8.9–10.3)
CALCIUM: 9.2 mg/dL (ref 8.9–10.3)
CHLORIDE: 85 mmol/L — AB (ref 101–111)
CO2: 33 mmol/L — ABNORMAL HIGH (ref 22–32)
CO2: 33 mmol/L — AB (ref 22–32)
CREATININE: 1.51 mg/dL — AB (ref 0.61–1.24)
Chloride: 86 mmol/L — ABNORMAL LOW (ref 101–111)
Creatinine, Ser: 1.47 mg/dL — ABNORMAL HIGH (ref 0.61–1.24)
GFR calc Af Amer: 54 mL/min — ABNORMAL LOW (ref >60)
GFR calc non Af Amer: 45 mL/min — ABNORMAL LOW (ref >60)
GFR, EST AFRICAN AMERICAN: 52 mL/min — AB (ref >60)
GFR, EST NON AFRICAN AMERICAN: 46 mL/min — AB (ref >60)
GLUCOSE: 148 mg/dL — AB (ref 65–99)
Glucose, Bld: 141 mg/dL — ABNORMAL HIGH (ref 65–99)
POTASSIUM: 4.4 mmol/L (ref 3.5–5.1)
Potassium: 4.2 mmol/L (ref 3.5–5.1)
SODIUM: 132 mmol/L — AB (ref 135–145)
SODIUM: 131 mmol/L — AB (ref 135–145)

## 2017-03-27 LAB — GLUCOSE, CAPILLARY
GLUCOSE-CAPILLARY: 137 mg/dL — AB (ref 65–99)
GLUCOSE-CAPILLARY: 174 mg/dL — AB (ref 65–99)
GLUCOSE-CAPILLARY: 178 mg/dL — AB (ref 65–99)
Glucose-Capillary: 187 mg/dL — ABNORMAL HIGH (ref 65–99)

## 2017-03-27 LAB — COOXEMETRY PANEL
Carboxyhemoglobin: 2 % — ABNORMAL HIGH (ref 0.5–1.5)
Methemoglobin: 0.8 % (ref 0.0–1.5)
O2 Saturation: 46.9 %
Total hemoglobin: 11.9 g/dL — ABNORMAL LOW (ref 12.0–16.0)

## 2017-03-27 LAB — PROTIME-INR
INR: 1.34
INR: 1.32
PROTHROMBIN TIME: 16.7 s — AB (ref 11.4–15.2)
PROTHROMBIN TIME: 16.5 s — AB (ref 11.4–15.2)

## 2017-03-27 LAB — HEPARIN LEVEL (UNFRACTIONATED)
HEPARIN UNFRACTIONATED: 0.52 [IU]/mL (ref 0.30–0.70)
Heparin Unfractionated: 0.27 IU/mL — ABNORMAL LOW (ref 0.30–0.70)

## 2017-03-27 MED ORDER — WARFARIN SODIUM 7.5 MG PO TABS
7.5000 mg | ORAL_TABLET | Freq: Once | ORAL | Status: AC
  Administered 2017-03-27: 7.5 mg via ORAL
  Filled 2017-03-27: qty 1

## 2017-03-27 NOTE — Progress Notes (Signed)
CARDIAC REHAB PHASE I   PRE:  Rate/Rhythm: 86 sinus rhythm  BP:  Supine:   Sitting: 104/75  Standing:    SaO2: 94% 3 L  MODE:  Ambulation: 230  ft   POST:  Rate/Rhythem: 101 sinus rhythm  BP:  Supine:   Sitting: 124/68   Standing:    SaO2: 78% ra up to 92% 3L  Pt ambulated in hallway x 1 assist using rolling walker.  Quick pace to return to bed due to fatigue.   Pt took 3 standing rest break.  Pt returned to bed, call light in place. Pt incontinent of stool,  pericare perfomed, barrier cream applied.  Pt given low sodium education including importance of reading labels and 1500 mg NA diet.  Handouts given. Pt also given fluid restriction guidelines, 1871ml daily.  Pt reports he frequently eats higher NA processed foods and drinks several glasses of juice daily.  Pt states "I know I wont be able to eat like this at home."  Pt states he is doing daily weights. Pt instructed to notify office if weight increased 3lb one day 3-5lb in one week.  Pt fatigued after walk, quickly dozed off for nap.  SNF recommended due to deconditioning and continued education for NA and fluid restrictions.  [place], RN, BSN Cardiac Pulmonary Rehab   [facility]

## 2017-03-27 NOTE — Progress Notes (Signed)
ANTICOAGULATION CONSULT NOTE - Follow Up Consult  Pharmacy Consult for Heparin/Coumadin Indication: atrial fibrillation  No Known Allergies  Patient Measurements: Height: 6\' 2"  (188 cm) Weight: 256 lb 9.6 oz (116.4 kg) IBW/kg (Calculated) : 82.2 Heparin Dosing Weight:   Vital Signs: Temp: 97.7 F (36.5 C) (06/16 1402) Temp Source: Oral (06/16 1402) BP: 119/70 (06/16 1402) Pulse Rate: 82 (06/16 1402)  Labs:  Recent Labs  03/26/17 0254 03/26/17 1214 03/27/17 0332 03/27/17 0355 03/27/17 1510  HGB 11.4*  --  11.8* 11.5*  --   HCT 36.9*  --  38.8* 37.6*  --   PLT 184  --  234 213  --   LABPROT 14.7  --  16.5* 16.7*  --   INR 1.15  --  1.32 1.34  --   HEPARINUNFRC 0.23* 0.36 0.27*  --  0.52  CREATININE 1.29*  --  1.51* 1.47*  --     Estimated Creatinine Clearance: 62.5 mL/min (A) (by C-G formula based on SCr of 1.47 mg/dL (H)).   Assessment: Anticoag: on IV heparin for new afib. DOAC copay $280, started warfarin 6/14. HL 0.27 slightly low this AM. INR 1.15>1.34. CBC stable.Watch INR on amiodarone. HL 0.52 now in goal - Thoracentesis 6/9  Goal of Therapy:  Heparin level 0.3-0.7 units/ml Monitor platelets by anticoagulation protocol: Yes   Plan:  - Continue Heparin at 1800 units/hr - Daily heparin level and CBC, INR - Warfarin 7.5 mg tonight   Darsha Zumstein S. Alford Highland, PharmD, BCPS Clinical Staff Pharmacist Pager [phone]  Eilene Ghazi Stillinger 03/27/2017,4:22 PM

## 2017-03-27 NOTE — Progress Notes (Signed)
PROGRESS NOTE    Christian Garner  UVO:[id number] DOB: October 06, 1946 DOA: 03/19/2017 PCP: [name], MD   Brief Narrative:  71 y.o.WM PMHx Chronic Systolic and Diastolic CHF (EF 74%), home O2, CAD s/p PCI, CKD baseline Cr 1.3, and DMwho presented with 3-4 days of progressive dyspnea accompanied by leg swelling.  In the ED at [facility] an EKG noted atrial fibrillation w/ a HR of 115.  CXR noted a large R pleural effusion.  He was transferred to [facility].  Of note he had an admission to [facility] in March 2018 during which he required intubation as well as CRRT for acute renal failure w/ hyperkalemia and a large transudative pleural effusion.   Subjective: 6/16 patient sleeping comfortably in bed, does arouse to stimulation and allows exam.  positive acute on chronic respiratory distress.Negative CP. Negative N/V.     Assessment & Plan:   Principal Problem:   Acute on chronic respiratory failure with hypoxia and hypercapnia (HCC) Active Problems:   AKI (acute kidney injury) ([place])   Pleural effusion   Acute on chronic combined systolic and diastolic CHF (congestive heart failure) (HCC)   AF (paroxysmal atrial fibrillation) ([place])   Community acquired pneumonia   Pressure injury of skin   Hyponatremia   Acute on chronic hypoxic and hypercapnic respiratory failure -Appears to be primarily related to volume overload in the setting of a CHF exacerbation + pleural effusion  -Resolving. Now on 3 L O2 (may be baseline)  Newly appreciated atrial fibrillation with RVR(CHA2DS2-VASc Score 4) - Continue Amio gtt per cardiology  - Patient now in NSR appears will not require TEE/DC-CV  -Coumadin per pharmacy Recent Labs Lab 03/26/17 0254 03/27/17 0332 03/27/17 0355  INR 1.15 1.32 1.34    Acute on chronic systolic and diastolic congestive heart failure -Echocardiogram: Ejection fraction 30% -35% see results below  -Diuresis per CHF team.  -Lasix 80 mg q 8 hr - Continue spiro 25 mg  daily.  - No BB with low output.   -LCSW and Palliative care consulted to discuss short-term/long-term care options when patient discharged  CAD with multiple stents -DES 03/2016  Recurrent Right-sided pleural effusion -Most likely related to CHF  - 6/9 ultrasound-guided thoracentesis yielding ~1.4L  Possible Right lower lobe pneumonia Procalcitonin not consistent with acute bacterial infection - discontinued antibiotics 6/9   Acute renal failure on CKD stage II (Creatinine 1.29 at discharge 01/05/17) Lab Results  Component Value Date   CREATININE 1.47 (H) 03/27/2017   CREATININE 1.51 (H) 03/27/2017   CREATININE 1.29 (H) 03/26/2017  -Continues to improve with diuresis   Hyperkalemia/Hypokalemia -With diuresis patient now hypokalemic.    Acute urinary retention Foley in place - likely related to BPH - follow  DM Type 2 Controlled with renal complication -2/59 Hemoglobin A1c= 6.7 -Lipid panel; within ADA guidelines -Lantus 10 units daily -NovoLog 5 units QAC -Resistant SSI      DVT prophylaxis: Heparin drip ---> Coumadin  Code Status: Full Family Communication: None Disposition Plan: Per cardiology   Consultants:  Cardiology CHF team  Procedures/Significant Events:  6/12 Echocardiogram:Left ventricle: Inferior and posterior lateral hypokinesis Severe LVH.  -LVEF =30% to 35%. - Mitral valve:  mild to moderate regurgitation. - Left atrium: severely dilated.-- Right ventricle:  moderately dilated. - Right atrium:  moderately dilated. - RV is dialted with septal flattening consisant wtih cor pulmonale.   VENTILATOR SETTINGS: NA   Cultures   Antimicrobials: Anti-infectives    Start     Stop  03/20/17 2200  levofloxacin (LEVAQUIN) IVPB 500 mg  Status:  Discontinued     03/20/17 1108   03/20/17 0030  levofloxacin (LEVAQUIN) IVPB 750 mg     03/20/17 0215       Devices NA   LINES / TUBES:      Continuous Infusions: . amiodarone 30 mg/hr  (03/26/17 2148)  . heparin 1,800 Units/hr (03/27/17 0534)  . milrinone 0.375 mcg/kg/min (03/27/17 0329)     Objective: Vitals:   03/27/17 0318 03/27/17 0400 03/27/17 0542 03/27/17 0753  BP: 116/88 113/64  106/71  Pulse: 88 79  81  Resp: 18 20  20   Temp: 98.1 F (36.7 C)   98.3 F (36.8 C)  TempSrc: Oral   Oral  SpO2: 93% 93%  (!) 87%  Weight:   256 lb 9.6 oz (116.4 kg)   Height:        Intake/Output Summary (Last 24 hours) at 03/27/17 0814 Last data filed at 03/27/17 0700  Gross per 24 hour  Intake          2320.68 ml  Output             7875 ml  Net         -5554.32 ml   Filed Weights   03/25/17 0355 03/26/17 0348 03/27/17 0542  Weight: 268 lb (121.6 kg) 264 lb 12.8 oz (120.1 kg) 256 lb 9.6 oz (116.4 kg)    Examination:  General:patient sleeping comfortably in bed, does arouse to stimulation, positive acute on chronic respiratory distress(Improvined, new baseline?) Eyes: negative scleral hemorrhage, negative anisocoria, negative icterus ENT: Negative Runny nose, negative gingival bleeding, Neck:  Negative scars, masses, torticollis, lymphadenopathy, JVD Lungs: Clear to auscultation bilaterally without wheezes or crackles Cardiovascular: Regular rate and rhythm without murmur gallop or rub normal S1 and S2 Abdomen: Obese, negative abdominal pain, nondistended, positive soft, bowel sounds, no rebound, no ascites, no appreciable mass Extremities: positive cyanosis, positive bilateral lower extremity edema 2+ to thighs (softening), positive left arm/hand cyanosis, bilateral Unna boots in place  Skin: Negative rashes, lesions, ulcers Psychiatric:  Negative depression, negative anxiety, negative fatigue, negative mania  Central nervous system:  Cranial nerves II through XII intact, tongue/uvula midline, all extremities muscle strength 5/5, sensation intact throughout,  negative dysarthria, negative expressive aphasia, negative receptive aphasia.  .     Data Reviewed:  Care during the described time interval was provided by me .  I have reviewed this patient's available data, including medical history, events of note, physical examination, and all test results as part of my evaluation. I have personally reviewed and interpreted all radiology studies.  CBC:  Recent Labs Lab 03/24/17 0404 03/25/17 0440 03/26/17 0254 03/27/17 0332 03/27/17 0355  WBC 11.8* 12.8* 13.5* 14.0* 13.9*  HGB 11.0* 10.7* 11.4* 11.8* 11.5*  HCT 35.8* 34.7* 36.9* 38.8* 37.6*  MCV 94.7 94.0 93.7 94.2 94.0  PLT 171 160 184 234 017   Basic Metabolic Panel:  Recent Labs Lab 03/22/17 0150  03/24/17 0404 03/25/17 0440 03/26/17 0254 03/27/17 0332 03/27/17 0355  NA 132*  < > 133* 130* 129* 131* 132*  K 3.8  < > 3.6 3.9 4.6 4.2 4.4  CL 94*  < > 92* 90* 88* 85* 86*  CO2 27  < > 31 32 31 33* 33*  GLUCOSE 143*  < > 146* 180* 159* 141* 148*  BUN 78*  < > 64* 53* 48* 41* 41*  CREATININE 1.61*  < > 1.33* 1.28* 1.29* 1.51* 1.47*  CALCIUM 8.2*  < > 8.1* 8.2* 8.9 9.2 9.0  MG 2.6*  --   --   --   --   --   --   PHOS 4.5  --   --   --   --   --   --   < > = values in this interval not displayed. GFR: Estimated Creatinine Clearance: 62.5 mL/min (A) (by C-G formula based on SCr of 1.47 mg/dL (H)). Liver Function Tests:  Recent Labs Lab 03/20/17 1052 03/21/17 0435  AST  --  65*  ALT  --  8*  ALKPHOS  --  82  BILITOT  --  1.7*  PROT 7.1 6.2*  ALBUMIN  --  3.0*   No results for input(s): LIPASE, AMYLASE in the last 168 hours. No results for input(s): AMMONIA in the last 168 hours. Coagulation Profile:  Recent Labs Lab 03/26/17 0254 03/27/17 0332 03/27/17 0355  INR 1.15 1.32 1.34   Cardiac Enzymes: No results for input(s): CKTOTAL, CKMB, CKMBINDEX, TROPONINI in the last 168 hours. BNP (last 3 results) No results for input(s): PROBNP in the last 8760 hours. HbA1C: No results for input(s): HGBA1C in the last 72 hours. CBG:  Recent Labs Lab 03/26/17 0747  03/26/17 1254 03/26/17 1704 03/26/17 2132 03/27/17 0754  GLUCAP 164* 227* 175* 191* 137*   Lipid Profile: No results for input(s): CHOL, HDL, LDLCALC, TRIG, CHOLHDL, LDLDIRECT in the last 72 hours. Thyroid Function Tests: No results for input(s): TSH, T4TOTAL, FREET4, T3FREE, THYROIDAB in the last 72 hours. Anemia Panel: No results for input(s): VITAMINB12, FOLATE, FERRITIN, TIBC, IRON, RETICCTPCT in the last 72 hours. Urine analysis:    Component Value Date/Time   COLORURINE YELLOW 03/19/2017 2359   APPEARANCEUR HAZY (A) 03/19/2017 2359   LABSPEC 1.011 03/19/2017 2359   PHURINE 5.0 03/19/2017 2359   GLUCOSEU NEGATIVE 03/19/2017 2359   HGBUR LARGE (A) 03/19/2017 2359   BILIRUBINUR NEGATIVE 03/19/2017 2359   KETONESUR NEGATIVE 03/19/2017 2359   PROTEINUR NEGATIVE 03/19/2017 2359   NITRITE NEGATIVE 03/19/2017 2359   LEUKOCYTESUR NEGATIVE 03/19/2017 2359   Sepsis Labs: @LABRCNTIP (procalcitonin:4,lacticidven:4)  ) Recent Results (from the past 240 hour(s))  MRSA PCR Screening     Status: None   Collection Time: 03/19/17 10:47 PM  Result Value Ref Range Status   MRSA by PCR NEGATIVE NEGATIVE Final    Comment:        The GeneXpert MRSA Assay (FDA approved for NASAL specimens only), is one component of a comprehensive MRSA colonization surveillance program. It is not intended to diagnose MRSA infection nor to guide or monitor treatment for MRSA infections.   Culture, blood (single)     Status: None   Collection Time: 03/20/17 12:30 AM  Result Value Ref Range Status   Specimen Description BLOOD LEFT ANTECUBITAL  Final   Special Requests   Final    BOTTLES DRAWN AEROBIC AND ANAEROBIC Blood Culture adequate volume   Culture NO GROWTH 5 DAYS  Final   Report Status 03/25/2017 FINAL  Final  Culture, body fluid-bottle     Status: None   Collection Time: 03/20/17  2:46 PM  Result Value Ref Range Status   Specimen Description FLUID RIGHT PLEURAL  Final   Special Requests  NONE  Final   Culture NO GROWTH 5 DAYS  Final   Report Status 03/25/2017 FINAL  Final  Gram stain     Status: None   Collection Time: 03/20/17  2:46 PM  Result Value Ref Range  Status   Specimen Description FLUID RIGHT PLEURAL  Final   Special Requests NONE  Final   Gram Stain   Final    RARE WBC PRESENT, PREDOMINANTLY MONONUCLEAR NO ORGANISMS SEEN    Report Status 03/20/2017 FINAL  Final         Radiology Studies: No results found.      Scheduled Meds: . acetaZOLAMIDE  500 mg Oral BID  . aspirin EC  81 mg Oral QHS  . atorvastatin  40 mg Oral QHS  . clopidogrel  75 mg Oral QAC breakfast  . furosemide  80 mg Intravenous Q8H  . insulin aspart  0-20 Units Subcutaneous TID WC  . insulin aspart  0-5 Units Subcutaneous QHS  . insulin aspart  5 Units Subcutaneous TID WC  . insulin glargine  10 Units Subcutaneous Daily  . sodium chloride flush  10-40 mL Intracatheter Q12H  . spironolactone  25 mg Oral Daily  . Warfarin - Pharmacist Dosing Inpatient   Does not apply q1800   Continuous Infusions: . amiodarone 30 mg/hr (03/26/17 2148)  . heparin 1,800 Units/hr (03/27/17 0534)  . milrinone 0.375 mcg/kg/min (03/27/17 0329)     LOS: 8 days    Time spent: 40 minutes    WOODS, Geraldo Docker, MD [facility] Pager [phone]   If 7PM-7AM, please contact night-coverage [email or web address] Password [email or web address] 03/27/2017, 8:14 AM

## 2017-03-27 NOTE — Progress Notes (Signed)
Advanced Heart Failure Rounding Note   Subjective:    S/p thoracentesis 03/20/17 with 1.4 L out.   Admitted with AF with RVR (unclear duration) complicated by low output HF and massive volume overload. Started on IV amio and milrinone   Now on milrinone 0.375  By telemetry, pt converted to NSR ~ 1900 03/25/17.  Diuresing well. Out 6L. Weight down another 8 pounds. (14 pounds total) however co-ox 47% CVP 4. Creatinine stable at 1.5. Breathing better but sats dropped into low 70s with hall walk. No CP   Echo 03/23/17 LVEF 30-35%, Severe LAE, Moderate RV dilation, Mod RAE  LHC 03/2016 at [place]- Multivessel CAD. Severe stenosis of the LAD Moderate stenosis of the Circumflex; No change from prior study. No restenosis of the RCA . Moderate, Severe global LV systolic dysfunction. LV ejection fraction is 25-30 % Interventional Summary Successful PCI / Xience Drug Eluting Stent of the mid Left Anterior Descending Coronary Artery.   Objective:   Weight Range:  Vital Signs:   Temp:  [97 F (36.1 C)-98.3 F (36.8 C)] 98.3 F (36.8 C) (06/16 0753) Pulse Rate:  [79-94] 81 (06/16 0753) Resp:  [18-24] 20 (06/16 0753) BP: (97-117)/(56-88) 106/71 (06/16 0753) SpO2:  [87 %-96 %] 87 % (06/16 0753) Weight:  [116.4 kg (256 lb 9.6 oz)] 116.4 kg (256 lb 9.6 oz) (06/16 0542) Last BM Date: 03/26/17  Weight change: Filed Weights   03/25/17 0355 03/26/17 0348 03/27/17 0542  Weight: 121.6 kg (268 lb) 120.1 kg (264 lb 12.8 oz) 116.4 kg (256 lb 9.6 oz)    Intake/Output:   Intake/Output Summary (Last 24 hours) at 03/27/17 1156 Last data filed at 03/27/17 1000  Gross per 24 hour  Intake          2161.28 ml  Output             8675 ml  Net         -6513.72 ml     Physical Exam: CVP 4 General: Sitting in chair NAD HEENT: Normal Neck: supple. JVP flat Carotids 2+ bilat; no bruits. No thyromegaly or nodule noted. Cor: PMI nondisplaced. Regular. No M/G/R noted Lungs: Diminished  throoughout Abdomen:  NT/ND soft , no HSM. No bruits or masses. +BS  Extremities: no cyanosis or clubbing. 1+  BLE edema with UNNA boots. LUE PICC site stable.  Neuro: Neuro: alert & oriented x 3, cranial nerves grossly intact. moves all 4 extremities w/o difficulty. Affect pleasant .   Telemetry: NSR 80  Labs: Basic Metabolic Panel:  Recent Labs Lab 03/22/17 0150  03/24/17 0404 03/25/17 0440 03/26/17 0254 03/27/17 0332 03/27/17 0355  NA 132*  < > 133* 130* 129* 131* 132*  K 3.8  < > 3.6 3.9 4.6 4.2 4.4  CL 94*  < > 92* 90* 88* 85* 86*  CO2 27  < > 31 32 31 33* 33*  GLUCOSE 143*  < > 146* 180* 159* 141* 148*  BUN 78*  < > 64* 53* 48* 41* 41*  CREATININE 1.61*  < > 1.33* 1.28* 1.29* 1.51* 1.47*  CALCIUM 8.2*  < > 8.1* 8.2* 8.9 9.2 9.0  MG 2.6*  --   --   --   --   --   --   PHOS 4.5  --   --   --   --   --   --   < > = values in this interval not displayed.  Liver Function Tests:  Recent Labs  Lab 03/21/17 0435  AST 65*  ALT 8*  ALKPHOS 82  BILITOT 1.7*  PROT 6.2*  ALBUMIN 3.0*   No results for input(s): LIPASE, AMYLASE in the last 168 hours. No results for input(s): AMMONIA in the last 168 hours.  CBC:  Recent Labs Lab 03/24/17 0404 03/25/17 0440 03/26/17 0254 03/27/17 0332 03/27/17 0355  WBC 11.8* 12.8* 13.5* 14.0* 13.9*  HGB 11.0* 10.7* 11.4* 11.8* 11.5*  HCT 35.8* 34.7* 36.9* 38.8* 37.6*  MCV 94.7 94.0 93.7 94.2 94.0  PLT 171 160 184 234 213    Cardiac Enzymes: No results for input(s): CKTOTAL, CKMB, CKMBINDEX, TROPONINI in the last 168 hours.  BNP: BNP (last 3 results)  Recent Labs  12/26/16 0716 12/29/16 0355  BNP 412.4* 236.2*    ProBNP (last 3 results) No results for input(s): PROBNP in the last 8760 hours.    Other results:  Imaging: No results found.   Medications:     Scheduled Medications: . acetaZOLAMIDE  500 mg Oral BID  . aspirin EC  81 mg Oral QHS  . atorvastatin  40 mg Oral QHS  . clopidogrel  75 mg Oral QAC  breakfast  . furosemide  80 mg Intravenous Q8H  . insulin aspart  0-20 Units Subcutaneous TID WC  . insulin aspart  0-5 Units Subcutaneous QHS  . insulin aspart  5 Units Subcutaneous TID WC  . insulin glargine  10 Units Subcutaneous Daily  . sodium chloride flush  10-40 mL Intracatheter Q12H  . spironolactone  25 mg Oral Daily  . warfarin  7.5 mg Oral ONCE-1800  . Warfarin - Pharmacist Dosing Inpatient   Does not apply q1800    Infusions: . amiodarone 30 mg/hr (03/27/17 0834)  . heparin 1,800 Units/hr (03/27/17 0534)  . milrinone 0.375 mcg/kg/min (03/27/17 0903)    PRN Medications: acetaminophen **OR** acetaminophen, ondansetron **OR** ondansetron (ZOFRAN) IV, sodium chloride flush   Assessment/Plan/Discussion:   Christian Garner is a71 year old admitted from [facility] with new onset A fib and A/C systolic heart failure.   1. A Fib RVR- This patients CHA2DS2-VASc Score 4 and unadjusted Ischemic Stroke Rate 4%.   New onset.  - Remain in NSR. Will continue IV amio at least one more day.  - Remains on heparin. Continue to load coumadin. INR 1.34 this am. (DOACs too expensive at $280). Discussed with PhrrmD - CHA2DS2/VAS Stroke Risk Points  Is at least 4 (CHF, Vascular disease, Age 20, and DM) 2. A/C Systolic Heart Failure- ECHO 12/2016 EF 30-35%. RV mildly dilated. ICM.  - Most recent [place] 2017 with DES to LAD. Had DES to RCA in 2014  - Volume status about as good as we can get it. Will stop diuretics today. Start po diuretics tomorrow - Co-ox back down despite milrinone 0.375. Will try to wean as tolerated. Not good candidate for home inotropes or advanced therapies with limited insight.  - No BB with low output.   3. A/C Respiratory Failure - Continues to desat with ambulation despite diuresis. Will almost certainly need home O2 - Repeat CXR to reassess for effusion 4. AKI on CKD   -Creatinine March 2018 was 1.26. Creatinine peaked 2.06.  - Creatinine increases slightly again  back up to 1.5, Stopping diuretics  5. Hyperkalemia - Resolved. - Now improved and on spiro. Continue to follow closely.  6. R pleural Effusion - S/P Thoracentesis. - Repeat CXR in am  7. CAD- Multivessel CAD  - s/p DES LAD 6/17, mod stenosis  circumflex. - no s/s of ischemia -  Off bb for now.  - With loading heparin will stop Plavix. Stop ASA when INR > 2.0. On statin. No change.  7. LE cellulitis  - Resolved with ceftriaxone.  8. Deconditioning - PT and CR consulted.    Length of Stay: 8  Christian Bickers, MD  03/27/2017, 11:56 AM  Advanced Heart Failure Team Pager [phone] (M-F; 7a - 4p)  Please contact [facility] for night-coverage after hours (4p -7a ) and weekends on [email or web address]

## 2017-03-27 NOTE — Progress Notes (Signed)
ANTICOAGULATION CONSULT NOTE - Follow Up Consult  Pharmacy Consult for Heparin / Warfarin Indication: atrial fibrillation  No Known Allergies  Patient Measurements: Height: 6\' 2"  (188 cm) Weight: 264 lb 12.8 oz (120.1 kg) IBW/kg (Calculated) : 82.2 Heparin Dosing Weight: 108kg  Vital Signs: Temp: 98.1 F (36.7 C) (06/16 0318) Temp Source: Oral (06/16 0318) BP: 113/64 (06/16 0400) Pulse Rate: 79 (06/16 0400)  Labs:  Recent Labs  03/25/17 0440  03/26/17 0254 03/26/17 1214 03/27/17 0332 03/27/17 0355  HGB 10.7*  --  11.4*  --  11.8* 11.5*  HCT 34.7*  --  36.9*  --  38.8* 37.6*  PLT 160  --  184  --  234 213  LABPROT  --   --  14.7  --  16.5* 16.7*  INR  --   --  1.15  --  1.32 1.34  HEPARINUNFRC >2.20*  < > 0.23* 0.36 0.27*  --   CREATININE 1.28*  --  1.29*  --   --  1.47*  < > = values in this interval not displayed.  Estimated Creatinine Clearance: 63.5 mL/min (A) (by C-G formula based on SCr of 1.47 mg/dL (H)).  Assessment: 70yom continues on heparin for new afib. Heparin level down to slightly subtherapeutic (0.27) on gtt at 1600 units/hr. CBC stable. No issues with line or bleeding reported per RN.  Goal of Therapy:  Heparin level 0.3-0.7 units/ml Monitor platelets by anticoagulation protocol: Yes   Plan:  Increase heparin to 1800 units/hr Will f/u heparin level in 8 hours  Thank you, Sherlon Handing, PharmD, BCPS Clinical pharmacist, pager [phone] 03/27/2017 5:32 AM

## 2017-03-28 DIAGNOSIS — Z9889 Other specified postprocedural states: Secondary | ICD-10-CM

## 2017-03-28 LAB — COOXEMETRY PANEL
CARBOXYHEMOGLOBIN: 1.7 % — AB (ref 0.5–1.5)
Carboxyhemoglobin: 1.8 % — ABNORMAL HIGH (ref 0.5–1.5)
METHEMOGLOBIN: 1.1 % (ref 0.0–1.5)
METHEMOGLOBIN: 1 % (ref 0.0–1.5)
O2 SAT: 65.6 %
O2 Saturation: 72.7 %
TOTAL HEMOGLOBIN: 10.9 g/dL — AB (ref 12.0–16.0)
TOTAL HEMOGLOBIN: 10.6 g/dL — AB (ref 12.0–16.0)

## 2017-03-28 LAB — BASIC METABOLIC PANEL
ANION GAP: 11 (ref 5–15)
BUN: 35 mg/dL — ABNORMAL HIGH (ref 6–20)
CALCIUM: 8.4 mg/dL — AB (ref 8.9–10.3)
CO2: 30 mmol/L (ref 22–32)
Chloride: 87 mmol/L — ABNORMAL LOW (ref 101–111)
Creatinine, Ser: 1.38 mg/dL — ABNORMAL HIGH (ref 0.61–1.24)
GFR calc Af Amer: 58 mL/min — ABNORMAL LOW (ref >60)
GFR calc non Af Amer: 50 mL/min — ABNORMAL LOW (ref >60)
GLUCOSE: 202 mg/dL — AB (ref 65–99)
Potassium: 3.9 mmol/L (ref 3.5–5.1)
Sodium: 128 mmol/L — ABNORMAL LOW (ref 135–145)

## 2017-03-28 LAB — GLUCOSE, CAPILLARY
Glucose-Capillary: 156 mg/dL — ABNORMAL HIGH (ref 65–99)
Glucose-Capillary: 196 mg/dL — ABNORMAL HIGH (ref 65–99)
Glucose-Capillary: 162 mg/dL — ABNORMAL HIGH (ref 65–99)
Glucose-Capillary: 162 mg/dL — ABNORMAL HIGH (ref 65–99)

## 2017-03-28 LAB — CBC
HCT: 33.9 % — ABNORMAL LOW (ref 39.0–52.0)
Hemoglobin: 10.4 g/dL — ABNORMAL LOW (ref 13.0–17.0)
MCH: 28.6 pg (ref 26.0–34.0)
MCHC: 30.7 g/dL (ref 30.0–36.0)
MCV: 93.1 fL (ref 78.0–100.0)
PLATELETS: 179 10*3/uL (ref 150–400)
RBC: 3.64 MIL/uL — AB (ref 4.22–5.81)
RDW: 15.8 % — AB (ref 11.5–15.5)
WBC: 11.5 10*3/uL — AB (ref 4.0–10.5)

## 2017-03-28 LAB — PROTIME-INR
INR: 1.99
Prothrombin Time: 22.9 seconds — ABNORMAL HIGH (ref 11.4–15.2)

## 2017-03-28 LAB — HEPARIN LEVEL (UNFRACTIONATED)
HEPARIN UNFRACTIONATED: 0.48 [IU]/mL (ref 0.30–0.70)
Heparin Unfractionated: >2.2 IU/mL — ABNORMAL HIGH (ref 0.30–0.70)

## 2017-03-28 NOTE — Progress Notes (Signed)
Advanced Heart Failure Rounding Note   Subjective:    S/p thoracentesis 03/20/17 with 1.4 L out.   Admitted with AF with RVR (unclear duration) complicated by low output HF and massive volume overload. Started on IV amio and milrinone   Converted to NSR  03/25/17.  Remains on milrinone 0.375.  IV lasix stopped yesterday with low CVP (4). Weight down another 2 pounds. (16 pounds total). Co-ox 47%->66%  CVP 5-6. Creatinine improved to 1.38.   Breathing better. No orthopnea or PND. No CP.  Still desats at times.   Echo 03/23/17 LVEF 30-35%, Severe LAE, Moderate RV dilation, Mod RAE  LHC 03/2016 at [facility]- Multivessel CAD. Severe stenosis of the LAD Moderate stenosis of the Circumflex; No change from prior study. No restenosis of the RCA . Moderate, Severe global LV systolic dysfunction. LV ejection fraction is 25-30 % Interventional Summary Successful PCI / Xience Drug Eluting Stent of the mid Left Anterior Descending Coronary Artery.   Objective:   Weight Range:  Vital Signs:   Temp:  [97.7 F (36.5 C)-98.3 F (36.8 C)] 97.7 F (36.5 C) (06/17 1152) Pulse Rate:  [77-87] 80 (06/17 1152) Resp:  [13-25] 21 (06/17 1152) BP: (101-136)/(55-70) 101/55 (06/17 1152) SpO2:  [94 %-100 %] 100 % (06/17 1152) Weight:  [115.5 kg (254 lb 9.6 oz)] 115.5 kg (254 lb 9.6 oz) (06/17 0514) Last BM Date: 03/24/17  Weight change: Filed Weights   03/26/17 0348 03/27/17 0542 03/28/17 0514  Weight: 120.1 kg (264 lb 12.8 oz) 116.4 kg (256 lb 9.6 oz) 115.5 kg (254 lb 9.6 oz)    Intake/Output:   Intake/Output Summary (Last 24 hours) at 03/28/17 1500 Last data filed at 03/28/17 1300  Gross per 24 hour  Intake           2069.8 ml  Output             3650 ml  Net          -1580.2 ml     Physical Exam: CVP 5-6 General: Sitting in chair NAD HEENT: Normal anicteric Neck: supple. JVP 5-6 Carotids 2+ bilat; no bruits. No thyromegaly or nodule noted. Cor: PMI nondisplaced.  Regular.No m/r/g Lungs: Diminished throoughout no wheeze  Abdomen:  Soft NT/ND good BS Extremities: no cyanosis or clubbing. Mild BLE edema + UNNA boots UNNA. LUE PICC site stable.  Neuro: alert & oriented x 3, cranial nerves grossly intact. moves all 4 extremities w/o difficulty. Affect pleasant  .   Telemetry: NSR 70-80s Personally reviewed   Labs: Basic Metabolic Panel:  Recent Labs Lab 03/22/17 0150  03/25/17 0440 03/26/17 0254 03/27/17 0332 03/27/17 0355 03/28/17 0300  NA 132*  < > 130* 129* 131* 132* 128*  K 3.8  < > 3.9 4.6 4.2 4.4 3.9  CL 94*  < > 90* 88* 85* 86* 87*  CO2 27  < > 32 31 33* 33* 30  GLUCOSE 143*  < > 180* 159* 141* 148* 202*  BUN 78*  < > 53* 48* 41* 41* 35*  CREATININE 1.61*  < > 1.28* 1.29* 1.51* 1.47* 1.38*  CALCIUM 8.2*  < > 8.2* 8.9 9.2 9.0 8.4*  MG 2.6*  --   --   --   --   --   --   PHOS 4.5  --   --   --   --   --   --   < > = values in this interval not displayed.  Liver Function  Tests: No results for input(s): AST, ALT, ALKPHOS, BILITOT, PROT, ALBUMIN in the last 168 hours. No results for input(s): LIPASE, AMYLASE in the last 168 hours. No results for input(s): AMMONIA in the last 168 hours.  CBC:  Recent Labs Lab 03/25/17 0440 03/26/17 0254 03/27/17 0332 03/27/17 0355 03/28/17 0300  WBC 12.8* 13.5* 14.0* 13.9* 11.5*  HGB 10.7* 11.4* 11.8* 11.5* 10.4*  HCT 34.7* 36.9* 38.8* 37.6* 33.9*  MCV 94.0 93.7 94.2 94.0 93.1  PLT 160 184 234 213 179    Cardiac Enzymes: No results for input(s): CKTOTAL, CKMB, CKMBINDEX, TROPONINI in the last 168 hours.  BNP: BNP (last 3 results)  Recent Labs  12/26/16 0716 12/29/16 0355  BNP 412.4* 236.2*    ProBNP (last 3 results) No results for input(s): PROBNP in the last 8760 hours.    Other results:  Imaging: Dg Chest 2 View  Result Date: 03/27/2017 CLINICAL DATA:  Pleural effusion. EXAM: CHEST  2 VIEW COMPARISON:  03/25/2017 FINDINGS: Heart is enlarged. There is a right pleural  effusion, associated with basilar opacity. Diffuse bilateral interstitial edema. IMPRESSION: 1. Cardiomegaly and interstitial edema. 2. Persistent right pleural effusion and right basilar atelectasis or infiltrate. Electronically Signed   By: Nolon Nations M.D.   On: 03/27/2017 17:00     Medications:     Scheduled Medications: . aspirin EC  81 mg Oral QHS  . atorvastatin  40 mg Oral QHS  . insulin aspart  0-20 Units Subcutaneous TID WC  . insulin aspart  0-5 Units Subcutaneous QHS  . insulin aspart  5 Units Subcutaneous TID WC  . insulin glargine  10 Units Subcutaneous Daily  . sodium chloride flush  10-40 mL Intracatheter Q12H  . spironolactone  25 mg Oral Daily  . Warfarin - Pharmacist Dosing Inpatient   Does not apply q1800    Infusions: . amiodarone 30 mg/hr (03/28/17 0456)  . heparin 1,800 Units/hr (03/28/17 0239)  . milrinone 0.375 mcg/kg/min (03/28/17 1145)    PRN Medications: acetaminophen **OR** acetaminophen, ondansetron **OR** ondansetron (ZOFRAN) IV, sodium chloride flush   Assessment/Plan/Discussion:   Christian Garner is a71 year old admitted from [name] with new onset A fib and A/C systolic heart failure.   1. A Fib RVR- This patients CHA2DS2-VASc Score 4 and unadjusted Ischemic Stroke Rate 4%.   New onset.  - Remain in NSR. Will continue IV amio for now. Possible switch to po in am - Remains on heparin. Loading coumadin. INR 1.99. Stop heparin. (DOACs too expensive at $280). Discussed with PhrrmD - CHA2DS2/VAS Stroke Risk Points  Is at least 4 (CHF, Vascular disease, Age 19, and DM) 2. A/C Systolic Heart Failure- ECHO 12/2016 EF 30-35%. RV mildly dilated. ICM.  - Most recent [place] 2017 with DES to LAD. Had DES to RCA in 2014  - Volume status about as good as we can get it. Continue to hold diuretics. Will restart in am  - Remains on milrinone 0.375. Co-ox back up today. Will wean milrinone to 0.25  - Not good candidate for home inotropes or advanced  therapies with limited insight. -> D/w Dr. Sherral Hammers today at bedside - No BB with low output.   3. A/C Respiratory Failure - Continues to desat with ambulation despite diuresis. Will almost certainly need home O2 - 2v CXR from yesterday viewed personally and has persistent R effusion but small. No need for repeat tap 4. AKI on CKD   -Creatinine March 2018 was 1.26. Creatinine peaked 2.06.  -  Creatinine back to 1.3 today. Hold diuretics one more day.  5. Hyperkalemia - Resolved. - Now improved and on spiro. Continue to follow closely.  6. R pleural Effusion - S/P Thoracentesis. - 2v CXR from yesterday viewed personally and has persistent R effusion but small. No need for repeat tap 7. CAD- Multivessel CAD  - s/p DES LAD 6/17, mod stenosis circumflex. - no s/s of ischemia -  Off bb for now.  - Plavix stopped yesterday. Will stop ASA as well with INR > 2.0. On statin. No change.  7. LE cellulitis  - Resolved with ceftriaxone.  8. Deconditioning - PT and CR consulted.    Length of Stay: 9  Christian Bickers, MD  03/28/2017, 3:00 PM  Advanced Heart Failure Team Pager [phone] (M-F; [place])  Please contact [facility] for night-coverage after hours (4p -7a ) and weekends on [email or web address]

## 2017-03-28 NOTE — Progress Notes (Signed)
ANTICOAGULATION CONSULT NOTE - Follow Up Consult  Pharmacy Consult for Heparin/Coumadin Indication: atrial fibrillation  No Known Allergies  Patient Measurements: Height: 6\' 2"  (188 cm) Weight: 254 lb 9.6 oz (115.5 kg) IBW/kg (Calculated) : 82.2 Heparin Dosing Weight:   Vital Signs: Temp: 98.3 F (36.8 C) (06/17 0750) Temp Source: Oral (06/17 0750) BP: 136/70 (06/17 0750) Pulse Rate: 86 (06/17 0750)  Labs:  Recent Labs  03/27/17 0332 03/27/17 0355 03/27/17 1510 03/28/17 0300 03/28/17 0628  HGB 11.8* 11.5*  --  10.4*  --   HCT 38.8* 37.6*  --  33.9*  --   PLT 234 213  --  179  --   LABPROT 16.5* 16.7*  --  22.9*  --   INR 1.32 1.34  --  1.99  --   HEPARINUNFRC 0.27*  --  0.52 >2.20* 0.48  CREATININE 1.51* 1.47*  --  1.38*  --     Estimated Creatinine Clearance: 66.3 mL/min (A) (by C-G formula based on SCr of 1.38 mg/dL (H)).   Assessment:  Anticoag: on IV heparin for new afib. DOAC copay $280, started warfarin 6/14. INR 1.15>1.34>1.99.Watch INR on amiodarone. Hgb 11.5>10.4 overnight.  - Thoracentesis 6/9 - 6/17: supratherapeutic HL >2.2 this early AM > had stat redraw at 0500 now within range at 0.48.  Goal of Therapy:  Heparin level 0.3-0.7 units/ml Monitor platelets by anticoagulation protocol: Yes   Plan:  - Continue Heparin at 1800 units/hr - Daily heparin level and CBC, INR - Hold Coumadin tonight   Glori Machnik S. Alford Highland, PharmD, BCPS Clinical Staff Pharmacist Pager [phone]  Eilene Ghazi Stillinger 03/28/2017,10:28 AM

## 2017-03-28 NOTE — Progress Notes (Signed)
PROGRESS NOTE    Christian Garner  RXV:[id number] DOB: 03-Jul-1946 DOA: 03/19/2017 PCP: [name], MD   Brief Narrative:  71 y.o.WM PMHx Chronic Systolic and Diastolic CHF (EF 50%), home O2, CAD s/p PCI, CKD baseline Cr 1.3, and DMwho presented with 3-4 days of progressive dyspnea accompanied by leg swelling.  In the ED at [facility] an EKG noted atrial fibrillation w/ a HR of 115.  CXR noted a large R pleural effusion.  He was transferred to [facility].  Of note he had an admission to [name] in March 2018 during which he required intubation as well as CRRT for acute renal failure w/ hyperkalemia and a large transudative pleural effusion.   Subjective: 6/17  A/O 4, sitting comfortably in chair, positive acute on chronic respiratory distress.Negative CP. Negative N/V. Very poor understanding of his current health problems. Please will be able to go home not use O2, not have to have close follow-up with CHF clinic, not closely monitor weight, not participate in cardiac rehabilitation.    Assessment & Plan:   Principal Problem:   Acute on chronic respiratory failure with hypoxia and hypercapnia (HCC) Active Problems:   AKI (acute kidney injury) ([place])   Pleural effusion   Acute on chronic combined systolic and diastolic CHF (congestive heart failure) (HCC)   AF (paroxysmal atrial fibrillation) ([place])   Community acquired pneumonia   Pressure injury of skin   Hyponatremia   Acute on chronic hypoxic and hypercapnic respiratory failure -Appears to be primarily related to volume overload in the setting of a CHF exacerbation + pleural effusion  -Resolving. Now on 3 L O2 (most likely baseline)  -Obtain ambulatory SPO2 on 6/18: Counseled patient and family will need to use home O2 religiously  Newly appreciated atrial fibrillation with RVR(CHA2DS2-VASc Score 4) - Continue Amio gtt per cardiology  - Patient now in NSR appears will not require TEE/DC-CV  -Coumadin per pharmacy Recent  Labs Lab 03/26/17 0254 03/27/17 0332 03/27/17 0355 03/28/17 0300  INR 1.15 1.32 1.34 1.99    Acute on chronic systolic and diastolic congestive heart failure -Echocardiogram: Ejection fraction 30% -35% see results below  -Discussed case with Dr. Haroldine Laws CHF team, and patient has completed diuresis. Will begin to titrate milrinone off starting today. Patient poor candidate for home milrinone per CHF team. Also Discussed with patient and family.  - Continue spiro 25 mg daily.  - No BB with low output.   -LCSW and Palliative care consulted to discuss short-term/long-term care options when patient discharged.  CAD with multiple stents -DES 03/2016  Recurrent Right-sided pleural effusion -Most likely related to CHF  - 6/9 ultrasound-guided thoracentesis yielding ~1.4L -Repeat CXR on 6/16 shows small residual effusion. Repeat thoracentesis not required.  Possible Right lower lobe pneumonia Procalcitonin not consistent with acute bacterial infection - discontinued antibiotics 6/9   Acute renal failure on CKD stage II (Creatinine 1.29 at discharge 01/05/17) Lab Results  Component Value Date   CREATININE 1.38 (H) 03/28/2017   CREATININE 1.47 (H) 03/27/2017   CREATININE 1.51 (H) 03/27/2017  -Continues to improve with diuresis   Hyperkalemia/Hypokalemia -Potassium goal > 4   -Stable continue to monitor closely   Acute urinary retention -Foley in place - likely related to BPH - follow  DM Type 2 Controlled with renal complication -9/32 Hemoglobin A1c= 6.7 -Lipid panel; within ADA guidelines -Lantus 10 units daily -NovoLog 5 units QAC -Resistant SSI      DVT prophylaxis: Coumadin  Code Status: Full Family  Communication: None Disposition Plan: Per cardiology   Consultants:  Cardiology CHF team  Procedures/Significant Events:  6/12 Echocardiogram:Left ventricle: Inferior and posterior lateral hypokinesis Severe LVH.  -LVEF =30% to 35%. - Mitral valve:  mild to  moderate regurgitation. - Left atrium: severely dilated.-- Right ventricle:  moderately dilated. - Right atrium:  moderately dilated. - RV is dialted with septal flattening consisant wtih cor pulmonale.   VENTILATOR SETTINGS: NA   Cultures   Antimicrobials: Anti-infectives    Start     Stop   03/20/17 2200  levofloxacin (LEVAQUIN) IVPB 500 mg  Status:  Discontinued     03/20/17 1108   03/20/17 0030  levofloxacin (LEVAQUIN) IVPB 750 mg     03/20/17 0215       Devices NA   LINES / TUBES:      Continuous Infusions: . amiodarone 30 mg/hr (03/28/17 0456)  . heparin 1,800 Units/hr (03/28/17 0239)  . milrinone 0.375 mcg/kg/min (03/28/17 0457)     Objective: Vitals:   03/28/17 0400 03/28/17 0500 03/28/17 0514 03/28/17 0750  BP: 122/63   136/70  Pulse: 77 87  86  Resp: (!) 22 (!) 21  16  Temp:    98.3 F (36.8 C)  TempSrc:    Oral  SpO2: 94% 97%  99%  Weight:   254 lb 9.6 oz (115.5 kg)   Height:        Intake/Output Summary (Last 24 hours) at 03/28/17 0849 Last data filed at 03/28/17 0600  Gross per 24 hour  Intake          1586.74 ml  Output             3950 ml  Net         -2363.26 ml   Filed Weights   03/26/17 0348 03/27/17 0542 03/28/17 0514  Weight: 264 lb 12.8 oz (120.1 kg) 256 lb 9.6 oz (116.4 kg) 254 lb 9.6 oz (115.5 kg)    Examination:  General:A/O 4,, positive acute on chronic respiratory distress(Improvined, new baseline) Eyes: negative scleral hemorrhage, negative anisocoria, negative icterus ENT: Negative Runny nose, negative gingival bleeding, Neck:  Negative scars, masses, torticollis, lymphadenopathy, JVD Lungs: Clear to auscultation bilaterally without wheezes or crackles Cardiovascular: Regular rate and rhythm without murmur gallop or rub normal S1 and S2 Abdomen: Obese, negative abdominal pain, nondistended, positive soft, bowel sounds, no rebound, no ascites, no appreciable mass Extremities: positive cyanosis, positive bilateral  lower extremity edema 1+ to thighs (softening), positive left arm/hand cyanosis, bilateral Unna boots in place  Skin: Negative rashes, lesions, ulcers Psychiatric:  Negative depression, negative anxiety, negative fatigue, negative mania  Central nervous system:  Cranial nerves II through XII intact, tongue/uvula midline, all extremities muscle strength 5/5, sensation intact throughout,  negative dysarthria, negative expressive aphasia, negative receptive aphasia.  .     Data Reviewed: Care during the described time interval was provided by me .  I have reviewed this patient's available data, including medical history, events of note, physical examination, and all test results as part of my evaluation. I have personally reviewed and interpreted all radiology studies.  CBC:  Recent Labs Lab 03/25/17 0440 03/26/17 0254 03/27/17 0332 03/27/17 0355 03/28/17 0300  WBC 12.8* 13.5* 14.0* 13.9* 11.5*  HGB 10.7* 11.4* 11.8* 11.5* 10.4*  HCT 34.7* 36.9* 38.8* 37.6* 33.9*  MCV 94.0 93.7 94.2 94.0 93.1  PLT 160 184 234 213 175   Basic Metabolic Panel:  Recent Labs Lab 03/22/17 0150  03/25/17 0440 03/26/17 0254  03/27/17 0332 03/27/17 0355 03/28/17 0300  NA 132*  < > 130* 129* 131* 132* 128*  K 3.8  < > 3.9 4.6 4.2 4.4 3.9  CL 94*  < > 90* 88* 85* 86* 87*  CO2 27  < > 32 31 33* 33* 30  GLUCOSE 143*  < > 180* 159* 141* 148* 202*  BUN 78*  < > 53* 48* 41* 41* 35*  CREATININE 1.61*  < > 1.28* 1.29* 1.51* 1.47* 1.38*  CALCIUM 8.2*  < > 8.2* 8.9 9.2 9.0 8.4*  MG 2.6*  --   --   --   --   --   --   PHOS 4.5  --   --   --   --   --   --   < > = values in this interval not displayed. GFR: Estimated Creatinine Clearance: 66.3 mL/min (A) (by C-G formula based on SCr of 1.38 mg/dL (H)). Liver Function Tests: No results for input(s): AST, ALT, ALKPHOS, BILITOT, PROT, ALBUMIN in the last 168 hours. No results for input(s): LIPASE, AMYLASE in the last 168 hours. No results for input(s):  AMMONIA in the last 168 hours. Coagulation Profile:  Recent Labs Lab 03/26/17 0254 03/27/17 0332 03/27/17 0355 03/28/17 0300  INR 1.15 1.32 1.34 1.99   Cardiac Enzymes: No results for input(s): CKTOTAL, CKMB, CKMBINDEX, TROPONINI in the last 168 hours. BNP (last 3 results) No results for input(s): PROBNP in the last 8760 hours. HbA1C: No results for input(s): HGBA1C in the last 72 hours. CBG:  Recent Labs Lab 03/27/17 0754 03/27/17 1401 03/27/17 1733 03/27/17 2216 03/28/17 0801  GLUCAP 137* 174* 178* 187* 156*   Lipid Profile: No results for input(s): CHOL, HDL, LDLCALC, TRIG, CHOLHDL, LDLDIRECT in the last 72 hours. Thyroid Function Tests: No results for input(s): TSH, T4TOTAL, FREET4, T3FREE, THYROIDAB in the last 72 hours. Anemia Panel: No results for input(s): VITAMINB12, FOLATE, FERRITIN, TIBC, IRON, RETICCTPCT in the last 72 hours. Urine analysis:    Component Value Date/Time   COLORURINE YELLOW 03/19/2017 2359   APPEARANCEUR HAZY (A) 03/19/2017 2359   LABSPEC 1.011 03/19/2017 2359   PHURINE 5.0 03/19/2017 2359   GLUCOSEU NEGATIVE 03/19/2017 2359   HGBUR LARGE (A) 03/19/2017 2359   BILIRUBINUR NEGATIVE 03/19/2017 2359   KETONESUR NEGATIVE 03/19/2017 2359   PROTEINUR NEGATIVE 03/19/2017 2359   NITRITE NEGATIVE 03/19/2017 2359   LEUKOCYTESUR NEGATIVE 03/19/2017 2359   Sepsis Labs: @LABRCNTIP (procalcitonin:4,lacticidven:4)  ) Recent Results (from the past 240 hour(s))  MRSA PCR Screening     Status: None   Collection Time: 03/19/17 10:47 PM  Result Value Ref Range Status   MRSA by PCR NEGATIVE NEGATIVE Final    Comment:        The GeneXpert MRSA Assay (FDA approved for NASAL specimens only), is one component of a comprehensive MRSA colonization surveillance program. It is not intended to diagnose MRSA infection nor to guide or monitor treatment for MRSA infections.   Culture, blood (single)     Status: None   Collection Time: 03/20/17 12:30 AM    Result Value Ref Range Status   Specimen Description BLOOD LEFT ANTECUBITAL  Final   Special Requests   Final    BOTTLES DRAWN AEROBIC AND ANAEROBIC Blood Culture adequate volume   Culture NO GROWTH 5 DAYS  Final   Report Status 03/25/2017 FINAL  Final  Culture, body fluid-bottle     Status: None   Collection Time: 03/20/17  2:46 PM  Result  Value Ref Range Status   Specimen Description FLUID RIGHT PLEURAL  Final   Special Requests NONE  Final   Culture NO GROWTH 5 DAYS  Final   Report Status 03/25/2017 FINAL  Final  Gram stain     Status: None   Collection Time: 03/20/17  2:46 PM  Result Value Ref Range Status   Specimen Description FLUID RIGHT PLEURAL  Final   Special Requests NONE  Final   Gram Stain   Final    RARE WBC PRESENT, PREDOMINANTLY MONONUCLEAR NO ORGANISMS SEEN    Report Status 03/20/2017 FINAL  Final         Radiology Studies: Dg Chest 2 View  Result Date: 03/27/2017 CLINICAL DATA:  Pleural effusion. EXAM: CHEST  2 VIEW COMPARISON:  03/25/2017 FINDINGS: Heart is enlarged. There is a right pleural effusion, associated with basilar opacity. Diffuse bilateral interstitial edema. IMPRESSION: 1. Cardiomegaly and interstitial edema. 2. Persistent right pleural effusion and right basilar atelectasis or infiltrate. Electronically Signed   By: Nolon Nations M.D.   On: 03/27/2017 17:00        Scheduled Meds: . aspirin EC  81 mg Oral QHS  . atorvastatin  40 mg Oral QHS  . insulin aspart  0-20 Units Subcutaneous TID WC  . insulin aspart  0-5 Units Subcutaneous QHS  . insulin aspart  5 Units Subcutaneous TID WC  . insulin glargine  10 Units Subcutaneous Daily  . sodium chloride flush  10-40 mL Intracatheter Q12H  . spironolactone  25 mg Oral Daily  . warfarin  7.5 mg Oral ONCE-1800  . Warfarin - Pharmacist Dosing Inpatient   Does not apply q1800   Continuous Infusions: . amiodarone 30 mg/hr (03/28/17 0456)  . heparin 1,800 Units/hr (03/28/17 0239)  .  milrinone 0.375 mcg/kg/min (03/28/17 0457)     LOS: 9 days    Time spent: 40 minutes    Rayola Everhart, Geraldo Docker, MD [facility] Pager [phone]   If 7PM-7AM, please contact night-coverage [email or web address] Password [facility] 03/28/2017, 8:49 AM

## 2017-03-28 NOTE — Progress Notes (Signed)
No charge note:   Palliative medicine consult received. Called patient's spouse and left message requesting return call to schedule meeting time.   Mariana Kaufman, AGNP-C Palliative Medicine  Please call Palliative Medicine team phone with any questions [phone]. For individual providers please see AMION.

## 2017-03-29 LAB — BASIC METABOLIC PANEL
ANION GAP: 11 (ref 5–15)
BUN: 30 mg/dL — ABNORMAL HIGH (ref 6–20)
CHLORIDE: 89 mmol/L — AB (ref 101–111)
CO2: 28 mmol/L (ref 22–32)
Calcium: 8.7 mg/dL — ABNORMAL LOW (ref 8.9–10.3)
Creatinine, Ser: 1.25 mg/dL — ABNORMAL HIGH (ref 0.61–1.24)
GFR calc Af Amer: >60 mL/min (ref >60)
GFR, EST NON AFRICAN AMERICAN: 56 mL/min — AB (ref >60)
Glucose, Bld: 296 mg/dL — ABNORMAL HIGH (ref 65–99)
POTASSIUM: 4 mmol/L (ref 3.5–5.1)
SODIUM: 128 mmol/L — AB (ref 135–145)

## 2017-03-29 LAB — GLUCOSE, CAPILLARY
GLUCOSE-CAPILLARY: 170 mg/dL — AB (ref 65–99)
GLUCOSE-CAPILLARY: 124 mg/dL — AB (ref 65–99)
GLUCOSE-CAPILLARY: 149 mg/dL — AB (ref 65–99)
Glucose-Capillary: 136 mg/dL — ABNORMAL HIGH (ref 65–99)

## 2017-03-29 LAB — COOXEMETRY PANEL
Carboxyhemoglobin: 1.7 % — ABNORMAL HIGH (ref 0.5–1.5)
METHEMOGLOBIN: 1 % (ref 0.0–1.5)
O2 Saturation: 84 %
TOTAL HEMOGLOBIN: 11.4 g/dL — AB (ref 12.0–16.0)

## 2017-03-29 LAB — CBC
HCT: 35 % — ABNORMAL LOW (ref 39.0–52.0)
Hemoglobin: 10.9 g/dL — ABNORMAL LOW (ref 13.0–17.0)
MCH: 28.8 pg (ref 26.0–34.0)
MCHC: 31.1 g/dL (ref 30.0–36.0)
MCV: 92.6 fL (ref 78.0–100.0)
PLATELETS: 190 10*3/uL (ref 150–400)
RBC: 3.78 MIL/uL — AB (ref 4.22–5.81)
RDW: 15.6 % — ABNORMAL HIGH (ref 11.5–15.5)
WBC: 9.7 10*3/uL (ref 4.0–10.5)

## 2017-03-29 LAB — PROTIME-INR
INR: 1.68
PROTHROMBIN TIME: 20 s — AB (ref 11.4–15.2)

## 2017-03-29 LAB — MAGNESIUM: MAGNESIUM: 2.2 mg/dL (ref 1.7–2.4)

## 2017-03-29 LAB — HEPARIN LEVEL (UNFRACTIONATED): Heparin Unfractionated: 0.37 IU/mL (ref 0.30–0.70)

## 2017-03-29 MED ORDER — AMIODARONE HCL 200 MG PO TABS
400.0000 mg | ORAL_TABLET | Freq: Two times a day (BID) | ORAL | Status: DC
  Administered 2017-03-29 – 2017-04-02 (×9): 400 mg via ORAL
  Filled 2017-03-29 (×9): qty 2

## 2017-03-29 MED ORDER — LOSARTAN POTASSIUM 25 MG PO TABS: 12.5000 mg | ORAL_TABLET | Freq: Every day | ORAL | Status: DC

## 2017-03-29 MED ORDER — LOSARTAN POTASSIUM 25 MG PO TABS
12.5000 mg | ORAL_TABLET | Freq: Every day | ORAL | Status: DC
  Administered 2017-03-29 – 2017-04-01 (×4): 12.5 mg via ORAL
  Filled 2017-03-29 (×4): qty 1

## 2017-03-29 MED ORDER — HEPARIN (PORCINE) IN NACL 100-0.45 UNIT/ML-% IJ SOLN
1800.0000 [IU]/h | INTRAMUSCULAR | Status: DC
  Administered 2017-03-29 – 2017-03-30 (×3): 1800 [IU]/h via INTRAVENOUS
  Filled 2017-03-29 (×3): qty 250

## 2017-03-29 MED ORDER — WARFARIN SODIUM 7.5 MG PO TABS
7.5000 mg | ORAL_TABLET | Freq: Once | ORAL | Status: AC
  Administered 2017-03-29: 7.5 mg via ORAL
  Filled 2017-03-29: qty 1

## 2017-03-29 MED ORDER — FUROSEMIDE 40 MG PO TABS
40.0000 mg | ORAL_TABLET | Freq: Two times a day (BID) | ORAL | Status: DC
  Administered 2017-03-29 – 2017-04-01 (×7): 40 mg via ORAL
  Filled 2017-03-29 (×7): qty 1

## 2017-03-29 NOTE — Progress Notes (Signed)
Advanced Heart Failure Rounding Note   Subjective:    Admitted 03/19/17 with AF with RVR, complicated by low output HF and volume overload. Started on IV Amio + milrinone 6/11.   Had thoracentesis on 03/20/17 with 1.4L out. Converted to NSR 03/25/17. Milrinone weaned to 0.25 mcg yesterday. CVP low yesterday, so IV Lasix stopped. Weight down ~14 pounds total. Creatinine down to 1.25 from 2.07.   Echo 03/23/17 LVEF 30-35%, severe LAE, moderate RV dilation, moderate RAE.   Remains in NSR, feels well today. Denies SOB and orthopnea. Wants to go home.   LHC 03/2016 at [facility]- Multivessel CAD. Severe stenosis of the LAD Moderate stenosis of the Circumflex; No change from prior study. No restenosis of the RCA . Moderate, Severe global LV systolic dysfunction. LV ejection fraction is 25-30 % Interventional Summary Successful PCI / Xience Drug Eluting Stent of the mid Left Anterior Descending Coronary Artery.   Objective:   Weight Range: 267 - 253 pounds.   Vital Signs:   Temp:  [97.6 F (36.4 C)-98.3 F (36.8 C)] 98.1 F (36.7 C) (06/18 0425) Pulse Rate:  [76-86] 76 (06/18 0425) Resp:  [16-26] 26 (06/18 0425) BP: (101-136)/(55-72) 127/66 (06/18 0425) SpO2:  [87 %-100 %] 97 % (06/18 0425) Weight:  [253 lb 4.9 oz (114.9 kg)] 253 lb 4.9 oz (114.9 kg) (06/18 0547) Last BM Date: 03/24/17  Weight change: Filed Weights   03/27/17 0542 03/28/17 0514 03/29/17 0547  Weight: 256 lb 9.6 oz (116.4 kg) 254 lb 9.6 oz (115.5 kg) 253 lb 4.9 oz (114.9 kg)    Intake/Output:   Intake/Output Summary (Last 24 hours) at 03/29/17 0717 Last data filed at 03/29/17 0600  Gross per 24 hour  Intake          1512.08 ml  Output             3350 ml  Net         -1837.92 ml     Physical Exam: CVP 5 General:Fatigued appearing male, NAD. In recliner.  HEENT: normal Neck: supple. No JVP.  Carotids 2+ bilat; no bruits. No thyromegaly or nodule noted. Cor: PMI nondisplaced. RRR, No M/G/R  noted Lungs: Clear in upper lobes, diminished in RLL Abdomen: soft, non-tender, distended, no HSM. No bruits or masses. +BS  Extremities: no cyanosis, clubbing, rash, Unna boots on BLE. LUE PICC without erythema or drainage.  Neuro: alert & orientedx3, cranial nerves grossly intact. moves all 4 extremities w/o difficulty. Affect pleasant   .   Telemetry: NSR rates in the 80's, 4 beat run of NSVT    Labs: Basic Metabolic Panel:  Recent Labs Lab 03/26/17 0254 03/27/17 0332 03/27/17 0355 03/28/17 0300 03/29/17 0247  NA 129* 131* 132* 128* 128*  K 4.6 4.2 4.4 3.9 4.0  CL 88* 85* 86* 87* 89*  CO2 31 33* 33* 30 28  GLUCOSE 159* 141* 148* 202* 296*  BUN 48* 41* 41* 35* 30*  CREATININE 1.29* 1.51* 1.47* 1.38* 1.25*  CALCIUM 8.9 9.2 9.0 8.4* 8.7*  MG  --   --   --   --  2.2      CBC:  Recent Labs Lab 03/26/17 0254 03/27/17 0332 03/27/17 0355 03/28/17 0300 03/29/17 0247  WBC 13.5* 14.0* 13.9* 11.5* 9.7  HGB 11.4* 11.8* 11.5* 10.4* 10.9*  HCT 36.9* 38.8* 37.6* 33.9* 35.0*  MCV 93.7 94.2 94.0 93.1 92.6  PLT 184 234 213 179 190     BNP: BNP (last 3 results)  Recent Labs  12/26/16 0716 12/29/16 0355  BNP 412.4* 236.2*      Imaging: Dg Chest 2 View  Result Date: 03/27/2017 CLINICAL DATA:  Pleural effusion. EXAM: CHEST  2 VIEW COMPARISON:  03/25/2017 FINDINGS: Heart is enlarged. There is a right pleural effusion, associated with basilar opacity. Diffuse bilateral interstitial edema. IMPRESSION: 1. Cardiomegaly and interstitial edema. 2. Persistent right pleural effusion and right basilar atelectasis or infiltrate. Electronically Signed   By: Nolon Nations M.D.   On: 03/27/2017 17:00     Transthoracic Echocardiography 03/23/17 Study Conclusions  - Left ventricle: Inferior and posterior lateral hypokinesis Wall   thickness was increased in a pattern of severe LVH. Systolic   function was moderately to severely reduced. The estimated   ejection fraction  was in the range of 30% to 35%. The study is   not technically sufficient to allow evaluation of LV diastolic   function. - Mitral valve: There was mild to moderate regurgitation. - Left atrium: The atrium was severely dilated. - Right ventricle: The cavity size was moderately dilated. - Right atrium: The atrium was moderately dilated. - Atrial septum: No defect or patent foramen ovale was identified. - Impressions: RV is dialted with septal flattening consisant wtih   cor pulmonale.  Impressions:  - RV is dialted with septal flattening consisant wtih cor   pulmonale.     Medications:     Scheduled Medications: . atorvastatin  40 mg Oral QHS  . insulin aspart  0-20 Units Subcutaneous TID WC  . insulin aspart  0-5 Units Subcutaneous QHS  . insulin aspart  5 Units Subcutaneous TID WC  . insulin glargine  10 Units Subcutaneous Daily  . sodium chloride flush  10-40 mL Intracatheter Q12H  . spironolactone  25 mg Oral Daily  . Warfarin - Pharmacist Dosing Inpatient   Does not apply q1800    Infusions: . amiodarone 30 mg/hr (03/29/17 0415)  . milrinone 0.25 mcg/kg/min (03/29/17 0507)    PRN Medications: acetaminophen **OR** acetaminophen, ondansetron **OR** ondansetron (ZOFRAN) IV, sodium chloride flush   Assessment/Plan/Discussion:   Christian Garner is a 71 year old admitted from [facility] with new onset A fib and A/C systolic heart failure.   1. A Fib RVR-  New onset with unknown duration.  - Converted on IV Amiodarone. On day 7 of IV Amio, switch to po Amio today - 400 mg BID x 7 days, then 200mg  BID.  - This patients CHA2DS2-VASc Score and unadjusted Ischemic Stroke Rate (% per year) is equal to 4.8 % stroke rate/year from a score of 4 Above score calculated as 1 point each if present [CHF, HTN, DM, Vascular=MI/PAD/Aortic Plaque, Age if 65-74, or Male], 2 points each if present [Age > 75, or Stroke/TIA/TE] - On warfarin for anticoagulation. (DOAC price was very  expensive).  2. A/C Systolic Heart Failure- ECHO 12/2016 EF 30-35%. RV mildly dilated. ICM.  - LHC in June 2017 at [facility], had DES placed to LAD. Also with DES to RCA in 2014.  - NYHA III - Volume status stable, will start po Lasix 40mg  BID today. No K supplementation needed, K is 4.0 and on Spiro.  - Co ox 84% this am, wean milrinone to 0.125 mcg. Can likely turn off this afternoon.  - Not good candidate for home inotropes or advanced therapies with limited insight. -> Dr. Haroldine Laws discussed with Dr. Sherral Hammers.  - Continue Christian Garner 25mg .  - Start losartan 12.5mg  hs. Eventually will transition to [facility].  3.  A/C Respiratory Failure - Desaturates with ambulation, will need home O2.  4. AKI on CKD: Creatinine March 2018 was 1.26. Creatinine peaked 2.06.  - Creatinine stable at 1.25 today.  - Will restart po diuretics and watch BMET 5. Hyperkalemia  -  Resolved, K stable. Continue Christian Garner.  6. R pleural Effusion - S/P Thoracentesis. - 2v CXR from yesterday viewed by Dr. Haroldine Laws and has persistent R effusion but small. No need for repeat tap - No change to current plan.  7. CAD- Multivessel CAD  - s/p DES LAD 6/17, mod stenosis circumflex. - Denies chest pain  -  No beta blocker with low output.  - Plavix stopped.  - ASA on hold with supratherapeutic INR, now INR 1.68. Can restart ASA if MD ok's.  7. LE cellulitis  - Resolved with ceftriaxone.  - No change to current plan.  8. Deconditioning - CR and PT  reccommending SNF.  9. Hyponatremia - Na 128. Restrict free water.   Length of Stay: [place], NP  03/29/2017, 7:17 AM  Advanced Heart Failure Team Pager [phone] (M-F; [place])  Please contact [facility] for night-coverage after hours (4p -7a ) and weekends on [email or web address]  Patient seen and examined with Jettie Booze, NP. We discussed all aspects of the encounter. I agree with the assessment and plan as stated above.   Feels better today. Milrinone titrated down to 0.25  yesterday. Co-ox 84% today (? Accuracy). Will titrate down to 0.125 today. Volume status improved. Resume po lasix today.   Renal function and sodium stable.   Glori Bickers, MD  10:24 AM

## 2017-03-29 NOTE — Progress Notes (Signed)
ANTICOAGULATION CONSULT NOTE - Follow Up Consult  Pharmacy Consult for Coumadin / heparin  Indication: atrial fibrillation  No Known Allergies  Patient Measurements: Height: 6\' 2"  (188 cm) Weight: 253 lb 4.9 oz (114.9 kg) IBW/kg (Calculated) : 82.2 Heparin Dosing Weight:   Vital Signs: Temp: 98.5 F (36.9 C) (06/18 2011) Temp Source: Oral (06/18 2011) BP: 121/66 (06/18 2011) Pulse Rate: 76 (06/18 2011)  Labs:  Recent Labs  03/27/17 0355  03/28/17 0300 03/28/17 0628 03/29/17 0247 03/29/17 2119  HGB 11.5*  --  10.4*  --  10.9*  --   HCT 37.6*  --  33.9*  --  35.0*  --   PLT 213  --  179  --  190  --   LABPROT 16.7*  --  22.9*  --  20.0*  --   INR 1.34  --  1.99  --  1.68  --   HEPARINUNFRC  --   < > >2.20* 0.48  --  0.37  CREATININE 1.47*  --  1.38*  --  1.25*  --   < > = values in this interval not displayed.  Estimated Creatinine Clearance: 73.1 mL/min (A) (by C-G formula based on SCr of 1.25 mg/dL (H)).   Assessment:  Anticoag: on IV heparin for new afib. DOAC copay $280 too expensive for patient to afford, started warfarin 6/14. INR 1.15>1.34>1.99>1.68. Watch INR on amiodarone. Hgb stable 10s overnight.  - Thoracentesis 6/9 - 6/18: heparin off 6/17, INR trended down to 1.6 this am. No bleeding issues noted. Education completed. He continues to be in NSR.   Warfarin held overnight 6/17  D/w cardiology, with drop in INR and no dose given last night INR likely to continue to drop. Restarted heparin drip 1800 uts/hr this afternoon HL 0.37 at goal  Goal of Therapy:  INR goal 2-3 HL 0.3-0.7 Monitor platelets by anticoagulation protocol: Yes   Plan:  Continue Heparin drip 1800 uts/hr  Daily Protime, CBC, HL  Bonnita Nasuti Pharm.D. CPP, BCPS Clinical Pharmacist [phone] 03/29/2017 10:01 PM  '

## 2017-03-29 NOTE — Progress Notes (Addendum)
ANTICOAGULATION CONSULT NOTE - Follow Up Consult  Pharmacy Consult for Coumadin Indication: atrial fibrillation  No Known Allergies  Patient Measurements: Height: 6\' 2"  (188 cm) Weight: 253 lb 4.9 oz (114.9 kg) IBW/kg (Calculated) : 82.2 Heparin Dosing Weight:   Vital Signs: Temp: 97.5 F (36.4 C) (06/18 0750) Temp Source: Oral (06/18 0750) BP: 101/60 (06/18 0750) Pulse Rate: 76 (06/18 0750)  Labs:  Recent Labs  03/27/17 0355 03/27/17 1510 03/28/17 0300 03/28/17 0628 03/29/17 0247  HGB 11.5*  --  10.4*  --  10.9*  HCT 37.6*  --  33.9*  --  35.0*  PLT 213  --  179  --  190  LABPROT 16.7*  --  22.9*  --  20.0*  INR 1.34  --  1.99  --  1.68  HEPARINUNFRC  --  0.52 >2.20* 0.48  --   CREATININE 1.47*  --  1.38*  --  1.25*    Estimated Creatinine Clearance: 73.1 mL/min (A) (by C-G formula based on SCr of 1.25 mg/dL (H)).   Assessment:  Anticoag: on IV heparin for new afib. DOAC copay $280 too expensive for patient to afford, started warfarin 6/14. INR 1.15>1.34>1.99>1.68. Watch INR on amiodarone. Hgb stable 10s overnight.  - Thoracentesis 6/9 - 6/18: heparin off 6/17, INR trended down to 1.6 this am. No bleeding issues noted. Education completed. He continues to be in NSR.   Warfarin held overnight 6/17  Goal of Therapy:  INR goal 2-3 Monitor platelets by anticoagulation protocol: Yes   Plan:  - Warfarin 7.5 mg tonight - Daily INR  Erin Hearing PharmD., BCPS Clinical Pharmacist Pager [phone] 03/29/2017 8:06 AM   Addendum: D/w cardiology, with drop in INR and no dose given last night INR likely to continue to drop. Will restart heparin this afternoon. No bolus with elevated INR. Will resume at prior rate and check level tonight then daily.  03/29/2017 11:15 AM

## 2017-03-29 NOTE — Progress Notes (Addendum)
[place] TEAM 1 - Stepdown/ICU TEAM  MIKEL HARDGROVE  OIZ:[id number] DOB: Dec 11, 1945 DOA: 03/19/2017 PCP: [name], MD    Brief Narrative:  71 y.o. male with a history of chronic systolic CHF (EF 33%), home O2, CAD s/p PCI, CKD baseline Cr 1.3, and DM who presented with 3-4 days of progressive dyspnea accompanied by leg swelling.  In the ED at [facility] an EKG noted atrial fibrillation w/ a HR of 115.  CXR noted a large R pleural effusion.  He was transferred to [name].  Of note he had an admission to [facility] in March 2018 during which he required intubation as well as CRRT for acute renal failure w/ hyperkalemia and a large transudative pleural effusion.  Subjective: Pt is sitting up in a chair w/ his legs elevated.  He tells me he feels much better.  He is anxious to be d/c to a rehab facility, hopefully tomorrow.  He denies cp, sob, f/c, n/v, or abdom pain.    Assessment & Plan:  Acute hypoxic and hypercapnic respiratory failure Due to volume overload in the setting of a CHF exacerbation + pleural effusion - weaning O2 as able as diuresis continues   Newly appreciated atrial fibrillation with RVR Has spontaneously converted back to NSR - Cards following - remains on heparin gtt per Cards   Acute exacerbation of chronic systolic and diastolic congestive heart failure Ejection fraction 30% via TTE March 2018 w/ grade 2 DD - aggressive care as per CHF team - milrinone being weaned by Cards - net negative ~16L since admit   [facility] Weights   03/27/17 0542 03/28/17 0514 03/29/17 0547  Weight: 116.4 kg (256 lb 9.6 oz) 115.5 kg (254 lb 9.6 oz) 114.9 kg (253 lb 4.9 oz)    Recurrent Right-sided pleural effusion Most likely related to CHF - ultrasound-guided thoracentesis yielding ~1.4L accomplished on 6/9 - f/u CXR 6/16 noted persistent effusion - not clinically significant at this time   Acute kidney injury on chronic kidney disease Creatinine 1.29 at time of discharge 01/05/17 -  creatinine has returned to his baseline    Recent Labs Lab 03/26/17 0254 03/27/17 0332 03/27/17 0355 03/28/17 0300 03/29/17 0247  CREATININE 1.29* 1.51* 1.47* 1.38* 1.25*    Hyperkalemia Resolved  Acute urinary retention Foley in place - likely related to BPH - attempt to remove foley in am   DM2 CBG well controlled - d/c meal coverage and follow   CAD with multiple stents DES 03/2016  Recent hand injury Reports has been evaluated and fracture ruled out - follow clinically - signif ecchymosis noted but w/ good radial pulse and use of hand - follow with patient now on IV heparin  Hematuria Was evaluated by Urology for same during prior hospital stay - follow post foley d/c    DVT prophylaxis: IV heparin  Code Status: NO CODE Family Communication: no family present at time of exam  Disposition Plan: plan for d/c to SNF for rehab stay, likely on 6/19  Consultants:  CHF Team   Procedures: None  Antimicrobials:  Levaquin 6/8   Objective: Blood pressure 115/63, pulse 80, temperature 97.5 F (36.4 C), temperature source Oral, resp. rate (!) 22, height 6\' 2"  (1.88 m), weight 114.9 kg (253 lb 4.9 oz), SpO2 97 %.  Intake/Output Summary (Last 24 hours) at 03/29/17 1527 Last data filed at 03/29/17 1300  Gross per 24 hour  Intake          1166.67 ml  Output             2450 ml  Net         -1283.33 ml   Filed Weights   03/27/17 0542 03/28/17 0514 03/29/17 0547  Weight: 116.4 kg (256 lb 9.6 oz) 115.5 kg (254 lb 9.6 oz) 114.9 kg (253 lb 4.9 oz)    Examination: General:  No acute respiratory distress Lungs: decreased breath sounds in R base - no wheezing  Cardiovascular: RRR w/o rub or M Abdomen: Obese, soft, bowel sounds positive, no rebound Extremities: 1+ bilateral lower extremity edema   CBC:  Recent Labs Lab 03/26/17 0254 03/27/17 0332 03/27/17 0355 03/28/17 0300 03/29/17 0247  WBC 13.5* 14.0* 13.9* 11.5* 9.7  HGB 11.4* 11.8* 11.5* 10.4* 10.9*  HCT  36.9* 38.8* 37.6* 33.9* 35.0*  MCV 93.7 94.2 94.0 93.1 92.6  PLT 184 234 213 179 503   Basic Metabolic Panel:  Recent Labs Lab 03/26/17 0254 03/27/17 0332 03/27/17 0355 03/28/17 0300 03/29/17 0247  NA 129* 131* 132* 128* 128*  K 4.6 4.2 4.4 3.9 4.0  CL 88* 85* 86* 87* 89*  CO2 31 33* 33* 30 28  GLUCOSE 159* 141* 148* 202* 296*  BUN 48* 41* 41* 35* 30*  CREATININE 1.29* 1.51* 1.47* 1.38* 1.25*  CALCIUM 8.9 9.2 9.0 8.4* 8.7*  MG  --   --   --   --  2.2   GFR: Estimated Creatinine Clearance: 73.1 mL/min (A) (by C-G formula based on SCr of 1.25 mg/dL (H)).  CBG:  Recent Labs Lab 03/28/17 1202 03/28/17 1733 03/28/17 2112 03/29/17 0747 03/29/17 1315  GLUCAP 196* 162* 162* 136* 170*    Recent Results (from the past 240 hour(s))  MRSA PCR Screening     Status: None   Collection Time: 03/19/17 10:47 PM  Result Value Ref Range Status   MRSA by PCR NEGATIVE NEGATIVE Final    Comment:        The GeneXpert MRSA Assay (FDA approved for NASAL specimens only), is one component of a comprehensive MRSA colonization surveillance program. It is not intended to diagnose MRSA infection nor to guide or monitor treatment for MRSA infections.   Culture, blood (single)     Status: None   Collection Time: 03/20/17 12:30 AM  Result Value Ref Range Status   Specimen Description BLOOD LEFT ANTECUBITAL  Final   Special Requests   Final    BOTTLES DRAWN AEROBIC AND ANAEROBIC Blood Culture adequate volume   Culture NO GROWTH 5 DAYS  Final   Report Status 03/25/2017 FINAL  Final  Culture, body fluid-bottle     Status: None   Collection Time: 03/20/17  2:46 PM  Result Value Ref Range Status   Specimen Description FLUID RIGHT PLEURAL  Final   Special Requests NONE  Final   Culture NO GROWTH 5 DAYS  Final   Report Status 03/25/2017 FINAL  Final  Gram stain     Status: None   Collection Time: 03/20/17  2:46 PM  Result Value Ref Range Status   Specimen Description FLUID RIGHT PLEURAL   Final   Special Requests NONE  Final   Gram Stain   Final    RARE WBC PRESENT, PREDOMINANTLY MONONUCLEAR NO ORGANISMS SEEN    Report Status 03/20/2017 FINAL  Final     Scheduled Meds: . amiodarone  400 mg Oral BID  . atorvastatin  40 mg Oral QHS  . furosemide  40 mg Oral BID  . insulin aspart  0-20 Units Subcutaneous TID WC  . insulin aspart  0-5 Units Subcutaneous QHS  . insulin aspart  5 Units Subcutaneous TID WC  . insulin glargine  10 Units Subcutaneous Daily  . losartan  12.5 mg Oral Daily  . sodium chloride flush  10-40 mL Intracatheter Q12H  . spironolactone  25 mg Oral Daily  . warfarin  7.5 mg Oral ONCE-1800  . Warfarin - Pharmacist Dosing Inpatient   Does not apply q1800     LOS: 10 days   [name], MD [facility] Office  [phone] Pager - Text Page per Amion as per below:  On-Call/Text Page:      [email or web address]      password [email or web address]  If 7PM-7AM, please contact night-coverage [email or web address] Password [email or web address] 03/29/2017, 3:27 PM

## 2017-03-29 NOTE — Plan of Care (Signed)
Problem: Health Behavior/Discharge Planning: Goal: Ability to manage health-related needs will improve Outcome: Progressing Repeating importance of daily weights, blood sugar checks and dietary restrictions. Patient able to repeat back but forgetful at times  Problem: Education: Goal: Ability to verbalize understanding of medication therapies will improve Outcome: Progressing Does repeat , forgetful   Comments: Patient wishes to go home with assistance. His goals are to watch dietary restrictions, go out occasionally. If he has to wear oxygen, then he will. Could repeat back dietary restrictions of leafy greens ( vitamin K).

## 2017-03-29 NOTE — Progress Notes (Signed)
Pt's TEE/Cardioversion cancelled for 03/30/17. Pt currently in normal sinus rhythm. Trish made aware. Jobe Igo, RN

## 2017-03-29 NOTE — Care Management Note (Addendum)
Case Management Note  Patient Details  Name: AXELL TRIGUEROS MRN: [id number] Date of Birth: 04-06-1946  Subjective/Objective:  Pt admitted with CHf                 Action/Plan:   Pt initially told CM that he stays alone - however later told CM that he has a wife.  Pt appears confused - CM left voicemail for wife requesting call back.   Pt will benefit from PT/OT eval based on CM assessment - CM will request from attending.  CM spoke directly with bedside nurse and informed of pts mental status during assessment - nurse to reassess as this differs from her am assessement   Expected Discharge Date:  03/27/17               Expected Discharge Plan:     In-House Referral:     Discharge planning Services  CM Consult  Post Acute Care Choice:    Choice offered to:     DME Arranged:    DME Agency:     HH Arranged:    HH Agency:     Status of Service:     If discussed at [employer], dates discussed:    Additional Comments: 03/30/2017 Discussed in LOS 6/19 - pt remains appropriate for continued stay - pt remains on low dose Milrinone low co-ox  03/29/17 Pt is alert however orientation remains in question per CM assessment this am.  Pt is not able to recall conversations during the assessment.  CM spoke with pt regarding recommendation of SNF - pt is open to [place] speaking with him and offering list of CSW.  CSW consulted 03/27/17.  Palliative care also following - CM requested callback to discuss case [name], RN 03/29/2017, 10:31 AM

## 2017-03-29 NOTE — Progress Notes (Signed)
CARDIAC REHAB PHASE I   PRE:  Rate/Rhythm: 76 SR  BP:  Supine:  Sitting: 113/56  Standing:    SaO2: 95% 2L    85%RA  MODE:  Ambulation: 120 ft   POST:  Rate/Rhythm: 84 SR  BP:  Supine:   Sitting: 109/75  Standing:    SaO2: 85%RA  86% 2L to 4L to get sats to 95% 2440-1027 Pt encouraged to walk. Tried RA but pt desat to 85%. Started out on 2L. Pt walked 120 ft on 2L with rolling walker, asst x 2 and gait belt use. Had to increase to 4L to get sats to 95% in room. Back to recliner. Pt stated that he weighs daily and knows to call MD of weight gain of 3LBS. Encouraged pt to agree to SNF as he needs oxygen and assistance for mobility. PT to see later today. Needed much assistance to stand.  Pt may be a little forgetful with information.   Graylon Good, RN BSN  03/29/2017 11:06 AM

## 2017-03-29 NOTE — Progress Notes (Signed)
Physical Therapy Treatment Patient Details Name: Christian Garner MRN: [id number] DOB: 1946-07-21 Today's Date: 03/29/2017    History of Present Illness Patient is a 71 y/o male who presents with dyspnea. CXR- showed what appeared to be a large right effusion with possible consolidation. ECG showed new atrial fibrillation. Recently admitted March 2017 with resp failure requiring intubation and AKI requiring CRRT. PMH includes CHF, EF 30-35%, CAD wth multi stents, DM, LE cellulitis.     PT Comments    Pt with improved activity tolerance this date but con't to drop below 88% on 2LO2 via [place]. Pt able to complete 40 reps of standing LE exercises and amb 120' with RW s/p 5 min standing rest break. Acute PT to con't to follow.   Follow Up Recommendations  SNF;Supervision for mobility/OOB;Supervision/Assistance - 24 hour     Equipment Recommendations  None recommended by PT    Recommendations for Other Services OT consult     Precautions / Restrictions Precautions Precautions: Fall Precaution Comments: watch O2, bump up to 4LO2 via [place] for activity Restrictions Weight Bearing Restrictions: No    Mobility  Bed Mobility               General bed mobility comments: up in chair upon PT arrival  Transfers Overall transfer level: Needs assistance Equipment used: Rolling walker (2 wheeled) Transfers: Sit to/from Stand Sit to Stand: Min guard         General transfer comment: v/c's for hand placement and to push up from walker  Ambulation/Gait Ambulation/Gait assistance: Min assist Ambulation Distance (Feet): 120 Feet Assistive device: Rolling walker (2 wheeled) Gait Pattern/deviations: Step-through pattern Gait velocity: dec Gait velocity interpretation: Below normal speed for age/gender General Gait Details: v/c's to stay in walker and stand up straight, SpO2 at 86% on 2LO2 via [place], Cardiac Rehab instructed to turn O2 up to 4LO2 via [place]. Pt stayed >92%. No standing rest  breaks   Stairs            Wheelchair Mobility    Modified Rankin (Stroke Patients Only)       Balance Overall balance assessment: Needs assistance Sitting-balance support: Feet supported;No upper extremity supported Sitting balance-Leahy Scale: Fair     Standing balance support: During functional activity;Bilateral upper extremity supported Standing balance-Leahy Scale: Poor Standing balance comment: Reliant on BUEs for support in standing.                             Cognition Arousal/Alertness: Awake/alert Behavior During Therapy: WFL for tasks assessed/performed Overall Cognitive Status: No family/caregiver present to determine baseline cognitive functioning                                        Exercises General Exercises - Lower Extremity Hip Flexion/Marching: AROM;Both;20 reps;Standing Mini-Sqauts: AROM;Both;20 reps;Standing    General Comments        Pertinent Vitals/Pain Pain Assessment: No/denies pain    Home Living                      Prior Function            PT Goals (current goals can now be found in the care plan section) Acute Rehab PT Goals Patient Stated Goal: improve my breathing Progress towards PT goals: Progressing toward goals    Frequency  Min 2X/week      PT Plan Current plan remains appropriate    Co-evaluation              AM-PAC PT "6 Clicks" Daily Activity  Outcome Measure  Difficulty turning over in bed (including adjusting bedclothes, sheets and blankets)?: None Difficulty moving from lying on back to sitting on the side of the bed? : None Difficulty sitting down on and standing up from a chair with arms (e.g., wheelchair, bedside commode, etc,.)?: A Little Help needed moving to and from a bed to chair (including a wheelchair)?: A Little Help needed walking in hospital room?: A Little Help needed climbing 3-5 steps with a railing? : A Lot 6 Click Score: 19     End of Session Equipment Utilized During Treatment: Oxygen;Gait belt Activity Tolerance: Patient tolerated treatment well Patient left: in chair;with call bell/phone within reach;with chair alarm set Nurse Communication: Mobility status PT Visit Diagnosis: Unsteadiness on feet (R26.81);Pain;Difficulty in walking, not elsewhere classified (R26.2);Muscle weakness (generalized) (M62.81) Pain - Right/Left: Left Pain - part of body: Hand     Time: 1203-1228 PT Time Calculation (min) (ACUTE ONLY): 25 min  Charges:  $Gait Training: 8-22 mins $Therapeutic Exercise: 8-22 mins                    G Codes:       Kittie Plater, PT, DPT Pager #: [phone] Office #: [phone]    [place] 03/29/2017, 1:14 PM

## 2017-03-29 NOTE — Progress Notes (Addendum)
Inpatient Diabetes Program Recommendations  AACE/ADA: New Consensus Statement on Inpatient Glycemic Control (2015)  Target Ranges:  Prepandial:   less than 140 mg/dL      Peak postprandial:   less than 180 mg/dL (1-2 hours)      Critically ill patients:  140 - 180 mg/dL   Lab Results  Component Value Date   GLUCAP 136 (H) 03/29/2017   HGBA1C 6.7 (H) 03/24/2017    Review of Glycemic Control  Diabetes history: type 2  Outpatient Diabetes medications: Januvia 10 mg/dy Current orders for Inpatient glycemic control: Lantus 10 and resistant correction tidwc, HS correction and 5 units novolog meal coverage.  Inpatient Diabetes Program Recommendations:   Spoke with patient to clarify that pt takes only [place] 10 mg daily and is taking a significant amount of insulin here which is controlling well.  Pt states that once he started taking Januvia his blood sugars have been perfect. His A1C is 6.7% which is good control. I am concerned that this insulin regimen may cause hypoglycemia. Pt states the staff is checking his blood sugars after each meal which he feels is not a true reading. May need to contact MD for medication adjustment. Will follow.  Ad-glucose checked after patient ate lunch and cbg checked and was within normal limits after having been given meal coverage and correction for breakfast. Please consider decreasing to the sensitive or moderate correction scale tidwc and discontinue the meal coverage at this time due to concern for possible hypoglycemia now that glucose is controlled. Lantus at 10 units may be sufficient or not needed either. Spoke with RN who stated she did not give correction at lunch as she too felt she needed MD order for either meal coverage or correction and not both. Will send msg to MD.  Thank you Rosita Kea, RN, MSN, CDE  Diabetes Inpatient Program Office: [phone] Pager: [phone] 8:00 am to 5:00 pm

## 2017-03-29 NOTE — Clinical Social Work Note (Signed)
CSW met with pt in room to address consult for "discuss short-term/long-term care options when patient discharged. Patient with severe CHF, poor prognosis per cardiology notes, wife and daughter request meeting; Nursing home placement." Pt agreeable to SNF placement. Pt prefers [name], but agreeable to fax out to other [facility]. CSW left contact number for family to call CSW. CSW assessment to follow. CSW following for family support and discharge needs.   Oretha Ellis, [place], [place] Work [phone]

## 2017-03-30 ENCOUNTER — Encounter (HOSPITAL_COMMUNITY)
Admission: EM | Disposition: A | Discharge status: Skilled Nursing Facility | Payer: Self-pay | Source: Other Acute Inpatient Hospital | Attending: Internal Medicine

## 2017-03-30 DIAGNOSIS — Z7189 Other specified counseling: Secondary | ICD-10-CM

## 2017-03-30 DIAGNOSIS — Z515 Encounter for palliative care: Secondary | ICD-10-CM

## 2017-03-30 LAB — HEPARIN LEVEL (UNFRACTIONATED): HEPARIN UNFRACTIONATED: 0.42 [IU]/mL (ref 0.30–0.70)

## 2017-03-30 LAB — COOXEMETRY PANEL
CARBOXYHEMOGLOBIN: 1.5 % (ref 0.5–1.5)
CARBOXYHEMOGLOBIN: 1.8 % — AB (ref 0.5–1.5)
METHEMOGLOBIN: 1.1 % (ref 0.0–1.5)
Methemoglobin: 0.6 % (ref 0.0–1.5)
O2 SAT: 46.7 %
O2 SAT: 53.8 %
Total hemoglobin: 11.2 g/dL — ABNORMAL LOW (ref 12.0–16.0)
Total hemoglobin: 16.2 g/dL — ABNORMAL HIGH (ref 12.0–16.0)

## 2017-03-30 LAB — CBC
HEMATOCRIT: 36.6 % — AB (ref 39.0–52.0)
Hemoglobin: 11.2 g/dL — ABNORMAL LOW (ref 13.0–17.0)
MCH: 28.1 pg (ref 26.0–34.0)
MCHC: 30.6 g/dL (ref 30.0–36.0)
MCV: 92 fL (ref 78.0–100.0)
PLATELETS: 218 10*3/uL (ref 150–400)
RBC: 3.98 MIL/uL — ABNORMAL LOW (ref 4.22–5.81)
RDW: 15.8 % — ABNORMAL HIGH (ref 11.5–15.5)
WBC: 10.8 10*3/uL — AB (ref 4.0–10.5)

## 2017-03-30 LAB — BASIC METABOLIC PANEL
Anion gap: 9 (ref 5–15)
Anion gap: 9 (ref 5–15)
BUN: 32 mg/dL — AB (ref 6–20)
BUN: 35 mg/dL — ABNORMAL HIGH (ref 6–20)
CHLORIDE: 91 mmol/L — AB (ref 101–111)
CHLORIDE: 91 mmol/L — AB (ref 101–111)
CO2: 28 mmol/L (ref 22–32)
CO2: 29 mmol/L (ref 22–32)
CREATININE: 1.3 mg/dL — AB (ref 0.61–1.24)
CREATININE: 1.28 mg/dL — AB (ref 0.61–1.24)
Calcium: 8.4 mg/dL — ABNORMAL LOW (ref 8.9–10.3)
Calcium: 8.6 mg/dL — ABNORMAL LOW (ref 8.9–10.3)
GFR calc Af Amer: >60 mL/min (ref >60)
GFR calc Af Amer: >60 mL/min (ref >60)
GFR calc non Af Amer: 54 mL/min — ABNORMAL LOW (ref >60)
GFR calc non Af Amer: 55 mL/min — ABNORMAL LOW (ref >60)
GLUCOSE: 145 mg/dL — AB (ref 65–99)
Glucose, Bld: 252 mg/dL — ABNORMAL HIGH (ref 65–99)
POTASSIUM: 4 mmol/L (ref 3.5–5.1)
Potassium: 4.1 mmol/L (ref 3.5–5.1)
SODIUM: 128 mmol/L — AB (ref 135–145)
Sodium: 129 mmol/L — ABNORMAL LOW (ref 135–145)

## 2017-03-30 LAB — GLUCOSE, CAPILLARY
Glucose-Capillary: 127 mg/dL — ABNORMAL HIGH (ref 65–99)
Glucose-Capillary: 190 mg/dL — ABNORMAL HIGH (ref 65–99)
Glucose-Capillary: 164 mg/dL — ABNORMAL HIGH (ref 65–99)
Glucose-Capillary: 161 mg/dL — ABNORMAL HIGH (ref 65–99)

## 2017-03-30 LAB — PROTIME-INR
INR: 1.68
INR: 2.19
PROTHROMBIN TIME: 20 s — AB (ref 11.4–15.2)
Prothrombin Time: 24.7 seconds — ABNORMAL HIGH (ref 11.4–15.2)

## 2017-03-30 LAB — MAGNESIUM: Magnesium: 2.3 mg/dL (ref 1.7–2.4)

## 2017-03-30 SURGERY — ECHOCARDIOGRAM, TRANSESOPHAGEAL
Anesthesia: Monitor Anesthesia Care

## 2017-03-30 MED ORDER — WARFARIN SODIUM 10 MG PO TABS: 10.0000 mg | ORAL_TABLET | Freq: Once | ORAL | Status: DC

## 2017-03-30 MED ORDER — WARFARIN SODIUM 5 MG PO TABS
5.0000 mg | ORAL_TABLET | Freq: Once | ORAL | Status: AC
  Administered 2017-03-30: 5 mg via ORAL
  Filled 2017-03-30: qty 1

## 2017-03-30 MED ORDER — ASPIRIN EC 81 MG PO TBEC
81.0000 mg | DELAYED_RELEASE_TABLET | Freq: Every day | ORAL | Status: DC
  Administered 2017-03-30 – 2017-04-02 (×4): 81 mg via ORAL
  Filled 2017-03-30 (×4): qty 1

## 2017-03-30 MED ORDER — MILRINONE LACTATE IN DEXTROSE 20-5 MG/100ML-% IV SOLN
0.0625 ug/kg/min | INTRAVENOUS | Status: DC
  Administered 2017-03-31: 0.0625 ug/kg/min via INTRAVENOUS
  Filled 2017-03-30: qty 100

## 2017-03-30 NOTE — Progress Notes (Signed)
PROGRESS NOTE    Christian Garner  MCN:[id number] DOB: [birth year] DOA: 03/19/2017 PCP: [name], MD   Brief Narrative:  71 y.o.WM PMHx Chronic Systolic and Diastolic CHF (EF 62%), home O2, CAD s/p PCI, CKD baseline Cr 1.3, and DMwho presented with 3-4 days of progressive dyspnea accompanied by leg swelling.  In the ED at [name] an EKG noted atrial fibrillation w/ a HR of 115.  CXR noted a large R pleural effusion.  He was transferred to [facility].  Of note he had an admission to [facility] in March 2018 during which he required intubation as well as CRRT for acute renal failure w/ hyperkalemia and a large transudative pleural effusion.   Subjective: 6/19  A/O 4, sitting comfortably in chair, positive acute on chronic respiratory distress.Negative CP. Negative N/V. Patient now back on milrinone     Assessment & Plan:   Principal Problem:   Acute on chronic respiratory failure with hypoxia and hypercapnia (HCC) Active Problems:   AKI (acute kidney injury) (HCC)   Pleural effusion   Acute on chronic combined systolic and diastolic CHF (congestive heart failure) (HCC)   AF (paroxysmal atrial fibrillation) ([place])   Community acquired pneumonia   Pressure injury of skin   Hyponatremia   Acute on chronic hypoxic and hypercapnic respiratory failure -Appears to be primarily related to volume overload in the setting of a CHF exacerbation + pleural effusion  -Resolving. Now on 2 L O2  -SATURATION QUALIFICATIONS: (This note is used to comply with regulatory documentation for home oxygen) Patient Saturations on Room Air at Rest = 86% Patient Saturations on Room Air while Ambulating = 83% Patient Saturations on 4 Liters of oxygen while Ambulating = 90% Please briefly explain why patient needs home oxygen:  -Home oxygen ordered  Newly appreciated atrial fibrillation with RVR(CHA2DS2-VASc Score 4) - Amiodarone 400 mg BID  - Patient now in NSR appears will not require TEE/DC-CV    -Coumadin per pharmacy  Recent Labs Lab 03/27/17 0332 03/27/17 0355 03/28/17 0300 03/29/17 0247 03/30/17 0218 03/30/17 1311  INR 1.32 1.34 1.99 1.68 1.68 2.19   Acute on chronic systolic and diastolic congestive heart failure -Echocardiogram: Ejection fraction 30% -35% see results below  -Discussed case with Dr. Haroldine Laws CHF team, and patient has completed diuresis. Will begin to titrate milrinone off starting today. Patient poor candidate for home milrinone per CHF team. Also Discussed with patient and family.  - Continue spiro 25 mg daily.  - No BB with low output.  -Patient back on Milrinone drip per CHF team   -LCSW and Palliative care consulted to discuss short-term/long-term care options when patient discharged.  CAD with multiple stents -DES 03/2016  Recurrent Right-sided pleural effusion -Most likely related to CHF  - 6/9 ultrasound-guided thoracentesis yielding ~1.4L -Repeat CXR on 6/16 shows small residual effusion. Repeat thoracentesis not required.  Possible Right lower lobe pneumonia/ CAP Procalcitonin not consistent with acute bacterial infection - discontinued antibiotics 6/9   Acute renal failure on CKD stage II (Creatinine 1.29 at discharge 01/05/17) Lab Results  Component Value Date   CREATININE 1.28 (H) 03/30/2017   CREATININE 1.30 (H) 03/30/2017   CREATININE 1.25 (H) 03/29/2017  -Continues to improve with diuresis   Hyperkalemia/Hypokalemia -Potassium goal > 4   -Stable continue to monitor closely   Acute urinary retention -Foley in place - likely related to BPH - follow  DM Type 2 Controlled with renal complication -9/47 Hemoglobin A1c= 6.7 -Lipid panel; within ADA guidelines -Lantus  10 units daily -Resistant SSI      DVT prophylaxis: Coumadin Per pharmacy Code Status: DO NOT RESUSCITATE Family Communication: None Disposition Plan: Per cardiology   Consultants:  Cardiology CHF team   Procedures/Significant Events:  6/12  Echocardiogram:Left ventricle: Inferior and posterior lateral hypokinesis Severe LVH.  -LVEF =30% to 35%. - Mitral valve:  mild to moderate regurgitation. - Left atrium: severely dilated.-- Right ventricle:  moderately dilated. - Right atrium:  moderately dilated. - RV is dialted with septal flattening consisant wtih cor pulmonale.   VENTILATOR SETTINGS: NA   Cultures   Antimicrobials: Anti-infectives    Start     Stop   03/20/17 2200  levofloxacin (LEVAQUIN) IVPB 500 mg  Status:  Discontinued     03/20/17 1108   03/20/17 0030  levofloxacin (LEVAQUIN) IVPB 750 mg     03/20/17 0215       Devices NA   LINES / TUBES:      Continuous Infusions: . milrinone 0.0625 mcg/kg/min (03/30/17 0930)     Objective: Vitals:   03/30/17 1130 03/30/17 1200 03/30/17 1541 03/30/17 1600  BP: (!) 109/57 (!) 98/58 (!) 120/58 103/72  Pulse: 75 75 84 82  Resp: 19 (!) 21 (!) 21 (!) 24  Temp: 97.7 F (36.5 C)  97.4 F (36.3 C)   TempSrc: Oral  Oral   SpO2: 95% 96% 100% 95%  Weight:      Height:        Intake/Output Summary (Last 24 hours) at 03/30/17 1859 Last data filed at 03/30/17 1808  Gross per 24 hour  Intake           713.52 ml  Output             1501 ml  Net          -787.48 ml   Filed Weights   03/28/17 0514 03/29/17 0547 03/30/17 0309  Weight: 254 lb 9.6 oz (115.5 kg) 253 lb 4.9 oz (114.9 kg) 251 lb 12.8 oz (114.2 kg)    Examination:  General:A/O 4,, positive acute on chronic respiratory distress(Improvined, new baseline) Eyes: negative scleral hemorrhage, negative anisocoria, negative icterus ENT: Negative Runny nose, negative gingival bleeding, Neck:  Negative scars, masses, torticollis, lymphadenopathy, JVD Lungs: Clear to auscultation bilaterally without wheezes or crackles Cardiovascular: Regular rate and rhythm without murmur gallop or rub normal S1 and S2 Abdomen: Obese, negative abdominal pain, nondistended, positive soft, bowel sounds, no rebound, no  ascites, no appreciable mass Extremities: positive cyanosis, positive bilateral lower extremity edema 1+ to thighs bilateral Unna boots in place  Skin: Negative rashes, lesions, ulcers Psychiatric:  Negative depression, negative anxiety, negative fatigue, negative mania  Central nervous system:  Cranial nerves II through XII intact, tongue/uvula midline, all extremities muscle strength 5/5, sensation intact throughout,  negative dysarthria, negative expressive aphasia, negative receptive aphasia.  .     Data Reviewed: Care during the described time interval was provided by me .  I have reviewed this patient's available data, including medical history, events of note, physical examination, and all test results as part of my evaluation. I have personally reviewed and interpreted all radiology studies.  CBC:  Recent Labs Lab 03/27/17 0332 03/27/17 0355 03/28/17 0300 03/29/17 0247 03/30/17 0402  WBC 14.0* 13.9* 11.5* 9.7 10.8*  HGB 11.8* 11.5* 10.4* 10.9* 11.2*  HCT 38.8* 37.6* 33.9* 35.0* 36.6*  MCV 94.2 94.0 93.1 92.6 92.0  PLT 234 213 179 190 397   Basic Metabolic Panel:  Recent Labs Lab  03/27/17 0355 03/28/17 0300 03/29/17 0247 03/30/17 0402 03/30/17 1311  NA 132* 128* 128* 128* 129*  K 4.4 3.9 4.0 4.0 4.1  CL 86* 87* 89* 91* 91*  CO2 33* 30 28 28 29   GLUCOSE 148* 202* 296* 252* 145*  BUN 41* 35* 30* 32* 35*  CREATININE 1.47* 1.38* 1.25* 1.30* 1.28*  CALCIUM 9.0 8.4* 8.7* 8.4* 8.6*  MG  --   --  2.2 2.3  --    GFR: Estimated Creatinine Clearance: 71.1 mL/min (A) (by C-G formula based on SCr of 1.28 mg/dL (H)). Liver Function Tests: No results for input(s): AST, ALT, ALKPHOS, BILITOT, PROT, ALBUMIN in the last 168 hours. No results for input(s): LIPASE, AMYLASE in the last 168 hours. No results for input(s): AMMONIA in the last 168 hours. Coagulation Profile:  Recent Labs Lab 03/27/17 0355 03/28/17 0300 03/29/17 0247 03/30/17 0218 03/30/17 1311  INR 1.34  1.99 1.68 1.68 2.19   Cardiac Enzymes: No results for input(s): CKTOTAL, CKMB, CKMBINDEX, TROPONINI in the last 168 hours. BNP (last 3 results) No results for input(s): PROBNP in the last 8760 hours. HbA1C: No results for input(s): HGBA1C in the last 72 hours. CBG:  Recent Labs Lab 03/29/17 1631 03/29/17 2214 03/30/17 0759 03/30/17 1135 03/30/17 1545  GLUCAP 124* 149* 127* 190* 164*   Lipid Profile: No results for input(s): CHOL, HDL, LDLCALC, TRIG, CHOLHDL, LDLDIRECT in the last 72 hours. Thyroid Function Tests: No results for input(s): TSH, T4TOTAL, FREET4, T3FREE, THYROIDAB in the last 72 hours. Anemia Panel: No results for input(s): VITAMINB12, FOLATE, FERRITIN, TIBC, IRON, RETICCTPCT in the last 72 hours. Urine analysis:    Component Value Date/Time   COLORURINE YELLOW 03/19/2017 2359   APPEARANCEUR HAZY (A) 03/19/2017 2359   LABSPEC 1.011 03/19/2017 2359   PHURINE 5.0 03/19/2017 2359   GLUCOSEU NEGATIVE 03/19/2017 2359   HGBUR LARGE (A) 03/19/2017 2359   BILIRUBINUR NEGATIVE 03/19/2017 2359   KETONESUR NEGATIVE 03/19/2017 2359   PROTEINUR NEGATIVE 03/19/2017 2359   NITRITE NEGATIVE 03/19/2017 2359   LEUKOCYTESUR NEGATIVE 03/19/2017 2359   Sepsis Labs: @LABRCNTIP (procalcitonin:4,lacticidven:4)  ) No results found for this or any previous visit (from the past 240 hour(s)).       Radiology Studies: No results found.      Scheduled Meds: . amiodarone  400 mg Oral BID  . aspirin EC  81 mg Oral Daily  . atorvastatin  40 mg Oral QHS  . furosemide  40 mg Oral BID  . insulin aspart  0-20 Units Subcutaneous TID WC  . insulin aspart  0-5 Units Subcutaneous QHS  . insulin glargine  10 Units Subcutaneous Daily  . losartan  12.5 mg Oral Daily  . sodium chloride flush  10-40 mL Intracatheter Q12H  . spironolactone  25 mg Oral Daily  . Warfarin - Pharmacist Dosing Inpatient   Does not apply q1800   Continuous Infusions: . milrinone 0.0625 mcg/kg/min  (03/30/17 0930)     LOS: 11 days    Time spent: 40 minutes    Broghan Pannone, Geraldo Docker, MD [facility] Pager [phone]   If 7PM-7AM, please contact night-coverage [email or web address] Password [facility] 03/30/2017, 6:59 PM

## 2017-03-30 NOTE — Progress Notes (Signed)
CARDIAC REHAB PHASE I   PRE:  Rate/Rhythm: 82 SR  BP:  Supine:   Sitting: 100/51  Standing:    SaO2: 96% 2L  MODE:  Ambulation: 120 ft   POST:  Rate/Rhythm: 87 SR  BP:  Supine:   Sitting: 91/73  Standing:    SaO2: 90% 4L   Was 86% on 3L 1325-1353 Encouraged pt to walk. Pt walked 120 ft on 4L of oxygen (started out on 3L but desat) with gait belt use, rolling walker and asst x2 with fairly steady gait. To recliner after walk. Wife in room when we left.  He wants to go home.   Graylon Good, RN BSN  03/30/2017 1:49 PM

## 2017-03-30 NOTE — Consult Note (Signed)
Consultation Note Date: 03/30/2017   Patient Name: Christian Garner  DOB: 11/04/1945  MRN: [id number]  Age / Sex: 71 y.o., male  PCP: [name], MD Referring Physician: Allie Bossier, MD  Reason for Consultation: Establishing goals of care  HPI/Patient Profile: 71 y.o. male  with past medical history of CHF EF 30%, home oxygen, CAD, CKD baseline creatinine 1.3, diabetes mellitus admitted on 03/19/2017 with dyspnea r/t heart failure.   Clinical Assessment and Goals of Care: I met today with Christian Garner, wife Christian Garner, and daughter Christian Garner. Christian Garner ran the conversation which mostly focused on his frustration of being in the hospital (mainly his loss of freedom and having to rely on other people for everything). I have spoken with RN about some of the concerns to make this hospital stay less stressful on him. I believe that Christian Garner understands the severity of his heart failure but I am unsure if he understands why he must stay in the hospital if his goal is to try and optimize his health. He seeks definitive answers which unfortunately we cannot give (how long he will be hospitalized, what his health/QOL will be like upon d/c, etc). I explained the purpose of milrinone multiple ways to him and his family. I also explained that currently the only way he could leave the hospital is with a focus on comfort care and hospice. Wife gasped at the mention of hospice.   Family goals are to continue to attempt weans at milrinone with goal to proceed to SNF rehab and remain very hopeful for improvement. Unfortunately family did not really have much opportunity to participate in conversation. Not sure how realistic these goals are but will continue to discuss and follow.  **Please provide communication to family as they are not getting any info from patient. Wife/dtr phone numbers updated on facesheet.**  Primary Decision  Maker PATIENT with help of wife and daughter, Christian Garner    SUMMARY OF RECOMMENDATIONS   - Continue to be hopeful for improvement and transition to SNF rehab - Unhappy with hospital stay and nursing staff to try and give Christian Garner as much control as possible - Plans for milrinone per heart failure team (continuing to wean but unsuccessful so far)  Code Status/Advance Care Planning:  DNR - did not discuss, already in place   Symptom Management:   Per heart failure team  Palliative Prophylaxis:   Delirium Protocol, Frequent Pain Assessment and Turn Reposition  Additional Recommendations (Limitations, Scope, Preferences):  Avoid Hospitalization  Psycho-social/Spiritual:   Desire for further Chaplaincy support:no  Additional Recommendations: Caregiving  Support/Resources and Education on Hospice  Prognosis:   Unable to determine  Discharge Planning: To Be Determined      Primary Diagnoses: Present on Admission: . Acute on chronic combined systolic and diastolic CHF (congestive heart failure) ([place]) . Acute on chronic respiratory failure with hypoxia and hypercapnia (HCC) . Pleural effusion . AKI (acute kidney injury) ([place]) . AF (paroxysmal atrial fibrillation) ([place]) . Community acquired pneumonia   I  have reviewed the medical record, interviewed the patient and family, and examined the patient. The following aspects are pertinent.  Past Medical History:  Diagnosis Date  . CAD (coronary artery disease)   . Cellulitis and abscess of lower extremity   . CHF (congestive heart failure) (HCC)    EF 30%  . DM (diabetes mellitus) ([place])    Social History   Social History  . Marital status: Married    Spouse name: N/A  . Number of children: N/A  . Years of education: N/A   Social History Main Topics  . Smoking status: Former Research scientist (life sciences)  . Smokeless tobacco: Never Used  . Alcohol use None  . Drug use: Unknown  . Sexual activity: Not Asked   Other Topics Concern  .  None   Social History Narrative  . None   Family History  Problem Relation Age of Onset  . Hypertension Mother   . Diabetes Mother   . Bone cancer Mother   . Hypertension Father   . Bladder Cancer Sister    Scheduled Meds: . amiodarone  400 mg Oral BID  . aspirin EC  81 mg Oral Daily  . atorvastatin  40 mg Oral QHS  . furosemide  40 mg Oral BID  . insulin aspart  0-20 Units Subcutaneous TID WC  . insulin aspart  0-5 Units Subcutaneous QHS  . insulin glargine  10 Units Subcutaneous Daily  . losartan  12.5 mg Oral Daily  . sodium chloride flush  10-40 mL Intracatheter Q12H  . spironolactone  25 mg Oral Daily  . warfarin  5 mg Oral ONCE-1800  . Warfarin - Pharmacist Dosing Inpatient   Does not apply q1800   Continuous Infusions: . milrinone 0.0625 mcg/kg/min (03/30/17 0930)   PRN Meds:.acetaminophen **OR** acetaminophen, ondansetron **OR** ondansetron (ZOFRAN) IV, sodium chloride flush No Known Allergies Review of Systems  Constitutional: Positive for activity change, appetite change and fatigue.  Respiratory: Negative for shortness of breath.   Cardiovascular: Positive for leg swelling.  Neurological: Positive for weakness.    Physical Exam  Constitutional: He is oriented to person, place, and time. He appears well-developed.  HENT:  Head: Normocephalic and atraumatic.  Cardiovascular: Normal rate and regular rhythm.   Pulmonary/Chest: Effort normal. No accessory muscle usage. No tachypnea. No respiratory distress.  Abdominal: Normal appearance.  Neurological: He is alert and oriented to person, place, and time.  Oriented but has difficulty remembering or grasping health information when explained  Nursing note and vitals reviewed.   Vital Signs: BP (!) 98/58   Pulse 75   Temp 97.7 F (36.5 C) (Oral)   Resp (!) 21   Ht _0  (1.88 m)   Wt 114.2 kg (251 lb 12.8 oz)   SpO2 96%   BMI 32.33 kg/m  Pain Assessment: No/denies pain   Pain Score: 0-No  pain   SpO2: SpO2: 96 % O2 Device:SpO2: 96 % O2 Flow Rate: .O2 Flow Rate (L/min): 2 L/min  IO: Intake/output summary:  Intake/Output Summary (Last 24 hours) at 03/30/17 1532 Last data filed at 03/30/17 1400  Gross per 24 hour  Intake           947.65 ml  Output             1751 ml  Net          -803.35 ml    LBM: Last BM Date: 03/26/17 Baseline Weight: Weight: 121.3 kg (267 lb 6.7 oz) Most recent weight: Weight: 114.2 kg (  251 lb 12.8 oz)     Palliative Assessment/Data: 40%    Flowsheet Rows     Most Recent Value  Intake Tab  Referral Department  Hospitalist  Unit at Time of Referral  Intermediate Care Unit  Palliative Care Primary Diagnosis  Cardiac  Date Notified  03/27/17  Palliative Care Type  New Palliative care  Reason for referral  Clarify Goals of Care  Date of Admission  03/19/17  Date first seen by Palliative Care  03/30/17  # of days Palliative referral response time  3 Day(s)  # of days IP prior to Palliative referral  8  Clinical Assessment  Psychosocial & Spiritual Assessment  Palliative Care Outcomes     Time Total: 8mn  Greater than 50%  of this time was spent counseling and coordinating care related to the above assessment and plan.  Signed by: AVinie Sill NP Palliative Medicine Team Pager # [phone](M-F 8a-5p) Team Phone # [phone](Nights/Weekends)

## 2017-03-30 NOTE — Progress Notes (Signed)
ANTICOAGULATION CONSULT NOTE - Follow Up Consult  Pharmacy Consult for Coumadin / heparin  Indication: atrial fibrillation  No Known Allergies  Patient Measurements: Height: 6\' 2"  (188 cm) Weight: 251 lb 12.8 oz (114.2 kg) IBW/kg (Calculated) : 82.2 Heparin Dosing Weight:   Vital Signs: Temp: 97.8 F (36.6 C) (06/19 0755) Temp Source: Oral (06/19 0755) BP: 142/125 (06/19 0755) Pulse Rate: 68 (06/19 0755)  Labs:  Recent Labs  03/28/17 0300 03/28/17 0628 03/29/17 0247 03/29/17 2119 03/30/17 0218 03/30/17 0402  HGB 10.4*  --  10.9*  --   --  11.2*  HCT 33.9*  --  35.0*  --   --  36.6*  PLT 179  --  190  --   --  218  LABPROT 22.9*  --  20.0*  --  20.0*  --   INR 1.99  --  1.68  --  1.68  --   HEPARINUNFRC >2.20* 0.48  --  0.37 0.42  --   CREATININE 1.38*  --  1.25*  --   --  1.30*    Estimated Creatinine Clearance: 70 mL/min (A) (by C-G formula based on SCr of 1.3 mg/dL (H)).   Anticoag: on IV heparin for new afib. DOAC copay $280 too expensive for patient to afford, started warfarin 6/14.  INR plateaued at 1.68. Watch INR on amiodarone. Hgb stable 11s, no bleeding noted.  - Thoracentesis 6/9 - Heparin stopped 6/17, resumed 6/18  Heparin level at goal 0.4 on 1800 units/hr, no issues per nursing.  Warfarin not given 6/17  Goal of Therapy:  INR goal 2-3 HL 0.3-0.7 Monitor platelets by anticoagulation protocol: Yes   Plan:  Continue Heparin drip 1800 uts/hr  Warfarin 10mg  tonight Daily Protime, CBC, HL  Erin Hearing PharmD., BCPS Clinical Pharmacist Pager [phone] 03/30/2017 9:16 AM

## 2017-03-30 NOTE — NC FL2 (Signed)
[place] LEVEL OF CARE SCREENING TOOL     IDENTIFICATION  Patient Name: Christian Garner Birthdate: 1946/09/13 Sex: male Admission Date (Current Location): 03/19/2017  [name] and [place] Number:  Herbalist and Address:  [facility] [place] [address] [place], [place] [address]      Provider Number: [id number]  Attending Physician Name and Address:  Allie Bossier, MD  Relative Name and Phone Number:       Current Level of Care: Hospital Recommended Level of Care: [place] Prior Approval Number:    Date Approved/Denied:   PASRR Number: [id number]  Discharge Plan: SNF    Current Diagnoses: Patient Active Problem List   Diagnosis Date Noted  . Hyponatremia   . Pressure injury of skin 03/20/2017  . Acute on chronic combined systolic and diastolic CHF (congestive heart failure) ([place]) 03/19/2017  . Acute on chronic respiratory failure with hypoxia and hypercapnia ([place]) 03/19/2017  . AF (paroxysmal atrial fibrillation) ([place]) 03/19/2017  . Community acquired pneumonia 03/19/2017  . Pleural effusion   . AKI (acute kidney injury) ([place])     Orientation RESPIRATION BLADDER Height & Weight     Self, Situation, Time, Place  O2 (2L) Continent Weight: 251 lb 12.8 oz (114.2 kg) Height:  6\' 2"  (188 cm)  BEHAVIORAL SYMPTOMS/MOOD NEUROLOGICAL BOWEL NUTRITION STATUS   (Exhibits some confusion at times)   Continent Diet (Heart healthy/carb modified; Thin fluid; Fluid restriction-1500 mL )  AMBULATORY STATUS COMMUNICATION OF NEEDS Skin   Limited Assist Verbally PU Stage and Appropriate Care PU Stage 1 Dressing:  (Bilateral buttocks; foam dressing changed every 3 days)                     Personal Care Assistance Level of Assistance  Bathing, Feeding, Dressing Bathing Assistance: Limited assistance Feeding assistance: Independent Dressing Assistance: Limited assistance     Functional Limitations Info  Sight,  Hearing, Speech Sight Info: Adequate Hearing Info: Adequate Speech Info: Adequate    SPECIAL CARE FACTORS FREQUENCY  PT (By licensed PT)     PT Frequency: 5x              Contractures Contractures Info: Not present    Additional Factors Info  Code Status, Allergies, Insulin Sliding Scale Code Status Info: DNR Allergies Info: No known allergies   Insulin Sliding Scale Info: See MAR       Current Medications (03/30/2017):  This is the current hospital active medication list Current Facility-Administered Medications  Medication Dose Route Frequency Provider Last Rate Last Dose  . acetaminophen (TYLENOL) tablet 650 mg  650 mg Oral Q6H PRN Edwin Dada, MD   650 mg at 03/30/17 0743   Or  . acetaminophen (TYLENOL) suppository 650 mg  650 mg Rectal Q6H PRN Edwin Dada, MD      . amiodarone (PACERONE) tablet 400 mg  400 mg Oral BID Jettie Booze E, NP   400 mg at 03/30/17 1022  . aspirin EC tablet 81 mg  81 mg Oral Daily Jettie Booze E, NP   81 mg at 03/30/17 1023  . atorvastatin (LIPITOR) tablet 40 mg  40 mg Oral QHS Danford, Suann Larry, MD   40 mg at 03/29/17 2200  . furosemide (LASIX) tablet 40 mg  40 mg Oral BID Jettie Booze E, NP   40 mg at 03/30/17 1714  . insulin aspart (novoLOG) injection 0-20 Units  0-20 Units Subcutaneous TID WC  [name], MD   4 Units at 03/30/17 1714  . insulin aspart (novoLOG) injection 0-5 Units  0-5 Units Subcutaneous QHS [name], MD   2 Units at 03/25/17 2215  . insulin glargine (LANTUS) injection 10 Units  10 Units Subcutaneous Daily Allie Bossier, MD   10 Units at 03/30/17 1024  . losartan (COZAAR) tablet 12.5 mg  12.5 mg Oral Daily Jettie Booze E, NP   12.5 mg at 03/30/17 1023  . milrinone (PRIMACOR) 20 MG/100 ML (0.2 mg/mL) infusion  0.0625 mcg/kg/min Intravenous Continuous Bensimhon, Shaune Pascal, MD 2.1 mL/hr at 03/30/17 0930 0.0625 mcg/kg/min at 03/30/17 0930  . ondansetron (ZOFRAN) tablet 4 mg  4 mg Oral  Q6H PRN Danford, Suann Larry, MD       Or  . ondansetron (ZOFRAN) injection 4 mg  4 mg Intravenous Q6H PRN Danford, Suann Larry, MD      . sodium chloride flush (NS) 0.9 % injection 10-40 mL  10-40 mL Intracatheter Q12H [name], MD   10 mL at 03/30/17 1024  . sodium chloride flush (NS) 0.9 % injection 10-40 mL  10-40 mL Intracatheter PRN [name], MD      . spironolactone (ALDACTONE) tablet 25 mg  25 mg Oral Daily Shirley Friar, PA-C   25 mg at 03/30/17 1022  . Warfarin - Pharmacist Dosing Inpatient   Does not apply q1800 Allie Bossier, MD         Discharge Medications: Please see discharge summary for a list of discharge medications.  Relevant Imaging Results:  Relevant Lab Results:   Additional Information SSN: [id number]  Truitt Merle, LCSW

## 2017-03-30 NOTE — Clinical Social Work Placement (Signed)
   CLINICAL SOCIAL WORK PLACEMENT  NOTE  Date:  03/30/2017  Patient Details  Name: Christian Garner MRN: [id number] Date of Birth: 27-Jul-1946  Clinical Social Work is seeking post-discharge placement for this patient at the [place] level of care (*CSW will initial, date and re-position this form in  chart as items are completed):  Yes   Patient/family provided with [facility] Work Department's list of facilities offering this level of care within the geographic area requested by the patient (or if unable, by the patient's family).  Yes   Patient/family informed of their freedom to choose among providers that offer the needed level of care, that participate in Medicare, Medicaid or managed care program needed by the patient, have an available bed and are willing to accept the patient.  Yes   Patient/family informed of [facility]'s ownership interest in [facility] and [facility], as well as of the fact that they are under no obligation to receive care at these facilities.  PASRR submitted to EDS on 03/30/17     PASRR number received on 03/30/17     Existing PASRR number confirmed on       FL2 transmitted to all facilities in geographic area requested by pt/family on 03/30/17     FL2 transmitted to all facilities within larger geographic area on       Patient informed that his/her managed care company has contracts with or will negotiate with certain facilities, including the following:            Patient/family informed of bed offers received.  Patient chooses bed at       Physician recommends and patient chooses bed at      Patient to be transferred to   on  .  Patient to be transferred to facility by       Patient family notified on   of transfer.  Name of family member notified:        PHYSICIAN Please sign FL2, Please sign DNR, Please prepare prescriptions     Additional Comment:     _______________________________________________ Truitt Merle, LCSW 03/30/2017, 6:39 PM

## 2017-03-30 NOTE — Progress Notes (Signed)
ANTICOAGULATION CONSULT NOTE - Follow Up Consult  Pharmacy Consult for Coumadin / heparin  Indication: atrial fibrillation  INR ordered by provider. Resulted at 2.19, d/w cardiology will d/c heparin and given rise in INR will reduce warfarin dose for tonight.  Erin Hearing PharmD., BCPS Clinical Pharmacist Pager [phone] 03/30/2017 3:51 PM

## 2017-03-30 NOTE — Clinical Social Work Note (Signed)
Clinical Social Work Assessment  Patient Details  Name: Christian Garner MRN: [id number] Date of Birth: Sep 28, 1946  Date of referral:  03/30/17               Reason for consult:  Discharge Planning                Permission sought to share information with:  Family Supports Permission granted to share information::     Name::     Mazin Emma  Agency::  SNFs  Relationship::  Spouse  Contact Information:  [phone]  Housing/Transportation Living arrangements for the past 2 months:  [place] of Information:  Patient Patient Interpreter Needed:  None Criminal Activity/Legal Involvement Pertinent to Current Situation/Hospitalization:  No - Comment as needed Significant Relationships:  Spouse, Adult Children Lives with:  Spouse Do you feel safe going back to the place where you live?  Yes Need for family participation in patient care:  Yes (Comment)  Care giving concerns:  No care giving concerns identified.    Social Worker assessment / plan:  CSW met with pt to address consult for "discuss short-term/long-term care options when patient discharged. Patient with severe CHF, poor prognosis per cardiology notes, wife and daughter request meeting; Nursing home placement." CSW introduced self and explained role of social work. Pt from home and lives with spouse. Pt did not give CSW persmission to contact spouse/daughter, but did take CSW contact number and will give to family. P/T is recommending STR at SNF. CSW explained discharging to SNF with a Medicare. CSW provided SNF listing for review. Pt agreeable to SNF for STR. Pt prefers [facility], but agreed for CSW to fax out to all of [facility].    CSW will sent FL-2 to SNFs and will follow up on potential bed offer. CSW will continue to follow.   Employment status:  Retired Forensic scientist:  Medicare PT Recommendations:  [place] / Referral to community  resources:  [place]  Patient/Family's Response to care:  Pt was appreciative of CSW support and guidance.   Patient/Family's Understanding of and Emotional Response to Diagnosis, Current Treatment, and Prognosis:  CSW unclear as to pt's understanding of medical state. Family appears to be supportive.   Emotional Assessment Appearance:  Appears stated age Attitude/Demeanor/Rapport:  Other (Appropriate) Affect (typically observed):  Accepting, Adaptable Orientation:  Oriented to Self, Oriented to Place, Oriented to  Time, Oriented to Situation Alcohol / Substance use:  Other Psych involvement (Current and /or in the community):  No (Comment)  Discharge Needs  Concerns to be addressed:  Care Coordination, Discharge Planning Concerns Readmission within the last 30 days:  No Current discharge risk:  Dependent with Mobility, Chronically ill Barriers to Discharge:  Continued Medical Work up   [employer], LCSW 03/30/2017, 6:14 PM

## 2017-03-30 NOTE — Progress Notes (Signed)
SATURATION QUALIFICATIONS: (This note is used to comply with regulatory documentation for home oxygen)  Patient Saturations on Room Air at Rest = 86%  Patient Saturations on Room Air while Ambulating = 83%  Patient Saturations on 4 Liters of oxygen while Ambulating = 90%  Please briefly explain why patient needs home oxygen:

## 2017-03-30 NOTE — Progress Notes (Signed)
Inpatient Diabetes Program Recommendations  AACE/ADA: New Consensus Statement on Inpatient Glycemic Control (2015)  Target Ranges:  Prepandial:   less than 140 mg/dL      Peak postprandial:   less than 180 mg/dL (1-2 hours)      Critically ill patients:  140 - 180 mg/dL  Results for DYLEN, MCELHANNON (MRN [id number]) as of 03/30/2017 10:30  Ref. Range 03/29/2017 07:47 03/29/2017 13:15 03/29/2017 16:31 03/29/2017 22:14 03/30/2017 07:59  Glucose-Capillary Latest Ref Range: 65 - 99 mg/dL 136 (H) 170 (H) 124 (H) 149 (H) 127 (H)   Results for GENARO, BEKKER (MRN [id number]) as of 03/30/2017 10:30  Ref. Range 03/24/2017 04:04  Hemoglobin A1C Latest Ref Range: 4.8 - 5.6 % 6.7 (H)   Review of Glycemic Control  Diabetes history: DM2 Outpatient Diabetes medications: Januvia 100 mg daily Current orders for Inpatient glycemic control: Lantus 10 units daily, Novolog 0-20 units TID with meals, Novolog 0-5 units QHS  Inpatient Diabetes Program Recommendations: Insulin: Agree with current insulin orders at this time. HgbA1C: A1C 6.7% on 03/24/17 indicating an average glucose of 146 mg/dl over the past 2-3 months. A1C indicates DM well controlled.  Diet: Added Carb Modified diet to current Heart Healthy diet which should improve post prandial glucose.  Thanks, Barnie Alderman, RN, MSN, CDE Diabetes Coordinator Inpatient Diabetes Program [phone] (Team Pager from 8am to 5pm)

## 2017-03-30 NOTE — Progress Notes (Signed)
Advanced Heart Failure Rounding Note   Subjective:    Admitted 03/19/17 with AF with RVR, complicated by low output HF and volume overload. Started on IV Amio + milrinone 6/11.   Had thoracentesis on 03/20/17 with 1.4L out. Converted to NSR 03/25/17. Milrinone weaned to 0.125 mcg yesterday, co ox this am is 47%, will repeat. CVP 6 . Weight down 16 pounds.   Echo 03/23/17 LVEF 30-35%, severe LAE, moderate RV dilation, moderate RAE.   Frustrated this morning, does not want to stay in the hospital. Seems agitated. Denies SOB, chest pain.   LHC 03/2016 at [place]- Multivessel CAD. Severe stenosis of the LAD Moderate stenosis of the Circumflex; No change from prior study. No restenosis of the RCA . Moderate, Severe global LV systolic dysfunction. LV ejection fraction is 25-30 % Interventional Summary Successful PCI / Xience Drug Eluting Stent of the mid Left Anterior Descending Coronary Artery.   Objective:   Weight Range: 267 - 251 pounds.   Vital Signs:   Temp:  [97.4 F (36.3 C)-98.5 F (36.9 C)] 97.4 F (36.3 C) (06/19 0309) Pulse Rate:  [72-80] 76 (06/19 0309) Resp:  [14-25] 19 (06/19 0309) BP: (97-121)/(55-68) 108/55 (06/19 0309) SpO2:  [90 %-97 %] 97 % (06/19 0309) Weight:  [251 lb 12.8 oz (114.2 kg)] 251 lb 12.8 oz (114.2 kg) (06/19 0309) Last BM Date: 03/26/17  Weight change: Filed Weights   03/28/17 0514 03/29/17 0547 03/30/17 0309  Weight: 254 lb 9.6 oz (115.5 kg) 253 lb 4.9 oz (114.9 kg) 251 lb 12.8 oz (114.2 kg)    Intake/Output:   Intake/Output Summary (Last 24 hours) at 03/30/17 0720 Last data filed at 03/30/17 0000  Gross per 24 hour  Intake           867.49 ml  Output             2001 ml  Net         -1133.51 ml     Physical Exam: CVP 6 General:Ill appearing male, NAD. Sitting in recliner.  HEENT: normal Neck: supple. JVP 6-8cm. Carotids 2+ bilat; no bruits. No thyromegaly or nodule noted. Cor: PMI nondisplaced. RRR, No M/G/R  noted Lungs: CTAB, normal effort. Abdomen: soft, non-tender, distended, no HSM. No bruits or masses. +BS  Extremities: no cyanosis, clubbing, rash. Unna boots on BLE, LUE PICC without erythema  Neuro: alert & orientedx3, cranial nerves grossly intact. moves all 4 extremities w/o difficulty. Affect anxious.     .   Telemetry: NSR in the 90's. Personally reviewed    Labs: Basic Metabolic Panel:  Recent Labs Lab 03/27/17 0332 03/27/17 0355 03/28/17 0300 03/29/17 0247 03/30/17 0402  NA 131* 132* 128* 128* 128*  K 4.2 4.4 3.9 4.0 4.0  CL 85* 86* 87* 89* 91*  CO2 33* 33* 30 28 28   GLUCOSE 141* 148* 202* 296* 252*  BUN 41* 41* 35* 30* 32*  CREATININE 1.51* 1.47* 1.38* 1.25* 1.30*  CALCIUM 9.2 9.0 8.4* 8.7* 8.4*  MG  --   --   --  2.2 2.3      CBC:  Recent Labs Lab 03/27/17 0332 03/27/17 0355 03/28/17 0300 03/29/17 0247 03/30/17 0402  WBC 14.0* 13.9* 11.5* 9.7 10.8*  HGB 11.8* 11.5* 10.4* 10.9* 11.2*  HCT 38.8* 37.6* 33.9* 35.0* 36.6*  MCV 94.2 94.0 93.1 92.6 92.0  PLT 234 213 179 190 218     BNP: BNP (last 3 results)  Recent Labs  12/26/16 0716 12/29/16 0355  BNP 412.4* 236.2*      Imaging: No results found.   Transthoracic Echocardiography 03/23/17 Study Conclusions  - Left ventricle: Inferior and posterior lateral hypokinesis Wall   thickness was increased in a pattern of severe LVH. Systolic   function was moderately to severely reduced. The estimated   ejection fraction was in the range of 30% to 35%. The study is   not technically sufficient to allow evaluation of LV diastolic   function. - Mitral valve: There was mild to moderate regurgitation. - Left atrium: The atrium was severely dilated. - Right ventricle: The cavity size was moderately dilated. - Right atrium: The atrium was moderately dilated. - Atrial septum: No defect or patent foramen ovale was identified. - Impressions: RV is dialted with septal flattening consisant wtih    cor pulmonale.  Impressions:  - RV is dialted with septal flattening consisant wtih cor   pulmonale.     Medications:     Scheduled Medications: . amiodarone  400 mg Oral BID  . atorvastatin  40 mg Oral QHS  . furosemide  40 mg Oral BID  . insulin aspart  0-20 Units Subcutaneous TID WC  . insulin aspart  0-5 Units Subcutaneous QHS  . insulin glargine  10 Units Subcutaneous Daily  . losartan  12.5 mg Oral Daily  . sodium chloride flush  10-40 mL Intracatheter Q12H  . spironolactone  25 mg Oral Daily  . Warfarin - Pharmacist Dosing Inpatient   Does not apply q1800    Infusions: . heparin 1,800 Units/hr (03/30/17 0500)  . milrinone 0.125 mcg/kg/min (03/29/17 2200)    PRN Medications: acetaminophen **OR** acetaminophen, ondansetron **OR** ondansetron (ZOFRAN) IV, sodium chloride flush   Assessment/Plan/Discussion:   Mr Serna is a 71 year old admitted from [facility] with new onset A fib and A/C systolic heart failure.   1. A Fib RVR-  New onset with unknown duration.  - No in NSR, converted on IV Amio.  - Continue po Amio 400 mg BID x 7 days, then Amio 200 mg BID.  - This patients CHA2DS2-VASc Score and unadjusted Ischemic Stroke Rate (% per year) is equal to 4.8 % stroke rate/year from a score of 4 Above score calculated as 1 point each if present [CHF, HTN, DM, Vascular=MI/PAD/Aortic Plaque, Age if 65-74, or Male], 2 points each if present [Age > 75, or Stroke/TIA/TE] - On warfarin for anticoagulation. INR 1.68, remains on heparin gtt.  2. A/C Systolic Heart Failure- ECHO 12/2016 EF 30-35%. RV mildly dilated. ICM: LHC in June 2017 at [facility], had DES placed to LAD. Also with DES to RCA in 2014.  - NYHA III - Volume status stable, continue po Lasix 40mg  BID.  - Co ox 47 today, will re draw for accuracy. If remains low, may need to go back to 0.41mcg Milrinone.   - Not good candidate for home inotropes or advanced therapies with limited insight. -> Dr. Haroldine Laws  discussed with Dr. Sherral Hammers.  - Continue Arlyce Harman 25mg .  - Continue losartan 12.5mg  daily, BP too soft to titrate any further.  3. A/C Respiratory Failure - Desaturates with ambulation, will need home O2.  - No change to current plan.  4. AKI on CKD: Creatinine March 2018 was 1.26. Creatinine peaked 2.06.  - Creatinine 1.3 today. Stable.  5. Hyperkalemia  -  Resolved.  6. R pleural Effusion - S/P Thoracentesis. - Chest x ray with persistent R pleural effusion, but no need for repeat thoracentesis.  - Stable.  7. CAD- Multivessel CAD  - s/p DES LAD 6/17, mod stenosis circumflex. - No signs or symptoms of ischemia.  - No beta blocker with low output.  - Plavix stopped.  - Restart ASA today.   7. LE cellulitis  - Resolved with ceftriaxone.  - No change to current plan.  8. Deconditioning - CR and PT  reccommending SNF.  - CSW working on placement in [employer].  9. Hyponatremia - Na stable at 128, continue to restrict free water.   Length of Stay: [place], NP  03/30/2017, 7:20 AM  Advanced Heart Failure Team Pager [phone] (M-F; [place])  Please contact [facility] for night-coverage after hours (4p -7a ) and weekends on [email or web address]  Patient seen and examined with Jettie Booze, NP. We discussed all aspects of the encounter. I agree with the assessment and plan as stated above.   He remains very tenuous. Co-ox back down on low-dose milrinone. Will repeat. If remains < 55% will need to discuss plan. He is not good candidate for home inotropes or advanced therapies but if has persistent shock despite conversion to NSR will need to discuss short-tem inotropes with him and his family.   Continue po diuretics and amio. INR 1.68. Continue heparin and warfarin. D/w PharmD.   Glori Bickers, MD  8:45 AM

## 2017-03-30 NOTE — Progress Notes (Signed)
PROGRESS NOTE    Christian Garner  NFA:[id number] DOB: [birth year] DOA: 03/19/2017 PCP: [name], MD   Brief Narrative:  71 y.o.WM PMHx Chronic Systolic and Diastolic CHF (EF 46%), home O2, CAD s/p PCI, CKD baseline Cr 1.3, and DMwho presented with 3-4 days of progressive dyspnea accompanied by leg swelling.  In the ED at [facility] an EKG noted atrial fibrillation w/ a HR of 115.  CXR noted a large R pleural effusion.  He was transferred to [facility].  Of note he had an admission to [facility] in March 2018 during which he required intubation as well as CRRT for acute renal failure w/ hyperkalemia and a large transudative pleural effusion.   Subjective: 6/19  A/O 4, sitting comfortably in chair, positive acute on chronic respiratory distress.Negative CP. Negative N/V. Patient now back on milrinone     Assessment & Plan:   Principal Problem:   Acute on chronic respiratory failure with hypoxia and hypercapnia (HCC) Active Problems:   AKI (acute kidney injury) (HCC)   Pleural effusion   Acute on chronic combined systolic and diastolic CHF (congestive heart failure) (HCC)   AF (paroxysmal atrial fibrillation) ([place])   Community acquired pneumonia   Pressure injury of skin   Hyponatremia   Acute on chronic hypoxic and hypercapnic respiratory failure -Appears to be primarily related to volume overload in the setting of a CHF exacerbation + pleural effusion  -Resolving. Now on 2 L O2  -SATURATION QUALIFICATIONS: (This note is used to comply with regulatory documentation for home oxygen) Patient Saturations on Room Air at Rest = 86% Patient Saturations on Room Air while Ambulating = 83% Patient Saturations on 4 Liters of oxygen while Ambulating = 90% Please briefly explain why patient needs home oxygen:  -Home oxygen ordered  Newly appreciated atrial fibrillation with RVR(CHA2DS2-VASc Score 4) - Amiodarone 400 mg BID  - Patient now in NSR appears will not require TEE/DC-CV    -Coumadin per pharmacy Recent Labs Lab 03/27/17 0332 03/27/17 0355 03/28/17 0300 03/29/17 0247 03/30/17 0218 03/30/17 1311  INR 1.32 1.34 1.99 1.68 1.68 2.19   Acute on chronic systolic and diastolic congestive heart failure -Echocardiogram: Ejection fraction 30% -35% see results below  -Discussed case with Dr. Haroldine Laws CHF team, and patient has completed diuresis. Will begin to titrate milrinone off starting today. Patient poor candidate for home milrinone per CHF team. Also Discussed with patient and family.  - Continue spiro 25 mg daily.  - No BB with low output.  -Patient back on Milrinone drip per CHF team   -LCSW and Palliative care consulted to discuss short-term/long-term care options when patient discharged.  CAD with multiple stents -DES 03/2016  Recurrent Right-sided pleural effusion -Most likely related to CHF  - 6/9 ultrasound-guided thoracentesis yielding ~1.4L -Repeat CXR on 6/16 shows small residual effusion. Repeat thoracentesis not required.  Possible Right lower lobe pneumonia Procalcitonin not consistent with acute bacterial infection - discontinued antibiotics 6/9   Acute renal failure on CKD stage II (Creatinine 1.29 at discharge 01/05/17) Lab Results  Component Value Date   CREATININE 1.28 (H) 03/30/2017   CREATININE 1.30 (H) 03/30/2017   CREATININE 1.25 (H) 03/29/2017  -Continues to improve with diuresis   Hyperkalemia/Hypokalemia -Potassium goal > 4   -Stable continue to monitor closely   Acute urinary retention -Foley in place - likely related to BPH - follow  DM Type 2 Controlled with renal complication -9/62 Hemoglobin A1c= 6.7 -Lipid panel; within ADA guidelines -Lantus 10 units  daily -Resistant SSI      DVT prophylaxis: Coumadin Per pharmacy Code Status: DO NOT RESUSCITATE Family Communication: None Disposition Plan: Per cardiology   Consultants:  Cardiology CHF team   Procedures/Significant Events:  6/12  Echocardiogram:Left ventricle: Inferior and posterior lateral hypokinesis Severe LVH.  -LVEF =30% to 35%. - Mitral valve:  mild to moderate regurgitation. - Left atrium: severely dilated.-- Right ventricle:  moderately dilated. - Right atrium:  moderately dilated. - RV is dialted with septal flattening consisant wtih cor pulmonale.   VENTILATOR SETTINGS: NA   Cultures   Antimicrobials: Anti-infectives    Start     Stop   03/20/17 2200  levofloxacin (LEVAQUIN) IVPB 500 mg  Status:  Discontinued     03/20/17 1108   03/20/17 0030  levofloxacin (LEVAQUIN) IVPB 750 mg     03/20/17 0215       Devices NA   LINES / TUBES:      Continuous Infusions: . milrinone 0.0625 mcg/kg/min (03/30/17 0930)     Objective: Vitals:   03/30/17 1130 03/30/17 1200 03/30/17 1541 03/30/17 1600  BP: (!) 109/57 (!) 98/58 (!) 120/58 103/72  Pulse: 75 75 84 82  Resp: 19 (!) 21 (!) 21 (!) 24  Temp: 97.7 F (36.5 C)  97.4 F (36.3 C)   TempSrc: Oral  Oral   SpO2: 95% 96% 100% 95%  Weight:      Height:        Intake/Output Summary (Last 24 hours) at 03/30/17 1746 Last data filed at 03/30/17 1606  Gross per 24 hour  Intake           713.52 ml  Output             1301 ml  Net          -587.48 ml   Filed Weights   03/28/17 0514 03/29/17 0547 03/30/17 0309  Weight: 254 lb 9.6 oz (115.5 kg) 253 lb 4.9 oz (114.9 kg) 251 lb 12.8 oz (114.2 kg)    Examination:  General:A/O 4,, positive acute on chronic respiratory distress(Improvined, new baseline) Eyes: negative scleral hemorrhage, negative anisocoria, negative icterus ENT: Negative Runny nose, negative gingival bleeding, Neck:  Negative scars, masses, torticollis, lymphadenopathy, JVD Lungs: Clear to auscultation bilaterally without wheezes or crackles Cardiovascular: Regular rate and rhythm without murmur gallop or rub normal S1 and S2 Abdomen: Obese, negative abdominal pain, nondistended, positive soft, bowel sounds, no rebound, no  ascites, no appreciable mass Extremities: positive cyanosis, positive bilateral lower extremity edema 1+ to thighs bilateral Unna boots in place  Skin: Negative rashes, lesions, ulcers Psychiatric:  Negative depression, negative anxiety, negative fatigue, negative mania  Central nervous system:  Cranial nerves II through XII intact, tongue/uvula midline, all extremities muscle strength 5/5, sensation intact throughout,  negative dysarthria, negative expressive aphasia, negative receptive aphasia.  .     Data Reviewed: Care during the described time interval was provided by me .  I have reviewed this patient's available data, including medical history, events of note, physical examination, and all test results as part of my evaluation. I have personally reviewed and interpreted all radiology studies.  CBC:  Recent Labs Lab 03/27/17 0332 03/27/17 0355 03/28/17 0300 03/29/17 0247 03/30/17 0402  WBC 14.0* 13.9* 11.5* 9.7 10.8*  HGB 11.8* 11.5* 10.4* 10.9* 11.2*  HCT 38.8* 37.6* 33.9* 35.0* 36.6*  MCV 94.2 94.0 93.1 92.6 92.0  PLT 234 213 179 190 245   Basic Metabolic Panel:  Recent Labs Lab 03/27/17 0355  03/28/17 0300 03/29/17 0247 03/30/17 0402 03/30/17 1311  NA 132* 128* 128* 128* 129*  K 4.4 3.9 4.0 4.0 4.1  CL 86* 87* 89* 91* 91*  CO2 33* 30 28 28 29   GLUCOSE 148* 202* 296* 252* 145*  BUN 41* 35* 30* 32* 35*  CREATININE 1.47* 1.38* 1.25* 1.30* 1.28*  CALCIUM 9.0 8.4* 8.7* 8.4* 8.6*  MG  --   --  2.2 2.3  --    GFR: Estimated Creatinine Clearance: 71.1 mL/min (A) (by C-G formula based on SCr of 1.28 mg/dL (H)). Liver Function Tests: No results for input(s): AST, ALT, ALKPHOS, BILITOT, PROT, ALBUMIN in the last 168 hours. No results for input(s): LIPASE, AMYLASE in the last 168 hours. No results for input(s): AMMONIA in the last 168 hours. Coagulation Profile:  Recent Labs Lab 03/27/17 0355 03/28/17 0300 03/29/17 0247 03/30/17 0218 03/30/17 1311  INR 1.34  1.99 1.68 1.68 2.19   Cardiac Enzymes: No results for input(s): CKTOTAL, CKMB, CKMBINDEX, TROPONINI in the last 168 hours. BNP (last 3 results) No results for input(s): PROBNP in the last 8760 hours. HbA1C: No results for input(s): HGBA1C in the last 72 hours. CBG:  Recent Labs Lab 03/29/17 1631 03/29/17 2214 03/30/17 0759 03/30/17 1135 03/30/17 1545  GLUCAP 124* 149* 127* 190* 164*   Lipid Profile: No results for input(s): CHOL, HDL, LDLCALC, TRIG, CHOLHDL, LDLDIRECT in the last 72 hours. Thyroid Function Tests: No results for input(s): TSH, T4TOTAL, FREET4, T3FREE, THYROIDAB in the last 72 hours. Anemia Panel: No results for input(s): VITAMINB12, FOLATE, FERRITIN, TIBC, IRON, RETICCTPCT in the last 72 hours. Urine analysis:    Component Value Date/Time   COLORURINE YELLOW 03/19/2017 2359   APPEARANCEUR HAZY (A) 03/19/2017 2359   LABSPEC 1.011 03/19/2017 2359   PHURINE 5.0 03/19/2017 2359   GLUCOSEU NEGATIVE 03/19/2017 2359   HGBUR LARGE (A) 03/19/2017 2359   BILIRUBINUR NEGATIVE 03/19/2017 2359   KETONESUR NEGATIVE 03/19/2017 2359   PROTEINUR NEGATIVE 03/19/2017 2359   NITRITE NEGATIVE 03/19/2017 2359   LEUKOCYTESUR NEGATIVE 03/19/2017 2359   Sepsis Labs: @LABRCNTIP (procalcitonin:4,lacticidven:4)  ) No results found for this or any previous visit (from the past 240 hour(s)).       Radiology Studies: No results found.      Scheduled Meds: . amiodarone  400 mg Oral BID  . aspirin EC  81 mg Oral Daily  . atorvastatin  40 mg Oral QHS  . furosemide  40 mg Oral BID  . insulin aspart  0-20 Units Subcutaneous TID WC  . insulin aspart  0-5 Units Subcutaneous QHS  . insulin glargine  10 Units Subcutaneous Daily  . losartan  12.5 mg Oral Daily  . sodium chloride flush  10-40 mL Intracatheter Q12H  . spironolactone  25 mg Oral Daily  . Warfarin - Pharmacist Dosing Inpatient   Does not apply q1800   Continuous Infusions: . milrinone 0.0625 mcg/kg/min  (03/30/17 0930)     LOS: 11 days    Time spent: 40 minutes    Christina Waldrop, Geraldo Docker, MD [facility] Pager [phone]   If 7PM-7AM, please contact night-coverage [email or web address] Password [facility] 03/30/2017, 5:46 PM

## 2017-03-31 DIAGNOSIS — Z515 Encounter for palliative care: Secondary | ICD-10-CM

## 2017-03-31 DIAGNOSIS — Z7189 Other specified counseling: Secondary | ICD-10-CM

## 2017-03-31 LAB — BASIC METABOLIC PANEL
ANION GAP: 11 (ref 5–15)
BUN: 38 mg/dL — ABNORMAL HIGH (ref 6–20)
CALCIUM: 8.6 mg/dL — AB (ref 8.9–10.3)
CO2: 27 mmol/L (ref 22–32)
Chloride: 91 mmol/L — ABNORMAL LOW (ref 101–111)
Creatinine, Ser: 1.35 mg/dL — ABNORMAL HIGH (ref 0.61–1.24)
GFR, EST AFRICAN AMERICAN: 59 mL/min — AB (ref >60)
GFR, EST NON AFRICAN AMERICAN: 51 mL/min — AB (ref >60)
Glucose, Bld: 158 mg/dL — ABNORMAL HIGH (ref 65–99)
POTASSIUM: 5.1 mmol/L (ref 3.5–5.1)
SODIUM: 129 mmol/L — AB (ref 135–145)

## 2017-03-31 LAB — MAGNESIUM: MAGNESIUM: 2.3 mg/dL (ref 1.7–2.4)

## 2017-03-31 LAB — GLUCOSE, CAPILLARY
GLUCOSE-CAPILLARY: 159 mg/dL — AB (ref 65–99)
Glucose-Capillary: 144 mg/dL — ABNORMAL HIGH (ref 65–99)
Glucose-Capillary: 150 mg/dL — ABNORMAL HIGH (ref 65–99)
Glucose-Capillary: 147 mg/dL — ABNORMAL HIGH (ref 65–99)

## 2017-03-31 LAB — CBC
HEMATOCRIT: 37.5 % — AB (ref 39.0–52.0)
Hemoglobin: 11.5 g/dL — ABNORMAL LOW (ref 13.0–17.0)
MCH: 28.3 pg (ref 26.0–34.0)
MCHC: 30.7 g/dL (ref 30.0–36.0)
MCV: 92.1 fL (ref 78.0–100.0)
PLATELETS: 244 10*3/uL (ref 150–400)
RBC: 4.07 MIL/uL — ABNORMAL LOW (ref 4.22–5.81)
RDW: 15.8 % — AB (ref 11.5–15.5)
WBC: 10.8 10*3/uL — AB (ref 4.0–10.5)

## 2017-03-31 LAB — COOXEMETRY PANEL
CARBOXYHEMOGLOBIN: 1.3 % (ref 0.5–1.5)
Carboxyhemoglobin: 1.5 % (ref 0.5–1.5)
METHEMOGLOBIN: 1 % (ref 0.0–1.5)
Methemoglobin: 0.7 % (ref 0.0–1.5)
O2 Saturation: 37.2 %
O2 Saturation: 46.2 %
TOTAL HEMOGLOBIN: 11.8 g/dL — AB (ref 12.0–16.0)
Total hemoglobin: 11.9 g/dL — ABNORMAL LOW (ref 12.0–16.0)

## 2017-03-31 LAB — PROTIME-INR
INR: 2.02
Prothrombin Time: 23.2 seconds — ABNORMAL HIGH (ref 11.4–15.2)

## 2017-03-31 MED ORDER — MILRINONE LACTATE IN DEXTROSE 20-5 MG/100ML-% IV SOLN
0.2500 ug/kg/min | INTRAVENOUS | Status: DC
  Administered 2017-03-31 – 2017-04-01 (×2): 0.25 ug/kg/min via INTRAVENOUS
  Filled 2017-03-31 (×3): qty 100

## 2017-03-31 MED ORDER — WARFARIN SODIUM 5 MG PO TABS
5.0000 mg | ORAL_TABLET | Freq: Once | ORAL | Status: AC
  Administered 2017-03-31: 5 mg via ORAL
  Filled 2017-03-31: qty 1

## 2017-03-31 MED ORDER — SPIRONOLACTONE 25 MG PO TABS
12.5000 mg | ORAL_TABLET | Freq: Every day | ORAL | Status: DC
  Filled 2017-03-31: qty 1

## 2017-03-31 NOTE — Progress Notes (Signed)
Physical Therapy Treatment Patient Details Name: NILE PRISK MRN: [id number] DOB: [birth year] Today's Date: 03/31/2017    History of Present Illness Patient is a 71 y/o male who presents with dyspnea. CXR- showed what appeared to be a large right effusion with possible consolidation. ECG showed new atrial fibrillation. Recently admitted March 2017 with resp failure requiring intubation and AKI requiring CRRT. PMH includes CHF, EF 30-35%, CAD wth multi stents, DM, LE cellulitis.     PT Comments    Pt progressing towards physical therapy goals. Was able to perform transfers and ambulation with close guard for balance and safety. Pt required frequent cues for pursed-lip breathing as O2 sats dropping as low as 86% on 4L/min supplemental O2. Pt was left on 2L/min supplemental O2 with sats ranging in the low to mid 90's. Will continue to follow.    Follow Up Recommendations  SNF;Supervision for mobility/OOB;Supervision/Assistance - 24 hour     Equipment Recommendations  None recommended by PT    Recommendations for Other Services OT consult     Precautions / Restrictions Precautions Precautions: Fall Precaution Comments: watch O2, bump up to 4LO2 via [place] for activity Restrictions Weight Bearing Restrictions: No    Mobility  Bed Mobility               General bed mobility comments: up in chair upon PT arrival  Transfers Overall transfer level: Needs assistance Equipment used: Rolling walker (2 wheeled) Transfers: Sit to/from Stand Sit to Stand: Min guard         General transfer comment: v/c's for hand placement on seated surface for safety.  Ambulation/Gait Ambulation/Gait assistance: Min assist Ambulation Distance (Feet): 300 Feet Assistive device: Rolling walker (2 wheeled) Gait Pattern/deviations: Step-through pattern Gait velocity: Decreased Gait velocity interpretation: Below normal speed for age/gender General Gait Details: v/c's to stay in walker and  stand up straight, SpO2 dropping to 86% at times on 4LO2 via  when pt talking and not performing pursed-lip breathing. 1 seated rest break required due to fatigue and SOB.    Stairs            Wheelchair Mobility    Modified Rankin (Stroke Patients Only)       Balance Overall balance assessment: Needs assistance Sitting-balance support: Feet supported;No upper extremity supported Sitting balance-Leahy Scale: Fair     Standing balance support: During functional activity;Bilateral upper extremity supported Standing balance-Leahy Scale: Poor Standing balance comment: Reliant on BUEs for support in standing.                             Cognition Arousal/Alertness: Awake/alert Behavior During Therapy: WFL for tasks assessed/performed Overall Cognitive Status: No family/caregiver present to determine baseline cognitive functioning                                        Exercises      General Comments        Pertinent Vitals/Pain Pain Assessment: No/denies pain    Home Living                      Prior Function            PT Goals (current goals can now be found in the care plan section) Acute Rehab PT Goals Patient Stated Goal: improve my breathing PT Goal Formulation: With  patient Time For Goal Achievement: 04/07/17 Potential to Achieve Goals: Fair Progress towards PT goals: Progressing toward goals    Frequency    Min 2X/week      PT Plan Current plan remains appropriate    Co-evaluation              AM-PAC PT "6 Clicks" Daily Activity  Outcome Measure  Difficulty turning over in bed (including adjusting bedclothes, sheets and blankets)?: None Difficulty moving from lying on back to sitting on the side of the bed? : None Difficulty sitting down on and standing up from a chair with arms (e.g., wheelchair, bedside commode, etc,.)?: A Little Help needed moving to and from a bed to chair (including a  wheelchair)?: A Little Help needed walking in hospital room?: A Little Help needed climbing 3-5 steps with a railing? : A Lot 6 Click Score: 19    End of Session Equipment Utilized During Treatment: Oxygen;Gait belt Activity Tolerance: Patient tolerated treatment well Patient left: in chair;with call bell/phone within reach;with chair alarm set Nurse Communication: Mobility status PT Visit Diagnosis: Unsteadiness on feet (R26.81);Pain;Difficulty in walking, not elsewhere classified (R26.2);Muscle weakness (generalized) (M62.81) Pain - Right/Left: Left Pain - part of body: Hand     Time: 7096-4383 PT Time Calculation (min) (ACUTE ONLY): 35 min  Charges:  $Gait Training: 23-37 mins                    G Codes:       Rolinda Roan, PT, DPT Acute Rehabilitation Services Pager: [place] 03/31/2017, 11:45 AM

## 2017-03-31 NOTE — Progress Notes (Signed)
Daughter Ledell Noss work number: [phone] ask for [facility] and they will put you through to her office.She is there from 08.00am to 16.00 hr. Behr Cislo RN BSN, MSN, RN 3. 03/31/2017 @ 19.07

## 2017-03-31 NOTE — Progress Notes (Signed)
Advanced Heart Failure Rounding Note   Subjective:    Admitted 03/19/17 with AF with RVR, complicated by low output HF and volume overload. Started on IV Amio + milrinone 6/11. Converted to NSR on 03/25/17, milrinone weaned down to 0.0625 mcg yesterday, co ox 37% this morning, re draw 47%.   Weight up 4 pounds from yesterday, urine output sluggish overnight.  CVP 11. He feels tired, cannot sleep. Wants to go home so he can rest. Denies SOB at rest, he has walked in the halls without SOB.    Echo 03/23/17 LVEF 30-35%, severe LAE, moderate RV dilation, moderate RAE.  LHC 03/2016 at [facility]- Multivessel CAD. Severe stenosis of the LAD Moderate stenosis of the Circumflex; No change from prior study. No restenosis of the RCA . Moderate, Severe global LV systolic dysfunction. LV ejection fraction is 25-30 % Interventional Summary Successful PCI / Xience Drug Eluting Stent of the mid Left Anterior Descending Coronary Artery.   Objective:   Weight Range: 267 - 251 pounds.   Vital Signs:   Temp:  [97.4 F (36.3 C)-97.8 F (36.6 C)] 97.8 F (36.6 C) (06/19 2342) Pulse Rate:  [68-84] 79 (06/20 0400) Resp:  [14-35] 14 (06/20 0400) BP: (98-120)/(47-72) 100/60 (06/20 0400) SpO2:  [95 %-100 %] 99 % (06/20 0400) Weight:  [255 lb 14.4 oz (116.1 kg)] 255 lb 14.4 oz (116.1 kg) (06/20 0459) Last BM Date: 03/26/17  Weight change: Filed Weights   03/29/17 0547 03/30/17 0309 03/31/17 0459  Weight: 253 lb 4.9 oz (114.9 kg) 251 lb 12.8 oz (114.2 kg) 255 lb 14.4 oz (116.1 kg)    Intake/Output:   Intake/Output Summary (Last 24 hours) at 03/31/17 0720 Last data filed at 03/31/17 0600  Gross per 24 hour  Intake           780.55 ml  Output             1325 ml  Net          -544.45 ml     Physical Exam: CVP 11 General: Fatigued appearing male, NAD. Sitting in recliner.  HEENT: normal Neck: supple. JVP 8 cm. Carotids 2+ bilat; no bruits. No thyromegaly or nodule noted. Cor: PMI  nondisplaced. RRR, No M/G/R noted Lungs: CTAB, normal effort. Abdomen: soft, non-tender,+ distended, no HSM. No bruits or masses. +BS  Extremities: no cyanosis, clubbing, rash. Unna boots on BLE. LUE PICC.  Neuro: alert & orientedx3, cranial nerves grossly intact. moves all 4 extremities w/o difficulty. Affect pleasant   Telemetry: NSR. Personally reviewed    Labs: Basic Metabolic Panel:  Recent Labs Lab 03/28/17 0300 03/29/17 0247 03/30/17 0402 03/30/17 1311 03/31/17 0314  NA 128* 128* 128* 129* 129*  K 3.9 4.0 4.0 4.1 5.1  CL 87* 89* 91* 91* 91*  CO2 30 28 28 29 27   GLUCOSE 202* 296* 252* 145* 158*  BUN 35* 30* 32* 35* 38*  CREATININE 1.38* 1.25* 1.30* 1.28* 1.35*  CALCIUM 8.4* 8.7* 8.4* 8.6* 8.6*  MG  --  2.2 2.3  --  2.3      CBC:  Recent Labs Lab 03/27/17 0355 03/28/17 0300 03/29/17 0247 03/30/17 0402 03/31/17 0314  WBC 13.9* 11.5* 9.7 10.8* 10.8*  HGB 11.5* 10.4* 10.9* 11.2* 11.5*  HCT 37.6* 33.9* 35.0* 36.6* 37.5*  MCV 94.0 93.1 92.6 92.0 92.1  PLT 213 179 190 218 244     BNP: BNP (last 3 results)  Recent Labs  12/26/16 0716 12/29/16 0355  BNP 412.4*  236.2*      Imaging: No results found.   Transthoracic Echocardiography 03/23/17 Study Conclusions  - Left ventricle: Inferior and posterior lateral hypokinesis Wall   thickness was increased in a pattern of severe LVH. Systolic   function was moderately to severely reduced. The estimated   ejection fraction was in the range of 30% to 35%. The study is   not technically sufficient to allow evaluation of LV diastolic   function. - Mitral valve: There was mild to moderate regurgitation. - Left atrium: The atrium was severely dilated. - Right ventricle: The cavity size was moderately dilated. - Right atrium: The atrium was moderately dilated. - Atrial septum: No defect or patent foramen ovale was identified. - Impressions: RV is dialted with septal flattening consisant wtih   cor  pulmonale.  Impressions:  - RV is dialted with septal flattening consisant wtih cor   pulmonale.     Medications:     Scheduled Medications: . amiodarone  400 mg Oral BID  . aspirin EC  81 mg Oral Daily  . atorvastatin  40 mg Oral QHS  . furosemide  40 mg Oral BID  . insulin aspart  0-20 Units Subcutaneous TID WC  . insulin aspart  0-5 Units Subcutaneous QHS  . insulin glargine  10 Units Subcutaneous Daily  . losartan  12.5 mg Oral Daily  . sodium chloride flush  10-40 mL Intracatheter Q12H  . spironolactone  25 mg Oral Daily  . Warfarin - Pharmacist Dosing Inpatient   Does not apply q1800    Infusions: . milrinone 0.0625 mcg/kg/min (03/31/17 0319)    PRN Medications: acetaminophen **OR** acetaminophen, ondansetron **OR** ondansetron (ZOFRAN) IV, sodium chloride flush   Assessment/Plan/Discussion:   [name] is a 71 year old admitted from [facility] with new onset A fib and A/C systolic heart failure.   1. A Fib RVR-  New onset with unknown duration.  - Remains in NSR.  - Continue po Amio 400 mg BID x 7 days (day 3 today) , then Amio 200 mg BID.  - This patients CHA2DS2-VASc Score and unadjusted Ischemic Stroke Rate (% per year) is equal to 4.8 % stroke rate/year from a score of 4 Above score calculated as 1 point each if present [CHF, HTN, DM, Vascular=MI/PAD/Aortic Plaque, Age if 65-74, or Male], 2 points each if present [Age > 75, or Stroke/TIA/TE] - Continue warfarin for anticoagulation, INR goal 2-3, today's INR 2.02 2. A/C Systolic Heart Failure- ECHO 12/2016 EF 30-35%. RV mildly dilated. ICM: LHC in June 2017 at [facility], had DES placed to LAD. Also with DES to RCA in 2014.  - NYHA III - Volume status ok, but elevated from yesterday. Continue Lasix 40 mg BID.  - Co ox 37 this am, redraw 47%. He remains tenuous, BP soft, creatinine bumped today to 1.35. He is not a candidate for home inotropes. Palliative care saw yesterday.   - Stop milrinone today.    - Reduce spiro to 12.5mg  daily, K is 5.1.   - Continue losartan 12.5mg  daily.  3. A/C Respiratory Failure - Improved, remains on supplemental O2.  - No change to current plan.  4. AKI on CKD: Creatinine March 2018 was 1.26. Creatinine peaked 2.06.  - Creatinine bumped today, 1.28->1.35.  5. Hyperkalemia  - K 5.1 this am. Will reduce sprio to 12.5mg  daily.  6. R pleural Effusion - S/P Thoracentesis. - Chest x ray with persistent R pleural effusion, but no need for repeat thoracentesis.  -  Stable, no change to current plan.  7. CAD- Multivessel CAD  - s/p DES LAD 6/17, mod stenosis circumflex. - No signs or symptoms of ischemia.  - No beta blocker with low output.  - Plavix stopped.  - Restart ASA today.   - No change to current plan.  7. LE cellulitis  - Resolved.  8. Deconditioning - CR and PT  reccommending SNF.  - CSW working on placement in [place].  - Patient agrees to short term rehab.  9. Hyponatremia - Na stable at 129.   Length of Stay: Ren[place], NP  03/31/2017, 7:20 AM  Advanced Heart Failure Team Pager [phone] (M-F; [place])  Please contact [facility] for night-coverage after hours (4p -7a ) and weekends on [email or web address]  Patient seen and examined with Jettie Booze, NP. We discussed all aspects of the encounter. I agree with the assessment and plan as stated above.   Despite maintaining normal rhythm, he has recurrent cardiogenic shock after milrinone being turned down. Volume status looks good.   Long talk with him about possibility of home inotropes however he has very poor insight into his situation and asks the same questions repeatedly. Will have family meeting to discuss. Hospice may be only option.   Total time spent 40 minutes. Over half that time spent discussing above.   Glori Bickers, MD  11:25 AM

## 2017-03-31 NOTE — Progress Notes (Signed)
Patients Coox down to 37.2 from 53.8 yesterday, milrinone currently @ 0.0655mcg/kg/min. Text page to cardiology Dr. Overton Mam to make aware of drop in coox.   Update: 4:43 AM Per MD, as no clinical change in patient, will not make any changes to milrinone gtt at this time. Will continue to monitor.

## 2017-03-31 NOTE — Progress Notes (Signed)
CARDIAC REHAB PHASE I  Came to ambulate with pt, pt declined, states he recently walked with PT. Offered to return to walk with pt later today, pt declined, states he is not feeling so good today, states he has a lot to think about. Advised pt and RN to contact cardiac rehab should pt decide he wants to walk later today. Pt in recliner, call bell within reach, will follow.   Lenna Sciara, RN, BSN 03/31/2017 11:23 AM

## 2017-03-31 NOTE — Care Management Note (Signed)
Case Management Note  Patient Details  Name: Christian Garner MRN: [id number] Date of Birth: July 25, 1946  Subjective/Objective:  Pt admitted with CHf                 Action/Plan:   Pt initially told CM that he stays alone - however later told CM that he has a wife.  Pt appears confused - CM left voicemail for wife requesting call back.   Pt will benefit from PT/OT eval based on CM assessment - CM will request from attending.  CM spoke directly with bedside nurse and informed of pts mental status during assessment - nurse to reassess as this differs from her am assessement   Expected Discharge Date:  03/27/17               Expected Discharge Plan:     In-House Referral:     Discharge planning Services  CM Consult  Post Acute Care Choice:    Choice offered to:     DME Arranged:    DME Agency:     HH Arranged:    HH Agency:     Status of Service:     If discussed at [employer], dates discussed:    Additional Comments: 03/31/2017  Pt did not tolerated the discontinuation of Milrinone - HF team scheduled family meeting at bedside today at [place] confirmed with wife that both she and daughter will be present  03/30/17 Discussed in LOS 6/19 - pt remains appropriate for continued stay - pt remains on low dose Milrinone low co-ox  03/29/17 Pt is alert however orientation remains in question per CM assessment this am.  Pt is not able to recall conversations during the assessment.  CM spoke with pt regarding recommendation of SNF - pt is open to [place] speaking with him and offering list of CSW.  CSW consulted 03/27/17.  Palliative care also following - CM requested callback to discuss case [name], RN 03/31/2017, 10:18 AM

## 2017-03-31 NOTE — Progress Notes (Signed)
Palliative:  Mr. Delima is up walking today with PT when I came to see him. Noted that Dr. Haroldine Laws is meeting with family this afternoon and I think this will be very helpful to them. I did speak briefly with daughter, Ledell Noss, over the phone. I do believe that she has good understanding of his poor prognosis and I do believe that her parents, especially her mother, will need help in dealing with this. I did make her aware that he is not doing great even on the milrinone at this point. We will talk more tomorrow after they have the chance to meet with Dr. Haroldine Laws tonight to help with plans from here.   No charge  Vinie Sill, NP Palliative Medicine Team Pager # [phone] (M-F 8a-5p) Team Phone # [phone] (Nights/Weekends)

## 2017-03-31 NOTE — Progress Notes (Signed)
Christian Garner - Stepdown/ICU TEAM  Christian Garner  WEX:[id number] DOB: 1946/02/28 DOA: 03/19/2017 PCP: [name], MD    Brief Narrative:  71 y.o. male with a history of chronic systolic CHF (EF 93%), home O2, CAD s/p PCI, CKD baseline Cr Garner.3, and DM who presented with 3-4 days of progressive dyspnea accompanied by leg swelling.  In the ED at [facility] an EKG noted atrial fibrillation w/ a HR of 115.  CXR noted a large R pleural effusion.  He was transferred to [facility].  Of note he had an admission to [name] in March 2018 during which he required intubation as well as CRRT for acute renal failure w/ hyperkalemia and a large transudative pleural effusion.  Subjective: The patient is resting comfortably in a bedside chair.  He denies chest pain fevers chills nausea or vomiting.  He is quite pleasant and jovial but he does not appear to understand the serious nature of his present disease.  Assessment & Plan:  Acute hypoxic and hypercapnic respiratory failure Due to volume overload in the setting of a CHF exacerbation + pleural effusion - saturations relatively stable on minimal oxygen support at present  Newly appreciated atrial fibrillation with RVR Remains in NSR - Cards following - warfarin per Pharmacy continues   Acute exacerbation of chronic systolic and diastolic congestive heart failure Ejection fraction 30% via TTE March 2018 w/ grade 2 DD - aggressive care as per CHF team - has had signif difficulty w/ attempts to wean him off milrinone    Filed Weights   03/29/17 0547 03/30/17 0309 03/31/17 0459  Weight: 114.9 kg (253 lb 4.9 oz) 114.2 kg (251 lb 12.8 oz) 116.Garner kg (255 lb 14.4 oz)    Recurrent Right-sided pleural effusion Most likely related to CHF - ultrasound-guided thoracentesis yielding ~Garner.4L accomplished on 6/9 - not clinically significant at this time   Acute kidney injury on chronic kidney disease Creatinine Garner.29 at time of discharge 01/05/17 - creatinine at his  baseline     Recent Labs Lab 03/28/17 0300 03/29/17 0247 03/30/17 0402 03/30/17 1311 03/31/17 0314  CREATININE Garner.38* Garner.25* Garner.30* Garner.28* Garner.35*   Acute urinary retention likely related to BPH  DM2 CBG well controlled - d/c meal coverage and follow   CAD with multiple stents DES 03/2016  Recent hand injury Reports has been evaluated and fracture ruled out - follow clinically - signif ecchymosis noted but w/ good radial pulse and use of hand - follow with patient now on IV heparin  Hematuria Was evaluated by Urology for same during prior hospital stay - follow post foley d/c    DVT prophylaxis: warfarin  Code Status: NO CODE - DNR  Family Communication: no family present at time of exam  Disposition Plan: CHF Team to discuss goals of care and tx options w/ pt and family   Consultants:  CHF Team  Palliative Care   Procedures: None  Antimicrobials:  Levaquin 6/8   Objective: Blood pressure (!) 93/53, pulse 66, temperature 97.8 F (36.6 C), temperature source Oral, resp. rate 18, height 6\' 2"  (Garner.88 m), weight 116.Garner kg (255 lb 14.4 oz), SpO2 97 %.  Intake/Output Summary (Last 24 hours) at 03/31/17 1632 Last data filed at 03/31/17 1600  Gross per 24 hour  Intake           803.45 ml  Output             1175 ml  Net          -  371.55 ml   Filed Weights   03/29/17 0547 03/30/17 0309 03/31/17 0459  Weight: 114.9 kg (253 lb 4.9 oz) 114.2 kg (251 lb 12.8 oz) 116.Garner kg (255 lb 14.4 oz)    Examination: General:  No acute respiratory distress upright in chair  Lungs: decreased breath sounds in B bases - no wheezing  Cardiovascular: regular rate - no rub  Abdomen: Obese, soft, bowel sounds positive, no rebound Extremities: Garner+ bilateral lower extremity edema - unna boots in place B LE   CBC:  Recent Labs Lab 03/27/17 0355 03/28/17 0300 03/29/17 0247 03/30/17 0402 03/31/17 0314  WBC 13.9* 11.5* 9.7 10.8* 10.8*  HGB 11.5* 10.4* 10.9* 11.2* 11.5*  HCT 37.6* 33.9*  35.0* 36.6* 37.5*  MCV 94.0 93.Garner 92.6 92.0 92.Garner  PLT 213 179 190 218 607   Basic Metabolic Panel:  Recent Labs Lab 03/28/17 0300 03/29/17 0247 03/30/17 0402 03/30/17 1311 03/31/17 0314  NA 128* 128* 128* 129* 129*  K 3.9 4.0 4.0 4.Garner 5.Garner  CL 87* 89* 91* 91* 91*  CO2 30 28 28 29 27   GLUCOSE 202* 296* 252* 145* 158*  BUN 35* 30* 32* 35* 38*  CREATININE Garner.38* Garner.25* Garner.30* Garner.28* Garner.35*  CALCIUM 8.4* 8.7* 8.4* 8.6* 8.6*  MG  --  2.2 2.3  --  2.3   GFR: Estimated Creatinine Clearance: 68 mL/min (A) (by C-G formula based on SCr of Garner.35 mg/dL (H)).  CBG:  Recent Labs Lab 03/30/17 1545 03/30/17 2123 03/31/17 0735 03/31/17 1158 03/31/17 1619  GLUCAP 164* 161* 144* 150* 159*    Scheduled Meds: . amiodarone  400 mg Oral BID  . aspirin EC  81 mg Oral Daily  . atorvastatin  40 mg Oral QHS  . furosemide  40 mg Oral BID  . insulin aspart  0-20 Units Subcutaneous TID WC  . insulin aspart  0-5 Units Subcutaneous QHS  . insulin glargine  10 Units Subcutaneous Daily  . losartan  12.5 mg Oral Daily  . sodium chloride flush  10-40 mL Intracatheter Q12H  . [START ON 04/01/2017] spironolactone  12.5 mg Oral Daily  . warfarin  5 mg Oral ONCE-1800  . Warfarin - Pharmacist Dosing Inpatient   Does not apply q1800     LOS: 12 days   [name], MD [facility] Office  [phone] Pager - Text Page per Amion as per below:  On-Call/Text Page:      [email or web address]      password [email or web address]  If 7PM-7AM, please contact night-coverage [email or web address] Password [facility] 03/31/2017, 4:32 PM

## 2017-03-31 NOTE — Progress Notes (Signed)
ANTICOAGULATION CONSULT NOTE - Follow Up Consult  Pharmacy Consult for Coumadin Indication: atrial fibrillation  No Known Allergies  Patient Measurements: Height: 6\' 2"  (188 cm) Weight: 255 lb 14.4 oz (116.1 kg) IBW/kg (Calculated) : 82.2  Vital Signs: Temp: 97.6 F (36.4 C) (06/20 0700) Temp Source: Oral (06/20 0700) BP: 89/56 (06/20 0700) Pulse Rate: 125 (06/20 0700)  Labs:  Recent Labs  03/29/17 0247 03/29/17 2119 03/30/17 0218 03/30/17 0402 03/30/17 1311 03/31/17 0314  HGB 10.9*  --   --  11.2*  --  11.5*  HCT 35.0*  --   --  36.6*  --  37.5*  PLT 190  --   --  218  --  244  LABPROT 20.0*  --  20.0*  --  24.7* 23.2*  INR 1.68  --  1.68  --  2.19 2.02  HEPARINUNFRC  --  0.37 0.42  --   --   --   CREATININE 1.25*  --   --  1.30* 1.28* 1.35*    Estimated Creatinine Clearance: 68 mL/min (A) (by C-G formula based on SCr of 1.35 mg/dL (H)).  Assessment: 70yom continues on coumadin for new afib (DOAC copay too expensive). INR therapeutic at 2.02. Heparin bridge stopped yesterday. CBC stable. No bleeding. Continues on po amiodarone.   Goal of Therapy:  INR 2-3 Monitor platelets by anticoagulation protocol: Yes   Plan:  1) Coumadin 5mg  tonight 2) Daily INR  Deboraha Sprang 03/31/2017,11:55 AM

## 2017-04-01 LAB — BASIC METABOLIC PANEL
Anion gap: 7 (ref 5–15)
BUN: 44 mg/dL — ABNORMAL HIGH (ref 6–20)
CALCIUM: 8.7 mg/dL — AB (ref 8.9–10.3)
CHLORIDE: 91 mmol/L — AB (ref 101–111)
CO2: 31 mmol/L (ref 22–32)
CREATININE: 1.55 mg/dL — AB (ref 0.61–1.24)
GFR calc non Af Amer: 43 mL/min — ABNORMAL LOW (ref >60)
GFR, EST AFRICAN AMERICAN: 50 mL/min — AB (ref >60)
Glucose, Bld: 174 mg/dL — ABNORMAL HIGH (ref 65–99)
Potassium: 5 mmol/L (ref 3.5–5.1)
SODIUM: 129 mmol/L — AB (ref 135–145)

## 2017-04-01 LAB — CBC
HCT: 35.9 % — ABNORMAL LOW (ref 39.0–52.0)
Hemoglobin: 11 g/dL — ABNORMAL LOW (ref 13.0–17.0)
MCH: 28.2 pg (ref 26.0–34.0)
MCHC: 30.6 g/dL (ref 30.0–36.0)
MCV: 92.1 fL (ref 78.0–100.0)
PLATELETS: 200 10*3/uL (ref 150–400)
RBC: 3.9 MIL/uL — AB (ref 4.22–5.81)
RDW: 16 % — ABNORMAL HIGH (ref 11.5–15.5)
WBC: 10.5 10*3/uL (ref 4.0–10.5)

## 2017-04-01 LAB — COOXEMETRY PANEL
CARBOXYHEMOGLOBIN: 1.8 % — AB (ref 0.5–1.5)
METHEMOGLOBIN: 0.9 % (ref 0.0–1.5)
O2 SAT: 49 %
Total hemoglobin: 11.6 g/dL — ABNORMAL LOW (ref 12.0–16.0)

## 2017-04-01 LAB — GLUCOSE, CAPILLARY
GLUCOSE-CAPILLARY: 126 mg/dL — AB (ref 65–99)
Glucose-Capillary: 202 mg/dL — ABNORMAL HIGH (ref 65–99)
Glucose-Capillary: 118 mg/dL — ABNORMAL HIGH (ref 65–99)
Glucose-Capillary: 179 mg/dL — ABNORMAL HIGH (ref 65–99)

## 2017-04-01 LAB — PROTIME-INR
INR: 2.36
Prothrombin Time: 26.3 seconds — ABNORMAL HIGH (ref 11.4–15.2)

## 2017-04-01 LAB — MAGNESIUM: MAGNESIUM: 2.4 mg/dL (ref 1.7–2.4)

## 2017-04-01 MED ORDER — FUROSEMIDE 10 MG/ML IJ SOLN
40.0000 mg | Freq: Once | INTRAMUSCULAR | Status: AC
  Administered 2017-04-01: 40 mg via INTRAVENOUS
  Filled 2017-04-01: qty 4

## 2017-04-01 MED ORDER — WARFARIN SODIUM 5 MG PO TABS
5.0000 mg | ORAL_TABLET | Freq: Once | ORAL | Status: AC
  Administered 2017-04-01: 5 mg via ORAL
  Filled 2017-04-01: qty 1

## 2017-04-01 MED ORDER — FUROSEMIDE 10 MG/ML IJ SOLN
80.0000 mg | Freq: Two times a day (BID) | INTRAMUSCULAR | Status: DC
  Administered 2017-04-01 – 2017-04-02 (×2): 80 mg via INTRAVENOUS
  Filled 2017-04-01 (×2): qty 8

## 2017-04-01 NOTE — Progress Notes (Signed)
ANTICOAGULATION CONSULT NOTE - Follow Up Consult  Pharmacy Consult for Coumadin Indication: atrial fibrillation  No Known Allergies  Patient Measurements: Height: 6\' 2"  (188 cm) Weight: 257 lb 4.4 oz (116.7 kg) IBW/kg (Calculated) : 82.2  Vital Signs: Temp: 97.7 F (36.5 C) (06/21 1200) Temp Source: Oral (06/21 1200) BP: 101/50 (06/21 1200) Pulse Rate: 72 (06/21 1200)  Labs:  Recent Labs  03/29/17 2119  03/30/17 0218  03/30/17 0402 03/30/17 1311 03/31/17 0314 04/01/17 0311  HGB  --   --   --   < > 11.2*  --  11.5* 11.0*  HCT  --   --   --   --  36.6*  --  37.5* 35.9*  PLT  --   --   --   --  218  --  244 200  LABPROT  --   < > 20.0*  --   --  24.7* 23.2* 26.3*  INR  --   < > 1.68  --   --  2.19 2.02 2.36  HEPARINUNFRC 0.37  --  0.42  --   --   --   --   --   CREATININE  --   --   --   < > 1.30* 1.28* 1.35* 1.55*  < > = values in this interval not displayed.  Estimated Creatinine Clearance: 59.4 mL/min (A) (by C-G formula based on SCr of 1.55 mg/dL (H)).  Assessment: 70yom continues on coumadin for new afib (DOAC copay too expensive). INR therapeutic at 2.36. Heparin bridge stopped 6/19. CBC stable. No bleeding. Continues on po amiodarone.   Goal of Therapy:  INR 2-3 Monitor platelets by anticoagulation protocol: Yes   Plan:  1) Coumadin 5mg  tonight 2) Daily INR  Deboraha Sprang 04/01/2017,2:40 PM

## 2017-04-01 NOTE — Progress Notes (Signed)
CARDIAC REHAB PHASE I   PRE:  Rate/Rhythm: 74 SR    BP: sitting 90/50    SaO2: 94 2L  MODE:  Ambulation: 120 ft   POST:  Rate/Rhythm: 84 SR    BP: sitting 88/63     SaO2: 95 2L after 65min  Pt stood and urinated then able to walk with RW. Fairly steady, no major c/o. Used RW, 2L O2, assist x2. Did not want to increase distance. Return to recliner. SaO2 slow to register but then 95 2L. Will f/u. [phone]   [place], ACSM 04/01/2017 11:35 AM

## 2017-04-01 NOTE — Progress Notes (Signed)
Advanced Heart Failure Rounding Note   Subjective:    Admitted 03/19/17 with AF with RVR, complicated by low output HF and volume overload. Started on IV Amio + milrinone 6/11. Converted to NSR on 03/25/17.  Remains in NSR. Attempted milrinone wean but co ox down to 37%. Milrinone now back at 0.25 mcg. Co ox this am 49%. Creatinine 1.25->1.30->1.28->1.35->1.55. Weight trending up. CVP 7.   Has many questions this morning about his health. He continues to have poor insight into his condition. Denies SOB, chest pain and palpitations.   Echo 03/23/17 LVEF 30-35%, severe LAE, moderate RV dilation, moderate RAE.  LHC 03/2016 at [place]- Multivessel CAD. Severe stenosis of the LAD Moderate stenosis of the Circumflex; No change from prior study. No restenosis of the RCA . Moderate, Severe global LV systolic dysfunction. LV ejection fraction is 25-30 % Interventional Summary Successful PCI / Xience Drug Eluting Stent of the mid Left Anterior Descending Coronary Artery.   Objective:   Weight: 257 pounds.   Vital Signs:   Temp:  [97.7 F (36.5 C)-98.1 F (36.7 C)] 97.7 F (36.5 C) (06/21 0425) Pulse Rate:  [66-99] 78 (06/21 0425) Resp:  [13-28] 13 (06/21 0425) BP: (89-127)/(44-91) 123/68 (06/21 0425) SpO2:  [91 %-100 %] 100 % (06/21 0425) Weight:  [257 lb 4.4 oz (116.7 kg)] 257 lb 4.4 oz (116.7 kg) (06/21 0544) Last BM Date: 03/26/17  Weight change: Filed Weights   03/30/17 0309 03/31/17 0459 04/01/17 0544  Weight: 251 lb 12.8 oz (114.2 kg) 255 lb 14.4 oz (116.1 kg) 257 lb 4.4 oz (116.7 kg)    Intake/Output:   Intake/Output Summary (Last 24 hours) at 04/01/17 0734 Last data filed at 04/01/17 0600  Gross per 24 hour  Intake           654.45 ml  Output             1300 ml  Net          -645.55 ml     Physical Exam: CVP 7 General: Ill appearing male, NAD.  HEENT: Normal Neck: Supple. JVP 5-6. Carotids 2+ bilat; no bruits. No thyromegaly or nodule noted. Cor: PMI  nondisplaced. RRR, No M/G/R noted Lungs: CTAB, normal effort. Abdomen: Soft, non-tender, non-distended, no HSM. No bruits or masses. +BS  Extremities: No cyanosis, clubbing, rash, Unna boots in place on BLE, 3+ edema above, LUE PICC.  Neuro: Alert & orientedx3, cranial nerves grossly intact. moves all 4 extremities w/o difficulty. Affect pleasant    Telemetry: NSR     Labs: Basic Metabolic Panel:  Recent Labs Lab 03/29/17 0247 03/30/17 0402 03/30/17 1311 03/31/17 0314 04/01/17 0311  NA 128* 128* 129* 129* 129*  K 4.0 4.0 4.1 5.1 5.0  CL 89* 91* 91* 91* 91*  CO2 28 28 29 27 31   GLUCOSE 296* 252* 145* 158* 174*  BUN 30* 32* 35* 38* 44*  CREATININE 1.25* 1.30* 1.28* 1.35* 1.55*  CALCIUM 8.7* 8.4* 8.6* 8.6* 8.7*  MG 2.2 2.3  --  2.3 2.4      CBC:  Recent Labs Lab 03/28/17 0300 03/29/17 0247 03/30/17 0402 03/31/17 0314 04/01/17 0311  WBC 11.5* 9.7 10.8* 10.8* 10.5  HGB 10.4* 10.9* 11.2* 11.5* 11.0*  HCT 33.9* 35.0* 36.6* 37.5* 35.9*  MCV 93.1 92.6 92.0 92.1 92.1  PLT 179 190 218 244 200     BNP: BNP (last 3 results)  Recent Labs  12/26/16 0716 12/29/16 0355  BNP 412.4* 236.2*  Imaging: No results found.   Transthoracic Echocardiography 03/23/17 Study Conclusions  - Left ventricle: Inferior and posterior lateral hypokinesis Wall   thickness was increased in a pattern of severe LVH. Systolic   function was moderately to severely reduced. The estimated   ejection fraction was in the range of 30% to 35%. The study is   not technically sufficient to allow evaluation of LV diastolic   function. - Mitral valve: There was mild to moderate regurgitation. - Left atrium: The atrium was severely dilated. - Right ventricle: The cavity size was moderately dilated. - Right atrium: The atrium was moderately dilated. - Atrial septum: No defect or patent foramen ovale was identified. - Impressions: RV is dialted with septal flattening consisant wtih    cor pulmonale.  Impressions:  - RV is dialted with septal flattening consisant wtih cor   pulmonale.     Medications:     Scheduled Medications: . amiodarone  400 mg Oral BID  . aspirin EC  81 mg Oral Daily  . atorvastatin  40 mg Oral QHS  . furosemide  40 mg Oral BID  . insulin aspart  0-20 Units Subcutaneous TID WC  . insulin aspart  0-5 Units Subcutaneous QHS  . insulin glargine  10 Units Subcutaneous Daily  . losartan  12.5 mg Oral Daily  . sodium chloride flush  10-40 mL Intracatheter Q12H  . spironolactone  12.5 mg Oral Daily  . Warfarin - Pharmacist Dosing Inpatient   Does not apply q1800    Infusions: . milrinone 0.25 mcg/kg/min (04/01/17 0334)    PRN Medications: acetaminophen **OR** acetaminophen, ondansetron **OR** ondansetron (ZOFRAN) IV, sodium chloride flush   Assessment/Plan/Discussion:   Christian Garner is a 71 year old admitted from [name] with new onset A fib and A/C systolic heart failure.   1. A Fib RVR: New onset this admission with unknown duration.  - Remains in NSR.  - Continue po Amio 400 mg BID x 7 days (day 4 of this dose) , then Amio 200 mg BID.  - This patients CHA2DS2-VASc Score and unadjusted Ischemic Stroke Rate (% per year) is equal to 4.8 % stroke rate/year from a score of 4 Above score calculated as 1 point each if present [CHF, HTN, DM, Vascular=MI/PAD/Aortic Plaque, Age if 65-74, or Male], 2 points each if present [Age > 75, or Stroke/TIA/TE] - Continue warfarin for anticoagulation. INR 2.36 2. A/C Systolic Heart Failure- ECHO 12/2016 EF 30-35%. RV mildly dilated. ICM: LHC in June 2017 at [facility], had DES placed to LAD. Also with DES to RCA in 2014.  - NYHA III - Volume status elevated on exam.  - Give 40mg  IV lasix now.  - Continue milrinone 0.25 mcg, will discuss with MD regarding home milrinone.  - Stop Arlyce Harman. K is 5.0 - Continue losartan 12.5mg  daily.  3. A/C Respiratory Failure - Remains on supplemental O2.  4. AKI  on CKD: Creatinine March 2018 was 1.26. Creatinine peaked 2.06.  - Creatinine worsening.  5. Hyperkalemia  - K 5.0. Arlyce Harman stopped as above.   6. R pleural Effusion - S/P Thoracentesis. - Chest x ray with persistent R pleural effusion, but no need for repeat thoracentesis.  - Stable, no change to current plan.  7. CAD- Multivessel CAD  - s/p DES LAD 6/17, mod stenosis circumflex. - No signs or symptoms of ischemia.  - No beta blocker with low output.  - Plavix stopped.  - Continue ASA.  7. LE cellulitis  - Resolved.  8. Deconditioning - CR and PT  reccommending SNF.  - CSW working on placement in [place].  - Patient agrees to short term rehab.  - no change to current plan.  9. Hyponatremia - Restrict free water 10. Goals of care - Will discuss with family today. Palliative care appreciated.   Length of Stay: [place], NP  04/01/2017, 7:34 AM  Advanced Heart Failure Team Pager [phone] (M-F; 7a - 4p)  Please contact [facility] for night-coverage after hours (4p -7a ) and weekends on [email or web address]  Patient seen and examined with Jettie Booze, NP. We discussed all aspects of the encounter. I agree with the assessment and plan as stated above.   Co-ox remains low today despite being back on milrinone. Volume status back up slightly.   Long talk with him and his daughter today. (Daughter = Christian Garner [phone]). I informed them that it appeared that milrinone is no longer working well for him and I doubt the possible benefit would be worth the risk. We have decided to stop milrinone and not restart. If he feels poorly in am then we can readdress going home with Hospice, If he feels well in am, can go to SNF to see if he will improve. If not improving then daughter would consider taking him home with Hospice with the understanding that he may only have a few weeks to live. Continue amio and warfarin. Suspect he will need lasix 80 po bid on d/c.  Total time spent 45  minutes. Over half that time spent discussing above.   Glori Bickers, MD  5:56 PM

## 2017-04-01 NOTE — Care Management Note (Signed)
Case Management Note  Patient Details  Name: Christian Garner MRN: [id number] Date of Birth: Oct 25, 1945  Subjective/Objective:  Pt admitted with CHf                 Action/Plan:   Pt initially told CM that he stays alone - however later told CM that he has a wife.  Pt appears confused - CM left voicemail for wife requesting call back.   Pt will benefit from PT/OT eval based on CM assessment - CM will request from attending.  CM spoke directly with bedside nurse and informed of pts mental status during assessment - nurse to reassess as this differs from her am assessement   Expected Discharge Date:  03/27/17               Expected Discharge Plan:     In-House Referral:     Discharge planning Services  CM Consult  Post Acute Care Choice:    Choice offered to:     DME Arranged:    DME Agency:     HH Arranged:    HH Agency:     Status of Service:     If discussed at [employer], dates discussed:    Additional Comments: 04/01/2017  Family meeting held with HF team - plan is to d/c Milrinone today and possibly discharge to SNF tomorrow if he remains stable  03/31/17 Pt did not tolerated the discontinuation of Milrinone - HF team scheduled family meeting at bedside today at [place] confirmed with wife that both she and daughter will be present  03/30/17 Discussed in LOS 6/19 - pt remains appropriate for continued stay - pt remains on low dose Milrinone low co-ox  03/29/17 Pt is alert however orientation remains in question per CM assessment this am.  Pt is not able to recall conversations during the assessment.  CM spoke with pt regarding recommendation of SNF - pt is open to [place] speaking with him and offering list of CSW.  CSW consulted 03/27/17.  Palliative care also following - CM requested callback to discuss case [name], RN 04/01/2017, 1:47 PM

## 2017-04-01 NOTE — Progress Notes (Signed)
Palliative:  Attempted to reach daughter, Ledell Noss, for continued support but no answer. Discussed with Junie Panning, NP heart failure team. Inclined to believe that home with hospice would be my recommendation as likely limited time left. Daughter wishes to respect his wishes for home but unable to provide ongoing care for long term period of time.   No charge  Vinie Sill, NP Palliative Medicine Team Pager # [phone] (M-F 8a-5p) Team Phone # [phone] (Nights/Weekends)

## 2017-04-01 NOTE — Progress Notes (Signed)
Patient ID: Christian Garner, male   DOB: 10-17-1945, 71 y.o.   MRN: [id number]                                                                PROGRESS NOTE                                                                                                                                                                                                             Patient Demographics:    Christian Garner, is a 71 y.o. male, DOB - 08-11-1946, DJS:[id number]  Admit date - 03/19/2017   Admitting Physician Norval Morton, MD  Outpatient Primary MD for the patient is [name], MD  LOS - 13  Outpatient Specialists:  No chief complaint on file.      Brief Narrative  71 y.o.malewith a history of chronic systolic CHF (EF 88%), home O2, CAD s/p PCI, CKD baseline Cr 1.3, and DMwho presented with 3-4 days of progressive dyspnea accompanied by leg swelling.  In the ED at [facility] an EKG noted atrial fibrillation w/ a HR of 115.  CXR noted a large R pleural effusion.  He was transferred to [facility].  Of note he had an admission to [facility] in March 2018 during which he required intubation as well as CRRT for acute renal failure w/ hyperkalemia and a large transudative pleural effusion.    Subjective:    Christian Garner today has been feeling well. Denies cp, palp, sob.  , No headache, No abdominal pain - No Nausea, No new weakness tingling or numbness, No Cough -    Assessment  & Plan :    Principal Problem:   Acute on chronic respiratory failure with hypoxia and hypercapnia (HCC) Active Problems:   AKI (acute kidney injury) ([place])   Pleural effusion   Acute on chronic combined systolic and diastolic CHF (congestive heart failure) (HCC)   AF (paroxysmal atrial fibrillation) ([place])   Community acquired pneumonia   Pressure injury of skin   Hyponatremia   Goals of care, counseling/discussion   Palliative care encounter   Acute hypoxic and hypercapnic respiratory failure Due to volume  overload in the setting of a CHF exacerbation + pleural effusion - saturations relatively stable on minimal oxygen support at present  Newly appreciated  atrial fibrillation with RVR Remains in NSR - Cards following - warfarin per Pharmacy continues   Acute exacerbation of chronic systolic and diastolic congestive heart failure Ejection fraction 30% via TTE March 2018 w/ grade 2 DD - aggressive care as per CHF team - has had signif difficulty w/ attempts to wean him off milrinone    Recurrent Right-sided pleural effusion Most likely related to CHF - ultrasound-guided thoracentesis yielding ~1.4L accomplished on 6/9 - not clinically significant at this time   Hyponatremia Likely due to diuresis Check cmp in am  Acute kidney injury on chronic kidney disease Creatinine 1.29 at time of discharge 01/05/17 - creatinine at his baseline     Acute urinary retention likely related to BPH  DM2 CBG well controlled - d/c meal coverage and follow   CAD with multiple stents DES 03/2016  Recent hand injury Reports has been evaluated and fracture ruled out - follow clinically - signif ecchymosis noted but w/ good radial pulse and use of hand - follow with patient now on IV heparin  Hematuria Was evaluated by Urology for same during prior hospital stay - follow post foley d/c    DVT prophylaxis: warfarin  Code Status: NO CODE - DNR  Family Communication: no family present at time of exam  Disposition Plan: CHF Team to discuss goals of care and tx options w/ pt and family   Consultants:  CHF Team  Palliative Care   Procedures: None  Antimicrobials:  Levaquin 6/8    Lab Results  Component Value Date   PLT 200 04/01/2017      Anti-infectives    Start     Dose/Rate Route Frequency Ordered Stop   03/20/17 2200  levofloxacin (LEVAQUIN) IVPB 500 mg  Status:  Discontinued     500 mg 100 mL/hr over 60 Minutes Intravenous Every 24 hours 03/20/17 0113 03/20/17 1108    03/20/17 0030  levofloxacin (LEVAQUIN) IVPB 750 mg     750 mg 100 mL/hr over 90 Minutes Intravenous  Once 03/19/17 2357 03/20/17 0215        Objective:   Vitals:   04/01/17 0000 04/01/17 0400 04/01/17 0425 04/01/17 0544  BP: (!) 119/91 109/67 123/68   Pulse: 99 74 78   Resp: (!) 25 20 13    Temp:   97.7 F (36.5 C)   TempSrc:   Oral   SpO2:  100% 100%   Weight:    116.7 kg (257 lb 4.4 oz)  Height:        Wt Readings from Last 3 Encounters:  04/01/17 116.7 kg (257 lb 4.4 oz)  01/05/17 108.9 kg (240 lb 1.3 oz)     Intake/Output Summary (Last 24 hours) at 04/01/17 0803 Last data filed at 04/01/17 0600  Gross per 24 hour  Intake           654.45 ml  Output             1300 ml  Net          -645.55 ml     Physical Exam  Awake Alert, Oriented X 3, No new F.N deficits, Normal affect [place].AT,PERRAL Supple Neck,No JVD, No cervical lymphadenopathy appriciated.  Symmetrical Chest wall movement, Good air movement bilaterally, slight basailar crackle,  No wheezing.  RRR,No Gallops,Rubs or new Murmurs, No Parasternal Heave +ve B.Sounds, Abd Soft, No tenderness, No organomegaly appriciated, No rebound - guarding or rigidity. No Cyanosis, Clubbing or edema, No new Rash or bruise  Data Review:    CBC  Recent Labs Lab 03/28/17 0300 03/29/17 0247 03/30/17 0402 03/31/17 0314 04/01/17 0311  WBC 11.5* 9.7 10.8* 10.8* 10.5  HGB 10.4* 10.9* 11.2* 11.5* 11.0*  HCT 33.9* 35.0* 36.6* 37.5* 35.9*  PLT 179 190 218 244 200  MCV 93.1 92.6 92.0 92.1 92.1  MCH 28.6 28.8 28.1 28.3 28.2  MCHC 30.7 31.1 30.6 30.7 30.6  RDW 15.8* 15.6* 15.8* 15.8* 16.0*    Chemistries   Recent Labs Lab 03/29/17 0247 03/30/17 0402 03/30/17 1311 03/31/17 0314 04/01/17 0311  NA 128* 128* 129* 129* 129*  K 4.0 4.0 4.1 5.1 5.0  CL 89* 91* 91* 91* 91*  CO2 28 28 29 27 31   GLUCOSE 296* 252* 145* 158* 174*  BUN 30* 32* 35* 38* 44*  CREATININE 1.25* 1.30* 1.28* 1.35* 1.55*  CALCIUM 8.7* 8.4*  8.6* 8.6* 8.7*  MG 2.2 2.3  --  2.3 2.4   ------------------------------------------------------------------------------------------------------------------ No results for input(s): CHOL, HDL, LDLCALC, TRIG, CHOLHDL, LDLDIRECT in the last 72 hours.  Lab Results  Component Value Date   HGBA1C 6.7 (H) 03/24/2017   ------------------------------------------------------------------------------------------------------------------ No results for input(s): TSH, T4TOTAL, T3FREE, THYROIDAB in the last 72 hours.  Invalid input(s): FREET3 ------------------------------------------------------------------------------------------------------------------ No results for input(s): VITAMINB12, FOLATE, FERRITIN, TIBC, IRON, RETICCTPCT in the last 72 hours.  Coagulation profile  Recent Labs Lab 03/29/17 0247 03/30/17 0218 03/30/17 1311 03/31/17 0314 04/01/17 0311  INR 1.68 1.68 2.19 2.02 2.36    No results for input(s): DDIMER in the last 72 hours.  Cardiac Enzymes No results for input(s): CKMB, TROPONINI, MYOGLOBIN in the last 168 hours.  Invalid input(s): CK ------------------------------------------------------------------------------------------------------------------    Component Value Date/Time   BNP 236.2 (H) 12/29/2016 0355    Inpatient Medications  Scheduled Meds: . amiodarone  400 mg Oral BID  . aspirin EC  81 mg Oral Daily  . atorvastatin  40 mg Oral QHS  . furosemide  40 mg Oral BID  . insulin aspart  0-20 Units Subcutaneous TID WC  . insulin aspart  0-5 Units Subcutaneous QHS  . insulin glargine  10 Units Subcutaneous Daily  . losartan  12.5 mg Oral Daily  . sodium chloride flush  10-40 mL Intracatheter Q12H  . spironolactone  12.5 mg Oral Daily  . Warfarin - Pharmacist Dosing Inpatient   Does not apply q1800   Continuous Infusions: . milrinone 0.25 mcg/kg/min (04/01/17 0334)   PRN Meds:.acetaminophen **OR** acetaminophen, ondansetron **OR** ondansetron (ZOFRAN)  IV, sodium chloride flush  Micro Results No results found for this or any previous visit (from the past 240 hour(s)).  Radiology Reports Dg Chest 2 View  Result Date: 03/27/2017 CLINICAL DATA:  Pleural effusion. EXAM: CHEST  2 VIEW COMPARISON:  03/25/2017 FINDINGS: Heart is enlarged. There is a right pleural effusion, associated with basilar opacity. Diffuse bilateral interstitial edema. IMPRESSION: 1. Cardiomegaly and interstitial edema. 2. Persistent right pleural effusion and right basilar atelectasis or infiltrate. Electronically Signed   By: Nolon Nations M.D.   On: 03/27/2017 17:00   X-ray Chest Pa And Lateral  Result Date: 03/20/2017 CLINICAL DATA:  Status post right thoracentesis for a right pleural effusion. EXAM: CHEST  2 VIEW COMPARISON:  03/19/2017. FINDINGS: Significant decrease in right pleural fluid. No pneumothorax seen. Decreased right mid and lower lung zone airspace opacity. No gross change in enlargement of the cardiac silhouette. The interstitial markings are mildly prominent. Calcified aortic arch. Thoracic spine degenerative changes. IMPRESSION: 1. Decreased right pleural fluid without pneumothorax  following thoracentesis. 2. Improved atelectasis in the right mid and lower lung zones. 3. Stable cardiomegaly. 4. Mild chronic interstitial lung disease Electronically Signed   By: Claudie Revering M.D.   On: 03/20/2017 14:57   Dg Chest Port 1 View  Result Date: 03/25/2017 CLINICAL DATA:  Patient admitted 03/19/2017 with a history of chronic congestive heart failure and shortness of breath. Lower extremity swelling. EXAM: PORTABLE CHEST 1 VIEW COMPARISON:  Single-view of the chest 03/22/2017. PA and lateral chest 03/20/2017. FINDINGS: Right pleural effusion and basilar airspace disease have increased. Smaller left pleural effusion and basilar airspace disease appear mildly improved. No pneumothorax. There is cardiomegaly and vascular congestion. IMPRESSION: Improved scratch the  increased right effusion and basilar airspace disease. Much smaller left effusion basilar airspace disease appear improved. Electronically Signed   By: Inge Rise M.D.   On: 03/25/2017 07:44   Dg Chest Port 1 View  Result Date: 03/22/2017 CLINICAL DATA:  post insertion to confirm placement EXAM: PORTABLE CHEST 1 VIEW COMPARISON:  Chest x-ray from earlier same day. FINDINGS: Left-sided PICC line is been placed with tip well positioned at the level of the lower SVC/cavoatrial junction. Stable cardiomegaly. Atherosclerotic changes again noted at the aortic arch. Vague opacities are again seen at each lung base, right greater the left, atelectasis versus layering pleural effusions. At least mild interstitial edema bilaterally. IMPRESSION: 1. Left-sided PICC line appears well positioned with tip at the level of the lower SVC/cavoatrial junction. 2. No other change in the short-term interval. Probable small bilateral pleural effusions, right greater than left. Stable central pulmonary vascular congestion and interstitial edema. Stable cardiomegaly. 3. Aortic atherosclerosis. Electronically Signed   By: Franki Cabot M.D.   On: 03/22/2017 18:43   Dg Chest Port 1 View  Result Date: 03/22/2017 CLINICAL DATA:  71 year old male with pleural effusion. Subsequent encounter. EXAM: PORTABLE CHEST 1 VIEW COMPARISON:  02/17/2017. FINDINGS: Increase in size of right-sided pleural effusion. Pulmonary vascular congestion asymmetric greater on the right. Cardiomegaly. Calcified aorta. No pneumothorax. IMPRESSION: Increase in size of right-sided pleural effusion. Pulmonary vascular prominence greater on the right. Cardiomegaly. Aortic atherosclerosis. Electronically Signed   By: Genia Del M.D.   On: 03/22/2017 07:07   US Thoracentesis Asp Pleural Space W/img Guide  Result Date: 03/20/2017 INDICATION: Patient with right pleural effusion. Request is made for diagnostic and therapeutic right thoracentesis. EXAM:  ULTRASOUND GUIDED DIAGNOSTIC AND THERAPEUTIC RIGHT THORACENTESIS MEDICATIONS: 10 mL 1% lidocaine. COMPLICATIONS: None immediate. PROCEDURE: An ultrasound guided thoracentesis was thoroughly discussed with the patient and questions answered. The benefits, risks, alternatives and complications were also discussed. The patient understands and wishes to proceed with the procedure. Written consent was obtained. Ultrasound was performed to localize and mark an adequate pocket of fluid in the right chest. The area was then prepped and draped in the normal sterile fashion. 1% Lidocaine was used for local anesthesia. Under ultrasound guidance a Safe-T-Centesis catheter was introduced. Thoracentesis was performed. The catheter was removed and a dressing applied. FINDINGS: A total of approximately 1.4 liters of amber fluid was removed. Samples were sent to the laboratory as requested by the clinical team. IMPRESSION: Successful ultrasound guided diagnostic and therapeutic right thoracentesis yielding 1.4 liters of pleural fluid. Read by:  Brynda Greathouse PA-C Electronically Signed   By: Marybelle Killings M.D.   On: 03/20/2017 14:38    Time Spent in minutes  30   Jani Gravel M.D on 04/01/2017 at 8:03 AM  Between 7am to 7pm - Pager -  [phone]  After 7pm go to [email or web address] - password [email or web address]  [facility] -  Office  [phone]

## 2017-04-02 DIAGNOSIS — J181 Lobar pneumonia, unspecified organism: Secondary | ICD-10-CM | POA: Diagnosis not present

## 2017-04-02 DIAGNOSIS — J9622 Acute and chronic respiratory failure with hypercapnia: Secondary | ICD-10-CM | POA: Diagnosis not present

## 2017-04-02 DIAGNOSIS — N170 Acute kidney failure with tubular necrosis: Secondary | ICD-10-CM

## 2017-04-02 DIAGNOSIS — I5042 Chronic combined systolic (congestive) and diastolic (congestive) heart failure: Secondary | ICD-10-CM | POA: Diagnosis not present

## 2017-04-02 DIAGNOSIS — J9610 Chronic respiratory failure, unspecified whether with hypoxia or hypercapnia: Secondary | ICD-10-CM | POA: Diagnosis not present

## 2017-04-02 DIAGNOSIS — N183 Chronic kidney disease, stage 3 (moderate): Secondary | ICD-10-CM | POA: Diagnosis not present

## 2017-04-02 DIAGNOSIS — Z9889 Other specified postprocedural states: Secondary | ICD-10-CM | POA: Diagnosis not present

## 2017-04-02 DIAGNOSIS — R279 Unspecified lack of coordination: Secondary | ICD-10-CM | POA: Diagnosis not present

## 2017-04-02 DIAGNOSIS — E785 Hyperlipidemia, unspecified: Secondary | ICD-10-CM | POA: Diagnosis not present

## 2017-04-02 DIAGNOSIS — I5022 Chronic systolic (congestive) heart failure: Secondary | ICD-10-CM | POA: Diagnosis not present

## 2017-04-02 DIAGNOSIS — R2681 Unsteadiness on feet: Secondary | ICD-10-CM | POA: Diagnosis not present

## 2017-04-02 DIAGNOSIS — J189 Pneumonia, unspecified organism: Secondary | ICD-10-CM | POA: Diagnosis not present

## 2017-04-02 DIAGNOSIS — Z7982 Long term (current) use of aspirin: Secondary | ICD-10-CM | POA: Diagnosis not present

## 2017-04-02 DIAGNOSIS — Z955 Presence of coronary angioplasty implant and graft: Secondary | ICD-10-CM | POA: Diagnosis not present

## 2017-04-02 DIAGNOSIS — J90 Pleural effusion, not elsewhere classified: Secondary | ICD-10-CM | POA: Diagnosis not present

## 2017-04-02 DIAGNOSIS — Z9981 Dependence on supplemental oxygen: Secondary | ICD-10-CM | POA: Diagnosis not present

## 2017-04-02 DIAGNOSIS — E1169 Type 2 diabetes mellitus with other specified complication: Secondary | ICD-10-CM | POA: Diagnosis not present

## 2017-04-02 DIAGNOSIS — J9621 Acute and chronic respiratory failure with hypoxia: Secondary | ICD-10-CM | POA: Diagnosis not present

## 2017-04-02 DIAGNOSIS — L03116 Cellulitis of left lower limb: Secondary | ICD-10-CM

## 2017-04-02 DIAGNOSIS — I4510 Unspecified right bundle-branch block: Secondary | ICD-10-CM | POA: Diagnosis not present

## 2017-04-02 DIAGNOSIS — Z794 Long term (current) use of insulin: Secondary | ICD-10-CM | POA: Diagnosis not present

## 2017-04-02 DIAGNOSIS — I5040 Unspecified combined systolic (congestive) and diastolic (congestive) heart failure: Secondary | ICD-10-CM | POA: Diagnosis not present

## 2017-04-02 DIAGNOSIS — I130 Hypertensive heart and chronic kidney disease with heart failure and stage 1 through stage 4 chronic kidney disease, or unspecified chronic kidney disease: Secondary | ICD-10-CM | POA: Diagnosis not present

## 2017-04-02 DIAGNOSIS — R0902 Hypoxemia: Secondary | ICD-10-CM | POA: Diagnosis not present

## 2017-04-02 DIAGNOSIS — I5043 Acute on chronic combined systolic (congestive) and diastolic (congestive) heart failure: Secondary | ICD-10-CM | POA: Diagnosis not present

## 2017-04-02 DIAGNOSIS — I5084 End stage heart failure: Secondary | ICD-10-CM | POA: Diagnosis not present

## 2017-04-02 DIAGNOSIS — N179 Acute kidney failure, unspecified: Secondary | ICD-10-CM | POA: Diagnosis not present

## 2017-04-02 DIAGNOSIS — I5032 Chronic diastolic (congestive) heart failure: Secondary | ICD-10-CM | POA: Diagnosis not present

## 2017-04-02 DIAGNOSIS — Z7901 Long term (current) use of anticoagulants: Secondary | ICD-10-CM | POA: Diagnosis not present

## 2017-04-02 DIAGNOSIS — R2243 Localized swelling, mass and lump, lower limb, bilateral: Secondary | ICD-10-CM | POA: Diagnosis not present

## 2017-04-02 DIAGNOSIS — N182 Chronic kidney disease, stage 2 (mild): Secondary | ICD-10-CM

## 2017-04-02 DIAGNOSIS — M6281 Muscle weakness (generalized): Secondary | ICD-10-CM | POA: Diagnosis not present

## 2017-04-02 DIAGNOSIS — I480 Paroxysmal atrial fibrillation: Secondary | ICD-10-CM | POA: Diagnosis not present

## 2017-04-02 DIAGNOSIS — E1129 Type 2 diabetes mellitus with other diabetic kidney complication: Secondary | ICD-10-CM | POA: Diagnosis not present

## 2017-04-02 DIAGNOSIS — E1122 Type 2 diabetes mellitus with diabetic chronic kidney disease: Secondary | ICD-10-CM | POA: Diagnosis not present

## 2017-04-02 DIAGNOSIS — R262 Difficulty in walking, not elsewhere classified: Secondary | ICD-10-CM | POA: Diagnosis not present

## 2017-04-02 DIAGNOSIS — I2510 Atherosclerotic heart disease of native coronary artery without angina pectoris: Secondary | ICD-10-CM | POA: Diagnosis not present

## 2017-04-02 DIAGNOSIS — J9600 Acute respiratory failure, unspecified whether with hypoxia or hypercapnia: Secondary | ICD-10-CM | POA: Diagnosis not present

## 2017-04-02 DIAGNOSIS — Z7401 Bed confinement status: Secondary | ICD-10-CM | POA: Diagnosis not present

## 2017-04-02 DIAGNOSIS — E875 Hyperkalemia: Secondary | ICD-10-CM | POA: Diagnosis not present

## 2017-04-02 DIAGNOSIS — J918 Pleural effusion in other conditions classified elsewhere: Secondary | ICD-10-CM | POA: Diagnosis not present

## 2017-04-02 DIAGNOSIS — Z87891 Personal history of nicotine dependence: Secondary | ICD-10-CM | POA: Diagnosis not present

## 2017-04-02 DIAGNOSIS — R278 Other lack of coordination: Secondary | ICD-10-CM | POA: Diagnosis not present

## 2017-04-02 LAB — CBC
HCT: 37.4 % — ABNORMAL LOW (ref 39.0–52.0)
Hemoglobin: 11.4 g/dL — ABNORMAL LOW (ref 13.0–17.0)
MCH: 28.3 pg (ref 26.0–34.0)
MCHC: 30.5 g/dL (ref 30.0–36.0)
MCV: 92.8 fL (ref 78.0–100.0)
PLATELETS: 234 10*3/uL (ref 150–400)
RBC: 4.03 MIL/uL — ABNORMAL LOW (ref 4.22–5.81)
RDW: 16.2 % — AB (ref 11.5–15.5)
WBC: 10.9 10*3/uL — AB (ref 4.0–10.5)

## 2017-04-02 LAB — COMPREHENSIVE METABOLIC PANEL
ALK PHOS: 72 U/L (ref 38–126)
ALT: 31 U/L (ref 17–63)
ANION GAP: 8 (ref 5–15)
AST: 25 U/L (ref 15–41)
Albumin: 3 g/dL — ABNORMAL LOW (ref 3.5–5.0)
BILIRUBIN TOTAL: 0.6 mg/dL (ref 0.3–1.2)
BUN: 50 mg/dL — ABNORMAL HIGH (ref 6–20)
CALCIUM: 8.5 mg/dL — AB (ref 8.9–10.3)
CO2: 30 mmol/L (ref 22–32)
Chloride: 91 mmol/L — ABNORMAL LOW (ref 101–111)
Creatinine, Ser: 1.58 mg/dL — ABNORMAL HIGH (ref 0.61–1.24)
GFR calc non Af Amer: 42 mL/min — ABNORMAL LOW (ref >60)
GFR, EST AFRICAN AMERICAN: 49 mL/min — AB (ref >60)
GLUCOSE: 172 mg/dL — AB (ref 65–99)
POTASSIUM: 5.5 mmol/L — AB (ref 3.5–5.1)
Sodium: 129 mmol/L — ABNORMAL LOW (ref 135–145)
Total Protein: 6.9 g/dL (ref 6.5–8.1)

## 2017-04-02 LAB — PROTIME-INR
INR: 2.5
Prothrombin Time: 27.5 seconds — ABNORMAL HIGH (ref 11.4–15.2)

## 2017-04-02 LAB — COOXEMETRY PANEL
CARBOXYHEMOGLOBIN: 1.4 % (ref 0.5–1.5)
CARBOXYHEMOGLOBIN: 1.2 % (ref 0.5–1.5)
METHEMOGLOBIN: 1 % (ref 0.0–1.5)
Methemoglobin: 0.7 % (ref 0.0–1.5)
O2 SAT: 45.9 %
O2 Saturation: 39.5 %
TOTAL HEMOGLOBIN: 12.4 g/dL (ref 12.0–16.0)
Total hemoglobin: 12.1 g/dL (ref 12.0–16.0)

## 2017-04-02 LAB — GLUCOSE, CAPILLARY
Glucose-Capillary: 197 mg/dL — ABNORMAL HIGH (ref 65–99)
Glucose-Capillary: 130 mg/dL — ABNORMAL HIGH (ref 65–99)

## 2017-04-02 LAB — MAGNESIUM: MAGNESIUM: 2.4 mg/dL (ref 1.7–2.4)

## 2017-04-02 MED ORDER — INSULIN GLARGINE 100 UNITS/ML SOLOSTAR PEN: 10.0000 [IU] | PEN_INJECTOR | Freq: Every day | SUBCUTANEOUS | 0 refills | Status: DC

## 2017-04-02 MED ORDER — INSULIN ASPART 100 UNIT/ML ~~LOC~~ SOLN: 0.0000 [IU] | Freq: Three times a day (TID) | SUBCUTANEOUS | 0 refills | Status: DC

## 2017-04-02 MED ORDER — FUROSEMIDE 80 MG PO TABS
80.0000 mg | ORAL_TABLET | Freq: Two times a day (BID) | ORAL | Status: DC
  Administered 2017-04-02: 80 mg via ORAL
  Filled 2017-04-02: qty 1

## 2017-04-02 MED ORDER — INSULIN ASPART 100 UNIT/ML ~~LOC~~ SOLN: 0.0000 [IU] | Freq: Every day | SUBCUTANEOUS | 0 refills | Status: DC

## 2017-04-02 MED ORDER — AMIODARONE HCL 200 MG PO TABS: 200.0000 mg | ORAL_TABLET | Freq: Two times a day (BID) | ORAL | 0 refills | Status: AC

## 2017-04-02 MED ORDER — WARFARIN SODIUM 5 MG PO TABS: 5.0000 mg | ORAL_TABLET | Freq: Once | ORAL | 0 refills | Status: DC

## 2017-04-02 MED ORDER — ASPIRIN EC 81 MG PO TBEC: 81.0000 mg | DELAYED_RELEASE_TABLET | Freq: Every day | ORAL | 0 refills | Status: DC

## 2017-04-02 MED ORDER — WARFARIN SODIUM 5 MG PO TABS
5.0000 mg | ORAL_TABLET | Freq: Once | ORAL | Status: AC
  Administered 2017-04-02: 5 mg via ORAL
  Filled 2017-04-02: qty 1

## 2017-04-02 MED ORDER — ONDANSETRON HCL 4 MG PO TABS: 4.0000 mg | ORAL_TABLET | Freq: Four times a day (QID) | ORAL | 0 refills | Status: DC | PRN

## 2017-04-02 MED ORDER — FUROSEMIDE 80 MG PO TABS
80.0000 mg | ORAL_TABLET | Freq: Two times a day (BID) | ORAL | 0 refills | Status: DC
Start: 2017-04-02 — End: 2017-04-12

## 2017-04-02 MED ORDER — AMIODARONE HCL 400 MG PO TABS: 400.0000 mg | ORAL_TABLET | Freq: Two times a day (BID) | ORAL | 0 refills | Status: DC

## 2017-04-02 MED ORDER — "PEN NEEDLES 1/2"" 29G X 12MM MISC": 10.0000 [IU] | Freq: Every day | 0 refills | Status: DC

## 2017-04-02 NOTE — Progress Notes (Signed)
Advanced Heart Failure Rounding Note   Subjective:    Admitted 03/19/17 with AF with RVR, complicated by low output HF and volume overload. Started on IV Amio + milrinone 6/11. Converted to NSR on 03/25/17.  Remains in NSR. Attempted to wean milrinone but developed recurrent shock. Palliative consult obtained, GOC discussed. Will not go home with milrinone.   Co ox 40 %, redraw 46%. Creatinine 1.28->1.35->1.55->1.58. Weight trending up. CVP 14.   Says he feels ok today, denies SOB at rest. He is anxious today. Denies chest pain, palpitations.   Echo 03/23/17 LVEF 30-35%, severe LAE, moderate RV dilation, moderate RAE.  LHC 03/2016 at [place]- Multivessel CAD. Severe stenosis of the LAD Moderate stenosis of the Circumflex; No change from prior study. No restenosis of the RCA . Moderate, Severe global LV systolic dysfunction. LV ejection fraction is 25-30 % Interventional Summary Successful PCI / Xience Drug Eluting Stent of the mid Left Anterior Descending Coronary Artery.   Objective:   Weight: 259 pounds.   Vital Signs:   Temp:  [97.3 F (36.3 C)-98.6 F (37 C)] 98.6 F (37 C) (06/22 0344) Pulse Rate:  [69-77] 72 (06/22 0344) Resp:  [16-21] 21 (06/22 0344) BP: (81-101)/(50-64) 90/57 (06/22 0344) SpO2:  [95 %-100 %] 98 % (06/22 0344) FiO2 (%):  [2 %] 2 % (06/22 0344) Last BM Date: 04/01/17  Weight change: Filed Weights   03/30/17 0309 03/31/17 0459 04/01/17 0544  Weight: 251 lb 12.8 oz (114.2 kg) 255 lb 14.4 oz (116.1 kg) 257 lb 4.4 oz (116.7 kg)    Intake/Output:   Intake/Output Summary (Last 24 hours) at 04/02/17 0619 Last data filed at 04/02/17 0345  Gross per 24 hour  Intake            549.6 ml  Output             1550 ml  Net          -1000.4 ml     Physical Exam: CVP 14 General:Ill appearing male, sitting up in recliner. NAD HEENT: Normal  Neck: Supple. 10 cm JVP  Carotids 2+ bilat; no bruits. No thyromegaly or nodule noted. Cor: PMI  nondisplaced. RRR, No M/G/R noted Lungs: Clear in upper lobes, rhonchi in bases. Normal effort.  Abdomen: Soft, non-tender, + distended, no HSM. No bruits or masses. Bowel sounds presents.  Extremities: No cyanosis, clubbing, rash, Unna boots in place, 3+ edema to knees.  Neuro: Alert & orientedx3, cranial nerves grossly intact. moves all 4 extremities w/o difficulty. Affect anxious    Telemetry: NSR     Labs: Basic Metabolic Panel:  Recent Labs Lab 03/29/17 0247 03/30/17 0402 03/30/17 1311 03/31/17 0314 04/01/17 0311 04/02/17 0304  NA 128* 128* 129* 129* 129* 129*  K 4.0 4.0 4.1 5.1 5.0 5.5*  CL 89* 91* 91* 91* 91* 91*  CO2 28 28 29 27 31 30   GLUCOSE 296* 252* 145* 158* 174* 172*  BUN 30* 32* 35* 38* 44* 50*  CREATININE 1.25* 1.30* 1.28* 1.35* 1.55* 1.58*  CALCIUM 8.7* 8.4* 8.6* 8.6* 8.7* 8.5*  MG 2.2 2.3  --  2.3 2.4 2.4      CBC:  Recent Labs Lab 03/29/17 0247 03/30/17 0402 03/31/17 0314 04/01/17 0311 04/02/17 0304  WBC 9.7 10.8* 10.8* 10.5 10.9*  HGB 10.9* 11.2* 11.5* 11.0* 11.4*  HCT 35.0* 36.6* 37.5* 35.9* 37.4*  MCV 92.6 92.0 92.1 92.1 92.8  PLT 190 218 244 200 234     BNP: BNP (  last 3 results)  Recent Labs  12/26/16 0716 12/29/16 0355  BNP 412.4* 236.2*      Imaging: No results found.   Transthoracic Echocardiography 03/23/17 Study Conclusions  - Left ventricle: Inferior and posterior lateral hypokinesis Wall   thickness was increased in a pattern of severe LVH. Systolic   function was moderately to severely reduced. The estimated   ejection fraction was in the range of 30% to 35%. The study is   not technically sufficient to allow evaluation of LV diastolic   function. - Mitral valve: There was mild to moderate regurgitation. - Left atrium: The atrium was severely dilated. - Right ventricle: The cavity size was moderately dilated. - Right atrium: The atrium was moderately dilated. - Atrial septum: No defect or patent foramen  ovale was identified. - Impressions: RV is dialted with septal flattening consisant wtih   cor pulmonale.  Impressions:  - RV is dialted with septal flattening consisant wtih cor   pulmonale.     Medications:     Scheduled Medications: . amiodarone  400 mg Oral BID  . aspirin EC  81 mg Oral Daily  . atorvastatin  40 mg Oral QHS  . furosemide  80 mg Intravenous BID  . insulin aspart  0-20 Units Subcutaneous TID WC  . insulin aspart  0-5 Units Subcutaneous QHS  . insulin glargine  10 Units Subcutaneous Daily  . losartan  12.5 mg Oral Daily  . sodium chloride flush  10-40 mL Intracatheter Q12H  . Warfarin - Pharmacist Dosing Inpatient   Does not apply q1800    Infusions:   PRN Medications: acetaminophen **OR** acetaminophen, ondansetron **OR** ondansetron (ZOFRAN) IV, sodium chloride flush   Assessment/Plan/Discussion:   Christian Garner is a 71 year old admitted from [facility] with new onset A fib and A/C systolic heart failure.   1. A Fib RVR: New onset this admission with unknown duration.  - Remains in NSR.  - Continue po Amio 400 mg BID x 7 days (day 5  of this dose) , then Amio 200 mg BID.  - This patients CHA2DS2-VASc Score and unadjusted Ischemic Stroke Rate (% per year) is equal to 4.8 % stroke rate/year from a score of 4 Above score calculated as 1 point each if present [CHF, HTN, DM, Vascular=MI/PAD/Aortic Plaque, Age if 65-74, or Male], 2 points each if present [Age > 75, or Stroke/TIA/TE] - Continue warfarin for anticoagulation. INR 3.15.  2. A/C Systolic Heart Failure- ECHO 12/2016 EF 30-35%. RV mildly dilated. ICM: LHC in June 2017 at [facility], had DES placed to LAD. Also with DES to RCA in 2014.  - NYHA III, remains in low output HF.  - Volume status remains elevated, continue IV lasix until discharge. Then start po Lasix 80mg  BID.  - Milrinone stopped yesterday after discussion with the patient and his family.   - K is 5.5. Arlyce Harman stopped yesterday but  has had persistent hyperkalemia for 3 days. Will stop losartan as well. He is hypotensive as well.  3. A/C Respiratory Failure - Remains on supplemental O2.  4. AKI on CKD: Creatinine March 2018 was 1.26. Creatinine peaked 2.06.  - Creatinine worsening in the setting of end stage HF.  5. Hyperkalemia  - K up to 5.5.  - Will stop losartan as above. Arlyce Harman stopped yesterday.    6. R pleural Effusion - S/P Thoracentesis. - Chest x ray with persistent R pleural effusion, but no need for repeat thoracentesis.  - Stable, no changes  to current plan. 7. CAD- Multivessel CAD  - s/p DES LAD 6/17, mod stenosis circumflex. - No signs or symptoms of ischemia  - No beta blocker with low output.  - Plavix stopped  - Continue ASA.  7. LE cellulitis  - Resolved, completed antibiotics.  8. Deconditioning - CR and PT  reccommending SNF.  - Patient and family wish for him to go to SNF, if he decompensates they would like for him to come home with Hospice.  9. Hyponatremia - Na 129. Restrict free water.  10. Goals of care - As above, pt was not a candidate for home milrinone. Remained in low output heart failure despite milrinone. Goals of care discussed. He will go to SNF, if his condition deteriorates further which I suspect it will, he will go home with Hospice.   Length of Stay: [place], NP  04/02/2017, 6:19 AM  Advanced Heart Failure Team Pager [phone] (M-F; 7a - 4p)  Please contact [facility] for night-coverage after hours (4p -7a ) and weekends on [email or web address]  Patient seen with NP, agree with the above note. No good options to reverse the current situation.  He is off milrinone now.  He remains in NSR. He will go to SNF, if there is further deterioration he will go home with hospice care.   Loralie Champagne 04/02/2017 12:21 PM

## 2017-04-02 NOTE — NC FL2 (Signed)
[place] LEVEL OF CARE SCREENING TOOL     IDENTIFICATION  Patient Name: Christian Garner Birthdate: [birth year] Sex: male Admission Date (Current Location): 03/19/2017  [facility] and [place] Number:  Herbalist and Address:  [facility] [name], [place] [address], [place], [place] [address]      Provider Number: [id number]  Attending Physician Name and Address:  Allie Bossier, MD  Relative Name and Phone Number:       Current Level of Care: Hospital Recommended Level of Care: [place] Prior Approval Number:    Date Approved/Denied:   PASRR Number: [id number]  Discharge Plan: SNF    Current Diagnoses: Patient Active Problem List   Diagnosis Date Noted  . Pleural effusion, right   . S/P thoracentesis   . Acute renal failure with acute tubular necrosis superimposed on stage 2 chronic kidney disease ([place])   . Cellulitis of left lower extremity   . Goals of care, counseling/discussion   . Palliative care encounter   . Hyponatremia   . Pressure injury of skin 03/20/2017  . Acute on chronic combined systolic and diastolic CHF (congestive heart failure) ([place]) 03/19/2017  . Acute on chronic respiratory failure with hypoxia and hypercapnia ([place]) 03/19/2017  . AF (paroxysmal atrial fibrillation) ([place]) 03/19/2017  . Community acquired pneumonia 03/19/2017  . Pleural effusion   . AKI (acute kidney injury) ([place])     Orientation RESPIRATION BLADDER Height & Weight     Self, Situation, Time, Place  O2 (2L) Continent Weight: 259 lb 4.2 oz (117.6 kg) Height:  6\' 2"  (188 cm)  BEHAVIORAL SYMPTOMS/MOOD NEUROLOGICAL BOWEL NUTRITION STATUS   (Exhibits some confusion at times)   Continent Diet (Heart healthy/carb modified; Thin fluid; Fluid restriction-1500 mL )  AMBULATORY STATUS COMMUNICATION OF NEEDS Skin   Limited Assist Verbally PU Stage and Appropriate Care PU Stage 1 Dressing:  (Bilateral buttocks; foam dressing changed  every 3 days)                     Personal Care Assistance Level of Assistance  Bathing, Feeding, Dressing Bathing Assistance: Limited assistance Feeding assistance: Independent Dressing Assistance: Limited assistance     Functional Limitations Info  Sight, Hearing, Speech Sight Info: Adequate Hearing Info: Adequate Speech Info: Adequate    SPECIAL CARE FACTORS FREQUENCY  PT (By licensed PT)     PT Frequency: 5x              Contractures Contractures Info: Not present    Additional Factors Info  Code Status, Allergies, Insulin Sliding Scale Code Status Info: DNR Allergies Info: No known allergies   Insulin Sliding Scale Info: See MAR       Current Medications (04/02/2017):  This is the current hospital active medication list Current Facility-Administered Medications  Medication Dose Route Frequency Provider Last Rate Last Dose  . acetaminophen (TYLENOL) tablet 650 mg  650 mg Oral Q6H PRN Edwin Dada, MD   650 mg at 04/02/17 0334   Or  . acetaminophen (TYLENOL) suppository 650 mg  650 mg Rectal Q6H PRN Edwin Dada, MD      . amiodarone (PACERONE) tablet 400 mg  400 mg Oral BID Jettie Booze E, NP   400 mg at 04/02/17 0855  . aspirin EC tablet 81 mg  81 mg Oral Daily Jettie Booze E, NP   81 mg at 04/02/17 0854  . atorvastatin (LIPITOR) tablet 40 mg  40 mg  Oral QHS Edwin Dada, MD   40 mg at 04/01/17 2137  . furosemide (LASIX) tablet 80 mg  80 mg Oral BID Allie Bossier, MD      . insulin aspart (novoLOG) injection 0-20 Units  0-20 Units Subcutaneous TID WC [name], MD   3 Units at 04/02/17 1250  . insulin aspart (novoLOG) injection 0-5 Units  0-5 Units Subcutaneous QHS [name], MD   2 Units at 03/25/17 2215  . insulin glargine (LANTUS) injection 10 Units  10 Units Subcutaneous Daily Allie Bossier, MD   10 Units at 04/02/17 [phone]  . ondansetron (ZOFRAN) tablet 4 mg  4 mg Oral Q6H PRN Danford, Suann Larry, MD        Or  . ondansetron (ZOFRAN) injection 4 mg  4 mg Intravenous Q6H PRN Edwin Dada, MD   4 mg at 03/31/17 1312  . sodium chloride flush (NS) 0.9 % injection 10-40 mL  10-40 mL Intracatheter Q12H [name], MD   10 mL at 04/02/17 0931  . sodium chloride flush (NS) 0.9 % injection 10-40 mL  10-40 mL Intracatheter PRN [name], MD      . warfarin (COUMADIN) tablet 5 mg  5 mg Oral ONCE-1800 Otilio Miu, [place]      . Warfarin - Pharmacist Dosing Inpatient   Does not apply q1800 Allie Bossier, MD         Discharge Medications: Please see discharge summary for a list of discharge medications.  Relevant Imaging Results:  Relevant Lab Results:   Additional Information SSN: [id number]  Truitt Merle, LCSW

## 2017-04-02 NOTE — Progress Notes (Signed)
ANTICOAGULATION CONSULT NOTE - Follow Up Consult  Pharmacy Consult for Coumadin Indication: atrial fibrillation  No Known Allergies  Patient Measurements: Height: 6\' 2"  (188 cm) Weight: 259 lb 4.2 oz (117.6 kg) IBW/kg (Calculated) : 82.2  Vital Signs: Temp: 97.4 F (36.3 C) (06/22 1208) Temp Source: Oral (06/22 1208) BP: 110/44 (06/22 1208) Pulse Rate: 71 (06/22 1208)  Labs:  Recent Labs  03/31/17 0314 04/01/17 0311 04/02/17 0304  HGB 11.5* 11.0* 11.4*  HCT 37.5* 35.9* 37.4*  PLT 244 200 234  LABPROT 23.2* 26.3* 27.5*  INR 2.02 2.36 2.50  CREATININE 1.35* 1.55* 1.58*    Estimated Creatinine Clearance: 58.5 mL/min (A) (by C-G formula based on SCr of 1.58 mg/dL (H)).  Assessment: 70yom continues on coumadin for new afib (DOAC copay too expensive). INR therapeutic at 2.5. Heparin bridge stopped 6/19. CBC stable. No bleeding. Continues on po amiodarone.   Goal of Therapy:  INR 2-3 Monitor platelets by anticoagulation protocol: Yes   Plan:  1) Coumadin 5mg  tonight 2) Daily INR  Deboraha Sprang 04/02/2017,12:25 PM

## 2017-04-02 NOTE — Clinical Social Work Placement (Addendum)
   CLINICAL SOCIAL WORK PLACEMENT  NOTE  Date:  04/02/2017  Patient Details  Name: Christian Garner MRN: [id number] Date of Birth: 1946-06-09  Clinical Social Work is seeking post-discharge placement for this patient at the [place] level of care (*CSW will initial, date and re-position this form in  chart as items are completed):  Yes   Patient/family provided with [facility] Work Department's list of facilities offering this level of care within the geographic area requested by the patient (or if unable, by the patient's family).  Yes   Patient/family informed of their freedom to choose among providers that offer the needed level of care, that participate in Medicare, Medicaid or managed care program needed by the patient, have an available bed and are willing to accept the patient.  Yes   Patient/family informed of [facility]'s ownership interest in [name] and [facility], as well as of the fact that they are under no obligation to receive care at these facilities.  PASRR submitted to EDS on 03/30/17     PASRR number received on 03/30/17     Existing PASRR number confirmed on       FL2 transmitted to all facilities in geographic area requested by pt/family on 03/30/17     FL2 transmitted to all facilities within larger geographic area on       Patient informed that his/her managed care company has contracts with or will negotiate with certain facilities, including the following:        Yes   Patient/family informed of bed offers received.  Patient chooses bed at [facility]     Physician recommends and patient chooses bed at      Patient to be transferred to [facility] on 04/02/17.  Patient to be transferred to facility by [facility]     Patient family notified on 04/02/17 of transfer.  Name of family member notified:  Geophysical data processor     PHYSICIAN Please prepare prescriptions      Additional Comment:  Pt is ready for discharge today and will go to [facility]. Pt and dtr-[name] are aware and agreeable to discharge plan. CSW sent clinicals to [facility] and communicated with Gwyndolyn Kaufman (admissions) to confirm bed offer. Room and report provided to RN and put in treatment team sticky note. Transportation arranged with [facility]-spoke with Liliane Channel at 2:33pm. DNR on chart. CSW is signing off as no further needs identified. _______________________________________________ Truitt Merle, LCSW 04/02/2017, 3:36 PM

## 2017-04-02 NOTE — Discharge Summary (Addendum)
Physician Discharge Summary  Christian Garner OZD:[id number] DOB: [birth year] DOA: 03/19/2017  PCP: [name], MD  Admit date: 03/19/2017 Discharge date: 04/02/2017  Time spent: 35 minutes  Recommendations for Outpatient Follow-up:  The following instructions are per cardiology recommendations. . A Fib RVR:  -New onset this admission with unknown duration.  - Remains in NSR.  - Continue po Amio 400 mg BID x 7 days (day 5  of this dose) , then Amio 200 mg BID.  - This patients CHA2DS2-VASc Score and unadjusted Ischemic Stroke Rate (% per year) is equal to 4.8 % stroke rate/year from a score of 4 Above score calculated as 1 point each if present [CHF, HTN, DM, Vascular=MI/PAD/Aortic Plaque, Age if 65-74, or Male], 2 points each if present [Age > 75, or Stroke/TIA/TE] - Continue warfarin for anticoagulation.  Recent Labs Lab 03/30/17 0218 03/30/17 1311 03/31/17 0314 04/01/17 0311 04/02/17 0304  INR 1.68 2.19 2.02 2.36 2.50   Acute on Chronic Systolic Heart Failure - ECHO 12/2016 EF 30-35%. RV mildly dilated. ICM: LHC in June 2017 at [name], had DES placed to LAD. Also with DES to RCA in 2014.  - NYHA III, remains in low output HF.  - Lasix 80mg  BID.  - Milrinone stopped yesterday after discussion with the patient and his family.   - K is 5.5. Arlyce Harman stopped yesterday but has had persistent hyperkalemia for 3 days. Will stop losartan as well. He is hypotensive as well.  -Patient has follow-up appointment with Cardiology on 7/2 at 1400 in the [facility] vascular [facility] specialty clinics.  CAD- Multivessel CAD  - s/p DES LAD 6/17, mod stenosis circumflex. - No signs or symptoms of ischemia  - No beta blocker with low output.  - Plavix stopped  - Continue ASA.   Acute on chronic hypoxic and hypercapnic respiratory failure -Appears to be primarily related to volume overload in the setting of a CHF exacerbation + pleural effusion  -Resolving. Now on 2 L O2  -SATURATION  QUALIFICATIONS: (Thisnote is usedto comply with regulatory documentation for home oxygen) Patient Saturations on Room Air at Rest = 86% Patient Saturations on [employer] while Ambulating = 83% Patient Saturations on 4Liters of oxygen while Ambulating = 90% Please briefly explain why patient needs home oxygen: -Home oxygen ordered  Acute renal failure on CKD stage II (Creatinine 1.29 at discharge 01/05/17)  -Creatinine peaked 2.06.  Lab Results  Component Value Date   CREATININE 1.58 (H) 04/02/2017   CREATININE 1.55 (H) 04/01/2017   CREATININE 1.35 (H) 03/31/2017     Hyperkalemia  - K up to 5.5.  - Cardiology discontinued Losartan and Spironolactone .    Hyponatremia - Na 129. Restrict free water.     Rt Pleural Effusion - S/P Thoracentesis. - Chest x ray with persistent R pleural effusion, but no need for repeat thoracentesis.  - Stable, no changes to current plan.  LE cellulitis  - Resolved, completed antibiotics.   Deconditioning - CR and PT  reccommending SNF.  - Patient and family wish for him to go to SNF, if he decompensates they would like for him to come home with Hospice.   Goals of care - As above, pt was not a candidate for home milrinone. Remained in low output heart failure despite milrinone. Goals of care discussed. He will go to SNF, if his condition deteriorates further which cardiology I suspects it will, he will go home with Hospice. Patient's stay was complicated by  pleural effusion S/P thoracentesis, acute on chronic kidney disease, with some improvement with diuresis.         Discharge Diagnoses:  Principal Problem:   Acute on chronic respiratory failure with hypoxia and hypercapnia (HCC) Active Problems:   AKI (acute kidney injury) (HCC)   Pleural effusion   Acute on chronic combined systolic and diastolic CHF (congestive heart failure) (HCC)   AF (paroxysmal atrial fibrillation) ([place])   Community acquired pneumonia   Pressure injury of  skin   Hyponatremia   Goals of care, counseling/discussion   Palliative care encounter   Pleural effusion, right   S/P thoracentesis   Acute renal failure with acute tubular necrosis superimposed on stage 2 chronic kidney disease (HCC)   Cellulitis of left lower extremity   Discharge Condition: Guarded  Diet recommendation: Heart healthy  Filed Weights   03/31/17 0459 04/01/17 0544 04/02/17 0623  Weight: 255 lb 14.4 oz (116.1 kg) 257 lb 4.4 oz (116.7 kg) 259 lb 4.2 oz (117.6 kg)    History of present illness:  71 y.o.WM PMHx Chronic Systolic and Diastolic CHF (EF 75%), home O2, CAD s/p PCI, CKD baseline Cr 1.3, and DMwho presented with 3-4 days of progressive dyspnea accompanied by leg swelling. In the ED at [facility] an EKG noted atrial fibrillation w/ a HR of 115. CXR noted a large R pleural effusion. He was transferred to [name]. Of note he had an admission to [facility] in March 2018 during which he required intubation as well as CRRT for acute renal failure w/ hyperkalemia and a large transudative pleural effusion During his hospitalization patient was treated by CHF team for end-stage CHF. Despite aggressive diuresis patient's condition only mildly improved. Overall patient's prognosis is poor, family and patient are aware.    Procedures: 6/12 Echocardiogram:Left ventricle: Inferior and posterior lateral hypokinesis Severe LVH.  -LVEF =30% to 35%. - Mitral valve:  mild to moderate regurgitation. - Left atrium: severely dilated.-- Right ventricle:  moderately dilated. - Right atrium:  moderately dilated. - RV is dialted with septal flattening consisant wtihcor pulmonale.   Consultations: Cardiology CHF team    Antibiotics Anti-infectives    Start     Stop   03/20/17 2200  levofloxacin (LEVAQUIN) IVPB 500 mg  Status:  Discontinued     03/20/17 1108   03/20/17 0030  levofloxacin (LEVAQUIN) IVPB 750 mg     03/20/17 0215        Discharge Exam: Vitals:    04/02/17 0400 04/02/17 0623 04/02/17 0751 04/02/17 1208  BP: (!) 91/58  (!) 94/59 (!) 110/44  Pulse: 73  73 71  Resp: 17  12 (!) 23  Temp:   97.4 F (36.3 C) 97.4 F (36.3 C)  TempSrc:   Oral Oral  SpO2: 98%  97% 98%  Weight:  259 lb 4.2 oz (117.6 kg)    Height:        General:A/O 4,, positive acute on chronic respiratory distress(Improvined, new baseline) Eyes: negative scleral hemorrhage, negative anisocoria, negative icterus ENT: Negative Runny nose, negative gingival bleeding, Neck:  Negative scars, masses, torticollis, lymphadenopathy, JVD Lungs: Clear to auscultation bilaterally without wheezes or crackles Cardiovascular: Regular rate and rhythm without murmur gallop or rub normal S1 and S2     Discharge Instructions   Allergies as of 04/02/2017   No Known Allergies     Medication List    STOP taking these medications   carvedilol 3.125 MG tablet Commonly known as:  COREG   clopidogrel 75  MG tablet Commonly known as:  PLAVIX   sitaGLIPtin 100 MG tablet Commonly known as:  JANUVIA   sulfamethoxazole-trimethoprim 800-160 MG tablet Commonly known as:  BACTRIM DS,SEPTRA DS     TAKE these medications   acetaminophen 325 MG tablet Commonly known as:  TYLENOL Take 975 mg by mouth at bedtime.   amiodarone 400 MG tablet Commonly known as:  PACERONE Take 1 tablet (400 mg total) by mouth 2 (two) times daily.   amiodarone 200 MG tablet Commonly known as:  PACERONE Take 1 tablet (200 mg total) by mouth 2 (two) times daily. Start taking on:  04/05/2017   aspirin EC 81 MG tablet Take 1 tablet (81 mg total) by mouth daily. What changed:  when to take this   atorvastatin 40 MG tablet Commonly known as:  LIPITOR Take 40 mg by mouth at bedtime.   bismuth subsalicylate 709 GG/83MO suspension Commonly known as:  PEPTO BISMOL Take 30 mLs by mouth every 6 (six) hours as needed for indigestion.   calcium carbonate 500 MG chewable tablet Commonly known as:  TUMS -  dosed in mg elemental calcium Chew 1 tablet by mouth daily as needed for indigestion or heartburn.   ferrous sulfate 325 (65 FE) MG tablet Take 325 mg by mouth daily with breakfast.   fluticasone 50 MCG/ACT nasal spray Commonly known as:  FLONASE Place 1 spray into both nostrils daily as needed for allergies or rhinitis.   furosemide 80 MG tablet Commonly known as:  LASIX Take 1 tablet (80 mg total) by mouth 2 (two) times daily. What changed:  medication strength  how much to take  when to take this   insulin aspart 100 UNIT/ML injection Commonly known as:  novoLOG Inject 0-20 Units into the skin 3 (three) times daily with meals.   insulin aspart 100 UNIT/ML injection Commonly known as:  novoLOG Inject 0-5 Units into the skin at bedtime.   insulin glargine 100 unit/mL Sopn Commonly known as:  LANTUS Inject 0.1 mLs (10 Units total) into the skin daily.   nitroGLYCERIN 0.4 MG SL tablet Commonly known as:  NITROSTAT Place 0.4 mg under the tongue every 5 (five) minutes as needed for chest pain.   ondansetron 4 MG tablet Commonly known as:  ZOFRAN Take 1 tablet (4 mg total) by mouth every 6 (six) hours as needed for nausea.   PEN NEEDLES 29GX1/2" 29G X 12MM Misc 10 Units by Does not apply route daily.   vitamin B-12 1000 MCG tablet Commonly known as:  CYANOCOBALAMIN Take 1,000 mcg by mouth daily.   warfarin 5 MG tablet Commonly known as:  COUMADIN Take 1 tablet (5 mg total) by mouth one time only at 6 PM.            Durable Medical Equipment        Start     Ordered   03/30/17 1834  For home use only DME oxygen  Once    Comments:  SATURATION QUALIFICATIONS: (This note is used to comply with regulatory documentation for home oxygen) Patient Saturations on Room Air at Rest = 86% Patient Saturations on Room Air while Ambulating = 83% Patient Saturations on 4 Liters of oxygen while Ambulating = 90% Please briefly explain why patient needs home oxygen:   -2  L O2 when sedentary increase to 4 L O2 with exertion. Titrate to ensure SPO2> 93% -Provide [employer] O2 concentrator  Question Answer Comment  Mode or (Route) Nasal cannula   Frequency Continuous (  stationary and portable oxygen unit needed)   Oxygen conserving device Yes   Oxygen delivery system Gas      03/30/17 1834     No Known Allergies  Contact information for follow-up providers    [place] Follow up on 04/12/2017.   Specialty:  Cardiology Why:  at 2:00 pm for hospital follow up. garage code [id number]. 1st floor of [facility] hospital. Garage under heart and vascular center, ok to enter through construction entrance.  Contact information: [address] [id number] [place] [phone]           Contact information for after-discharge care    Destination    [facility] Follow up.   Specialty:  Skilled Nursing Facility Contact information: [address] [place] [phone]                   The results of significant diagnostics from this hospitalization (including imaging, microbiology, ancillary and laboratory) are listed below for reference.    Significant Diagnostic Studies: Dg Chest 2 View  Result Date: 03/27/2017 CLINICAL DATA:  Pleural effusion. EXAM: CHEST  2 VIEW COMPARISON:  03/25/2017 FINDINGS: Heart is enlarged. There is a right pleural effusion, associated with basilar opacity. Diffuse bilateral interstitial edema. IMPRESSION: 1. Cardiomegaly and interstitial edema. 2. Persistent right pleural effusion and right basilar atelectasis or infiltrate. Electronically Signed   By: Nolon Nations M.D.   On: 03/27/2017 17:00   X-ray Chest Pa And Lateral  Result Date: 03/20/2017 CLINICAL DATA:  Status post right thoracentesis for a right pleural effusion. EXAM: CHEST  2 VIEW COMPARISON:  03/19/2017. FINDINGS: Significant decrease in  right pleural fluid. No pneumothorax seen. Decreased right mid and lower lung zone airspace opacity. No gross change in enlargement of the cardiac silhouette. The interstitial markings are mildly prominent. Calcified aortic arch. Thoracic spine degenerative changes. IMPRESSION: 1. Decreased right pleural fluid without pneumothorax following thoracentesis. 2. Improved atelectasis in the right mid and lower lung zones. 3. Stable cardiomegaly. 4. Mild chronic interstitial lung disease Electronically Signed   By: Claudie Revering M.D.   On: 03/20/2017 14:57   Dg Chest Port 1 View  Result Date: 03/25/2017 CLINICAL DATA:  Patient admitted 03/19/2017 with a history of chronic congestive heart failure and shortness of breath. Lower extremity swelling. EXAM: PORTABLE CHEST 1 VIEW COMPARISON:  Single-view of the chest 03/22/2017. PA and lateral chest 03/20/2017. FINDINGS: Right pleural effusion and basilar airspace disease have increased. Smaller left pleural effusion and basilar airspace disease appear mildly improved. No pneumothorax. There is cardiomegaly and vascular congestion. IMPRESSION: Improved scratch the increased right effusion and basilar airspace disease. Much smaller left effusion basilar airspace disease appear improved. Electronically Signed   By: Inge Rise M.D.   On: 03/25/2017 07:44   Dg Chest Port 1 View  Result Date: 03/22/2017 CLINICAL DATA:  post insertion to confirm placement EXAM: PORTABLE CHEST 1 VIEW COMPARISON:  Chest x-ray from earlier same day. FINDINGS: Left-sided PICC line is been placed with tip well positioned at the level of the lower SVC/cavoatrial junction. Stable cardiomegaly. Atherosclerotic changes again noted at the aortic arch. Vague opacities are again seen at each lung base, right greater the left, atelectasis versus layering pleural effusions. At least mild interstitial edema bilaterally. IMPRESSION: 1. Left-sided PICC line appears well positioned with tip at the level  of the lower SVC/cavoatrial junction. 2. No other change in the short-term interval.  Probable small bilateral pleural effusions, right greater than left. Stable central pulmonary vascular congestion and interstitial edema. Stable cardiomegaly. 3. Aortic atherosclerosis. Electronically Signed   By: Franki Cabot M.D.   On: 03/22/2017 18:43   Dg Chest Port 1 View  Result Date: 03/22/2017 CLINICAL DATA:  71 year old male with pleural effusion. Subsequent encounter. EXAM: PORTABLE CHEST 1 VIEW COMPARISON:  02/17/2017. FINDINGS: Increase in size of right-sided pleural effusion. Pulmonary vascular congestion asymmetric greater on the right. Cardiomegaly. Calcified aorta. No pneumothorax. IMPRESSION: Increase in size of right-sided pleural effusion. Pulmonary vascular prominence greater on the right. Cardiomegaly. Aortic atherosclerosis. Electronically Signed   By: Genia Del M.D.   On: 03/22/2017 07:07   US Thoracentesis Asp Pleural Space W/img Guide  Result Date: 03/20/2017 INDICATION: Patient with right pleural effusion. Request is made for diagnostic and therapeutic right thoracentesis. EXAM: ULTRASOUND GUIDED DIAGNOSTIC AND THERAPEUTIC RIGHT THORACENTESIS MEDICATIONS: 10 mL 1% lidocaine. COMPLICATIONS: None immediate. PROCEDURE: An ultrasound guided thoracentesis was thoroughly discussed with the patient and questions answered. The benefits, risks, alternatives and complications were also discussed. The patient understands and wishes to proceed with the procedure. Written consent was obtained. Ultrasound was performed to localize and mark an adequate pocket of fluid in the right chest. The area was then prepped and draped in the normal sterile fashion. 1% Lidocaine was used for local anesthesia. Under ultrasound guidance a Safe-T-Centesis catheter was introduced. Thoracentesis was performed. The catheter was removed and a dressing applied. FINDINGS: A total of approximately 1.4 liters of amber fluid was  removed. Samples were sent to the laboratory as requested by the clinical team. IMPRESSION: Successful ultrasound guided diagnostic and therapeutic right thoracentesis yielding 1.4 liters of pleural fluid. Read by:  Brynda Greathouse PA-C Electronically Signed   By: Marybelle Killings M.D.   On: 03/20/2017 14:38    Microbiology: No results found for this or any previous visit (from the past 240 hour(s)).   Labs: Basic Metabolic Panel:  Recent Labs Lab 03/29/17 0247 03/30/17 0402 03/30/17 1311 03/31/17 0314 04/01/17 0311 04/02/17 0304  NA 128* 128* 129* 129* 129* 129*  K 4.0 4.0 4.1 5.1 5.0 5.5*  CL 89* 91* 91* 91* 91* 91*  CO2 28 28 29 27 31 30   GLUCOSE 296* 252* 145* 158* 174* 172*  BUN 30* 32* 35* 38* 44* 50*  CREATININE 1.25* 1.30* 1.28* 1.35* 1.55* 1.58*  CALCIUM 8.7* 8.4* 8.6* 8.6* 8.7* 8.5*  MG 2.2 2.3  --  2.3 2.4 2.4   Liver Function Tests:  Recent Labs Lab 04/02/17 0304  AST 25  ALT 31  ALKPHOS 72  BILITOT 0.6  PROT 6.9  ALBUMIN 3.0*   No results for input(s): LIPASE, AMYLASE in the last 168 hours. No results for input(s): AMMONIA in the last 168 hours. CBC:  Recent Labs Lab 03/29/17 0247 03/30/17 0402 03/31/17 0314 04/01/17 0311 04/02/17 0304  WBC 9.7 10.8* 10.8* 10.5 10.9*  HGB 10.9* 11.2* 11.5* 11.0* 11.4*  HCT 35.0* 36.6* 37.5* 35.9* 37.4*  MCV 92.6 92.0 92.1 92.1 92.8  PLT 190 218 244 200 234   Cardiac Enzymes: No results for input(s): CKTOTAL, CKMB, CKMBINDEX, TROPONINI in the last 168 hours. BNP: BNP (last 3 results)  Recent Labs  12/26/16 0716 12/29/16 0355  BNP 412.4* 236.2*    ProBNP (last 3 results) No results for input(s): PROBNP in the last 8760 hours.  CBG:  Recent Labs Lab 04/01/17 1229 04/01/17 1646 04/01/17 2112 04/02/17 0750 04/02/17 1207  GLUCAP 202* 118*  179* 197* 130*       Signed:  Dia Crawford, MD [facility] [phone] pager

## 2017-04-02 NOTE — Progress Notes (Signed)
Called report to universal. Patton Salles MSN RN @ 15.00

## 2017-04-09 ENCOUNTER — Other Ambulatory Visit: Payer: Self-pay | Admitting: *Deleted

## 2017-04-09 ENCOUNTER — Telehealth (HOSPITAL_COMMUNITY): Payer: Self-pay | Admitting: Cardiology

## 2017-04-09 NOTE — Patient Outreach (Signed)
[facility]) Care Management  04/09/2017  Christian Garner 10-20-1945 [id number]    Met with [place].  She reports patient has supportive family she states patient may go home with Hospice.   Met with patient at bedside. He reports he is hoping to go home next Friday. He does not plan to have home care.  He reports he lives with his wife.   RNCM reviewed [facility] care management services with patient. He reports he will accept information but does not want to sign up at this time.  Plan to sign off as patient has information and agrees to call if he wants to participate. Royetta Crochet. Christian Purser, RN, BSN, [place] [phone]) Business Cell  [phone]) Toll Free Office

## 2017-04-09 NOTE — Telephone Encounter (Signed)
Unable to contact to complete HFU REMINDER CALL.    CHF Clinic appointment reminder call placed to patient for upcoming post-hospital follow up.  Does understand purpose of this appointment and where CHF Clinic is located?   How is patient feeling?   Does patient have all of their medications since their recent discharge?   Patient also reminded to take all medications as prescribed on the day of his/her appointment and to bring all medications to this appointment.  Advised to call our office for tardiness or cancellations/rescheduling needs.

## 2017-04-10 DIAGNOSIS — Z7901 Long term (current) use of anticoagulants: Secondary | ICD-10-CM | POA: Diagnosis not present

## 2017-04-12 ENCOUNTER — Ambulatory Visit (HOSPITAL_COMMUNITY)
Admit: 2017-04-12 | Discharge: 2017-04-12 | Disposition: A | Discharge status: Home / Self Care | Payer: No Typology Code available for payment source | Attending: Internal Medicine | Admitting: Internal Medicine

## 2017-04-12 ENCOUNTER — Encounter (HOSPITAL_COMMUNITY): Payer: Self-pay

## 2017-04-12 VITALS — BP 118/66 | HR 74

## 2017-04-12 DIAGNOSIS — I5084 End stage heart failure: Secondary | ICD-10-CM | POA: Diagnosis not present

## 2017-04-12 DIAGNOSIS — Z9981 Dependence on supplemental oxygen: Secondary | ICD-10-CM | POA: Diagnosis not present

## 2017-04-12 DIAGNOSIS — E1122 Type 2 diabetes mellitus with diabetic chronic kidney disease: Secondary | ICD-10-CM | POA: Insufficient documentation

## 2017-04-12 DIAGNOSIS — I130 Hypertensive heart and chronic kidney disease with heart failure and stage 1 through stage 4 chronic kidney disease, or unspecified chronic kidney disease: Secondary | ICD-10-CM | POA: Diagnosis not present

## 2017-04-12 DIAGNOSIS — N179 Acute kidney failure, unspecified: Secondary | ICD-10-CM | POA: Insufficient documentation

## 2017-04-12 DIAGNOSIS — I5042 Chronic combined systolic (congestive) and diastolic (congestive) heart failure: Secondary | ICD-10-CM

## 2017-04-12 DIAGNOSIS — I2510 Atherosclerotic heart disease of native coronary artery without angina pectoris: Secondary | ICD-10-CM | POA: Diagnosis not present

## 2017-04-12 DIAGNOSIS — J9610 Chronic respiratory failure, unspecified whether with hypoxia or hypercapnia: Secondary | ICD-10-CM | POA: Diagnosis not present

## 2017-04-12 DIAGNOSIS — I480 Paroxysmal atrial fibrillation: Secondary | ICD-10-CM | POA: Diagnosis not present

## 2017-04-12 DIAGNOSIS — N183 Chronic kidney disease, stage 3 (moderate): Secondary | ICD-10-CM | POA: Insufficient documentation

## 2017-04-12 DIAGNOSIS — I5022 Chronic systolic (congestive) heart failure: Secondary | ICD-10-CM | POA: Insufficient documentation

## 2017-04-12 DIAGNOSIS — Z9889 Other specified postprocedural states: Secondary | ICD-10-CM | POA: Insufficient documentation

## 2017-04-12 DIAGNOSIS — Z7982 Long term (current) use of aspirin: Secondary | ICD-10-CM | POA: Insufficient documentation

## 2017-04-12 DIAGNOSIS — Z7901 Long term (current) use of anticoagulants: Secondary | ICD-10-CM | POA: Insufficient documentation

## 2017-04-12 DIAGNOSIS — Z794 Long term (current) use of insulin: Secondary | ICD-10-CM | POA: Insufficient documentation

## 2017-04-12 DIAGNOSIS — Z87891 Personal history of nicotine dependence: Secondary | ICD-10-CM | POA: Insufficient documentation

## 2017-04-12 DIAGNOSIS — E875 Hyperkalemia: Secondary | ICD-10-CM | POA: Insufficient documentation

## 2017-04-12 DIAGNOSIS — Z955 Presence of coronary angioplasty implant and graft: Secondary | ICD-10-CM | POA: Insufficient documentation

## 2017-04-12 DIAGNOSIS — I4510 Unspecified right bundle-branch block: Secondary | ICD-10-CM | POA: Insufficient documentation

## 2017-04-12 LAB — BASIC METABOLIC PANEL
ANION GAP: 8 (ref 5–15)
BUN: 39 mg/dL — AB (ref 6–20)
CO2: 33 mmol/L — ABNORMAL HIGH (ref 22–32)
Calcium: 8.8 mg/dL — ABNORMAL LOW (ref 8.9–10.3)
Chloride: 96 mmol/L — ABNORMAL LOW (ref 101–111)
Creatinine, Ser: 1.54 mg/dL — ABNORMAL HIGH (ref 0.61–1.24)
GFR calc Af Amer: 51 mL/min — ABNORMAL LOW (ref >60)
GFR, EST NON AFRICAN AMERICAN: 44 mL/min — AB (ref >60)
GLUCOSE: 145 mg/dL — AB (ref 65–99)
POTASSIUM: 4.8 mmol/L (ref 3.5–5.1)
SODIUM: 137 mmol/L (ref 135–145)

## 2017-04-12 LAB — BRAIN NATRIURETIC PEPTIDE: B NATRIURETIC PEPTIDE 5: 1244.8 pg/mL — AB (ref 0.0–100.0)

## 2017-04-12 MED ORDER — METOLAZONE 5 MG PO TABS: 5.0000 mg | ORAL_TABLET | ORAL | 3 refills | Status: DC

## 2017-04-12 MED ORDER — TORSEMIDE 20 MG PO TABS: 80.0000 mg | ORAL_TABLET | Freq: Every day | ORAL | 3 refills | Status: DC

## 2017-04-12 NOTE — Patient Instructions (Addendum)
Labs today (will call for abnormal results, otherwise no news is good news)  Take Metolazone 5mg  (1 Tablet) Twice Daily for 4 days ONLY, then start taking 1 Tablet every Monday and Thursday.   STOP Lasix  START Torsemide 80 mg (4 Tablets) Once Daily  Follow up in 1 Month

## 2017-04-12 NOTE — Progress Notes (Signed)
Advanced Heart Failure Clinic Note   Referring Physician: Primary Care: Primary Cardiologist:  HPI: [name] is a 71 year old with a history of chronic systolic heart failure(EF 30-35%), chronic respiratory failure on home O2, CAD s/p DES to LAD in 03/2016 , CKD, and DM. Previously followed by [name] cardiology but stopped following.   In March 2018 he required intubation and CRRT for acute renal failure with hyperkalemia.   Admitted 03/19/17-04/02/17 with acute on chronic CHF in the setting of atrial fibrillation with RVR. He was started on IV amiodarone and milrinone. Converted to NSR with amiodarone which was transitioned to po. Milrinone was weaned off but he developed recurrent cardiogenic shock, milrinone was restarted. His co ox remained low (40-46%) despite milrinone, and he is not a candidate for home milrinone. Palliative care saw him, he and his family wished for him to go to a rehab facility but if he decompensated further, then go home with Hospice. He was discharged to a SNF in [facility]. Discharge weight 259 pounds.   He presents today for post hospital follow up. He arrived via [facility] from the SNF. [facility] had seen him earlier in the morning for a routine visit and his REDs vest reading was 51%. This was not repeated in our office, as he could not stand and it was clear that he was markedly volume overloaded. He is dyspneic with talking, but denies SOB. Works with PT at the SNF and says that he does not feel SOB, but not able to walk very much at all before he has to stop and rest. He denies chest pain, palpitations.   Past Medical History:  Diagnosis Date  . CAD (coronary artery disease)   . Cellulitis and abscess of lower extremity   . CHF (congestive heart failure) (HCC)    EF 30%  . DM (diabetes mellitus) ([place])     Current Outpatient Prescriptions  Medication Sig Dispense Refill  . acetaminophen (TYLENOL) 325 MG tablet Take 975 mg by mouth at bedtime.     Marland Kitchen amiodarone  (PACERONE) 200 MG tablet Take 1 tablet (200 mg total) by mouth 2 (two) times daily. 60 tablet 0  . amiodarone (PACERONE) 400 MG tablet Take 1 tablet (400 mg total) by mouth 2 (two) times daily. 4 tablet 0  . aspirin EC 81 MG tablet Take 1 tablet (81 mg total) by mouth daily. 1 tablet 0  . atorvastatin (LIPITOR) 40 MG tablet Take 40 mg by mouth at bedtime.     . bismuth subsalicylate (PEPTO BISMOL) 262 MG/15ML suspension Take 30 mLs by mouth every 6 (six) hours as needed for indigestion.    . calcium carbonate (TUMS - DOSED IN MG ELEMENTAL CALCIUM) 500 MG chewable tablet Chew 1 tablet by mouth daily as needed for indigestion or heartburn.    . ferrous sulfate 325 (65 FE) MG tablet Take 325 mg by mouth daily with breakfast.    . fluticasone (FLONASE) 50 MCG/ACT nasal spray Place 1 spray into both nostrils daily as needed for allergies or rhinitis.    . furosemide (LASIX) 80 MG tablet Take 1 tablet (80 mg total) by mouth 2 (two) times daily. 60 tablet 0  . insulin aspart (NOVOLOG) 100 UNIT/ML injection Inject 0-20 Units into the skin 3 (three) times daily with meals. 10 mL 0  . insulin aspart (NOVOLOG) 100 UNIT/ML injection Inject 0-5 Units into the skin at bedtime. 10 mL 0  . insulin glargine (LANTUS) 100 unit/mL SOPN Inject 0.1 mLs (  10 Units total) into the skin daily. 15 mL 0  . Insulin Pen Needle (PEN NEEDLES 29GX1/2") 29G X 12MM MISC 10 Units by Does not apply route daily. 100 each 0  . nitroGLYCERIN (NITROSTAT) 0.4 MG SL tablet Place 0.4 mg under the tongue every 5 (five) minutes as needed for chest pain.    Marland Kitchen ondansetron (ZOFRAN) 4 MG tablet Take 1 tablet (4 mg total) by mouth every 6 (six) hours as needed for nausea. 20 tablet 0  . vitamin B-12 (CYANOCOBALAMIN) 1000 MCG tablet Take 1,000 mcg by mouth daily.    Marland Kitchen warfarin (COUMADIN) 5 MG tablet Take 1 tablet (5 mg total) by mouth one time only at 6 PM. 30 tablet 0   No current facility-administered medications for this encounter.     No  Known Allergies    Social History   Social History  . Marital status: Married    Spouse name: N/A  . Number of children: N/A  . Years of education: N/A   Occupational History  . Not on file.   Social History Main Topics  . Smoking status: Former Research scientist (life sciences)  . Smokeless tobacco: Never Used  . Alcohol use Not on file  . Drug use: Unknown  . Sexual activity: Not on file   Other Topics Concern  . Not on file   Social History Narrative  . No narrative on file      Family History  Problem Relation Age of Onset  . Hypertension Mother   . Diabetes Mother   . Bone cancer Mother   . Hypertension Father   . Bladder Cancer Sister     Vitals:   04/12/17 1358  BP: 118/66  Pulse: 74  SpO2: 98%     PHYSICAL EXAM: General:  Chronically ill appearing male, arrived on stretcher.  HEENT: Normal.  Neck: supple. JVP to ear. Carotids 2+ bilat; no bruits. No lymphadenopathy or thyromegaly appreciated. Cor: PMI nondisplaced. Regular rate & rhythm. No rubs, gallops or murmurs. Lungs: clear in upper lobes, bilateral crackles in bases.  Abdomen: soft, nontender, nondistended. No hepatosplenomegaly. No bruits or masses. Good bowel sounds. Extremities: no cyanosis, clubbing, rash, 3+ edema to thighs.  Neuro: alert & oriented x 3, cranial nerves grossly intact. moves all 4 extremities w/o difficulty. Affect pleasant.   ASSESSMENT & PLAN:  1. Chronic systolic CHF: ICM, LHC in June 2017 at [facility] with DES to LAD. Also with DES to RCA in 2014 Echo 12/2016 30-35%.  - NYHA III - Volume overloaded on exam - Change po lasix to torsemide 80 mg daily.  - Start 5mg  metolazone BID for 4 days. Then one tablet every Monday and Thursday.  - No ACE/ARB/ARNI with CKD. He had hyperkalemia with losartan.  - Dr. Haroldine Laws had a long talk with he and his wife today, explained that his heart failure is end stage, we are adding high dose diuretics as a last ditch effort. There is a chance that this may not  work.   2. PAF - Remains in NSR.  - Continue Amiodarone 200 mg BID.  -  This patients CHA2DS2-VASc Score and unadjusted Ischemic Stroke Rate (% per year) is equal to 4.8 % stroke rate/year from a score of 4 Above score calculated as 1 point each if present [CHF, HTN, DM, Vascular=MI/PAD/Aortic Plaque, Age if 65-74, or Male], 2 points each if present [Age > 75, or Stroke/TIA/TE] - Continue warfarin for anticoagulation  3. Chronic respiratory failure - Continues on supplemental O2.  4. CKD stage III - Creatinine worsening in the setting of end stage CHF      5. History of R pleural Effusion - S/P Thoracentesis in 03/2017 - Stable by last Echo   6. CAD- Multivessel CAD  - s/p DES LAD 6/17, mod stenosis circumflex. - Continue ASA - No signs or symptoms of ischemia  7. Deconditioning - Continues rehab at [facility].     Arbutus Leas, NP 04/12/17   Patient seen and examined with Jettie Booze, NP. We discussed all aspects of the encounter. I agree with the assessment and plan as stated above.   He has end-stage systolic HF. Volume status now going back up with significant fluid overload on exam in face of probable recurrent low output. Failed milrinone in the hospital during recent admit (worked initially then failed). Not candidate for advanced therapies due to age and comorbidities. Maintaining NSR on amio. Will do our best to diurese him aggressively as renal function tolerates. If fails, will need Hospice. Long talk with him and his wife about it. Unfortunately, he seems to have limited ability to understand the issues thoroughly.  Total time spent 45 minutes. Over half that time spent discussing above.   Glori Bickers, MD  8:59 PM

## 2017-04-19 DIAGNOSIS — E1169 Type 2 diabetes mellitus with other specified complication: Secondary | ICD-10-CM | POA: Diagnosis not present

## 2017-04-19 DIAGNOSIS — I5042 Chronic combined systolic (congestive) and diastolic (congestive) heart failure: Secondary | ICD-10-CM | POA: Diagnosis not present

## 2017-04-19 DIAGNOSIS — E785 Hyperlipidemia, unspecified: Secondary | ICD-10-CM | POA: Diagnosis not present

## 2017-04-19 DIAGNOSIS — E1129 Type 2 diabetes mellitus with other diabetic kidney complication: Secondary | ICD-10-CM | POA: Diagnosis not present

## 2017-04-21 ENCOUNTER — Telehealth (HOSPITAL_COMMUNITY): Payer: Self-pay | Admitting: Vascular Surgery

## 2017-04-21 NOTE — Telephone Encounter (Signed)
Pt wife called to cancel pt appt 05/13/17, she does nto want to resch at all

## 2017-04-22 DIAGNOSIS — Z7901 Long term (current) use of anticoagulants: Secondary | ICD-10-CM | POA: Diagnosis not present

## 2017-04-22 DIAGNOSIS — Z87448 Personal history of other diseases of urinary system: Secondary | ICD-10-CM | POA: Diagnosis not present

## 2017-04-22 DIAGNOSIS — I509 Heart failure, unspecified: Secondary | ICD-10-CM | POA: Diagnosis not present

## 2017-04-22 DIAGNOSIS — I429 Cardiomyopathy, unspecified: Secondary | ICD-10-CM | POA: Diagnosis not present

## 2017-04-22 DIAGNOSIS — Z5181 Encounter for therapeutic drug level monitoring: Secondary | ICD-10-CM | POA: Diagnosis not present

## 2017-04-22 DIAGNOSIS — I517 Cardiomegaly: Secondary | ICD-10-CM | POA: Diagnosis not present

## 2017-04-23 DIAGNOSIS — E1122 Type 2 diabetes mellitus with diabetic chronic kidney disease: Secondary | ICD-10-CM | POA: Diagnosis not present

## 2017-04-23 DIAGNOSIS — I5022 Chronic systolic (congestive) heart failure: Secondary | ICD-10-CM | POA: Diagnosis not present

## 2017-04-23 DIAGNOSIS — I4891 Unspecified atrial fibrillation: Secondary | ICD-10-CM | POA: Diagnosis not present

## 2017-04-23 DIAGNOSIS — I5033 Acute on chronic diastolic (congestive) heart failure: Secondary | ICD-10-CM | POA: Diagnosis not present

## 2017-04-23 DIAGNOSIS — N182 Chronic kidney disease, stage 2 (mild): Secondary | ICD-10-CM | POA: Diagnosis not present

## 2017-04-23 DIAGNOSIS — I5084 End stage heart failure: Secondary | ICD-10-CM | POA: Diagnosis not present

## 2017-04-27 DIAGNOSIS — N182 Chronic kidney disease, stage 2 (mild): Secondary | ICD-10-CM | POA: Diagnosis not present

## 2017-04-27 DIAGNOSIS — I5022 Chronic systolic (congestive) heart failure: Secondary | ICD-10-CM | POA: Diagnosis not present

## 2017-04-27 DIAGNOSIS — I5084 End stage heart failure: Secondary | ICD-10-CM | POA: Diagnosis not present

## 2017-04-27 DIAGNOSIS — E1122 Type 2 diabetes mellitus with diabetic chronic kidney disease: Secondary | ICD-10-CM | POA: Diagnosis not present

## 2017-04-27 DIAGNOSIS — I4891 Unspecified atrial fibrillation: Secondary | ICD-10-CM | POA: Diagnosis not present

## 2017-04-27 DIAGNOSIS — I5033 Acute on chronic diastolic (congestive) heart failure: Secondary | ICD-10-CM | POA: Diagnosis not present

## 2017-04-28 DIAGNOSIS — E1122 Type 2 diabetes mellitus with diabetic chronic kidney disease: Secondary | ICD-10-CM | POA: Diagnosis not present

## 2017-04-28 DIAGNOSIS — I5033 Acute on chronic diastolic (congestive) heart failure: Secondary | ICD-10-CM | POA: Diagnosis not present

## 2017-04-28 DIAGNOSIS — R600 Localized edema: Secondary | ICD-10-CM | POA: Diagnosis not present

## 2017-04-28 DIAGNOSIS — I4891 Unspecified atrial fibrillation: Secondary | ICD-10-CM | POA: Diagnosis not present

## 2017-04-28 DIAGNOSIS — R0609 Other forms of dyspnea: Secondary | ICD-10-CM | POA: Diagnosis not present

## 2017-04-28 DIAGNOSIS — R5383 Other fatigue: Secondary | ICD-10-CM | POA: Diagnosis not present

## 2017-04-28 DIAGNOSIS — N182 Chronic kidney disease, stage 2 (mild): Secondary | ICD-10-CM | POA: Diagnosis not present

## 2017-04-28 DIAGNOSIS — I5084 End stage heart failure: Secondary | ICD-10-CM | POA: Diagnosis not present

## 2017-04-28 DIAGNOSIS — I509 Heart failure, unspecified: Secondary | ICD-10-CM | POA: Diagnosis not present

## 2017-04-28 DIAGNOSIS — I5022 Chronic systolic (congestive) heart failure: Secondary | ICD-10-CM | POA: Diagnosis not present

## 2017-04-30 DIAGNOSIS — I5022 Chronic systolic (congestive) heart failure: Secondary | ICD-10-CM | POA: Diagnosis not present

## 2017-04-30 DIAGNOSIS — E1122 Type 2 diabetes mellitus with diabetic chronic kidney disease: Secondary | ICD-10-CM | POA: Diagnosis not present

## 2017-04-30 DIAGNOSIS — I5033 Acute on chronic diastolic (congestive) heart failure: Secondary | ICD-10-CM | POA: Diagnosis not present

## 2017-04-30 DIAGNOSIS — N182 Chronic kidney disease, stage 2 (mild): Secondary | ICD-10-CM | POA: Diagnosis not present

## 2017-04-30 DIAGNOSIS — I5084 End stage heart failure: Secondary | ICD-10-CM | POA: Diagnosis not present

## 2017-04-30 DIAGNOSIS — I4891 Unspecified atrial fibrillation: Secondary | ICD-10-CM | POA: Diagnosis not present

## 2017-05-01 DIAGNOSIS — N182 Chronic kidney disease, stage 2 (mild): Secondary | ICD-10-CM | POA: Diagnosis not present

## 2017-05-01 DIAGNOSIS — I5022 Chronic systolic (congestive) heart failure: Secondary | ICD-10-CM | POA: Diagnosis not present

## 2017-05-01 DIAGNOSIS — I4891 Unspecified atrial fibrillation: Secondary | ICD-10-CM | POA: Diagnosis not present

## 2017-05-01 DIAGNOSIS — E1122 Type 2 diabetes mellitus with diabetic chronic kidney disease: Secondary | ICD-10-CM | POA: Diagnosis not present

## 2017-05-01 DIAGNOSIS — I5033 Acute on chronic diastolic (congestive) heart failure: Secondary | ICD-10-CM | POA: Diagnosis not present

## 2017-05-01 DIAGNOSIS — I5084 End stage heart failure: Secondary | ICD-10-CM | POA: Diagnosis not present

## 2017-05-04 DIAGNOSIS — E1122 Type 2 diabetes mellitus with diabetic chronic kidney disease: Secondary | ICD-10-CM | POA: Diagnosis not present

## 2017-05-04 DIAGNOSIS — I5084 End stage heart failure: Secondary | ICD-10-CM | POA: Diagnosis not present

## 2017-05-04 DIAGNOSIS — N182 Chronic kidney disease, stage 2 (mild): Secondary | ICD-10-CM | POA: Diagnosis not present

## 2017-05-04 DIAGNOSIS — I4891 Unspecified atrial fibrillation: Secondary | ICD-10-CM | POA: Diagnosis not present

## 2017-05-04 DIAGNOSIS — I5022 Chronic systolic (congestive) heart failure: Secondary | ICD-10-CM | POA: Diagnosis not present

## 2017-05-04 DIAGNOSIS — I5033 Acute on chronic diastolic (congestive) heart failure: Secondary | ICD-10-CM | POA: Diagnosis not present

## 2017-05-06 DIAGNOSIS — I5022 Chronic systolic (congestive) heart failure: Secondary | ICD-10-CM | POA: Diagnosis not present

## 2017-05-06 DIAGNOSIS — I4891 Unspecified atrial fibrillation: Secondary | ICD-10-CM | POA: Diagnosis not present

## 2017-05-06 DIAGNOSIS — N182 Chronic kidney disease, stage 2 (mild): Secondary | ICD-10-CM | POA: Diagnosis not present

## 2017-05-06 DIAGNOSIS — I5084 End stage heart failure: Secondary | ICD-10-CM | POA: Diagnosis not present

## 2017-05-06 DIAGNOSIS — I5033 Acute on chronic diastolic (congestive) heart failure: Secondary | ICD-10-CM | POA: Diagnosis not present

## 2017-05-06 DIAGNOSIS — E1122 Type 2 diabetes mellitus with diabetic chronic kidney disease: Secondary | ICD-10-CM | POA: Diagnosis not present

## 2017-05-07 DIAGNOSIS — I5022 Chronic systolic (congestive) heart failure: Secondary | ICD-10-CM | POA: Diagnosis not present

## 2017-05-07 DIAGNOSIS — E1122 Type 2 diabetes mellitus with diabetic chronic kidney disease: Secondary | ICD-10-CM | POA: Diagnosis not present

## 2017-05-07 DIAGNOSIS — N182 Chronic kidney disease, stage 2 (mild): Secondary | ICD-10-CM | POA: Diagnosis not present

## 2017-05-07 DIAGNOSIS — I4891 Unspecified atrial fibrillation: Secondary | ICD-10-CM | POA: Diagnosis not present

## 2017-05-07 DIAGNOSIS — I5033 Acute on chronic diastolic (congestive) heart failure: Secondary | ICD-10-CM | POA: Diagnosis not present

## 2017-05-07 DIAGNOSIS — I5084 End stage heart failure: Secondary | ICD-10-CM | POA: Diagnosis not present

## 2017-05-10 DIAGNOSIS — I255 Ischemic cardiomyopathy: Secondary | ICD-10-CM | POA: Diagnosis present

## 2017-05-10 DIAGNOSIS — Y33XXXA Other specified events, undetermined intent, initial encounter: Secondary | ICD-10-CM | POA: Diagnosis not present

## 2017-05-10 DIAGNOSIS — I4891 Unspecified atrial fibrillation: Secondary | ICD-10-CM | POA: Diagnosis not present

## 2017-05-10 DIAGNOSIS — E1122 Type 2 diabetes mellitus with diabetic chronic kidney disease: Secondary | ICD-10-CM | POA: Diagnosis present

## 2017-05-10 DIAGNOSIS — Z6831 Body mass index (BMI) 31.0-31.9, adult: Secondary | ICD-10-CM | POA: Diagnosis not present

## 2017-05-10 DIAGNOSIS — Z9981 Dependence on supplemental oxygen: Secondary | ICD-10-CM | POA: Diagnosis not present

## 2017-05-10 DIAGNOSIS — R809 Proteinuria, unspecified: Secondary | ICD-10-CM | POA: Diagnosis not present

## 2017-05-10 DIAGNOSIS — J811 Chronic pulmonary edema: Secondary | ICD-10-CM | POA: Diagnosis not present

## 2017-05-10 DIAGNOSIS — Z87891 Personal history of nicotine dependence: Secondary | ICD-10-CM | POA: Diagnosis not present

## 2017-05-10 DIAGNOSIS — I4892 Unspecified atrial flutter: Secondary | ICD-10-CM | POA: Diagnosis present

## 2017-05-10 DIAGNOSIS — S90812A Abrasion, left foot, initial encounter: Secondary | ICD-10-CM | POA: Diagnosis present

## 2017-05-10 DIAGNOSIS — I5023 Acute on chronic systolic (congestive) heart failure: Secondary | ICD-10-CM | POA: Diagnosis not present

## 2017-05-10 DIAGNOSIS — I482 Chronic atrial fibrillation: Secondary | ICD-10-CM | POA: Diagnosis not present

## 2017-05-10 DIAGNOSIS — E119 Type 2 diabetes mellitus without complications: Secondary | ICD-10-CM | POA: Diagnosis not present

## 2017-05-10 DIAGNOSIS — I5043 Acute on chronic combined systolic (congestive) and diastolic (congestive) heart failure: Secondary | ICD-10-CM | POA: Diagnosis present

## 2017-05-10 DIAGNOSIS — E669 Obesity, unspecified: Secondary | ICD-10-CM | POA: Diagnosis present

## 2017-05-10 DIAGNOSIS — I2510 Atherosclerotic heart disease of native coronary artery without angina pectoris: Secondary | ICD-10-CM | POA: Diagnosis present

## 2017-05-10 DIAGNOSIS — Z7901 Long term (current) use of anticoagulants: Secondary | ICD-10-CM | POA: Diagnosis not present

## 2017-05-10 DIAGNOSIS — Z9861 Coronary angioplasty status: Secondary | ICD-10-CM | POA: Diagnosis not present

## 2017-05-10 DIAGNOSIS — J90 Pleural effusion, not elsewhere classified: Secondary | ICD-10-CM | POA: Diagnosis not present

## 2017-05-10 DIAGNOSIS — I480 Paroxysmal atrial fibrillation: Secondary | ICD-10-CM | POA: Diagnosis present

## 2017-05-10 DIAGNOSIS — J9611 Chronic respiratory failure with hypoxia: Secondary | ICD-10-CM | POA: Diagnosis present

## 2017-05-10 DIAGNOSIS — R0602 Shortness of breath: Secondary | ICD-10-CM | POA: Diagnosis not present

## 2017-05-10 DIAGNOSIS — N183 Chronic kidney disease, stage 3 (moderate): Secondary | ICD-10-CM | POA: Diagnosis present

## 2017-05-10 DIAGNOSIS — T148XXA Other injury of unspecified body region, initial encounter: Secondary | ICD-10-CM | POA: Diagnosis not present

## 2017-05-10 DIAGNOSIS — I517 Cardiomegaly: Secondary | ICD-10-CM | POA: Diagnosis not present

## 2017-05-10 DIAGNOSIS — S91302A Unspecified open wound, left foot, initial encounter: Secondary | ICD-10-CM | POA: Diagnosis not present

## 2017-05-13 ENCOUNTER — Encounter (HOSPITAL_COMMUNITY): Payer: Medicare Other

## 2017-05-15 DIAGNOSIS — N182 Chronic kidney disease, stage 2 (mild): Secondary | ICD-10-CM | POA: Diagnosis not present

## 2017-05-15 DIAGNOSIS — I4891 Unspecified atrial fibrillation: Secondary | ICD-10-CM | POA: Diagnosis not present

## 2017-05-15 DIAGNOSIS — E1122 Type 2 diabetes mellitus with diabetic chronic kidney disease: Secondary | ICD-10-CM | POA: Diagnosis not present

## 2017-05-15 DIAGNOSIS — I5084 End stage heart failure: Secondary | ICD-10-CM | POA: Diagnosis not present

## 2017-05-15 DIAGNOSIS — I5033 Acute on chronic diastolic (congestive) heart failure: Secondary | ICD-10-CM | POA: Diagnosis not present

## 2017-05-15 DIAGNOSIS — I5022 Chronic systolic (congestive) heart failure: Secondary | ICD-10-CM | POA: Diagnosis not present

## 2017-05-17 DIAGNOSIS — I5084 End stage heart failure: Secondary | ICD-10-CM | POA: Diagnosis not present

## 2017-05-17 DIAGNOSIS — E1122 Type 2 diabetes mellitus with diabetic chronic kidney disease: Secondary | ICD-10-CM | POA: Diagnosis not present

## 2017-05-17 DIAGNOSIS — I255 Ischemic cardiomyopathy: Secondary | ICD-10-CM | POA: Diagnosis not present

## 2017-05-17 DIAGNOSIS — N182 Chronic kidney disease, stage 2 (mild): Secondary | ICD-10-CM | POA: Diagnosis not present

## 2017-05-17 DIAGNOSIS — I5022 Chronic systolic (congestive) heart failure: Secondary | ICD-10-CM | POA: Diagnosis not present

## 2017-05-17 DIAGNOSIS — I5033 Acute on chronic diastolic (congestive) heart failure: Secondary | ICD-10-CM | POA: Diagnosis not present

## 2017-05-17 DIAGNOSIS — I4891 Unspecified atrial fibrillation: Secondary | ICD-10-CM | POA: Diagnosis not present

## 2017-05-20 DIAGNOSIS — I5033 Acute on chronic diastolic (congestive) heart failure: Secondary | ICD-10-CM | POA: Diagnosis not present

## 2017-05-20 DIAGNOSIS — I4891 Unspecified atrial fibrillation: Secondary | ICD-10-CM | POA: Diagnosis not present

## 2017-05-20 DIAGNOSIS — N182 Chronic kidney disease, stage 2 (mild): Secondary | ICD-10-CM | POA: Diagnosis not present

## 2017-05-20 DIAGNOSIS — I5084 End stage heart failure: Secondary | ICD-10-CM | POA: Diagnosis not present

## 2017-05-20 DIAGNOSIS — I5022 Chronic systolic (congestive) heart failure: Secondary | ICD-10-CM | POA: Diagnosis not present

## 2017-05-20 DIAGNOSIS — E1122 Type 2 diabetes mellitus with diabetic chronic kidney disease: Secondary | ICD-10-CM | POA: Diagnosis not present

## 2017-05-21 DIAGNOSIS — I5022 Chronic systolic (congestive) heart failure: Secondary | ICD-10-CM | POA: Diagnosis not present

## 2017-05-21 DIAGNOSIS — I4891 Unspecified atrial fibrillation: Secondary | ICD-10-CM | POA: Diagnosis not present

## 2017-05-21 DIAGNOSIS — N182 Chronic kidney disease, stage 2 (mild): Secondary | ICD-10-CM | POA: Diagnosis not present

## 2017-05-21 DIAGNOSIS — I5033 Acute on chronic diastolic (congestive) heart failure: Secondary | ICD-10-CM | POA: Diagnosis not present

## 2017-05-21 DIAGNOSIS — I5084 End stage heart failure: Secondary | ICD-10-CM | POA: Diagnosis not present

## 2017-05-21 DIAGNOSIS — E1122 Type 2 diabetes mellitus with diabetic chronic kidney disease: Secondary | ICD-10-CM | POA: Diagnosis not present

## 2017-05-25 DIAGNOSIS — Z79899 Other long term (current) drug therapy: Secondary | ICD-10-CM | POA: Diagnosis not present

## 2017-05-25 DIAGNOSIS — I5033 Acute on chronic diastolic (congestive) heart failure: Secondary | ICD-10-CM | POA: Diagnosis not present

## 2017-05-25 DIAGNOSIS — I255 Ischemic cardiomyopathy: Secondary | ICD-10-CM | POA: Diagnosis not present

## 2017-05-25 DIAGNOSIS — E1122 Type 2 diabetes mellitus with diabetic chronic kidney disease: Secondary | ICD-10-CM | POA: Diagnosis not present

## 2017-05-25 DIAGNOSIS — I5084 End stage heart failure: Secondary | ICD-10-CM | POA: Diagnosis not present

## 2017-05-25 DIAGNOSIS — I5022 Chronic systolic (congestive) heart failure: Secondary | ICD-10-CM | POA: Diagnosis not present

## 2017-05-25 DIAGNOSIS — N182 Chronic kidney disease, stage 2 (mild): Secondary | ICD-10-CM | POA: Diagnosis not present

## 2017-05-25 DIAGNOSIS — Z6833 Body mass index (BMI) 33.0-33.9, adult: Secondary | ICD-10-CM | POA: Diagnosis not present

## 2017-05-25 DIAGNOSIS — I4891 Unspecified atrial fibrillation: Secondary | ICD-10-CM | POA: Diagnosis not present

## 2017-05-25 DIAGNOSIS — Z87448 Personal history of other diseases of urinary system: Secondary | ICD-10-CM | POA: Diagnosis not present

## 2017-05-25 DIAGNOSIS — Z7901 Long term (current) use of anticoagulants: Secondary | ICD-10-CM | POA: Diagnosis not present

## 2017-05-26 DIAGNOSIS — I5033 Acute on chronic diastolic (congestive) heart failure: Secondary | ICD-10-CM | POA: Diagnosis not present

## 2017-05-26 DIAGNOSIS — I5022 Chronic systolic (congestive) heart failure: Secondary | ICD-10-CM | POA: Diagnosis not present

## 2017-05-26 DIAGNOSIS — E1122 Type 2 diabetes mellitus with diabetic chronic kidney disease: Secondary | ICD-10-CM | POA: Diagnosis not present

## 2017-05-26 DIAGNOSIS — N182 Chronic kidney disease, stage 2 (mild): Secondary | ICD-10-CM | POA: Diagnosis not present

## 2017-05-26 DIAGNOSIS — I5084 End stage heart failure: Secondary | ICD-10-CM | POA: Diagnosis not present

## 2017-05-26 DIAGNOSIS — I4891 Unspecified atrial fibrillation: Secondary | ICD-10-CM | POA: Diagnosis not present

## 2017-05-28 DIAGNOSIS — N182 Chronic kidney disease, stage 2 (mild): Secondary | ICD-10-CM | POA: Diagnosis not present

## 2017-05-28 DIAGNOSIS — I5084 End stage heart failure: Secondary | ICD-10-CM | POA: Diagnosis not present

## 2017-05-28 DIAGNOSIS — I5022 Chronic systolic (congestive) heart failure: Secondary | ICD-10-CM | POA: Diagnosis not present

## 2017-05-28 DIAGNOSIS — I5033 Acute on chronic diastolic (congestive) heart failure: Secondary | ICD-10-CM | POA: Diagnosis not present

## 2017-05-28 DIAGNOSIS — E1122 Type 2 diabetes mellitus with diabetic chronic kidney disease: Secondary | ICD-10-CM | POA: Diagnosis not present

## 2017-05-28 DIAGNOSIS — I4891 Unspecified atrial fibrillation: Secondary | ICD-10-CM | POA: Diagnosis not present

## 2017-05-31 DIAGNOSIS — I2510 Atherosclerotic heart disease of native coronary artery without angina pectoris: Secondary | ICD-10-CM | POA: Diagnosis not present

## 2017-05-31 DIAGNOSIS — I5022 Chronic systolic (congestive) heart failure: Secondary | ICD-10-CM | POA: Diagnosis not present

## 2017-06-01 DIAGNOSIS — E1122 Type 2 diabetes mellitus with diabetic chronic kidney disease: Secondary | ICD-10-CM | POA: Diagnosis not present

## 2017-06-01 DIAGNOSIS — I5033 Acute on chronic diastolic (congestive) heart failure: Secondary | ICD-10-CM | POA: Diagnosis not present

## 2017-06-01 DIAGNOSIS — I5084 End stage heart failure: Secondary | ICD-10-CM | POA: Diagnosis not present

## 2017-06-01 DIAGNOSIS — N182 Chronic kidney disease, stage 2 (mild): Secondary | ICD-10-CM | POA: Diagnosis not present

## 2017-06-01 DIAGNOSIS — I4891 Unspecified atrial fibrillation: Secondary | ICD-10-CM | POA: Diagnosis not present

## 2017-06-01 DIAGNOSIS — I5022 Chronic systolic (congestive) heart failure: Secondary | ICD-10-CM | POA: Diagnosis not present

## 2017-06-07 ENCOUNTER — Inpatient Hospital Stay (HOSPITAL_COMMUNITY)
Admission: EM | Admit: 2017-06-07 | Discharge: 2017-06-12 | DRG: 291 | Disposition: A | Discharge status: Skilled Nursing Facility | Payer: Medicare Other | Attending: Internal Medicine | Admitting: Internal Medicine

## 2017-06-07 ENCOUNTER — Encounter (HOSPITAL_COMMUNITY): Payer: Self-pay | Admitting: Emergency Medicine

## 2017-06-07 ENCOUNTER — Emergency Department (HOSPITAL_COMMUNITY): Payer: Medicare Other

## 2017-06-07 ENCOUNTER — Inpatient Hospital Stay (HOSPITAL_COMMUNITY): Payer: Medicare Other

## 2017-06-07 DIAGNOSIS — L03116 Cellulitis of left lower limb: Secondary | ICD-10-CM | POA: Diagnosis not present

## 2017-06-07 DIAGNOSIS — R0602 Shortness of breath: Secondary | ICD-10-CM | POA: Diagnosis not present

## 2017-06-07 DIAGNOSIS — E875 Hyperkalemia: Secondary | ICD-10-CM | POA: Diagnosis not present

## 2017-06-07 DIAGNOSIS — R404 Transient alteration of awareness: Secondary | ICD-10-CM | POA: Diagnosis not present

## 2017-06-07 DIAGNOSIS — E669 Obesity, unspecified: Secondary | ICD-10-CM | POA: Diagnosis present

## 2017-06-07 DIAGNOSIS — I5040 Unspecified combined systolic (congestive) and diastolic (congestive) heart failure: Secondary | ICD-10-CM | POA: Diagnosis not present

## 2017-06-07 DIAGNOSIS — R5383 Other fatigue: Secondary | ICD-10-CM | POA: Diagnosis not present

## 2017-06-07 DIAGNOSIS — R2681 Unsteadiness on feet: Secondary | ICD-10-CM | POA: Diagnosis not present

## 2017-06-07 DIAGNOSIS — N179 Acute kidney failure, unspecified: Secondary | ICD-10-CM | POA: Diagnosis present

## 2017-06-07 DIAGNOSIS — E11621 Type 2 diabetes mellitus with foot ulcer: Secondary | ICD-10-CM | POA: Diagnosis present

## 2017-06-07 DIAGNOSIS — T501X5A Adverse effect of loop [high-ceiling] diuretics, initial encounter: Secondary | ICD-10-CM | POA: Diagnosis not present

## 2017-06-07 DIAGNOSIS — M7989 Other specified soft tissue disorders: Secondary | ICD-10-CM | POA: Diagnosis not present

## 2017-06-07 DIAGNOSIS — I130 Hypertensive heart and chronic kidney disease with heart failure and stage 1 through stage 4 chronic kidney disease, or unspecified chronic kidney disease: Secondary | ICD-10-CM | POA: Diagnosis present

## 2017-06-07 DIAGNOSIS — I255 Ischemic cardiomyopathy: Secondary | ICD-10-CM | POA: Diagnosis present

## 2017-06-07 DIAGNOSIS — Z794 Long term (current) use of insulin: Secondary | ICD-10-CM

## 2017-06-07 DIAGNOSIS — E1122 Type 2 diabetes mellitus with diabetic chronic kidney disease: Secondary | ICD-10-CM | POA: Diagnosis present

## 2017-06-07 DIAGNOSIS — I5043 Acute on chronic combined systolic (congestive) and diastolic (congestive) heart failure: Principal | ICD-10-CM | POA: Diagnosis present

## 2017-06-07 DIAGNOSIS — N320 Bladder-neck obstruction: Secondary | ICD-10-CM | POA: Diagnosis present

## 2017-06-07 DIAGNOSIS — I96 Gangrene, not elsewhere classified: Secondary | ICD-10-CM | POA: Diagnosis present

## 2017-06-07 DIAGNOSIS — Z6833 Body mass index (BMI) 33.0-33.9, adult: Secondary | ICD-10-CM | POA: Diagnosis not present

## 2017-06-07 DIAGNOSIS — E119 Type 2 diabetes mellitus without complications: Secondary | ICD-10-CM | POA: Diagnosis not present

## 2017-06-07 DIAGNOSIS — L97528 Non-pressure chronic ulcer of other part of left foot with other specified severity: Secondary | ICD-10-CM | POA: Diagnosis present

## 2017-06-07 DIAGNOSIS — R600 Localized edema: Secondary | ICD-10-CM

## 2017-06-07 DIAGNOSIS — I2781 Cor pulmonale (chronic): Secondary | ICD-10-CM | POA: Diagnosis present

## 2017-06-07 DIAGNOSIS — N184 Chronic kidney disease, stage 4 (severe): Secondary | ICD-10-CM | POA: Diagnosis present

## 2017-06-07 DIAGNOSIS — I480 Paroxysmal atrial fibrillation: Secondary | ICD-10-CM | POA: Diagnosis not present

## 2017-06-07 DIAGNOSIS — I340 Nonrheumatic mitral (valve) insufficiency: Secondary | ICD-10-CM | POA: Diagnosis present

## 2017-06-07 DIAGNOSIS — Z833 Family history of diabetes mellitus: Secondary | ICD-10-CM

## 2017-06-07 DIAGNOSIS — I878 Other specified disorders of veins: Secondary | ICD-10-CM | POA: Diagnosis present

## 2017-06-07 DIAGNOSIS — Z87891 Personal history of nicotine dependence: Secondary | ICD-10-CM

## 2017-06-07 DIAGNOSIS — Z66 Do not resuscitate: Secondary | ICD-10-CM

## 2017-06-07 DIAGNOSIS — Z8249 Family history of ischemic heart disease and other diseases of the circulatory system: Secondary | ICD-10-CM

## 2017-06-07 DIAGNOSIS — Z955 Presence of coronary angioplasty implant and graft: Secondary | ICD-10-CM

## 2017-06-07 DIAGNOSIS — E1152 Type 2 diabetes mellitus with diabetic peripheral angiopathy with gangrene: Secondary | ICD-10-CM | POA: Diagnosis present

## 2017-06-07 DIAGNOSIS — L03119 Cellulitis of unspecified part of limb: Secondary | ICD-10-CM | POA: Diagnosis not present

## 2017-06-07 DIAGNOSIS — L03032 Cellulitis of left toe: Secondary | ICD-10-CM

## 2017-06-07 DIAGNOSIS — Z808 Family history of malignant neoplasm of other organs or systems: Secondary | ICD-10-CM

## 2017-06-07 DIAGNOSIS — R55 Syncope and collapse: Secondary | ICD-10-CM | POA: Diagnosis not present

## 2017-06-07 DIAGNOSIS — J9610 Chronic respiratory failure, unspecified whether with hypoxia or hypercapnia: Secondary | ICD-10-CM | POA: Diagnosis not present

## 2017-06-07 DIAGNOSIS — Z9981 Dependence on supplemental oxygen: Secondary | ICD-10-CM

## 2017-06-07 DIAGNOSIS — R791 Abnormal coagulation profile: Secondary | ICD-10-CM | POA: Diagnosis present

## 2017-06-07 DIAGNOSIS — I25110 Atherosclerotic heart disease of native coronary artery with unstable angina pectoris: Secondary | ICD-10-CM | POA: Diagnosis not present

## 2017-06-07 DIAGNOSIS — R531 Weakness: Secondary | ICD-10-CM | POA: Diagnosis not present

## 2017-06-07 DIAGNOSIS — Z9889 Other specified postprocedural states: Secondary | ICD-10-CM | POA: Diagnosis not present

## 2017-06-07 DIAGNOSIS — Z7901 Long term (current) use of anticoagulants: Secondary | ICD-10-CM

## 2017-06-07 DIAGNOSIS — I952 Hypotension due to drugs: Secondary | ICD-10-CM | POA: Diagnosis not present

## 2017-06-07 DIAGNOSIS — L97509 Non-pressure chronic ulcer of other part of unspecified foot with unspecified severity: Secondary | ICD-10-CM | POA: Diagnosis not present

## 2017-06-07 DIAGNOSIS — J9620 Acute and chronic respiratory failure, unspecified whether with hypoxia or hypercapnia: Secondary | ICD-10-CM | POA: Diagnosis present

## 2017-06-07 DIAGNOSIS — N400 Enlarged prostate without lower urinary tract symptoms: Secondary | ICD-10-CM | POA: Diagnosis present

## 2017-06-07 DIAGNOSIS — I2510 Atherosclerotic heart disease of native coronary artery without angina pectoris: Secondary | ICD-10-CM

## 2017-06-07 DIAGNOSIS — R278 Other lack of coordination: Secondary | ICD-10-CM | POA: Diagnosis not present

## 2017-06-07 DIAGNOSIS — Z8052 Family history of malignant neoplasm of bladder: Secondary | ICD-10-CM

## 2017-06-07 DIAGNOSIS — I5084 End stage heart failure: Secondary | ICD-10-CM | POA: Diagnosis not present

## 2017-06-07 DIAGNOSIS — N189 Chronic kidney disease, unspecified: Secondary | ICD-10-CM | POA: Diagnosis present

## 2017-06-07 DIAGNOSIS — Y92230 Patient room in hospital as the place of occurrence of the external cause: Secondary | ICD-10-CM | POA: Diagnosis not present

## 2017-06-07 DIAGNOSIS — N289 Disorder of kidney and ureter, unspecified: Secondary | ICD-10-CM

## 2017-06-07 DIAGNOSIS — L98498 Non-pressure chronic ulcer of skin of other sites with other specified severity: Secondary | ICD-10-CM | POA: Diagnosis present

## 2017-06-07 DIAGNOSIS — M6281 Muscle weakness (generalized): Secondary | ICD-10-CM | POA: Diagnosis not present

## 2017-06-07 DIAGNOSIS — J90 Pleural effusion, not elsewhere classified: Secondary | ICD-10-CM | POA: Diagnosis not present

## 2017-06-07 DIAGNOSIS — Z79899 Other long term (current) drug therapy: Secondary | ICD-10-CM

## 2017-06-07 DIAGNOSIS — L97524 Non-pressure chronic ulcer of other part of left foot with necrosis of bone: Secondary | ICD-10-CM | POA: Diagnosis not present

## 2017-06-07 DIAGNOSIS — L97529 Non-pressure chronic ulcer of other part of left foot with unspecified severity: Secondary | ICD-10-CM

## 2017-06-07 LAB — COMPREHENSIVE METABOLIC PANEL
ALK PHOS: 88 U/L (ref 38–126)
ALT: 22 U/L (ref 17–63)
AST: 20 U/L (ref 15–41)
Albumin: 2.3 g/dL — ABNORMAL LOW (ref 3.5–5.0)
Anion gap: 14 (ref 5–15)
BILIRUBIN TOTAL: 0.7 mg/dL (ref 0.3–1.2)
BUN: 64 mg/dL — ABNORMAL HIGH (ref 6–20)
CALCIUM: 8.2 mg/dL — AB (ref 8.9–10.3)
CO2: 28 mmol/L (ref 22–32)
Chloride: 93 mmol/L — ABNORMAL LOW (ref 101–111)
Creatinine, Ser: 3.2 mg/dL — ABNORMAL HIGH (ref 0.61–1.24)
GFR calc Af Amer: 21 mL/min — ABNORMAL LOW (ref >60)
GFR calc non Af Amer: 18 mL/min — ABNORMAL LOW (ref >60)
GLUCOSE: 111 mg/dL — AB (ref 65–99)
Potassium: 5.4 mmol/L — ABNORMAL HIGH (ref 3.5–5.1)
Sodium: 135 mmol/L (ref 135–145)
Total Protein: 6.5 g/dL (ref 6.5–8.1)

## 2017-06-07 LAB — URINALYSIS, ROUTINE W REFLEX MICROSCOPIC
BILIRUBIN URINE: NEGATIVE
Glucose, UA: NEGATIVE mg/dL
KETONES UR: NEGATIVE mg/dL
LEUKOCYTES UA: NEGATIVE
Nitrite: NEGATIVE
PROTEIN: NEGATIVE mg/dL
SQUAMOUS EPITHELIAL / LPF: NONE SEEN
Specific Gravity, Urine: 1.013 (ref 1.005–1.030)
pH: 5 (ref 5.0–8.0)

## 2017-06-07 LAB — PROTIME-INR
INR: 5.35
Prothrombin Time: 50.5 seconds — ABNORMAL HIGH (ref 11.4–15.2)

## 2017-06-07 LAB — BRAIN NATRIURETIC PEPTIDE: B Natriuretic Peptide: 920.5 pg/mL — ABNORMAL HIGH (ref 0.0–100.0)

## 2017-06-07 LAB — CBG MONITORING, ED: Glucose-Capillary: 103 mg/dL — ABNORMAL HIGH (ref 65–99)

## 2017-06-07 LAB — CBC
HCT: 29.8 % — ABNORMAL LOW (ref 39.0–52.0)
HEMOGLOBIN: 9.1 g/dL — AB (ref 13.0–17.0)
MCH: 28.9 pg (ref 26.0–34.0)
MCHC: 30.5 g/dL (ref 30.0–36.0)
MCV: 94.6 fL (ref 78.0–100.0)
Platelets: 245 10*3/uL (ref 150–400)
RBC: 3.15 MIL/uL — ABNORMAL LOW (ref 4.22–5.81)
RDW: 18.4 % — ABNORMAL HIGH (ref 11.5–15.5)
WBC: 16.6 10*3/uL — ABNORMAL HIGH (ref 4.0–10.5)

## 2017-06-07 LAB — I-STAT CG4 LACTIC ACID, ED: LACTIC ACID, VENOUS: 0.97 mmol/L (ref 0.5–1.9)

## 2017-06-07 LAB — MRSA PCR SCREENING: MRSA by PCR: NEGATIVE

## 2017-06-07 LAB — GLUCOSE, CAPILLARY: GLUCOSE-CAPILLARY: 97 mg/dL (ref 65–99)

## 2017-06-07 MED ORDER — FERROUS SULFATE 325 (65 FE) MG PO TABS
325.0000 mg | ORAL_TABLET | Freq: Every day | ORAL | Status: DC
  Administered 2017-06-08 – 2017-06-12 (×5): 325 mg via ORAL
  Filled 2017-06-07 (×5): qty 1

## 2017-06-07 MED ORDER — VITAMIN B-12 1000 MCG PO TABS
1000.0000 ug | ORAL_TABLET | Freq: Every day | ORAL | Status: DC
  Administered 2017-06-07 – 2017-06-12 (×6): 1000 ug via ORAL
  Filled 2017-06-07 (×6): qty 1

## 2017-06-07 MED ORDER — FLUTICASONE PROPIONATE 50 MCG/ACT NA SUSP: 1.0000 | Freq: Every day | NASAL | Status: DC | PRN

## 2017-06-07 MED ORDER — CALCIUM CARBONATE ANTACID 500 MG PO CHEW: 1.0000 | CHEWABLE_TABLET | Freq: Every day | ORAL | Status: DC | PRN

## 2017-06-07 MED ORDER — ATORVASTATIN CALCIUM 40 MG PO TABS
40.0000 mg | ORAL_TABLET | Freq: Every day | ORAL | Status: DC
  Administered 2017-06-07 – 2017-06-11 (×5): 40 mg via ORAL
  Filled 2017-06-07 (×5): qty 1

## 2017-06-07 MED ORDER — DEXTROSE 5 % IV SOLN
2.0000 g | INTRAVENOUS | Status: DC
  Administered 2017-06-07 – 2017-06-08 (×2): 2 g via INTRAVENOUS
  Filled 2017-06-07 (×3): qty 2

## 2017-06-07 MED ORDER — INSULIN ASPART 100 UNIT/ML ~~LOC~~ SOLN
0.0000 [IU] | Freq: Three times a day (TID) | SUBCUTANEOUS | Status: DC
  Administered 2017-06-08 – 2017-06-09 (×2): 2 [IU] via SUBCUTANEOUS
  Administered 2017-06-09 – 2017-06-10 (×3): 1 [IU] via SUBCUTANEOUS
  Administered 2017-06-10 (×2): 2 [IU] via SUBCUTANEOUS
  Administered 2017-06-11: 1 [IU] via SUBCUTANEOUS
  Administered 2017-06-11 (×2): 2 [IU] via SUBCUTANEOUS
  Administered 2017-06-12 (×2): 1 [IU] via SUBCUTANEOUS

## 2017-06-07 MED ORDER — SODIUM CHLORIDE 0.9 % IV BOLUS (SEPSIS)
500.0000 mL | Freq: Once | INTRAVENOUS | Status: AC
  Administered 2017-06-07: 500 mL via INTRAVENOUS

## 2017-06-07 MED ORDER — AMIODARONE HCL 200 MG PO TABS
200.0000 mg | ORAL_TABLET | Freq: Two times a day (BID) | ORAL | Status: DC
  Administered 2017-06-07 – 2017-06-12 (×10): 200 mg via ORAL
  Filled 2017-06-07 (×10): qty 1

## 2017-06-07 MED ORDER — ACETAMINOPHEN 650 MG RE SUPP: 650.0000 mg | Freq: Four times a day (QID) | RECTAL | Status: DC | PRN

## 2017-06-07 MED ORDER — PIPERACILLIN-TAZOBACTAM 3.375 G IVPB 30 MIN
3.3750 g | Freq: Once | INTRAVENOUS | Status: AC
  Administered 2017-06-07: 3.375 g via INTRAVENOUS
  Filled 2017-06-07: qty 50

## 2017-06-07 MED ORDER — ACETAMINOPHEN 325 MG PO TABS: 650.0000 mg | ORAL_TABLET | Freq: Four times a day (QID) | ORAL | Status: DC | PRN

## 2017-06-07 MED ORDER — METOLAZONE 5 MG PO TABS
5.0000 mg | ORAL_TABLET | Freq: Every day | ORAL | Status: DC
  Administered 2017-06-07 – 2017-06-10 (×4): 5 mg via ORAL
  Filled 2017-06-07 (×4): qty 1

## 2017-06-07 MED ORDER — FUROSEMIDE 10 MG/ML IJ SOLN
120.0000 mg | Freq: Three times a day (TID) | INTRAVENOUS | Status: DC
  Administered 2017-06-07 – 2017-06-11 (×12): 120 mg via INTRAVENOUS
  Filled 2017-06-07: qty 2
  Filled 2017-06-07: qty 12
  Filled 2017-06-07: qty 10
  Filled 2017-06-07 (×2): qty 12
  Filled 2017-06-07: qty 10
  Filled 2017-06-07: qty 12
  Filled 2017-06-07: qty 10
  Filled 2017-06-07: qty 2
  Filled 2017-06-07: qty 10
  Filled 2017-06-07 (×3): qty 12
  Filled 2017-06-07: qty 2
  Filled 2017-06-07 (×2): qty 12
  Filled 2017-06-07: qty 10
  Filled 2017-06-07: qty 12

## 2017-06-07 MED ORDER — MUPIROCIN CALCIUM 2 % EX CREA
TOPICAL_CREAM | Freq: Every day | CUTANEOUS | Status: DC
  Administered 2017-06-07: 1 via TOPICAL
  Administered 2017-06-08 – 2017-06-10 (×3): via TOPICAL
  Filled 2017-06-07 (×2): qty 15

## 2017-06-07 MED ORDER — SODIUM CHLORIDE 0.9% FLUSH
3.0000 mL | Freq: Two times a day (BID) | INTRAVENOUS | Status: DC
  Administered 2017-06-07 – 2017-06-11 (×9): 3 mL via INTRAVENOUS

## 2017-06-07 NOTE — ED Notes (Signed)
Dr. Dorena Dew contacted with critical inr.

## 2017-06-07 NOTE — ED Notes (Signed)
Bladder scan low bladder volume

## 2017-06-07 NOTE — Progress Notes (Signed)
ANTICOAGULATION CONSULT NOTE - Initial Consult  Pharmacy Consult for Heparin  Indication: atrial fibrillation  No Known Allergies  Patient Measurements: Height: 5\' 10"  (177.8 cm) Weight: 260 lb (117.9 kg) IBW/kg (Calculated) : 73 Heparin Dosing Weight: 99.3kg   Vital Signs: Temp: 98.3 F (36.8 C) (08/27 0924) Temp Source: Oral (08/27 0924) BP: 114/64 (08/27 1715) Pulse Rate: 67 (08/27 1715)  Labs:  Recent Labs  06/07/17 1026  HGB 9.1*  HCT 29.8*  PLT 245  LABPROT 50.5*  INR 5.35*  CREATININE 3.20*    Estimated Creatinine Clearance: 27.3 mL/min (A) (by C-G formula based on SCr of 3.2 mg/dL (H)).   Medical History: Past Medical History:  Diagnosis Date  . CAD (coronary artery disease)   . Cellulitis and abscess of lower extremity   . CHF (congestive heart failure) (HCC)    EF 30%  . DM (diabetes mellitus) ([place])       Assessment: Patient presented to the ED with leg swelling and weakness.  The patient has a history of A. Fib and takes warfarin 3 mg daily prior to admission, pharmacy has been consulted to dose heparin as warfarin is held. The last dose of warfarin was noted to be 06/06/2017. An INR was drawn this afternoon and was supratherapeutic at 5.35. No concerns of bleeding noted at this time. Heparin will not be started today and will be held until the INR falls to 2.    Goal of Therapy:  Monitor platelets by anticoagulation protocol: Yes   Plan:  -hold start of heparin until INR less than 2  -continue to check daily INR  -monitor for signs and symptoms of bleeding  Jalene Mullet, Pharm.D. PGY1 Pharmacy Resident 06/07/2017 5:37 PM Main Pharmacy: [phone]

## 2017-06-07 NOTE — ED Triage Notes (Signed)
Pt in from home via [facility] with c/o bilateral leg weakness and swelling. Per EMS, pt takes 80mg  Lasix q day, has not been voiding as frequently as normal. Abdomen distended, legs wrapped in coban, pt states they have been weeping. Hx of CHF, DM and Afib. L bottom foot sore present per pt. Uses 2L home O2, currently on 4L.

## 2017-06-07 NOTE — H&P (Signed)
Date: 06/07/2017               Patient Name:  Christian Garner MRN: [id number]  DOB: 1945-12-27 Age / Sex: 71 y.o., male   PCP: Christian Flow, MD         Medical Service: Internal Medicine Teaching Service         Attending Physician: Dr. Evette Garner, Christian Garner, *    First Contact: Dr. Johny Garner Pager: [phone]  Second Contact: Dr. Juleen Garner Pager: [phone]       After Hours (After 5p/  First Contact Pager: [phone]  weekends / holidays): Second Contact Pager: [phone]   Chief Complaint: Tired  History of Present Illness:  Christian Garner is a 71 year old man with PMH of CAD s/p PCI to the RCA in 2014 and LAD in 2017, HFrEF EF 30-35% with cor pulmonale, PAF on Coumadin, CKD3, T2DM, Obesity, and Chronic Hypoxia on 2-3 L Supplemental O2 at home who presents with worsened functional activity and left foot wound. Patient is intermittently participating in interview. Patient's wife and daughter are present at time of interview who provide some of the history. All other history is obtained from chart review.  Per wife, patient has had progressively worsened functional status over the last few months related to his known advanced heart disease (seen at [place], [place], and [place]). She says he is now limited to laying in bed or only wanting to sit on the side of the bed. He used to be interested in watching TV, however does not appear to have interest in this any longer.   He was admitted at [facility] from 7/30 - 8/3. Hospital course that admission per care everywhere was as follows: "Underwent preliminary work-up for advanced HF therapies including TTE inidicating EF 30 %, severe LV dysfunction, moderate RV dysfunction, moderate MR and TR. RHC 8/2 indicated RA 13, RV 70/5, PA 70/25, PCWP 22, CO 4.0, and CI 2.0. PVR 4.2 woods units. He developed increasing oxygen demands, which improved with diuresis and incentive spirometry. Discharged on home 2 L oxygen therapy. Evaluation for amyloid diease process  started with UPEP, SPEP and FLC. SPEP results could not r/o faint chain component and recommended outpatient follow up in 4-6 months. UPEP pending at time of discharge. Free light chains Kappa/ Lambda ration 1.75 Indicating further studies to r/o amyloid disease may be warranted in the future. Carotid u/s negative for significant ICA stenoses. PFTs demonstrated preserved FEV1/ FVC ration 82 (109% predicted value) with DLCO 7.73 (30 % predicted value)."  He recently followed up with his Cardiologist on 8/20 and was advised to increase his Torsemide to 80 mg BID with Metolazone 2.5 mg due to increase in Wt to 261 lbs (recent hospital d/c wt 238 lbs). Wife states that she held his Torsemide yesterday as she thought he was becoming dehydrated. He was scheduled to see wound care today for his left foot wound, however was brought to the ED today due to his worsened status.   Patient's main complaint today is that he is tired. He says he is continuing to fight to improve. He does seem to understand that there are not many other options in terms of treating his heart failure. He says that he would like to continue treatment, but if medical management not improve his current status, he would not want to hooked up to a machine. He says that in an end of life scenario, he would prefer comfort over prolonged suffering.  In the ED, initial vitals were BP 105/60, Pulse 66, Temp 98.26F, RR 20, SpO2 96% on [place] (Garner volume not specified). Lab work was notable for K 5.4, BUN 64, Cr 3.2 (baseline 1.3-1.5), WBC 16.6, Hgb 9.1 (baseline ~11), BNP 920 (1244 on 7/2), Lactic acid 0.97. Blood cultures were drawn and he was given a dose of Zosyn for cellulitis. He was thought to be volume depleted and given 500 cc NS bolus.  CXR showed increased interstitial edema with cardiomegaly as well as bilateral pleural effusions.  IMTS were called for further management of cellulitis and heart failure.   Meds:  Current Meds  Medication  Sig  . acetaminophen (TYLENOL) 325 MG tablet Take 975 mg by mouth at bedtime as needed for mild pain.   Marland Kitchen amiodarone (PACERONE) 200 MG tablet Take 1 tablet (200 mg total) by mouth 2 (two) times daily.  Marland Kitchen atorvastatin (LIPITOR) 40 MG tablet Take 40 mg by mouth at bedtime.   . bismuth subsalicylate (PEPTO BISMOL) 262 MG/15ML suspension Take 30 mLs by mouth every 6 (six) hours as needed for indigestion.  . calcium carbonate (TUMS - DOSED IN MG ELEMENTAL CALCIUM) 500 MG chewable tablet Chew 1 tablet by mouth daily as needed for indigestion or heartburn.  . ferrous sulfate 325 (65 FE) MG tablet Take 325 mg by mouth daily with breakfast.  . fluticasone (FLONASE) 50 MCG/ACT nasal spray Place 1 spray into both nostrils daily as needed for allergies or rhinitis.  Marland Kitchen lisinopril (PRINIVIL,ZESTRIL) 2.5 MG tablet Take 2.5 mg by mouth daily.  . metolazone (ZAROXOLYN) 5 MG tablet Take 1 tablet (5 mg total) by mouth 2 (two) times a week. Take 1 Tablet Every Monday and Thursday (Patient taking differently: Take 5 mg by mouth 2 (two) times daily. Take 5mg  by mouth 30 minutes prior to taking Torsemide 80mg )  . nitroGLYCERIN (NITROSTAT) 0.4 MG SL tablet Place 0.4 mg under the tongue every 5 (five) minutes as needed for chest pain.  . sitaGLIPtin (JANUVIA) 100 MG tablet Take 100 mg by mouth daily.  . sodium chloride (OCEAN) 0.65 % SOLN nasal spray Place 2 sprays into both nostrils 3 (three) times daily as needed for congestion.  . torsemide (DEMADEX) 20 MG tablet Take 4 tablets (80 mg total) by mouth daily. (Patient taking differently: Take 80 mg by mouth 2 (two) times daily. )  . traZODone (DESYREL) 50 MG tablet Take 50 mg by mouth at bedtime.  . vitamin B-12 (CYANOCOBALAMIN) 1000 MCG tablet Take 1,000 mcg by mouth daily.  Marland Kitchen warfarin (COUMADIN) 5 MG tablet Take 1 tablet (5 mg total) by mouth one time only at 6 PM. (Patient taking differently: Take 3 mg by mouth daily. )     Allergies: Allergies as of 06/07/2017  .  (No Known Allergies)   Past Medical History:  Diagnosis Date  . CAD (coronary artery disease)   . Cellulitis and abscess of lower extremity   . CHF (congestive heart failure) (HCC)    EF 30%  . DM (diabetes mellitus) ([place])     Family History:  Family History  Problem Relation Age of Onset  . Hypertension Mother   . Diabetes Mother   . Bone cancer Mother   . Hypertension Father   . Bladder Cancer Sister      Social History:  Social History   Social History  . Marital status: Married    Spouse name: N/A  . Number of children: N/A  . Years of education: N/A  Social History Main Topics  . Smoking status: Former Research scientist (life sciences)  . Smokeless tobacco: Never Used  . Alcohol use No  . Drug use: No  . Sexual activity: Not Asked   Other Topics Concern  . None   Social History Narrative  . None     Review of Systems: A complete ROS was negative except as per HPI.   Physical Exam: Blood pressure 114/64, pulse 67, temperature 98.3 F (36.8 C), temperature source Oral, resp. rate (!) 25, height 5\' 10"  (1.778 m), weight 260 lb (117.9 kg), SpO2 (!) 88 %. Physical Exam  Constitutional: He appears well-nourished.  Morbidly obese man, intermittently somnolent, awakens to voice to participate in conversation  HENT:  Head: Normocephalic and atraumatic.  Mouth/Throat: No oropharyngeal exudate.  Cardiovascular:  Distant heart sounds, S1, S2. No obvious appreciable murmur  Pulmonary/Chest: Effort normal. No respiratory distress. He has no wheezes. He has no rales.  Abdominal:  Obese abdomen, no tenderness to palpation, no fluid wave. Umbilical hernia.  Musculoskeletal:  Bilateral pitting edema. Left fifth digit appears necrotic without open wound. Bleeding ulcer seen at base of 5th metatarsal on plantar surface without purulent drainage. Non-tender to touch.  Neurological:  Initially somnolent then awoke with increased participation and orientation.  Skin: Skin is warm. There is  erythema.  Erythema bilateral lower extremities L > R up to the knee. LLE appears larger than right.     EKG: personally reviewed my interpretation is Sinus rhythm, 1st degree AV block, RBBB, low voltage  CXR: personally reviewed my interpretation is Interstitial pulmonary edema, cardiomegaly but heart borders are obscured, bilateral pleural effusions, aortic atherosclerosis  Assessment & Plan by Problem: Principal Problem:   Acute on chronic combined systolic and diastolic CHF (congestive heart failure) (HCC) Active Problems:   AKI (acute kidney injury) (HCC)   AF (paroxysmal atrial fibrillation) (HCC)   Cellulitis of left lower extremity   HFrEF (heart failure with reduced ejection fraction) (HCC)   CAD (coronary artery disease)  71 year old man with PMH of CAD s/p PCI to the RCA in 2014 and LAD in 2017, HFrEF EF 30-35% with cor pulmonale, PAF on Coumadin, CKD3, T2DM, Obesity, and Chronic Hypoxia on 2-3 L Supplemental O2 at home who presents with worsened functional activity from end-stage heart failure and left foot wound with cellulitis.   Acute on Chronic Combined Systolic and Diastolic End-Stage CHF: Patient with progressively worsened functional status secondary to his end-stage heart failure. He has been admitted in the past with low output heart failure requiring milrinone, however was not considered a candidate for outpatient milrinone. Options for further management of his heart failure have been limited. His home diuretic regimen has been titrated up, however he has held his Torsemide as he was fearful of becoming dehydrated. Low-dose ACE-Inhibitor was also added in attempts to reverse cardiac remodeling and decrease severity of his mitral insufficiency. Point of care U/S performed by Dr. Evette Garner at the bedside does show an expanded IVC with positive sniff test as well as pleural effusions. It appears that Mr. Spengler is volume overloaded and we will plan to reinitiate aggressive  diuresis with close monitoring of volume, respiratory status, renal function, and electrolytes. - Start IV Lasix 120 mg TID --> Consider Lasix gtt - Metolazone 5 mg   - Monitor BMET, Check Magnesium in AM - Daily weights, Strict I/Os - Place foley for urinary retention and aggressive diuresis - Hold home Lisinopril given acute renal failure - Continue goals of care  discussions   Acute on Chronic Kidney Injury with Oliguric Renal Failure: Patient with elevated creatinine to 3.2 (baseline 1.3-1.5) with oliguria. He has required CRRT on a previous admission. His oliguria is a poor prognostic factor and he likely has a component of cardiorenal syndrome. In and out cath in the ED removed 600 ccs. - Place foley, diuretics, strict I/Os, monitor renal function as above   Cellulitis of LLE with Non-draining Necrotic 5th digit and Bleeding Plantar Ulcer: Patient's chronic left foot wound is worsening per wife. His 5th toe is black in discoloration without open wound. He has a full thickness wound on his left plantar surface with cellulitic changes of his left foot and leg. There is no purulent discharge. Xray without evidence of bony involvement. Will continue IV antibiotics. - IV Ceftriaxone - Monitor CBC - Wound Care consulted; appreciate assistance   Paroxysmal Atrial Fibrillation: CHADSVASc of 4 (CHF, Age, DM, CAD) with 4% yearly stroke risk. Has been on Coumadin outpatient. In sinus on admission. - Hold Coumadin - INR elevated > 5 - Heparin per pharmacy when INR improves - Continue home Amiodarone 200 mg BID  Pleural Effusion: Bilateral effusions seen on CXR, chronic issue. Consider therapeutic thoracentesis.  CAD s/p DES to LAD and RCA: - Continue home Lipitor 40 mg daily  Hyperkalemia: K 5.4. Has had issues with hyperkalemia previously. Anticipate this will come down with Lasix diuresis. No EKG changes requiring calcium gluconate. - Hold ACE-I - Lasix as above - Monitor  BMET  IDDM: On Sitagliptin at home. - SSI-S  Code Status: DNR - Full scope of treatment per my discussion with patient in the presence of wife and daughter.   Dispo: Admit patient to Inpatient with expected length of stay greater than 2 midnights.  Signed: Zada Finders, MD 06/07/2017, 5:52 PM

## 2017-06-07 NOTE — ED Notes (Signed)
Pt sat up in bed with bp of 70/32. MD aware.

## 2017-06-07 NOTE — ED Provider Notes (Signed)
[facility] DEPT Provider Note   CSN: [id number] Arrival date & time: 06/07/17  0867   History   Chief Complaint Chief Complaint  Patient presents with  . Leg Swelling  . Leg Weakness    HPI Christian Garner is a 71 y.o. male.  Presents today with 2 weeks of increased bilateral lower extremity edema, and weakness with standing. Patient has a past medical history significant for CAD, cellulitis, CHF w/ rEF 30% and insulin dependent diabetes mellitus. He states that approximately 2 weeks prior he begin to have difficulty standing for greater than 10-15 seconds. This is associated with a syncopal-like event, wherein he slumps to the floor without completely losing consciousness. This will occur with each attempt to stand, resolves rapidly over time, wherein he is able return to his bed with assistance. He states that his dose of Demadex was recently increased/altered, as well as other possible medications. Patient denies loss of consciousness, dizziness, seizure activity such as tremor loss of bowel or bladder control. States that he has maintained his regular bowel movements, as well as his typical urinary frequency, denying dysuria. Patient states that this is an issue for greater than 2 weeks and has worsened to the extent that he is on no longer able to ambulate to the restroom. Patient appears confused and unable to completely answer all questions.       Past Medical History:  Diagnosis Date  . CAD (coronary artery disease)   . Cellulitis and abscess of lower extremity   . CHF (congestive heart failure) (HCC)    EF 30%  . DM (diabetes mellitus) [facility])     Patient Active Problem List   Diagnosis Date Noted  . HFrEF (heart failure with reduced ejection fraction) ([place]) 06/07/2017  . CAD (coronary artery disease) 06/07/2017  . Pleural effusion, right   . S/P thoracentesis   . Acute renal failure with acute tubular necrosis superimposed on stage 2 chronic kidney disease ([place])     . Cellulitis of left lower extremity   . Goals of care, counseling/discussion   . Palliative care encounter   . Hyponatremia   . Pressure injury of skin 03/20/2017  . Acute on chronic combined systolic and diastolic CHF (congestive heart failure) ([place]) 03/19/2017  . Acute on chronic respiratory failure with hypoxia and hypercapnia ([place]) 03/19/2017  . AF (paroxysmal atrial fibrillation) ([place]) 03/19/2017  . Community acquired pneumonia 03/19/2017  . Pleural effusion   . AKI (acute kidney injury) [facility])     Past Surgical History:  Procedure Laterality Date  . CORONARY ANGIOPLASTY WITH STENT PLACEMENT    . HAND SURGERY     At [facility], around 2014       Home Medications    Prior to Admission medications   Medication Sig Start Date End Date Taking? Authorizing Provider  acetaminophen (TYLENOL) 325 MG tablet Take 975 mg by mouth at bedtime as needed for mild pain.    Yes [provider]  amiodarone (PACERONE) 200 MG tablet Take 1 tablet (200 mg total) by mouth 2 (two) times daily. 04/05/17  Yes Allie Bossier, MD  atorvastatin (LIPITOR) 40 MG tablet Take 40 mg by mouth at bedtime.    Yes [provider]  bismuth subsalicylate (PEPTO BISMOL) 262 MG/15ML suspension Take 30 mLs by mouth every 6 (six) hours as needed for indigestion.   Yes [provider]  calcium carbonate (TUMS - DOSED IN MG ELEMENTAL CALCIUM) 500 MG chewable tablet Chew 1 tablet by  mouth daily as needed for indigestion or heartburn.   Yes [provider]  ferrous sulfate 325 (65 FE) MG tablet Take 325 mg by mouth daily with breakfast.   Yes [provider]  fluticasone (FLONASE) 50 MCG/ACT nasal spray Place 1 spray into both nostrils daily as needed for allergies or rhinitis.   Yes [provider]  lisinopril (PRINIVIL,ZESTRIL) 2.5 MG tablet Take 2.5 mg by mouth daily.   Yes [provider]  metolazone (ZAROXOLYN) 5 MG tablet Take 1 tablet (5 mg total)  by mouth 2 (two) times a week. Take 1 Tablet Every Monday and Thursday Patient taking differently: Take 5 mg by mouth 2 (two) times daily. Take 5mg  by mouth 30 minutes prior to taking Torsemide 80mg  04/12/17  Yes Arbutus Leas, NP  nitroGLYCERIN (NITROSTAT) 0.4 MG SL tablet Place 0.4 mg under the tongue every 5 (five) minutes as needed for chest pain.   Yes [provider]  sitaGLIPtin (JANUVIA) 100 MG tablet Take 100 mg by mouth daily.   Yes [provider]  sodium chloride (OCEAN) 0.65 % SOLN nasal spray Place 2 sprays into both nostrils 3 (three) times daily as needed for congestion.   Yes [provider]  torsemide (DEMADEX) 20 MG tablet Take 4 tablets (80 mg total) by mouth daily. Patient taking differently: Take 80 mg by mouth 2 (two) times daily.  04/12/17  Yes Arbutus Leas, NP  traZODone (DESYREL) 50 MG tablet Take 50 mg by mouth at bedtime.   Yes [provider]  vitamin B-12 (CYANOCOBALAMIN) 1000 MCG tablet Take 1,000 mcg by mouth daily.   Yes [provider]  warfarin (COUMADIN) 5 MG tablet Take 1 tablet (5 mg total) by mouth one time only at 6 PM. Patient taking differently: Take 3 mg by mouth daily.  04/02/17  Yes Allie Bossier, MD  aspirin EC 81 MG tablet Take 1 tablet (81 mg total) by mouth daily. Patient not taking: Reported on 06/07/2017 04/02/17   Allie Bossier, MD  insulin aspart (NOVOLOG) 100 UNIT/ML injection Inject 0-20 Units into the skin 3 (three) times daily with meals. Patient not taking: Reported on 06/07/2017 04/02/17   Allie Bossier, MD  insulin aspart (NOVOLOG) 100 UNIT/ML injection Inject 0-5 Units into the skin at bedtime. Patient not taking: Reported on 06/07/2017 04/02/17   Allie Bossier, MD  insulin glargine (LANTUS) 100 unit/mL SOPN Inject 0.1 mLs (10 Units total) into the skin daily. Patient not taking: Reported on 06/07/2017 04/02/17   Allie Bossier, MD  Insulin Pen Needle (PEN NEEDLES 29GX1/2") 29G X 12MM MISC 10  Units by Does not apply route daily. Patient not taking: Reported on 06/07/2017 04/02/17   Allie Bossier, MD  ondansetron (ZOFRAN) 4 MG tablet Take 1 tablet (4 mg total) by mouth every 6 (six) hours as needed for nausea. Patient not taking: Reported on 06/07/2017 04/02/17   Allie Bossier, MD    Family History Family History  Problem Relation Age of Onset  . Hypertension Mother   . Diabetes Mother   . Bone cancer Mother   . Hypertension Father   . Bladder Cancer Sister     Social History Social History  Substance Use Topics  . Smoking status: Former Research scientist (life sciences)  . Smokeless tobacco: Never Used  . Alcohol use No     Allergies   Patient has no known allergies.   Review of Systems Review of Systems  Constitutional: Negative for  chills and fever.  HENT: Negative for ear pain and sore throat.   Eyes: Negative for pain and visual disturbance.  Respiratory: Positive for shortness of breath and wheezing. Negative for cough.   Cardiovascular: Positive for leg swelling. Negative for chest pain and palpitations.  Gastrointestinal: Negative for abdominal pain and vomiting.  Genitourinary: Negative for dysuria and hematuria.  Musculoskeletal: Negative for arthralgias and back pain.  Skin: Negative for color change and rash.  Neurological: Negative for seizures and syncope.  All other systems reviewed and are negative.    Physical Exam Updated Vital Signs BP (!) 105/48 (BP Location: Left Arm)   Pulse 69   Temp 98 F (36.7 C) (Oral)   Resp 20   Ht 6\' 2"  (1.88 m)   Wt 116.9 kg (257 lb 11.2 oz)   SpO2 92%   BMI 33.09 kg/m   Physical Exam  Constitutional: He is oriented to person, place, and time. He appears well-developed and well-nourished.  HENT:  Head: Normocephalic and atraumatic.  Eyes: Conjunctivae are normal. No scleral icterus.  Neck: Neck supple.  Cardiovascular: Regular rhythm.  Tachycardia present.   Pulmonary/Chest: Effort normal. No stridor. No respiratory  distress. He has decreased breath sounds in the right middle field, the right lower field, the left middle field and the left lower field. He has wheezes in the right upper field and the left upper field.  Abdominal: Soft. He exhibits no distension. There is no tenderness. There is no rebound.  Musculoskeletal: He exhibits tenderness (Ulcer at the base of the fifth digit of the left foot). He exhibits no edema.  Neurological: He is alert and oriented to person, place, and time.  Skin: Skin is warm and dry. Capillary refill takes less than 2 seconds.  Psychiatric: He has a normal mood and affect. His behavior is normal. Thought content normal.  Nursing note and vitals reviewed.  ED Treatments / Results  Labs (all labs ordered are listed, but only abnormal results are displayed) Labs Reviewed  BRAIN NATRIURETIC PEPTIDE - Abnormal; Notable for the following:       Result Value   B Natriuretic Peptide 920.5 (*)    All other components within normal limits  COMPREHENSIVE METABOLIC PANEL - Abnormal; Notable for the following:    Potassium 5.4 (*)    Chloride 93 (*)    Glucose, Bld 111 (*)    BUN 64 (*)    Creatinine, Ser 3.20 (*)    Calcium 8.2 (*)    Albumin 2.3 (*)    GFR calc non Af Amer 18 (*)    GFR calc Af Amer 21 (*)    All other components within normal limits  CBC - Abnormal; Notable for the following:    WBC 16.6 (*)    RBC 3.15 (*)    Hemoglobin 9.1 (*)    HCT 29.8 (*)    RDW 18.4 (*)    All other components within normal limits  URINALYSIS, ROUTINE W REFLEX MICROSCOPIC - Abnormal; Notable for the following:    Hgb urine dipstick LARGE (*)    Bacteria, UA RARE (*)    All other components within normal limits  PROTIME-INR - Abnormal; Notable for the following:    Prothrombin Time 50.5 (*)    INR 5.35 (*)    All other components within normal limits  CBC - Abnormal; Notable for the following:    WBC 16.6 (*)    RBC 3.45 (*)    Hemoglobin 9.8 (*)  HCT 32.7 (*)     RDW 18.5 (*)    All other components within normal limits  COMPREHENSIVE METABOLIC PANEL - Abnormal; Notable for the following:    Sodium 134 (*)    Chloride 95 (*)    BUN 62 (*)    Creatinine, Ser 2.74 (*)    Calcium 8.3 (*)    Albumin 2.1 (*)    GFR calc non Af Amer 22 (*)    GFR calc Af Amer 25 (*)    All other components within normal limits  MAGNESIUM - Abnormal; Notable for the following:    Magnesium 2.6 (*)    All other components within normal limits  PROTIME-INR - Abnormal; Notable for the following:    Prothrombin Time 59.8 (*)    INR 6.61 (*)    All other components within normal limits  CBG MONITORING, ED - Abnormal; Notable for the following:    Glucose-Capillary 103 (*)    All other components within normal limits  MRSA PCR SCREENING  CULTURE, BLOOD (ROUTINE X 2)  CULTURE, BLOOD (ROUTINE X 2)  GLUCOSE, CAPILLARY  I-STAT CG4 LACTIC ACID, ED  POC OCCULT BLOOD, ED    EKG  EKG Interpretation  Date/Time:  Monday June 07 2017 10:22:41 EDT Ventricular Rate:  68 PR Interval:    QRS Duration: 198 QT Interval:  476 QTC Calculation: 507 R Axis:   -135 Text Interpretation:  Sinus rhythm Prolonged PR interval Right bundle branch block Confirmed by Christian Garner [phone]) on 06/07/2017 10:49:40 AM      Radiology Dg Chest 2 View  Result Date: 06/07/2017 CLINICAL DATA:  Shortness of breath. EXAM: CHEST  2 VIEW COMPARISON:  Chest x-ray dated November 27, 2016. FINDINGS: Cardiomegaly. Increased pulmonary vascular congestion. Atherosclerotic calcification of the aortic arch. Moderate right and small left pleural effusions, unchanged. Bibasilar atelectasis. No pneumothorax. No acute osseous abnormality. IMPRESSION: 1. Cardiomegaly with interstitial edema, unchanged. 2. Moderate right and small left pleural effusions with adjacent bibasilar opacities, likely atelectasis. Electronically Signed   By: Titus Dubin M.D.   On: 06/07/2017 10:57   Dg Foot Complete Left  Result  Date: 06/07/2017 CLINICAL DATA:  Left foot and ankle swelling and redness associated with a diabetic foot ulcer. EXAM: LEFT FOOT - COMPLETE 3+ VIEW COMPARISON:  None. FINDINGS: Diffuse distal soft tissue swelling. Small soft tissue defect ventral to the fifth MTP joint. No bone destruction or periosteal reaction. No soft tissue gas. Moderate-sized calcaneal spurs. IMPRESSION: Small ulceration ventral to the fifth MTP joint with diffuse distal soft tissue swelling and no bony changes of osteomyelitis. Electronically Signed   By: Claudie Revering M.D.   On: 06/07/2017 14:34    Procedures Procedures (including critical care time)  Medications Ordered in ED Medications  sodium chloride flush (NS) 0.9 % injection 3 mL (3 mLs Intravenous Given 06/07/17 2159)  acetaminophen (TYLENOL) tablet 650 mg (not administered)    Or  acetaminophen (TYLENOL) suppository 650 mg (not administered)  cefTRIAXone (ROCEPHIN) 2 g in dextrose 5 % 50 mL IVPB (0 g Intravenous Stopped 06/07/17 1744)  amiodarone (PACERONE) tablet 200 mg (200 mg Oral Given 06/07/17 1745)  atorvastatin (LIPITOR) tablet 40 mg (40 mg Oral Given 06/07/17 2157)  calcium carbonate (TUMS - dosed in mg elemental calcium) chewable tablet 200 mg of elemental calcium (not administered)  ferrous sulfate tablet 325 mg (not administered)  fluticasone (FLONASE) 50 MCG/ACT nasal spray 1 spray (not administered)  vitamin B-12 (CYANOCOBALAMIN) tablet 1,000 mcg (1,000 mcg  Oral Given 06/07/17 2319)  furosemide (LASIX) 120 mg in dextrose 5 % 50 mL IVPB (0 mg Intravenous Stopped 06/07/17 2316)  mupirocin cream (BACTROBAN) 2 % (1 application Topical Given 06/07/17 2216)  insulin aspart (novoLOG) injection 0-9 Units (0 Units Subcutaneous Not Given 06/07/17 1756)  metolazone (ZAROXOLYN) tablet 5 mg (5 mg Oral Given 06/07/17 2158)  chlorhexidine (PERIDEX) 0.12 % solution 15 mL (15 mLs Mouth Rinse Given 06/08/17 0210)  MEDLINE mouth rinse (not administered)  sodium chloride 0.9 %  bolus 500 mL (0 mLs Intravenous Stopped 06/07/17 1514)  piperacillin-tazobactam (ZOSYN) IVPB 3.375 g (0 g Intravenous Stopped 06/07/17 1325)     Initial Impression / Assessment and Plan / ED Course  I have reviewed the triage vital signs and the nursing notes.  Pertinent labs & imaging results that were available during my care of the patient were reviewed by me and considered in my medical decision making (see chart for details).    Given his presentation the patient is suffering from overdiuresis versus anemia versus progression of his CHG. In addition, given his probable cellulitis and infection of the ulcer at the base of his left foot, he may also be suffering from systemic infection increasing the alteration of his mentation.  Of concern worsening of his congestive heart failure, MI, pulmonary embolism, pneumonia, anemia and sepsis was considered.  10 AM, patient states that he is uncomfortable, thirsty, and needs to urinate. Nurse notified. Patient informed of plan to complete labs and await families arrival for additional history. Patient is in agreement with this plan.  10:20 AM, Ordered CBC and CMP for renal/hepatic function evaluation as well as hemoglobin. EKG pending, DG chest 2 view for SOB as well as cardiac monitoring given his difficulty to remain alert.   10:53 AM, Ordered POC fecal blood test(patient refused), and UA given CBC results.   11:10 AM, Patients compression bandage removed from both distal lower extremities. Significant pitting edema of the left foot as well as a 2-3cm ulcer on the base of the left fifth metatarsal. Erythema and edema present without evidence of blood streaking. Daughter and spouse have arrived. They state that the patient hasn't urinated in the past 14 hours, although he has made multiple attempts to do so.  In/Out cath and bladder scan requested.  11:30 AM, Given his presentation and clinical evidence of cellulitis, leukocytosis, significantly  increased creatinine (AKI), recent immobility, pulmonary edema, mild hyperkalemia and generalized weakness/confusion, admission for antibiotic treatment and treatment of his other comorbidity's is recommended as further evaluation is warranted. Placed consult to unassigned. In/Out cath and bladder scan requested.   Internal medicine training service called to accept patient. Dr. Roxanne Mins came to see the patient for admission. Will defer further treatment to their capable hands.   Final Clinical Impressions(s) / ED Diagnoses   Final diagnoses:  Bilateral lower extremity edema  Syncope and collapse  Generalized weakness  Cellulitis of left leg  Ulcer of left foot, unspecified ulcer stage (HCC)  Cellulitis of fifth toe of left foot  Acute renal disease  Foot ulcer, left [facility])    New Prescriptions Current Discharge Medication List       Christian Ludwig, MD 06/08/17 Merryl Hacker    Christian Saver, MD 06/08/17 [phone]

## 2017-06-07 NOTE — Consult Note (Addendum)
[facility] Nurse wound consult note Reason for Consult: Consult requested for left plantar foot wound and left 5th toe.  Pt states he fell last week.  Left 5th toe is dark purple-black; 2X1cm area affected.  X-ray does not indicate fracture or osteomyelitis.  Pt could benefit from ABI to assess perfusion; there is no open wound requiring topical treatment to left 5th toe. Left leg with generalized edema and erythremia; appearance consistent with cellulitis. Left plantar foot with chronic full thickness wound.  Pt was due to go to the outpatient wound care center for assessment but never made the appointment related to this admission, according to the family members. Wound type: Full thickness dry callous area surrounding open wound; 1.5X3X.2cm, dry red wound bed, small amt red drainage, no odor of fluctuance. Dressing procedure/placement/frequency: Bactroban to promote moist healing and provide antimicrobial benifits.  Discussed plan of care with patient and family members at the bedside. Please re-consult if further assistance is needed.  Thank-you,  Julien Girt MSN, [place], [place], [place], [place]

## 2017-06-07 NOTE — ED Notes (Signed)
Went in to get pts orthostatic vitals. Pt stated he was unable to stand and how weak he felt.

## 2017-06-07 NOTE — ED Notes (Signed)
Admitting MD at bedside.

## 2017-06-08 DIAGNOSIS — N179 Acute kidney failure, unspecified: Secondary | ICD-10-CM

## 2017-06-08 DIAGNOSIS — I5043 Acute on chronic combined systolic (congestive) and diastolic (congestive) heart failure: Principal | ICD-10-CM

## 2017-06-08 DIAGNOSIS — I480 Paroxysmal atrial fibrillation: Secondary | ICD-10-CM

## 2017-06-08 DIAGNOSIS — I2510 Atherosclerotic heart disease of native coronary artery without angina pectoris: Secondary | ICD-10-CM

## 2017-06-08 LAB — COMPREHENSIVE METABOLIC PANEL
ALBUMIN: 2.1 g/dL — AB (ref 3.5–5.0)
ALT: 21 U/L (ref 17–63)
AST: 16 U/L (ref 15–41)
Alkaline Phosphatase: 82 U/L (ref 38–126)
Anion gap: 9 (ref 5–15)
BUN: 62 mg/dL — ABNORMAL HIGH (ref 6–20)
CHLORIDE: 95 mmol/L — AB (ref 101–111)
CO2: 30 mmol/L (ref 22–32)
Calcium: 8.3 mg/dL — ABNORMAL LOW (ref 8.9–10.3)
Creatinine, Ser: 2.74 mg/dL — ABNORMAL HIGH (ref 0.61–1.24)
GFR calc non Af Amer: 22 mL/min — ABNORMAL LOW (ref >60)
GFR, EST AFRICAN AMERICAN: 25 mL/min — AB (ref >60)
Glucose, Bld: 97 mg/dL (ref 65–99)
Potassium: 5.1 mmol/L (ref 3.5–5.1)
SODIUM: 134 mmol/L — AB (ref 135–145)
Total Bilirubin: 0.9 mg/dL (ref 0.3–1.2)
Total Protein: 6.5 g/dL (ref 6.5–8.1)

## 2017-06-08 LAB — PROTIME-INR
INR: 6.61 — AB
PROTHROMBIN TIME: 59.8 s — AB (ref 11.4–15.2)

## 2017-06-08 LAB — GLUCOSE, CAPILLARY
GLUCOSE-CAPILLARY: 92 mg/dL (ref 65–99)
GLUCOSE-CAPILLARY: 112 mg/dL — AB (ref 65–99)
GLUCOSE-CAPILLARY: 181 mg/dL — AB (ref 65–99)
GLUCOSE-CAPILLARY: 147 mg/dL — AB (ref 65–99)

## 2017-06-08 LAB — OCCULT BLOOD X 1 CARD TO LAB, STOOL: Fecal Occult Bld: NEGATIVE

## 2017-06-08 LAB — CBC
HCT: 32.7 % — ABNORMAL LOW (ref 39.0–52.0)
Hemoglobin: 9.8 g/dL — ABNORMAL LOW (ref 13.0–17.0)
MCH: 28.4 pg (ref 26.0–34.0)
MCHC: 30 g/dL (ref 30.0–36.0)
MCV: 94.8 fL (ref 78.0–100.0)
PLATELETS: 280 10*3/uL (ref 150–400)
RBC: 3.45 MIL/uL — AB (ref 4.22–5.81)
RDW: 18.5 % — ABNORMAL HIGH (ref 11.5–15.5)
WBC: 16.6 10*3/uL — AB (ref 4.0–10.5)

## 2017-06-08 LAB — MAGNESIUM: Magnesium: 2.6 mg/dL — ABNORMAL HIGH (ref 1.7–2.4)

## 2017-06-08 MED ORDER — PHYTONADIONE 5 MG PO TABS
5.0000 mg | ORAL_TABLET | Freq: Once | ORAL | Status: AC
  Administered 2017-06-08: 5 mg via ORAL
  Filled 2017-06-08: qty 1

## 2017-06-08 MED ORDER — ORAL CARE MOUTH RINSE
15.0000 mL | Freq: Two times a day (BID) | OROMUCOSAL | Status: DC
  Administered 2017-06-08 – 2017-06-11 (×7): 15 mL via OROMUCOSAL

## 2017-06-08 MED ORDER — OXYMETAZOLINE HCL 0.05 % NA SOLN
1.0000 | Freq: Two times a day (BID) | NASAL | Status: DC
  Administered 2017-06-08 – 2017-06-12 (×8): 1 via NASAL
  Filled 2017-06-08: qty 15

## 2017-06-08 MED ORDER — LORATADINE 10 MG PO TABS: 10.0000 mg | ORAL_TABLET | Freq: Every day | ORAL | Status: DC

## 2017-06-08 MED ORDER — CHLORHEXIDINE GLUCONATE 0.12 % MT SOLN
15.0000 mL | Freq: Two times a day (BID) | OROMUCOSAL | Status: DC
Start: 2017-06-08 — End: 2017-06-12
  Administered 2017-06-08 – 2017-06-12 (×10): 15 mL via OROMUCOSAL
  Filled 2017-06-08 (×6): qty 15

## 2017-06-08 NOTE — Consult Note (Signed)
Advanced Heart Failure Team Consult Note  Primary Cardiologist:  Dr. Haroldine Laws   Reason for Consultation: Acute on chronic systolic heart failure  HPI:    RACER QUAM is seen today for evaluation of heart failure at the request of Dr. Evette Doffing.   KARY COLAIZZI is a 71 y.o. male with h/o chronic systolic CHF, Chronic respiratory failure on home 02, CAD s/p DES to LAD in 03/2016, CKD, and DM.   Last seen in HF clinic 04/12/2017. ReDs vest reading was 51% earlier that day. Pt could not stand for weight. He was markedly volume overloaded on exam with conversational dyspnea. Lasix changed to torsemide and given 5 mg metolazone BID for 4 days and then chronically.   He is being followed at [facility] cardiology clinic and has failed adjustment of outpatient diuretics.   Admitted 06/07/17 with difficulty controlling fluid status and worsening SOB and fatigue. Pertinent labs on admission include K 5.4. Creatinine 3.2, BUN 64, WBC 16.6, BNP 920, and lactic acid 0.97. HF team asked to see with end-stage CHF.   Feeling tired. Breathing improved. His urine output has picked up on high dose lasix.  Main complaint is fatigue. Denies lightheadedness or dizziness.  + mild orthopnea. Not very active.   CXR with cardiomegaly, edema, and moderate R and small L pleural effusions.   Review of Systems: [y] = yes, [ ]  = no   General: Weight gain [y]; Weight loss [ ] ; Anorexia [ ] ; Fatigue [y]; Fever [ ] ; Chills [ ] ; Weakness [y]  Cardiac: Chest pain/pressure [ ] ; Resting SOB [ ] ; Exertional SOB [y]; Orthopnea [y]; Pedal Edema [y]; Palpitations [ ] ; Syncope [ ] ; Presyncope [ ] ; Paroxysmal nocturnal dyspnea[ ]   Pulmonary: Cough [ ] ; Wheezing[ ] ; Hemoptysis[ ] ; Sputum [ ] ; Snoring [ ]   GI: Vomiting[ ] ; Dysphagia[ ] ; Melena[ ] ; Hematochezia [ ] ; Heartburn[ ] ; Abdominal pain [ ] ; Constipation [ ] ; Diarrhea [ ] ; BRBPR [ ]   GU: Hematuria[ ] ; Dysuria [ ] ; Nocturia[ ]   Vascular: Pain in legs with walking [ ] ; Pain  in feet with lying flat [ ] ; Non-healing sores [ ] ; Stroke [ ] ; TIA [ ] ; Slurred speech [ ] ;  Neuro: Headaches[ ] ; Vertigo[ ] ; Seizures[ ] ; Paresthesias[ ] ;Blurred vision [ ] ; Diplopia [ ] ; Vision changes [ ]   Ortho/Skin: Arthritis [y]; Joint pain [y]; Muscle pain [ ] ; Joint swelling [ ] ; Back Pain [ ] ; Rash [ ]   Psych: Depression[ ] ; Anxiety[ ]   Heme: Bleeding problems [ ] ; Clotting disorders [ ] ; Anemia [ ]   Endocrine: Diabetes [ ] ; Thyroid dysfunction[ ]   Home Medications Prior to Admission medications   Medication Sig Start Date End Date Taking? Authorizing Provider  acetaminophen (TYLENOL) 325 MG tablet Take 975 mg by mouth at bedtime as needed for mild pain.    Yes [provider]  amiodarone (PACERONE) 200 MG tablet Take 1 tablet (200 mg total) by mouth 2 (two) times daily. 04/05/17  Yes Allie Bossier, MD  atorvastatin (LIPITOR) 40 MG tablet Take 40 mg by mouth at bedtime.    Yes [provider]  bismuth subsalicylate (PEPTO BISMOL) 262 MG/15ML suspension Take 30 mLs by mouth every 6 (six) hours as needed for indigestion.   Yes [provider]  calcium carbonate (TUMS - DOSED IN MG ELEMENTAL CALCIUM) 500 MG chewable tablet Chew 1 tablet by mouth daily as needed for indigestion or heartburn.   Yes [provider]  ferrous sulfate 325 (65 FE)  MG tablet Take 325 mg by mouth daily with breakfast.   Yes [provider]  fluticasone (FLONASE) 50 MCG/ACT nasal spray Place 1 spray into both nostrils daily as needed for allergies or rhinitis.   Yes [provider]  lisinopril (PRINIVIL,ZESTRIL) 2.5 MG tablet Take 2.5 mg by mouth daily.   Yes [provider]  metolazone (ZAROXOLYN) 5 MG tablet Take 1 tablet (5 mg total) by mouth 2 (two) times a week. Take 1 Tablet Every Monday and Thursday Patient taking differently: Take 5 mg by mouth 2 (two) times daily. Take 5mg  by mouth 30 minutes prior to taking Torsemide 80mg  04/12/17  Yes Arbutus Leas, NP  nitroGLYCERIN (NITROSTAT) 0.4 MG SL tablet Place 0.4 mg under the tongue every 5 (five) minutes as needed for chest pain.   Yes [provider]  sitaGLIPtin (JANUVIA) 100 MG tablet Take 100 mg by mouth daily.   Yes [provider]  sodium chloride (OCEAN) 0.65 % SOLN nasal spray Place 2 sprays into both nostrils 3 (three) times daily as needed for congestion.   Yes [provider]  torsemide (DEMADEX) 20 MG tablet Take 4 tablets (80 mg total) by mouth daily. Patient taking differently: Take 80 mg by mouth 2 (two) times daily.  04/12/17  Yes Arbutus Leas, NP  traZODone (DESYREL) 50 MG tablet Take 50 mg by mouth at bedtime.   Yes [provider]  vitamin B-12 (CYANOCOBALAMIN) 1000 MCG tablet Take 1,000 mcg by mouth daily.   Yes [provider]  warfarin (COUMADIN) 5 MG tablet Take 1 tablet (5 mg total) by mouth one time only at 6 PM. Patient taking differently: Take 3 mg by mouth daily.  04/02/17  Yes Allie Bossier, MD  aspirin EC 81 MG tablet Take 1 tablet (81 mg total) by mouth daily. Patient not taking: Reported on 06/07/2017 04/02/17   Allie Bossier, MD  insulin aspart (NOVOLOG) 100 UNIT/ML injection Inject 0-20 Units into the skin 3 (three) times daily with meals. Patient not taking: Reported on 06/07/2017 04/02/17   Allie Bossier, MD  insulin aspart (NOVOLOG) 100 UNIT/ML injection Inject 0-5 Units into the skin at bedtime. Patient not taking: Reported on 06/07/2017 04/02/17   Allie Bossier, MD  insulin glargine (LANTUS) 100 unit/mL SOPN Inject 0.1 mLs (10 Units total) into the skin daily. Patient not taking: Reported on 06/07/2017 04/02/17   Allie Bossier, MD  Insulin Pen Needle (PEN NEEDLES 29GX1/2") 29G X 12MM MISC 10 Units by Does not apply route daily. Patient not taking: Reported on 06/07/2017 04/02/17   Allie Bossier, MD  ondansetron (ZOFRAN) 4 MG tablet Take 1 tablet (4 mg total) by mouth every 6 (six) hours as needed for  nausea. Patient not taking: Reported on 06/07/2017 04/02/17   Allie Bossier, MD    Past Medical History: Past Medical History:  Diagnosis Date  . CAD (coronary artery disease)   . Cellulitis and abscess of lower extremity   . CHF (congestive heart failure) (HCC)    EF 30%  . DM (diabetes mellitus) ([place])     Past Surgical History: Past Surgical History:  Procedure Laterality Date  . CORONARY ANGIOPLASTY WITH STENT PLACEMENT    . HAND SURGERY     At [facility], around 2014    Family History: Family History  Problem Relation Age of Onset  . Hypertension Mother   . Diabetes Mother   . Bone cancer Mother   .  Hypertension Father   . Bladder Cancer Sister     Social History: Social History   Social History  . Marital status: Married    Spouse name: N/A  . Number of children: N/A  . Years of education: N/A   Social History Main Topics  . Smoking status: Former Research scientist (life sciences)  . Smokeless tobacco: Never Used  . Alcohol use No  . Drug use: No  . Sexual activity: Not Asked   Other Topics Concern  . None   Social History Narrative  . None    Allergies:  No Known Allergies  Objective:    Vital Signs:   Temp:  [98 F (36.7 C)-98.4 F (36.9 C)] 98.2 F (36.8 C) (08/28 0830) Pulse Rate:  [62-137] 73 (08/28 0900) Resp:  [15-26] 16 (08/28 0900) BP: (70-144)/(32-73) 105/55 (08/28 0830) SpO2:  [81 %-100 %] 86 % (08/28 0900) Weight:  [257 lb 11.2 oz (116.9 kg)] 257 lb 11.2 oz (116.9 kg) (08/27 2100) Last BM Date: 06/06/17  Weight change: Filed Weights   06/07/17 4097 06/07/17 2100  Weight: 260 lb (117.9 kg) 257 lb 11.2 oz (116.9 kg)    Intake/Output:   Intake/Output Summary (Last 24 hours) at 06/08/17 1025 Last data filed at 06/08/17 0600  Gross per 24 hour  Intake              422 ml  Output             1425 ml  Net            -1003 ml      Physical Exam   General: Chronically ill appearing male. + Venti mask HEENT: Normal Neck: supple. JVP 10-12  cm. Carotids 2+ bilat; no bruits. No lymphadenopathy or thyromegaly appreciated. Cor: PMI nondisplaced. Regular rate & rhythm. No rubs, gallops or murmurs. Lungs: Diminished basilar sounds with bilateral crackles. R lung diminished to middle lung field.  Abdomen: soft, nontender, nondistended. No hepatosplenomegaly. No bruits or masses. Good bowel sounds. Extremities: no cyanosis, clubbing or rash. 2-3+ edema to knees.   Neuro: alert & orientedx3, cranial nerves grossly intact. moves all 4 extremities w/o difficulty. Affect pleasant  Telemetry   Personally reviewed, NSR  EKG    Personally reviewed, NSR 68 bpm  Labs   Basic Metabolic Panel:  Recent Labs Lab 06/07/17 1026 06/08/17 0218  NA 135 134*  K 5.4* 5.1  CL 93* 95*  CO2 28 30  GLUCOSE 111* 97  BUN 64* 62*  CREATININE 3.20* 2.74*  CALCIUM 8.2* 8.3*  MG  --  2.6*    Liver Function Tests:  Recent Labs Lab 06/07/17 1026 06/08/17 0218  AST 20 16  ALT 22 21  ALKPHOS 88 82  BILITOT 0.7 0.9  PROT 6.5 6.5  ALBUMIN 2.3* 2.1*   No results for input(s): LIPASE, AMYLASE in the last 168 hours. No results for input(s): AMMONIA in the last 168 hours.  CBC:  Recent Labs Lab 06/07/17 1026 06/08/17 0218  WBC 16.6* 16.6*  HGB 9.1* 9.8*  HCT 29.8* 32.7*  MCV 94.6 94.8  PLT 245 280    Cardiac Enzymes: No results for input(s): CKTOTAL, CKMB, CKMBINDEX, TROPONINI in the last 168 hours.  BNP: BNP (last 3 results)  Recent Labs  12/29/16 0355 04/12/17 1440 06/07/17 1026  BNP 236.2* 1,244.8* 920.5*    ProBNP (last 3 results) No results for input(s): PROBNP in the last 8760 hours.   CBG:  Recent Labs Lab 06/07/17 1753 06/07/17 2124 06/08/17  [place]    Coagulation Studies:  Recent Labs  06/07/17 1026 06/08/17 0218  LABPROT 50.5* 59.8*  INR 5.35* 6.61*     Imaging   Dg Chest 2 View  Result Date: 06/07/2017 CLINICAL DATA:  Shortness of breath. EXAM: CHEST  2 VIEW  COMPARISON:  Chest x-ray dated November 27, 2016. FINDINGS: Cardiomegaly. Increased pulmonary vascular congestion. Atherosclerotic calcification of the aortic arch. Moderate right and small left pleural effusions, unchanged. Bibasilar atelectasis. No pneumothorax. No acute osseous abnormality. IMPRESSION: 1. Cardiomegaly with interstitial edema, unchanged. 2. Moderate right and small left pleural effusions with adjacent bibasilar opacities, likely atelectasis. Electronically Signed   By: Titus Dubin M.D.   On: 06/07/2017 10:57   Dg Foot Complete Left  Result Date: 06/07/2017 CLINICAL DATA:  Left foot and ankle swelling and redness associated with a diabetic foot ulcer. EXAM: LEFT FOOT - COMPLETE 3+ VIEW COMPARISON:  None. FINDINGS: Diffuse distal soft tissue swelling. Small soft tissue defect ventral to the fifth MTP joint. No bone destruction or periosteal reaction. No soft tissue gas. Moderate-sized calcaneal spurs. IMPRESSION: Small ulceration ventral to the fifth MTP joint with diffuse distal soft tissue swelling and no bony changes of osteomyelitis. Electronically Signed   By: Claudie Revering M.D.   On: 06/07/2017 14:34      Medications:     Current Medications: . amiodarone  200 mg Oral BID  . atorvastatin  40 mg Oral QHS  . chlorhexidine  15 mL Mouth Rinse BID  . ferrous sulfate  325 mg Oral Q breakfast  . insulin aspart  0-9 Units Subcutaneous TID WC  . mouth rinse  15 mL Mouth Rinse q12n4p  . metolazone  5 mg Oral Daily  . mupirocin cream   Topical Daily  . oxymetazoline  1 spray Each Nare BID  . sodium chloride flush  3 mL Intravenous Q12H  . vitamin B-12  1,000 mcg Oral Daily     Infusions: . cefTRIAXone (ROCEPHIN)  IV Stopped (06/07/17 1744)  . furosemide 120 mg (06/08/17 0854)       Patient Profile   BUNNIE LEDERMAN is a 71 y.o. male with h/o chronic systolic CHF, Chronic respiratory failure on home 02, CAD s/p DES to LAD in 03/2016, CKD, and DM.   Admitted with  volume overload and fatigue. Found to have marked AKI on admission labs.   Assessment/Plan   1. Chronic systolic CHF: ICM, LHC in June 2017 [facility] with DES to LAD. Also with DES to RCA in 2014 Echo 03/2017 LVEF 30-35%, severe LVH, mild/mod MR, severe LAE, Moderate RV, Moderate RAE.   - NYHA class IIIb symptoms.  - Volume overloaded on exam. Agree with high dose IV lasix and follow response (Currently ordered for 120 mg BID) - No ACE/ARB/ARNI with CKD. He had hyperkalemia with losartan.  - With advanced disease and co-morbidities, pt is not candidate for advanced therapies.  2. PAF - NSR in by EKG - Continue amio 200 mg BID.  - CHA2DS2-VASc is at least 4.  - Continue warfarin for anticoagulation  3. Acute on chronic respiratory failure - Sats diminished. Likely multifactorial. Improved on venti mask - Continue to diurese. If no improvement may need to consider repeat thoracentesis with moderate pleural effusion on R.   4. AKI on CKD stage III (baseline 1.3 - 1.5) - Creatinine elevated on admission on high dose diuretics, but remains volume overloaded.  - Creatinine somewhat improved overnight. Continue to follow  closely with diuresis.  - He is not candidate for HD.    5. History of R pleural Effusion - S/P Thoracentesis in 03/2017 - CXR with marked edema. Continue to diurese.   6. CAD- Multivessel CAD - s/p DES LAD 6/17, mod stenosis circumflex. - Continue ASA - No s/s ischemia.   7. Deconditioning - PT following.   Pt is very tenuous and has end stage disease. Would likely benefit from palliative conversation this admission.   Length of Stay: 1  Annamaria Helling  06/08/2017, 10:25 AM  Advanced Heart Failure Team Pager [phone] (M-F; 7a - 4p)  Please contact [facility] for night-coverage after hours (4p -7a ) and weekends on [email or web address]  Patient seen and examined with the above-signed Advanced Practice Provider and/or Housestaff. I personally  reviewed laboratory data, imaging studies and relevant notes. I independently examined the patient and formulated the important aspects of the plan. I have edited the note to reflect any of my changes or salient points. I have personally discussed the plan with the patient and/or family.  71 y/o with end-stage HF. Recently admitted with marked volume overload and required milrinone support. Now readmitted with likely 30 pounds of volume overload. He is responding to IV diuretics. Renal function improving with diuresis. Remains in NSR. Will continue to diurese. Previously determined not to be a candidate for advanced HF therapies. CAD is stable without evidence of ischemia.   Glori Bickers, MD  11:23 PM

## 2017-06-08 NOTE — Progress Notes (Addendum)
CRITICAL VALUE ALERT  Critical Value:  INR 6.61 Date & Time Notied: 06/08/2017  0400  Provider Notified: Dr Trilby Drummer  Orders Received/Actions taken: No orders received at this time

## 2017-06-08 NOTE — Progress Notes (Signed)
Subjective: Reports good urine output with diuretic regimen, down 3 lbs from yesterday. Nurse noted low oxygen saturations to mid 80s on 6 L [place], pt noted to be breathing through mouth and states his nose is congested, denies feeling SOB. Denies any signs of bleeding   Objective:  Vital signs in last 24 hours: Vitals:   06/08/17 0600 06/08/17 0830 06/08/17 0839 06/08/17 0900  BP:  (!) 105/55    Pulse: 69   73  Resp: 20   16  Temp:  98.2 F (36.8 C)    TempSrc:  Oral    SpO2: 92% (!) 85% (!) 81% (!) 86%  Weight:      Height:       Physical Exam  Constitutional: He is oriented to person, place, and time.  Elderly gentleman sitting up in bed on [place]   HENT:  Head: Normocephalic and atraumatic.  Cardiovascular: Normal rate and regular rhythm.   Abdominal:  Obese abdomen  Musculoskeletal:  Bilateral LE edema, blanching erythema   Neurological: He is alert and oriented to person, place, and time.  Skin: Skin is warm.     Assessment/Plan:  Acute on Chronic Combine Systolic and Diastolic HF,  Ischemic Cardiomyopathy  Pt with advanced heart failure, EF 30-35% with cor pulmonale, and unfortunately, limited options for further treatment. He presented with increased weight (est dry weight ~238 lbs), decreased function status, with chronic pleural effusions and evidence of volume overload, confirmed with POC [place] of IVC. Aggressive diuresis with Lasix + metolazone was initiated with caution to avoid decreasing his pre-load too abruptly with R sided failure. He understands his prognosis and will continue medical therapy until he feels his life is being prolonged without meaningful quality, goal is to return home. He has responded well with good urine output and reduction in weight. His respiratory status and symptoms may also improve with therapeutic thoracentesis if his pleural effusions do not decrease in size.   --Continue IV Lasix 120 mg TID + Metolazone 5 mg  --BMP + Mg, Daily weights,  strict I/Os --Heart Failure Consult   Acute on Chronic Kidney Injury Pt presented with oliguria, Cr elevated to 3.2 (baseline ~1.3-1.5). In the past he has required CRRT. A foley was placed and he has had good urine output and improvement in his Cr with diuresis, consistent with a cardiorenal etiology of his worsening kidney function. Cr currently 2.74  --Continue diuresis, monitoring as above   Cellulitis of LLE Pt has a chronic wound to L 5th digit likely due to poor perfusion, no purulent discharge of bone involvement. He was started on IV Ceftriaxone. His erythema of his LEs seem to have a large component of venous stasis, though he did present with an elevated white count. Will continue to monitor, may elect to discontinue antibiotics after ~48 hours.   --Continue Ceftriaxone     Paroxysmal Atrial Fibrillation Pt has been in sinus rhythm during admission, is on home Amiodarone 200 mg BID and Coumadin. Supra-therapeutic INR on admission, currently holding home Coumadin with plan to start heparin when INR at 2. With potential for thoracentesis for symptomatic relief, will administer vitamin K to aid in INR recovery.  --Daily PT/INR --Monitor for bleeding --Oral Vitamin K   Nasal Congestion  Pt reports blowing nose, feeling congested-no humidifier on wall oxygen in room, on chronic home O2 2 L. Recent desaturations may be due to failed delivery of oxygen due to congestion, will deliver via mask and treat for nasal congestion.  --  Afrin nasal spray  --O2 via mask, wean as tolerated    Dispo: Anticipated discharge in approximately 2-3 day(s).   Tawny Asal, MD 06/08/2017, 11:34 AM Pager: [phone]

## 2017-06-08 NOTE — Progress Notes (Signed)
Internal Medicine Attending:   I saw and examined the patient. I reviewed the resident's note and I agree with the resident's findings and plan as documented in the resident's note.  71 year old man hospital day 2 with acute on chronic end-stage systolic heart failure. He has responded well to high-dose diuretics over the first day, we will continue with same dose today for continued diuresis. Oxygenation was worse this morning, 86% on 6L by nasal canula, may be some component of sinus congestion and mouth breathing, so we are trying a face mask. He does not have that much pulm edema, but does have elevated pulmonary pressures and bilateral pleural effusions. We can try a thoracentesis to improve respiratory status, but will need to improve his INR first with oral vitamin K today. Blood pressure has been stable, though he is prone to hypotension given right heart disease.

## 2017-06-08 NOTE — Progress Notes (Signed)
Pt O2  Stats in the ~80's, resp ~25 with [place] at 6 liters. Paged RT and MD came to bedside. Pt congested, asked for medication to break up nasal congestion. On venti mask at 8 liters 40% stats at 95% resp 14. I will continue to monitor the patient closely.   Saddie Benders RN

## 2017-06-08 NOTE — Care Management Note (Addendum)
Case Management Note  Patient Details  Name: Christian Garner MRN: [id number] Date of Birth: 29-Apr-1946  Subjective/Objective:  Pt presented for SOB, Increased weight gain and Lower extremity Cellulitis-Acute on Chronic CHF. Initiated on IV Lasix and IV antibiotic therapy.  PTA- pt is from home with wife and he also has support of daughter. Pt has DME 02 at home. PTA pt was active with [facility] for [place].  PT/ OT will need to be ordered for Recommendations.                           Action/Plan: CM will continue to monitor for any additional needs.   Expected Discharge Date:                  Expected Discharge Plan: Home with [facility].   In-House Referral:  NA  Discharge planning Services  CM Consult  Post Acute Care Choice:   NA Choice offered to:    NA  DME Arranged:   N/A DME Agency:   NA  HH Arranged:  RN, PT HH Agency:  [facility] (formerly [employer])  Status of Service:  Completed.  If discussed at [place] of Stay Meetings, dates discussed:    Additional Comments: 1546 06-10-17 Jacqlyn Krauss, RN,BSN [phone] PT recommendations for SNF- Pt is agreeable to SNF. Pt's first choice [name]. CSW following for disposition needs.   Bethena Roys, RN 06/08/2017, 2:46 PM

## 2017-06-08 NOTE — Progress Notes (Signed)
ANTICOAGULATION CONSULT NOTE  Pharmacy Consult for Heparin  Indication: atrial fibrillation  No Known Allergies  Patient Measurements: Height: 6\' 2"  (188 cm) Weight: 257 lb 11.2 oz (116.9 kg) IBW/kg (Calculated) : 82.2 Heparin Dosing Weight: 99.3kg   Vital Signs: Temp: 98.2 F (36.8 C) (08/28 0830) Temp Source: Oral (08/28 0830) BP: 105/55 (08/28 0830) Pulse Rate: 69 (08/28 0600)  Labs:  Recent Labs  06/07/17 1026 06/08/17 0218  HGB 9.1* 9.8*  HCT 29.8* 32.7*  PLT 245 280  LABPROT 50.5* 59.8*  INR 5.35* 6.61*  CREATININE 3.20* 2.74*    Estimated Creatinine Clearance: 33.6 mL/min (A) (by C-G formula based on SCr of 2.74 mg/dL (H)).   Medical History: Past Medical History:  Diagnosis Date  . CAD (coronary artery disease)   . Cellulitis and abscess of lower extremity   . CHF (congestive heart failure) (HCC)    EF 30%  . DM (diabetes mellitus) ([place])       Assessment: Patient presented to the ED with leg swelling and weakness.  The patient has a history of A. Fib and takes warfarin 3 mg daily prior to admission, pharmacy has been consulted to dose heparin as warfarin is held.  -INR= 6.61 w/ trend up (possiby due to HF)  Goal of Therapy:  Monitor platelets by anticoagulation protocol: Yes   Plan:  -hold start of heparin until INR less than 2  -continue to check daily INR   Hildred Laser, Pharm D 06/08/2017 9:07 AM

## 2017-06-08 NOTE — Evaluation (Signed)
Physical Therapy Evaluation Patient Details Name: Christian Garner MRN: [id number] DOB: 01-29-1946 Today's Date: 06/08/2017   History of Present Illness  Patient is a 71 y/o male who presents with worsened functional activity and left foot wound. 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 CXR-Moderate right and small left pleural effusions with adjacent with bibasilar opacities. PMH includes CHF, EF 30-35%, CAd with multiple stents, LE cellulitis.  Clinical Impression  Patient presents with generalized weakness, deconditioning, dyspnea on exertion, impaired balance and impaired mobility s/p above. Requires Mod A for bed mobility and transfer to chair with use of RW for support. Pt hypotensive upon standing.  Supine BP 104/52 Sitting BP post transfer 73/52 Sitting BP ~3 mins with LEs elevated 104/53 Pt using venti mask on 8L at 40% as he is a mouth breather and has lots of congestion. Sp02 ranged from mid 80s-90s. Recommend flutter valve to help with this. Pt needed assist at home more recently due to weakness; reports falls. Would benefit from ST SNF to maximize independence and mobility prior to return home.     Follow Up Recommendations SNF;Supervision for mobility/OOB    Equipment Recommendations  None recommended by PT    Recommendations for Other Services OT consult     Precautions / Restrictions Precautions Precautions: Fall Precaution Comments: watch 02 Restrictions Weight Bearing Restrictions: No      Mobility  Bed Mobility Overal bed mobility: Needs Assistance Bed Mobility: Supine to Sit     Supine to sit: Mod assist;HOB elevated     General bed mobility comments: Pt reaching for PT to assist with elevating trunk. + dizziness.  Transfers Overall transfer level: Needs assistance Equipment used: Rolling walker (2 wheeled) Transfers: Sit to/from Stand Sit to Stand: Min assist;From elevated surface         General transfer  comment: Assist to power to standing with therapist stabilizing RW as pt pulling up despite cues. Posterior lean.   Ambulation/Gait Ambulation/Gait assistance: Mod assist;+2 physical assistance Ambulation Distance (Feet): 3 Feet Assistive device: Rolling walker (2 wheeled) Gait Pattern/deviations: Step-to pattern;Trunk flexed Gait velocity: decreased   General Gait Details: Able to take a few steps to get to chair with assist for RW negotiation and balance due to posterior lean. Sp02 dropped to mid 80s on venti mask.  Stairs            Wheelchair Mobility    Modified Rankin (Stroke Patients Only)       Balance Overall balance assessment: Needs assistance Sitting-balance support: Feet supported;Bilateral upper extremity supported Sitting balance-Leahy Scale: Fair Sitting balance - Comments: ABle to sit statically with UE support.   Standing balance support: During functional activity;Bilateral upper extremity supported Standing balance-Leahy Scale: Poor Standing balance comment: Reliant on UE support on RW and Min A for balance.                              Pertinent Vitals/Pain Pain Assessment: No/denies pain    Home Living Family/patient expects to be discharged to:: Private residence Living Arrangements: Spouse/significant other Available Help at Discharge: Family;Available 24 hours/day Type of Home: House Home Access: Stairs to enter Entrance Stairs-Rails: Left Entrance Stairs-Number of Steps: 3 Home Layout: One level Home Equipment: Walker - 2 wheels;Wheelchair - manual      Prior Function Level of Independence: Needs assistance   Gait / Transfers Assistance Needed: Limited mobility for the last couple weeks; uses  RW more recently. Falls reported.   ADL's / Homemaking Assistance Needed: Able to start ADLs but gives out per pt report requiring assist from wife.         Hand Dominance   Dominant Hand: Right    Extremity/Trunk Assessment    Upper Extremity Assessment Upper Extremity Assessment: Defer to OT evaluation    Lower Extremity Assessment Lower Extremity Assessment: Generalized weakness       Communication   Communication: No difficulties  Cognition Arousal/Alertness: Awake/alert Behavior During Therapy: WFL for tasks assessed/performed Overall Cognitive Status: Within Functional Limits for tasks assessed                                        General Comments General comments (skin integrity, edema, etc.): Pt on venti mask on 8L on 40%. Pt with unproductive cough during session.     Exercises     Assessment/Plan    PT Assessment Patient needs continued PT services  PT Problem List Decreased strength;Decreased mobility;Decreased activity tolerance;Cardiopulmonary status limiting activity;Decreased balance       PT Treatment Interventions Therapeutic activities;Gait training;Therapeutic exercise;Patient/family education;Balance training;Functional mobility training;Stair training;DME instruction    PT Goals (Current goals can be found in the Care Plan section)  Acute Rehab PT Goals Patient Stated Goal: to get stronger  PT Goal Formulation: With patient Time For Goal Achievement: 06/22/17 Potential to Achieve Goals: Good    Frequency Min 3X/week   Barriers to discharge Decreased caregiver support;Inaccessible home environment      Co-evaluation               AM-PAC PT "6 Clicks" Daily Activity  Outcome Measure Difficulty turning over in bed (including adjusting bedclothes, sheets and blankets)?: Unable Difficulty moving from lying on back to sitting on the side of the bed? : Unable Difficulty sitting down on and standing up from a chair with arms (e.g., wheelchair, bedside commode, etc,.)?: Unable Help needed moving to and from a bed to chair (including a wheelchair)?: A Lot Help needed walking in hospital room?: A Lot Help needed climbing 3-5 steps with a railing? :  Total 6 Click Score: 8    End of Session Equipment Utilized During Treatment: Oxygen;Gait belt Activity Tolerance: Patient limited by fatigue Patient left: in chair;with call bell/phone within reach Nurse Communication: Mobility status;Other (comment) (low BP) PT Visit Diagnosis: Unsteadiness on feet (R26.81);Muscle weakness (generalized) (M62.81);Difficulty in walking, not elsewhere classified (R26.2)    Time: 7048-8891 PT Time Calculation (min) (ACUTE ONLY): 26 min   Charges:   PT Evaluation $PT Eval Moderate Complexity: 1 Mod PT Treatments $Therapeutic Activity: 8-22 mins   PT G Codes:        Wray Kearns, PT, DPT [phone]    Marguarite Arbour A Ameena Vesey 06/08/2017, 10:25 AM

## 2017-06-08 NOTE — Progress Notes (Signed)
Orthopedic Tech Progress Note Patient Details:  Christian Garner 1946/05/05 [id number]  Ortho Devices Type of Ortho Device: Louretta Parma boot Ortho Device/Splint Location: bilateral Ortho Device/Splint Interventions: Application   [name] 06/08/2017, 2:45 PM

## 2017-06-09 DIAGNOSIS — J90 Pleural effusion, not elsewhere classified: Secondary | ICD-10-CM

## 2017-06-09 DIAGNOSIS — I5043 Acute on chronic combined systolic (congestive) and diastolic (congestive) heart failure: Secondary | ICD-10-CM | POA: Diagnosis not present

## 2017-06-09 DIAGNOSIS — I5084 End stage heart failure: Secondary | ICD-10-CM

## 2017-06-09 LAB — BASIC METABOLIC PANEL
Anion gap: 10 (ref 5–15)
BUN: 55 mg/dL — AB (ref 6–20)
CO2: 33 mmol/L — ABNORMAL HIGH (ref 22–32)
CREATININE: 2.31 mg/dL — AB (ref 0.61–1.24)
Calcium: 8.3 mg/dL — ABNORMAL LOW (ref 8.9–10.3)
Chloride: 91 mmol/L — ABNORMAL LOW (ref 101–111)
GFR, EST AFRICAN AMERICAN: 31 mL/min — AB (ref >60)
GFR, EST NON AFRICAN AMERICAN: 27 mL/min — AB (ref >60)
Glucose, Bld: 134 mg/dL — ABNORMAL HIGH (ref 65–99)
Potassium: 4 mmol/L (ref 3.5–5.1)
SODIUM: 134 mmol/L — AB (ref 135–145)

## 2017-06-09 LAB — GLUCOSE, CAPILLARY
GLUCOSE-CAPILLARY: 187 mg/dL — AB (ref 65–99)
GLUCOSE-CAPILLARY: 184 mg/dL — AB (ref 65–99)
Glucose-Capillary: 138 mg/dL — ABNORMAL HIGH (ref 65–99)
Glucose-Capillary: 130 mg/dL — ABNORMAL HIGH (ref 65–99)

## 2017-06-09 LAB — PROTIME-INR
INR: 3.11
INR: 1.96
Prothrombin Time: 31.8 seconds — ABNORMAL HIGH (ref 11.4–15.2)
Prothrombin Time: 22.2 seconds — ABNORMAL HIGH (ref 11.4–15.2)

## 2017-06-09 LAB — MAGNESIUM: MAGNESIUM: 2.4 mg/dL (ref 1.7–2.4)

## 2017-06-09 LAB — LACTATE DEHYDROGENASE, PLEURAL OR PERITONEAL FLUID: LD, Fluid: 84 U/L — ABNORMAL HIGH (ref 3–23)

## 2017-06-09 LAB — BODY FLUID CELL COUNT WITH DIFFERENTIAL
Eos, Fluid: 0 %
Lymphs, Fluid: 20 %
Monocyte-Macrophage-Serous Fluid: 48 % — ABNORMAL LOW (ref 50–90)
Neutrophil Count, Fluid: 32 % — ABNORMAL HIGH (ref 0–25)
WBC FLUID: 645 uL (ref 0–1000)

## 2017-06-09 LAB — CBC
HCT: 31.5 % — ABNORMAL LOW (ref 39.0–52.0)
Hemoglobin: 9.6 g/dL — ABNORMAL LOW (ref 13.0–17.0)
MCH: 28.4 pg (ref 26.0–34.0)
MCHC: 30.5 g/dL (ref 30.0–36.0)
MCV: 93.2 fL (ref 78.0–100.0)
PLATELETS: 295 10*3/uL (ref 150–400)
RBC: 3.38 MIL/uL — ABNORMAL LOW (ref 4.22–5.81)
RDW: 18.1 % — AB (ref 11.5–15.5)
WBC: 17.6 10*3/uL — AB (ref 4.0–10.5)

## 2017-06-09 LAB — PROTEIN, PLEURAL OR PERITONEAL FLUID: TOTAL PROTEIN, FLUID: 3.8 g/dL

## 2017-06-09 LAB — PROTEIN, TOTAL: TOTAL PROTEIN: 6.5 g/dL (ref 6.5–8.1)

## 2017-06-09 LAB — LACTATE DEHYDROGENASE: LDH: 171 U/L (ref 98–192)

## 2017-06-09 MED ORDER — PHYTONADIONE 5 MG PO TABS
5.0000 mg | ORAL_TABLET | Freq: Once | ORAL | Status: AC
  Administered 2017-06-09: 5 mg via ORAL
  Filled 2017-06-09: qty 1

## 2017-06-09 MED ORDER — PHYTONADIONE 5 MG PO TABS
5.0000 mg | ORAL_TABLET | Freq: Once | ORAL | Status: DC
  Filled 2017-06-09: qty 1

## 2017-06-09 NOTE — Progress Notes (Signed)
Subjective: Overnight, pt had mild confusion upon awakening, he was able to quickly be redirected and reoriented to avoid delirium. Reports continued urine output, no weight available today.     Objective:  Vital signs in last 24 hours: Vitals:   06/08/17 1659 06/08/17 2230 06/09/17 0328 06/09/17 0500  BP: 114/64 (!) 103/50 (!) 103/48 (!) 107/47  Pulse:  75 62 61  Resp:  (!) 21 14 18   Temp: 98.9 F (37.2 C) 98.4 F (36.9 C) 98.5 F (36.9 C) 98 F (36.7 C)  TempSrc: Oral Oral Oral Oral  SpO2: 97% 99% 100% 100%  Weight:      Height:       Physical Exam  Constitutional:  Elderly gentleman sitting up in chair on [place]   HENT:  Head: Normocephalic and atraumatic.  Cardiovascular: Normal rate and regular rhythm.   Pulmonary/Chest: Effort normal. No respiratory distress.  Abdominal:  Obese abdomen  Musculoskeletal:  Bilateral leg wraps in place   Neurological: He is alert.  Skin: Skin is warm.     Assessment/Plan:  Acute on Chronic Combine Systolic and Diastolic HF,  Ischemic Cardiomyopathy  Pt with advanced heart failure, EF 30-35% with cor pulmonale, and unfortunately, limited options for further treatment. He presented with increased weight (est dry weight ~238 lbs), decreased function status, with chronic pleural effusions and evidence of volume overload, confirmed with POC [place] of IVC. Aggressive diuresis with Lasix + metolazone was initiated with caution to avoid decreasing his pre-load too abruptly with R sided failure. He understands his prognosis and will continue medical therapy until he feels his life is being prolonged without meaningful quality, goal is to return home. He has responded well with good urine output and reduction in weight. His respiratory status and symptoms may also improve with therapeutic thoracentesis if his pleural effusions do not decrease in size and INR allows.    --Continue IV Lasix 120 mg TID + Metolazone 5 mg  --BMP + Mg, Daily weights, strict  I/Os --Heart Failure Consult, appreciate their recommendations    Acute on Chronic Kidney Injury Pt presented with oliguria, Cr elevated to 3.2 (baseline ~1.3-1.5). In the past he has required CRRT. A foley was placed and he has had good urine output and improvement in his Cr with diuresis, consistent with a cardiorenal etiology of his worsening kidney function. Cr currently 2.31 --Continue diuresis, monitoring as above   Cellulitis of LLE Pt has a chronic wound to L 5th digit likely due to poor perfusion, no purulent discharge of bone involvement. He was started on IV Ceftriaxone. His erythema of his LEs seem to have a large component of venous stasis, though he did present with an elevated white count which has stayed stable. Will discontinue anti-biotics today and monitor.    Paroxysmal Atrial Fibrillation Pt has been in sinus rhythm during admission, is on home Amiodarone 200 mg BID and Coumadin. Supra-therapeutic INR on admission, currently holding home Coumadin with plan to start heparin when INR at 2. With potential for thoracentesis for symptomatic relief, oral vitamin K administered with improvement in INR, currently 3.11   --Daily PT/INR --Monitor for bleeding   Nasal Congestion  Pt reports blowing nose, feeling congested-on chronic home O2 2 L. Oxygenation via [place] improved with afrin and adding humidifier to wall O2.   --Afrin nasal spray  --O2 via [place] -> mask prn, wean as tolerated    Dispo: Anticipated discharge in approximately 2-3 day(s).   Tawny Asal, MD 06/09/2017, 11:22 AM  Pager: [phone]

## 2017-06-09 NOTE — Plan of Care (Signed)
Problem: Safety: Goal: Ability to remain free from injury will improve Outcome: Progressing Pt uses call light to ask staff for assistance

## 2017-06-09 NOTE — Progress Notes (Signed)
Spoke with Dr Trilby Drummer re: Patient's B/P 90'/30-40's and when I took a manual B/P it was 88/56. He had a thoracentesis earlier and they took off 1.5 liters and he's no 120 mg IV Lasix TID, will hold this evenings dose. Patient has no complaints except for feeling tired, also made aware of foley drainage is pink-tinged with mucous sediment, will continue to monitor and may need to give a small amount of fluids at a later time, all other VSS.

## 2017-06-09 NOTE — Progress Notes (Signed)
Paged for intermittent confusion. Went to see patient. He states he wakes up confused, but it clears quickly. No complaints. Patient is easily redirectable. Vital signs stable. Alert and oriented x 3. Suspect sundowning.  Pearson Grippe, DO IM PGY-1 Pager: [phone]

## 2017-06-09 NOTE — Progress Notes (Signed)
Internal Medicine Attending:   I saw and examined the patient. I reviewed the resident's note and I agree with the resident's findings and plan as documented in the resident's note.  71 year old man with end-stage systolic right-sided heart failure and cor pulmonale admitted with volume overload. We're treating him palliatively, trying to diuresis and with the goal of him returning home with independent functional activity. He looks a lot better today than yesterday. He is sitting up in the chair this morning, 4 L by nasal cannula and saturating well. He is making good urine through a Foley catheter because of BPH and bladder outlet obstruction. I'm not sure why daily weights are not being recorded? Renal function is improving with diuresis, will continue with Lasix 120 mg 3 times a day and metolazone today. We talked about the large right-sided effusion that we saw on ultrasound on admission. I think draining this effusion might give him some palliative relief, might improve associated right-sided atelectasis and improve his oxygenation. Patient had a thoracentesis in June and tolerated it well. INR this morning is 3.1, we will give him another dose of vitamin K and recheck another INR later this afternoon, will do the thoracentesis at the bedside if we can get the INR below 2.

## 2017-06-09 NOTE — Progress Notes (Signed)
Advanced Heart Failure Rounding Note   Primary Cardiologist: Dr Haroldine Laws  Subjective:     Admitted with volume overload and fatigue. Found to have marked AKI on admission labs.   Diuresing with IV lasix.  Weight down 3 pounds. Creatinine 3.2>2.7 >2.3 .    Feeling better. SOB with exertion. Unable to stand due to leg weakness.    Objective:   Weight Range: 257 lb 11.2 oz (116.9 kg) Body mass index is 33.09 kg/m.   Vital Signs:   Temp:  [98 F (36.7 C)-98.9 F (37.2 C)] 98 F (36.7 C) (08/29 0500) Pulse Rate:  [61-75] 61 (08/29 0500) Resp:  [14-21] 18 (08/29 0500) BP: (103-114)/(47-64) 107/47 (08/29 0500) SpO2:  [97 %-100 %] 100 % (08/29 0500) FiO2 (%):  [40 %] 40 % (08/28 1205) Last BM Date: 06/06/17  Weight change: Filed Weights   06/07/17 0937 06/07/17 2100  Weight: 260 lb (117.9 kg) 257 lb 11.2 oz (116.9 kg)    Intake/Output:   Intake/Output Summary (Last 24 hours) at 06/09/17 0930 Last data filed at 06/09/17 0300  Gross per 24 hour  Intake             1624 ml  Output             1600 ml  Net               24 ml      Physical Exam    General:  Chronically ill appearing.  No resp difficulty. Sitting in the chair.  HEENT: Normal Neck: Supple. JVP to jaw  . Carotids 2+ bilat; no bruits. No lymphadenopathy or thyromegaly appreciated. Cor: PMI nondisplaced. Regular rate & rhythm. No rubs, gallops or murmurs. Lungs: RML RLL diminished.  Abdomen: Soft, nontender, nondistended. No hepatosplenomegaly. No bruits or masses. Good bowel sounds. Extremities: No cyanosis, clubbing, rash, R and LLE 2-3+ edmea. Leg wraps in place.  Neuro: Alert & orientedx3, cranial nerves grossly intact. moves all 4 extremities w/o difficulty. Affect pleasant   Telemetry  NSR 60-70s personally reviewed.   Labs    CBC  Recent Labs  06/08/17 0218 06/09/17 0234  WBC 16.6* 17.6*  HGB 9.8* 9.6*  HCT 32.7* 31.5*  MCV 94.8 93.2  PLT 280 315   Basic Metabolic  Panel  Recent Labs  06/08/17 0218 06/09/17 0234  NA 134* 134*  K 5.1 4.0  CL 95* 91*  CO2 30 33*  GLUCOSE 97 134*  BUN 62* 55*  CREATININE 2.74* 2.31*  CALCIUM 8.3* 8.3*  MG 2.6* 2.4   Liver Function Tests  Recent Labs  06/07/17 1026 06/08/17 0218  AST 20 16  ALT 22 21  ALKPHOS 88 82  BILITOT 0.7 0.9  PROT 6.5 6.5  ALBUMIN 2.3* 2.1*   No results for input(s): LIPASE, AMYLASE in the last 72 hours. Cardiac Enzymes No results for input(s): CKTOTAL, CKMB, CKMBINDEX, TROPONINI in the last 72 hours.  BNP: BNP (last 3 results)  Recent Labs  12/29/16 0355 04/12/17 1440 06/07/17 1026  BNP 236.2* 1,244.8* 920.5*    ProBNP (last 3 results) No results for input(s): PROBNP in the last 8760 hours.   D-Dimer No results for input(s): DDIMER in the last 72 hours. Hemoglobin A1C No results for input(s): HGBA1C in the last 72 hours. Fasting Lipid Panel No results for input(s): CHOL, HDL, LDLCALC, TRIG, CHOLHDL, LDLDIRECT in the last 72 hours. Thyroid Function Tests No results for input(s): TSH, T4TOTAL, T3FREE, THYROIDAB in the last 72  hours.  Invalid input(s): FREET3  Other results:   Imaging     No results found.   Medications:     Scheduled Medications: . amiodarone  200 mg Oral BID  . atorvastatin  40 mg Oral QHS  . chlorhexidine  15 mL Mouth Rinse BID  . ferrous sulfate  325 mg Oral Q breakfast  . insulin aspart  0-9 Units Subcutaneous TID WC  . mouth rinse  15 mL Mouth Rinse q12n4p  . metolazone  5 mg Oral Daily  . mupirocin cream   Topical Daily  . oxymetazoline  1 spray Each Nare BID  . sodium chloride flush  3 mL Intravenous Q12H  . vitamin B-12  1,000 mcg Oral Daily     Infusions: . cefTRIAXone (ROCEPHIN)  IV Stopped (06/08/17 1522)  . furosemide Stopped (06/08/17 2314)     PRN Medications:  acetaminophen **OR** acetaminophen, calcium carbonate, fluticasone    Patient Profile   Christian Garner is a 71 y.o. male with h/o  chronic systolic CHF, Chronic respiratory failure on home 02, CAD s/p DES to LAD in 03/2016, CKD, and DM.   Admitted with volume overload and fatigue. Found to have marked AKI on admission labs.   Assessment/Plan    1. End Stage A/C systolic CHF: ICM, LHC in June 2017 at [facility] with DES to LAD. Also with DES to RCA in 2014 Echo 03/2017 LVEF 30-35%, severe LVH, mild/mod MR, severe LAE, Moderate RV, Moderate RAE.   - NYHA class IIIb symptoms.  - Remains volume overloaded. Continue IV lasix . Renal function improving.  - No ACE/ARB/ARNI with CKD. He had hyperkalemia with losartan.  - With advanced disease and co-morbidities, pt is not candidate for advanced therapies.  2. PAF - Maintaining NSR.  - Continue amio 200 mg BID.  - CHA2DS2-VASc is at least 4.  - Continue warfarin for anticoagulation  3. Acute on chronic respiratory failure - Sats diminished. Likely multifactorial. Improved on venti mask - Continue to diurese. If no improvement may need to consider repeat thoracentesis with moderate pleural effusion on R.   4. AKI on CKD stage III (baseline 1.3 - 1.5) - Creatinine trending down. Watch closely.  - He is not candidate for HD.   5. History of R pleural Effusion - S/P Thoracentesis in 03/2017 -Primary team requesting thoracentesis. Continue to diurese.   6. CAD- Multivessel CAD - s/p DES LAD 6/17, mod stenosis circumflex. - Continue ASA - No S/S ischemia    7. Deconditioning - PT following.   Palliative Care consult pending. Needs Hospice.    Length of Stay: 2   Darrick Grinder, NP  06/09/2017, 9:30 AM  Advanced Heart Failure Team Pager [phone] (M-F; 7a - 4p)  Please contact [facility] for night-coverage after hours (4p -7a ) and weekends on [email or web address]  Patient seen and examined with Darrick Grinder, NP. We discussed all aspects of the encounter. I agree with the assessment and plan as stated above.   Remains markedly volume overloaded. Improving slowly with IV  diuresis.  Remains very weak. Likely low output.  Maintaining NSR on amio. Continue warfarin. Right pleural effusion has recurred. Will plan thoracenetesis.   Agree with Palliative Care consult to reassess [place].  Total time spent 35 minutes. Over half that time spent discussing above.   Glori Bickers, MD  3:04 PM

## 2017-06-09 NOTE — NC FL2 (Signed)
[place] LEVEL OF CARE SCREENING TOOL     IDENTIFICATION  Patient Name: Christian Garner Birthdate: 1945/10/24 Sex: male Admission Date (Current Location): 06/07/2017  [facility] and [place] Number:  [employer] and Address:  [facility], [place] [address] [place], [place] [address]      Provider Number: [id number]  Attending Physician Name and Address:  Axel Filler, *  Relative Name and Phone Number:       Current Level of Care: Hospital Recommended Level of Care: [place] Prior Approval Number:    Date Approved/Denied:   PASRR Number: [id number]  Discharge Plan: SNF    Current Diagnoses: Patient Active Problem List   Diagnosis Date Noted  . HFrEF (heart failure with reduced ejection fraction) ([place]) 06/07/2017  . CAD (coronary artery disease) 06/07/2017  . Pleural effusion, right   . S/P thoracentesis   . Acute renal failure with acute tubular necrosis superimposed on stage 2 chronic kidney disease ([place])   . Cellulitis of left lower extremity   . Goals of care, counseling/discussion   . Palliative care encounter   . Hyponatremia   . Pressure injury of skin 03/20/2017  . Acute on chronic combined systolic and diastolic CHF (congestive heart failure) ([place]) 03/19/2017  . Acute on chronic respiratory failure with hypoxia and hypercapnia ([place]) 03/19/2017  . AF (paroxysmal atrial fibrillation) ([place]) 03/19/2017  . Community acquired pneumonia 03/19/2017  . Pleural effusion   . AKI (acute kidney injury) ([place])     Orientation RESPIRATION BLADDER Height & Weight     Self, Time, Situation, Place  O2 (4L ) Incontinent, Indwelling catheter Weight: 257 lb 11.2 oz (116.9 kg) Height:  6\' 2"  (188 cm)  BEHAVIORAL SYMPTOMS/MOOD NEUROLOGICAL BOWEL NUTRITION STATUS      Continent Diet (carb modified)  AMBULATORY STATUS COMMUNICATION OF NEEDS Skin   Extensive Assist Verbally Other (Comment) (diabetic foot  ulcer with foam dressing, diabetic toe ulcer with gauze and medication)                       Personal Care Assistance Level of Assistance  Bathing, Dressing Bathing Assistance: Limited assistance   Dressing Assistance: Limited assistance     Functional Limitations Info             [place]  PT (By licensed PT), OT (By licensed OT)     PT Frequency: 5/wk OT Frequency: 5/wk            Contractures      Additional Factors Info  Code Status, Allergies, Insulin Sliding Scale Code Status Info: DNR Allergies Info: NKA   Insulin Sliding Scale Info: 3/day       Current Medications (06/09/2017):  This is the current hospital active medication list Current Facility-Administered Medications  Medication Dose Route Frequency Provider Last Rate Last Dose  . acetaminophen (TYLENOL) tablet 650 mg  650 mg Oral Q6H PRN Zada Finders, MD       Or  . acetaminophen (TYLENOL) suppository 650 mg  650 mg Rectal Q6H PRN Zada Finders, MD      . amiodarone (PACERONE) tablet 200 mg  200 mg Oral BID Zada Finders, MD   200 mg at 06/09/17 0913  . atorvastatin (LIPITOR) tablet 40 mg  40 mg Oral QHS Zada Finders, MD   40 mg at 06/08/17 2213  . calcium carbonate (TUMS - dosed in mg elemental calcium) chewable tablet 200  mg of elemental calcium  1 tablet Oral Daily PRN Zada Finders, MD      . cefTRIAXone (ROCEPHIN) 2 g in dextrose 5 % 50 mL IVPB  2 g Intravenous Q24H Zada Finders, MD   Stopped at 06/08/17 1522  . chlorhexidine (PERIDEX) 0.12 % solution 15 mL  15 mL Mouth Rinse BID Axel Filler, MD   15 mL at 06/09/17 0913  . ferrous sulfate tablet 325 mg  325 mg Oral Q breakfast Zada Finders, MD   325 mg at 06/09/17 0913  . fluticasone (FLONASE) 50 MCG/ACT nasal spray 1 spray  1 spray Each Nare Daily PRN Zada Finders, MD      . furosemide (LASIX) 120 mg in dextrose 5 % 50 mL IVPB  120 mg Intravenous TID Zada Finders, MD   Stopped at 06/08/17 2314  .  insulin aspart (novoLOG) injection 0-9 Units  0-9 Units Subcutaneous TID WC Zada Finders, MD   1 Units at 06/09/17 0912  . MEDLINE mouth rinse  15 mL Mouth Rinse q12n4p Axel Filler, MD   15 mL at 06/08/17 1523  . metolazone (ZAROXOLYN) tablet 5 mg  5 mg Oral Daily Zada Finders, MD   5 mg at 06/08/17 0858  . mupirocin cream (BACTROBAN) 2 %   Topical Daily Axel Filler, MD      . oxymetazoline (AFRIN) 0.05 % nasal spray 1 spray  1 spray Each Nare BID Zada Finders, MD   1 spray at 06/09/17 0913  . sodium chloride flush (NS) 0.9 % injection 3 mL  3 mL Intravenous Q12H Zada Finders, MD   3 mL at 06/08/17 2215  . vitamin B-12 (CYANOCOBALAMIN) tablet 1,000 mcg  1,000 mcg Oral Daily Zada Finders, MD   1,000 mcg at 06/09/17 2426     Discharge Medications: Please see discharge summary for a list of discharge medications.  Relevant Imaging Results:  Relevant Lab Results:   Additional Information SSN: [id number]  [place], LCSW

## 2017-06-09 NOTE — Clinical Social Work Note (Signed)
Clinical Social Work Assessment  Patient Details  Name: Christian Garner MRN: [id number] Date of Birth: July 18, 1946  Date of referral:  06/09/17               Reason for consult:  Facility Placement                Permission sought to share information with:  Family Supports Permission granted to share information::  Yes, Verbal Permission Granted  Name::     Darrio Bade  Agency::     Relationship::  spouse  Contact Information:  [phone]   Housing/Transportation Living arrangements for the past 2 months:  Single Family Home Source of Information:  Patient Patient Interpreter Needed:  None Criminal Activity/Legal Involvement Pertinent to Current Situation/Hospitalization:  No - Comment as needed Significant Relationships:  Adult Children, Spouse Lives with:  Spouse Do you feel safe going back to the place where you live?  Yes Need for family participation in patient care:  No (Coment)  Care giving concerns: Patient from home with wife. PT recommending SNF. Palliative consult also pending.   Social Worker assessment / plan: CSW met with patient at bedside. Patient sitting up in chair in hunched over position; patient reported this is because of "fluid on my lungs." Patient alert and oriented and with pleasant affect. Patient discussed what brought him into hospital and indicated his family would be up to visit him around 5 pm today. Patient reports he was able to manage pretty independently with some support from his wife up until about a month ago, when he started feeling more sick. CSW to continue to follow pending palliative consult and support with disposition planning.  Employment status:  Retired Forensic scientist:  Medicare PT Recommendations:  [place] / Referral to community resources:     Patient/Family's Response to care: Patient appreciative of care.  Patient/Family's Understanding of and Emotional Response to Diagnosis, Current  Treatment, and Prognosis: Patient with good understanding of his condition. Response to prognosis not discussed at this time. CSW will continue to follow for disposition planning after palliative consult.  Emotional Assessment Appearance:  Appears stated age Attitude/Demeanor/Rapport:  Other (appropriate) Affect (typically observed):  Pleasant Orientation:  Oriented to Self, Oriented to Place, Oriented to  Time, Oriented to Situation Alcohol / Substance use:  Not Applicable Psych involvement (Current and /or in the community):  No (Comment)  Discharge Needs  Concerns to be addressed:  Discharge Planning Concerns Readmission within the last 30 days:  Yes Current discharge risk:  Physical Impairment Barriers to Discharge:  Continued Medical Work up   Estanislado Emms, LCSW 06/09/2017, 4:08 PM

## 2017-06-09 NOTE — Progress Notes (Signed)
Pt having intermittent confusion/restlessness throughout night. Notified MD on call. MD saw pt at bedside.

## 2017-06-09 NOTE — Progress Notes (Signed)
ANTICOAGULATION CONSULT NOTE  Pharmacy Consult for Heparin  Indication: atrial fibrillation  No Known Allergies  Patient Measurements: Height: 6\' 2"  (188 cm) Weight: 257 lb 11.2 oz (116.9 kg) IBW/kg (Calculated) : 82.2 Heparin Dosing Weight: 99.3kg   Vital Signs: Temp: 98 F (36.7 C) (08/29 0500) Temp Source: Oral (08/29 0500) BP: 107/47 (08/29 0500) Pulse Rate: 61 (08/29 0500)  Labs:  Recent Labs  06/07/17 1026 06/08/17 0218 06/09/17 0234 06/09/17 1322  HGB 9.1* 9.8* 9.6*  --   HCT 29.8* 32.7* 31.5*  --   PLT 245 280 295  --   LABPROT 50.5* 59.8* 31.8* 22.2*  INR 5.35* 6.61* 3.11 1.96  CREATININE 3.20* 2.74* 2.31*  --     Estimated Creatinine Clearance: 39.9 mL/min (A) (by C-G formula based on SCr of 2.31 mg/dL (H)).   Medical History: Past Medical History:  Diagnosis Date  . CAD (coronary artery disease)   . Cellulitis and abscess of lower extremity   . CHF (congestive heart failure) (HCC)    EF 30%  . DM (diabetes mellitus) ([place])       Assessment: Patient presented to the ED with leg swelling and weakness.  The patient has a history of A. Fib and takes warfarin 3 mg daily prior to admission, pharmacy has been consulted to dose heparin as warfarin is held.  He is now s/p thoracentesis -INR= 1.96 (s/p vitamin K; 5mg  8/28 and 5mg  8/29)  Spoke with MD: continue to hold heparin due to recent procedure and no coumadin now.  Plans to reassess in am.  Goal of Therapy:  Monitor platelets by anticoagulation protocol: Yes   Plan:  -hold heparin -no coumadin now -continue to check daily INR   Hildred Laser, Pharm D 06/09/2017 4:30 PM

## 2017-06-09 NOTE — Plan of Care (Signed)
Problem: Physical Regulation: Goal: Ability to maintain clinical measurements within normal limits will improve Outcome: Progressing Pt had intermittent confusion throughout night. Restless, wakeing up from sleep multiple times. MD made aware. Pt easily reoriented. Remained on 4L [place] throughout shift. VSS, Denies any pain or SOB. LE wrapped w/ Una boots.  Will continue to monitor.

## 2017-06-09 NOTE — Procedures (Signed)
Thoracentesis Procedure Note  Pre-operative Diagnosis:  R Pleural Effusion  Indications:  Symptomatic relief of R Pleural Effusion  Procedure Details:  Informed consent was obtained after explanation of the risks and benefits of the procedure, refer to the consent documentation.  Time-out Performed immediately prior to the procedure.  All available chest radiographs were reviewed and the patient was subsequently placed in a sitting/semi-recumbent position.  Using ultrasound guidance a large pleural effusion was noted on the right.  The area was prepped with chlorhexadine and draped in sterile fashion.  Following this 10 mL of 1% lidocaine was injected subcutaneously and deep to provide anesthesia.  A small incision was then made parallel and superior to the rib, the thoracentesis needle with catheter was inserted into the chest wall and advanced under constant aspiration.  Upon aspiration of pleural fluid, the catheter was advanced into the pleural space and the needle was removed.  The catheter was then connected to a drainange bag and the fluid was removed under manual drainage. The catheter was then removed during slow forced exhalation and a sterile bandage was placed.  Findings:   1400 mL of straw colored pleural fluid was removed.  Fluid was sent for Cell count, gram stain and culture, LDH, and protein.   Condition: The patient tolerated the procedure well and remains in the same condition as pre-procedure.  Complications: None; patient tolerated the procedure well.  Plan: The ultrasound was subsequently used to evaluate for pneumothorax and none was identified with presence of pleural sliding. A follow up chest radiograph can be obtained if there is a change in patient's respiratory status following the procedure.

## 2017-06-09 NOTE — Evaluation (Signed)
Occupational Therapy Evaluation Patient Details Name: Christian Garner MRN: [id number] DOB: Sep 28, 1946 Today's Date: 06/09/2017    History of Present Illness Patient is a 71 y/o male who presents with worsened functional activity and left foot wound. 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 CXR-Moderate right and small left pleural effusions with adjacent with bibasilar opacities. PMH includes CHF, EF 30-35%, CAd with multiple stents, LE cellulitis.   Clinical Impression   Pt is typically independent in self care and ambulates with a walker until he became more fatigued and then began sponge bathing with assist of his wife, particularly for lower body. Pt presents with decreased activity tolerance with 02 sats dropping to 77% on 4L 02, bumped up briefly to 5L and instructed in pursed lip breathing with rebound to mid 90s. Pt with generalized weakness and poor activity tolerance. Will follow acutely.    Follow Up Recommendations  SNF    Equipment Recommendations  3 in 1 bedside commode    Recommendations for Other Services       Precautions / Restrictions Precautions Precautions: Fall Precaution Comments: watch 02 Restrictions Weight Bearing Restrictions: No      Mobility Bed Mobility               General bed mobility comments: pt on BSC upon arrival  Transfers Overall transfer level: Needs assistance Equipment used: Rolling walker (2 wheeled) Transfers: Sit to/from Stand;Stand Pivot Transfers Sit to Stand: Min assist;+2 physical assistance Stand pivot transfers: +2 physical assistance;Min assist       General transfer comment: assist to rise and steady    Balance Overall balance assessment: Needs assistance Sitting-balance support: Feet supported;Bilateral upper extremity supported Sitting balance-Leahy Scale: Fair       Standing balance-Leahy Scale: Poor Standing balance comment: reliant on B UE support                           ADL either performed or assessed with clinical judgement   ADL Overall ADL's : Needs assistance/impaired Eating/Feeding: Independent;Sitting   Grooming: Wash/dry hands;Sitting;Set up   Upper Body Bathing: Minimal assistance;Sitting   Lower Body Bathing: Maximal assistance;Sit to/from stand   Upper Body Dressing : Sitting;Set up   Lower Body Dressing: Maximal assistance;Sit to/from stand   Toilet Transfer: Minimal assistance;+2 for safety/equipment;Stand-pivot;BSC   Toileting- Clothing Manipulation and Hygiene: Total assistance;Sit to/from stand               Vision Patient Visual Report: No change from baseline       Perception     Praxis      Pertinent Vitals/Pain Pain Assessment: No/denies pain     Hand Dominance Left   Extremity/Trunk Assessment Upper Extremity Assessment Upper Extremity Assessment: Overall WFL for tasks assessed   Lower Extremity Assessment Lower Extremity Assessment: Defer to PT evaluation       Communication Communication Communication: HOH   Cognition Arousal/Alertness: Awake/alert Behavior During Therapy: WFL for tasks assessed/performed Overall Cognitive Status: Within Functional Limits for tasks assessed                                     General Comments       Exercises     Shoulder Instructions      Home Living Family/patient expects to be discharged to:: Private residence Living Arrangements: Spouse/significant other Available Help  at Discharge: Family;Available 24 hours/day Type of Home: House Home Access: Stairs to enter [employer] of Steps: 3 Entrance Stairs-Rails: Left Home Layout: One level     Bathroom Shower/Tub: Occupational psychologist: Standard     Home Equipment: Environmental consultant - 2 wheels;Wheelchair - manual          Prior Functioning/Environment Level of Independence: Needs assistance  Gait / Transfers Assistance Needed: Limited mobility  for the last couple weeks; uses RW more recently. Falls reported.  ADL's / Homemaking Assistance Needed: sponge bathing, assist for LB, dependent in IADL as SOB increased            OT Problem List: Decreased strength;Decreased activity tolerance;Impaired balance (sitting and/or standing);Decreased knowledge of use of DME or AE;Cardiopulmonary status limiting activity      OT Treatment/Interventions: Self-care/ADL training;DME and/or AE instruction;Patient/family education;Balance training;Therapeutic activities;Energy conservation    OT Goals(Current goals can be found in the care plan section) Acute Rehab OT Goals Patient Stated Goal: to get stronger  OT Goal Formulation: With patient Time For Goal Achievement: 05/19/17 Potential to Achieve Goals: Good ADL Goals Pt Will Perform Grooming: with supervision;standing Pt Will Perform Upper Body Dressing: (P) with modified independence;sitting Pt Will Perform Lower Body Dressing: with supervision;sit to/from stand Pt Will Transfer to Toilet: with supervision;ambulating Pt Will Perform Toileting - Clothing Manipulation and hygiene: with supervision;sit to/from stand Additional ADL Goal #1: Pt will utilize pursed lip breathing during exertion with minimal verbal cues.  OT Frequency: Min 2X/week   Barriers to D/C:            Co-evaluation              AM-PAC PT "6 Clicks" Daily Activity     Outcome Measure Help from another person eating meals?: None Help from another person taking care of personal grooming?: A Little Help from another person toileting, which includes using toliet, bedpan, or urinal?: Total Help from another person bathing (including washing, rinsing, drying)?: A Lot Help from another person to put on and taking off regular upper body clothing?: None Help from another person to put on and taking off regular lower body clothing?: A Lot 6 Click Score: 16   End of Session Equipment Utilized During Treatment:  Oxygen (4L)  Activity Tolerance: Patient tolerated treatment well Patient left: in chair;with call bell/phone within reach;with chair alarm set  OT Visit Diagnosis: Unsteadiness on feet (R26.81);Muscle weakness (generalized) (M62.81)                Time: 6834-1962 OT Time Calculation (min): 23 min Charges:  OT General Charges $OT Visit: 1 Visit OT Evaluation $OT Eval Moderate Complexity: 1 Mod OT Treatments $Self Care/Home Management : 8-22 mins G-Codes:     06-18-2017 Nestor Lewandowsky, OTR/L Pager: [phone] Werner Lean, [place] 2017/06/18, 1:37 PM

## 2017-06-10 LAB — BASIC METABOLIC PANEL
ANION GAP: 10 (ref 5–15)
BUN: 55 mg/dL — ABNORMAL HIGH (ref 6–20)
CALCIUM: 8.3 mg/dL — AB (ref 8.9–10.3)
CO2: 33 mmol/L — AB (ref 22–32)
Chloride: 92 mmol/L — ABNORMAL LOW (ref 101–111)
Creatinine, Ser: 1.88 mg/dL — ABNORMAL HIGH (ref 0.61–1.24)
GFR calc Af Amer: 40 mL/min — ABNORMAL LOW (ref >60)
GFR, EST NON AFRICAN AMERICAN: 34 mL/min — AB (ref >60)
Glucose, Bld: 120 mg/dL — ABNORMAL HIGH (ref 65–99)
POTASSIUM: 3.5 mmol/L (ref 3.5–5.1)
SODIUM: 135 mmol/L (ref 135–145)

## 2017-06-10 LAB — GLUCOSE, CAPILLARY
GLUCOSE-CAPILLARY: 121 mg/dL — AB (ref 65–99)
GLUCOSE-CAPILLARY: 169 mg/dL — AB (ref 65–99)
GLUCOSE-CAPILLARY: 174 mg/dL — AB (ref 65–99)
Glucose-Capillary: 151 mg/dL — ABNORMAL HIGH (ref 65–99)

## 2017-06-10 LAB — CBC
HCT: 32.3 % — ABNORMAL LOW (ref 39.0–52.0)
HEMOGLOBIN: 9.6 g/dL — AB (ref 13.0–17.0)
MCH: 27.7 pg (ref 26.0–34.0)
MCHC: 29.7 g/dL — ABNORMAL LOW (ref 30.0–36.0)
MCV: 93.1 fL (ref 78.0–100.0)
Platelets: 299 10*3/uL (ref 150–400)
RBC: 3.47 MIL/uL — AB (ref 4.22–5.81)
RDW: 17.8 % — ABNORMAL HIGH (ref 11.5–15.5)
WBC: 15.3 10*3/uL — ABNORMAL HIGH (ref 4.0–10.5)

## 2017-06-10 LAB — PROTIME-INR
INR: 1.57
PROTHROMBIN TIME: 18.6 s — AB (ref 11.4–15.2)

## 2017-06-10 LAB — PATHOLOGIST SMEAR REVIEW

## 2017-06-10 LAB — MAGNESIUM: MAGNESIUM: 2.1 mg/dL (ref 1.7–2.4)

## 2017-06-10 MED ORDER — WARFARIN SODIUM 2 MG PO TABS
2.0000 mg | ORAL_TABLET | Freq: Every day | ORAL | Status: DC
  Administered 2017-06-10 – 2017-06-11 (×2): 2 mg via ORAL
  Filled 2017-06-10 (×2): qty 1

## 2017-06-10 MED ORDER — WARFARIN SODIUM 2 MG PO TABS: 3.0000 mg | ORAL_TABLET | Freq: Every day | ORAL | Status: DC

## 2017-06-10 MED ORDER — WARFARIN - PHYSICIAN DOSING INPATIENT
Freq: Every day | Status: DC
Start: 2017-06-10 — End: 2017-06-12

## 2017-06-10 MED ORDER — POTASSIUM CHLORIDE CRYS ER 20 MEQ PO TBCR
40.0000 meq | EXTENDED_RELEASE_TABLET | Freq: Two times a day (BID) | ORAL | Status: DC
  Administered 2017-06-10 – 2017-06-12 (×5): 40 meq via ORAL
  Filled 2017-06-10 (×5): qty 2

## 2017-06-10 NOTE — Progress Notes (Signed)
Physical Therapy Treatment Patient Details Name: Christian Garner MRN: [id number] DOB: [birth year] Today's Date: 06/10/2017    History of Present Illness Patient is a 71 y/o male who presents with worsened functional activity and left foot wound. 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 CXR-Moderate right and small left pleural effusions with adjacent with bibasilar opacities. PMH includes CHF, EF 30-35%, CAd with multiple stents, LE cellulitis.    PT Comments    Pt making steady progress.   Follow Up Recommendations  SNF;Supervision for mobility/OOB     Equipment Recommendations  None recommended by PT    Recommendations for Other Services       Precautions / Restrictions Precautions Precautions: Fall Precaution Comments: watch 02 Restrictions Weight Bearing Restrictions: No    Mobility  Bed Mobility               General bed mobility comments: Pt sitting in chair  Transfers Overall transfer level: Needs assistance Equipment used: Rolling walker (2 wheeled) Transfers: Sit to/from Stand Sit to Stand: Min assist         General transfer comment: Verbal cues to scoot forward in chair and bring feet underneath himself. Assist to bring hips up.   Ambulation/Gait Ambulation/Gait assistance: Min assist Ambulation Distance (Feet): 7 Feet Assistive device: Rolling walker (2 wheeled) Gait Pattern/deviations: Step-to pattern;Decreased step length - right;Decreased step length - left Gait velocity: decr Gait velocity interpretation: Below normal speed for age/gender General Gait Details: Assist for balance and support. Pt on 3L of O2.   Stairs            Wheelchair Mobility    Modified Rankin (Stroke Patients Only)       Balance Overall balance assessment: Needs assistance Sitting-balance support: Feet supported;Bilateral upper extremity supported Sitting balance-Leahy Scale: Good     Standing balance support:  Bilateral upper extremity supported Standing balance-Leahy Scale: Poor Standing balance comment: UE support and supervision for static standing                            Cognition Arousal/Alertness: Awake/alert Behavior During Therapy: WFL for tasks assessed/performed Overall Cognitive Status: Within Functional Limits for tasks assessed                                        Exercises      General Comments        Pertinent Vitals/Pain Pain Assessment: No/denies pain    Home Living                      Prior Function            PT Goals (current goals can now be found in the care plan section) Progress towards PT goals: Progressing toward goals    Frequency    Min 3X/week      PT Plan Current plan remains appropriate    Co-evaluation              AM-PAC PT "6 Clicks" Daily Activity  Outcome Measure  Difficulty turning over in bed (including adjusting bedclothes, sheets and blankets)?: Unable Difficulty moving from lying on back to sitting on the side of the bed? : Unable Difficulty sitting down on and standing up from a chair with arms (e.g., wheelchair, bedside commode, etc,.)?: Unable Help needed  moving to and from a bed to chair (including a wheelchair)?: A Lot Help needed walking in hospital room?: A Lot Help needed climbing 3-5 steps with a railing? : Total 6 Click Score: 8    End of Session Equipment Utilized During Treatment: Oxygen;Gait belt Activity Tolerance: Patient limited by fatigue Patient left: in chair;with call bell/phone within reach;with chair alarm set Nurse Communication: Mobility status PT Visit Diagnosis: Unsteadiness on feet (R26.81);Muscle weakness (generalized) (M62.81);Difficulty in walking, not elsewhere classified (R26.2)     Time: 5449-2010 PT Time Calculation (min) (ACUTE ONLY): 32 min  Charges:  $Therapeutic Activity: 23-37 mins                    G Codes:       [facility] PT [place] 06/10/2017, 1:56 PM

## 2017-06-10 NOTE — Progress Notes (Signed)
Subjective: Pt had hypotensive blood pressure values overnight, no significant symptoms at the time. Lasix was held as a result. Today his weight is down to 250 lbs, has maintained good urine output. He tolerated thoracentesis well yesterday. He LLE fifth toe wound continues to appear necrotic-he was not aware of the extent of this and was surprised, counseled on expected course.    Objective:  Vital signs in last 24 hours: Vitals:   06/09/17 1630 06/09/17 2017 06/10/17 0001 06/10/17 0335  BP: (!) 110/40 (!) 97/32 (!) 120/48 (!) 111/56  Pulse:  64 66 65  Resp:   18 20  Temp: 98.4 F (36.9 C) 98.1 F (36.7 C) 98.1 F (36.7 C) 98 F (36.7 C)  TempSrc: Oral Oral Oral Oral  SpO2: 100% 100% 95% 99%  Weight:    250 lb 8 oz (113.6 kg)  Height:       Physical Exam  Constitutional:  Elderly gentleman sitting up in chair on [place]   HENT:  Head: Normocephalic and atraumatic.  Cardiovascular: Normal rate and regular rhythm.   Pulmonary/Chest: Effort normal. No respiratory distress.  Abdominal:  Obese abdomen  Musculoskeletal:  Bilateral leg wraps in place, L fifth toe necrotic with skin sloughing, black tissue underlying   Neurological: He is alert.  Skin: Skin is warm.     Assessment/Plan: Acute on Chronic Combine Systolic and Diastolic HF,  Ischemic Cardiomyopathy  Pt with advanced heart failure, EF 30-35% with cor pulmonale, and unfortunately, limited options for further treatment. He presented with increased weight (est dry weight ~238 lbs), decreased function status, with chronic pleural effusions and evidence of volume overload, confirmed with POC [place] of IVC. Aggressive diuresis with Lasix + metolazone was initiated with caution to avoid decreasing his pre-load too abruptly with R sided failure. He understands his prognosis and will continue medical therapy until he feels his life is being prolonged without meaningful quality, goal is to return home. He has responded well with good  urine output and reduction in weight. Once INR was <2,  R sided therapeutic thoracentesis was performed, removed 1.4 L without complications, labs consistent with transudative effusion and prior fluid analysis as anticipated. Lower systolic values expected with pre-load reduction, will monitor for symptoms and adjust diuresis accordingly.    --Continue IV Lasix 120 mg TID + Metolazone 5 mg  --BMP + Mg, Daily weights, strict I/Os --Heart Failure Consult, appreciate their recommendations    Acute on Chronic Kidney Injury Pt presented with oliguria, Cr elevated to 3.2 (baseline ~1.3-1.5). In the past he has required CRRT. A foley was placed and he has had good urine output and improvement in his Cr with diuresis, consistent with a cardiorenal etiology of his worsening kidney function. Cr currently 1.83.  --Continue diuresis, monitoring as above   Cellulitis of LLE, Necrotic L 5th Digit (LE) Pt has a chronic wound to L 5th digit likely due to poor perfusion, appears necrotic, pt unlikely to be a surgical candidate. Erythema of bilateral LEs L>R on presentation. He was started on IV Ceftriaxone. His erythema of his LEs seem to have a large component of venous stasis, though he did present with an elevated white count which has stayed stable. Anti-biotics were stopped 8/29, has remained stable. Will continue to monitor    Paroxysmal Atrial Fibrillation Pt has been in sinus rhythm during admission, is on home Amiodarone 200 mg BID and Coumadin. Supra-therapeutic INR on admission, currently holding home Coumadin with plan to start heparin when INR  at 2. In peri-procedure period, oral vitamin K was administered with improvement in INR. INR currently 1.57.  --Resume Warfarin at 2 mg daily    --Daily PT/INR --Monitor for bleeding   Nasal Congestion  Pt reports blowing nose, feeling congested-on chronic home O2 2 L. Oxygenation via [place] improved with afrin and adding humidifier to wall O2.   --Afrin nasal  spray  --O2 via [place] -> mask prn, wean as tolerated    Dispo: Anticipated discharge in approximately 2-3 day(s).   Tawny Asal, MD 06/10/2017, 6:38 AM Pager: [phone]

## 2017-06-10 NOTE — Progress Notes (Signed)
Advanced Heart Failure Rounding Note   Primary Cardiologist: Dr Haroldine Laws  Subjective:     Admitted with volume overload and fatigue. Found to have marked AKI on admission labs.   S/p thoracentesis yesterday - 1400 ml. Lasix held last night for hypotension.   Creatinine 3.20->2.74->2.31->1.88.  Objective:   Weight Range: 250 lb 8 oz (113.6 kg) Body mass index is 32.16 kg/m.   Vital Signs:   Temp:  [98 F (36.7 C)-98.4 F (36.9 C)] 98 F (36.7 C) (08/30 0335) Pulse Rate:  [64-66] 65 (08/30 0335) Resp:  [18-20] 20 (08/30 0335) BP: (97-120)/(32-56) 111/56 (08/30 0335) SpO2:  [95 %-100 %] 99 % (08/30 0335) Weight:  [250 lb 8 oz (113.6 kg)] 250 lb 8 oz (113.6 kg) (08/30 0335) Last BM Date: 06/06/17  Weight change: Filed Weights   06/07/17 9758 06/07/17 2100 06/10/17 0335  Weight: 260 lb (117.9 kg) 257 lb 11.2 oz (116.9 kg) 250 lb 8 oz (113.6 kg)    Intake/Output:   Intake/Output Summary (Last 24 hours) at 06/10/17 0630 Last data filed at 06/10/17 0002  Gross per 24 hour  Intake              240 ml  Output             3450 ml  Net            -3210 ml      Physical Exam    General:  Ill appearing male, NAD.  HEENT: Normal Neck:Supple,  JVP 9-10 cm. Carotids 2+ bilat; no bruits. No lymphadenopathy or thyromegaly appreciated. Cor: PMI non displaced. Regular rate and rhythm. No rubs, gallops or murmurs. Lungs:Clear in upper lobes, diminished in bases. No wheeze.  Abdomen:Obese, soft, + distended.  No hepatosplenomegaly. No bruits or masses. Good bowel sounds. Extremities: No cyanosis, clubbing, rash, 3+ edema to things, UNNA boots in place bilaterally.  Neuro: Alert and oriented x 3.  cranial nerves grossly intact. moves all 4 extremities w/o difficulty. Affect pleasant   Telemetry   NSR 60-70 bpm - personally reviewed.  Labs    CBC  Recent Labs  06/08/17 0218 06/09/17 0234  WBC 16.6* 17.6*  HGB 9.8* 9.6*  HCT 32.7* 31.5*  MCV 94.8 93.2  PLT 280  832   Basic Metabolic Panel  Recent Labs  06/08/17 0218 06/09/17 0234 06/10/17 0317  NA 134* 134* 135  K 5.1 4.0 3.5  CL 95* 91* 92*  CO2 30 33* 33*  GLUCOSE 97 134* 120*  BUN 62* 55* 55*  CREATININE 2.74* 2.31* 1.88*  CALCIUM 8.3* 8.3* 8.3*  MG 2.6* 2.4  --    Liver Function Tests  Recent Labs  06/07/17 1026 06/08/17 0218 06/09/17 0234  AST 20 16  --   ALT 22 21  --   ALKPHOS 88 82  --   BILITOT 0.7 0.9  --   PROT 6.5 6.5 6.5  ALBUMIN 2.3* 2.1*  --    No results for input(s): LIPASE, AMYLASE in the last 72 hours. Cardiac Enzymes No results for input(s): CKTOTAL, CKMB, CKMBINDEX, TROPONINI in the last 72 hours.  BNP: BNP (last 3 results)  Recent Labs  12/29/16 0355 04/12/17 1440 06/07/17 1026  BNP 236.2* 1,244.8* 920.5*    ProBNP (last 3 results) No results for input(s): PROBNP in the last 8760 hours.   D-Dimer No results for input(s): DDIMER in the last 72 hours. Hemoglobin A1C No results for input(s): HGBA1C in the last 72 hours.  Fasting Lipid Panel No results for input(s): CHOL, HDL, LDLCALC, TRIG, CHOLHDL, LDLDIRECT in the last 72 hours. Thyroid Function Tests No results for input(s): TSH, T4TOTAL, T3FREE, THYROIDAB in the last 72 hours.  Invalid input(s): FREET3  Other results:   Imaging    No results found.   Medications:     Scheduled Medications: . amiodarone  200 mg Oral BID  . atorvastatin  40 mg Oral QHS  . chlorhexidine  15 mL Mouth Rinse BID  . ferrous sulfate  325 mg Oral Q breakfast  . insulin aspart  0-9 Units Subcutaneous TID WC  . mouth rinse  15 mL Mouth Rinse q12n4p  . metolazone  5 mg Oral Daily  . mupirocin cream   Topical Daily  . oxymetazoline  1 spray Each Nare BID  . phytonadione  5 mg Oral Once  . sodium chloride flush  3 mL Intravenous Q12H  . vitamin B-12  1,000 mcg Oral Daily    Infusions: . furosemide 120 mg (06/09/17 1610)    PRN Medications: acetaminophen **OR** acetaminophen, calcium  carbonate, fluticasone    Patient Profile   Christian Garner is a 71 y.o. male with h/o chronic systolic CHF, Chronic respiratory failure on home 02, CAD s/p DES to LAD in 03/2016, CKD, and DM.   Admitted with volume overload and fatigue. Found to have marked AKI on admission labs.   Assessment/Plan    1. End Stage A/C systolic CHF: ICM, LHC in June 2017 at [facility] with DES to LAD. Also with DES to RCA in 2014 Echo 03/2017 LVEF 30-35%, severe LVH, mild/mod MR, severe LAE, Moderate RV, Moderate RAE.   - NYHA IIIb - Weight down 7 pounds over night, 10 pounds total.  - Continue IV lasix today.  - No losartan with history of hyperkalemia.   2. PAF - In NSR - Continue Amiodarone  - Continue warfarin for anticoagulation.   3. Acute on chronic respiratory failure - Improved with thoracentesis.   4. AKI on CKD stage III (baseline 1.3 - 1.5) -- Creatinine improved.   5. History of R pleural Effusion - s/p thoracentesis with 1400 ml out.   6. CAD- Multivessel CAD - s/p DES LAD 6/17, mod stenosis circumflex. - No chest pain - Continue ASA.   7. Deconditioning - PT following.   Palliative care consult pending.    Length of Stay: [place], NP  06/10/2017, 6:30 AM  Advanced Heart Failure Team Pager [phone] (M-F; 7a - 4p)  Please contact [facility] for night-coverage after hours (4p -7a ) and weekends on [email or web address]  Patient seen and examined with Jettie Booze, NP. We discussed all aspects of the encounter. I agree with the assessment and plan as stated above.   Volume status and renal function improving. Breathing better after thoracentesis. Remains volume overloaded. Continue VI diuresis. CAD stable without angina. Remains in NSR. Await Palliative Consult for [place]. (was recently in hospital for cardiogenic shock requiring inotropes).   Glori Bickers, MD  12:54 PM

## 2017-06-10 NOTE — Progress Notes (Signed)
Internal Medicine Attending:   I saw and examined the patient. I reviewed the resident's note and I agree with the resident's findings and plan as documented in the resident's note.  71 year old man with end-stage heart failure admitted with volume overload responding well to diuresis. Weight is down 10 pounds since admission, he tolerated bedside right thoracentesis well yesterday. Still requiring 4 L nasal cannula supplement oxygen today. We asked if he would talk with our palliative care team, and he said "I don't want to hear another word about end of life care." He has clear goals in mind, he wants to get enough volume off to go to rehab, and then return to home and have independent functioning. I am not sure this is realistic, but we will do our best.   His left fifth toe is having progressive gangrenous changes. The skin has sloughed off revealing a totally black toe. This is probably ischemic from PAD and hypotension, less likely embolic given he is anticoagulated on coumadin at home, and supratherapeutic. I do not think he can be medically cleared for any kind of non-emergent surgery given his advanced heart failure and difficult volume status. Unfortunately I think this toe will have to auto-amputate. It does not look infected today, we will reconsult wound care given the new sloughing. I don't think ABIs will change his management as he is not a candidate for revascularization procedures.

## 2017-06-10 NOTE — Consult Note (Addendum)
WOC consult requested for left 5th toe.  This has already been performed; refer to progress notes on 8/27 for assessment and measurements, topical treatment orders were provided for staff nurse use.  Pt could benefit from ABI to assess perfusion to left foot, please order if desired. Please re-consult if further assistance is needed.  Thank-you,  Julien Girt MSN, [place], [place], [place], [place]

## 2017-06-10 NOTE — Consult Note (Signed)
   [facility] CM Inpatient Consult   06/10/2017  Christian Garner 08-18-1946 [id number]   Patient assessed for multiple admissions .  Patient is a 71 year old with HX of HF exacerbation acute on chronic combined diastolic and systolic in the Medicare ACO.  Chart review reveals the patient will likely return to skilled nursing facility. No current [facility] Care management needs noted. For questions, please contact:  Natividad Brood, RN BSN [facility] Liaison  [phone] business mobile phone Toll free office [phone]

## 2017-06-10 NOTE — Plan of Care (Signed)
Problem: Physical Regulation: Goal: Ability to avoid or minimize complications of infection will improve Outcome: Progressing Blood cultures negative X2days  Problem: Skin Integrity: Goal: Skin integrity will improve Outcome: Not Progressing Left pinky toe dusky black, with little sensorium  Problem: Safety: Goal: Ability to remain free from injury will improve Outcome: Progressing Pt compliant with use of call light to ask for assistance from staff

## 2017-06-10 NOTE — Progress Notes (Signed)
CSW met with patient at bedside and discussed PT recommendation for SNF. Patient expressed significant motivation to engage in therapy and is agreeable to SNF. Patient stated goal of increasing his strength and mobility and eventually returning home to his wife. Patient was upset about his toe and reported MD had indicated left small toe may likely self amputate. Patient is concerned about discussing this with his wife. CSW validated patient's feelings and provided hope for recovery despite possible loss of toe.   CSW called patient's wife and discussed plan for SNF. Wife also agreeable. Wife discussed concerns about patient, reporting he had become confused at home, had several falls, and was unable to walk, often not making it to the bathroom from their bed. Patient and wife prefer [name], stating patient had been there in the past. CSW sent referral there and other area SNFs. CSW to continue to follow and support with discharge planning.  Christian Garner, [place]

## 2017-06-10 NOTE — Progress Notes (Signed)
Spoke with wound nurse about pts left 5th toe, dressing changed to xeroform with gauze covering.  Edward Qualia RN

## 2017-06-10 NOTE — Progress Notes (Signed)
Pts Left 5th toe dusky black, draining moderate amount of serosanguinous fluid, open crevice surrounding toe, dry gauze applied, MD aware, denies pain or discomfort ,sitting up in chair at present.  Edward Qualia RN

## 2017-06-10 NOTE — Discharge Instructions (Signed)
Information on my medicine - Coumadin   (Warfarin)  This medication education was reviewed with me or my healthcare representative as part of my discharge preparation.  The pharmacist that spoke with me during my hospital stay was:  Bridgett Larsson, [facility]  Why was Coumadin prescribed for you? Coumadin was prescribed for you because you have a blood clot or a medical condition that can cause an increased risk of forming blood clots. Blood clots can cause serious health problems by blocking the flow of blood to the heart, lung, or brain. Coumadin can prevent harmful blood clots from forming. As a reminder your indication for Coumadin is:   Stroke Prevention Because Of Atrial Fibrillation  What test will check on my response to Coumadin? While on Coumadin (warfarin) you will need to have an INR test regularly to ensure that your dose is keeping you in the desired range. The INR (international normalized ratio) number is calculated from the result of the laboratory test called prothrombin time (PT).  If an INR APPOINTMENT HAS NOT ALREADY BEEN MADE FOR YOU please schedule an appointment to have this lab work done by your health care provider within 7 days. Your INR goal is usually a number between:  2 to 3 or your provider may give you a more narrow range like 2-2.5.  Ask your health care provider during an office visit what your goal INR is.  What  do you need to  know  About  COUMADIN? Take Coumadin (warfarin) exactly as prescribed by your healthcare provider about the same time each day.  DO NOT stop taking without talking to the doctor who prescribed the medication.  Stopping without other blood clot prevention medication to take the place of Coumadin may increase your risk of developing a new clot or stroke.  Get refills before you run out.  What do you do if you miss a dose? If you miss a dose, take it as soon as you remember on the same day then continue your regularly scheduled regimen the next  day.  Do not take two doses of Coumadin at the same time.  Important Safety Information A possible side effect of Coumadin (Warfarin) is an increased risk of bleeding. You should call your healthcare provider right away if you experience any of the following: ? Bleeding from an injury or your nose that does not stop. ? Unusual colored urine (red or dark brown) or unusual colored stools (red or black). ? Unusual bruising for unknown reasons. ? A serious fall or if you hit your head (even if there is no bleeding).  Some foods or medicines interact with Coumadin (warfarin) and might alter your response to warfarin. To help avoid this: ? Eat a balanced diet, maintaining a consistent amount of Vitamin K. ? Notify your provider about major diet changes you plan to make. ? Avoid alcohol or limit your intake to 1 drink for women and 2 drinks for men per day. (1 drink is 5 oz. wine, 12 oz. beer, or 1.5 oz. liquor.)  Make sure that ANY health care provider who prescribes medication for you knows that you are taking Coumadin (warfarin).  Also make sure the healthcare provider who is monitoring your Coumadin knows when you have started a new medication including herbals and non-prescription products.  Coumadin (Warfarin)  Major Drug Interactions  Increased Warfarin Effect Decreased Warfarin Effect  Alcohol (large quantities) Antibiotics (esp. Septra/Bactrim, Flagyl, Cipro) Amiodarone (Cordarone) Aspirin (ASA) Cimetidine (Tagamet) Megestrol (Megace) NSAIDs (ibuprofen,  naproxen, etc.) °Piroxicam (Feldene) °Propafenone (Rythmol SR) °Propranolol (Inderal) °Isoniazid (INH) °Posaconazole (Noxafil) Barbiturates (Phenobarbital) °Carbamazepine (Tegretol) °Chlordiazepoxide (Librium) °Cholestyramine (Questran) °Griseofulvin °Oral Contraceptives °Rifampin °Sucralfate (Carafate) °Vitamin K  ° °Coumadin® (Warfarin) Major Herbal Interactions  °Increased Warfarin Effect Decreased Warfarin Effect   °Garlic °Ginseng °Ginkgo biloba Coenzyme Q10 °Green tea °St. John’s wort   ° °Coumadin® (Warfarin) FOOD Interactions  °Eat a consistent number of servings per week of foods HIGH in Vitamin K °(1 serving = ½ cup)  °Collards (cooked, or boiled & drained) °Kale (cooked, or boiled & drained) °Mustard greens (cooked, or boiled & drained) °Parsley *serving size only = ¼ cup °Spinach (cooked, or boiled & drained) °Swiss chard (cooked, or boiled & drained) °Turnip greens (cooked, or boiled & drained)  °Eat a consistent number of servings per week of foods MEDIUM-HIGH in Vitamin K °(1 serving = 1 cup)  °Asparagus (cooked, or boiled & drained) °Broccoli (cooked, boiled & drained, or raw & chopped) °Brussel sprouts (cooked, or boiled & drained) *serving size only = ½ cup °Lettuce, raw (green leaf, endive, romaine) °Spinach, raw °Turnip greens, raw & chopped  ° °These websites have more information on Coumadin (warfarin):  www.coumadin.com; °www.ahrq.gov/consumer/coumadin.htm; ° ° °

## 2017-06-11 LAB — PROTIME-INR
INR: 1.34
Prothrombin Time: 16.5 seconds — ABNORMAL HIGH (ref 11.4–15.2)

## 2017-06-11 LAB — CBC
HEMATOCRIT: 32.8 % — AB (ref 39.0–52.0)
Hemoglobin: 10 g/dL — ABNORMAL LOW (ref 13.0–17.0)
MCH: 28.1 pg (ref 26.0–34.0)
MCHC: 30.5 g/dL (ref 30.0–36.0)
MCV: 92.1 fL (ref 78.0–100.0)
PLATELETS: 337 10*3/uL (ref 150–400)
RBC: 3.56 MIL/uL — ABNORMAL LOW (ref 4.22–5.81)
RDW: 17.7 % — AB (ref 11.5–15.5)
WBC: 14.6 10*3/uL — ABNORMAL HIGH (ref 4.0–10.5)

## 2017-06-11 LAB — BASIC METABOLIC PANEL
Anion gap: 9 (ref 5–15)
BUN: 48 mg/dL — AB (ref 6–20)
CHLORIDE: 92 mmol/L — AB (ref 101–111)
CO2: 34 mmol/L — AB (ref 22–32)
CREATININE: 1.69 mg/dL — AB (ref 0.61–1.24)
Calcium: 8.4 mg/dL — ABNORMAL LOW (ref 8.9–10.3)
GFR calc Af Amer: 45 mL/min — ABNORMAL LOW (ref >60)
GFR calc non Af Amer: 39 mL/min — ABNORMAL LOW (ref >60)
GLUCOSE: 146 mg/dL — AB (ref 65–99)
POTASSIUM: 3.9 mmol/L (ref 3.5–5.1)
SODIUM: 135 mmol/L (ref 135–145)

## 2017-06-11 LAB — GLUCOSE, CAPILLARY
GLUCOSE-CAPILLARY: 184 mg/dL — AB (ref 65–99)
Glucose-Capillary: 135 mg/dL — ABNORMAL HIGH (ref 65–99)
Glucose-Capillary: 193 mg/dL — ABNORMAL HIGH (ref 65–99)
Glucose-Capillary: 161 mg/dL — ABNORMAL HIGH (ref 65–99)

## 2017-06-11 LAB — MAGNESIUM: Magnesium: 2.1 mg/dL (ref 1.7–2.4)

## 2017-06-11 MED ORDER — METOLAZONE 5 MG PO TABS
5.0000 mg | ORAL_TABLET | Freq: Every day | ORAL | Status: DC
  Administered 2017-06-12: 5 mg via ORAL
  Filled 2017-06-11 (×2): qty 1

## 2017-06-11 MED ORDER — SPIRONOLACTONE 25 MG PO TABS
12.5000 mg | ORAL_TABLET | Freq: Every day | ORAL | Status: DC
  Administered 2017-06-11 – 2017-06-12 (×2): 12.5 mg via ORAL
  Filled 2017-06-11 (×2): qty 1

## 2017-06-11 MED ORDER — METOLAZONE 5 MG PO TABS
5.0000 mg | ORAL_TABLET | Freq: Two times a day (BID) | ORAL | Status: DC
  Administered 2017-06-11: 5 mg via ORAL
  Filled 2017-06-11: qty 1

## 2017-06-11 NOTE — Consult Note (Signed)
[place] Nurse wound consult note Reason for Consult: Left 5th digit. Please note that patient consult has been placed three times in 5 days for same digit. Recommendations from my partner, D. Barbie Haggis have included ABI to assess perfusion status of right foot. See notes 8/27 and 8/30. Wound type: Consideration of mixed etiology, arterial and venous insufficiency, trauma (patient reports a recent fall) Pressure Injury POA: N/A Measurement:Left plantar foot with chronic full thickness wound surrounded by callous. Wound measures 1.5cm x 3cm x 0.2cm (Patient was to see MD at outpatient [facility], but did not make appointment due to hospitalization, see note on 8/27) 5th digit is dark purple, 2cm x 1cm area affected, digit is rotated laterally at rest, tissue is macerated, particularly in the interdigital space between the 4th and 5th digit. Drainage (amount, consistency, odor) scant serous Periwound:Macerated (see above) Dressing procedure/placement/frequency: Nursing staff report that patient has refused to elevate LEs while OOB in recliner chair and that he has been in a recliner chair all night. A pressure redistribution chair cushion is ordered for prevention of pressure injuries while OOB in chair. Today, I cut off the previously applied Unna's Boot to the left LE as the foot distal to the midfoot (where the Unna's Boot was initiated) is red and  edematous, the left 5th digit is ecchymotic and macerated. I will discontinue the previously ordered bactroban ointment in favor of a twice daily saline dressing and elevation. Replace Unna's boot today to right foot with initiation point closer to metatarsal heads. [place] nursing team will not follow, but will remain available to this patient, the nursing and medical teams.  Please re-consult if needed. Thanks, Maudie Flakes, MSN, RN, [place], Arther Abbott  Pager# [phone]

## 2017-06-11 NOTE — Progress Notes (Signed)
Subjective: No acute events overnight, pt reports several episodes of diarrhea he attributes to potassium tablet that was recently started. States it has upset his stomach in the past. Weight is down to 244 today.   Yesterday spoke with patient regarding end of life decisions and planning. He was adamant that he has made plans and does not want to discuss end of life decisions any further and declined to speak with palliative care. He is eager to work with rehab and be able to return home with his wife. Social work is assisting in finding a rehab facility for disposition.   Objective:  Vital signs in last 24 hours: Vitals:   06/10/17 1723 06/10/17 1900 06/11/17 0024 06/11/17 0423  BP: (!) 114/48 (!) 109/47 (!) 96/34 104/62  Pulse:  66 62 62  Resp:  17 19 17   Temp: 98.1 F (36.7 C) 98.2 F (36.8 C) 98.2 F (36.8 C) 99.3 F (37.4 C)  TempSrc: Oral Oral Oral Oral  SpO2: 96% 100% 99% 100%  Weight:      Height:       Physical Exam  Constitutional:  Elderly gentleman sitting up in chair on [place], eating breakfast   HENT:  Head: Normocephalic and atraumatic.  Cardiovascular: Normal rate and regular rhythm.   Pulmonary/Chest: Effort normal. No respiratory distress.  Abdominal:  Obese abdomen  Musculoskeletal:  Bilateral leg wraps in place, L fifth toe necrotic with skin sloughing, black tissue underlying   Neurological: He is alert.  Skin: Skin is warm.    Assessment/Plan: Acute on Chronic Combine Systolic and Diastolic HF,  Ischemic Cardiomyopathy  Pt with advanced heart failure, EF 30-35% with cor pulmonale, and unfortunately, limited options for further treatment. He presented with increased weight (est dry weight ~238 lbs), decreased function status, with chronic pleural effusions and evidence of volume overload, confirmed with POC [place] of IVC. Aggressive diuresis with Lasix + metolazone was initiated with caution to avoid decreasing his pre-load too abruptly with R sided failure.  He understands his prognosis and will continue medical therapy until he feels his life is being prolonged without meaningful quality, goal is to return home. He has responded well with good urine output and reduction in weight. Once INR was <2,  R sided therapeutic thoracentesis was performed, removed 1.4 L without complications, labs consistent with transudative effusion and prior fluid analysis as anticipated.     --Continue IV Lasix 120 mg TID and metolazone  --Spironolactone 12.5 mg daily  --BMP + Mg, Daily weights, strict I/Os --Heart Failure Consult, appreciate their recommendations     Acute on Chronic Kidney Injury Pt presented with oliguria, Cr elevated to 3.2 (baseline ~1.3-1.5). In the past he has required CRRT. A foley was placed and he has had good urine output and improvement in his Cr with diuresis, consistent with a cardiorenal etiology of his worsening kidney function. Cr currently 1.69, nearing his baseline. Will plan for voiding trial without foley catheter prior to discharge.  --Continue diuresis, monitoring as above   Cellulitis of LLE, Necrotic L 5th Digit (LE) Pt has a chronic wound to L 5th digit likely due to poor perfusion, appears necrotic, pt unlikely to be a surgical candidate. Erythema of bilateral LEs L>R on presentation. He was started on IV Ceftriaxone. His erythema of his LEs seem to have a large component of venous stasis, though he did present with an elevated white count which has stayed stable. Anti-biotics were stopped 8/29, has remained stable. Will continue to monitor  Paroxysmal Atrial Fibrillation Pt has been in sinus rhythm during admission, is on home Amiodarone 200 mg BID and Coumadin. Supra-therapeutic INR on admission, currently holding home Coumadin with plan to start heparin when INR at 2. In peri-procedure period, oral vitamin K was administered with improvement in INR. INR currently 1.34.  --Warfarin at 2 mg daily    --Daily PT/INR --Monitor  for bleeding   Nasal Congestion  Pt reports blowing nose, feeling congested-on chronic home O2 2 L. Oxygenation via [place] improved with afrin and adding humidifier to wall O2.   --Fluticasone nasal spray prn  --O2 via  -> mask prn, wean as tolerated    Dispo: Anticipated discharge in approximately 2-3 day(s).   Tawny Asal, MD 06/11/2017, 6:58 AM Pager: [phone]

## 2017-06-11 NOTE — Progress Notes (Addendum)
Spoke with Md who stated to keep the foley in for today and rounding MD will reasses in the morning. Pt's primary nurse aware who will cont to monitor pt.

## 2017-06-11 NOTE — Care Management Important Message (Signed)
Important Message  Patient Details  Name: Christian Garner MRN: [id number] Date of Birth: Nov 29, 1945   Medicare Important Message Given:  Yes    Nathen May 06/11/2017, 9:18 AM

## 2017-06-11 NOTE — Progress Notes (Signed)
Advanced Heart Failure Rounding Note   Primary Cardiologist: Dr Haroldine Laws  Subjective:     Admitted with volume overload and fatigue. Found to have marked AKI on admission labs.   S/p thoracentesis 8/29 - 1400 ml.   Says breathing is better but otherwise continue to perseverate and ask the same questions even after I answer them  "what does it look like, doc? So what are we going to do? When am I going home?"  I/O -2600 but weight unchanged at 250. Remains in NSR  Creatinine 3.20->2.74->2.31->1.88-> 1.69.  Objective:   Weight Range: 113.4 kg (250 lb) Body mass index is 32.1 kg/m.   Vital Signs:   Temp:  [97.8 F (36.6 C)-99.3 F (37.4 C)] 99.3 F (37.4 C) (08/31 0423) Pulse Rate:  [62-66] 62 (08/31 0423) Resp:  [17-19] 17 (08/31 0423) BP: (96-123)/(34-62) 104/62 (08/31 0423) SpO2:  [96 %-100 %] 100 % (08/31 0423) Weight:  [113.4 kg (250 lb)] 113.4 kg (250 lb) (08/30 1200) Last BM Date: 06/10/17  Weight change: Filed Weights   06/07/17 2100 06/10/17 0335 06/10/17 1200  Weight: 116.9 kg (257 lb 11.2 oz) 113.6 kg (250 lb 8 oz) 113.4 kg (250 lb)    Intake/Output:   Intake/Output Summary (Last 24 hours) at 06/11/17 0643 Last data filed at 06/11/17 0539  Gross per 24 hour  Intake              127 ml  Output             2727 ml  Net            -2600 ml      Physical Exam    General: Sitting in chair NAD. Agitated at times  HEENT: Normal anicteric Neck:Supple,  JVP jaw. Carotids 2+ bilat; no bruits. No lymphadenopathy or thyromegaly appreciated. Cor: RR 2/6 SEM LSB. +s3 Lungs:Decreased R base. Clear  Abdomen:Obese, soft, + distended. NT No hepatosplenomegaly. No bruits or masses. Good bowel sounds. Extremities: No cyanosis, clubbing, rash, 3+ edema to things, UNNA boots in place bilaterally.  Neuro: Alert and oriented x 3.  cranial nerves grossly intact. moves all 4 extremities w/o difficulty. Affect agiotated   Telemetry   NSR 70 bpm - personally  reviewed.   Labs    CBC  Recent Labs  06/10/17 0707 06/11/17 0251  WBC 15.3* 14.6*  HGB 9.6* 10.0*  HCT 32.3* 32.8*  MCV 93.1 92.1  PLT 299 761   Basic Metabolic Panel  Recent Labs  06/10/17 0317 06/10/17 0707 06/11/17 0251  NA 135  --  135  K 3.5  --  3.9  CL 92*  --  92*  CO2 33*  --  34*  GLUCOSE 120*  --  146*  BUN 55*  --  48*  CREATININE 1.88*  --  1.69*  CALCIUM 8.3*  --  8.4*  MG  --  2.1 2.1   Liver Function Tests  Recent Labs  06/09/17 0234  PROT 6.5   No results for input(s): LIPASE, AMYLASE in the last 72 hours. Cardiac Enzymes No results for input(s): CKTOTAL, CKMB, CKMBINDEX, TROPONINI in the last 72 hours.  BNP: BNP (last 3 results)  Recent Labs  12/29/16 0355 04/12/17 1440 06/07/17 1026  BNP 236.2* 1,244.8* 920.5*    ProBNP (last 3 results) No results for input(s): PROBNP in the last 8760 hours.   D-Dimer No results for input(s): DDIMER in the last 72 hours. Hemoglobin A1C No results for input(s): HGBA1C  in the last 72 hours. Fasting Lipid Panel No results for input(s): CHOL, HDL, LDLCALC, TRIG, CHOLHDL, LDLDIRECT in the last 72 hours. Thyroid Function Tests No results for input(s): TSH, T4TOTAL, T3FREE, THYROIDAB in the last 72 hours.  Invalid input(s): FREET3  Other results:   Imaging    No results found.   Medications:     Scheduled Medications: . amiodarone  200 mg Oral BID  . atorvastatin  40 mg Oral QHS  . chlorhexidine  15 mL Mouth Rinse BID  . ferrous sulfate  325 mg Oral Q breakfast  . insulin aspart  0-9 Units Subcutaneous TID WC  . mouth rinse  15 mL Mouth Rinse q12n4p  . metolazone  5 mg Oral Daily  . mupirocin cream   Topical Daily  . oxymetazoline  1 spray Each Nare BID  . potassium chloride  40 mEq Oral BID  . sodium chloride flush  3 mL Intravenous Q12H  . vitamin B-12  1,000 mcg Oral Daily  . warfarin  2 mg Oral q1800  . Warfarin - Physician Dosing Inpatient   Does not apply q1800     Infusions: . furosemide Stopped (06/10/17 2301)    PRN Medications: acetaminophen **OR** acetaminophen, calcium carbonate, fluticasone    Patient Profile   Christian Garner is a 71 y.o. male with h/o chronic systolic CHF, Chronic respiratory failure on home 02, CAD s/p DES to LAD in 03/2016, CKD, and DM.   Admitted with volume overload and fatigue. Found to have marked AKI on admission labs.   Assessment/Plan    1. End Stage A/C systolic CHF: ICM, LHC in June 2017 at [facility] with DES to LAD. Also with DES to RCA in 2014 Echo 03/2017 LVEF 30-35%, severe LVH, mild/mod MR, severe LAE, Moderate RV, Moderate RAE.   - NYHA IIIb - Weight down 10 pounds so far but now flat. Still with marked volume overload - Now on lasix 120 q8. Will continue. Increase metolazone to 5 bid. Watch K. Add spiro only while in hopsital. NOT on discharge - No losartan with history of hyperkalemia. No b-blocker with low output - unclear to me how much of this is truly end-stage versus advanced HF with poor compliance. During last admit was inotrope dependent so likely end-stage - not candidate for home inotropes or advanced therapies - Await Palliative Care assessment  2. PAF - In NSR - Continue Amiodarone  - Continue warfarin for anticoagulation. INR 1.3 D/w pharmD  3.  Acute on chronic respiratory failure - Improved with thoracentesis.   4. AKI on CKD stage III (baseline 1.3 - 1.5) -- Creatinine improving. Now almost back to baseline at 1.69  5. History of R pleural Effusion - s/p thoracentesis with 1400 ml out.   6. CAD- Multivessel CAD - s/p DES LAD 6/17, mod stenosis circumflex. - No s/s ischemia - Continue ASA.   7. Deconditioning - PT following.   Palliative care consult pending.    Length of Stay: 4   Glori Bickers, MD  06/11/2017, 6:43 AM  Advanced Heart Failure Team Pager [phone] (M-F; 7a - 4p)  Please contact [facility] for night-coverage after hours (4p  -7a ) and weekends on [email or web address]

## 2017-06-11 NOTE — Progress Notes (Signed)
Orthopedic Tech Progress Note Patient Details:  Christian Garner 03-Jun-1946 [id number]  Ortho Devices Type of Ortho Device: Unna boot Ortho Device/Splint Location: Because pt had dressing on left foot nurse removed unna boot wrap.  Ortho tech was requested to replace unna boot on left leg.  Unna boot on left leg was replaced and unna boot was replaced on right leg as well.  Bilateral Unna boot wrap. Ortho Device/Splint Interventions: Application   [name] 06/11/2017, 4:49 PM

## 2017-06-11 NOTE — Progress Notes (Signed)
Internal Medicine Attending:   I saw and examined the patient. I reviewed the resident's note and I agree with the resident's findings and plan as documented in the resident's note.  71 year old man admitted with acute on chronic systolic heart failure and volume overload. He is responded very well to diuresis. It looks like today's weight was recorded after Dr. Haroldine Laws rounded, today he is 244, down 6 lbs from yesterday. So I don't think we need to increase diuretics, will change metolazone back to 5mg  once daily. His dry weight by the records is 240 lbs, but he may have more to give. Chronic hypoxic respiratory failure seems to have returned to his baseline, he's using 2 L/m of supplemental oxygen by nasal cannula which is typical for him. He continues to have a indwelling Foley catheter since admission for bladder outlet obstruction. I think when he completes diuresis and we should give him a trial without the Foley and do a postvoid residual. We are waiting SNF placement for rehab, have not heard about the facility that he wants. Patient does not want to talk with palliative care, he understands his prognosis with some denial, only wants to hear good news, we tried our best to support him today and I think he will be ready to discharge to SNF tomorrow.

## 2017-06-11 NOTE — Progress Notes (Signed)
Pt has bed at [name]- they can accept patient over the wknd if stable- per MD hopeful for DC 9/1  CSW will continue to follow  Jorge Ny, [place] Social Worker [phone]

## 2017-06-11 NOTE — Progress Notes (Signed)
Occupational Therapy Treatment Patient Details Name: Christian Garner MRN: [id number] DOB: 1946/09/28 Today's Date: 06/11/2017    History of present illness Patient is a 71 y/o male who presents with worsened functional activity and left foot wound. 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 CXR-Moderate right and small left pleural effusions with adjacent with bibasilar opacities. PMH includes CHF, EF 30-35%, CAd with multiple stents, LE cellulitis.   OT comments  Pt progressing towards OT goals this session. Pt states that he has AE for LB dressing and elects not to use it. He was able to perform transfers with min guard assist this session. Discussed shower safety and energy conservation in detail (handout provided) this session. Pt also excited because he already feels stronger, excited about rehab prior to home. Pt continues to benefit from skilled OT in the acute setting.   Follow Up Recommendations  SNF    Equipment Recommendations  3 in 1 bedside commode    Recommendations for Other Services      Precautions / Restrictions Precautions Precautions: Fall Precaution Comments: watch 02 Restrictions Weight Bearing Restrictions: No       Mobility Bed Mobility               General bed mobility comments: Pt sitting in chair  Transfers Overall transfer level: Needs assistance Equipment used: Rolling walker (2 wheeled) Transfers: Sit to/from Stand;Stand Pivot Transfers Sit to Stand: Min guard Stand pivot transfers: Min guard       General transfer comment: vc for safe hand placement min guard for safety    Balance Overall balance assessment: Needs assistance Sitting-balance support: Feet supported;Bilateral upper extremity supported Sitting balance-Leahy Scale: Good Sitting balance - Comments: ABle to sit statically with UE support.   Standing balance support: Bilateral upper extremity supported Standing balance-Leahy Scale:  Poor Standing balance comment: reliant on external support                           ADL either performed or assessed with clinical judgement   ADL Overall ADL's : Needs assistance/impaired     Grooming: Wash/dry hands;Wash/dry face;Set up;Sitting Grooming Details (indicate cue type and reason): in recliner             Lower Body Dressing: Maximal assistance;Sit to/from stand Lower Body Dressing Details (indicate cue type and reason): from recliner. socks Toilet Transfer: Min Dentist Details (indicate cue type and reason): transfer to the right, no physical assist needed         Functional mobility during ADLs: Min guard;Rolling walker (stand pivot only this session) General ADL Comments: Pt states that he is getting stronger     Vision       Perception     Praxis      Cognition Arousal/Alertness: Awake/alert Behavior During Therapy: WFL for tasks assessed/performed Overall Cognitive Status: Within Functional Limits for tasks assessed                                 General Comments: "my memory is starting to go though"        Exercises     Shoulder Instructions       General Comments      Pertinent Vitals/ Pain       Pain Assessment: Faces Faces Pain Scale: Hurts little more Pain Location: L Foot Pain Descriptors /  Indicators: Discomfort;Tender;Tightness Pain Intervention(s): Monitored during session;Repositioned  Home Living                                          Prior Functioning/Environment              Frequency  Min 2X/week        Progress Toward Goals  OT Goals(current goals can now be found in the care plan section)  Progress towards OT goals: Progressing toward goals  Acute Rehab OT Goals Patient Stated Goal: to get stronger  OT Goal Formulation: With patient Time For Goal Achievement: 06/19/17 Potential to Achieve Goals: Good  Plan Discharge  plan remains appropriate;Frequency remains appropriate    Co-evaluation                 AM-PAC PT "6 Clicks" Daily Activity     Outcome Measure   Help from another person eating meals?: None Help from another person taking care of personal grooming?: A Little Help from another person toileting, which includes using toliet, bedpan, or urinal?: A Little Help from another person bathing (including washing, rinsing, drying)?: A Lot Help from another person to put on and taking off regular upper body clothing?: None Help from another person to put on and taking off regular lower body clothing?: A Lot 6 Click Score: 18    End of Session Equipment Utilized During Treatment: Oxygen  OT Visit Diagnosis: Unsteadiness on feet (R26.81);Muscle weakness (generalized) (M62.81)   Activity Tolerance Patient tolerated treatment well   Patient Left with call bell/phone within reach;Other (comment) [facility] [address])   Nurse Communication Mobility status;Other (comment) (on [facility] - Pt verbally said he would call when done)        Time: 0109-3235 OT Time Calculation (min): 36 min  Charges: OT General Charges $OT Visit: 1 Visit OT Treatments $Self Care/Home Management : 23-37 mins  Hulda Humphrey OTR/L [place] 06/11/2017, 4:39 PM

## 2017-06-12 DIAGNOSIS — M86172 Other acute osteomyelitis, left ankle and foot: Secondary | ICD-10-CM | POA: Diagnosis present

## 2017-06-12 DIAGNOSIS — M86672 Other chronic osteomyelitis, left ankle and foot: Secondary | ICD-10-CM | POA: Diagnosis not present

## 2017-06-12 DIAGNOSIS — L03116 Cellulitis of left lower limb: Secondary | ICD-10-CM

## 2017-06-12 DIAGNOSIS — L97522 Non-pressure chronic ulcer of other part of left foot with fat layer exposed: Secondary | ICD-10-CM | POA: Diagnosis not present

## 2017-06-12 DIAGNOSIS — I110 Hypertensive heart disease with heart failure: Secondary | ICD-10-CM | POA: Diagnosis present

## 2017-06-12 DIAGNOSIS — Z7901 Long term (current) use of anticoagulants: Secondary | ICD-10-CM

## 2017-06-12 DIAGNOSIS — E1122 Type 2 diabetes mellitus with diabetic chronic kidney disease: Secondary | ICD-10-CM | POA: Diagnosis not present

## 2017-06-12 DIAGNOSIS — E875 Hyperkalemia: Secondary | ICD-10-CM | POA: Diagnosis not present

## 2017-06-12 DIAGNOSIS — R2681 Unsteadiness on feet: Secondary | ICD-10-CM | POA: Diagnosis not present

## 2017-06-12 DIAGNOSIS — M869 Osteomyelitis, unspecified: Secondary | ICD-10-CM | POA: Diagnosis not present

## 2017-06-12 DIAGNOSIS — R5383 Other fatigue: Secondary | ICD-10-CM | POA: Diagnosis not present

## 2017-06-12 DIAGNOSIS — S81001A Unspecified open wound, right knee, initial encounter: Secondary | ICD-10-CM | POA: Diagnosis not present

## 2017-06-12 DIAGNOSIS — L97529 Non-pressure chronic ulcer of other part of left foot with unspecified severity: Secondary | ICD-10-CM | POA: Diagnosis present

## 2017-06-12 DIAGNOSIS — E1142 Type 2 diabetes mellitus with diabetic polyneuropathy: Secondary | ICD-10-CM | POA: Diagnosis present

## 2017-06-12 DIAGNOSIS — I5040 Unspecified combined systolic (congestive) and diastolic (congestive) heart failure: Secondary | ICD-10-CM | POA: Diagnosis not present

## 2017-06-12 DIAGNOSIS — I959 Hypotension, unspecified: Secondary | ICD-10-CM | POA: Diagnosis not present

## 2017-06-12 DIAGNOSIS — I509 Heart failure, unspecified: Secondary | ICD-10-CM | POA: Diagnosis not present

## 2017-06-12 DIAGNOSIS — R109 Unspecified abdominal pain: Secondary | ICD-10-CM | POA: Diagnosis not present

## 2017-06-12 DIAGNOSIS — J90 Pleural effusion, not elsewhere classified: Secondary | ICD-10-CM | POA: Diagnosis not present

## 2017-06-12 DIAGNOSIS — R160 Hepatomegaly, not elsewhere classified: Secondary | ICD-10-CM | POA: Diagnosis not present

## 2017-06-12 DIAGNOSIS — M79675 Pain in left toe(s): Secondary | ICD-10-CM | POA: Diagnosis not present

## 2017-06-12 DIAGNOSIS — Z7984 Long term (current) use of oral hypoglycemic drugs: Secondary | ICD-10-CM

## 2017-06-12 DIAGNOSIS — I517 Cardiomegaly: Secondary | ICD-10-CM | POA: Diagnosis not present

## 2017-06-12 DIAGNOSIS — N184 Chronic kidney disease, stage 4 (severe): Secondary | ICD-10-CM | POA: Diagnosis not present

## 2017-06-12 DIAGNOSIS — J9610 Chronic respiratory failure, unspecified whether with hypoxia or hypercapnia: Secondary | ICD-10-CM | POA: Diagnosis not present

## 2017-06-12 DIAGNOSIS — I480 Paroxysmal atrial fibrillation: Secondary | ICD-10-CM | POA: Diagnosis not present

## 2017-06-12 DIAGNOSIS — I255 Ischemic cardiomyopathy: Secondary | ICD-10-CM | POA: Diagnosis not present

## 2017-06-12 DIAGNOSIS — E11621 Type 2 diabetes mellitus with foot ulcer: Secondary | ICD-10-CM | POA: Diagnosis not present

## 2017-06-12 DIAGNOSIS — L03119 Cellulitis of unspecified part of limb: Secondary | ICD-10-CM | POA: Diagnosis not present

## 2017-06-12 DIAGNOSIS — Z9889 Other specified postprocedural states: Secondary | ICD-10-CM

## 2017-06-12 DIAGNOSIS — Z79899 Other long term (current) drug therapy: Secondary | ICD-10-CM | POA: Diagnosis not present

## 2017-06-12 DIAGNOSIS — I10 Essential (primary) hypertension: Secondary | ICD-10-CM | POA: Diagnosis not present

## 2017-06-12 DIAGNOSIS — R278 Other lack of coordination: Secondary | ICD-10-CM | POA: Diagnosis not present

## 2017-06-12 DIAGNOSIS — D72829 Elevated white blood cell count, unspecified: Secondary | ICD-10-CM | POA: Diagnosis not present

## 2017-06-12 DIAGNOSIS — I5023 Acute on chronic systolic (congestive) heart failure: Secondary | ICD-10-CM

## 2017-06-12 DIAGNOSIS — I25110 Atherosclerotic heart disease of native coronary artery with unstable angina pectoris: Secondary | ICD-10-CM | POA: Diagnosis not present

## 2017-06-12 DIAGNOSIS — Z955 Presence of coronary angioplasty implant and graft: Secondary | ICD-10-CM

## 2017-06-12 DIAGNOSIS — E1169 Type 2 diabetes mellitus with other specified complication: Secondary | ICD-10-CM | POA: Diagnosis present

## 2017-06-12 DIAGNOSIS — I4891 Unspecified atrial fibrillation: Secondary | ICD-10-CM | POA: Diagnosis not present

## 2017-06-12 DIAGNOSIS — I5043 Acute on chronic combined systolic (congestive) and diastolic (congestive) heart failure: Secondary | ICD-10-CM | POA: Diagnosis not present

## 2017-06-12 DIAGNOSIS — Z87891 Personal history of nicotine dependence: Secondary | ICD-10-CM | POA: Diagnosis not present

## 2017-06-12 DIAGNOSIS — I2510 Atherosclerotic heart disease of native coronary artery without angina pectoris: Secondary | ICD-10-CM | POA: Diagnosis not present

## 2017-06-12 DIAGNOSIS — M6281 Muscle weakness (generalized): Secondary | ICD-10-CM | POA: Diagnosis not present

## 2017-06-12 DIAGNOSIS — E7800 Pure hypercholesterolemia, unspecified: Secondary | ICD-10-CM | POA: Diagnosis present

## 2017-06-12 DIAGNOSIS — N179 Acute kidney failure, unspecified: Secondary | ICD-10-CM | POA: Diagnosis not present

## 2017-06-12 DIAGNOSIS — L97524 Non-pressure chronic ulcer of other part of left foot with necrosis of bone: Secondary | ICD-10-CM | POA: Diagnosis not present

## 2017-06-12 DIAGNOSIS — E119 Type 2 diabetes mellitus without complications: Secondary | ICD-10-CM | POA: Diagnosis not present

## 2017-06-12 LAB — CBC
HEMATOCRIT: 35.7 % — AB (ref 39.0–52.0)
Hemoglobin: 10.8 g/dL — ABNORMAL LOW (ref 13.0–17.0)
MCH: 27.6 pg (ref 26.0–34.0)
MCHC: 30.3 g/dL (ref 30.0–36.0)
MCV: 91.3 fL (ref 78.0–100.0)
PLATELETS: 385 10*3/uL (ref 150–400)
RBC: 3.91 MIL/uL — AB (ref 4.22–5.81)
RDW: 17.9 % — AB (ref 11.5–15.5)
WBC: 14.7 10*3/uL — AB (ref 4.0–10.5)

## 2017-06-12 LAB — BASIC METABOLIC PANEL
Anion gap: 13 (ref 5–15)
BUN: 41 mg/dL — AB (ref 6–20)
CHLORIDE: 90 mmol/L — AB (ref 101–111)
CO2: 32 mmol/L (ref 22–32)
CREATININE: 1.47 mg/dL — AB (ref 0.61–1.24)
Calcium: 8.6 mg/dL — ABNORMAL LOW (ref 8.9–10.3)
GFR calc Af Amer: 54 mL/min — ABNORMAL LOW (ref >60)
GFR calc non Af Amer: 46 mL/min — ABNORMAL LOW (ref >60)
Glucose, Bld: 141 mg/dL — ABNORMAL HIGH (ref 65–99)
POTASSIUM: 4.3 mmol/L (ref 3.5–5.1)
Sodium: 135 mmol/L (ref 135–145)

## 2017-06-12 LAB — GLUCOSE, CAPILLARY
GLUCOSE-CAPILLARY: 139 mg/dL — AB (ref 65–99)
GLUCOSE-CAPILLARY: 137 mg/dL — AB (ref 65–99)
GLUCOSE-CAPILLARY: 161 mg/dL — AB (ref 65–99)
Glucose-Capillary: 215 mg/dL — ABNORMAL HIGH (ref 65–99)

## 2017-06-12 LAB — CULTURE, BLOOD (ROUTINE X 2)
CULTURE: NO GROWTH
Culture: NO GROWTH
SPECIAL REQUESTS: ADEQUATE
SPECIAL REQUESTS: ADEQUATE

## 2017-06-12 LAB — MAGNESIUM: Magnesium: 2 mg/dL (ref 1.7–2.4)

## 2017-06-12 LAB — PROTIME-INR
INR: 1.3
Prothrombin Time: 16 seconds — ABNORMAL HIGH (ref 11.4–15.2)

## 2017-06-12 MED ORDER — BUMETANIDE 2 MG PO TABS: 4.0000 mg | ORAL_TABLET | Freq: Two times a day (BID) | ORAL | 5 refills | Status: AC

## 2017-06-12 MED ORDER — POTASSIUM CHLORIDE CRYS ER 20 MEQ PO TBCR: 40.0000 meq | EXTENDED_RELEASE_TABLET | Freq: Two times a day (BID) | ORAL | 1 refills | Status: AC

## 2017-06-12 MED ORDER — WARFARIN SODIUM 5 MG PO TABS
5.0000 mg | ORAL_TABLET | Freq: Once | ORAL | Status: AC
  Administered 2017-06-12: 5 mg via ORAL
  Filled 2017-06-12: qty 1

## 2017-06-12 MED ORDER — BUMETANIDE 2 MG PO TABS
4.0000 mg | ORAL_TABLET | Freq: Two times a day (BID) | ORAL | Status: DC
  Filled 2017-06-12: qty 2

## 2017-06-12 MED ORDER — METOLAZONE 5 MG PO TABS: 5.0000 mg | ORAL_TABLET | Freq: Every day | ORAL | 5 refills | Status: DC

## 2017-06-12 MED ORDER — WARFARIN SODIUM 2 MG PO TABS: 2.0000 mg | ORAL_TABLET | Freq: Every day | ORAL | 1 refills | Status: AC

## 2017-06-12 NOTE — Clinical Social Work Placement (Signed)
   CLINICAL SOCIAL WORK PLACEMENT  NOTE  Date:  06/12/2017  Patient Details  Name: Christian Garner MRN: [id number] Date of Birth: 1946/06/09  Clinical Social Work is seeking post-discharge placement for this patient at the [place] level of care (*CSW will initial, date and re-position this form in  chart as items are completed):      Patient/family provided with [employer]'s list of facilities offering this level of care within the geographic area requested by the patient (or if unable, by the patient's family).      Patient/family informed of their freedom to choose among providers that offer the needed level of care, that participate in Medicare, Medicaid or managed care program needed by the patient, have an available bed and are willing to accept the patient.      Patient/family informed of [facility]'s ownership interest in [facility] and [facility], as well as of the fact that they are under no obligation to receive care at these facilities.  PASRR submitted to EDS on       PASRR number received on       Existing PASRR number confirmed on 06/09/17     FL2 transmitted to all facilities in geographic area requested by pt/family on 06/10/17     FL2 transmitted to all facilities within larger geographic area on       Patient informed that his/her managed care company has contracts with or will negotiate with certain facilities, including the following:        Yes   Patient/family informed of bed offers received.  Patient chooses bed at [facility]     Physician recommends and patient chooses bed at      Patient to be transferred to [facility] on 06/12/17.  Patient to be transferred to facility by [facility]     Patient family notified on 06/12/17 of transfer.  Name of family member notified:        PHYSICIAN       Additional Comment:     _______________________________________________ Eileen Stanford, LCSW 06/12/2017, 2:40 PM

## 2017-06-12 NOTE — Progress Notes (Signed)
  Date: 06/12/2017  Patient name: Christian Garner  Medical record number: [id number]  Date of birth: 1946/01/19   I have seen and evaluated this patient and I have discussed the plan of care with the house staff. Please see their note for complete details. I concur with their findings. Without difficulty urinating after foley removal. Stable for D/C SNF.  Bartholomew Crews, MD 06/12/2017, 2:48 PM

## 2017-06-12 NOTE — Progress Notes (Signed)
Chart reviewed - creatinine further improved with diuresis. Markedly net negative on diuretics. Not a candidate for inotropes or advanced therapy per Dr. Haroldine Laws. Palliative care recommended. For probably SNF transfer today. Given the high dose of his diuretics of lasix 120 mg TID and metolazone 5 mg daily (Weight 237), would switch lasix to Bumex 4 mg po BID and continue metolazone 5 mg daily. Should still diurese on this combination of medications.  Pixie Casino, MD, [place]  Attending Cardiologist  Direct Dial: [phone]  Fax: [phone]  Website:  [email or web address]

## 2017-06-12 NOTE — Progress Notes (Signed)
Subjective: No acute events overnight, pt is happy with his decreased weight, down to 237, consistent with est dry weight based on prior hospitalization. Denies SOB or other complaints. Eager to get to rehab.   Objective:  Vital signs in last 24 hours: Vitals:   06/11/17 2353 06/12/17 0418 06/12/17 0717 06/12/17 0800  BP: 120/60 102/62  (!) 111/59  Pulse: 63 61  87  Resp: 17 16  16   Temp: 97.7 F (36.5 C) 97.9 F (36.6 C)  97.9 F (36.6 C)  TempSrc: Oral Oral  Oral  SpO2: 99% 100%  100%  Weight:   237 lb 12.8 oz (107.9 kg)   Height:       Physical Exam  Constitutional:  Elderly gentleman sitting up in chair on [address], eating breakfast   HENT:  Head: Normocephalic and atraumatic.  Cardiovascular: Normal rate and regular rhythm.   Pulmonary/Chest: Effort normal. No respiratory distress.  Abdominal:  Obese abdomen  Musculoskeletal:  Bilateral leg wraps in place, dressing to L fifth toe in place   Neurological: He is alert.  Skin: Skin is warm.    Assessment/Plan: Acute on Chronic Combine Systolic and Diastolic HF,  Ischemic Cardiomyopathy  Pt with advanced heart failure, EF 30-35% with cor pulmonale, and unfortunately, limited options for further treatment. He presented with increased weight (est dry weight ~238 lbs), decreased function status, with chronic pleural effusions and evidence of volume overload, confirmed with POC [place] of IVC. Aggressive diuresis with Lasix + metolazone was initiated with caution to avoid decreasing his pre-load too abruptly with R sided failure. He has responded well with good urine output and reduction in weight. Once INR was <2,  R sided therapeutic thoracentesis was performed, removed 1.4 L without complications, labs consistent with transudative effusion and prior fluid analysis as anticipated.     --Transition to PO diuretic regimen: Bumex 4 mg BID + Metolazone 5 mg daily  --Remove foley, voiding trial --BMP + Mg, Daily weights, strict  I/Os --Heart Failure Consult, appreciate their recommendations     Acute on Chronic Kidney Injury Pt presented with oliguria, Cr elevated to 3.2 (baseline ~1.3-1.5). In the past he has required CRRT. A foley was placed and he has had good urine output and improvement in his Cr with diuresis, consistent with a cardiorenal etiology of his worsening kidney function. Cr currently 1.47, nearing his baseline. Will plan for voiding trial without foley catheter prior to discharge.  --Continue diuresis, monitoring as above, voiding trial without foley   Cellulitis of LLE, Necrotic L 5th Digit (LE) Pt has a chronic wound to L 5th digit likely due to poor perfusion, appears necrotic, pt unlikely to be a surgical candidate. Erythema of bilateral LEs L>R on presentation. He was started on IV Ceftriaxone. His erythema of his LEs seem to have a large component of venous stasis, though he did present with an elevated white count which has stayed stable. Anti-biotics were stopped 8/29, has remained stable. Will continue to monitor    Paroxysmal Atrial Fibrillation Pt has been in sinus rhythm during admission, is on home Amiodarone 200 mg BID and Coumadin. Supra-therapeutic INR on admission, currently holding home Coumadin with plan to start heparin when INR at 2. In peri-procedure period, oral vitamin K was administered with improvement in INR. INR currently 1.34.  --Warfarin at 2 mg daily    --Daily PT/INR --Monitor for bleeding   Dispo: Anticipated discharge in approximately 2-3 day(s).   Tawny Asal, MD 06/12/2017, 12:48 PM Pager:  [phone]  

## 2017-06-12 NOTE — Clinical Social Work Note (Signed)
Clinical Social Worker facilitated patient discharge including contacting patient family and facility to confirm patient discharge plans.  Clinical information faxed to facility and family agreeable with plan.  CSW arranged ambulance transport via [facility] to [facility].  RN to call [phone] for report prior to discharge.  Clinical Social Worker will sign off for now as social work intervention is no longer needed. Please consult [place] again if new need arises.  [place], [place]

## 2017-06-12 NOTE — Discharge Summary (Signed)
Name: Christian Garner MRN: [id number] DOB: 11-Nov-1945 71 y.o. PCP: [name], MD  Date of Admission: 06/07/2017  9:24 AM Date of Discharge: 06/12/2017 Attending Physician: Axel Filler, *  Discharge Diagnosis: Principal Problem:   Acute on chronic combined systolic and diastolic CHF (congestive heart failure) ([place]) Active Problems:   AKI (acute kidney injury) ([place])   AF (paroxysmal atrial fibrillation) (HCC)   Cellulitis of left lower extremity   HFrEF (heart failure with reduced ejection fraction) (HCC)   CAD (coronary artery disease)   Discharge Medications: Allergies as of 06/12/2017   No Known Allergies     Medication List    STOP taking these medications   aspirin EC 81 MG tablet   insulin aspart 100 UNIT/ML injection Commonly known as:  novoLOG   insulin glargine 100 unit/mL Sopn Commonly known as:  LANTUS   ondansetron 4 MG tablet Commonly known as:  ZOFRAN   PEN NEEDLES 29GX1/2" 29G X 12MM Misc   torsemide 20 MG tablet Commonly known as:  DEMADEX     TAKE these medications   acetaminophen 325 MG tablet Commonly known as:  TYLENOL Take 975 mg by mouth at bedtime as needed for mild pain.   amiodarone 200 MG tablet Commonly known as:  PACERONE Take 1 tablet (200 mg total) by mouth 2 (two) times daily.   atorvastatin 40 MG tablet Commonly known as:  LIPITOR Take 40 mg by mouth at bedtime.   bismuth subsalicylate 409 WJ/19JY suspension Commonly known as:  PEPTO BISMOL Take 30 mLs by mouth every 6 (six) hours as needed for indigestion.   bumetanide 2 MG tablet Commonly known as:  BUMEX Take 2 tablets (4 mg total) by mouth 2 (two) times daily.   calcium carbonate 500 MG chewable tablet Commonly known as:  TUMS - dosed in mg elemental calcium Chew 1 tablet by mouth daily as needed for indigestion or heartburn.   ferrous sulfate 325 (65 FE) MG tablet Take 325 mg by mouth daily with breakfast.   fluticasone 50 MCG/ACT nasal  spray Commonly known as:  FLONASE Place 1 spray into both nostrils daily as needed for allergies or rhinitis.   lisinopril 2.5 MG tablet Commonly known as:  PRINIVIL,ZESTRIL Take 2.5 mg by mouth daily.   metolazone 5 MG tablet Commonly known as:  ZAROXOLYN Take 1 tablet (5 mg total) by mouth daily. 30 minutes before Bumex dose What changed:  when to take this  additional instructions   nitroGLYCERIN 0.4 MG SL tablet Commonly known as:  NITROSTAT Place 0.4 mg under the tongue every 5 (five) minutes as needed for chest pain.   potassium chloride SA 20 MEQ tablet Commonly known as:  K-DUR,KLOR-CON Take 2 tablets (40 mEq total) by mouth 2 (two) times daily.   sitaGLIPtin 100 MG tablet Commonly known as:  JANUVIA Take 100 mg by mouth daily.   sodium chloride 0.65 % Soln nasal spray Commonly known as:  OCEAN Place 2 sprays into both nostrils 3 (three) times daily as needed for congestion.   traZODone 50 MG tablet Commonly known as:  DESYREL Take 50 mg by mouth at bedtime.   vitamin B-12 1000 MCG tablet Commonly known as:  CYANOCOBALAMIN Take 1,000 mcg by mouth daily.   warfarin 2 MG tablet Commonly known as:  COUMADIN Take 1 tablet (2 mg total) by mouth daily. What changed:  medication strength  how much to take  when to take this  Discharge Care Instructions        Start     Ordered   06/12/17 0000  bumetanide (BUMEX) 2 MG tablet  2 times daily     06/12/17 1401   06/12/17 0000  metolazone (ZAROXOLYN) 5 MG tablet  Daily     06/12/17 1401   06/12/17 0000  potassium chloride SA (K-DUR,KLOR-CON) 20 MEQ tablet  2 times daily     06/12/17 1401   06/12/17 0000  warfarin (COUMADIN) 2 MG tablet  Daily     06/12/17 1401   06/12/17 0000  Discharge instructions    Comments:  -Rehab to increase conditioning and functional status  -See discharge summary   06/12/17 1401      Disposition and follow-up:   Christian Garner was discharged from  [facility] in Stable condition.  At the hospital follow up visit please address:  1.  --Assess effectiveness of oral diuretic regimen (Bumex 40 mg BID + Metolazone 5 mg daily) with weights and volume/respiratory status. Ideal weight is around 240 lbs  --Ensure kidney function has remained at baseline (Cr ~1.3) --Assess INR is in therapeutic range on Warfarin regimen (2 mg daily) and adjust as necessary  --Assess need for K supplementation  --Continue Unna boot wraps for venous stasis and LE edema --Continue wound care to LLE 5th digit wound: twice daily saline dressing and elevation  2.  Labs / imaging needed at time of follow-up: PT/INR, BMP (Cr, K)  3.  Pending labs/ test needing follow-up: None   Follow-up Appointments:   Hospital Course by problem list: Acute on Chronic Combined Systolic and Diastolic HF,  Ischemic Cardiomyopathy Pt presented with increasing SOB, edema, and weight (260 lbs) after failing outpatient adjustments of his diuretic dosing. He has advanced heart failure with EF 30-35% with cor pulmonale, evaluated by multiple providers. Unfortunately, he has not been a candidate for advanced therapies including outpatient ionotropes. His estimated dry weight was ~238 lbs based on a previous admission at [facility] on 7/30. The heart failure team was consulted to aid in management. Diuresis with IV Lasix 120 mg TID + Metolazone 5 mg daily was initiated. He responded well with good urine output and steady reduction in his weight. He has soft BP as a result of his HF and dependence on pre-load with cor pulmonale. He remained stable throughout admission and tolerated diuresis well. Upon reaching his est dry weight, diuresis was transitioned to a PO regimen of PO Bumex 4 mg BID and Metolazone 5 mg daily.  Upon admission, code status was discussed and he indicated DNR. He demonstrates understanding of his advanced HF and overall prognosis. He states he has made plans for end  of life regarding finances and expressed his goal was to return home following rehab, however did not want to endure prolonged suffering. On offering to discuss his end of life wishes with palliative care, he adamantly denied and did not want to discuss advanced directive issues any further.     Acute on Chronic Kidney Injury (CKD IV) Pt presented with Cr elevated to 3.2 (baseline Cr ~1.3-1.5) and oliguria. An in/out cath removed 600 ccs of urine-a foley was placed for concern of retention and for aggressive diuresis. His home ACE inhibitor was held. This AKI was felt to be due to cardiorenal syndrome with poor renal perfusion. With diuresis and improvement in volume status, his Cr steadily improved to his baseline. Prior to discharge, a voiding trial was conducted with removal of  foley catheter. He urinated multiple times with no post-void residual on bladder scan. On discharge, Cr was 1.47. As his kidney function had returned to baseline, ACE inhibitor was resumed on discharge medications.    R Pleural Effusion Presented with large R sided pleural effusion, consistent with prior presentations. This was felt to be contributing to his dyspnea and increased oxygen requirement. Once his INR was <2, a therapeutic thoracentesis was performed, removing 1.4 L of transudative fluid. His respiratory symptoms improved, no complications experienced.   Necrotic L 5th Digit of LE Pt presented with increased edema of LEs, as well as erythema. There was initial concern for cellulitis of LLE due to somewhat asymmetric erythema, elevated white count and he received ~36 hours of Ceftriaxone. On repeat examination his erythema appears to be due to venous stasis, anti-biotics were discontinued and his LEs continued to improve with Unna boot wraps and diuresis. He also had a wound of the L 5th digit with evidence of necrosis likely due to poor perfusion. This digit will likely self-amputate and he is not a candidate for  surgical intervention given his co-morbidities. There are no signs of active infection. Wound care was consulted for wound management and recommended twice daily saline dressing and elevation. Would benefit from continued LE Unna boot wraps. He has previously been seen by wound care center as an outpatient.   Paroxysmal Atrial Fibrillation Pt currently on amiodarone and warfarin. Amiodarone continued during admission and he remained in normal sinus rhythm. Per wife report, warfarin dosing had undergone various revisions due to supratherapeutic INR as an outpatient. He was on 3 mg daily at time of presentation, initial INR was >6. Warfarin was held, oral Vitamin K administered for procedure as described above. Prior to discharge his INR was 1.30. He received a one time 5 mg dose to overcome vitamin K administration and was discharged on 2 mg daily after consulting with pharmacy. This regimen should be titrated to achieve INR 2-3.    Hyperkalemia Presented with K of 5.4 and has had history of hyperkalemia related to ARB. No EKG changes were presented and his potassium decreased with loop diuretics. Given his reduced EF, he was started on potassium supplementation to maintain K above 4. He was discharged on 40 mEq BID which can be adjusted if necessary.   CAD (s/p DES to LAD and RCA) Has resulted in ischemic cardiomyopathy as above, currently on atorvastatin 40 mg daily which was continued during admission.   Type 2 Diabetes Mellitus On home Sitagliptin, not currently taking insulin which was removed from his medication list.     Discharge Vitals:   BP (!) 111/59 (BP Location: Right Arm)   Pulse 87   Temp 97.9 F (36.6 C) (Oral)   Resp 16   Ht 6\' 2"  (1.88 m)   Wt 237 lb 12.8 oz (107.9 kg)   SpO2 100%   BMI 30.53 kg/m   Pertinent Labs, Studies, and Procedures:  R Thoracentesis: Using ultrasound guidance a large pleural effusion was noted on the right.  The area was prepped with chlorhexadine  and draped in sterile fashion.  Following this 10 mL of 1% lidocaine was injected subcutaneously and deep to provide anesthesia.  A small incision was then made parallel and superior to the rib, the thoracentesis needle with catheter was inserted into the chest wall and advanced under constant aspiration.  Upon aspiration of pleural fluid, the catheter was advanced into the pleural space and the needle was removed.  The  catheter was then connected to a drainange bag and the fluid was removed under manual drainage. The catheter was then removed during slow forced exhalation and a sterile bandage was placed. BMP Latest Ref Rng & Units 06/12/2017 06/11/2017 06/10/2017  Glucose 65 - 99 mg/dL 141(H) 146(H) 120(H)  BUN 6 - 20 mg/dL 41(H) 48(H) 55(H)  Creatinine 0.61 - 1.24 mg/dL 1.47(H) 1.69(H) 1.88(H)  Sodium 135 - 145 mmol/L 135 135 135  Potassium 3.5 - 5.1 mmol/L 4.3 3.9 3.5  Chloride 101 - 111 mmol/L 90(L) 92(L) 92(L)  CO2 22 - 32 mmol/L 32 34(H) 33(H)  Calcium 8.9 - 10.3 mg/dL 8.6(L) 8.4(L) 8.3(L)    Discharge Instructions: Discharge Instructions    Discharge instructions    Complete by:  As directed    -Rehab to increase conditioning and functional status  -See discharge summary      Signed: Tawny Asal, MD 06/12/2017, 2:01 PM   Pager: [phone]

## 2017-06-13 LAB — BODY FLUID CULTURE: Culture: NO GROWTH

## 2017-06-21 DIAGNOSIS — R160 Hepatomegaly, not elsewhere classified: Secondary | ICD-10-CM | POA: Diagnosis not present

## 2017-06-22 DIAGNOSIS — Z89422 Acquired absence of other left toe(s): Secondary | ICD-10-CM | POA: Diagnosis not present

## 2017-06-22 DIAGNOSIS — L03032 Cellulitis of left toe: Secondary | ICD-10-CM | POA: Diagnosis not present

## 2017-06-22 DIAGNOSIS — D72829 Elevated white blood cell count, unspecified: Secondary | ICD-10-CM | POA: Diagnosis not present

## 2017-06-22 DIAGNOSIS — I4891 Unspecified atrial fibrillation: Secondary | ICD-10-CM | POA: Diagnosis not present

## 2017-06-22 DIAGNOSIS — N179 Acute kidney failure, unspecified: Secondary | ICD-10-CM | POA: Diagnosis not present

## 2017-06-22 DIAGNOSIS — L97503 Non-pressure chronic ulcer of other part of unspecified foot with necrosis of muscle: Secondary | ICD-10-CM | POA: Diagnosis not present

## 2017-06-22 DIAGNOSIS — I517 Cardiomegaly: Secondary | ICD-10-CM | POA: Diagnosis not present

## 2017-06-22 DIAGNOSIS — Z899 Acquired absence of limb, unspecified: Secondary | ICD-10-CM | POA: Diagnosis not present

## 2017-06-22 DIAGNOSIS — D649 Anemia, unspecified: Secondary | ICD-10-CM | POA: Diagnosis not present

## 2017-06-22 DIAGNOSIS — B999 Unspecified infectious disease: Secondary | ICD-10-CM | POA: Diagnosis not present

## 2017-06-22 DIAGNOSIS — L97522 Non-pressure chronic ulcer of other part of left foot with fat layer exposed: Secondary | ICD-10-CM | POA: Diagnosis not present

## 2017-06-22 DIAGNOSIS — I509 Heart failure, unspecified: Secondary | ICD-10-CM | POA: Diagnosis not present

## 2017-06-22 DIAGNOSIS — I959 Hypotension, unspecified: Secondary | ICD-10-CM | POA: Diagnosis not present

## 2017-06-22 DIAGNOSIS — M86172 Other acute osteomyelitis, left ankle and foot: Secondary | ICD-10-CM | POA: Diagnosis present

## 2017-06-22 DIAGNOSIS — E7800 Pure hypercholesterolemia, unspecified: Secondary | ICD-10-CM | POA: Diagnosis present

## 2017-06-22 DIAGNOSIS — N184 Chronic kidney disease, stage 4 (severe): Secondary | ICD-10-CM | POA: Diagnosis not present

## 2017-06-22 DIAGNOSIS — I129 Hypertensive chronic kidney disease with stage 1 through stage 4 chronic kidney disease, or unspecified chronic kidney disease: Secondary | ICD-10-CM | POA: Diagnosis not present

## 2017-06-22 DIAGNOSIS — I5043 Acute on chronic combined systolic (congestive) and diastolic (congestive) heart failure: Secondary | ICD-10-CM | POA: Diagnosis not present

## 2017-06-22 DIAGNOSIS — J9610 Chronic respiratory failure, unspecified whether with hypoxia or hypercapnia: Secondary | ICD-10-CM | POA: Diagnosis not present

## 2017-06-22 DIAGNOSIS — R2681 Unsteadiness on feet: Secondary | ICD-10-CM | POA: Diagnosis not present

## 2017-06-22 DIAGNOSIS — S81001A Unspecified open wound, right knee, initial encounter: Secondary | ICD-10-CM | POA: Diagnosis not present

## 2017-06-22 DIAGNOSIS — L97524 Non-pressure chronic ulcer of other part of left foot with necrosis of bone: Secondary | ICD-10-CM | POA: Diagnosis not present

## 2017-06-22 DIAGNOSIS — M79675 Pain in left toe(s): Secondary | ICD-10-CM | POA: Diagnosis not present

## 2017-06-22 DIAGNOSIS — L8992 Pressure ulcer of unspecified site, stage 2: Secondary | ICD-10-CM | POA: Diagnosis not present

## 2017-06-22 DIAGNOSIS — L97529 Non-pressure chronic ulcer of other part of left foot with unspecified severity: Secondary | ICD-10-CM | POA: Diagnosis present

## 2017-06-22 DIAGNOSIS — I96 Gangrene, not elsewhere classified: Secondary | ICD-10-CM | POA: Diagnosis not present

## 2017-06-22 DIAGNOSIS — E1142 Type 2 diabetes mellitus with diabetic polyneuropathy: Secondary | ICD-10-CM | POA: Diagnosis present

## 2017-06-22 DIAGNOSIS — M869 Osteomyelitis, unspecified: Secondary | ICD-10-CM | POA: Diagnosis not present

## 2017-06-22 DIAGNOSIS — I10 Essential (primary) hypertension: Secondary | ICD-10-CM | POA: Diagnosis not present

## 2017-06-22 DIAGNOSIS — Z7901 Long term (current) use of anticoagulants: Secondary | ICD-10-CM | POA: Diagnosis not present

## 2017-06-22 DIAGNOSIS — E119 Type 2 diabetes mellitus without complications: Secondary | ICD-10-CM | POA: Diagnosis not present

## 2017-06-22 DIAGNOSIS — E1169 Type 2 diabetes mellitus with other specified complication: Secondary | ICD-10-CM | POA: Diagnosis present

## 2017-06-22 DIAGNOSIS — L03116 Cellulitis of left lower limb: Secondary | ICD-10-CM | POA: Diagnosis not present

## 2017-06-22 DIAGNOSIS — Z79899 Other long term (current) drug therapy: Secondary | ICD-10-CM | POA: Diagnosis not present

## 2017-06-22 DIAGNOSIS — S90929A Unspecified superficial injury of unspecified foot, initial encounter: Secondary | ICD-10-CM | POA: Diagnosis not present

## 2017-06-22 DIAGNOSIS — I5032 Chronic diastolic (congestive) heart failure: Secondary | ICD-10-CM | POA: Diagnosis not present

## 2017-06-22 DIAGNOSIS — M86672 Other chronic osteomyelitis, left ankle and foot: Secondary | ICD-10-CM | POA: Diagnosis not present

## 2017-06-22 DIAGNOSIS — E11621 Type 2 diabetes mellitus with foot ulcer: Secondary | ICD-10-CM | POA: Diagnosis not present

## 2017-06-22 DIAGNOSIS — I110 Hypertensive heart disease with heart failure: Secondary | ICD-10-CM | POA: Diagnosis present

## 2017-06-22 DIAGNOSIS — R5383 Other fatigue: Secondary | ICD-10-CM | POA: Diagnosis not present

## 2017-06-22 DIAGNOSIS — Z89022 Acquired absence of left finger(s): Secondary | ICD-10-CM | POA: Diagnosis not present

## 2017-06-22 DIAGNOSIS — M6281 Muscle weakness (generalized): Secondary | ICD-10-CM | POA: Diagnosis not present

## 2017-06-22 DIAGNOSIS — R278 Other lack of coordination: Secondary | ICD-10-CM | POA: Diagnosis not present

## 2017-06-22 DIAGNOSIS — Z87891 Personal history of nicotine dependence: Secondary | ICD-10-CM | POA: Diagnosis not present

## 2017-06-23 DIAGNOSIS — L03116 Cellulitis of left lower limb: Secondary | ICD-10-CM

## 2017-06-23 DIAGNOSIS — E11621 Type 2 diabetes mellitus with foot ulcer: Secondary | ICD-10-CM

## 2017-06-24 DIAGNOSIS — E11621 Type 2 diabetes mellitus with foot ulcer: Secondary | ICD-10-CM

## 2017-06-24 DIAGNOSIS — M86672 Other chronic osteomyelitis, left ankle and foot: Secondary | ICD-10-CM

## 2017-06-24 DIAGNOSIS — M869 Osteomyelitis, unspecified: Secondary | ICD-10-CM

## 2017-06-24 DIAGNOSIS — L03116 Cellulitis of left lower limb: Secondary | ICD-10-CM

## 2017-06-28 DIAGNOSIS — S90929A Unspecified superficial injury of unspecified foot, initial encounter: Secondary | ICD-10-CM | POA: Diagnosis not present

## 2017-06-28 DIAGNOSIS — J9610 Chronic respiratory failure, unspecified whether with hypoxia or hypercapnia: Secondary | ICD-10-CM | POA: Diagnosis not present

## 2017-06-28 DIAGNOSIS — M6281 Muscle weakness (generalized): Secondary | ICD-10-CM | POA: Diagnosis not present

## 2017-06-28 DIAGNOSIS — E11621 Type 2 diabetes mellitus with foot ulcer: Secondary | ICD-10-CM | POA: Diagnosis not present

## 2017-06-28 DIAGNOSIS — I110 Hypertensive heart disease with heart failure: Secondary | ICD-10-CM | POA: Diagnosis not present

## 2017-06-28 DIAGNOSIS — I129 Hypertensive chronic kidney disease with stage 1 through stage 4 chronic kidney disease, or unspecified chronic kidney disease: Secondary | ICD-10-CM | POA: Diagnosis not present

## 2017-06-28 DIAGNOSIS — R5383 Other fatigue: Secondary | ICD-10-CM | POA: Diagnosis not present

## 2017-06-28 DIAGNOSIS — L8992 Pressure ulcer of unspecified site, stage 2: Secondary | ICD-10-CM | POA: Diagnosis not present

## 2017-06-28 DIAGNOSIS — I5032 Chronic diastolic (congestive) heart failure: Secondary | ICD-10-CM | POA: Diagnosis not present

## 2017-06-28 DIAGNOSIS — Z7901 Long term (current) use of anticoagulants: Secondary | ICD-10-CM | POA: Diagnosis not present

## 2017-06-28 DIAGNOSIS — M869 Osteomyelitis, unspecified: Secondary | ICD-10-CM | POA: Diagnosis not present

## 2017-06-28 DIAGNOSIS — Z79899 Other long term (current) drug therapy: Secondary | ICD-10-CM | POA: Diagnosis not present

## 2017-06-28 DIAGNOSIS — E119 Type 2 diabetes mellitus without complications: Secondary | ICD-10-CM | POA: Diagnosis not present

## 2017-06-28 DIAGNOSIS — E039 Hypothyroidism, unspecified: Secondary | ICD-10-CM | POA: Diagnosis not present

## 2017-06-28 DIAGNOSIS — N179 Acute kidney failure, unspecified: Secondary | ICD-10-CM | POA: Diagnosis not present

## 2017-06-28 DIAGNOSIS — R278 Other lack of coordination: Secondary | ICD-10-CM | POA: Diagnosis not present

## 2017-06-28 DIAGNOSIS — L03116 Cellulitis of left lower limb: Secondary | ICD-10-CM | POA: Diagnosis not present

## 2017-06-28 DIAGNOSIS — I959 Hypotension, unspecified: Secondary | ICD-10-CM | POA: Diagnosis not present

## 2017-06-28 DIAGNOSIS — E1142 Type 2 diabetes mellitus with diabetic polyneuropathy: Secondary | ICD-10-CM | POA: Diagnosis not present

## 2017-06-28 DIAGNOSIS — I743 Embolism and thrombosis of arteries of the lower extremities: Secondary | ICD-10-CM | POA: Diagnosis not present

## 2017-06-28 DIAGNOSIS — I509 Heart failure, unspecified: Secondary | ICD-10-CM | POA: Diagnosis not present

## 2017-06-28 DIAGNOSIS — E1169 Type 2 diabetes mellitus with other specified complication: Secondary | ICD-10-CM | POA: Diagnosis not present

## 2017-06-28 DIAGNOSIS — I10 Essential (primary) hypertension: Secondary | ICD-10-CM | POA: Diagnosis not present

## 2017-06-28 DIAGNOSIS — S91301A Unspecified open wound, right foot, initial encounter: Secondary | ICD-10-CM | POA: Diagnosis not present

## 2017-06-28 DIAGNOSIS — D649 Anemia, unspecified: Secondary | ICD-10-CM | POA: Diagnosis not present

## 2017-06-28 DIAGNOSIS — E7800 Pure hypercholesterolemia, unspecified: Secondary | ICD-10-CM | POA: Diagnosis not present

## 2017-06-28 DIAGNOSIS — I4891 Unspecified atrial fibrillation: Secondary | ICD-10-CM | POA: Diagnosis not present

## 2017-06-28 DIAGNOSIS — I5043 Acute on chronic combined systolic (congestive) and diastolic (congestive) heart failure: Secondary | ICD-10-CM | POA: Diagnosis not present

## 2017-06-28 DIAGNOSIS — L97524 Non-pressure chronic ulcer of other part of left foot with necrosis of bone: Secondary | ICD-10-CM | POA: Diagnosis not present

## 2017-06-28 DIAGNOSIS — Z87891 Personal history of nicotine dependence: Secondary | ICD-10-CM | POA: Diagnosis not present

## 2017-06-28 DIAGNOSIS — N184 Chronic kidney disease, stage 4 (severe): Secondary | ICD-10-CM | POA: Diagnosis not present

## 2017-06-28 DIAGNOSIS — R2681 Unsteadiness on feet: Secondary | ICD-10-CM | POA: Diagnosis not present

## 2017-06-28 DIAGNOSIS — Z8669 Personal history of other diseases of the nervous system and sense organs: Secondary | ICD-10-CM | POA: Diagnosis not present

## 2017-06-28 DIAGNOSIS — Z89422 Acquired absence of other left toe(s): Secondary | ICD-10-CM | POA: Diagnosis not present

## 2017-06-28 DIAGNOSIS — D72829 Elevated white blood cell count, unspecified: Secondary | ICD-10-CM | POA: Diagnosis not present

## 2017-06-28 DIAGNOSIS — L97529 Non-pressure chronic ulcer of other part of left foot with unspecified severity: Secondary | ICD-10-CM | POA: Diagnosis not present

## 2017-06-28 DIAGNOSIS — B999 Unspecified infectious disease: Secondary | ICD-10-CM | POA: Diagnosis not present

## 2017-06-29 ENCOUNTER — Encounter: Payer: Self-pay | Admitting: Sports Medicine

## 2017-07-08 ENCOUNTER — Ambulatory Visit (INDEPENDENT_AMBULATORY_CARE_PROVIDER_SITE_OTHER): Payer: Medicare Other

## 2017-07-08 ENCOUNTER — Encounter: Payer: Self-pay | Admitting: Sports Medicine

## 2017-07-08 ENCOUNTER — Telehealth: Payer: Self-pay | Admitting: Sports Medicine

## 2017-07-08 ENCOUNTER — Ambulatory Visit: Payer: Self-pay | Admitting: Sports Medicine

## 2017-07-08 ENCOUNTER — Ambulatory Visit (INDEPENDENT_AMBULATORY_CARE_PROVIDER_SITE_OTHER): Payer: Medicare Other | Admitting: Sports Medicine

## 2017-07-08 VITALS — BP 125/63 | HR 63

## 2017-07-08 DIAGNOSIS — S98132A Complete traumatic amputation of one left lesser toe, initial encounter: Secondary | ICD-10-CM

## 2017-07-08 DIAGNOSIS — L97521 Non-pressure chronic ulcer of other part of left foot limited to breakdown of skin: Secondary | ICD-10-CM

## 2017-07-08 DIAGNOSIS — Z89422 Acquired absence of other left toe(s): Secondary | ICD-10-CM

## 2017-07-08 DIAGNOSIS — Z9889 Other specified postprocedural states: Secondary | ICD-10-CM

## 2017-07-08 DIAGNOSIS — E1142 Type 2 diabetes mellitus with diabetic polyneuropathy: Secondary | ICD-10-CM

## 2017-07-08 DIAGNOSIS — I739 Peripheral vascular disease, unspecified: Secondary | ICD-10-CM

## 2017-07-08 NOTE — Telephone Encounter (Signed)
Thanks will inform patient -Dr. Chauncey Cruel

## 2017-07-08 NOTE — Telephone Encounter (Signed)
This is Mickel Baas calling from [facility]. Pt was recently in the hospital and Dr. Cannon Kettle asked [place] to do an interventional consultation on the pt. I have spoken with the pt's wife who really has no interest in being involved with this. [facility] I've not been able to talk to the pt to see if he is interested in seeing [place] for a consultation. I know he has an appointment there today at 10:30. I just need someone to speak with him or get him in touch with me so we can try to get him in to see the internationalist. My number is [phone].

## 2017-07-08 NOTE — Progress Notes (Signed)
Subjective: Christian Garner is a 71 y.o. male patient seen today in office for POV #1 (DOS 06-24-17), S/P Left 5th toe + distal metatarsal amputation secondary to necrosis with osteomyelitis. Patient denies pain at surgical site, denies calf pain, denies headache, chest pain, shortness of breath, nausea, vomiting, fever, or chills. 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 No other issues noted.   Patient Active Problem List   Diagnosis Date Noted  . HFrEF (heart failure with reduced ejection fraction) ([place]) 06/07/2017  . CAD (coronary artery disease) 06/07/2017  . Recurrent pleural effusion on right   . S/P thoracentesis   . Acute renal failure with acute tubular necrosis superimposed on stage 2 chronic kidney disease ([place])   . Cellulitis of left lower extremity   . Goals of care, counseling/discussion   . Palliative care encounter   . Hyponatremia   . Pressure injury of skin 03/20/2017  . Acute on chronic combined systolic and diastolic CHF (congestive heart failure) ([place]) 03/19/2017  . Acute on chronic respiratory failure with hypoxia and hypercapnia ([place]) 03/19/2017  . AF (paroxysmal atrial fibrillation) ([place]) 03/19/2017  . Community acquired pneumonia 03/19/2017  . Pleural effusion   . AKI (acute kidney injury) [facility])     Current Outpatient Prescriptions on File Prior to Visit  Medication Sig Dispense Refill  . acetaminophen (TYLENOL) 325 MG tablet Take 975 mg by mouth at bedtime as needed for mild pain.     Marland Kitchen amiodarone (PACERONE) 200 MG tablet Take 1 tablet (200 mg total) by mouth 2 (two) times daily. 60 tablet 0  . atorvastatin (LIPITOR) 40 MG tablet Take 40 mg by mouth at bedtime.     . bismuth subsalicylate (PEPTO BISMOL) 262 MG/15ML suspension Take 30 mLs by mouth every 6 (six) hours as needed for indigestion.    . bumetanide (BUMEX) 2 MG tablet Take 2 tablets (4 mg total) by mouth 2 (two) times daily. 120 tablet 5  . calcium carbonate (TUMS - DOSED IN MG  ELEMENTAL CALCIUM) 500 MG chewable tablet Chew 1 tablet by mouth daily as needed for indigestion or heartburn.    . ferrous sulfate 325 (65 FE) MG tablet Take 325 mg by mouth daily with breakfast.    . fluticasone (FLONASE) 50 MCG/ACT nasal spray Place 1 spray into both nostrils daily as needed for allergies or rhinitis.    Marland Kitchen lisinopril (PRINIVIL,ZESTRIL) 2.5 MG tablet Take 2.5 mg by mouth daily.    . metolazone (ZAROXOLYN) 5 MG tablet Take 1 tablet (5 mg total) by mouth daily. 30 minutes before Bumex dose 30 tablet 5  . nitroGLYCERIN (NITROSTAT) 0.4 MG SL tablet Place 0.4 mg under the tongue every 5 (five) minutes as needed for chest pain.    . potassium chloride SA (K-DUR,KLOR-CON) 20 MEQ tablet Take 2 tablets (40 mEq total) by mouth 2 (two) times daily. 120 tablet 1  . sitaGLIPtin (JANUVIA) 100 MG tablet Take 100 mg by mouth daily.    . sodium chloride (OCEAN) 0.65 % SOLN nasal spray Place 2 sprays into both nostrils 3 (three) times daily as needed for congestion.    . traZODone (DESYREL) 50 MG tablet Take 50 mg by mouth at bedtime.    . vitamin B-12 (CYANOCOBALAMIN) 1000 MCG tablet Take 1,000 mcg by mouth daily.    Marland Kitchen warfarin (COUMADIN) 2 MG tablet Take 1 tablet (2 mg total) by mouth daily. 30 tablet 1   No current facility-administered medications on file prior to visit.  No Known Allergies  Objective: There were no vitals filed for this visit.  General: No acute distress, AAOx3  Left foot: Open amputation wound that measures 4x3cm with a fibro-granular base with clear drainage,  Minimal erythema, + edema, no warmth, no odor, partial thickness ulceration at left 2nd toe DIPJ with a fibrotic base that measures less than 0.5 cm does not probe to bone, no acute infection, Capillary fill time <3 seconds in all digits, gross sensation present via light touch to left foot however protective sensation absent. No pain or crepitation with range of motion left foot.  No pain with calf  compression. Status post left fifth toe and distal metatarsal amputation with open wound.  Post Op Xray, Left foot: Clean resection margins at amputation site of the fifth ray. Soft tissue swelling within normal limits for post op status. No other acute findings.  Assessment and Plan:  Problem List Items Addressed This Visit    None    Visit Diagnoses    Amputated toe of left foot ([place])    -  Primary   Relevant Orders   DG Foot Complete Left   PVD (peripheral vascular disease) ([place])       Diabetic polyneuropathy associated with type 2 diabetes mellitus (HCC)       S/P foot surgery, left       Toe ulcer, left, limited to breakdown of skin ([place])          -Patient seen and evaluated -Applied betadine to left second toe and Iodoform packing to amputation site, left foot secured with ACE wrap and stockinet  -Advised patient to make sure to keep dressings clean, dry, and intact to surgical site, allowing nursing staff to change dressing daily -Continue with oral antibiotics until completed -Advised patient to continue with post-op shoe on left foot and limited ambulation with assistance with PT -Patient to follow up with vascular for possible intervention. Patient has appointment on 07/12/2017. Encouraged patient to obtain this consultation to make sure that he has a no good circulation to help heal this area. -Will plan for continued postop wound care at next office visit. In the meantime, patient to call office if any issues or problems arise.   Landis Martins, DPM

## 2017-07-09 DIAGNOSIS — E039 Hypothyroidism, unspecified: Secondary | ICD-10-CM | POA: Diagnosis not present

## 2017-07-12 DIAGNOSIS — L97529 Non-pressure chronic ulcer of other part of left foot with unspecified severity: Secondary | ICD-10-CM | POA: Diagnosis not present

## 2017-07-12 DIAGNOSIS — L03116 Cellulitis of left lower limb: Secondary | ICD-10-CM | POA: Diagnosis not present

## 2017-07-12 DIAGNOSIS — Z89422 Acquired absence of other left toe(s): Secondary | ICD-10-CM | POA: Diagnosis not present

## 2017-07-12 DIAGNOSIS — E11621 Type 2 diabetes mellitus with foot ulcer: Secondary | ICD-10-CM | POA: Diagnosis not present

## 2017-07-12 DIAGNOSIS — S91301A Unspecified open wound, right foot, initial encounter: Secondary | ICD-10-CM | POA: Diagnosis not present

## 2017-07-12 DIAGNOSIS — Z8669 Personal history of other diseases of the nervous system and sense organs: Secondary | ICD-10-CM | POA: Diagnosis not present

## 2017-07-12 DIAGNOSIS — E1169 Type 2 diabetes mellitus with other specified complication: Secondary | ICD-10-CM | POA: Diagnosis not present

## 2017-07-12 DIAGNOSIS — M869 Osteomyelitis, unspecified: Secondary | ICD-10-CM | POA: Diagnosis not present

## 2017-07-12 DIAGNOSIS — N179 Acute kidney failure, unspecified: Secondary | ICD-10-CM | POA: Diagnosis not present

## 2017-07-12 DIAGNOSIS — I743 Embolism and thrombosis of arteries of the lower extremities: Secondary | ICD-10-CM | POA: Diagnosis not present

## 2017-07-18 DIAGNOSIS — Z7901 Long term (current) use of anticoagulants: Secondary | ICD-10-CM | POA: Diagnosis not present

## 2017-07-19 DIAGNOSIS — Z7901 Long term (current) use of anticoagulants: Secondary | ICD-10-CM | POA: Diagnosis not present

## 2017-07-19 DIAGNOSIS — I4891 Unspecified atrial fibrillation: Secondary | ICD-10-CM | POA: Diagnosis not present

## 2017-07-22 ENCOUNTER — Ambulatory Visit (INDEPENDENT_AMBULATORY_CARE_PROVIDER_SITE_OTHER): Payer: Medicare Other | Admitting: Sports Medicine

## 2017-07-22 DIAGNOSIS — L97521 Non-pressure chronic ulcer of other part of left foot limited to breakdown of skin: Secondary | ICD-10-CM

## 2017-07-22 DIAGNOSIS — Z9889 Other specified postprocedural states: Secondary | ICD-10-CM

## 2017-07-22 DIAGNOSIS — S98132A Complete traumatic amputation of one left lesser toe, initial encounter: Secondary | ICD-10-CM

## 2017-07-22 DIAGNOSIS — E1142 Type 2 diabetes mellitus with diabetic polyneuropathy: Secondary | ICD-10-CM

## 2017-07-22 DIAGNOSIS — Z89422 Acquired absence of other left toe(s): Secondary | ICD-10-CM

## 2017-07-22 DIAGNOSIS — I739 Peripheral vascular disease, unspecified: Secondary | ICD-10-CM

## 2017-07-22 NOTE — Progress Notes (Signed)
Subjective: Christian Garner is a 71 y.o. male patient seen today in office for POV #2 (DOS 06-24-17), S/P Left 5th toe + distal metatarsal amputation secondary to necrosis with osteomyelitis. Patient denies pain at surgical site, denies calf pain, denies headache, chest pain, shortness of breath, nausea, vomiting, fever, or chills. 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 No other issues noted.   Patient Active Problem List   Diagnosis Date Noted  . HFrEF (heart failure with reduced ejection fraction) ([place]) 06/07/2017  . CAD (coronary artery disease) 06/07/2017  . Recurrent pleural effusion on right   . S/P thoracentesis   . Acute renal failure with acute tubular necrosis superimposed on stage 2 chronic kidney disease ([place])   . Cellulitis of left lower extremity   . Goals of care, counseling/discussion   . Palliative care encounter   . Hyponatremia   . Pressure injury of skin 03/20/2017  . Acute on chronic combined systolic and diastolic CHF (congestive heart failure) ([place]) 03/19/2017  . Acute on chronic respiratory failure with hypoxia and hypercapnia ([place]) 03/19/2017  . AF (paroxysmal atrial fibrillation) ([place]) 03/19/2017  . Community acquired pneumonia 03/19/2017  . Pleural effusion   . AKI (acute kidney injury) [facility])     Current Outpatient Prescriptions on File Prior to Visit  Medication Sig Dispense Refill  . acetaminophen (TYLENOL) 325 MG tablet Take 975 mg by mouth at bedtime as needed for mild pain.     Marland Kitchen amiodarone (PACERONE) 200 MG tablet Take 1 tablet (200 mg total) by mouth 2 (two) times daily. 60 tablet 0  . atorvastatin (LIPITOR) 40 MG tablet Take 40 mg by mouth at bedtime.     . bismuth subsalicylate (PEPTO BISMOL) 262 MG/15ML suspension Take 30 mLs by mouth every 6 (six) hours as needed for indigestion.    . bumetanide (BUMEX) 2 MG tablet Take 2 tablets (4 mg total) by mouth 2 (two) times daily. 120 tablet 5  . calcium carbonate (TUMS - DOSED IN MG  ELEMENTAL CALCIUM) 500 MG chewable tablet Chew 1 tablet by mouth daily as needed for indigestion or heartburn.    . ferrous sulfate 325 (65 FE) MG tablet Take 325 mg by mouth daily with breakfast.    . fluticasone (FLONASE) 50 MCG/ACT nasal spray Place 1 spray into both nostrils daily as needed for allergies or rhinitis.    Marland Kitchen lisinopril (PRINIVIL,ZESTRIL) 2.5 MG tablet Take 2.5 mg by mouth daily.    . metolazone (ZAROXOLYN) 5 MG tablet Take 1 tablet (5 mg total) by mouth daily. 30 minutes before Bumex dose 30 tablet 5  . nitroGLYCERIN (NITROSTAT) 0.4 MG SL tablet Place 0.4 mg under the tongue every 5 (five) minutes as needed for chest pain.    . potassium chloride SA (K-DUR,KLOR-CON) 20 MEQ tablet Take 2 tablets (40 mEq total) by mouth 2 (two) times daily. 120 tablet 1  . sitaGLIPtin (JANUVIA) 100 MG tablet Take 100 mg by mouth daily.    . sodium chloride (OCEAN) 0.65 % SOLN nasal spray Place 2 sprays into both nostrils 3 (three) times daily as needed for congestion.    . traZODone (DESYREL) 50 MG tablet Take 50 mg by mouth at bedtime.    . vitamin B-12 (CYANOCOBALAMIN) 1000 MCG tablet Take 1,000 mcg by mouth daily.    Marland Kitchen warfarin (COUMADIN) 2 MG tablet Take 1 tablet (2 mg total) by mouth daily. 30 tablet 1   No current facility-administered medications on file prior to visit.  No Known Allergies  Objective: There were no vitals filed for this visit.  General: No acute distress, AAOx3  Left foot: Open amputation wound that measures 4x3cm with a fibro-granular base with clear drainage,  Minimal erythema, + edema, no warmth, no odor, partial thickness ulceration at left 2nd toe DIPJ with a fibrotic base that measures less than 0.1 cm does not probe to bone, no acute infection, Capillary fill time <3 seconds in all digits, gross sensation present via light touch to left foot however protective sensation absent. No pain or crepitation with range of motion left foot.  No pain with calf  compression. Status post left fifth toe and distal metatarsal amputation with open wound.  Assessment and Plan:  Problem List Items Addressed This Visit    None    Visit Diagnoses    Amputated toe of left foot ([place])    -  Primary   PVD (peripheral vascular disease) ([place])       Diabetic polyneuropathy associated with type 2 diabetes mellitus ([place])       S/P foot surgery, left       Toe ulcer, left, limited to breakdown of skin ([place])          -Patient seen and evaluated -Applied betadine to left second toe and Iodoform packing to amputation site, left foot secured with ACE wrap and stockinet  -Advised patient to make sure to keep dressings clean, dry, and intact to surgical site, allowing nursing staff to change dressing daily -Advised patient to continue with post-op shoe on left foot and limited ambulation with assistance with PT. May be discharged from Rehab once PT/OT approves -Will continue with close monitoring with vascular and will notify them if wound worsens -Will plan for continued postop wound care at next office visit. In the meantime, patient to call office if any issues or problems arise.   Landis Martins, DPM

## 2017-08-01 DIAGNOSIS — Z7984 Long term (current) use of oral hypoglycemic drugs: Secondary | ICD-10-CM | POA: Diagnosis not present

## 2017-08-01 DIAGNOSIS — Z87891 Personal history of nicotine dependence: Secondary | ICD-10-CM | POA: Diagnosis not present

## 2017-08-01 DIAGNOSIS — E11621 Type 2 diabetes mellitus with foot ulcer: Secondary | ICD-10-CM | POA: Diagnosis not present

## 2017-08-01 DIAGNOSIS — E1122 Type 2 diabetes mellitus with diabetic chronic kidney disease: Secondary | ICD-10-CM | POA: Diagnosis not present

## 2017-08-01 DIAGNOSIS — E1142 Type 2 diabetes mellitus with diabetic polyneuropathy: Secondary | ICD-10-CM | POA: Diagnosis not present

## 2017-08-01 DIAGNOSIS — N184 Chronic kidney disease, stage 4 (severe): Secondary | ICD-10-CM | POA: Diagnosis not present

## 2017-08-01 DIAGNOSIS — I482 Chronic atrial fibrillation: Secondary | ICD-10-CM | POA: Diagnosis not present

## 2017-08-01 DIAGNOSIS — I130 Hypertensive heart and chronic kidney disease with heart failure and stage 1 through stage 4 chronic kidney disease, or unspecified chronic kidney disease: Secondary | ICD-10-CM | POA: Diagnosis not present

## 2017-08-01 DIAGNOSIS — Z9981 Dependence on supplemental oxygen: Secondary | ICD-10-CM | POA: Diagnosis not present

## 2017-08-01 DIAGNOSIS — L97522 Non-pressure chronic ulcer of other part of left foot with fat layer exposed: Secondary | ICD-10-CM | POA: Diagnosis not present

## 2017-08-01 DIAGNOSIS — Z7901 Long term (current) use of anticoagulants: Secondary | ICD-10-CM | POA: Diagnosis not present

## 2017-08-01 DIAGNOSIS — I2510 Atherosclerotic heart disease of native coronary artery without angina pectoris: Secondary | ICD-10-CM | POA: Diagnosis not present

## 2017-08-01 DIAGNOSIS — I5040 Unspecified combined systolic (congestive) and diastolic (congestive) heart failure: Secondary | ICD-10-CM | POA: Diagnosis not present

## 2017-08-01 DIAGNOSIS — Z89422 Acquired absence of other left toe(s): Secondary | ICD-10-CM | POA: Diagnosis not present

## 2017-08-01 DIAGNOSIS — E1151 Type 2 diabetes mellitus with diabetic peripheral angiopathy without gangrene: Secondary | ICD-10-CM | POA: Diagnosis not present

## 2017-08-02 ENCOUNTER — Telehealth: Payer: Self-pay | Admitting: Sports Medicine

## 2017-08-02 NOTE — Telephone Encounter (Signed)
I informed Suanne Marker [facility] Dr. Marcene Duos the recommended [facility] visits for pt.

## 2017-08-02 NOTE — Telephone Encounter (Signed)
This is Suanne Marker, RN with [facility]. I went out to see pt and [facility] his [facility]th Left Toe amputated. We are doing dressing changes to that area. I need verbal orders to see him twice this week and then for three times a week for eight weeks. If you could please give me a call back at [phone]. Thank you very much.

## 2017-08-04 DIAGNOSIS — E1151 Type 2 diabetes mellitus with diabetic peripheral angiopathy without gangrene: Secondary | ICD-10-CM | POA: Diagnosis not present

## 2017-08-04 DIAGNOSIS — E1142 Type 2 diabetes mellitus with diabetic polyneuropathy: Secondary | ICD-10-CM | POA: Diagnosis not present

## 2017-08-04 DIAGNOSIS — Z7901 Long term (current) use of anticoagulants: Secondary | ICD-10-CM | POA: Diagnosis not present

## 2017-08-04 DIAGNOSIS — I130 Hypertensive heart and chronic kidney disease with heart failure and stage 1 through stage 4 chronic kidney disease, or unspecified chronic kidney disease: Secondary | ICD-10-CM | POA: Diagnosis not present

## 2017-08-04 DIAGNOSIS — E11621 Type 2 diabetes mellitus with foot ulcer: Secondary | ICD-10-CM | POA: Diagnosis not present

## 2017-08-04 DIAGNOSIS — I5040 Unspecified combined systolic (congestive) and diastolic (congestive) heart failure: Secondary | ICD-10-CM | POA: Diagnosis not present

## 2017-08-04 DIAGNOSIS — L97522 Non-pressure chronic ulcer of other part of left foot with fat layer exposed: Secondary | ICD-10-CM | POA: Diagnosis not present

## 2017-08-04 LAB — PROTIME-INR

## 2017-08-06 ENCOUNTER — Telehealth: Payer: Self-pay | Admitting: *Deleted

## 2017-08-06 ENCOUNTER — Ambulatory Visit (INDEPENDENT_AMBULATORY_CARE_PROVIDER_SITE_OTHER): Payer: Medicare Other | Admitting: Sports Medicine

## 2017-08-06 DIAGNOSIS — R197 Diarrhea, unspecified: Secondary | ICD-10-CM

## 2017-08-06 DIAGNOSIS — I739 Peripheral vascular disease, unspecified: Secondary | ICD-10-CM

## 2017-08-06 DIAGNOSIS — L97521 Non-pressure chronic ulcer of other part of left foot limited to breakdown of skin: Secondary | ICD-10-CM

## 2017-08-06 DIAGNOSIS — Z89422 Acquired absence of other left toe(s): Secondary | ICD-10-CM

## 2017-08-06 DIAGNOSIS — Z9889 Other specified postprocedural states: Secondary | ICD-10-CM

## 2017-08-06 DIAGNOSIS — E1142 Type 2 diabetes mellitus with diabetic polyneuropathy: Secondary | ICD-10-CM

## 2017-08-06 DIAGNOSIS — S98132A Complete traumatic amputation of one left lesser toe, initial encounter: Secondary | ICD-10-CM

## 2017-08-06 MED ORDER — DIPHENOXYLATE-ATROPINE 2.5-0.025 MG PO TABS: 1.0000 | ORAL_TABLET | Freq: Four times a day (QID) | ORAL | 0 refills | Status: DC | PRN

## 2017-08-06 NOTE — Progress Notes (Signed)
Subjective: Christian Garner is a 71 y.o. male patient seen today in office for POV #3 (DOS 06-24-17), S/P Left 5th toe + distal metatarsal amputation secondary to necrosis with osteomyelitis. Patient denies pain at surgical site, denies calf pain, denies headache, chest pain, shortness of breath, nausea, vomiting, fever, or chills. Admits diarrhea since Saturday, no longer on antibiotics; States he's not sure why. Flu negative. Patient is now at home and was discharged from [facility]. No other issues noted.   Patient Active Problem List   Diagnosis Date Noted  . HFrEF (heart failure with reduced ejection fraction) ([place]) 06/07/2017  . CAD (coronary artery disease) 06/07/2017  . Recurrent pleural effusion on right   . S/P thoracentesis   . Acute renal failure with acute tubular necrosis superimposed on stage 2 chronic kidney disease ([place])   . Cellulitis of left lower extremity   . Goals of care, counseling/discussion   . Palliative care encounter   . Hyponatremia   . Pressure injury of skin 03/20/2017  . Acute on chronic combined systolic and diastolic CHF (congestive heart failure) ([place]) 03/19/2017  . Acute on chronic respiratory failure with hypoxia and hypercapnia ([place]) 03/19/2017  . AF (paroxysmal atrial fibrillation) ([place]) 03/19/2017  . Community acquired pneumonia 03/19/2017  . Pleural effusion   . AKI (acute kidney injury) [facility])     Current Outpatient Prescriptions on File Prior to Visit  Medication Sig Dispense Refill  . acetaminophen (TYLENOL) 325 MG tablet Take 975 mg by mouth at bedtime as needed for mild pain.     Marland Kitchen amiodarone (PACERONE) 200 MG tablet Take 1 tablet (200 mg total) by mouth 2 (two) times daily. 60 tablet 0  . atorvastatin (LIPITOR) 40 MG tablet Take 40 mg by mouth at bedtime.     . bismuth subsalicylate (PEPTO BISMOL) 262 MG/15ML suspension Take 30 mLs by mouth every 6 (six) hours as needed for indigestion.    . bumetanide (BUMEX) 2 MG tablet Take 2 tablets  (4 mg total) by mouth 2 (two) times daily. 120 tablet 5  . calcium carbonate (TUMS - DOSED IN MG ELEMENTAL CALCIUM) 500 MG chewable tablet Chew 1 tablet by mouth daily as needed for indigestion or heartburn.    . ferrous sulfate 325 (65 FE) MG tablet Take 325 mg by mouth daily with breakfast.    . fluticasone (FLONASE) 50 MCG/ACT nasal spray Place 1 spray into both nostrils daily as needed for allergies or rhinitis.    Marland Kitchen lisinopril (PRINIVIL,ZESTRIL) 2.5 MG tablet Take 2.5 mg by mouth daily.    . metolazone (ZAROXOLYN) 5 MG tablet Take 1 tablet (5 mg total) by mouth daily. 30 minutes before Bumex dose 30 tablet 5  . nitroGLYCERIN (NITROSTAT) 0.4 MG SL tablet Place 0.4 mg under the tongue every 5 (five) minutes as needed for chest pain.    . potassium chloride SA (K-DUR,KLOR-CON) 20 MEQ tablet Take 2 tablets (40 mEq total) by mouth 2 (two) times daily. 120 tablet 1  . sitaGLIPtin (JANUVIA) 100 MG tablet Take 100 mg by mouth daily.    . sodium chloride (OCEAN) 0.65 % SOLN nasal spray Place 2 sprays into both nostrils 3 (three) times daily as needed for congestion.    . traZODone (DESYREL) 50 MG tablet Take 50 mg by mouth at bedtime.    . vitamin B-12 (CYANOCOBALAMIN) 1000 MCG tablet Take 1,000 mcg by mouth daily.    Marland Kitchen warfarin (COUMADIN) 2 MG tablet Take 1 tablet (2 mg total) by  mouth daily. 30 tablet 1   No current facility-administered medications on file prior to visit.     No Known Allergies  Objective: There were no vitals filed for this visit.  General: No acute distress, AAOx3  Left foot: Open amputation wound that measures 3.5x2.5 cm with a fibro-granular base with no drainage,  Minimal erythema, + edema, no warmth, no odor, healed 2nd toe ulceration, no acute infection, Capillary fill time <3 seconds in all digits, gross sensation present via light touch to left foot however protective sensation absent. No pain or crepitation with range of motion left foot.  No pain with calf  compression. Status post left fifth toe and distal metatarsal amputation with open wound.  Assessment and Plan:  Problem List Items Addressed This Visit    None    Visit Diagnoses    Toe ulcer, left, limited to breakdown of skin ([place])    -  Primary   resolved   Amputated toe of left foot (HCC)       Diarrhea, unspecified type       Relevant Medications   diphenoxylate-atropine (LOMOTIL) 2.5-0.025 MG tablet   PVD (peripheral vascular disease) ([place])       Diabetic polyneuropathy associated with type 2 diabetes mellitus (HCC)       S/P foot surgery, left       Foot ulcer, limited to breakdown of skin, left ([place])       open amp wound       -Patient seen and evaluated -Applied PRISMA to amputation site, left foot secured with ACE wrap and stockinet  -Nursing orders sent  -Advised patient to make sure to keep dressings clean, dry, and intact to surgical site, allowing nursing staff to change dressing 3x/wk. -Advised patient to continue with post-op shoe on left foot and limited ambulation with assistance with PT at home -Will continue with close monitoring with vascular and will notify them if wound worsens -Rx Lomotil for diarrhea and advised ER if worsens  -Will plan for continued postop wound care at next office visit. In the meantime, patient to call office if any issues or problems arise.   Landis Martins, DPM

## 2017-08-06 NOTE — Telephone Encounter (Addendum)
-----   Message from [place], [place] sent at 08/06/2017 12:01 PM EDT ----- Regarding: [facility] Update wound care orders Wound care left foot open amputation site wound apply Prisma or similar collagen dressing 3x per week covered with dry dressing and ace wrap. -Dr. Cannon Kettle. Copy of orders sent to [facility], [place].

## 2017-08-06 NOTE — Telephone Encounter (Addendum)
-----   Message from [place], [place] sent at 08/06/2017 12:01 PM EDT ----- Regarding: [facility] Update wound care orders Wound care left foot open amputation site wound apply Prisma or similar collagen dressing 3x per week covered with dry dressing and ace wrap. -Dr. Cannon Kettle. Orders faxed to [facility].

## 2017-08-09 DIAGNOSIS — I130 Hypertensive heart and chronic kidney disease with heart failure and stage 1 through stage 4 chronic kidney disease, or unspecified chronic kidney disease: Secondary | ICD-10-CM | POA: Diagnosis not present

## 2017-08-09 DIAGNOSIS — E1151 Type 2 diabetes mellitus with diabetic peripheral angiopathy without gangrene: Secondary | ICD-10-CM | POA: Diagnosis not present

## 2017-08-09 DIAGNOSIS — L97522 Non-pressure chronic ulcer of other part of left foot with fat layer exposed: Secondary | ICD-10-CM | POA: Diagnosis not present

## 2017-08-09 DIAGNOSIS — E11621 Type 2 diabetes mellitus with foot ulcer: Secondary | ICD-10-CM | POA: Diagnosis not present

## 2017-08-09 DIAGNOSIS — E1142 Type 2 diabetes mellitus with diabetic polyneuropathy: Secondary | ICD-10-CM | POA: Diagnosis not present

## 2017-08-09 DIAGNOSIS — I5040 Unspecified combined systolic (congestive) and diastolic (congestive) heart failure: Secondary | ICD-10-CM | POA: Diagnosis not present

## 2017-08-10 DIAGNOSIS — E1142 Type 2 diabetes mellitus with diabetic polyneuropathy: Secondary | ICD-10-CM | POA: Diagnosis not present

## 2017-08-10 DIAGNOSIS — E11621 Type 2 diabetes mellitus with foot ulcer: Secondary | ICD-10-CM | POA: Diagnosis not present

## 2017-08-10 DIAGNOSIS — L97522 Non-pressure chronic ulcer of other part of left foot with fat layer exposed: Secondary | ICD-10-CM | POA: Diagnosis not present

## 2017-08-10 DIAGNOSIS — E1151 Type 2 diabetes mellitus with diabetic peripheral angiopathy without gangrene: Secondary | ICD-10-CM | POA: Diagnosis not present

## 2017-08-10 DIAGNOSIS — I130 Hypertensive heart and chronic kidney disease with heart failure and stage 1 through stage 4 chronic kidney disease, or unspecified chronic kidney disease: Secondary | ICD-10-CM | POA: Diagnosis not present

## 2017-08-10 DIAGNOSIS — I5040 Unspecified combined systolic (congestive) and diastolic (congestive) heart failure: Secondary | ICD-10-CM | POA: Diagnosis not present

## 2017-08-10 DIAGNOSIS — I509 Heart failure, unspecified: Secondary | ICD-10-CM | POA: Diagnosis not present

## 2017-08-11 DIAGNOSIS — E1151 Type 2 diabetes mellitus with diabetic peripheral angiopathy without gangrene: Secondary | ICD-10-CM | POA: Diagnosis not present

## 2017-08-11 DIAGNOSIS — E11621 Type 2 diabetes mellitus with foot ulcer: Secondary | ICD-10-CM | POA: Diagnosis not present

## 2017-08-11 DIAGNOSIS — I5040 Unspecified combined systolic (congestive) and diastolic (congestive) heart failure: Secondary | ICD-10-CM | POA: Diagnosis not present

## 2017-08-11 DIAGNOSIS — I130 Hypertensive heart and chronic kidney disease with heart failure and stage 1 through stage 4 chronic kidney disease, or unspecified chronic kidney disease: Secondary | ICD-10-CM | POA: Diagnosis not present

## 2017-08-11 DIAGNOSIS — E1142 Type 2 diabetes mellitus with diabetic polyneuropathy: Secondary | ICD-10-CM | POA: Diagnosis not present

## 2017-08-11 DIAGNOSIS — L97522 Non-pressure chronic ulcer of other part of left foot with fat layer exposed: Secondary | ICD-10-CM | POA: Diagnosis not present

## 2017-08-12 DIAGNOSIS — I429 Cardiomyopathy, unspecified: Secondary | ICD-10-CM | POA: Diagnosis not present

## 2017-08-12 DIAGNOSIS — R197 Diarrhea, unspecified: Secondary | ICD-10-CM | POA: Diagnosis not present

## 2017-08-12 DIAGNOSIS — I509 Heart failure, unspecified: Secondary | ICD-10-CM | POA: Diagnosis not present

## 2017-08-12 DIAGNOSIS — N184 Chronic kidney disease, stage 4 (severe): Secondary | ICD-10-CM | POA: Diagnosis not present

## 2017-08-13 DIAGNOSIS — E1142 Type 2 diabetes mellitus with diabetic polyneuropathy: Secondary | ICD-10-CM | POA: Diagnosis not present

## 2017-08-13 DIAGNOSIS — L97522 Non-pressure chronic ulcer of other part of left foot with fat layer exposed: Secondary | ICD-10-CM | POA: Diagnosis not present

## 2017-08-13 DIAGNOSIS — I5040 Unspecified combined systolic (congestive) and diastolic (congestive) heart failure: Secondary | ICD-10-CM | POA: Diagnosis not present

## 2017-08-13 DIAGNOSIS — E11621 Type 2 diabetes mellitus with foot ulcer: Secondary | ICD-10-CM | POA: Diagnosis not present

## 2017-08-13 DIAGNOSIS — E1151 Type 2 diabetes mellitus with diabetic peripheral angiopathy without gangrene: Secondary | ICD-10-CM | POA: Diagnosis not present

## 2017-08-13 DIAGNOSIS — I130 Hypertensive heart and chronic kidney disease with heart failure and stage 1 through stage 4 chronic kidney disease, or unspecified chronic kidney disease: Secondary | ICD-10-CM | POA: Diagnosis not present

## 2017-08-16 DIAGNOSIS — E1151 Type 2 diabetes mellitus with diabetic peripheral angiopathy without gangrene: Secondary | ICD-10-CM | POA: Diagnosis not present

## 2017-08-16 DIAGNOSIS — L97522 Non-pressure chronic ulcer of other part of left foot with fat layer exposed: Secondary | ICD-10-CM | POA: Diagnosis not present

## 2017-08-16 DIAGNOSIS — I5040 Unspecified combined systolic (congestive) and diastolic (congestive) heart failure: Secondary | ICD-10-CM | POA: Diagnosis not present

## 2017-08-16 DIAGNOSIS — E11621 Type 2 diabetes mellitus with foot ulcer: Secondary | ICD-10-CM | POA: Diagnosis not present

## 2017-08-16 DIAGNOSIS — E1142 Type 2 diabetes mellitus with diabetic polyneuropathy: Secondary | ICD-10-CM | POA: Diagnosis not present

## 2017-08-16 DIAGNOSIS — I130 Hypertensive heart and chronic kidney disease with heart failure and stage 1 through stage 4 chronic kidney disease, or unspecified chronic kidney disease: Secondary | ICD-10-CM | POA: Diagnosis not present

## 2017-08-18 DIAGNOSIS — I130 Hypertensive heart and chronic kidney disease with heart failure and stage 1 through stage 4 chronic kidney disease, or unspecified chronic kidney disease: Secondary | ICD-10-CM | POA: Diagnosis not present

## 2017-08-18 DIAGNOSIS — I5040 Unspecified combined systolic (congestive) and diastolic (congestive) heart failure: Secondary | ICD-10-CM | POA: Diagnosis not present

## 2017-08-18 DIAGNOSIS — E1151 Type 2 diabetes mellitus with diabetic peripheral angiopathy without gangrene: Secondary | ICD-10-CM | POA: Diagnosis not present

## 2017-08-18 DIAGNOSIS — Z79899 Other long term (current) drug therapy: Secondary | ICD-10-CM | POA: Diagnosis not present

## 2017-08-18 DIAGNOSIS — E11621 Type 2 diabetes mellitus with foot ulcer: Secondary | ICD-10-CM | POA: Diagnosis not present

## 2017-08-18 DIAGNOSIS — E1142 Type 2 diabetes mellitus with diabetic polyneuropathy: Secondary | ICD-10-CM | POA: Diagnosis not present

## 2017-08-18 DIAGNOSIS — L97522 Non-pressure chronic ulcer of other part of left foot with fat layer exposed: Secondary | ICD-10-CM | POA: Diagnosis not present

## 2017-08-20 ENCOUNTER — Encounter: Payer: Self-pay | Admitting: Sports Medicine

## 2017-08-20 ENCOUNTER — Ambulatory Visit (INDEPENDENT_AMBULATORY_CARE_PROVIDER_SITE_OTHER): Payer: Medicare Other | Admitting: Sports Medicine

## 2017-08-20 DIAGNOSIS — E1142 Type 2 diabetes mellitus with diabetic polyneuropathy: Secondary | ICD-10-CM

## 2017-08-20 DIAGNOSIS — I739 Peripheral vascular disease, unspecified: Secondary | ICD-10-CM

## 2017-08-20 DIAGNOSIS — S98132A Complete traumatic amputation of one left lesser toe, initial encounter: Secondary | ICD-10-CM

## 2017-08-20 DIAGNOSIS — Z89422 Acquired absence of other left toe(s): Secondary | ICD-10-CM

## 2017-08-20 DIAGNOSIS — L97521 Non-pressure chronic ulcer of other part of left foot limited to breakdown of skin: Secondary | ICD-10-CM

## 2017-08-20 DIAGNOSIS — Z9889 Other specified postprocedural states: Secondary | ICD-10-CM

## 2017-08-20 NOTE — Progress Notes (Signed)
Subjective: Christian Garner is a 71 y.o. male patient seen today in office for POV #4 (DOS 06-24-17), S/P Left 5th toe + distal metatarsal amputation secondary to necrosis with osteomyelitis. Patient denies pain at surgical site, denies calf pain, denies headache, chest pain, shortness of breath, nausea, vomiting, fever, or chills.. Patient is at home. No other issues noted.   Patient assisted by wife and daughter this visit.   Patient Active Problem List   Diagnosis Date Noted  . HFrEF (heart failure with reduced ejection fraction) ([place]) 06/07/2017  . CAD (coronary artery disease) 06/07/2017  . Recurrent pleural effusion on right   . S/P thoracentesis   . Acute renal failure with acute tubular necrosis superimposed on stage 2 chronic kidney disease ([place])   . Cellulitis of left lower extremity   . Goals of care, counseling/discussion   . Palliative care encounter   . Hyponatremia   . Pressure injury of skin 03/20/2017  . Acute on chronic combined systolic and diastolic CHF (congestive heart failure) ([place]) 03/19/2017  . Acute on chronic respiratory failure with hypoxia and hypercapnia ([place]) 03/19/2017  . AF (paroxysmal atrial fibrillation) ([place]) 03/19/2017  . Community acquired pneumonia 03/19/2017  . Pleural effusion   . AKI (acute kidney injury) [facility])     Current Outpatient Medications on File Prior to Visit  Medication Sig Dispense Refill  . acetaminophen (TYLENOL) 325 MG tablet Take 975 mg by mouth at bedtime as needed for mild pain.     Marland Kitchen amiodarone (PACERONE) 200 MG tablet Take 1 tablet (200 mg total) by mouth 2 (two) times daily. 60 tablet 0  . atorvastatin (LIPITOR) 40 MG tablet Take 40 mg by mouth at bedtime.     . bismuth subsalicylate (PEPTO BISMOL) 262 MG/15ML suspension Take 30 mLs by mouth every 6 (six) hours as needed for indigestion.    . bumetanide (BUMEX) 2 MG tablet Take 2 tablets (4 mg total) by mouth 2 (two) times daily. 120 tablet 5  . calcium carbonate (TUMS -  DOSED IN MG ELEMENTAL CALCIUM) 500 MG chewable tablet Chew 1 tablet by mouth daily as needed for indigestion or heartburn.    . diphenoxylate-atropine (LOMOTIL) 2.5-0.025 MG tablet Take 1 tablet by mouth 4 (four) times daily as needed for diarrhea or loose stools. 30 tablet 0  . ferrous sulfate 325 (65 FE) MG tablet Take 325 mg by mouth daily with breakfast.    . fluticasone (FLONASE) 50 MCG/ACT nasal spray Place 1 spray into both nostrils daily as needed for allergies or rhinitis.    Marland Kitchen lisinopril (PRINIVIL,ZESTRIL) 2.5 MG tablet Take 2.5 mg by mouth daily.    . metolazone (ZAROXOLYN) 5 MG tablet Take 1 tablet (5 mg total) by mouth daily. 30 minutes before Bumex dose 30 tablet 5  . nitroGLYCERIN (NITROSTAT) 0.4 MG SL tablet Place 0.4 mg under the tongue every 5 (five) minutes as needed for chest pain.    . potassium chloride SA (K-DUR,KLOR-CON) 20 MEQ tablet Take 2 tablets (40 mEq total) by mouth 2 (two) times daily. 120 tablet 1  . sitaGLIPtin (JANUVIA) 100 MG tablet Take 100 mg by mouth daily.    . sodium chloride (OCEAN) 0.65 % SOLN nasal spray Place 2 sprays into both nostrils 3 (three) times daily as needed for congestion.    . traZODone (DESYREL) 50 MG tablet Take 50 mg by mouth at bedtime.    . vitamin B-12 (CYANOCOBALAMIN) 1000 MCG tablet Take 1,000 mcg by mouth daily.    Marland Kitchen  warfarin (COUMADIN) 2 MG tablet Take 1 tablet (2 mg total) by mouth daily. 30 tablet 1   No current facility-administered medications on file prior to visit.     No Known Allergies  Objective: There were no vitals filed for this visit.  General: No acute distress, AAOx3  Left foot: Open amputation wound that measures 3.3x2.3cm with a fibro-granular base with no drainage,  Minimal erythema, + edema, no warmth, no odor, healed 2nd toe ulceration, abrasion to dorsal midfoot, no acute infection, Capillary fill time <3 seconds in all digits, gross sensation present via light touch to left foot however protective  sensation absent. No pain or crepitation with range of motion left foot.  No pain with calf compression. Status post left fifth toe and distal metatarsal amputation with open wound.  Assessment and Plan:  Problem List Items Addressed This Visit    None    Visit Diagnoses    Toe ulcer, left, limited to breakdown of skin (HCC)    -  Primary   Amputated toe of left foot ([place])       PVD (peripheral vascular disease) ([place])       Diabetic polyneuropathy associated with type 2 diabetes mellitus ([place])       S/P foot surgery, left          -Patient seen and evaluated -Cleansed ulceration and applied PRISMA and a small piece of oasis 3x7cm tri-layer matrix  Lot [id number] to amputation site and protective adaptic to dorsal midfoot abrasion on left foot secured with ACE wrap and stockinet  -Advised patient to make sure to keep dressings clean, dry, and intact to surgical site, allowing nursing staff to change dressing 3x/wk. -Advised patient to continue with post-op shoe on left foot and limited ambulation with assistance with PT at home -Will continue with close monitoring with vascular and will notify them if wound worsens -Will plan for continued postop wound care at next office visit. In the meantime, patient to call office if any issues or problems arise.   Landis Martins, DPM

## 2017-08-23 DIAGNOSIS — E1151 Type 2 diabetes mellitus with diabetic peripheral angiopathy without gangrene: Secondary | ICD-10-CM | POA: Diagnosis not present

## 2017-08-23 DIAGNOSIS — I130 Hypertensive heart and chronic kidney disease with heart failure and stage 1 through stage 4 chronic kidney disease, or unspecified chronic kidney disease: Secondary | ICD-10-CM | POA: Diagnosis not present

## 2017-08-23 DIAGNOSIS — E1142 Type 2 diabetes mellitus with diabetic polyneuropathy: Secondary | ICD-10-CM | POA: Diagnosis not present

## 2017-08-23 DIAGNOSIS — I5040 Unspecified combined systolic (congestive) and diastolic (congestive) heart failure: Secondary | ICD-10-CM | POA: Diagnosis not present

## 2017-08-23 DIAGNOSIS — L97522 Non-pressure chronic ulcer of other part of left foot with fat layer exposed: Secondary | ICD-10-CM | POA: Diagnosis not present

## 2017-08-23 DIAGNOSIS — E11621 Type 2 diabetes mellitus with foot ulcer: Secondary | ICD-10-CM | POA: Diagnosis not present

## 2017-08-25 DIAGNOSIS — E11621 Type 2 diabetes mellitus with foot ulcer: Secondary | ICD-10-CM | POA: Diagnosis not present

## 2017-08-25 DIAGNOSIS — L97522 Non-pressure chronic ulcer of other part of left foot with fat layer exposed: Secondary | ICD-10-CM | POA: Diagnosis not present

## 2017-08-25 DIAGNOSIS — E1142 Type 2 diabetes mellitus with diabetic polyneuropathy: Secondary | ICD-10-CM | POA: Diagnosis not present

## 2017-08-25 DIAGNOSIS — I5040 Unspecified combined systolic (congestive) and diastolic (congestive) heart failure: Secondary | ICD-10-CM | POA: Diagnosis not present

## 2017-08-25 DIAGNOSIS — I130 Hypertensive heart and chronic kidney disease with heart failure and stage 1 through stage 4 chronic kidney disease, or unspecified chronic kidney disease: Secondary | ICD-10-CM | POA: Diagnosis not present

## 2017-08-25 DIAGNOSIS — E1151 Type 2 diabetes mellitus with diabetic peripheral angiopathy without gangrene: Secondary | ICD-10-CM | POA: Diagnosis not present

## 2017-08-26 DIAGNOSIS — I509 Heart failure, unspecified: Secondary | ICD-10-CM | POA: Diagnosis not present

## 2017-08-27 DIAGNOSIS — E1142 Type 2 diabetes mellitus with diabetic polyneuropathy: Secondary | ICD-10-CM | POA: Diagnosis not present

## 2017-08-27 DIAGNOSIS — I130 Hypertensive heart and chronic kidney disease with heart failure and stage 1 through stage 4 chronic kidney disease, or unspecified chronic kidney disease: Secondary | ICD-10-CM | POA: Diagnosis not present

## 2017-08-27 DIAGNOSIS — L97522 Non-pressure chronic ulcer of other part of left foot with fat layer exposed: Secondary | ICD-10-CM | POA: Diagnosis not present

## 2017-08-27 DIAGNOSIS — E11621 Type 2 diabetes mellitus with foot ulcer: Secondary | ICD-10-CM | POA: Diagnosis not present

## 2017-08-27 DIAGNOSIS — I5040 Unspecified combined systolic (congestive) and diastolic (congestive) heart failure: Secondary | ICD-10-CM | POA: Diagnosis not present

## 2017-08-27 DIAGNOSIS — E1151 Type 2 diabetes mellitus with diabetic peripheral angiopathy without gangrene: Secondary | ICD-10-CM | POA: Diagnosis not present

## 2017-08-30 DIAGNOSIS — I5040 Unspecified combined systolic (congestive) and diastolic (congestive) heart failure: Secondary | ICD-10-CM | POA: Diagnosis not present

## 2017-08-30 DIAGNOSIS — E1142 Type 2 diabetes mellitus with diabetic polyneuropathy: Secondary | ICD-10-CM | POA: Diagnosis not present

## 2017-08-30 DIAGNOSIS — I130 Hypertensive heart and chronic kidney disease with heart failure and stage 1 through stage 4 chronic kidney disease, or unspecified chronic kidney disease: Secondary | ICD-10-CM | POA: Diagnosis not present

## 2017-08-30 DIAGNOSIS — E1151 Type 2 diabetes mellitus with diabetic peripheral angiopathy without gangrene: Secondary | ICD-10-CM | POA: Diagnosis not present

## 2017-08-30 DIAGNOSIS — L97522 Non-pressure chronic ulcer of other part of left foot with fat layer exposed: Secondary | ICD-10-CM | POA: Diagnosis not present

## 2017-08-30 DIAGNOSIS — E11621 Type 2 diabetes mellitus with foot ulcer: Secondary | ICD-10-CM | POA: Diagnosis not present

## 2017-09-01 DIAGNOSIS — E11621 Type 2 diabetes mellitus with foot ulcer: Secondary | ICD-10-CM | POA: Diagnosis not present

## 2017-09-01 DIAGNOSIS — L97522 Non-pressure chronic ulcer of other part of left foot with fat layer exposed: Secondary | ICD-10-CM | POA: Diagnosis not present

## 2017-09-01 DIAGNOSIS — I5040 Unspecified combined systolic (congestive) and diastolic (congestive) heart failure: Secondary | ICD-10-CM | POA: Diagnosis not present

## 2017-09-01 DIAGNOSIS — E1142 Type 2 diabetes mellitus with diabetic polyneuropathy: Secondary | ICD-10-CM | POA: Diagnosis not present

## 2017-09-01 DIAGNOSIS — I130 Hypertensive heart and chronic kidney disease with heart failure and stage 1 through stage 4 chronic kidney disease, or unspecified chronic kidney disease: Secondary | ICD-10-CM | POA: Diagnosis not present

## 2017-09-01 DIAGNOSIS — E1151 Type 2 diabetes mellitus with diabetic peripheral angiopathy without gangrene: Secondary | ICD-10-CM | POA: Diagnosis not present

## 2017-09-03 DIAGNOSIS — L97522 Non-pressure chronic ulcer of other part of left foot with fat layer exposed: Secondary | ICD-10-CM | POA: Diagnosis not present

## 2017-09-03 DIAGNOSIS — I5040 Unspecified combined systolic (congestive) and diastolic (congestive) heart failure: Secondary | ICD-10-CM | POA: Diagnosis not present

## 2017-09-03 DIAGNOSIS — E1151 Type 2 diabetes mellitus with diabetic peripheral angiopathy without gangrene: Secondary | ICD-10-CM | POA: Diagnosis not present

## 2017-09-03 DIAGNOSIS — E1142 Type 2 diabetes mellitus with diabetic polyneuropathy: Secondary | ICD-10-CM | POA: Diagnosis not present

## 2017-09-03 DIAGNOSIS — E11621 Type 2 diabetes mellitus with foot ulcer: Secondary | ICD-10-CM | POA: Diagnosis not present

## 2017-09-03 DIAGNOSIS — I130 Hypertensive heart and chronic kidney disease with heart failure and stage 1 through stage 4 chronic kidney disease, or unspecified chronic kidney disease: Secondary | ICD-10-CM | POA: Diagnosis not present

## 2017-09-06 DIAGNOSIS — I130 Hypertensive heart and chronic kidney disease with heart failure and stage 1 through stage 4 chronic kidney disease, or unspecified chronic kidney disease: Secondary | ICD-10-CM | POA: Diagnosis not present

## 2017-09-06 DIAGNOSIS — E1142 Type 2 diabetes mellitus with diabetic polyneuropathy: Secondary | ICD-10-CM | POA: Diagnosis not present

## 2017-09-06 DIAGNOSIS — L97522 Non-pressure chronic ulcer of other part of left foot with fat layer exposed: Secondary | ICD-10-CM | POA: Diagnosis not present

## 2017-09-06 DIAGNOSIS — E11621 Type 2 diabetes mellitus with foot ulcer: Secondary | ICD-10-CM | POA: Diagnosis not present

## 2017-09-06 DIAGNOSIS — E1151 Type 2 diabetes mellitus with diabetic peripheral angiopathy without gangrene: Secondary | ICD-10-CM | POA: Diagnosis not present

## 2017-09-06 DIAGNOSIS — I5040 Unspecified combined systolic (congestive) and diastolic (congestive) heart failure: Secondary | ICD-10-CM | POA: Diagnosis not present

## 2017-09-08 DIAGNOSIS — I5040 Unspecified combined systolic (congestive) and diastolic (congestive) heart failure: Secondary | ICD-10-CM | POA: Diagnosis not present

## 2017-09-08 DIAGNOSIS — Z7901 Long term (current) use of anticoagulants: Secondary | ICD-10-CM | POA: Diagnosis not present

## 2017-09-08 DIAGNOSIS — I130 Hypertensive heart and chronic kidney disease with heart failure and stage 1 through stage 4 chronic kidney disease, or unspecified chronic kidney disease: Secondary | ICD-10-CM | POA: Diagnosis not present

## 2017-09-08 DIAGNOSIS — E1151 Type 2 diabetes mellitus with diabetic peripheral angiopathy without gangrene: Secondary | ICD-10-CM | POA: Diagnosis not present

## 2017-09-08 DIAGNOSIS — E11621 Type 2 diabetes mellitus with foot ulcer: Secondary | ICD-10-CM | POA: Diagnosis not present

## 2017-09-08 DIAGNOSIS — E1142 Type 2 diabetes mellitus with diabetic polyneuropathy: Secondary | ICD-10-CM | POA: Diagnosis not present

## 2017-09-08 DIAGNOSIS — L97522 Non-pressure chronic ulcer of other part of left foot with fat layer exposed: Secondary | ICD-10-CM | POA: Diagnosis not present

## 2017-09-10 DIAGNOSIS — I5040 Unspecified combined systolic (congestive) and diastolic (congestive) heart failure: Secondary | ICD-10-CM | POA: Diagnosis not present

## 2017-09-10 DIAGNOSIS — E1142 Type 2 diabetes mellitus with diabetic polyneuropathy: Secondary | ICD-10-CM | POA: Diagnosis not present

## 2017-09-10 DIAGNOSIS — L97522 Non-pressure chronic ulcer of other part of left foot with fat layer exposed: Secondary | ICD-10-CM | POA: Diagnosis not present

## 2017-09-10 DIAGNOSIS — I130 Hypertensive heart and chronic kidney disease with heart failure and stage 1 through stage 4 chronic kidney disease, or unspecified chronic kidney disease: Secondary | ICD-10-CM | POA: Diagnosis not present

## 2017-09-10 DIAGNOSIS — E1151 Type 2 diabetes mellitus with diabetic peripheral angiopathy without gangrene: Secondary | ICD-10-CM | POA: Diagnosis not present

## 2017-09-10 DIAGNOSIS — E11621 Type 2 diabetes mellitus with foot ulcer: Secondary | ICD-10-CM | POA: Diagnosis not present

## 2017-09-13 DIAGNOSIS — N170 Acute kidney failure with tubular necrosis: Secondary | ICD-10-CM | POA: Diagnosis present

## 2017-09-13 DIAGNOSIS — E1142 Type 2 diabetes mellitus with diabetic polyneuropathy: Secondary | ICD-10-CM | POA: Diagnosis not present

## 2017-09-13 DIAGNOSIS — I4891 Unspecified atrial fibrillation: Secondary | ICD-10-CM | POA: Diagnosis not present

## 2017-09-13 DIAGNOSIS — R079 Chest pain, unspecified: Secondary | ICD-10-CM | POA: Diagnosis not present

## 2017-09-13 DIAGNOSIS — E7800 Pure hypercholesterolemia, unspecified: Secondary | ICD-10-CM | POA: Diagnosis present

## 2017-09-13 DIAGNOSIS — A0472 Enterocolitis due to Clostridium difficile, not specified as recurrent: Secondary | ICD-10-CM | POA: Diagnosis not present

## 2017-09-13 DIAGNOSIS — I130 Hypertensive heart and chronic kidney disease with heart failure and stage 1 through stage 4 chronic kidney disease, or unspecified chronic kidney disease: Secondary | ICD-10-CM | POA: Diagnosis present

## 2017-09-13 DIAGNOSIS — J156 Pneumonia due to other aerobic Gram-negative bacteria: Secondary | ICD-10-CM | POA: Diagnosis not present

## 2017-09-13 DIAGNOSIS — I5040 Unspecified combined systolic (congestive) and diastolic (congestive) heart failure: Secondary | ICD-10-CM | POA: Diagnosis not present

## 2017-09-13 DIAGNOSIS — E871 Hypo-osmolality and hyponatremia: Secondary | ICD-10-CM | POA: Diagnosis not present

## 2017-09-13 DIAGNOSIS — I480 Paroxysmal atrial fibrillation: Secondary | ICD-10-CM | POA: Diagnosis present

## 2017-09-13 DIAGNOSIS — I2510 Atherosclerotic heart disease of native coronary artery without angina pectoris: Secondary | ICD-10-CM | POA: Diagnosis present

## 2017-09-13 DIAGNOSIS — L97522 Non-pressure chronic ulcer of other part of left foot with fat layer exposed: Secondary | ICD-10-CM | POA: Diagnosis not present

## 2017-09-13 DIAGNOSIS — R531 Weakness: Secondary | ICD-10-CM | POA: Diagnosis not present

## 2017-09-13 DIAGNOSIS — Z87891 Personal history of nicotine dependence: Secondary | ICD-10-CM | POA: Diagnosis not present

## 2017-09-13 DIAGNOSIS — Z955 Presence of coronary angioplasty implant and graft: Secondary | ICD-10-CM | POA: Diagnosis not present

## 2017-09-13 DIAGNOSIS — R404 Transient alteration of awareness: Secondary | ICD-10-CM | POA: Diagnosis not present

## 2017-09-13 DIAGNOSIS — R6521 Severe sepsis with septic shock: Secondary | ICD-10-CM | POA: Diagnosis present

## 2017-09-13 DIAGNOSIS — D500 Iron deficiency anemia secondary to blood loss (chronic): Secondary | ICD-10-CM | POA: Diagnosis not present

## 2017-09-13 DIAGNOSIS — K219 Gastro-esophageal reflux disease without esophagitis: Secondary | ICD-10-CM | POA: Diagnosis present

## 2017-09-13 DIAGNOSIS — A419 Sepsis, unspecified organism: Secondary | ICD-10-CM | POA: Diagnosis not present

## 2017-09-13 DIAGNOSIS — Z7901 Long term (current) use of anticoagulants: Secondary | ICD-10-CM | POA: Diagnosis not present

## 2017-09-13 DIAGNOSIS — R066 Hiccough: Secondary | ICD-10-CM | POA: Diagnosis present

## 2017-09-13 DIAGNOSIS — N183 Chronic kidney disease, stage 3 (moderate): Secondary | ICD-10-CM | POA: Diagnosis present

## 2017-09-13 DIAGNOSIS — D689 Coagulation defect, unspecified: Secondary | ICD-10-CM | POA: Diagnosis not present

## 2017-09-13 DIAGNOSIS — I5022 Chronic systolic (congestive) heart failure: Secondary | ICD-10-CM | POA: Diagnosis not present

## 2017-09-13 DIAGNOSIS — E1151 Type 2 diabetes mellitus with diabetic peripheral angiopathy without gangrene: Secondary | ICD-10-CM | POA: Diagnosis not present

## 2017-09-13 DIAGNOSIS — Z79899 Other long term (current) drug therapy: Secondary | ICD-10-CM | POA: Diagnosis not present

## 2017-09-13 DIAGNOSIS — L97428 Non-pressure chronic ulcer of left heel and midfoot with other specified severity: Secondary | ICD-10-CM | POA: Diagnosis present

## 2017-09-13 DIAGNOSIS — A4189 Other specified sepsis: Secondary | ICD-10-CM | POA: Diagnosis not present

## 2017-09-13 DIAGNOSIS — N179 Acute kidney failure, unspecified: Secondary | ICD-10-CM | POA: Diagnosis not present

## 2017-09-13 DIAGNOSIS — E11621 Type 2 diabetes mellitus with foot ulcer: Secondary | ICD-10-CM | POA: Diagnosis not present

## 2017-09-13 DIAGNOSIS — R197 Diarrhea, unspecified: Secondary | ICD-10-CM | POA: Diagnosis not present

## 2017-09-13 DIAGNOSIS — E875 Hyperkalemia: Secondary | ICD-10-CM | POA: Diagnosis present

## 2017-09-13 DIAGNOSIS — E1122 Type 2 diabetes mellitus with diabetic chronic kidney disease: Secondary | ICD-10-CM | POA: Diagnosis present

## 2017-09-16 ENCOUNTER — Inpatient Hospital Stay (HOSPITAL_COMMUNITY): Payer: Medicare Other

## 2017-09-16 ENCOUNTER — Inpatient Hospital Stay (HOSPITAL_COMMUNITY)
Admission: AD | Admit: 2017-09-16 | Discharge: 2017-10-01 | DRG: 871 | Disposition: A | Discharge status: Skilled Nursing Facility | Payer: Medicare Other | Source: Other Acute Inpatient Hospital | Attending: Family Medicine | Admitting: Family Medicine

## 2017-09-16 ENCOUNTER — Encounter (HOSPITAL_COMMUNITY): Payer: Self-pay | Admitting: Internal Medicine

## 2017-09-16 DIAGNOSIS — E440 Moderate protein-calorie malnutrition: Secondary | ICD-10-CM | POA: Diagnosis present

## 2017-09-16 DIAGNOSIS — A419 Sepsis, unspecified organism: Secondary | ICD-10-CM | POA: Diagnosis not present

## 2017-09-16 DIAGNOSIS — S98119A Complete traumatic amputation of unspecified great toe, initial encounter: Secondary | ICD-10-CM | POA: Diagnosis not present

## 2017-09-16 DIAGNOSIS — R109 Unspecified abdominal pain: Secondary | ICD-10-CM | POA: Diagnosis not present

## 2017-09-16 DIAGNOSIS — N183 Chronic kidney disease, stage 3 unspecified: Secondary | ICD-10-CM | POA: Diagnosis present

## 2017-09-16 DIAGNOSIS — B999 Unspecified infectious disease: Secondary | ICD-10-CM | POA: Diagnosis not present

## 2017-09-16 DIAGNOSIS — L98499 Non-pressure chronic ulcer of skin of other sites with unspecified severity: Secondary | ICD-10-CM | POA: Diagnosis not present

## 2017-09-16 DIAGNOSIS — I2781 Cor pulmonale (chronic): Secondary | ICD-10-CM | POA: Diagnosis present

## 2017-09-16 DIAGNOSIS — D631 Anemia in chronic kidney disease: Secondary | ICD-10-CM | POA: Diagnosis present

## 2017-09-16 DIAGNOSIS — A414 Sepsis due to anaerobes: Principal | ICD-10-CM | POA: Diagnosis present

## 2017-09-16 DIAGNOSIS — L89159 Pressure ulcer of sacral region, unspecified stage: Secondary | ICD-10-CM | POA: Diagnosis not present

## 2017-09-16 DIAGNOSIS — L97529 Non-pressure chronic ulcer of other part of left foot with unspecified severity: Secondary | ICD-10-CM | POA: Diagnosis present

## 2017-09-16 DIAGNOSIS — A0472 Enterocolitis due to Clostridium difficile, not specified as recurrent: Secondary | ICD-10-CM | POA: Diagnosis not present

## 2017-09-16 DIAGNOSIS — R197 Diarrhea, unspecified: Secondary | ICD-10-CM | POA: Diagnosis not present

## 2017-09-16 DIAGNOSIS — K625 Hemorrhage of anus and rectum: Secondary | ICD-10-CM | POA: Diagnosis not present

## 2017-09-16 DIAGNOSIS — R0600 Dyspnea, unspecified: Secondary | ICD-10-CM | POA: Diagnosis not present

## 2017-09-16 DIAGNOSIS — R6520 Severe sepsis without septic shock: Secondary | ICD-10-CM | POA: Diagnosis present

## 2017-09-16 DIAGNOSIS — E785 Hyperlipidemia, unspecified: Secondary | ICD-10-CM | POA: Diagnosis not present

## 2017-09-16 DIAGNOSIS — D649 Anemia, unspecified: Secondary | ICD-10-CM | POA: Diagnosis not present

## 2017-09-16 DIAGNOSIS — L8990 Pressure ulcer of unspecified site, unspecified stage: Secondary | ICD-10-CM | POA: Diagnosis present

## 2017-09-16 DIAGNOSIS — E1122 Type 2 diabetes mellitus with diabetic chronic kidney disease: Secondary | ICD-10-CM | POA: Diagnosis present

## 2017-09-16 DIAGNOSIS — Z7901 Long term (current) use of anticoagulants: Secondary | ICD-10-CM

## 2017-09-16 DIAGNOSIS — Z6830 Body mass index (BMI) 30.0-30.9, adult: Secondary | ICD-10-CM

## 2017-09-16 DIAGNOSIS — I130 Hypertensive heart and chronic kidney disease with heart failure and stage 1 through stage 4 chronic kidney disease, or unspecified chronic kidney disease: Secondary | ICD-10-CM | POA: Diagnosis present

## 2017-09-16 DIAGNOSIS — I2510 Atherosclerotic heart disease of native coronary artery without angina pectoris: Secondary | ICD-10-CM | POA: Diagnosis not present

## 2017-09-16 DIAGNOSIS — Z89422 Acquired absence of other left toe(s): Secondary | ICD-10-CM | POA: Diagnosis not present

## 2017-09-16 DIAGNOSIS — I482 Chronic atrial fibrillation: Secondary | ICD-10-CM | POA: Diagnosis present

## 2017-09-16 DIAGNOSIS — R262 Difficulty in walking, not elsewhere classified: Secondary | ICD-10-CM | POA: Diagnosis not present

## 2017-09-16 DIAGNOSIS — Z9981 Dependence on supplemental oxygen: Secondary | ICD-10-CM | POA: Diagnosis not present

## 2017-09-16 DIAGNOSIS — F0151 Vascular dementia with behavioral disturbance: Secondary | ICD-10-CM | POA: Diagnosis not present

## 2017-09-16 DIAGNOSIS — Z452 Encounter for adjustment and management of vascular access device: Secondary | ICD-10-CM | POA: Diagnosis not present

## 2017-09-16 DIAGNOSIS — Y748 Miscellaneous general hospital and personal-use devices associated with adverse incidents, not elsewhere classified: Secondary | ICD-10-CM

## 2017-09-16 DIAGNOSIS — Z79899 Other long term (current) drug therapy: Secondary | ICD-10-CM

## 2017-09-16 DIAGNOSIS — I83019 Varicose veins of right lower extremity with ulcer of unspecified site: Secondary | ICD-10-CM | POA: Diagnosis present

## 2017-09-16 DIAGNOSIS — I959 Hypotension, unspecified: Secondary | ICD-10-CM | POA: Diagnosis present

## 2017-09-16 DIAGNOSIS — R159 Full incontinence of feces: Secondary | ICD-10-CM | POA: Diagnosis not present

## 2017-09-16 DIAGNOSIS — R918 Other nonspecific abnormal finding of lung field: Secondary | ICD-10-CM | POA: Diagnosis not present

## 2017-09-16 DIAGNOSIS — Z8249 Family history of ischemic heart disease and other diseases of the circulatory system: Secondary | ICD-10-CM

## 2017-09-16 DIAGNOSIS — Z741 Need for assistance with personal care: Secondary | ICD-10-CM | POA: Diagnosis not present

## 2017-09-16 DIAGNOSIS — Z833 Family history of diabetes mellitus: Secondary | ICD-10-CM

## 2017-09-16 DIAGNOSIS — J9611 Chronic respiratory failure with hypoxia: Secondary | ICD-10-CM | POA: Diagnosis present

## 2017-09-16 DIAGNOSIS — M6281 Muscle weakness (generalized): Secondary | ICD-10-CM | POA: Diagnosis not present

## 2017-09-16 DIAGNOSIS — N179 Acute kidney failure, unspecified: Secondary | ICD-10-CM | POA: Diagnosis present

## 2017-09-16 DIAGNOSIS — M7989 Other specified soft tissue disorders: Secondary | ICD-10-CM | POA: Diagnosis not present

## 2017-09-16 DIAGNOSIS — E11621 Type 2 diabetes mellitus with foot ulcer: Secondary | ICD-10-CM | POA: Diagnosis present

## 2017-09-16 DIAGNOSIS — I5043 Acute on chronic combined systolic (congestive) and diastolic (congestive) heart failure: Secondary | ICD-10-CM | POA: Diagnosis not present

## 2017-09-16 DIAGNOSIS — N189 Chronic kidney disease, unspecified: Secondary | ICD-10-CM | POA: Diagnosis present

## 2017-09-16 DIAGNOSIS — L97919 Non-pressure chronic ulcer of unspecified part of right lower leg with unspecified severity: Secondary | ICD-10-CM | POA: Diagnosis present

## 2017-09-16 DIAGNOSIS — D62 Acute posthemorrhagic anemia: Secondary | ICD-10-CM | POA: Diagnosis present

## 2017-09-16 DIAGNOSIS — R5381 Other malaise: Secondary | ICD-10-CM | POA: Diagnosis present

## 2017-09-16 DIAGNOSIS — I480 Paroxysmal atrial fibrillation: Secondary | ICD-10-CM | POA: Diagnosis present

## 2017-09-16 DIAGNOSIS — L89152 Pressure ulcer of sacral region, stage 2: Secondary | ICD-10-CM | POA: Diagnosis present

## 2017-09-16 DIAGNOSIS — E46 Unspecified protein-calorie malnutrition: Secondary | ICD-10-CM | POA: Diagnosis present

## 2017-09-16 DIAGNOSIS — D600 Chronic acquired pure red cell aplasia: Secondary | ICD-10-CM | POA: Diagnosis not present

## 2017-09-16 DIAGNOSIS — E871 Hypo-osmolality and hyponatremia: Secondary | ICD-10-CM | POA: Diagnosis present

## 2017-09-16 DIAGNOSIS — R1110 Vomiting, unspecified: Secondary | ICD-10-CM

## 2017-09-16 DIAGNOSIS — N182 Chronic kidney disease, stage 2 (mild): Secondary | ICD-10-CM | POA: Diagnosis present

## 2017-09-16 DIAGNOSIS — E119 Type 2 diabetes mellitus without complications: Secondary | ICD-10-CM | POA: Diagnosis not present

## 2017-09-16 DIAGNOSIS — N170 Acute kidney failure with tubular necrosis: Secondary | ICD-10-CM | POA: Diagnosis present

## 2017-09-16 DIAGNOSIS — L97509 Non-pressure chronic ulcer of other part of unspecified foot with unspecified severity: Secondary | ICD-10-CM | POA: Diagnosis not present

## 2017-09-16 DIAGNOSIS — N184 Chronic kidney disease, stage 4 (severe): Secondary | ICD-10-CM

## 2017-09-16 DIAGNOSIS — J9610 Chronic respiratory failure, unspecified whether with hypoxia or hypercapnia: Secondary | ICD-10-CM | POA: Diagnosis not present

## 2017-09-16 DIAGNOSIS — R410 Disorientation, unspecified: Secondary | ICD-10-CM | POA: Diagnosis not present

## 2017-09-16 DIAGNOSIS — A0471 Enterocolitis due to Clostridium difficile, recurrent: Secondary | ICD-10-CM

## 2017-09-16 DIAGNOSIS — Z955 Presence of coronary angioplasty implant and graft: Secondary | ICD-10-CM

## 2017-09-16 DIAGNOSIS — E118 Type 2 diabetes mellitus with unspecified complications: Secondary | ICD-10-CM | POA: Diagnosis not present

## 2017-09-16 DIAGNOSIS — L03119 Cellulitis of unspecified part of limb: Secondary | ICD-10-CM | POA: Diagnosis not present

## 2017-09-16 DIAGNOSIS — F0150 Vascular dementia without behavioral disturbance: Secondary | ICD-10-CM | POA: Diagnosis not present

## 2017-09-16 DIAGNOSIS — K922 Gastrointestinal hemorrhage, unspecified: Secondary | ICD-10-CM | POA: Diagnosis not present

## 2017-09-16 DIAGNOSIS — R627 Adult failure to thrive: Secondary | ICD-10-CM | POA: Diagnosis present

## 2017-09-16 DIAGNOSIS — Z66 Do not resuscitate: Secondary | ICD-10-CM | POA: Diagnosis present

## 2017-09-16 DIAGNOSIS — F329 Major depressive disorder, single episode, unspecified: Secondary | ICD-10-CM | POA: Diagnosis not present

## 2017-09-16 DIAGNOSIS — I255 Ischemic cardiomyopathy: Secondary | ICD-10-CM | POA: Diagnosis present

## 2017-09-16 HISTORY — DX: Chronic kidney disease, stage 3 unspecified: N18.30

## 2017-09-16 HISTORY — DX: Chronic kidney disease, stage 3 (moderate): N18.3

## 2017-09-16 HISTORY — DX: Enterocolitis due to Clostridium difficile, recurrent: A04.71

## 2017-09-16 LAB — CBC WITH DIFFERENTIAL/PLATELET
BAND NEUTROPHILS: 6 %
BASOS ABS: 0 10*3/uL (ref 0.0–0.1)
BASOS PCT: 0 %
Blasts: 0 %
EOS ABS: 0 10*3/uL (ref 0.0–0.7)
EOS PCT: 0 %
HCT: 30.3 % — ABNORMAL LOW (ref 39.0–52.0)
Hemoglobin: 10.2 g/dL — ABNORMAL LOW (ref 13.0–17.0)
LYMPHS ABS: 1.2 10*3/uL (ref 0.7–4.0)
Lymphocytes Relative: 4 %
MCH: 30.2 pg (ref 26.0–34.0)
MCHC: 33.7 g/dL (ref 30.0–36.0)
MCV: 89.6 fL (ref 78.0–100.0)
METAMYELOCYTES PCT: 0 %
MONO ABS: 0.9 10*3/uL (ref 0.1–1.0)
MYELOCYTES: 0 %
Monocytes Relative: 3 %
NEUTROS PCT: 87 %
NRBC: 0 /100{WBCs}
Neutro Abs: 27.5 10*3/uL — ABNORMAL HIGH (ref 1.7–7.7)
Other: 0 %
PLATELETS: 293 10*3/uL (ref 150–400)
PROMYELOCYTES ABS: 0 %
RBC: 3.38 MIL/uL — ABNORMAL LOW (ref 4.22–5.81)
RDW: 18 % — AB (ref 11.5–15.5)
WBC: 29.6 10*3/uL — ABNORMAL HIGH (ref 4.0–10.5)

## 2017-09-16 LAB — GLUCOSE, CAPILLARY
GLUCOSE-CAPILLARY: 157 mg/dL — AB (ref 65–99)
Glucose-Capillary: 144 mg/dL — ABNORMAL HIGH (ref 65–99)
Glucose-Capillary: 134 mg/dL — ABNORMAL HIGH (ref 65–99)
Glucose-Capillary: 118 mg/dL — ABNORMAL HIGH (ref 65–99)

## 2017-09-16 LAB — URINALYSIS, MICROSCOPIC (REFLEX)

## 2017-09-16 LAB — COMPREHENSIVE METABOLIC PANEL
ALK PHOS: 144 U/L — AB (ref 38–126)
ALT: 12 U/L — ABNORMAL LOW (ref 17–63)
ANION GAP: 13 (ref 5–15)
AST: 19 U/L (ref 15–41)
Albumin: 1.4 g/dL — ABNORMAL LOW (ref 3.5–5.0)
BILIRUBIN TOTAL: 0.9 mg/dL (ref 0.3–1.2)
BUN: 90 mg/dL — AB (ref 6–20)
CALCIUM: 7.3 mg/dL — AB (ref 8.9–10.3)
CO2: 24 mmol/L (ref 22–32)
Chloride: 89 mmol/L — ABNORMAL LOW (ref 101–111)
Creatinine, Ser: 4.46 mg/dL — ABNORMAL HIGH (ref 0.61–1.24)
GFR calc Af Amer: 14 mL/min — ABNORMAL LOW (ref >60)
GFR, EST NON AFRICAN AMERICAN: 12 mL/min — AB (ref >60)
GLUCOSE: 165 mg/dL — AB (ref 65–99)
POTASSIUM: 4.2 mmol/L (ref 3.5–5.1)
Sodium: 126 mmol/L — ABNORMAL LOW (ref 135–145)
TOTAL PROTEIN: 4.5 g/dL — AB (ref 6.5–8.1)

## 2017-09-16 LAB — FERRITIN: Ferritin: 692 ng/mL — ABNORMAL HIGH (ref 24–336)

## 2017-09-16 LAB — C DIFFICILE QUICK SCREEN W PCR REFLEX
C DIFFICILE (CDIFF) TOXIN: NEGATIVE
C Diff antigen: POSITIVE — AB

## 2017-09-16 LAB — URINALYSIS, ROUTINE W REFLEX MICROSCOPIC
Bilirubin Urine: NEGATIVE
Glucose, UA: NEGATIVE mg/dL
Ketones, ur: NEGATIVE mg/dL
Leukocytes, UA: NEGATIVE
Nitrite: NEGATIVE
Protein, ur: NEGATIVE mg/dL
Specific Gravity, Urine: >1.03 — ABNORMAL HIGH (ref 1.005–1.030)
pH: 5 (ref 5.0–8.0)

## 2017-09-16 LAB — HEMOGLOBIN A1C
Hgb A1c MFr Bld: 6.1 % — ABNORMAL HIGH (ref 4.8–5.6)
Mean Plasma Glucose: 128.37 mg/dL

## 2017-09-16 LAB — HEPARIN LEVEL (UNFRACTIONATED): Heparin Unfractionated: 0.13 IU/mL — ABNORMAL LOW (ref 0.30–0.70)

## 2017-09-16 LAB — IRON AND TIBC
Iron: 24 ug/dL — ABNORMAL LOW (ref 45–182)
Saturation Ratios: 20 % (ref 17.9–39.5)
TIBC: 122 ug/dL — ABNORMAL LOW (ref 250–450)
UIBC: 98 ug/dL

## 2017-09-16 LAB — OSMOLALITY: Osmolality: 296 mosm/kg — ABNORMAL HIGH (ref 275–295)

## 2017-09-16 LAB — RETICULOCYTES
RBC.: 3.38 MIL/uL — AB (ref 4.22–5.81)
Retic Count, Absolute: 54.1 10*3/uL (ref 19.0–186.0)
Retic Ct Pct: 1.6 % (ref 0.4–3.1)

## 2017-09-16 LAB — PROTIME-INR
INR: 1.88
PROTHROMBIN TIME: 21.4 s — AB (ref 11.4–15.2)

## 2017-09-16 LAB — CLOSTRIDIUM DIFFICILE BY PCR: Toxigenic C. Difficile by PCR: POSITIVE — AB

## 2017-09-16 LAB — SODIUM, URINE, RANDOM: Sodium, Ur: <10 mmol/L

## 2017-09-16 LAB — PHOSPHORUS: Phosphorus: 6.3 mg/dL — ABNORMAL HIGH (ref 2.5–4.6)

## 2017-09-16 LAB — MRSA PCR SCREENING: MRSA BY PCR: POSITIVE — AB

## 2017-09-16 LAB — MAGNESIUM: MAGNESIUM: 2.1 mg/dL (ref 1.7–2.4)

## 2017-09-16 LAB — CREATININE, URINE, RANDOM: Creatinine, Urine: 172.75 mg/dL

## 2017-09-16 LAB — TSH: TSH: 3.464 u[IU]/mL (ref 0.350–4.500)

## 2017-09-16 LAB — OSMOLALITY, URINE: OSMOLALITY UR: 318 mosm/kg (ref 300–900)

## 2017-09-16 LAB — APTT: APTT: 38 s — AB (ref 24–36)

## 2017-09-16 MED ORDER — PANTOPRAZOLE SODIUM 20 MG PO TBEC
20.0000 mg | DELAYED_RELEASE_TABLET | Freq: Every day | ORAL | Status: DC
  Administered 2017-09-16 – 2017-09-23 (×8): 20 mg via ORAL
  Filled 2017-09-16 (×8): qty 1

## 2017-09-16 MED ORDER — SALINE SPRAY 0.65 % NA SOLN: 2.0000 | Freq: Three times a day (TID) | NASAL | Status: DC | PRN

## 2017-09-16 MED ORDER — FUROSEMIDE 10 MG/ML IJ SOLN
80.0000 mg | Freq: Once | INTRAMUSCULAR | Status: AC
  Administered 2017-09-16: 80 mg via INTRAVENOUS
  Filled 2017-09-16: qty 8

## 2017-09-16 MED ORDER — CALCIUM CARBONATE ANTACID 500 MG PO CHEW: 1.0000 | CHEWABLE_TABLET | Freq: Every day | ORAL | Status: DC | PRN

## 2017-09-16 MED ORDER — OXYCODONE HCL 5 MG PO TABS
5.0000 mg | ORAL_TABLET | ORAL | Status: DC | PRN
  Administered 2017-09-18 – 2017-10-01 (×9): 5 mg via ORAL
  Filled 2017-09-16 (×11): qty 1

## 2017-09-16 MED ORDER — BACLOFEN 1 MG/ML ORAL SUSPENSION
10.0000 mg | Freq: Three times a day (TID) | ORAL | Status: DC | PRN
  Filled 2017-09-16: qty 1

## 2017-09-16 MED ORDER — VANCOMYCIN 50 MG/ML ORAL SOLUTION
500.0000 mg | Freq: Four times a day (QID) | ORAL | Status: DC
  Administered 2017-09-16 – 2017-09-28 (×46): 500 mg via ORAL
  Filled 2017-09-16 (×55): qty 10

## 2017-09-16 MED ORDER — BISMUTH SUBSALICYLATE 262 MG/15ML PO SUSP: 30.0000 mL | Freq: Four times a day (QID) | ORAL | Status: DC | PRN

## 2017-09-16 MED ORDER — ORAL CARE MOUTH RINSE
15.0000 mL | Freq: Two times a day (BID) | OROMUCOSAL | Status: DC
  Administered 2017-09-16 – 2017-10-01 (×26): 15 mL via OROMUCOSAL

## 2017-09-16 MED ORDER — ALBUMIN HUMAN 25 % IV SOLN
12.5000 g | Freq: Once | INTRAVENOUS | Status: AC
  Administered 2017-09-17: 12.5 g via INTRAVENOUS
  Filled 2017-09-16: qty 50

## 2017-09-16 MED ORDER — SODIUM CHLORIDE 0.9 % IV SOLN: 250.0000 mL | INTRAVENOUS | Status: DC | PRN

## 2017-09-16 MED ORDER — SODIUM CHLORIDE 0.9% FLUSH
3.0000 mL | Freq: Two times a day (BID) | INTRAVENOUS | Status: DC
  Administered 2017-09-16 – 2017-09-24 (×14): 3 mL via INTRAVENOUS
  Administered 2017-09-24: 10 mL via INTRAVENOUS
  Administered 2017-09-25 – 2017-10-01 (×4): 3 mL via INTRAVENOUS

## 2017-09-16 MED ORDER — AMIODARONE HCL 200 MG PO TABS
200.0000 mg | ORAL_TABLET | Freq: Two times a day (BID) | ORAL | Status: DC
  Administered 2017-09-16 – 2017-10-01 (×31): 200 mg via ORAL
  Filled 2017-09-16 (×32): qty 1

## 2017-09-16 MED ORDER — ACETAMINOPHEN 325 MG PO TABS: 975.0000 mg | ORAL_TABLET | Freq: Every evening | ORAL | Status: DC | PRN

## 2017-09-16 MED ORDER — DIPHENOXYLATE-ATROPINE 2.5-0.025 MG PO TABS: 1.0000 | ORAL_TABLET | Freq: Four times a day (QID) | ORAL | Status: DC | PRN

## 2017-09-16 MED ORDER — ACETAMINOPHEN 650 MG RE SUPP: 650.0000 mg | Freq: Four times a day (QID) | RECTAL | Status: DC | PRN

## 2017-09-16 MED ORDER — PRO-STAT SUGAR FREE PO LIQD
30.0000 mL | Freq: Three times a day (TID) | ORAL | Status: DC
  Administered 2017-09-16 – 2017-09-28 (×30): 30 mL via ORAL
  Filled 2017-09-16 (×26): qty 30

## 2017-09-16 MED ORDER — SODIUM CHLORIDE 0.9% FLUSH
3.0000 mL | Freq: Two times a day (BID) | INTRAVENOUS | Status: DC
  Administered 2017-09-16 – 2017-09-17 (×2): 3 mL via INTRAVENOUS

## 2017-09-16 MED ORDER — METOLAZONE 5 MG PO TABS
5.0000 mg | ORAL_TABLET | Freq: Every day | ORAL | Status: DC
  Administered 2017-09-16: 5 mg via ORAL
  Filled 2017-09-16: qty 1

## 2017-09-16 MED ORDER — VITAMIN B-12 1000 MCG PO TABS
1000.0000 ug | ORAL_TABLET | Freq: Every day | ORAL | Status: DC
  Administered 2017-09-16 – 2017-10-01 (×16): 1000 ug via ORAL
  Filled 2017-09-16 (×16): qty 1

## 2017-09-16 MED ORDER — ONDANSETRON HCL 4 MG/2ML IJ SOLN
4.0000 mg | Freq: Four times a day (QID) | INTRAMUSCULAR | Status: DC | PRN
  Administered 2017-09-22 – 2017-09-29 (×4): 4 mg via INTRAVENOUS
  Filled 2017-09-16 (×4): qty 2

## 2017-09-16 MED ORDER — FERROUS SULFATE 325 (65 FE) MG PO TABS
325.0000 mg | ORAL_TABLET | Freq: Every day | ORAL | Status: DC
Start: 2017-09-17 — End: 2017-10-02
  Administered 2017-09-17 – 2017-10-01 (×14): 325 mg via ORAL
  Filled 2017-09-16 (×15): qty 1

## 2017-09-16 MED ORDER — HEPARIN (PORCINE) IN NACL 100-0.45 UNIT/ML-% IJ SOLN
1850.0000 [IU]/h | INTRAMUSCULAR | Status: DC
  Administered 2017-09-16: 1550 [IU]/h via INTRAVENOUS
  Administered 2017-09-17: 1850 [IU]/h via INTRAVENOUS
  Filled 2017-09-16 (×4): qty 250

## 2017-09-16 MED ORDER — ALBUMIN HUMAN 25 % IV SOLN
25.0000 g | Freq: Once | INTRAVENOUS | Status: AC
  Administered 2017-09-16: 25 g via INTRAVENOUS
  Filled 2017-09-16: qty 100

## 2017-09-16 MED ORDER — SODIUM CHLORIDE 0.9% FLUSH: 3.0000 mL | INTRAVENOUS | Status: DC | PRN

## 2017-09-16 MED ORDER — ONDANSETRON HCL 4 MG PO TABS: 4.0000 mg | ORAL_TABLET | Freq: Four times a day (QID) | ORAL | Status: DC | PRN

## 2017-09-16 MED ORDER — FLUTICASONE PROPIONATE 50 MCG/ACT NA SUSP: 1.0000 | Freq: Every day | NASAL | Status: DC | PRN

## 2017-09-16 MED ORDER — ACETAMINOPHEN 325 MG PO TABS
650.0000 mg | ORAL_TABLET | Freq: Four times a day (QID) | ORAL | Status: DC | PRN
  Administered 2017-09-24: 650 mg via ORAL
  Filled 2017-09-16: qty 2

## 2017-09-16 MED ORDER — FUROSEMIDE 10 MG/ML IJ SOLN
80.0000 mg | Freq: Once | INTRAMUSCULAR | Status: AC
  Administered 2017-09-17: 80 mg via INTRAVENOUS
  Filled 2017-09-16: qty 8

## 2017-09-16 MED ORDER — TRAZODONE HCL 50 MG PO TABS
50.0000 mg | ORAL_TABLET | Freq: Every day | ORAL | Status: DC
  Administered 2017-09-16 – 2017-09-30 (×15): 50 mg via ORAL
  Filled 2017-09-16 (×15): qty 1

## 2017-09-16 MED ORDER — BUMETANIDE 2 MG PO TABS
4.0000 mg | ORAL_TABLET | Freq: Two times a day (BID) | ORAL | Status: DC
  Filled 2017-09-16: qty 2

## 2017-09-16 MED ORDER — ATORVASTATIN CALCIUM 40 MG PO TABS
40.0000 mg | ORAL_TABLET | Freq: Every day | ORAL | Status: DC
  Administered 2017-09-16 – 2017-09-30 (×15): 40 mg via ORAL
  Filled 2017-09-16 (×15): qty 1

## 2017-09-16 MED ORDER — INSULIN ASPART 100 UNIT/ML ~~LOC~~ SOLN
0.0000 [IU] | Freq: Three times a day (TID) | SUBCUTANEOUS | Status: DC
  Administered 2017-09-16 – 2017-09-19 (×4): 1 [IU] via SUBCUTANEOUS
  Administered 2017-09-20: 2 [IU] via SUBCUTANEOUS
  Administered 2017-09-20 – 2017-09-22 (×4): 1 [IU] via SUBCUTANEOUS
  Administered 2017-09-23: 3 [IU] via SUBCUTANEOUS
  Administered 2017-09-23: 1 [IU] via SUBCUTANEOUS
  Administered 2017-09-23 – 2017-09-24 (×3): 2 [IU] via SUBCUTANEOUS
  Administered 2017-09-25: 1 [IU] via SUBCUTANEOUS
  Administered 2017-09-25 – 2017-09-28 (×5): 2 [IU] via SUBCUTANEOUS
  Administered 2017-09-29 (×2): 1 [IU] via SUBCUTANEOUS
  Administered 2017-09-29: 2 [IU] via SUBCUTANEOUS
  Administered 2017-10-01: 1 [IU] via SUBCUTANEOUS

## 2017-09-16 MED ORDER — NITROGLYCERIN 0.4 MG SL SUBL: 0.4000 mg | SUBLINGUAL_TABLET | SUBLINGUAL | Status: DC | PRN

## 2017-09-16 NOTE — Progress Notes (Signed)
Initial Nutrition Assessment  DOCUMENTATION CODES:   Obesity unspecified  INTERVENTION:   -30 ml Prostat TID, each supplement provides 100 kcals and 15 grams protein  NUTRITION DIAGNOSIS:   Increased nutrient needs related to wound healing as evidenced by estimated needs.  GOAL:   Patient will meet greater than or equal to 90% of their needs  MONITOR:   PO intake, Supplement acceptance, Labs, Weight trends, Skin, I & O's  REASON FOR ASSESSMENT:   Consult (renal)  ASSESSMENT:   Christian Garner is a 71 y.o. male with medical history significant of congestive heart failure with an EF of 30% on home oxygen, coronary artery disease status post percutaneous coronary intervention greater than 1 year ago per patient records, atrial fibrillation chronically anticoagulated with warfarin, chronic kidney disease with a baseline creatinine of 1.3 did require hemodialysis during admission in January 2018, diabetes type 2 with chronic diabetic foot ulcer and renal disease who presents in transfer from [facility] for evaluation of acute on chronic renal failure.  Pt admitted with renal failure.   Spoke with RN, who reports that pt was transferred from [facility] for nephrology evaluation; pt may need HD.   Pt assessed along side CWOCN; she confirmed pt with multiple areas of skin breakdown (stage II pressure injury on inner gluteal fold (likely moisture related), rt leg venous stasis ulcers, and stage II pressure injury on lt lateral foot).   Pt went in and out of coherence at time of visit. He reports he had a great appetite PTA- he usually eats 2 meals per day and his wife cooks for him at home. Pt shares he has "lost a lot of weight", however, this is not consistent with documented wt hx. Per wt hx, UBW around 240#. Suspect edema may be masking true fat and muscle wasting and wt loss.   Breakfast tray at time of visit was untouched.   Labs reviewed: Na: 126, Phos: 6.3,  CBGS: 157 (inpatient orders for glycemic control are 0-9 units insulin aspart TID).   NUTRITION - FOCUSED PHYSICAL EXAM:    Most Recent Value  Orbital Region  Mild depletion  Upper Arm Region  No depletion  Thoracic and Lumbar Region  No depletion  Buccal Region  No depletion  Temple Region  Mild depletion  Clavicle Bone Region  No depletion  Clavicle and Acromion Bone Region  No depletion  Scapular Bone Region  No depletion  Dorsal Hand  No depletion  Patellar Region  No depletion  Anterior Thigh Region  No depletion  Posterior Calf Region  No depletion  Edema (RD Assessment)  Moderate  Hair  Reviewed  Eyes  Reviewed  Mouth  Reviewed  Skin  Reviewed  Nails  Reviewed       Diet Order:  Diet renal with fluid restriction Fluid restriction: 1200 mL Fluid; Room service appropriate? Yes; Fluid consistency: Thin  EDUCATION NEEDS:   Not appropriate for education at this time  Skin:  Skin Assessment: Skin Integrity Issues: Skin Integrity Issues:: Stage II, Other (Comment), Stage III Stage II: inner gluteal fold Stage III: lt lateral foot Diabetic Ulcer: n/a Other: rt leg venous stasis ulcer  Last BM:  09/16/17  Height:   Ht Readings from Last 1 Encounters:  09/16/17 6' (1.829 m)    Weight:   Wt Readings from Last 1 Encounters:  09/16/17 247 lb 5.7 oz (112.2 kg)    Ideal Body Weight:  80.9 kg  BMI:  Body mass index is  33.55 kg/m.  Estimated Nutritional Needs:   Kcal:  2200-2400  Protein:  125-140 grams  Fluid:  1-1.5 L    Lusia Greis A. Jimmye Norman, RD, LDN, CDE Pager: [phone] After hours Pager: [phone]

## 2017-09-16 NOTE — H&P (Signed)
History and Physical    Christian Garner EPP:[id number] DOB: August 16, 1946 DOA: 09/16/2017  PCP: [name], MD  Patient coming from: [facility]  I have personally briefly reviewed patient's old medical records in [place]  Chief Complaint: Transferred from [facility] due to acute on chronic renal failure needs nephrology evaluation.  HPI: Christian Garner is a 71 y.o. male with medical history significant of congestive heart failure with an EF of 30% on home oxygen, coronary artery disease status post percutaneous coronary intervention greater than 1 year ago per patient records, atrial fibrillation chronically anticoagulated with warfarin, chronic kidney disease with a baseline creatinine of 1.3 did require hemodialysis during admission in January 2018, diabetes type 2 with chronic diabetic foot ulcer and renal disease who [facility] in [facility] for evaluation of acute on chronic renal failure.  At baseline the patient uses a walker but often a wheelchair at home.  He is chronically debilitated and cared for by his wife.  Patient presented to [facility] on December 3 acutely ill with evidence of sepsis.  He was found to have C. difficile colitis.  Pneumonia and other source of his of infection were ruled out at that facility.  He was in acute renal failure upon presentation and his renal function failed to improve.  Consultation was sought with Dr. Justin Mend per telephone and recommendation was made for transfer to our facility.  On initial presentation his INR was markedly elevated at 5.8 and stored to 8.4 on the day after admission.  At that point he received some treatment.  And on the day prior to transfer was back down to 2.5.  He is on anticoagulation due to atrial fibrillation.  And his primary cardiologist is Dr. Missy Sabins.    Today the patient is oriented to name only he sleeps through the entire evaluation opens his eyes occasionally but did  not speak to me.  He was able to tell the nurse his name.  The entirty of the history is obtained from the medical record, my discussion with the patient's nurse and documentation presented on transfer.   Review of Systems: Patient is unable to answer any review of systems questions due to acute medical illness and condition  Past Medical History:  Diagnosis Date  . CAD (coronary artery disease)   . Cellulitis and abscess of lower extremity   . CHF (congestive heart failure) (HCC)    EF 30%  . Chronic kidney disease, stage III (moderate) (HCC)   . DM (diabetes mellitus) ([place])     Past Surgical History:  Procedure Laterality Date  . CORONARY ANGIOPLASTY WITH STENT PLACEMENT    . HAND SURGERY     At [facility], around 2014     reports that he has quit smoking. he has never used smokeless tobacco. He reports that he does not drink alcohol or use drugs.  No Known Allergies  Family History  Problem Relation Age of Onset  . Hypertension Mother   . Diabetes Mother   . Bone cancer Mother   . Hypertension Father   . Bladder Cancer Sister      Prior to Admission medications   Medication Sig Start Date End Date Taking? Authorizing Provider  acetaminophen (TYLENOL) 325 MG tablet Take 975 mg by mouth at bedtime as needed for mild pain.     [provider]  amiodarone (PACERONE) 200 MG tablet Take 1 tablet (200 mg total) by mouth 2 (two) times  daily. 04/05/17   Allie Bossier, MD  atorvastatin (LIPITOR) 40 MG tablet Take 40 mg by mouth at bedtime.     [provider]  bismuth subsalicylate (PEPTO BISMOL) 262 MG/15ML suspension Take 30 mLs by mouth every 6 (six) hours as needed for indigestion.    [provider]  bumetanide (BUMEX) 2 MG tablet Take 2 tablets (4 mg total) by mouth 2 (two) times daily. 06/12/17   Tawny Asal, MD  calcium carbonate (TUMS - DOSED IN MG ELEMENTAL CALCIUM) 500 MG chewable tablet Chew 1 tablet by mouth daily as needed for  indigestion or heartburn.    [provider]  diphenoxylate-atropine (LOMOTIL) 2.5-0.025 MG tablet Take 1 tablet by mouth 4 (four) times daily as needed for diarrhea or loose stools. 08/06/17   Landis Martins, DPM  ferrous sulfate 325 (65 FE) MG tablet Take 325 mg by mouth daily with breakfast.    [provider]  fluticasone (FLONASE) 50 MCG/ACT nasal spray Place 1 spray into both nostrils daily as needed for allergies or rhinitis.    [provider]  lisinopril (PRINIVIL,ZESTRIL) 2.5 MG tablet Take 2.5 mg by mouth daily.    [provider]  metolazone (ZAROXOLYN) 5 MG tablet Take 1 tablet (5 mg total) by mouth daily. 30 minutes before Bumex dose 06/12/17   Tawny Asal, MD  nitroGLYCERIN (NITROSTAT) 0.4 MG SL tablet Place 0.4 mg under the tongue every 5 (five) minutes as needed for chest pain.    [provider]  potassium chloride SA (K-DUR,KLOR-CON) 20 MEQ tablet Take 2 tablets (40 mEq total) by mouth 2 (two) times daily. 06/12/17   Tawny Asal, MD  sitaGLIPtin (JANUVIA) 100 MG tablet Take 100 mg by mouth daily.    [provider]  sodium chloride (OCEAN) 0.65 % SOLN nasal spray Place 2 sprays into both nostrils 3 (three) times daily as needed for congestion.    [provider]  traZODone (DESYREL) 50 MG tablet Take 50 mg by mouth at bedtime.    [provider]  vitamin B-12 (CYANOCOBALAMIN) 1000 MCG tablet Take 1,000 mcg by mouth daily.    [provider]  warfarin (COUMADIN) 2 MG tablet Take 1 tablet (2 mg total) by mouth daily. 06/12/17   Tawny Asal, MD    Physical Exam: Vitals:   09/16/17 0712 09/16/17 0900  BP: (!) 108/50   Pulse: 73   Resp: 19   Temp:  98 F (36.7 C)  TempSrc:  Rectal  SpO2: 100%   Weight:  112.2 kg (247 lb 5.7 oz)  Height:  6' (1.829 m)    Constitutional: Lying in bed minimally responsive,  and opens eyes with deep stimulation. Vitals:   09/16/17 0712 09/16/17 0900  BP: (!)  108/50   Pulse: 73   Resp: 19   Temp:  98 F (36.7 C)  TempSrc:  Rectal  SpO2: 100%   Weight:  112.2 kg (247 lb 5.7 oz)  Height:  6' (1.829 m)   Eyes: PERRL, lids and conjunctivae normal ENMT: Mucous membranes are dry. Posterior pharynx clear of any exudate or lesions.Normal dentition.  Neck: normal, supple, no masses, no thyromegaly Respiratory: Rales at the bases bilaterally, no wheezing, no rhonchi.  Increased respiratory effort. No accessory muscle use.  Cardiovascular: Irregular rate and rhythm, no murmurs / rubs / gallops. extremity edema noted with anasarca to the hips. pedal pulses difficult to palpate capillary refill at 2 seconds on toes. No carotid bruits.  Abdomen: no  tenderness, no masses palpated. No hepatosplenomegaly. Bowel sounds positive.  Musculoskeletal: no clubbing / cyanosis. No joint deformity upper and lower extremities.  Poor ROM, no contractures.  Decreased muscle tone.  Skin: no rashes, lesions. No induration.  Left foot diabetic foot ulcer noted, stage II sacral ulcer with deep tissue injury noted, right lower extremity venous ulceration stasis noted  Neurologic: Sensation intact, DTR normal.  Oriented to name but not day, date, time or situation.  Psychiatric: Minimally responsive to voice.  Unable to fully assess   Labs on Admission: I have personally reviewed following labs and imaging studies  CBC:  On the day prior to transfer: White blood cell count 31, hemoglobin 10.7, hematocrit 32, platelet count 262, 71 segs, 24 bands, 3 lymphs, 2 monocytes, 29.55 absolute neutrophils, trace toxic granulations, MCV of 94.   Basic Metabolic Panel: Sodium 812, potassium 4.6, chloride 87, CO2 23, anion gap 17, BUN 83, creatinine 3.5, GFR estimated at 17, glucose 121, calculated OSM 262,  GFR: 17.  Liver Function Tests: On 12 3 AST 26, ALT 21, alk phos 126, total protein 5.3, albumin 2.4, Coagulation Profile: On 12 4 INR 8.4, on 12 5 PT 24.7, INR 2.5. Cardiac  Enzymes: Negative at outside facility BNP (last 3 results) 8400 on 12 3    HbA1C: No results from outside facility  CBG: Recent Labs  Lab 09/16/17 0921  GLUCAP 157*   Urine analysis: No results from outside facility  Radiological Exams on Admission: Portable Chest 1 View  Result Date: 09/16/2017 CLINICAL DATA:  Dyspnea.  Altered mental status. EXAM: PORTABLE CHEST 1 VIEW COMPARISON:  09/13/2017. FINDINGS: The heart is enlarged. There is worsening aeration, with increasing BILATERAL pulmonary opacities, BILATERAL pleural effusions, RIGHT greater than LEFT, likely to represent pulmonary edema. BILATERAL pneumonia is less favored. No osseous findings. IMPRESSION: Marked worsening aeration. Development of Cardiomegaly with BILATERAL pulmonary opacities, RIGHT greater than LEFT, likely pulmonary edema. Electronically Signed   By: Staci Righter M.D.   On: 09/16/2017 09:53    EKG: Independently reviewed.  From outside facility on 12 3 showed sinus rhythm with a first-degree AV block with fusion complexes. On telemetry here the patient is in atrial fibrillation at a rate of 65 bpm.  Echocardiogram June 2018: - Left ventricle: Inferior and posterior lateral hypokinesis Wall   thickness was increased in a pattern of severe LVH. Systolic   function was moderately to severely reduced. The estimated   ejection fraction was in the range of 30% to 35%. The study is   not technically sufficient to allow evaluation of LV diastolic   function. - Mitral valve: There was mild to moderate regurgitation. - Left atrium: The atrium was severely dilated. - Right ventricle: The cavity size was moderately dilated. - Right atrium: The atrium was moderately dilated. - Atrial septum: No defect or patent foramen ovale was identified. - Impressions: RV is dialted with septal flattening consisant wtih   cor pulmonale.  Impressions:  - RV is dialted with septal flattening consisant wtih cor    pulmonale.   Assessment/Plan Principal Problem:   Acute kidney injury superimposed on chronic kidney disease (HCC) Active Problems:   Acute on chronic combined systolic and diastolic CHF (congestive heart failure) (HCC)   AF (paroxysmal atrial fibrillation) (HCC)   Hyponatremia   C. difficile colitis   Type 2 diabetes mellitus with left diabetic foot ulcer (HCC)   CKD (chronic kidney disease), stage III (HCC)   Pressure injury of skin  CAD (coronary artery disease)   Warfarin anticoagulation   Anemia due to chronic kidney disease  1.  Acute kidney injury superimposed on chronic kidney disease stage III: Patient has been transferred for evaluation with.  I am concerned because he has had very poor urine output despite being given Bumex and Zaroxolyn at the outside facility.  He now has anasarca and chest x-ray obtained this morning shows worsening pulmonary edema.  There he may be a candidate for hemodialysis.  Patient did require this treatment in January 2018 when he was admitted and required a medically induced coma and hemodialysis.  I have consulted Dr. Moshe Cipro from nephrology.  2.  Acute on chronic combined systolic and diastolic congestive heart failure: Echocardiogram obtained in the past 6 months showed a dilated right ventricle as well as an ejection fraction of 30%.  Patient exhibited hypokinesia and severe LVH with an EF of 30-35%.  Diastolic function was unable to be assessed.  We will continue home medicines as able.  Depending on his course patient may require consultation by his primary cardiologist Dr. Karena Addison.  3.  Paroxysmal atrial fibrillation: On presentation to outside facility patient was in sinus rhythm.  He has now however been under significant stress and his heart rhythm has converted to atrial fibrillation.  He is anticoagulated at home on warfarin however he has been rather unstable with his warfarin levels.  INR was markedly elevated on presentation to  outside hospital.  At this point I am going to hold warfarin and start heparin anticoagulation and full dose strength.  Regarding hemodialysis further recommendations regarding anticoagulation for pending nephrology evaluation.  4.  Hyponatremia: Likely related to profound volume overload.  Patient's sodium was 122 on repeat after admission to the outside facility.  Today's BMP is pending.  He is on a fluid restriction I will also place a free water restriction.  5.  C. difficile colitis: Per our pharmacy recommendations I will start the patient on 500 mg of vancomycin orally every 6 hours.  Please note patient's sepsis felt due to the C. difficile colitis and therefore this is a severe case.  This would warrant this increased dose.  6.  Type 2 diabetes mellitus with left diabetic foot ulcer: We will hold hypoglycemic medications at this point as patient is unstable with regards to his renal function.  Will check fingerstick blood glucoses before meals and at bedtime.  7.  Pressure injury of skin: Patient with right lower extremity venous stasis ulcer, left foot diabetic ulcer, and a stage II sacral injury with deep tissue damage.  We will consult wound care for assistance.  8.  Coronary artery disease: Continue management.  No evidence of acute coronary insufficiency.  9.  Warfarin anticoagulation: Currently on hold due to concerns regarding patient's stability.  Will likely need to be restarted prior to discharge.  10.  Anemia due to chronic kidney disease: Noted will defer to nephrologist regarding efficacy of erythropoietin at this point.  In the past patient has been a DO NOT RESUSCITATE.  We will enter a full code order pending confirmation from patient's family.   DVT prophylaxis: Fully anticoagulated on heparin Code Status: Full code pending confirmation of DNR status per family. Family Communication: *Family not available at the time of admission. Disposition Plan: Given medically  complex and weak patient will likely require skilled nursing facility at discharge. Consults called: Dr. Moshe Cipro from nephrology Admission status: Inpatient  Patient is acutely ill and very medically complicated he  has required greater than 70 minutes of my time for admission and evaluation.  He will require a minimum of 2 midnights inpatient stay.  Lady Deutscher MD FACP [facility] Pager [phone] If 7PM-7AM, please contact night-coverage [email or web address] Password [email or web address]  09/16/2017, 10:12 AM

## 2017-09-16 NOTE — Progress Notes (Signed)
[place] for heparin Indication: atrial fibrillation  Heparin Dosing Weight: 101.6 kg  Assessment: 71 yom on warfarin PTA transferred from [facility] due to AKI requiring Nephrology eval. Pharmacy consulted to dose heparin for afib. Per notes, on initial presentation to [facility], INR elevated at 5.8, peaked at 8.4 (reversed), the day prior to transfer was back down to 1.88 this morning.   Heparin level low at 0.13, no issues per nursing. Will increase dose.   Goal of Therapy:  Heparin level 0.3-0.7 units/ml Monitor platelets by anticoagulation protocol: Yes   Plan:  Increase heparin to 1850/hr Recheck level with am labs  Erin Hearing PharmD., BCPS Clinical Pharmacist Pager [phone] 09/16/2017 9:04 PM

## 2017-09-16 NOTE — Consult Note (Signed)
[facility] Nurse wound consult note Reason for Consult: sacrum and LEs  Patient with history of DM, CAD. He is limited on what he will tell me today due to level of consciousness but he does answer questions if stimulated  Wound type: 1. Surgical site; left lateral 5th toe amputation  2. Venous stasis: RLE x 2 ruptured bulla, most likely related to edema 3. Stage 2 Pressure injury upper gluteal cleft with moisture component, patient incontinent of stool  Pressure Injury POA: Yes Measurement: Gluteal cleft: 1.5cm x 0.5cm x 0.2cm  Left lateral amputation site: 4cm x 1cm x 0.1cm  Right medial lower leg: 3cm x 2cm x 0.1cm with 1/2 blister roof intact Right posterior lower leg: 3cm x 1.5cm x 0.1cm  Wound bed:all sites are clean, pink, moist Drainage (amount, consistency, odor) minimal, no odor Periwound: intact, palpable pulses distally, warm LE Dressing procedure/placement/frequency: Moisture barrier cream only for the gluteal cleft wound, with his frequent incontinence of stool the sacral dressing is pulling stool into the gluteal cleft, creating even more moisture.   Silicone foam to the right LE wounds and the left lateral foot wound. Change every 3 days and PRN soilage.   Discussed POC with patient and bedside nurse.  Re consult if needed, will not follow at this time. Thanks  Paiten Boies [employer], RN,CWOCN, CNS, [place] [phone])

## 2017-09-16 NOTE — Progress Notes (Addendum)
[place] for heparin Indication: atrial fibrillation  Heparin Dosing Weight: 101.6 kg   Assessment: 71 yom on warfarin PTA transferred from [facility] due to AKI requiring Nephrology eval. Pharmacy consulted to dose heparin for afib. Per notes, on initial presentation to [name], INR elevated at 5.8, peaked at 8.4 (reversed), the day prior to transfer was back down to 2.5. INR pending on admission to [facility] - will start heparin when INR<2.  Goal of Therapy:  Heparin level 0.3-0.7 units/ml Monitor platelets by anticoagulation protocol: Yes   Plan:  F/u INR on admission to [facility] Heparin when INR<2 Monitor CBC daily, s/sx bleeding Warfarin on hold  [name], PharmD, BCPS Clinical Pharmacist 09/16/2017 9:35 AM   ADDENDUM:  INR resulted subtherapeutic at 1.88 - ok to start heparin now. Hg 10.2, plt wnl. No bleed documented.  Plan: Start heparin at 1550 units/hr (no bolus) 8h heparin level Daily heparin level/CBC Monitor for s/sx bleeding Warfarin on hold  [name], PharmD, BCPS Clinical Pharmacist Clinical phone for 09/16/2017 until 3:30pm: [phone] If after 3:30pm, please call main pharmacy at: [phone] 09/16/2017 11:12 AM

## 2017-09-16 NOTE — Consult Note (Addendum)
Reason for Consult: Acute kidney injury on chronic kidney disease stage III Referring Physician: Clementeen Graham M.D. [facility])   HPI:  (History obtained from chart as the patient has altered mentation and unable to offer further information)  71 year old Caucasian man with past medical history significant for systolic congestive heart failure (EF 30-35%), atrial fibrillation on chronic anticoagulation with Coumadin, history of coronary artery disease status post PCI, diet-controlled type 2 diabetes mellitus and chronic kidney disease stage III at baseline (creatinine 1.5-1.6). He was seen earlier this year in January for acute kidney injury on chronic kidney disease stage III associated with congestive heart failure exacerbation/Bactrim use and transiently required CRRT following which recovered renal function and established the above baseline.  He was admitted to [facility] 3 days ago with Clostridium difficile colitis associated with sepsis and found to be in acute renal failure with a creatinine of 3.3 refractory to efforts at volume resuscitation. He was transferred to this hospital overnight with worsening renal failure-creatinine now up to 4.5, BUN 90 with hyponatremia of 126. He has not received any iodinated intravenous contrast but remains relatively hypotensive. Chest x-ray earlier today shows cardiomegaly with bilateral pulmonary opacities-likely pulmonary edema with associated pleural effusions.  Past Medical History:  Diagnosis Date  . CAD (coronary artery disease)   . Cellulitis and abscess of lower extremity   . CHF (congestive heart failure) (HCC)    EF 30%  . Chronic kidney disease, stage III (moderate) (HCC)   . DM (diabetes mellitus) ([place])     Past Surgical History:  Procedure Laterality Date  . CORONARY ANGIOPLASTY WITH STENT PLACEMENT    . HAND SURGERY     At [facility], around 2014    Family History  Problem Relation Age of Onset  . Hypertension Mother   .  Diabetes Mother   . Bone cancer Mother   . Hypertension Father   . Bladder Cancer Sister     Social History:  reports that he has quit smoking. he has never used smokeless tobacco. He reports that he does not drink alcohol or use drugs.  Allergies: No Known Allergies  Medications:  Scheduled: . amiodarone  200 mg Oral BID  . atorvastatin  40 mg Oral QHS  . bumetanide  4 mg Oral BID  . feeding supplement (PRO-STAT SUGAR FREE 64)  30 mL Oral TID BM  . [START ON 09/17/2017] ferrous sulfate  325 mg Oral Q breakfast  . insulin aspart  0-9 Units Subcutaneous TID WC  . metolazone  5 mg Oral Daily  . pantoprazole  20 mg Oral Daily  . sodium chloride flush  3 mL Intravenous Q12H  . sodium chloride flush  3 mL Intravenous Q12H  . traZODone  50 mg Oral QHS  . vancomycin  500 mg Oral Q6H  . vitamin B-12  1,000 mcg Oral Daily    BMP Latest Ref Rng & Units 09/16/2017 06/12/2017 06/11/2017  Glucose 65 - 99 mg/dL 165(H) 141(H) 146(H)  BUN 6 - 20 mg/dL 90(H) 41(H) 48(H)  Creatinine 0.61 - 1.24 mg/dL 4.46(H) 1.47(H) 1.69(H)  Sodium 135 - 145 mmol/L 126(L) 135 135  Potassium 3.5 - 5.1 mmol/L 4.2 4.3 3.9  Chloride 101 - 111 mmol/L 89(L) 90(L) 92(L)  CO2 22 - 32 mmol/L 24 32 34(H)  Calcium 8.9 - 10.3 mg/dL 7.3(L) 8.6(L) 8.4(L)   CBC Latest Ref Rng & Units 09/16/2017 06/12/2017 06/11/2017  WBC 4.0 - 10.5 K/uL 29.6(H) 14.7(H) 14.6(H)  Hemoglobin 13.0 - 17.0  g/dL 10.2(L) 10.8(L) 10.0(L)  Hematocrit 39.0 - 52.0 % 30.3(L) 35.7(L) 32.8(L)  Platelets 150 - 400 K/uL 293 385 337   Portable Chest 1 View  Result Date: 09/16/2017 CLINICAL DATA:  Dyspnea.  Altered mental status. EXAM: PORTABLE CHEST 1 VIEW COMPARISON:  09/13/2017. FINDINGS: The heart is enlarged. There is worsening aeration, with increasing BILATERAL pulmonary opacities, BILATERAL pleural effusions, RIGHT greater than LEFT, likely to represent pulmonary edema. BILATERAL pneumonia is less favored. No osseous findings. IMPRESSION: Marked worsening  aeration. Development of Cardiomegaly with BILATERAL pulmonary opacities, RIGHT greater than LEFT, likely pulmonary edema. Electronically Signed   By: Staci Righter M.D.   On: 09/16/2017 09:53    Review of Systems  Unable to perform ROS: Mental status change   Blood pressure (!) 98/47, pulse 67, temperature 97.7 F (36.5 C), temperature source Oral, resp. rate 18, height 6' (1.829 m), weight 112.2 kg (247 lb 5.7 oz), SpO2 96 %. Physical Exam  Nursing note and vitals reviewed. Constitutional: He appears well-developed and well-nourished.  Does not appear to be distressed but appears to be uncomfortable-intermittently yelling out and having hiccups  HENT:  Head: Normocephalic and atraumatic.  Dry oral mucosa  Eyes: EOM are normal. Pupils are equal, round, and reactive to light. No scleral icterus.  Neck: Normal range of motion. No tracheal deviation present.  Cardiovascular: Normal rate. Exam reveals no friction rub.  No murmur heard. Irregularly irregular  Respiratory: Effort normal. He has no wheezes.  Diminished breath sounds over bases  GI: Soft. He exhibits distension. There is no tenderness. There is no guarding.  Gaseous distention-tympanitic on percussion  Musculoskeletal: He exhibits edema.  Trace to 1+ lower extremity  Neurological:  Oriented only to self-responds to calling out his name otherwise does not respond to questions  Skin: Skin is warm and dry. No rash noted. No erythema.    Assessment/Plan: 1. Acute kidney injury on chronic kidney disease stage III: Oliguric overnight-urine output charted at 500 mL prior to transfer and so far has put out at 175 mL. Appears to be consistent with sepsis associated ATN that is further complicated by his history of congestive heart failure with no radiological evidence of volume overload and possibly acute cardiorenal syndrome. Unfortunately, difficult situation with regards to management as hypotension will clearly make diuretic  therapy challenging. He does have some edema that reflects interstitial volume excess but he likely still has renal hypoperfusion based on his hypertension. Will check urine electrolytes and attempt intravenous diuretics instead of oral diuretics. Switch to furosemide 80 mg IV with intravenous albumin-he has significant hypoalbuminemia with a level of 1.4. If fails diuretic therapy and hemodialysis required, would make a case for short-term hemodialysis (IHD versus CVVH depending on blood pressures) only after extensive discussions with next of kin. Would not offer long-term hemodialysis given his prior history of debilitation and limited functional status as this would likely significantly worsen his quality of life and hasten mortality. Avoid nephrotoxic medications including NSAIDs and contrast exposure. Continue strict Input and Output monitoring. Will monitor the patient closely with you and intervene or adjust therapy as indicated by changes in clinical status/labs. 2. Hyponatremia: Secondary to acute kidney injury/congestive heart failure and free water excretion defect-attempt at this time to optimize diuretic therapy. Discontinue metolazone at this time. 3. Anemia: Likely anemia of chronic disease versus acute illness-check iron studies and monitor his hemoglobin/hematocrit trend. No indications for PRBC or ESA at this time. 4. Clostridium difficile colitis/sepsis: Remains relatively hypotensive and currently  on oral vancomycin. Follow stool frequency/symptom improvement. 5. Paroxysmal atrial fibrillation 6. History of congestive heart failure: Currently with clinical/radiological evidence of decompensation possibly in the face of acute kidney injury/intravenous fluid therapy that was necessitated by his presentation with sepsis/C. difficile colitis. Attempt adjustment of diuretic therapy. 7. Hypoalbuminemia/protein calorie malnutrition/debilitation-deconditioning  Chayce Robbins K. 09/16/2017, 1:06 PM

## 2017-09-16 NOTE — Care Management Note (Signed)
Case Management Note  Patient Details  Name: Christian Garner MRN: [id number] Date of Birth: Jun 21, 1946  Subjective/Objective:    Pt admitted with C Diff associated sepsis- also found to be in AKI - may need intermittent HD or CRRT                Action/Plan:  PTA independent from home.  Pt will need PT/OT eval once medically stable   Expected Discharge Date:                  Expected Discharge Plan:     In-House Referral:     Discharge planning Services  CM Consult  Post Acute Care Choice:    Choice offered to:     DME Arranged:    DME Agency:     HH Arranged:    HH Agency:     Status of Service:     If discussed at [employer], dates discussed:    Additional Comments:  [name], RN 09/16/2017, 2:26 PM

## 2017-09-17 ENCOUNTER — Ambulatory Visit: Payer: Medicare Other | Admitting: Sports Medicine

## 2017-09-17 LAB — CBC
HCT: 28.9 % — ABNORMAL LOW (ref 39.0–52.0)
Hemoglobin: 9.4 g/dL — ABNORMAL LOW (ref 13.0–17.0)
MCH: 29.2 pg (ref 26.0–34.0)
MCHC: 32.5 g/dL (ref 30.0–36.0)
MCV: 89.8 fL (ref 78.0–100.0)
PLATELETS: 256 10*3/uL (ref 150–400)
RBC: 3.22 MIL/uL — ABNORMAL LOW (ref 4.22–5.81)
RDW: 17.6 % — AB (ref 11.5–15.5)
WBC: 22.3 10*3/uL — ABNORMAL HIGH (ref 4.0–10.5)

## 2017-09-17 LAB — PROTIME-INR
INR: 2.62
PROTHROMBIN TIME: 27.8 s — AB (ref 11.4–15.2)

## 2017-09-17 LAB — COMPREHENSIVE METABOLIC PANEL
ALBUMIN: 1.9 g/dL — AB (ref 3.5–5.0)
ALK PHOS: 138 U/L — AB (ref 38–126)
ALT: 12 U/L — AB (ref 17–63)
AST: 18 U/L (ref 15–41)
Anion gap: 15 (ref 5–15)
BILIRUBIN TOTAL: 1.1 mg/dL (ref 0.3–1.2)
BUN: 93 mg/dL — AB (ref 6–20)
CALCIUM: 7.6 mg/dL — AB (ref 8.9–10.3)
CO2: 24 mmol/L (ref 22–32)
CREATININE: 4.67 mg/dL — AB (ref 0.61–1.24)
Chloride: 88 mmol/L — ABNORMAL LOW (ref 101–111)
GFR calc Af Amer: 13 mL/min — ABNORMAL LOW (ref >60)
GFR, EST NON AFRICAN AMERICAN: 11 mL/min — AB (ref >60)
GLUCOSE: 107 mg/dL — AB (ref 65–99)
Potassium: 4.2 mmol/L (ref 3.5–5.1)
Sodium: 127 mmol/L — ABNORMAL LOW (ref 135–145)
TOTAL PROTEIN: 4.9 g/dL — AB (ref 6.5–8.1)

## 2017-09-17 LAB — GLUCOSE, CAPILLARY
GLUCOSE-CAPILLARY: 110 mg/dL — AB (ref 65–99)
Glucose-Capillary: 106 mg/dL — ABNORMAL HIGH (ref 65–99)
Glucose-Capillary: 125 mg/dL — ABNORMAL HIGH (ref 65–99)
Glucose-Capillary: 105 mg/dL — ABNORMAL HIGH (ref 65–99)

## 2017-09-17 LAB — HEPARIN LEVEL (UNFRACTIONATED): Heparin Unfractionated: 0.28 IU/mL — ABNORMAL LOW (ref 0.30–0.70)

## 2017-09-17 MED ORDER — MUPIROCIN 2 % EX OINT
1.0000 "application " | TOPICAL_OINTMENT | Freq: Two times a day (BID) | CUTANEOUS | Status: AC
  Administered 2017-09-17 – 2017-09-21 (×10): 1 via NASAL
  Filled 2017-09-17 (×4): qty 22

## 2017-09-17 MED ORDER — FUROSEMIDE 10 MG/ML IJ SOLN
160.0000 mg | Freq: Three times a day (TID) | INTRAVENOUS | Status: DC
  Administered 2017-09-17 – 2017-09-19 (×6): 160 mg via INTRAVENOUS
  Filled 2017-09-17 (×2): qty 16
  Filled 2017-09-17: qty 4
  Filled 2017-09-17 (×2): qty 16
  Filled 2017-09-17: qty 10
  Filled 2017-09-17 (×4): qty 16

## 2017-09-17 MED ORDER — CHLORHEXIDINE GLUCONATE CLOTH 2 % EX PADS
6.0000 | MEDICATED_PAD | Freq: Every day | CUTANEOUS | Status: AC
  Administered 2017-09-17 – 2017-09-21 (×5): 6 via TOPICAL

## 2017-09-17 MED ORDER — HALOPERIDOL LACTATE 5 MG/ML IJ SOLN
2.0000 mg | Freq: Once | INTRAMUSCULAR | Status: AC
  Administered 2017-09-17: 2 mg via INTRAVENOUS
  Filled 2017-09-17: qty 1

## 2017-09-17 NOTE — Progress Notes (Signed)
[place] for heparin Indication: atrial fibrillation  Heparin Dosing Weight: 101.6 kg  Assessment: 71 yom on warfarin PTA transferred from [facility] due to AKI requiring Nephrology eval. Pharmacy consulted to dose heparin for afib. Per notes, on initial presentation to [name], INR elevated at 5.8, peaked at 8.4 (reversed with 2.5 mg PO on 12/4), and on 12/5 confirmed that patient received 4.5 mg of warfarin. Upon transfer the INR was 1.88 likely due to reversal.   Heparin level this morning came back slightly below goal range at 0.28, on 1850 units/hr. Hgb down slightly from 10.2 to 9.4. Platelets remain stable. Scr stable at 4.67, nephrology consulted. No issues with heparin infusion per nursing.    INR is 2.62 today. Given recent administration of warfarin on 12/5 after reversal, likely true INR. No signs/symptoms of bleeding noted. Discussed with Dr Thereasa Solo.   Goal of Therapy:  Heparin level 0.3-0.7 units/ml Monitor platelets by anticoagulation protocol: Yes   Plan:  Stop heparin infusion Monitor daily INR and CBC Restart heparin infusion once INR<2 Follow up on plan when to restart warfarin  Doylene Canard, PharmD Clinical Pharmacist  Pager: [phone] Clinical Phone for 09/17/2017 until 3:30pm: [phone] If after 3:30pm, please call main pharmacy at [phone] 09/17/2017 8:06 AM

## 2017-09-17 NOTE — Care Management Note (Addendum)
Case Management Note  Patient Details  Name: Christian Garner MRN: [id number] Date of Birth: May 12, 1946  Subjective/Objective:     Pt admitted with sepsis from C diff               Action/Plan:   PTA from home with wife.  Pt has required flexiseal and is confused, requiring both IV lasix and albumin.  Pt will need PT eval once appropriate - CSW informed of potential SNF recommendation.  CM will continue to follow for discharge needs   Expected Discharge Date:                  Expected Discharge Plan:  Home/Self Care  In-House Referral:     Discharge planning Services  CM Consult  Post Acute Care Choice:    Choice offered to:     DME Arranged:    DME Agency:     HH Arranged:    HH Agency:     Status of Service:     If discussed at [employer] Meet[employer], dates discussed:    Additional Comments:  [name], RN 09/17/2017, 2:22 PM

## 2017-09-17 NOTE — Progress Notes (Signed)
[place] TEAM 1 - Stepdown/ICU TEAM  YADEN SEITH  IPJ:[id number] DOB: 02-Feb-1946 DOA: 09/16/2017 PCP: [name], MD    Brief Narrative:  71 y.o. male with history of systolic CHF with an EF of 30% on home oxygen, CAD s/p PTCA greater than 1 year ago, atrial fibrillation chronically anticoagulated with warfarin, CKD with a baseline creatinine of 1.3 (hemodialysis during admission in January 2018), and DM2 with chronic diabetic foot ulcer who presented in transfer from [facility] for evaluation of acute on chronic renal failure.  Patient presented to [facility] on December 3 acutely ill with evidence of sepsis.  He was found to have C. difficile colitis.  He was in acute renal failure upon presentation and his renal function failed to improve.  Consultation was sought with Dr. Justin Mend per telephone and recommendation was made for transfer to our facility.  On initial presentation his INR was markedly elevated at 5.8 and increased to 8.4 on the day after admission.  At that point he received treatment w/ vitamin K.  On the day prior to transfer INR was down to 2.5.     Significant Events: 12/3 admit to [facility] c/ sepsis due to C. Diff 12/6 transfer to [facility]  12/7 flexiseal inserted (29 day limit)  Subjective: Awake but moaning unintelligibly most of my visit.  Will answer some short directed questions but can not provide a detailed ROS.  Does not appear uncomfortable, and is not in acute resp distress.    Assessment & Plan:  Acute kidney injury superimposed on chronic kidney disease stage III Nephrology is attending to this issue - felt to be ATN due to sepsis Recent Labs  Lab 09/16/17 1025 09/17/17 0310  CREATININE 4.46* 4.67*      Acute on chronic combined systolic and diastolic congestive heart failure EF of 30-35% - follow Is/Os and daily weights - attempts to diurese ongoing   Chronic Paroxysmal atrial fibrillation w/ acute RVR on warfarin  chronically Warfarin per Pharmacy - HR controlled   Hyponatremia Due to acute kidney injury, CHF, and free water excretion defect - attempting to diurese   C. difficile colitis Cont vancomycin - place flexiseal to minimize skin damage in perineum   DM2 with left diabetic foot ulcer CBG currently well controlled   Multiple cutaneous injuries  right lower extremity venous stasis ulcer, left foot diabetic ulcer, and a stage II sacral injury with deep tissue damage - WOC has been consulted   Coronary artery disease No evidence of acute coronary syndrome   Anemia of chronic kidney disease + acute illness Follow Hgb trend   MRSA screen +  DVT prophylaxis: warfarin  Code Status: FULL CODE Family Communication: no family present at time of exam  Disposition Plan: SDU  Consultants:  Nephrology   Antimicrobials:  Vancomycin 12/6 >  Objective: Blood pressure (!) 112/48, pulse 74, temperature 98.1 F (36.7 C), temperature source Other (Comment), resp. rate 16, height 6' (1.829 m), weight 111.9 kg (246 lb 11.1 oz), SpO2 97 %.  Intake/Output Summary (Last 24 hours) at 09/17/2017 1136 Last data filed at 09/17/2017 0831 Gross per 24 hour  Intake 402.66 ml  Output 525 ml  Net -122.34 ml   Filed Weights   09/16/17 0900 09/17/17 0424  Weight: 112.2 kg (247 lb 5.7 oz) 111.9 kg (246 lb 11.1 oz)    Examination: General: No acute respiratory distress Lungs: Clear to auscultation bilaterally without wheezes or crackles Cardiovascular: Regular rate without murmur gallop  or rub normal S1 and S2 Abdomen: modestly distended, soft, bowel sounds positive, no rebound, no ascites, no appreciable mass Extremities: 1+ B LE edema   CBC: Recent Labs  Lab 09/16/17 1025 09/17/17 0310  WBC 29.6* 22.3*  NEUTROABS 27.5*  --   HGB 10.2* 9.4*  HCT 30.3* 28.9*  MCV 89.6 89.8  PLT 293 259   Basic Metabolic Panel: Recent Labs  Lab 09/16/17 1025 09/17/17 0310  NA 126* 127*  K 4.2 4.2    CL 89* 88*  CO2 24 24  GLUCOSE 165* 107*  BUN 90* 93*  CREATININE 4.46* 4.67*  CALCIUM 7.3* 7.6*  MG 2.1  --   PHOS 6.3*  --    GFR: Estimated Creatinine Clearance: 18.5 mL/min (A) (by C-G formula based on SCr of 4.67 mg/dL (H)).  Liver Function Tests: Recent Labs  Lab 09/16/17 1025 09/17/17 0310  AST 19 18  ALT 12* 12*  ALKPHOS 144* 138*  BILITOT 0.9 1.1  PROT 4.5* 4.9*  ALBUMIN 1.4* 1.9*    Coagulation Profile: Recent Labs  Lab 09/16/17 1025 09/17/17 0310  INR 1.88 2.62    HbA1C: Hgb A1c MFr Bld  Date/Time Value Ref Range Status  09/16/2017 10:25 AM 6.1 (H) 4.8 - 5.6 % Final    Comment:    (NOTE) Pre diabetes:          5.7%-6.4% Diabetes:              >6.4% Glycemic control for   <7.0% adults with diabetes   03/24/2017 04:04 AM 6.7 (H) 4.8 - 5.6 % Final    Comment:    (NOTE)         Pre-diabetes: 5.7 - 6.4         Diabetes: >6.4         Glycemic control for adults with diabetes: <7.0     CBG: Recent Labs  Lab 09/16/17 0921 09/16/17 1225 09/16/17 1628 09/16/17 2139 09/17/17 0824  GLUCAP 157* 144* 134* 118* 106*    Recent Results (from the past 240 hour(s))  MRSA PCR Screening     Status: Abnormal   Collection Time: 09/16/17 10:38 AM  Result Value Ref Range Status   MRSA by PCR POSITIVE (A) NEGATIVE Final    Comment:        The GeneXpert MRSA Assay (FDA approved for NASAL specimens only), is one component of a comprehensive MRSA colonization surveillance program. It is not intended to diagnose MRSA infection nor to guide or monitor treatment for MRSA infections. RESULT CALLED TO, READ BACK BY AND VERIFIED WITH: H. Deaton RN 12:20 09/16/17 ([email or web address])   C difficile quick scan w PCR reflex     Status: Abnormal   Collection Time: 09/16/17  1:24 PM  Result Value Ref Range Status   C Diff antigen POSITIVE (A) NEGATIVE Final   C Diff toxin NEGATIVE NEGATIVE Final   C Diff interpretation Results are indeterminate. See PCR results.  Final   Clostridium Difficile by PCR     Status: Abnormal   Collection Time: 09/16/17  1:24 PM  Result Value Ref Range Status   Toxigenic C Difficile by pcr POSITIVE (A) NEGATIVE Final    Comment: Positive for toxigenic C. difficile with little to no toxin production. Only treat if clinical presentation suggests symptomatic illness.     Scheduled Meds: . amiodarone  200 mg Oral BID  . atorvastatin  40 mg Oral QHS  . Chlorhexidine Gluconate Cloth  6 each Topical Q0600  .  feeding supplement (PRO-STAT SUGAR FREE 64)  30 mL Oral TID BM  . ferrous sulfate  325 mg Oral Q breakfast  . insulin aspart  0-9 Units Subcutaneous TID WC  . mouth rinse  15 mL Mouth Rinse BID  . mupirocin ointment  1 application Nasal BID  . pantoprazole  20 mg Oral Daily  . sodium chloride flush  3 mL Intravenous Q12H  . sodium chloride flush  3 mL Intravenous Q12H  . traZODone  50 mg Oral QHS  . vancomycin  500 mg Oral Q6H  . vitamin B-12  1,000 mcg Oral Daily     LOS: 1 day   [name], MD [facility] Office  [phone] Pager - Text Page per Amion as per below:  On-Call/Text Page:      [email or web address]      password [email or web address]  If 7PM-7AM, please contact night-coverage [email or web address] Password [email or web address] 09/17/2017, 11:36 AM

## 2017-09-17 NOTE — Progress Notes (Signed)
Subjective:  Pt yelling out in room- only 225 of UOP recorded - BP a little higher- has over 200 in foley right now Objective Vital signs in last 24 hours: Vitals:   09/16/17 1921 09/16/17 2100 09/16/17 2344 09/17/17 0424  BP:  (!) 110/52  (!) 104/44  Pulse:  73  74  Resp:  19  16  Temp: 98 F (36.7 C)  98.1 F (36.7 C) 98.1 F (36.7 C)  TempSrc: Oral  Oral Other (Comment)  SpO2:  98%  97%  Weight:    111.9 kg (246 lb 11.1 oz)  Height:       Weight change:   Intake/Output Summary (Last 24 hours) at 09/17/2017 0815 Last data filed at 09/17/2017 0300 Gross per 24 hour  Intake 402.66 ml  Output 225 ml  Net 177.66 ml   Assessment/Plan: 1. Acute kidney injury on chronic kidney disease stage III:  Appears consistent with sepsis associated ATN that is further complicated by his history of decreased EF with now evidence of volume overload and possible acute cardiorenal syndrome. Difficult situation with regards to management as hypotension will  make diuresis challenging. He does have edema - but he likely still has renal hypoperfusion based on his hypertension.continue furosemide- increase to 160 mg IV q 8 hours-he has significant hypoalbuminemia with a level of 1.4. If fails diuretic therapy and hemodialysis required, could make a case for short-term hemodialysis (IHD versus CVVH depending on blood pressures) only after extensive discussions with next of kin. Would not offer long-term hemodialysis given his prior history of debilitation and limited functional status as this would likely significantly worsen his quality of life and hasten mortality. Avoid nephrotoxic medications including NSAIDs and contrast exposure. Continue strict Input and Output monitoring. Still no absolute need for HD- I really do not want to venture into the land of dialysis for this guy- some UOP- will hope it continues 2. Hyponatremia: Secondary to acute kidney injury/congestive heart failure and free water excretion  defect-attempt at this time to optimize diuretic therapy.  3. Anemia: Likely anemia of chronic disease versus acute illness-check iron studies and monitor his hemoglobin/hematocrit trend. No indications for PRBC or ESA at this time. No iron due to sepsis 4. Clostridium difficile colitis/sepsis: Remains relatively hypotensive and currently on oral vancomycin. Follow stool frequency/symptom improvement. 5. Paroxysmal atrial fibrillation 6. Hypoalbuminemia/protein calorie malnutrition/debilitation-deconditioning    Guiselle Mian A    Labs: Basic Metabolic Panel: Recent Labs  Lab 09/16/17 1025 09/17/17 0310  NA 126* 127*  K 4.2 4.2  CL 89* 88*  CO2 24 24  GLUCOSE 165* 107*  BUN 90* 93*  CREATININE 4.46* 4.67*  CALCIUM 7.3* 7.6*  PHOS 6.3*  --    Liver Function Tests: Recent Labs  Lab 09/16/17 1025 09/17/17 0310  AST 19 18  ALT 12* 12*  ALKPHOS 144* 138*  BILITOT 0.9 1.1  PROT 4.5* 4.9*  ALBUMIN 1.4* 1.9*   No results for input(s): LIPASE, AMYLASE in the last 168 hours. No results for input(s): AMMONIA in the last 168 hours. CBC: Recent Labs  Lab 09/16/17 1025 09/17/17 0310  WBC 29.6* 22.3*  NEUTROABS 27.5*  --   HGB 10.2* 9.4*  HCT 30.3* 28.9*  MCV 89.6 89.8  PLT 293 256   Cardiac Enzymes: No results for input(s): CKTOTAL, CKMB, CKMBINDEX, TROPONINI in the last 168 hours. CBG: Recent Labs  Lab 09/16/17 0921 09/16/17 1225 09/16/17 1628 09/16/17 2139  GLUCAP 157* 144* 134* 118*    Iron Studies:  Recent Labs    09/16/17 1411  IRON 24*  TIBC 122*  FERRITIN 692*   Studies/Results: Portable Chest 1 View  Result Date: 09/16/2017 CLINICAL DATA:  Dyspnea.  Altered mental status. EXAM: PORTABLE CHEST 1 VIEW COMPARISON:  09/13/2017. FINDINGS: The heart is enlarged. There is worsening aeration, with increasing BILATERAL pulmonary opacities, BILATERAL pleural effusions, RIGHT greater than LEFT, likely to represent pulmonary edema. BILATERAL pneumonia  is less favored. No osseous findings. IMPRESSION: Marked worsening aeration. Development of Cardiomegaly with BILATERAL pulmonary opacities, RIGHT greater than LEFT, likely pulmonary edema. Electronically Signed   By: Staci Righter M.D.   On: 09/16/2017 09:53   Medications: Infusions: . sodium chloride    . heparin 1,850 Units/hr (09/17/17 0414)    Scheduled Medications: . amiodarone  200 mg Oral BID  . atorvastatin  40 mg Oral QHS  . feeding supplement (PRO-STAT SUGAR FREE 64)  30 mL Oral TID BM  . ferrous sulfate  325 mg Oral Q breakfast  . insulin aspart  0-9 Units Subcutaneous TID WC  . mouth rinse  15 mL Mouth Rinse BID  . pantoprazole  20 mg Oral Daily  . sodium chloride flush  3 mL Intravenous Q12H  . sodium chloride flush  3 mL Intravenous Q12H  . traZODone  50 mg Oral QHS  . vancomycin  500 mg Oral Q6H  . vitamin B-12  1,000 mcg Oral Daily    have reviewed scheduled and prn medications.  Physical Exam: General: yelling, not oriented Heart: RRR Lungs: mostly clear Abdomen: obese, distended Extremities: pitting edema    09/17/2017,8:15 AM  LOS: 1 day

## 2017-09-18 LAB — RENAL FUNCTION PANEL
ALBUMIN: 2 g/dL — AB (ref 3.5–5.0)
Anion gap: 16 — ABNORMAL HIGH (ref 5–15)
BUN: 98 mg/dL — AB (ref 6–20)
CALCIUM: 7.8 mg/dL — AB (ref 8.9–10.3)
CO2: 24 mmol/L (ref 22–32)
CREATININE: 4.28 mg/dL — AB (ref 0.61–1.24)
Chloride: 88 mmol/L — ABNORMAL LOW (ref 101–111)
GFR calc Af Amer: 15 mL/min — ABNORMAL LOW (ref >60)
GFR calc non Af Amer: 13 mL/min — ABNORMAL LOW (ref >60)
GLUCOSE: 106 mg/dL — AB (ref 65–99)
PHOSPHORUS: 5.9 mg/dL — AB (ref 2.5–4.6)
Potassium: 4.2 mmol/L (ref 3.5–5.1)
SODIUM: 128 mmol/L — AB (ref 135–145)

## 2017-09-18 LAB — CBC
HCT: 31.7 % — ABNORMAL LOW (ref 39.0–52.0)
Hemoglobin: 10.2 g/dL — ABNORMAL LOW (ref 13.0–17.0)
MCH: 29.4 pg (ref 26.0–34.0)
MCHC: 32.2 g/dL (ref 30.0–36.0)
MCV: 91.4 fL (ref 78.0–100.0)
Platelets: 276 10*3/uL (ref 150–400)
RBC: 3.47 MIL/uL — ABNORMAL LOW (ref 4.22–5.81)
RDW: 17.8 % — AB (ref 11.5–15.5)
WBC: 20.9 10*3/uL — ABNORMAL HIGH (ref 4.0–10.5)

## 2017-09-18 LAB — GLUCOSE, CAPILLARY
GLUCOSE-CAPILLARY: 103 mg/dL — AB (ref 65–99)
GLUCOSE-CAPILLARY: 116 mg/dL — AB (ref 65–99)
Glucose-Capillary: 123 mg/dL — ABNORMAL HIGH (ref 65–99)
Glucose-Capillary: 140 mg/dL — ABNORMAL HIGH (ref 65–99)

## 2017-09-18 LAB — HEPATIC FUNCTION PANEL
ALBUMIN: 2 g/dL — AB (ref 3.5–5.0)
ALK PHOS: 160 U/L — AB (ref 38–126)
ALT: 12 U/L — ABNORMAL LOW (ref 17–63)
AST: 23 U/L (ref 15–41)
BILIRUBIN INDIRECT: 0.6 mg/dL (ref 0.3–0.9)
BILIRUBIN TOTAL: 1 mg/dL (ref 0.3–1.2)
Bilirubin, Direct: 0.4 mg/dL (ref 0.1–0.5)
TOTAL PROTEIN: 5.4 g/dL — AB (ref 6.5–8.1)

## 2017-09-18 LAB — PROTIME-INR
INR: 4.34
Prothrombin Time: 41.2 seconds — ABNORMAL HIGH (ref 11.4–15.2)

## 2017-09-18 NOTE — Evaluation (Signed)
Occupational Therapy Evaluation Patient Details Name: Christian Garner MRN: [id number] DOB: Apr 07, 1946 Today's Date: 09/18/2017    History of Present Illness 71 y.o. male with history of systolic CHF with an EF of 30% on home oxygen, CAD s/p PTCA greater than 1 year ago, atrial fibrillation chronically anticoagulated with warfarin, CKD with a baseline creatinine of 1.3 (hemodialysis during admission in January 2018), and DM2 with chronic diabetic foot ulcer who presented in transfer from [facility] for evaluation of acute on chronic renal failure. Pt found to have sepsis due to c diff.   Clinical Impression   Pt reports he required assist with ADL PTA. Currently pt requires mod-max assist for ADL and mod assist +2 for bed mobility and sit to stand at EOB. Recommending SNF for follow up to maximize independence and safety with ADL and functional mobility prior to return home. Pt would benefit from continued skilled OT to address established goals.    Follow Up Recommendations  SNF;Supervision/Assistance - 24 hour    Equipment Recommendations  3 in 1 bedside commode    Recommendations for Other Services       Precautions / Restrictions Precautions Precautions: Fall Restrictions Weight Bearing Restrictions: No      Mobility Bed Mobility Overal bed mobility: Needs Assistance Bed Mobility: Rolling;Supine to Sit;Sit to Supine Rolling: Mod assist   Supine to sit: Mod assist;+2 for physical assistance Sit to supine: Mod assist;+2 for physical assistance   General bed mobility comments: Assist for LEs and trunk. Cues throughout for sequencing and technique. HOB elevated.  Transfers Overall transfer level: Needs assistance Equipment used: Rolling walker (2 wheeled) Transfers: Sit to/from Stand Sit to Stand: Mod assist;+2 physical assistance;From elevated surface         General transfer comment: Pt pulling up on RW with 2 hands and bed elevated. Mod +2 to boost up and for  balance in standing    Balance Overall balance assessment: Needs assistance Sitting-balance support: Feet supported;Bilateral upper extremity supported Sitting balance-Leahy Scale: Fair Sitting balance - Comments: min guard   Standing balance support: Bilateral upper extremity supported Standing balance-Leahy Scale: Poor                             ADL either performed or assessed with clinical judgement   ADL Overall ADL's : Needs assistance/impaired Eating/Feeding: Minimal assistance   Grooming: Moderate assistance;Sitting   Upper Body Bathing: Moderate assistance;Sitting   Lower Body Bathing: Maximal assistance;Sit to/from stand   Upper Body Dressing : Moderate assistance;Sitting   Lower Body Dressing: Maximal assistance;Sit to/from stand                 General ADL Comments: Mod assist +2 for sit to stand at EOB with RW. Pt unable to side step at EOB for repositioning     Vision         Perception     Praxis      Pertinent Vitals/Pain Pain Assessment: 0-10 Pain Score: 10-Worst pain ever Pain Location: rt side Pain Descriptors / Indicators: Throbbing Pain Intervention(s): Limited activity within patient's tolerance;Repositioned;Premedicated before session;Monitored during session     Hand Dominance Left   Extremity/Trunk Assessment Upper Extremity Assessment Upper Extremity Assessment: Generalized weakness   Lower Extremity Assessment Lower Extremity Assessment: Defer to PT evaluation       Communication Communication Communication: HOH   Cognition Arousal/Alertness: Awake/alert Behavior During Therapy: WFL for tasks assessed/performed Overall Cognitive Status: No  family/caregiver present to determine baseline cognitive functioning Area of Impairment: Orientation                 Orientation Level: Disoriented to;Time             General Comments: Follows one step commands consistently but with confusion throughout  session. Reports it is 1970 something and he "missed a few years" when told it was actually 2018.   General Comments       Exercises     Shoulder Instructions      Home Living Family/patient expects to be discharged to:: Private residence Living Arrangements: Spouse/significant other Available Help at Discharge: Family;Available 24 hours/day Type of Home: House Home Access: Stairs to enter [employer] of Steps: 3 Entrance Stairs-Rails: Left Home Layout: One level     Bathroom Shower/Tub: Occupational psychologist: Standard Bathroom Accessibility: Yes   Home Equipment: Environmental consultant - 2 wheels;Wheelchair - manual          Prior Functioning/Environment Level of Independence: Needs assistance  Gait / Transfers Assistance Needed: RW for mobility. Limited community ambulator; takes w/c for MD appts ADL's / Homemaking Assistance Needed: Wife assisting with LB ADL. Pt only sponge bathing            OT Problem List: Decreased strength;Decreased range of motion;Decreased activity tolerance;Impaired balance (sitting and/or standing);Decreased cognition;Decreased safety awareness;Decreased knowledge of use of DME or AE;Cardiopulmonary status limiting activity;Obesity;Pain      OT Treatment/Interventions: Self-care/ADL training;Therapeutic exercise;Energy conservation;DME and/or AE instruction;Therapeutic activities;Cognitive remediation/compensation;Patient/family education;Balance training    OT Goals(Current goals can be found in the care plan section) Acute Rehab OT Goals Patient Stated Goal: decrease pain OT Goal Formulation: With patient Time For Goal Achievement: 10/02/17 Potential to Achieve Goals: Good  OT Frequency: Min 2X/week   Barriers to D/C:            Co-evaluation PT/OT/SLP Co-Evaluation/Treatment: Yes Reason for Co-Treatment: Complexity of the patient's impairments (multi-system involvement);Necessary to address cognition/behavior during  functional activity;For patient/therapist safety   OT goals addressed during session: ADL's and self-care      AM-PAC PT "6 Clicks" Daily Activity     Outcome Measure Help from another person eating meals?: A Little Help from another person taking care of personal grooming?: A Lot Help from another person toileting, which includes using toliet, bedpan, or urinal?: Total Help from another person bathing (including washing, rinsing, drying)?: A Lot Help from another person to put on and taking off regular upper body clothing?: A Lot Help from another person to put on and taking off regular lower body clothing?: A Lot 6 Click Score: 12   End of Session Equipment Utilized During Treatment: Gait belt;Rolling walker;Oxygen Nurse Communication: Mobility status  Activity Tolerance: Patient tolerated treatment well Patient left: in bed;with call bell/phone within reach;with bed alarm set  OT Visit Diagnosis: Unsteadiness on feet (R26.81);Other abnormalities of gait and mobility (R26.89);Pain Pain - part of body: (buttocks)                Time: 4193-7902 OT Time Calculation (min): 31 min Charges:  OT General Charges $OT Visit: 1 Visit OT Evaluation $OT Eval Moderate Complexity: 1 Mod G-Codes:     Farryn Linares A. Ulice Brilliant, M.S., OTR/L Pager: [place] 09/18/2017, 9:37 AM

## 2017-09-18 NOTE — Evaluation (Signed)
Physical Therapy Evaluation Patient Details Name: Christian Garner MRN: [id number] DOB: 1946/04/12 Today's Date: 09/18/2017   History of Present Illness  71 y.o. male with history of systolic CHF with an EF of 30% on home oxygen, CAD s/p PTCA greater than 1 year ago, atrial fibrillation chronically anticoagulated with warfarin, CKD with a baseline creatinine of 1.3 (hemodialysis during admission in January 2018), and DM2 with chronic diabetic foot ulcer who presented in transfer from [name] for evaluation of acute on chronic renal failure. Pt found to have sepsis due to c diff.  Clinical Impression  Pt admitted with above diagnosis and presents to PT with functional limitations due to deficits listed below (See PT problem list). Pt needs skilled PT to maximize independence and safety to allow discharge to ST-SNF. Feel pt needs more physical assist than family can provide and could benefit from SNF.      Follow Up Recommendations SNF    Equipment Recommendations  None recommended by PT    Recommendations for Other Services       Precautions / Restrictions Precautions Precautions: Fall Restrictions Weight Bearing Restrictions: No      Mobility  Bed Mobility Overal bed mobility: Needs Assistance Bed Mobility: Rolling;Supine to Sit;Sit to Supine Rolling: Mod assist   Supine to sit: Mod assist;+2 for physical assistance;HOB elevated Sit to supine: Mod assist;+2 for physical assistance   General bed mobility comments: Assist for LEs and trunk. Cues throughout for sequencing and technique. HOB elevated.  Transfers Overall transfer level: Needs assistance Equipment used: Rolling walker (2 wheeled) Transfers: Sit to/from Stand Sit to Stand: Mod assist;+2 physical assistance;From elevated surface         General transfer comment: Assist to bring hips up and for balance. Had pt place hands on walker to come up to encourage anterior weight shift  Ambulation/Gait              General Gait Details: Unable  Stairs            Wheelchair Mobility    Modified Rankin (Stroke Patients Only)       Balance Overall balance assessment: Needs assistance Sitting-balance support: Feet supported Sitting balance-Leahy Scale: Fair Sitting balance - Comments: min guard   Standing balance support: Bilateral upper extremity supported Standing balance-Leahy Scale: Poor Standing balance comment: walker And +2 min assist for static standing                             Pertinent Vitals/Pain Pain Assessment: 0-10 Pain Score: 10-Worst pain ever Pain Location: rt side Pain Descriptors / Indicators: Throbbing Pain Intervention(s): Limited activity within patient's tolerance;Monitored during session;Premedicated before session;Repositioned    Home Living Family/patient expects to be discharged to:: Private residence Living Arrangements: Spouse/significant other Available Help at Discharge: Family;Available 24 hours/day Type of Home: House Home Access: Stairs to enter Entrance Stairs-Rails: Left Entrance Stairs-Number of Steps: 3 Home Layout: One level Home Equipment: Walker - 2 wheels;Wheelchair - manual      Prior Function Level of Independence: Needs assistance   Gait / Transfers Assistance Needed: RW for mobility. Limited community ambulator; takes w/c for MD appts  ADL's / Homemaking Assistance Needed: Wife assisting with LB ADL. Pt only sponge bathing        Hand Dominance   Dominant Hand: Left    Extremity/Trunk Assessment   Upper Extremity Assessment Upper Extremity Assessment: Defer to OT evaluation    Lower Extremity Assessment  Lower Extremity Assessment: Generalized weakness       Communication   Communication: HOH  Cognition Arousal/Alertness: Awake/alert Behavior During Therapy: WFL for tasks assessed/performed Overall Cognitive Status: No family/caregiver present to determine baseline cognitive  functioning Area of Impairment: Orientation;Problem solving;Safety/judgement;Following commands                 Orientation Level: Disoriented to;Time     Following Commands: Follows one step commands with increased time;Follows one step commands consistently Safety/Judgement: Decreased awareness of deficits     General Comments: Follows one step commands consistently but with confusion throughout session. Reports it is 1970 something and he "missed a few years" when told it was actually 2018.      General Comments      Exercises     Assessment/Plan    PT Assessment Patient needs continued PT services  PT Problem List Decreased strength;Decreased activity tolerance;Decreased balance;Decreased mobility;Decreased cognition;Decreased knowledge of use of DME;Decreased safety awareness;Obesity       PT Treatment Interventions DME instruction;Gait training;Functional mobility training;Therapeutic activities;Therapeutic exercise;Balance training;Cognitive remediation;Patient/family education    PT Goals (Current goals can be found in the Care Plan section)  Acute Rehab PT Goals Patient Stated Goal: Not stated PT Goal Formulation: With patient Time For Goal Achievement: 10/02/17 Potential to Achieve Goals: Good    Frequency Min 3X/week   Barriers to discharge Inaccessible home environment stairs to enter    Co-evaluation PT/OT/SLP Co-Evaluation/Treatment: Yes Reason for Co-Treatment: Complexity of the patient's impairments (multi-system involvement);Necessary to address cognition/behavior during functional activity;For patient/therapist safety PT goals addressed during session: Mobility/safety with mobility;Balance OT goals addressed during session: ADL's and self-care       AM-PAC PT "6 Clicks" Daily Activity  Outcome Measure Difficulty turning over in bed (including adjusting bedclothes, sheets and blankets)?: Unable Difficulty moving from lying on back to sitting  on the side of the bed? : Unable Difficulty sitting down on and standing up from a chair with arms (e.g., wheelchair, bedside commode, etc,.)?: Unable Help needed moving to and from a bed to chair (including a wheelchair)?: Total Help needed walking in hospital room?: Total Help needed climbing 3-5 steps with a railing? : Total 6 Click Score: 6    End of Session Equipment Utilized During Treatment: Gait belt Activity Tolerance: Patient tolerated treatment well Patient left: in bed;with call bell/phone within reach;with bed alarm set Nurse Communication: Mobility status PT Visit Diagnosis: Unsteadiness on feet (R26.81);Other abnormalities of gait and mobility (R26.89);Muscle weakness (generalized) (M62.81)    Time: 4098-1191 PT Time Calculation (min) (ACUTE ONLY): 28 min   Charges:   PT Evaluation $PT Eval Moderate Complexity: 1 Mod     PT G CodesMarland Kitchen        [facility] [place] 09/18/2017, 12:33 PM

## 2017-09-18 NOTE — Progress Notes (Signed)
Subjective:  Blood pressure a little soft, 2450 of UOP !! Crt down , BUN up slightly  Objective Vital signs in last 24 hours: Vitals:   09/17/17 1800 09/17/17 2000 09/17/17 2330 09/18/17 0342  BP: (!) 109/51 117/63 (!) 127/54 (!) 122/56  Pulse: 71 63 75 75  Resp: 20 15 16 16   Temp: 98.4 F (36.9 C) 97.7 F (36.5 C) 98.4 F (36.9 C) 98.1 F (36.7 C)  TempSrc:  Oral Bladder Bladder  SpO2: 98% 96% 99% 94%  Weight:    109.9 kg (242 lb 4.6 oz)  Height:       Weight change: -2.3 kg (-1.1 oz)  Intake/Output Summary (Last 24 hours) at 09/18/2017 0749 Last data filed at 09/18/2017 0540 Gross per 24 hour  Intake 86 ml  Output 2550 ml  Net -2464 ml   Assessment/Plan: 1. Acute kidney injury on chronic kidney disease stage III:  Consistent with sepsis associated ATN that is further complicated by his history of decreased EF with now evidence of volume overload and possible acute cardiorenal syndrome. Difficult situation with regards to management as hypotension can make diuresis challenging. He does have edema furosemide increased to 160 mg IV q 8 hours and that does seem to be having an effect-he has significant hypoalbuminemia. No indications for HD at present but BUN rising- could make a case for supportive short-term hemodialysis (IHD versus CVVH depending on blood pressures) only after extensive discussions with next of kin. Would not offer long-term hemodialysis given his prior history of debilitation and limited functional status. Avoiding nephrotoxic medications including NSAIDs and contrast exposure. 2. Hyponatremia: Secondary to acute kidney injury/congestive heart failure and free water excretion defect-attempt at this time to optimize diuretic therapy. Improving slowly 3. Anemia: Likely anemia of chronic disease versus acute illness-check iron studies and monitor his hemoglobin/hematocrit trend. No indications for PRBC or ESA at this time. No iron due to sepsis 4. Clostridium difficile  colitis/sepsis: Remains relatively hypotensive and currently on oral vancomycin. Follow stool frequency/symptom improvement. WBC trending down 5. Paroxysmal atrial fibrillation 6. Hypoalbuminemia/protein calorie malnutrition/debilitation-deconditioning    Christian Garner A    Labs: Basic Metabolic Panel: Recent Labs  Lab 09/16/17 1025 09/17/17 0310 09/18/17 0252  NA 126* 127* 128*  K 4.2 4.2 4.2  CL 89* 88* 88*  CO2 24 24 24   GLUCOSE 165* 107* 106*  BUN 90* 93* 98*  CREATININE 4.46* 4.67* 4.28*  CALCIUM 7.3* 7.6* 7.8*  PHOS 6.3*  --  5.9*   Liver Function Tests: Recent Labs  Lab 09/16/17 1025 09/17/17 0310 09/18/17 0252 09/18/17 0316  AST 19 18  --  23  ALT 12* 12*  --  12*  ALKPHOS 144* 138*  --  160*  BILITOT 0.9 1.1  --  1.0  PROT 4.5* 4.9*  --  5.4*  ALBUMIN 1.4* 1.9* 2.0* 2.0*   No results for input(s): LIPASE, AMYLASE in the last 168 hours. No results for input(s): AMMONIA in the last 168 hours. CBC: Recent Labs  Lab 09/16/17 1025 09/17/17 0310 09/18/17 0252  WBC 29.6* 22.3* 20.9*  NEUTROABS 27.5*  --   --   HGB 10.2* 9.4* 10.2*  HCT 30.3* 28.9* 31.7*  MCV 89.6 89.8 91.4  PLT 293 256 276   Cardiac Enzymes: No results for input(s): CKTOTAL, CKMB, CKMBINDEX, TROPONINI in the last 168 hours. CBG: Recent Labs  Lab 09/16/17 2139 09/17/17 0824 09/17/17 1150 09/17/17 1612 09/17/17 2124  GLUCAP 118* 106* 110* 125* 105*    Iron  Studies:  Recent Labs    09/16/17 1411  IRON 24*  TIBC 122*  FERRITIN 692*   Studies/Results: Portable Chest 1 View  Result Date: 09/16/2017 CLINICAL DATA:  Dyspnea.  Altered mental status. EXAM: PORTABLE CHEST 1 VIEW COMPARISON:  09/13/2017. FINDINGS: The heart is enlarged. There is worsening aeration, with increasing BILATERAL pulmonary opacities, BILATERAL pleural effusions, RIGHT greater than LEFT, likely to represent pulmonary edema. BILATERAL pneumonia is less favored. No osseous findings. IMPRESSION: Marked  worsening aeration. Development of Cardiomegaly with BILATERAL pulmonary opacities, RIGHT greater than LEFT, likely pulmonary edema. Electronically Signed   By: Staci Righter M.D.   On: 09/16/2017 09:53   Medications: Infusions: . furosemide Stopped (09/18/17 0146)    Scheduled Medications: . amiodarone  200 mg Oral BID  . atorvastatin  40 mg Oral QHS  . Chlorhexidine Gluconate Cloth  6 each Topical Q0600  . feeding supplement (PRO-STAT SUGAR FREE 64)  30 mL Oral TID BM  . ferrous sulfate  325 mg Oral Q breakfast  . insulin aspart  0-9 Units Subcutaneous TID WC  . mouth rinse  15 mL Mouth Rinse BID  . mupirocin ointment  1 application Nasal BID  . pantoprazole  20 mg Oral Daily  . sodium chloride flush  3 mL Intravenous Q12H  . traZODone  50 mg Oral QHS  . vancomycin  500 mg Oral Q6H  . vitamin B-12  1,000 mcg Oral Daily    have reviewed scheduled and prn medications.  Physical Exam: General: yelling out, able to have short conversations "my rear end is hurting"  "I keep falling out of bed" Heart: RRR Lungs: mostly clear Abdomen: obese, distended Extremities: pitting edema    09/18/2017,7:49 AM  LOS: 2 days

## 2017-09-18 NOTE — Progress Notes (Signed)
[place] TEAM 1 - Stepdown/ICU TEAM  Christian Garner  IPJ:[id number] DOB: 07-03-1946 DOA: 09/16/2017 PCP: [name], MD    Brief Narrative:  71 y.o. male with history of systolic CHF with an EF of 30% on home oxygen, CAD s/p PTCA greater than 1 year ago, atrial fibrillation chronically anticoagulated with warfarin, CKD with a baseline creatinine of 1.3 (hemodialysis during admission in January 2018), and DM2 with chronic diabetic foot ulcer who presented in transfer from [facility] for evaluation of acute on chronic renal failure.  Patient presented to [facility] on December 3 acutely ill with evidence of sepsis.  He was found to have C. difficile colitis.  He was in acute renal failure upon presentation and his renal function failed to improve.  Consultation was sought with Dr. Justin Mend per telephone and recommendation was made for transfer to [facility].  On initial presentation his INR was markedly elevated at 5.8 and increased to 8.4 on the day after admission.  At that point he received treatment w/ vitamin K.  On the day prior to transfer INR was down to 2.5.     Significant Events: 12/3 admit to [facility] c/ sepsis due to C. Diff 12/6 transfer to [facility]  12/7 flexiseal inserted (29 day limit)  Subjective: Pt is moaning out reporting that his "butt hurts."  He denies cp, sob, n/v, or abdom pain.  Is alert but confused.    Assessment & Plan:  Acute kidney injury superimposed on chronic kidney disease stage III Nephrology is attending to this issue - felt to be ATN due to sepsis - crt is slowly improving   Recent Labs  Lab 09/16/17 1025 09/17/17 0310 09/18/17 0252  CREATININE 4.46* 4.67* 4.28*      Acute on chronic combined systolic and diastolic congestive heart failure EF of 30-35% - follow Is/Os and daily weights - attempts to diurese ongoing - net negative ~2.5L thus far   [employer]   09/16/17 0900 09/17/17 0424 09/18/17 0342  Weight: 112.2 kg (247 lb  5.7 oz) 111.9 kg (246 lb 11.1 oz) 109.9 kg (242 lb 4.6 oz)    Chronic Paroxysmal atrial fibrillation w/ acute RVR on warfarin chronically Warfarin per Pharmacy - HR remains controlled - INR climbing again - follow daily  Hyponatremia Due to acute kidney injury, CHF, and free water excretion defect - slowly improving as we diurese   C. difficile colitis Cont vancomycin - placed flexiseal to minimize skin damage in perineum   DM2 with left diabetic foot ulcer CBG controlled   Multiple cutaneous injuries  right lower extremity venous stasis ulcer, left foot diabetic ulcer, and a stage II sacral injury with deep tissue damage - WOC has been consulted   Coronary artery disease No evidence of acute coronary syndrome   Anemia of chronic kidney disease + acute illness Hgb stable   MRSA screen +  DVT prophylaxis: warfarin  Code Status: FULL CODE Family Communication: no family present at time of exam  Disposition Plan: SDU  Consultants:  Nephrology   Antimicrobials:  Vancomycin 12/6 >  Objective: Blood pressure (!) 115/53, pulse 70, temperature 97.6 F (36.4 C), resp. rate 18, height 6' (1.829 m), weight 109.9 kg (242 lb 4.6 oz), SpO2 98 %.  Intake/Output Summary (Last 24 hours) at 09/18/2017 0933 Last data filed at 09/18/2017 0800 Gross per 24 hour  Intake 86 ml  Output 2450 ml  Net -2364 ml   Filed Weights   09/16/17 0900 09/17/17  0424 09/18/17 0342  Weight: 112.2 kg (247 lb 5.7 oz) 111.9 kg (246 lb 11.1 oz) 109.9 kg (242 lb 4.6 oz)    Examination: General: No acute respiratory distress - alert but confused  Lungs: Clear to auscultation bilaterally - no wheezing  Cardiovascular: Regular rate without murmur gallop or rub  Abdomen: modestly distended, soft, bowel sounds positive, no rebound Extremities: trace B LE edema   CBC: Recent Labs  Lab 09/16/17 1025 09/17/17 0310 09/18/17 0252  WBC 29.6* 22.3* 20.9*  NEUTROABS 27.5*  --   --   HGB 10.2* 9.4* 10.2*    HCT 30.3* 28.9* 31.7*  MCV 89.6 89.8 91.4  PLT 293 256 818   Basic Metabolic Panel: Recent Labs  Lab 09/16/17 1025 09/17/17 0310 09/18/17 0252  NA 126* 127* 128*  K 4.2 4.2 4.2  CL 89* 88* 88*  CO2 24 24 24   GLUCOSE 165* 107* 106*  BUN 90* 93* 98*  CREATININE 4.46* 4.67* 4.28*  CALCIUM 7.3* 7.6* 7.8*  MG 2.1  --   --   PHOS 6.3*  --  5.9*   GFR: Estimated Creatinine Clearance: 20 mL/min (A) (by C-G formula based on SCr of 4.28 mg/dL (H)).  Liver Function Tests: Recent Labs  Lab 09/16/17 1025 09/17/17 0310 09/18/17 0252 09/18/17 0316  AST 19 18  --  23  ALT 12* 12*  --  12*  ALKPHOS 144* 138*  --  160*  BILITOT 0.9 1.1  --  1.0  PROT 4.5* 4.9*  --  5.4*  ALBUMIN 1.4* 1.9* 2.0* 2.0*    Coagulation Profile: Recent Labs  Lab 09/16/17 1025 09/17/17 0310 09/18/17 0252  INR 1.88 2.62 4.34*    HbA1C: Hgb A1c MFr Bld  Date/Time Value Ref Range Status  09/16/2017 10:25 AM 6.1 (H) 4.8 - 5.6 % Final    Comment:    (NOTE) Pre diabetes:          5.7%-6.4% Diabetes:              >6.4% Glycemic control for   <7.0% adults with diabetes   03/24/2017 04:04 AM 6.7 (H) 4.8 - 5.6 % Final    Comment:    (NOTE)         Pre-diabetes: 5.7 - 6.4         Diabetes: >6.4         Glycemic control for adults with diabetes: <7.0     CBG: Recent Labs  Lab 09/17/17 0824 09/17/17 1150 09/17/17 1612 09/17/17 2124 09/18/17 0820  GLUCAP 106* 110* 125* 105* 103*    Recent Results (from the past 240 hour(s))  MRSA PCR Screening     Status: Abnormal   Collection Time: 09/16/17 10:38 AM  Result Value Ref Range Status   MRSA by PCR POSITIVE (A) NEGATIVE Final    Comment:        The GeneXpert MRSA Assay (FDA approved for NASAL specimens only), is one component of a comprehensive MRSA colonization surveillance program. It is not intended to diagnose MRSA infection nor to guide or monitor treatment for MRSA infections. RESULT CALLED TO, READ BACK BY AND VERIFIED  WITH: H. Deaton RN 12:20 09/16/17 ([email or web address])   C difficile quick scan w PCR reflex     Status: Abnormal   Collection Time: 09/16/17  1:24 PM  Result Value Ref Range Status   C Diff antigen POSITIVE (A) NEGATIVE Final   C Diff toxin NEGATIVE NEGATIVE Final   C Diff interpretation Results  are indeterminate. See PCR results.  Final  Clostridium Difficile by PCR     Status: Abnormal   Collection Time: 09/16/17  1:24 PM  Result Value Ref Range Status   Toxigenic C Difficile by pcr POSITIVE (A) NEGATIVE Final    Comment: Positive for toxigenic C. difficile with little to no toxin production. Only treat if clinical presentation suggests symptomatic illness.     Scheduled Meds: . amiodarone  200 mg Oral BID  . atorvastatin  40 mg Oral QHS  . Chlorhexidine Gluconate Cloth  6 each Topical Q0600  . feeding supplement (PRO-STAT SUGAR FREE 64)  30 mL Oral TID BM  . ferrous sulfate  325 mg Oral Q breakfast  . insulin aspart  0-9 Units Subcutaneous TID WC  . mouth rinse  15 mL Mouth Rinse BID  . mupirocin ointment  1 application Nasal BID  . pantoprazole  20 mg Oral Daily  . sodium chloride flush  3 mL Intravenous Q12H  . traZODone  50 mg Oral QHS  . vancomycin  500 mg Oral Q6H  . vitamin B-12  1,000 mcg Oral Daily     LOS: 2 days   [name], MD [facility] Office  [phone] Pager - Text Page per Amion as per below:  On-Call/Text Page:      [email or web address]      password [email or web address]  If 7PM-7AM, please contact night-coverage [email or web address] Password [email or web address] 09/18/2017, 9:33 AM

## 2017-09-18 NOTE — Progress Notes (Signed)
[place] for heparin Indication: atrial fibrillation  Allergies  Allergen Reactions  . Metolazone     Per pt family, pt gets delirious and confused when taking metolazone    Patient Measurements: Height: 6' (182.9 cm)(estimate d/t pt unable to answer) Weight: 242 lb 4.6 oz (109.9 kg) IBW/kg (Calculated) : 77.6 Heparin Dosing Weight: 101.6 kg  Vital Signs: Temp: 98.6 F (37 C) (12/08 1300) Temp Source: Bladder (12/08 0342) BP: 109/47 (12/08 1300) Pulse Rate: 73 (12/08 1300)  Labs: Recent Labs    09/16/17 1025 09/16/17 1958 09/17/17 0310 09/17/17 0726 09/18/17 0252  HGB 10.2*  --  9.4*  --  10.2*  HCT 30.3*  --  28.9*  --  31.7*  PLT 293  --  256  --  276  APTT 38*  --   --   --   --   LABPROT 21.4*  --  27.8*  --  41.2*  INR 1.88  --  2.62  --  4.34*  HEPARINUNFRC  --  0.13*  --  0.28*  --   CREATININE 4.46*  --  4.67*  --  4.28*    Estimated Creatinine Clearance: 20 mL/min (A) (by C-G formula based on SCr of 4.28 mg/dL (H)).   Medical History: Past Medical History:  Diagnosis Date  . CAD (coronary artery disease)   . Cellulitis and abscess of lower extremity   . CHF (congestive heart failure) (HCC)    EF 30%  . Chronic kidney disease, stage III (moderate) (HCC)   . DM (diabetes mellitus) [name])     Assessment: 71 yo male on warfarin PTA from [facility].  Initially placed on IV heparin upon arrival, but INR continued to rise and heparin was stopped.  Currently still elevated.  No overt bleeding or complications noted, CBC stable.  Goal of Therapy:  INR 2-3 Heparin level 0.3-0.7 units/ml Monitor platelets by anticoagulation protocol: Yes   Plan:  1. Continue holding heparin/Coumadin for now. 2. INR in AM.  [place] Crane, Vena [place], BCPS  Clinical Pharmacist Pager [phone]  09/18/2017 2:39 PM

## 2017-09-19 LAB — CBC
HEMATOCRIT: 29.8 % — AB (ref 39.0–52.0)
HEMOGLOBIN: 9.5 g/dL — AB (ref 13.0–17.0)
MCH: 29.1 pg (ref 26.0–34.0)
MCHC: 31.9 g/dL (ref 30.0–36.0)
MCV: 91.4 fL (ref 78.0–100.0)
Platelets: 258 10*3/uL (ref 150–400)
RBC: 3.26 MIL/uL — ABNORMAL LOW (ref 4.22–5.81)
RDW: 17.4 % — ABNORMAL HIGH (ref 11.5–15.5)
WBC: 16.2 10*3/uL — ABNORMAL HIGH (ref 4.0–10.5)

## 2017-09-19 LAB — RENAL FUNCTION PANEL
Albumin: 1.7 g/dL — ABNORMAL LOW (ref 3.5–5.0)
Anion gap: 11 (ref 5–15)
BUN: 102 mg/dL — ABNORMAL HIGH (ref 6–20)
CHLORIDE: 91 mmol/L — AB (ref 101–111)
CO2: 29 mmol/L (ref 22–32)
CREATININE: 3.9 mg/dL — AB (ref 0.61–1.24)
Calcium: 7.8 mg/dL — ABNORMAL LOW (ref 8.9–10.3)
GFR, EST AFRICAN AMERICAN: 16 mL/min — AB (ref >60)
GFR, EST NON AFRICAN AMERICAN: 14 mL/min — AB (ref >60)
Glucose, Bld: 126 mg/dL — ABNORMAL HIGH (ref 65–99)
POTASSIUM: 3.9 mmol/L (ref 3.5–5.1)
Phosphorus: 5.5 mg/dL — ABNORMAL HIGH (ref 2.5–4.6)
Sodium: 131 mmol/L — ABNORMAL LOW (ref 135–145)

## 2017-09-19 LAB — GLUCOSE, CAPILLARY
GLUCOSE-CAPILLARY: 120 mg/dL — AB (ref 65–99)
Glucose-Capillary: 109 mg/dL — ABNORMAL HIGH (ref 65–99)
Glucose-Capillary: 144 mg/dL — ABNORMAL HIGH (ref 65–99)

## 2017-09-19 LAB — PROTIME-INR
INR: 5.78 — AB
Prothrombin Time: 47.8 seconds — ABNORMAL HIGH (ref 11.4–15.2)

## 2017-09-19 MED ORDER — FUROSEMIDE 10 MG/ML IJ SOLN
80.0000 mg | Freq: Two times a day (BID) | INTRAMUSCULAR | Status: DC
  Administered 2017-09-19 – 2017-09-21 (×4): 80 mg via INTRAVENOUS
  Filled 2017-09-19 (×4): qty 8

## 2017-09-19 MED ORDER — HYDROCORTISONE ACETATE 25 MG RE SUPP
25.0000 mg | Freq: Two times a day (BID) | RECTAL | Status: DC
  Administered 2017-09-19 – 2017-09-30 (×17): 25 mg via RECTAL
  Filled 2017-09-19 (×25): qty 1

## 2017-09-19 MED ORDER — PHYTONADIONE 5 MG PO TABS
2.5000 mg | ORAL_TABLET | Freq: Once | ORAL | Status: AC
  Administered 2017-09-19: 2.5 mg via ORAL
  Filled 2017-09-19 (×2): qty 1

## 2017-09-19 NOTE — Progress Notes (Signed)
Subjective:  Blood pressure a little soft still , 1475 of UOP !! Crt down , BUN up slightly.  Telemedicine note today due to weather  Objective Vital signs in last 24 hours: Vitals:   09/19/17 0000 09/19/17 0350 09/19/17 0400 09/19/17 0526  BP: (!) 107/47 (!) 98/45 (!) 99/45   Pulse: 70 64 63   Resp: 20 14 15    Temp: 98.4 F (36.9 C) 98.4 F (36.9 C) 98.4 F (36.9 C)   TempSrc:  Core    SpO2: 97% 99% 97%   Weight:    108.4 kg (238 lb 15.7 oz)  Height:       Weight change: -1.5 kg (-4.9 oz)  Intake/Output Summary (Last 24 hours) at 09/19/2017 0819 Last data filed at 09/19/2017 3500 Gross per 24 hour  Intake 126 ml  Output 1575 ml  Net -1449 ml   Assessment/Plan: 1. Acute kidney injury on chronic kidney disease stage III:  Consistent with sepsis associated ATN that is further complicated by his decreased EF with now evidence of volume overload and possible acute cardiorenal syndrome. Difficult situation with regards to management as hypotension can make diuresis challenging. He does have edema- furosemide increased to 160 mg IV q 8 hours with good urine output-he has significant hypoalbuminemia so is third spacing as well. No indications for HD at present but BUN rising- could make a case for supportive short-term hemodialysis (IHD versus CVVH depending on blood pressures) only after extensive discussions with next of kin. Would not offer long-term hemodialysis given his prior history of debilitation and limited functional status. Avoiding nephrotoxic medications including NSAIDs and contrast exposure. I am going to decrease lasix due to rising BUN to 80 BID 2. Hyponatremia: Secondary to acute kidney injury/congestive heart failure and free water excretion defect-attempt at this time to optimize diuretic therapy. Improving slowly 3. Anemia: Likely anemia of chronic disease versus acute illness-check iron studies and monitor his hemoglobin/hematocrit trend. No indications for PRBC or ESA at  this time. No iron due to sepsis 4. Clostridium difficile colitis/sepsis: Remains relatively hypotensive and currently on oral vancomycin. Follow stool frequency/symptom improvement. WBC trending down 5. Paroxysmal atrial fibrillation 6. Hypoalbuminemia/protein calorie malnutrition/debilitation-deconditioning    Jairen Goldfarb A    Labs: Basic Metabolic Panel: Recent Labs  Lab 09/16/17 1025 09/17/17 0310 09/18/17 0252 09/19/17 0309  NA 126* 127* 128* 131*  K 4.2 4.2 4.2 3.9  CL 89* 88* 88* 91*  CO2 24 24 24 29   GLUCOSE 165* 107* 106* 126*  BUN 90* 93* 98* 102*  CREATININE 4.46* 4.67* 4.28* 3.90*  CALCIUM 7.3* 7.6* 7.8* 7.8*  PHOS 6.3*  --  5.9* 5.5*   Liver Function Tests: Recent Labs  Lab 09/16/17 1025 09/17/17 0310 09/18/17 0252 09/18/17 0316 09/19/17 0309  AST 19 18  --  23  --   ALT 12* 12*  --  12*  --   ALKPHOS 144* 138*  --  160*  --   BILITOT 0.9 1.1  --  1.0  --   PROT 4.5* 4.9*  --  5.4*  --   ALBUMIN 1.4* 1.9* 2.0* 2.0* 1.7*   No results for input(s): LIPASE, AMYLASE in the last 168 hours. No results for input(s): AMMONIA in the last 168 hours. CBC: Recent Labs  Lab 09/16/17 1025 09/17/17 0310 09/18/17 0252 09/19/17 0309  WBC 29.6* 22.3* 20.9* 16.2*  NEUTROABS 27.5*  --   --   --   HGB 10.2* 9.4* 10.2* 9.5*  HCT 30.3* 28.9*  31.7* 29.8*  MCV 89.6 89.8 91.4 91.4  PLT 293 256 276 258   Cardiac Enzymes: No results for input(s): CKTOTAL, CKMB, CKMBINDEX, TROPONINI in the last 168 hours. CBG: Recent Labs  Lab 09/17/17 2124 09/18/17 0820 09/18/17 1110 09/18/17 1636 09/18/17 2124  GLUCAP 105* 103* 116* 123* 140*    Iron Studies:  Recent Labs    09/16/17 1411  IRON 24*  TIBC 122*  FERRITIN 692*   Studies/Results: No results found. Medications: Infusions: . furosemide Stopped (09/19/17 0330)    Scheduled Medications: . amiodarone  200 mg Oral BID  . atorvastatin  40 mg Oral QHS  . Chlorhexidine Gluconate Cloth  6 each  Topical Q0600  . feeding supplement (PRO-STAT SUGAR FREE 64)  30 mL Oral TID BM  . ferrous sulfate  325 mg Oral Q breakfast  . insulin aspart  0-9 Units Subcutaneous TID WC  . mouth rinse  15 mL Mouth Rinse BID  . mupirocin ointment  1 application Nasal BID  . pantoprazole  20 mg Oral Daily  . sodium chloride flush  3 mL Intravenous Q12H  . traZODone  50 mg Oral QHS  . vancomycin  500 mg Oral Q6H  . vitamin B-12  1,000 mcg Oral Daily    have reviewed scheduled and prn medications.  Physical Exam:- not done today- telemedicine note due to weather    09/19/2017,8:19 AM  LOS: 3 days

## 2017-09-19 NOTE — Progress Notes (Signed)
[place] for heparin Indication: atrial fibrillation  Allergies  Allergen Reactions  . Metolazone     Per pt family, pt gets delirious and confused when taking metolazone    Patient Measurements: Height: 6' (182.9 cm)(estimate d/t pt unable to answer) Weight: 238 lb 15.7 oz (108.4 kg) IBW/kg (Calculated) : 77.6 Heparin Dosing Weight: 101.6 kg  Vital Signs: Temp: 98.8 F (37.1 C) (12/09 0800) Temp Source: Core (12/09 0350) BP: 91/47 (12/09 0800) Pulse Rate: 67 (12/09 0800)  Labs: Recent Labs    09/16/17 1958  09/17/17 0310 09/17/17 0726 09/18/17 0252 09/19/17 0309  HGB  --    < > 9.4*  --  10.2* 9.5*  HCT  --   --  28.9*  --  31.7* 29.8*  PLT  --   --  256  --  276 258  LABPROT  --   --  27.8*  --  41.2* 47.8*  INR  --   --  2.62  --  4.34* 5.78*  HEPARINUNFRC 0.13*  --   --  0.28*  --   --   CREATININE  --   --  4.67*  --  4.28* 3.90*   < > = values in this interval not displayed.    Estimated Creatinine Clearance: 21.8 mL/min (A) (by C-G formula based on SCr of 3.9 mg/dL (H)).   Medical History: Past Medical History:  Diagnosis Date  . CAD (coronary artery disease)   . Cellulitis and abscess of lower extremity   . CHF (congestive heart failure) (HCC)    EF 30%  . Chronic kidney disease, stage III (moderate) (HCC)   . DM (diabetes mellitus) [facility])     Assessment: 71 yo male on warfarin PTA from [facility].  Initially placed on IV heparin upon arrival, but INR continued to rise and heparin was stopped.  Currently still elevated, and rising.  No overt bleeding or complications noted, CBC stable.  Vitamin K 2.5 mg orally was given today.  Goal of Therapy:  INR 2-3 Heparin level 0.3-0.7 units/ml Monitor platelets by anticoagulation protocol: Yes   Plan:  1. Continue holding heparin/Coumadin for now. 2. INR in AM.  [place] Crane, Vena [place], BCPS  Clinical Pharmacist Pager [phone]  09/19/2017 2:47  PM

## 2017-09-19 NOTE — Progress Notes (Signed)
[place] TEAM 1 - Stepdown/ICU TEAM  [facility]  IZT:[id number] DOB: 12/05/1945 DOA: 09/16/2017 PCP: [name], MD    Brief Narrative:  71 y.o. male with history of systolic CHF with an EF of 30% on home oxygen, CAD s/p PTCA greater than 1 year ago, atrial fibrillation chronically anticoagulated with warfarin, CKD with a baseline creatinine of 1.3 (hemodialysis during admission in January 2018), and DM2 with chronic diabetic foot ulcer who presented in transfer from [facility] for evaluation of acute on chronic renal failure.  Patient presented to [facility] on December 3 acutely ill with evidence of sepsis.  He was found to have C. difficile colitis.  He was in acute renal failure upon presentation and his renal function failed to improve.  Consultation was sought with Dr. Justin Mend per telephone and recommendation was made for transfer to [facility].  On initial presentation his INR was markedly elevated at 5.8 and increased to 8.4 on the day after admission.  At that point he received treatment w/ vitamin K.  On the day prior to transfer INR was down to 2.5.     Significant Events: 12/3 admit to [facility] w/ sepsis due to C. Diff 12/6 transfer to [facility]  12/7 flexiseal inserted (29 day limit) 12/9 dosed w/ vitamin K  Subjective: Pt is sedate this morning at the time of my exam.  His RN reports that he has been awake this morning, and was able to eat some breakfast.  He in in no acute resp distress or signif pain.    Assessment & Plan:  Acute kidney injury superimposed on chronic kidney disease stage III Nephrology is attending to this issue - felt to be ATN due to sepsis - crt continues to slowly improve  Recent Labs  Lab 09/16/17 1025 09/17/17 0310 09/18/17 0252 09/19/17 0309  CREATININE 4.46* 4.67* 4.28* 3.90*      Acute on chronic combined systolic and diastolic congestive heart failure EF 30-35% - follow Is/Os and daily weights - attempts to diurese  ongoing - net negative ~3.7L thus far - 1500cc UOP last 24hrs   Filed Weights   09/17/17 0424 09/18/17 0342 09/19/17 0526  Weight: 111.9 kg (246 lb 11.1 oz) 109.9 kg (242 lb 4.6 oz) 108.4 kg (238 lb 15.7 oz)    Chronic Paroxysmal atrial fibrillation w/ acute RVR on warfarin chronically Warfarin per Pharmacy - HR controlled - INR still climbing   Warfarin induced coagulopathy INR climbing over last 2-3 days w/o dosing of warfarin - now that >5 will dose w/ low dose oral vitamin K - no gross blood loss evident at this time -suspect this is due to poor nutritional state +/- acute renal failure   Hyponatremia Due to acute kidney injury, CHF, and free water excretion defect - improving as we diurese   C. difficile colitis Cont vancomycin - placed flexiseal to minimize skin damage in perineum   DM2 with left diabetic foot ulcer CBG controlled   Multiple cutaneous injuries  right lower extremity venous stasis ulcer, left foot diabetic ulcer, and a stage II sacral injury with deep tissue damage - WOC has been consulted   Coronary artery disease No evidence of acute coronary syndrome   Anemia of chronic kidney disease + acute illness Hgb stable   MRSA screen +  DVT prophylaxis: warfarin  Code Status: FULL CODE Family Communication: no family present at time of exam  Disposition Plan: SDU  Consultants:  Nephrology   Antimicrobials:  Vancomycin 12/6 >  Objective: Blood pressure (!) 99/45, pulse 63, temperature 98.4 F (36.9 C), resp. rate 15, height 6' (1.829 m), weight 108.4 kg (238 lb 15.7 oz), SpO2 97 %.  Intake/Output Summary (Last 24 hours) at 09/19/2017 0947 Last data filed at 09/19/2017 6237 Gross per 24 hour  Intake 126 ml  Output 1575 ml  Net -1449 ml   Filed Weights   09/17/17 0424 09/18/17 0342 09/19/17 0526  Weight: 111.9 kg (246 lb 11.1 oz) 109.9 kg (242 lb 4.6 oz) 108.4 kg (238 lb 15.7 oz)    Examination: General: No acute respiratory distress -  sedate/sleeping Lungs: CTA B - poor air movement B bases   Cardiovascular: RRR - no M  Abdomen: modestly distended, soft, bowel sounds positive, no rebound, no mass  Extremities: trace B LE edema w/o change   CBC: Recent Labs  Lab 09/16/17 1025 09/17/17 0310 09/18/17 0252 09/19/17 0309  WBC 29.6* 22.3* 20.9* 16.2*  NEUTROABS 27.5*  --   --   --   HGB 10.2* 9.4* 10.2* 9.5*  HCT 30.3* 28.9* 31.7* 29.8*  MCV 89.6 89.8 91.4 91.4  PLT 293 256 276 628   Basic Metabolic Panel: Recent Labs  Lab 09/16/17 1025 09/17/17 0310 09/18/17 0252 09/19/17 0309  NA 126* 127* 128* 131*  K 4.2 4.2 4.2 3.9  CL 89* 88* 88* 91*  CO2 24 24 24 29   GLUCOSE 165* 107* 106* 126*  BUN 90* 93* 98* 102*  CREATININE 4.46* 4.67* 4.28* 3.90*  CALCIUM 7.3* 7.6* 7.8* 7.8*  MG 2.1  --   --   --   PHOS 6.3*  --  5.9* 5.5*   GFR: Estimated Creatinine Clearance: 21.8 mL/min (A) (by C-G formula based on SCr of 3.9 mg/dL (H)).  Liver Function Tests: Recent Labs  Lab 09/16/17 1025 09/17/17 0310 09/18/17 0252 09/18/17 0316 09/19/17 0309  AST 19 18  --  23  --   ALT 12* 12*  --  12*  --   ALKPHOS 144* 138*  --  160*  --   BILITOT 0.9 1.1  --  1.0  --   PROT 4.5* 4.9*  --  5.4*  --   ALBUMIN 1.4* 1.9* 2.0* 2.0* 1.7*    Coagulation Profile: Recent Labs  Lab 09/16/17 1025 09/17/17 0310 09/18/17 0252 09/19/17 0309  INR 1.88 2.62 4.34* 5.78*    HbA1C: Hgb A1c MFr Bld  Date/Time Value Ref Range Status  09/16/2017 10:25 AM 6.1 (H) 4.8 - 5.6 % Final    Comment:    (NOTE) Pre diabetes:          5.7%-6.4% Diabetes:              >6.4% Glycemic control for   <7.0% adults with diabetes   03/24/2017 04:04 AM 6.7 (H) 4.8 - 5.6 % Final    Comment:    (NOTE)         Pre-diabetes: 5.7 - 6.4         Diabetes: >6.4         Glycemic control for adults with diabetes: <7.0     CBG: Recent Labs  Lab 09/18/17 0820 09/18/17 1110 09/18/17 1636 09/18/17 2124 09/19/17 0926  GLUCAP 103* 116* 123*  140* 120*    Recent Results (from the past 240 hour(s))  MRSA PCR Screening     Status: Abnormal   Collection Time: 09/16/17 10:38 AM  Result Value Ref Range Status   MRSA by PCR POSITIVE (  A) NEGATIVE Final    Comment:        The GeneXpert MRSA Assay (FDA approved for NASAL specimens only), is one component of a comprehensive MRSA colonization surveillance program. It is not intended to diagnose MRSA infection nor to guide or monitor treatment for MRSA infections. RESULT CALLED TO, READ BACK BY AND VERIFIED WITH: H. Deaton RN 12:20 09/16/17 ([email or web address])   C difficile quick scan w PCR reflex     Status: Abnormal   Collection Time: 09/16/17  1:24 PM  Result Value Ref Range Status   C Diff antigen POSITIVE (A) NEGATIVE Final   C Diff toxin NEGATIVE NEGATIVE Final   C Diff interpretation Results are indeterminate. See PCR results.  Final  Clostridium Difficile by PCR     Status: Abnormal   Collection Time: 09/16/17  1:24 PM  Result Value Ref Range Status   Toxigenic C Difficile by pcr POSITIVE (A) NEGATIVE Final    Comment: Positive for toxigenic C. difficile with little to no toxin production. Only treat if clinical presentation suggests symptomatic illness.     Scheduled Meds: . amiodarone  200 mg Oral BID  . atorvastatin  40 mg Oral QHS  . Chlorhexidine Gluconate Cloth  6 each Topical Q0600  . feeding supplement (PRO-STAT SUGAR FREE 64)  30 mL Oral TID BM  . ferrous sulfate  325 mg Oral Q breakfast  . furosemide  80 mg Intravenous Q12H  . insulin aspart  0-9 Units Subcutaneous TID WC  . mouth rinse  15 mL Mouth Rinse BID  . mupirocin ointment  1 application Nasal BID  . pantoprazole  20 mg Oral Daily  . sodium chloride flush  3 mL Intravenous Q12H  . traZODone  50 mg Oral QHS  . vancomycin  500 mg Oral Q6H  . vitamin B-12  1,000 mcg Oral Daily     LOS: 3 days   [name], MD [facility] Office  [phone] Pager - Text Page per Amion as per  below:  On-Call/Text Page:      [email or web address]      password [email or web address]  If 7PM-7AM, please contact night-coverage [email or web address] Password [email or web address] 09/19/2017, 9:47 AM

## 2017-09-19 NOTE — NC FL2 (Signed)
[place] LEVEL OF CARE SCREENING TOOL     IDENTIFICATION  Patient Name: Christian Garner Birthdate: [birth year] Sex: male Admission Date (Current Location): 09/16/2017  [facility] and [place] Number:  [employer] and Address:  [facility] [place], [place] [address], [place], [place] [address]      Provider Number: [id number]  Attending Physician Name and Address:  [name], MD  Relative Name and Phone Number:       Current Level of Care: Hospital Recommended Level of Care: [place] Prior Approval Number:    Date Approved/Denied:   PASRR Number: [id number]  Discharge Plan: SNF    Current Diagnoses: Patient Active Problem List   Diagnosis Date Noted  . C. difficile colitis 09/16/2017  . Warfarin anticoagulation 09/16/2017  . Type 2 diabetes mellitus with left diabetic foot ulcer ([place]) 09/16/2017  . Anemia due to chronic kidney disease 09/16/2017  . CKD (chronic kidney disease), stage III ([place]) 09/16/2017  . CAD (coronary artery disease) 06/07/2017  . Recurrent pleural effusion on right   . S/P thoracentesis   . Cellulitis of left lower extremity   . Goals of care, counseling/discussion   . Palliative care encounter   . Hyponatremia   . Pressure injury of skin 03/20/2017  . Acute on chronic combined systolic and diastolic CHF (congestive heart failure) ([place]) 03/19/2017  . Acute on chronic respiratory failure with hypoxia and hypercapnia ([place]) 03/19/2017  . AF (paroxysmal atrial fibrillation) ([place]) 03/19/2017  . Community acquired pneumonia 03/19/2017  . Pleural effusion   . Acute kidney injury superimposed on chronic kidney disease ([place])     Orientation RESPIRATION BLADDER Height & Weight     Self  Normal Incontinent, External catheter(catheter placed 09/16/17) Weight: 238 lb 15.7 oz (108.4 kg) Height:  6' (182.9 cm)(estimate d/t pt unable to answer)  BEHAVIORAL SYMPTOMS/MOOD NEUROLOGICAL BOWEL NUTRITION  STATUS      Incontinent(rectal tube 09/17/17) Diet(fluid restriction to 1200 mL)  AMBULATORY STATUS COMMUNICATION OF NEEDS Skin   Extensive Assist Verbally PU Stage and Appropriate Care, Surgical wounds(incision on left foot from toe amputation in 9/18, form dressing; venous stasis ulcer right leg, foam dressing)   PU Stage 2 Dressing: BID(sacrum, foam dressing)                   Personal Care Assistance Level of Assistance  Bathing, Feeding, Dressing Bathing Assistance: Maximum assistance Feeding assistance: Maximum assistance Dressing Assistance: Maximum assistance     Functional Limitations Info  Sight, Hearing, Speech   Hearing Info: Adequate Speech Info: Adequate    SPECIAL CARE FACTORS FREQUENCY  PT (By licensed PT), OT (By licensed OT)     PT Frequency: 5x/wk OT Frequency: 5x/wk            Contractures Contractures Info: Not present    Additional Factors Info  Code Status, Allergies, Insulin Sliding Scale Code Status Info: Full Allergies Info: Metolazone   Insulin Sliding Scale Info: 0-9 units 3x/day with meals       Current Medications (09/19/2017):  This is the current hospital active medication list Current Facility-Administered Medications  Medication Dose Route Frequency Provider Last Rate Last Dose  . acetaminophen (TYLENOL) tablet 650 mg  650 mg Oral Q6H PRN Lady Deutscher, MD       Or  . acetaminophen (TYLENOL) suppository 650 mg  650 mg Rectal Q6H PRN Lady Deutscher, MD      . amiodarone (PACERONE) tablet  200 mg  200 mg Oral BID Lady Deutscher, MD   200 mg at 09/19/17 6834  . atorvastatin (LIPITOR) tablet 40 mg  40 mg Oral QHS Lady Deutscher, MD   40 mg at 09/18/17 2210  . baclofen (LIORESAL) 10 mg/mL oral suspension 10 mg  10 mg Oral TID PRN Lady Deutscher, MD      . bismuth subsalicylate (PEPTO BISMOL) 262 MG/15ML suspension 30 mL  30 mL Oral Q6H PRN Lady Deutscher, MD      . calcium carbonate (TUMS - dosed in mg  elemental calcium) chewable tablet 200 mg of elemental calcium  1 tablet Oral Daily PRN Lady Deutscher, MD      . Chlorhexidine Gluconate Cloth 2 % PADS 6 each  6 each Topical Q0600 [name], MD   6 each at 09/19/17 [phone]  . feeding supplement (PRO-STAT SUGAR FREE 64) liquid 30 mL  30 mL Oral TID BM Lady Deutscher, MD   30 mL at 09/19/17 1307  . ferrous sulfate tablet 325 mg  325 mg Oral Q breakfast Lady Deutscher, MD   325 mg at 09/19/17 2297  . fluticasone (FLONASE) 50 MCG/ACT nasal spray 1 spray  1 spray Each Nare Daily PRN Lady Deutscher, MD      . furosemide (LASIX) injection 80 mg  80 mg Intravenous Q12H Corliss Parish, MD      . insulin aspart (novoLOG) injection 0-9 Units  0-9 Units Subcutaneous TID WC Lady Deutscher, MD   1 Units at 09/17/17 1840  . MEDLINE mouth rinse  15 mL Mouth Rinse BID Lady Deutscher, MD   15 mL at 09/19/17 0926  . mupirocin ointment (BACTROBAN) 2 % 1 application  1 application Nasal BID [name], MD   1 application at 98/92/11 [phone]  . nitroGLYCERIN (NITROSTAT) SL tablet 0.4 mg  0.4 mg Sublingual Q5 min PRN Lady Deutscher, MD      . ondansetron [facility]) tablet 4 mg  4 mg Oral Q6H PRN Lady Deutscher, MD       Or  . ondansetron [facility]) injection 4 mg  4 mg Intravenous Q6H PRN Lady Deutscher, MD      . oxyCODONE (Oxy IR/ROXICODONE) immediate release tablet 5 mg  5 mg Oral Q4H PRN Lady Deutscher, MD   5 mg at 09/18/17 0830  . pantoprazole (PROTONIX) EC tablet 20 mg  20 mg Oral Daily Lady Deutscher, MD   20 mg at 09/19/17 4081  . sodium chloride (OCEAN) 0.65 % nasal spray 2 spray  2 spray Each Nare TID PRN Lady Deutscher, MD      . sodium chloride flush (NS) 0.9 % injection 3 mL  3 mL Intravenous Q12H Lady Deutscher, MD   3 mL at 09/19/17 0924  . traZODone (DESYREL) tablet 50 mg  50 mg Oral QHS Lady Deutscher, MD   50 mg at 09/18/17 2210  . vancomycin (VANCOCIN) 50 mg/mL oral solution 500 mg   500 mg Oral Q6H Lady Deutscher, MD   500 mg at 09/19/17 1244  . vitamin B-12 (CYANOCOBALAMIN) tablet 1,000 mcg  1,000 mcg Oral Daily Lady Deutscher, MD   1,000 mcg at 09/19/17 4481     Discharge Medications: Please see discharge summary for a list of discharge medications.  Relevant Imaging Results:  Relevant Lab Results:   Additional Information SS#: [id number]  Geralynn Ochs, LCSW

## 2017-09-19 NOTE — Progress Notes (Signed)
CSW following to facilitate discharge planning. Per PT notes, recommendation is for SNF placement. Patient has had multiple admissions over the past 6 months, with last formal CSW assessment completed on 06/09/17. CSW has initiated referral for SNF placement and faxed to patient's facility preference.  CSW will continue to follow.  Laveda Abbe,  Clinical Social Worker [phone]

## 2017-09-19 NOTE — Progress Notes (Signed)
CRITICAL VALUE ALERT  Critical Value: INR 5.78 Date & Time Notied: 12/9 2018  Provider Notified: Christin Fudge  Orders Received/Actions taken: MD on floor and checked out orders. No new orders received.

## 2017-09-19 NOTE — Progress Notes (Signed)
Patient bleeding from his rectum.  RN assessed patient VSS with no s/s of distress noted.  Dr. Thereasa Solo updated on patients change in condition.  Will continue to monitor.

## 2017-09-20 LAB — PROTIME-INR
INR: 3.27
PROTHROMBIN TIME: 33.1 s — AB (ref 11.4–15.2)

## 2017-09-20 LAB — GLUCOSE, CAPILLARY
GLUCOSE-CAPILLARY: 146 mg/dL — AB (ref 65–99)
GLUCOSE-CAPILLARY: 123 mg/dL — AB (ref 65–99)
Glucose-Capillary: 104 mg/dL — ABNORMAL HIGH (ref 65–99)
Glucose-Capillary: 153 mg/dL — ABNORMAL HIGH (ref 65–99)

## 2017-09-20 LAB — RENAL FUNCTION PANEL
Albumin: 1.7 g/dL — ABNORMAL LOW (ref 3.5–5.0)
Anion gap: 11 (ref 5–15)
BUN: 106 mg/dL — ABNORMAL HIGH (ref 6–20)
CHLORIDE: 92 mmol/L — AB (ref 101–111)
CO2: 30 mmol/L (ref 22–32)
CREATININE: 3.69 mg/dL — AB (ref 0.61–1.24)
Calcium: 7.7 mg/dL — ABNORMAL LOW (ref 8.9–10.3)
GFR calc Af Amer: 17 mL/min — ABNORMAL LOW (ref >60)
GFR calc non Af Amer: 15 mL/min — ABNORMAL LOW (ref >60)
GLUCOSE: 161 mg/dL — AB (ref 65–99)
Phosphorus: 6 mg/dL — ABNORMAL HIGH (ref 2.5–4.6)
Potassium: 3.9 mmol/L (ref 3.5–5.1)
Sodium: 133 mmol/L — ABNORMAL LOW (ref 135–145)

## 2017-09-20 LAB — CBC
HCT: 31 % — ABNORMAL LOW (ref 39.0–52.0)
HEMOGLOBIN: 9.7 g/dL — AB (ref 13.0–17.0)
MCH: 29.1 pg (ref 26.0–34.0)
MCHC: 31.3 g/dL (ref 30.0–36.0)
MCV: 93.1 fL (ref 78.0–100.0)
PLATELETS: 240 10*3/uL (ref 150–400)
RBC: 3.33 MIL/uL — ABNORMAL LOW (ref 4.22–5.81)
RDW: 17.4 % — AB (ref 11.5–15.5)
WBC: 15.1 10*3/uL — ABNORMAL HIGH (ref 4.0–10.5)

## 2017-09-20 MED ORDER — SODIUM CHLORIDE 0.9 % IV SOLN
12.5000 mg | Freq: Three times a day (TID) | INTRAVENOUS | Status: DC | PRN
  Filled 2017-09-20: qty 0.5

## 2017-09-20 NOTE — Progress Notes (Signed)
[place] for heparin Indication: atrial fibrillation  Allergies  Allergen Reactions  . Metolazone     Per pt family, pt gets delirious and confused when taking metolazone    Patient Measurements: Height: 6' (182.9 cm)(estimate d/t pt unable to answer) Weight: 234 lb 12.6 oz (106.5 kg) IBW/kg (Calculated) : 77.6 Heparin Dosing Weight: 101.6 kg  Vital Signs: Temp: 98.6 F (37 C) (12/10 0800) Temp Source: Oral (12/10 0357) BP: 105/43 (12/10 0800) Pulse Rate: 63 (12/10 0800)  Labs: Recent Labs    09/18/17 0252 09/19/17 0309 09/20/17 0215  HGB 10.2* 9.5* 9.7*  HCT 31.7* 29.8* 31.0*  PLT 276 258 240  LABPROT 41.2* 47.8* 33.1*  INR 4.34* 5.78* 3.27  CREATININE 4.28* 3.90* 3.69*    Estimated Creatinine Clearance: 22.8 mL/min (A) (by C-G formula based on SCr of 3.69 mg/dL (H)).   Medical History: Past Medical History:  Diagnosis Date  . CAD (coronary artery disease)   . Cellulitis and abscess of lower extremity   . CHF (congestive heart failure) (HCC)    EF 30%  . Chronic kidney disease, stage III (moderate) (HCC)   . DM (diabetes mellitus) [facility])     Assessment: 71 yo male on warfarin PTA from [facility].  Initially placed on IV heparin upon arrival, but INR continued to rise and heparin was stopped.    Currently still elevated, and now trending down in  3s this morning.  No overt bleeding or complications noted, CBC stable.  Vitamin K 2.5 mg orally was given 12/9.  Goal of Therapy:  INR 2-3 Heparin level 0.3-0.7 units/ml Monitor platelets by anticoagulation protocol: Yes   Plan:  1. Continue holding heparin/Coumadin for now. 2. INR in AM.  Erin Hearing PharmD., BCPS Clinical Pharmacist Pager [phone] 09/20/2017 8:54 AM

## 2017-09-20 NOTE — Progress Notes (Signed)
[place] TEAM 1 - Stepdown/ICU TEAM  Christian Garner  QAS:[id number] DOB: 1946-03-03 DOA: 09/16/2017 PCP: [name], MD    Brief Narrative:  71 y.o. male with history of systolic CHF with an EF of 30% on home oxygen, CAD s/p PTCA greater than 1 year ago, atrial fibrillation chronically anticoagulated with warfarin, CKD with a baseline creatinine of 1.3 (hemodialysis during admission in January 2018), and DM2 with chronic diabetic foot ulcer who presented in transfer from [name] for evaluation of acute on chronic renal failure.  Patient presented to [facility] on December 3 acutely ill with evidence of sepsis.  He was found to have C. difficile colitis.  He was in acute renal failure upon presentation and his renal function failed to improve.  Consultation was sought with Dr. Justin Mend per telephone and recommendation was made for transfer to [name].  On initial presentation his INR was markedly elevated at 5.8 and increased to 8.4 on the day after admission.  At that point he received treatment w/ vitamin K.  On the day prior to transfer INR was down to 2.5.     Significant Events: 12/3 admit to [name] w/ sepsis due to C. Diff 12/6 transfer to [facility]  12/7 flexiseal inserted (29 day limit) 12/9 dosed w/ vitamin K  Subjective: More alert and conversant today.  C/o diffuse abdom crampy pain.  Denies sob, n/v, or ha.    Assessment & Plan:  Acute kidney injury superimposed on chronic kidney disease stage III Nephrology is attending to this issue - felt to be ATN due to sepsis - crt continues to slowly improve  Recent Labs  Lab 09/16/17 1025 09/17/17 0310 09/18/17 0252 09/19/17 0309 09/20/17 0215  CREATININE 4.46* 4.67* 4.28* 3.90* 3.69*      Acute on chronic combined systolic and diastolic congestive heart failure EF 30-35% - follow Is/Os and daily weights - attempts to diurese ongoing - net negative ~5L thus far - 1200cc UOP last 24hrs - clinically appears  well compensated at this time   [facility] Weights   09/18/17 0342 09/19/17 0526 09/20/17 0400  Weight: 109.9 kg (242 lb 4.6 oz) 108.4 kg (238 lb 15.7 oz) 106.5 kg (234 lb 12.6 oz)    Chronic Paroxysmal atrial fibrillation w/ acute RVR on warfarin chronically Warfarin per Pharmacy - HR controlled   Warfarin induced coagulopathy INR improved after 2.5mg  Vit K dose - cont to follow trend   Hyponatremia Due to acute kidney injury, CHF, and free water excretion defect - cont to improve as we diurese   C. difficile colitis Cont vancomycin - placed flexiseal to minimize skin damage in perineum   DM2 with left diabetic foot ulcer CBG controlled   Multiple cutaneous injuries  right lower extremity venous stasis ulcer, left foot diabetic ulcer, and a stage II sacral injury with deep tissue damage - WOC has been consulted   Coronary artery disease No evidence of acute coronary syndrome   Anemia of chronic kidney disease + acute illness Hgb stable   MRSA screen +  DVT prophylaxis: warfarin  Code Status: FULL CODE Family Communication: no family present at time of exam  Disposition Plan: SDU - will need SNF   Consultants:  Nephrology   Antimicrobials:  Vancomycin 12/6 >  Objective: Blood pressure (!) 105/43, pulse 63, temperature 98.6 F (37 C), temperature source Bladder, resp. rate 17, height 6' (1.829 m), weight 106.5 kg (234 lb 12.6 oz), SpO2 100 %.  Intake/Output Summary (Last  24 hours) at 09/20/2017 1019 Last data filed at 09/20/2017 0359 Gross per 24 hour  Intake -  Output 1200 ml  Net -1200 ml   Filed Weights   09/18/17 0342 09/19/17 0526 09/20/17 0400  Weight: 109.9 kg (242 lb 4.6 oz) 108.4 kg (238 lb 15.7 oz) 106.5 kg (234 lb 12.6 oz)    Examination: General: No acute respiratory distress - alert - grumpy  Lungs: CTA B w/o wheezing  Cardiovascular: RRR  Abdomen: modestly distended, soft, bowel sounds positive, no mass or rebound  Extremities: no signif LE  edema   CBC: Recent Labs  Lab 09/16/17 1025 09/17/17 0310 09/18/17 0252 09/19/17 0309 09/20/17 0215  WBC 29.6* 22.3* 20.9* 16.2* 15.1*  NEUTROABS 27.5*  --   --   --   --   HGB 10.2* 9.4* 10.2* 9.5* 9.7*  HCT 30.3* 28.9* 31.7* 29.8* 31.0*  MCV 89.6 89.8 91.4 91.4 93.1  PLT 293 256 276 258 329   Basic Metabolic Panel: Recent Labs  Lab 09/16/17 1025 09/17/17 0310 09/18/17 0252 09/19/17 0309 09/20/17 0215  NA 126* 127* 128* 131* 133*  K 4.2 4.2 4.2 3.9 3.9  CL 89* 88* 88* 91* 92*  CO2 24 24 24 29 30   GLUCOSE 165* 107* 106* 126* 161*  BUN 90* 93* 98* 102* 106*  CREATININE 4.46* 4.67* 4.28* 3.90* 3.69*  CALCIUM 7.3* 7.6* 7.8* 7.8* 7.7*  MG 2.1  --   --   --   --   PHOS 6.3*  --  5.9* 5.5* 6.0*   GFR: Estimated Creatinine Clearance: 22.8 mL/min (A) (by C-G formula based on SCr of 3.69 mg/dL (H)).  Liver Function Tests: Recent Labs  Lab 09/16/17 1025 09/17/17 0310 09/18/17 0252 09/18/17 0316 09/19/17 0309 09/20/17 0215  AST 19 18  --  23  --   --   ALT 12* 12*  --  12*  --   --   ALKPHOS 144* 138*  --  160*  --   --   BILITOT 0.9 1.1  --  1.0  --   --   PROT 4.5* 4.9*  --  5.4*  --   --   ALBUMIN 1.4* 1.9* 2.0* 2.0* 1.7* 1.7*    Coagulation Profile: Recent Labs  Lab 09/16/17 1025 09/17/17 0310 09/18/17 0252 09/19/17 0309 09/20/17 0215  INR 1.88 2.62 4.34* 5.78* 3.27    HbA1C: Hgb A1c MFr Bld  Date/Time Value Ref Range Status  09/16/2017 10:25 AM 6.1 (H) 4.8 - 5.6 % Final    Comment:    (NOTE) Pre diabetes:          5.7%-6.4% Diabetes:              >6.4% Glycemic control for   <7.0% adults with diabetes   03/24/2017 04:04 AM 6.7 (H) 4.8 - 5.6 % Final    Comment:    (NOTE)         Pre-diabetes: 5.7 - 6.4         Diabetes: >6.4         Glycemic control for adults with diabetes: <7.0     CBG: Recent Labs  Lab 09/18/17 2124 09/19/17 0926 09/19/17 1240 09/19/17 1741 09/20/17 0811  GLUCAP 140* 120* 109* 144* 104*    Recent Results  (from the past 240 hour(s))  MRSA PCR Screening     Status: Abnormal   Collection Time: 09/16/17 10:38 AM  Result Value Ref Range Status   MRSA by PCR POSITIVE (  A) NEGATIVE Final    Comment:        The GeneXpert MRSA Assay (FDA approved for NASAL specimens only), is one component of a comprehensive MRSA colonization surveillance program. It is not intended to diagnose MRSA infection nor to guide or monitor treatment for MRSA infections. RESULT CALLED TO, READ BACK BY AND VERIFIED WITH: H. Deaton RN 12:20 09/16/17 ([email or web address])   C difficile quick scan w PCR reflex     Status: Abnormal   Collection Time: 09/16/17  1:24 PM  Result Value Ref Range Status   C Diff antigen POSITIVE (A) NEGATIVE Final   C Diff toxin NEGATIVE NEGATIVE Final   C Diff interpretation Results are indeterminate. See PCR results.  Final  Clostridium Difficile by PCR     Status: Abnormal   Collection Time: 09/16/17  1:24 PM  Result Value Ref Range Status   Toxigenic C Difficile by pcr POSITIVE (A) NEGATIVE Final    Comment: Positive for toxigenic C. difficile with little to no toxin production. Only treat if clinical presentation suggests symptomatic illness.     Scheduled Meds: . amiodarone  200 mg Oral BID  . atorvastatin  40 mg Oral QHS  . Chlorhexidine Gluconate Cloth  6 each Topical Q0600  . feeding supplement (PRO-STAT SUGAR FREE 64)  30 mL Oral TID BM  . ferrous sulfate  325 mg Oral Q breakfast  . furosemide  80 mg Intravenous Q12H  . hydrocortisone  25 mg Rectal BID  . insulin aspart  0-9 Units Subcutaneous TID WC  . mouth rinse  15 mL Mouth Rinse BID  . mupirocin ointment  1 application Nasal BID  . pantoprazole  20 mg Oral Daily  . sodium chloride flush  3 mL Intravenous Q12H  . traZODone  50 mg Oral QHS  . vancomycin  500 mg Oral Q6H  . vitamin B-12  1,000 mcg Oral Daily     LOS: 4 days   [name], MD [facility] Office  [phone] Pager - Text Page per Amion as per  below:  On-Call/Text Page:      [email or web address]      password [email or web address]  If 7PM-7AM, please contact night-coverage [email or web address] Password [email or web address] 09/20/2017, 10:19 AM

## 2017-09-20 NOTE — Progress Notes (Signed)
Noticed that patient has moderate amount of blood in his stool. Notified Dr Hal Hope. Awaiting call back. Will continue to monitor.

## 2017-09-20 NOTE — Progress Notes (Signed)
Patient ID: Christian Garner, male   DOB: November 30, 1945, 71 y.o.   MRN: [id number] S:No new complaints today but has melena in rectal tube O:BP (!) 105/43 (BP Location: Left Arm)   Pulse 63   Temp 98.6 F (37 C) (Bladder)   Resp 17   Ht 6' (1.829 m) Comment: estimate d/t pt unable to answer  Wt 106.5 kg (234 lb 12.6 oz)   SpO2 100%   BMI 31.84 kg/m   Intake/Output Summary (Last 24 hours) at 09/20/2017 1105 Last data filed at 09/20/2017 0359 Gross per 24 hour  Intake -  Output 1200 ml  Net -1200 ml   Intake/Output: I/O last 3 completed shifts: In: 31 [IV Piggyback:66] Out: 2250 [UYQIH:4742; Stool:300]  Intake/Output this shift:  No intake/output data recorded. Weight change: -1.9 kg (-3 oz) Gen: chronically ill-appearing WM in NAD CVS: no rub Resp: cta Abd:+BS Ext: trace edema  Recent Labs  Lab 09/16/17 1025 09/17/17 0310 09/18/17 0252 09/18/17 0316 09/19/17 0309 09/20/17 0215  NA 126* 127* 128*  --  131* 133*  K 4.2 4.2 4.2  --  3.9 3.9  CL 89* 88* 88*  --  91* 92*  CO2 24 24 24   --  29 30  GLUCOSE 165* 107* 106*  --  126* 161*  BUN 90* 93* 98*  --  102* 106*  CREATININE 4.46* 4.67* 4.28*  --  3.90* 3.69*  ALBUMIN 1.4* 1.9* 2.0* 2.0* 1.7* 1.7*  CALCIUM 7.3* 7.6* 7.8*  --  7.8* 7.7*  PHOS 6.3*  --  5.9*  --  5.5* 6.0*  AST 19 18  --  23  --   --   ALT 12* 12*  --  12*  --   --    Liver Function Tests: Recent Labs  Lab 09/16/17 1025 09/17/17 0310  09/18/17 0316 09/19/17 0309 09/20/17 0215  AST 19 18  --  23  --   --   ALT 12* 12*  --  12*  --   --   ALKPHOS 144* 138*  --  160*  --   --   BILITOT 0.9 1.1  --  1.0  --   --   PROT 4.5* 4.9*  --  5.4*  --   --   ALBUMIN 1.4* 1.9*   < > 2.0* 1.7* 1.7*   < > = values in this interval not displayed.   No results for input(s): LIPASE, AMYLASE in the last 168 hours. No results for input(s): AMMONIA in the last 168 hours. CBC: Recent Labs  Lab 09/16/17 1025 09/17/17 0310 09/18/17 0252 09/19/17 0309  09/20/17 0215  WBC 29.6* 22.3* 20.9* 16.2* 15.1*  NEUTROABS 27.5*  --   --   --   --   HGB 10.2* 9.4* 10.2* 9.5* 9.7*  HCT 30.3* 28.9* 31.7* 29.8* 31.0*  MCV 89.6 89.8 91.4 91.4 93.1  PLT 293 256 276 258 240   Cardiac Enzymes: No results for input(s): CKTOTAL, CKMB, CKMBINDEX, TROPONINI in the last 168 hours. CBG: Recent Labs  Lab 09/18/17 2124 09/19/17 0926 09/19/17 1240 09/19/17 1741 09/20/17 0811  GLUCAP 140* 120* 109* 144* 104*    Iron Studies: No results for input(s): IRON, TIBC, TRANSFERRIN, FERRITIN in the last 72 hours. Studies/Results: No results found. Marland Kitchen amiodarone  200 mg Oral BID  . atorvastatin  40 mg Oral QHS  . Chlorhexidine Gluconate Cloth  6 each Topical Q0600  . feeding supplement (PRO-STAT SUGAR FREE 64)  30 mL Oral  TID BM  . ferrous sulfate  325 mg Oral Q breakfast  . furosemide  80 mg Intravenous Q12H  . hydrocortisone  25 mg Rectal BID  . insulin aspart  0-9 Units Subcutaneous TID WC  . mouth rinse  15 mL Mouth Rinse BID  . mupirocin ointment  1 application Nasal BID  . pantoprazole  20 mg Oral Daily  . sodium chloride flush  3 mL Intravenous Q12H  . traZODone  50 mg Oral QHS  . vancomycin  500 mg Oral Q6H  . vitamin B-12  1,000 mcg Oral Daily    BMET    Component Value Date/Time   NA 133 (L) 09/20/2017 0215   K 3.9 09/20/2017 0215   CL 92 (L) 09/20/2017 0215   CO2 30 09/20/2017 0215   GLUCOSE 161 (H) 09/20/2017 0215   BUN 106 (H) 09/20/2017 0215   CREATININE 3.69 (H) 09/20/2017 0215   CALCIUM 7.7 (L) 09/20/2017 0215   GFRNONAA 15 (L) 09/20/2017 0215   GFRAA 17 (L) 09/20/2017 0215   CBC    Component Value Date/Time   WBC 15.1 (H) 09/20/2017 0215   RBC 3.33 (L) 09/20/2017 0215   HGB 9.7 (L) 09/20/2017 0215   HCT 31.0 (L) 09/20/2017 0215   PLT 240 09/20/2017 0215   MCV 93.1 09/20/2017 0215   MCH 29.1 09/20/2017 0215   MCHC 31.3 09/20/2017 0215   RDW 17.4 (H) 09/20/2017 0215   LYMPHSABS 1.2 09/16/2017 1025   MONOABS 0.9  09/16/2017 1025   EOSABS 0.0 09/16/2017 1025   BASOSABS 0.0 09/16/2017 1025     Assessment/Plan:  1. AKI/CKD stage 3 presumably due to ischemic ATN in setting of sepsis and volume depletion further complicated by decompensated CHF.  Scr has been improving for the last 3 days.  Continue with current treatment plan 2. Hyponatremia- improving with improved renal function and UOP 3. C diff colitis/sepsis- per primary 4. Anemia- with GIB due to #3 transfuse prn 5. P. Afib 6. Moderate protein and caloric malnutrition 7. Stage II sacral decubitus ulcer- [place], MD [facility] [phone]

## 2017-09-21 ENCOUNTER — Other Ambulatory Visit: Payer: Self-pay

## 2017-09-21 LAB — RENAL FUNCTION PANEL
ANION GAP: 10 (ref 5–15)
Albumin: 1.7 g/dL — ABNORMAL LOW (ref 3.5–5.0)
BUN: 110 mg/dL — ABNORMAL HIGH (ref 6–20)
CHLORIDE: 92 mmol/L — AB (ref 101–111)
CO2: 30 mmol/L (ref 22–32)
Calcium: 7.8 mg/dL — ABNORMAL LOW (ref 8.9–10.3)
Creatinine, Ser: 3.55 mg/dL — ABNORMAL HIGH (ref 0.61–1.24)
GFR calc Af Amer: 18 mL/min — ABNORMAL LOW (ref >60)
GFR calc non Af Amer: 16 mL/min — ABNORMAL LOW (ref >60)
GLUCOSE: 119 mg/dL — AB (ref 65–99)
PHOSPHORUS: 5.4 mg/dL — AB (ref 2.5–4.6)
POTASSIUM: 3.9 mmol/L (ref 3.5–5.1)
Sodium: 132 mmol/L — ABNORMAL LOW (ref 135–145)

## 2017-09-21 LAB — CBC
HCT: 29.6 % — ABNORMAL LOW (ref 39.0–52.0)
Hemoglobin: 9.3 g/dL — ABNORMAL LOW (ref 13.0–17.0)
MCH: 29.2 pg (ref 26.0–34.0)
MCHC: 31.4 g/dL (ref 30.0–36.0)
MCV: 93.1 fL (ref 78.0–100.0)
PLATELETS: 241 10*3/uL (ref 150–400)
RBC: 3.18 MIL/uL — ABNORMAL LOW (ref 4.22–5.81)
RDW: 17.3 % — ABNORMAL HIGH (ref 11.5–15.5)
WBC: 15.7 10*3/uL — ABNORMAL HIGH (ref 4.0–10.5)

## 2017-09-21 LAB — GLUCOSE, CAPILLARY
GLUCOSE-CAPILLARY: 123 mg/dL — AB (ref 65–99)
GLUCOSE-CAPILLARY: 133 mg/dL — AB (ref 65–99)
Glucose-Capillary: 109 mg/dL — ABNORMAL HIGH (ref 65–99)
Glucose-Capillary: 127 mg/dL — ABNORMAL HIGH (ref 65–99)

## 2017-09-21 LAB — PROTIME-INR
INR: 3.34
PROTHROMBIN TIME: 33.6 s — AB (ref 11.4–15.2)

## 2017-09-21 MED ORDER — FUROSEMIDE 10 MG/ML IJ SOLN
80.0000 mg | Freq: Two times a day (BID) | INTRAMUSCULAR | Status: DC
  Administered 2017-09-22 – 2017-09-24 (×5): 80 mg via INTRAVENOUS
  Filled 2017-09-21 (×6): qty 8

## 2017-09-21 MED ORDER — DEXTROSE 5 % IV SOLN
5.0000 mg | Freq: Once | INTRAVENOUS | Status: AC
  Administered 2017-09-21: 5 mg via INTRAVENOUS
  Filled 2017-09-21 (×2): qty 0.5

## 2017-09-21 NOTE — Progress Notes (Signed)
[place] TEAM 1 - Stepdown/ICU TEAM  Christian Garner  HEN:[id number] DOB: 15-Jul-1946 DOA: 09/16/2017 PCP: [name], MD    Brief Narrative:  71 y.o. male with history of systolic CHF with an EF of 30% on home oxygen, CAD s/p PTCA greater than 1 year ago, atrial fibrillation chronically anticoagulated with warfarin, CKD with a baseline creatinine of 1.3 (hemodialysis during admission in January 2018), and DM2 with chronic diabetic foot ulcer who presented in transfer from [facility] for evaluation of acute on chronic renal failure.  Patient presented to [name] on December 3 acutely ill with evidence of sepsis.  He was found to have C. difficile colitis.  He was in acute renal failure upon presentation and his renal function failed to improve.  Consultation was sought with Dr. Justin Mend per telephone and recommendation was made for transfer to [facility].  On initial presentation his INR was markedly elevated at 5.8 and increased to 8.4 on the day after admission.  At that point he received treatment w/ vitamin K.  On the day prior to transfer INR was down to 2.5.     Significant Events: 12/3 admit to [facility] w/ sepsis due to C. Diff 12/6 transfer to [facility]  12/7 flexiseal inserted (29 day limit) 12/9 dosed w/ vitamin K 12/10 developed evidence of maroon stools - flexiseal removed   Subjective: Alert and conversant, though mildly confused.  Denies cp, sob, n/v or abdom pain, but reports that he "feels like crap in general."  Assessment & Plan:  Acute kidney injury superimposed on chronic kidney disease stage III Nephrology is attending to this issue - felt to be ATN due to sepsis - crt slowly improving   Recent Labs  Lab 09/17/17 0310 09/18/17 0252 09/19/17 0309 09/20/17 0215 09/21/17 0226  CREATININE 4.67* 4.28* 3.90* 3.69* 3.55*      Acute on chronic combined systolic and diastolic congestive heart failure EF 30-35% - follow Is/Os and daily weights -  attempts to diurese ongoing - net negative ~5.4L thus far - 375cc UOP last 24hrs - no gross volume overload on exam presently   Filed Weights   09/19/17 0526 09/20/17 0400 09/21/17 0500  Weight: 108.4 kg (238 lb 15.7 oz) 106.5 kg (234 lb 12.6 oz) 102.3 kg (225 lb 8.5 oz)    Mild acute delirium Most likely uremia w/ BUN still climbing - hold diuretic today - w/ crt improving anticipate BUN should follow suit soon  Chronic Paroxysmal atrial fibrillation w/ acute RVR on warfarin chronically Warfarin per Pharmacy - HR controlled   Warfarin induced coagulopathy INR improved after 2.5mg  Vit K dose, but now trending back up - w/ development of GIB will dose w/ 5mg  Vitamin K today and allow to fall below therapeutic goal until GIB has stopped   LGIB RN reports that bloody stools are maroon, and not ture melena - suspect this is due to weaping related to flexiseal as well as C diff - reverse INR - follow Hgb  Recent Labs  Lab 09/17/17 0310 09/18/17 0252 09/19/17 0309 09/20/17 0215 09/21/17 0223  HGB 9.4* 10.2* 9.5* 9.7* 9.3*    Hyponatremia Due to acute kidney injury, CHF, and free water excretion defect - follow trend - presently within acceptable range   C. difficile colitis Cont vancomycin   DM2 with left diabetic foot ulcer CBG controlled   Multiple cutaneous injuries  right lower extremity venous stasis ulcer, left foot diabetic ulcer, and a stage II sacral injury with  deep tissue damage - WOC has been consulted   Coronary artery disease No evidence of acute coronary syndrome   Anemia of chronic kidney disease + acute illness Hgb has fallen slightly in setting of GIB - follow trend   MRSA screen +  DVT prophylaxis: warfarin + SCDs  Code Status: FULL CODE Family Communication: no family present at time of exam  Disposition Plan: SDU - will need SNF   Consultants:  Nephrology   Antimicrobials:  Vancomycin 12/6 >  Objective: Blood pressure (!) 103/43, pulse  (!) 56, temperature 99 F (37.2 C), temperature source Bladder, resp. rate 15, height 6' (1.829 m), weight 102.3 kg (225 lb 8.5 oz), SpO2 95 %.  Intake/Output Summary (Last 24 hours) at 09/21/2017 0913 Last data filed at 09/20/2017 1800 Gross per 24 hour  Intake 120 ml  Output 625 ml  Net -505 ml   Filed Weights   09/19/17 0526 09/20/17 0400 09/21/17 0500  Weight: 108.4 kg (238 lb 15.7 oz) 106.5 kg (234 lb 12.6 oz) 102.3 kg (225 lb 8.5 oz)    Examination: General: No acute respiratory distress - alert and conversant  Lungs: CTA B - no wheezing or crackles  Cardiovascular: RRR - no M or rub  Abdomen: NT/ND, soft, bowel sounds positive, no mass or rebound  Extremities: no signif LE edema   CBC: Recent Labs  Lab 09/16/17 1025 09/17/17 0310 09/18/17 0252 09/19/17 0309 09/20/17 0215 09/21/17 0223  WBC 29.6* 22.3* 20.9* 16.2* 15.1* 15.7*  NEUTROABS 27.5*  --   --   --   --   --   HGB 10.2* 9.4* 10.2* 9.5* 9.7* 9.3*  HCT 30.3* 28.9* 31.7* 29.8* 31.0* 29.6*  MCV 89.6 89.8 91.4 91.4 93.1 93.1  PLT 293 256 276 258 240 532   Basic Metabolic Panel: Recent Labs  Lab 09/16/17 1025 09/17/17 0310 09/18/17 0252 09/19/17 0309 09/20/17 0215 09/21/17 0226  NA 126* 127* 128* 131* 133* 132*  K 4.2 4.2 4.2 3.9 3.9 3.9  CL 89* 88* 88* 91* 92* 92*  CO2 24 24 24 29 30 30   GLUCOSE 165* 107* 106* 126* 161* 119*  BUN 90* 93* 98* 102* 106* 110*  CREATININE 4.46* 4.67* 4.28* 3.90* 3.69* 3.55*  CALCIUM 7.3* 7.6* 7.8* 7.8* 7.7* 7.8*  MG 2.1  --   --   --   --   --   PHOS 6.3*  --  5.9* 5.5* 6.0* 5.4*   GFR: Estimated Creatinine Clearance: 23.3 mL/min (A) (by C-G formula based on SCr of 3.55 mg/dL (H)).  Liver Function Tests: Recent Labs  Lab 09/16/17 1025 09/17/17 0310 09/18/17 0252 09/18/17 0316 09/19/17 0309 09/20/17 0215 09/21/17 0226  AST 19 18  --  23  --   --   --   ALT 12* 12*  --  12*  --   --   --   ALKPHOS 144* 138*  --  160*  --   --   --   BILITOT 0.9 1.1  --  1.0   --   --   --   PROT 4.5* 4.9*  --  5.4*  --   --   --   ALBUMIN 1.4* 1.9* 2.0* 2.0* 1.7* 1.7* 1.7*    Coagulation Profile: Recent Labs  Lab 09/17/17 0310 09/18/17 0252 09/19/17 0309 09/20/17 0215 09/21/17 0223  INR 2.62 4.34* 5.78* 3.27 3.34    HbA1C: Hgb A1c MFr Bld  Date/Time Value Ref Range Status  09/16/2017 10:25 AM  6.1 (H) 4.8 - 5.6 % Final    Comment:    (NOTE) Pre diabetes:          5.7%-6.4% Diabetes:              >6.4% Glycemic control for   <7.0% adults with diabetes   03/24/2017 04:04 AM 6.7 (H) 4.8 - 5.6 % Final    Comment:    (NOTE)         Pre-diabetes: 5.7 - 6.4         Diabetes: >6.4         Glycemic control for adults with diabetes: <7.0     CBG: Recent Labs  Lab 09/20/17 0811 09/20/17 1230 09/20/17 1701 09/20/17 2016 09/21/17 0812  GLUCAP 104* 153* 146* 123* 109*    Recent Results (from the past 240 hour(s))  MRSA PCR Screening     Status: Abnormal   Collection Time: 09/16/17 10:38 AM  Result Value Ref Range Status   MRSA by PCR POSITIVE (A) NEGATIVE Final    Comment:        The GeneXpert MRSA Assay (FDA approved for NASAL specimens only), is one component of a comprehensive MRSA colonization surveillance program. It is not intended to diagnose MRSA infection nor to guide or monitor treatment for MRSA infections. RESULT CALLED TO, READ BACK BY AND VERIFIED WITH: H. Deaton RN 12:20 09/16/17 ([email or web address])   C difficile quick scan w PCR reflex     Status: Abnormal   Collection Time: 09/16/17  1:24 PM  Result Value Ref Range Status   C Diff antigen POSITIVE (A) NEGATIVE Final   C Diff toxin NEGATIVE NEGATIVE Final   C Diff interpretation Results are indeterminate. See PCR results.  Final  Clostridium Difficile by PCR     Status: Abnormal   Collection Time: 09/16/17  1:24 PM  Result Value Ref Range Status   Toxigenic C Difficile by pcr POSITIVE (A) NEGATIVE Final    Comment: Positive for toxigenic C. difficile with little to no  toxin production. Only treat if clinical presentation suggests symptomatic illness.     Scheduled Meds: . amiodarone  200 mg Oral BID  . atorvastatin  40 mg Oral QHS  . feeding supplement (PRO-STAT SUGAR FREE 64)  30 mL Oral TID BM  . ferrous sulfate  325 mg Oral Q breakfast  . furosemide  80 mg Intravenous Q12H  . hydrocortisone  25 mg Rectal BID  . insulin aspart  0-9 Units Subcutaneous TID WC  . mouth rinse  15 mL Mouth Rinse BID  . mupirocin ointment  1 application Nasal BID  . pantoprazole  20 mg Oral Daily  . sodium chloride flush  3 mL Intravenous Q12H  . traZODone  50 mg Oral QHS  . vancomycin  500 mg Oral Q6H  . vitamin B-12  1,000 mcg Oral Daily     LOS: 5 days   [name], MD [facility] Office  [phone] Pager - Text Page per Amion as per below:  On-Call/Text Page:      [email or web address]      password [email or web address]  If 7PM-7AM, please contact night-coverage [email or web address] Password [facility] 09/21/2017, 9:13 AM

## 2017-09-21 NOTE — Progress Notes (Signed)
Physical Therapy Treatment Patient Details Name: Christian Garner MRN: [id number] DOB: Apr 15, 1946 Today's Date: 09/21/2017    History of Present Illness 71 y.o. male with history of systolic CHF with an EF of 30% on home oxygen, CAD s/p PTCA greater than 1 year ago, a-fib chronically anticoagulated with warfarin, CKD with a baseline creatinine of 1.3 (hemodialysis during admission in January 2018), and DM2 with chronic diabetic foot ulcer who presented in transfer from [facility] on 09/16/17 for evaluation of acute on chronic renal failure. Pt found to have sepsis due to c diff.   PT Comments    Pt requiring less assist for bed mobility, but remains reliant on modA to stand with RW. Able to sit EOB >15 minutes with min guard working on improved trunk control. Pt incontinent with bloody bowel movement this session (RN aware) and dependent for pericare; requesting to return to supine after standing for pericare despite max encouragement for transfer to chair. Feel that pt would be able to perform lateral scoot from bed to drop-arm chair with minA; RN notified. Still recommend SNF-level therapies at d/c.   Resting BP 112/49 Post-mobility BP 131/52   Follow Up Recommendations  SNF     Equipment Recommendations  None recommended by PT    Recommendations for Other Services       Precautions / Restrictions Precautions Precautions: Fall Restrictions Weight Bearing Restrictions: No    Mobility  Bed Mobility Overal bed mobility: Needs Assistance Bed Mobility: Supine to Sit;Sit to Supine     Supine to sit: HOB elevated;Min assist Sit to supine: Mod assist;+2 for physical assistance   General bed mobility comments: MinA for single UE support to pull trunk into sitting, minA to scoot R hip to EOB; increased time, but pt able to assist much more this session. ModA+2 to return to supine as pt with increasing fatigue and pallor.   Transfers Overall transfer level: Needs  assistance Equipment used: Rolling walker (2 wheeled) Transfers: Sit to/from Stand Sit to Stand: Mod assist;+2 physical assistance         General transfer comment: ModA+2 to elevate trunk and cues bilat hip extension. Dependent for pericare as pt incontient with bloody bowel movement (RN aware) during bed mobility. Max encouragement and cues to remain standing for pericare. Pt eventually relying on BLE support on EOB and modA for trunk elevation. Upon sitting, stated "I have to lie down" despite max encouragement for transfer to chair  Ambulation/Gait             General Gait Details: Unable   Stairs            Wheelchair Mobility    Modified Rankin (Stroke Patients Only)       Balance Overall balance assessment: Needs assistance Sitting-balance support: Feet supported Sitting balance-Leahy Scale: Fair Sitting balance - Comments: Able to sit EOB >15 min with supervision, eventually min guard due to fatigue   Standing balance support: Bilateral upper extremity supported Standing balance-Leahy Scale: Poor Standing balance comment: Reliant on BUE support and modA for static standing; dependent on pericare                            Cognition Arousal/Alertness: Awake/alert Behavior During Therapy: WFL for tasks assessed/performed Overall Cognitive Status: No family/caregiver present to determine baseline cognitive functioning Area of Impairment: Orientation                 Orientation Level: Disoriented  to;Time     Following Commands: Follows one step commands with increased time;Follows one step commands consistently;Follows multi-step commands inconsistently Safety/Judgement: Decreased awareness of deficits     General Comments: Initially stating it today was September; able to get December 2018 with verbal cues. Increased time to follow one step commands with intermittent cues for mobility. With increasing fatigue continues to state "yeah"  but not in response to anything      Exercises      General Comments General comments (skin integrity, edema, etc.): SpO2 down to 83% on RA; remained >92% on 2L O2 Alpha.       Pertinent Vitals/Pain Pain Assessment: Faces Faces Pain Scale: Hurts little more Pain Location: Sacrum/buttocks Pain Descriptors / Indicators: Sore Pain Intervention(s): Monitored during session;Repositioned    Home Living                      Prior Function            PT Goals (current goals can now be found in the care plan section) Acute Rehab PT Goals Patient Stated Goal: Decreased pain PT Goal Formulation: With patient Time For Goal Achievement: 10/02/17 Potential to Achieve Goals: Good Progress towards PT goals: Progressing toward goals    Frequency    Min 2X/week      PT Plan Current plan remains appropriate    Co-evaluation              AM-PAC PT "6 Clicks" Daily Activity  Outcome Measure  Difficulty turning over in bed (including adjusting bedclothes, sheets and blankets)?: Unable Difficulty moving from lying on back to sitting on the side of the bed? : Unable Difficulty sitting down on and standing up from a chair with arms (e.g., wheelchair, bedside commode, etc,.)?: Unable Help needed moving to and from a bed to chair (including a wheelchair)?: A Lot Help needed walking in hospital room?: Total Help needed climbing 3-5 steps with a railing? : Total 6 Click Score: 7    End of Session Equipment Utilized During Treatment: Gait belt;Oxygen Activity Tolerance: Patient tolerated treatment well;Patient limited by fatigue Patient left: in bed;with call bell/phone within reach;with bed alarm set Nurse Communication: Mobility status PT Visit Diagnosis: Unsteadiness on feet (R26.81);Other abnormalities of gait and mobility (R26.89);Muscle weakness (generalized) (M62.81)     Time: 9417-4081 PT Time Calculation (min) (ACUTE ONLY): 38 min  Charges:  $Therapeutic  Activity: 38-52 mins                    G Codes:      Mabeline Caras, PT, DPT Acute Rehab Services  Pager: [place] 09/21/2017, 3:27 PM

## 2017-09-21 NOTE — Progress Notes (Signed)
Patient ID: Christian Garner, male   DOB: Feb 15, 1946, 71 y.o.   MRN: [id number] S:No new complaints O:BP (!) 103/43 (BP Location: Left Arm)   Pulse (!) 56   Temp 99 F (37.2 C) (Bladder)   Resp 15   Ht 6' (1.829 m) Comment: estimate d/t pt unable to answer  Wt 102.3 kg (225 lb 8.5 oz)   SpO2 95%   BMI 30.59 kg/m   Intake/Output Summary (Last 24 hours) at 09/21/2017 1047 Last data filed at 09/21/2017 0830 Gross per 24 hour  Intake 120 ml  Output 775 ml  Net -655 ml   Intake/Output: I/O last 3 completed shifts: In: 120 [P.O.:120] Out: 1375 [Urine:1125; Stool:250]  Intake/Output this shift:  Total I/O In: -  Out: 150 [Urine:150] Weight change: -4.2 kg (-4.2 oz) Gen: chronically ill-appearing WM in NAD CVS:no rub Resp:cta QZR:[id number] Ext: trace pedal edema  Recent Labs  Lab 09/16/17 1025 09/17/17 0310 09/18/17 0252 09/18/17 0316 09/19/17 0309 09/20/17 0215 09/21/17 0226  NA 126* 127* 128*  --  131* 133* 132*  K 4.2 4.2 4.2  --  3.9 3.9 3.9  CL 89* 88* 88*  --  91* 92* 92*  CO2 24 24 24   --  29 30 30   GLUCOSE 165* 107* 106*  --  126* 161* 119*  BUN 90* 93* 98*  --  102* 106* 110*  CREATININE 4.46* 4.67* 4.28*  --  3.90* 3.69* 3.55*  ALBUMIN 1.4* 1.9* 2.0* 2.0* 1.7* 1.7* 1.7*  CALCIUM 7.3* 7.6* 7.8*  --  7.8* 7.7* 7.8*  PHOS 6.3*  --  5.9*  --  5.5* 6.0* 5.4*  AST 19 18  --  23  --   --   --   ALT 12* 12*  --  12*  --   --   --    Liver Function Tests: Recent Labs  Lab 09/16/17 1025 09/17/17 0310  09/18/17 0316 09/19/17 0309 09/20/17 0215 09/21/17 0226  AST 19 18  --  23  --   --   --   ALT 12* 12*  --  12*  --   --   --   ALKPHOS 144* 138*  --  160*  --   --   --   BILITOT 0.9 1.1  --  1.0  --   --   --   PROT 4.5* 4.9*  --  5.4*  --   --   --   ALBUMIN 1.4* 1.9*   < > 2.0* 1.7* 1.7* 1.7*   < > = values in this interval not displayed.   No results for input(s): LIPASE, AMYLASE in the last 168 hours. No results for input(s): AMMONIA in the last 168  hours. CBC: Recent Labs  Lab 09/16/17 1025 09/17/17 0310 09/18/17 0252 09/19/17 0309 09/20/17 0215 09/21/17 0223  WBC 29.6* 22.3* 20.9* 16.2* 15.1* 15.7*  NEUTROABS 27.5*  --   --   --   --   --   HGB 10.2* 9.4* 10.2* 9.5* 9.7* 9.3*  HCT 30.3* 28.9* 31.7* 29.8* 31.0* 29.6*  MCV 89.6 89.8 91.4 91.4 93.1 93.1  PLT 293 256 276 258 240 241   Cardiac Enzymes: No results for input(s): CKTOTAL, CKMB, CKMBINDEX, TROPONINI in the last 168 hours. CBG: Recent Labs  Lab 09/20/17 0811 09/20/17 1230 09/20/17 1701 09/20/17 2016 09/21/17 0812  GLUCAP 104* 153* 146* 123* 109*    Iron Studies: No results for input(s): IRON, TIBC, TRANSFERRIN, FERRITIN in the last  72 hours. Studies/Results: No results found. Marland Kitchen amiodarone  200 mg Oral BID  . atorvastatin  40 mg Oral QHS  . feeding supplement (PRO-STAT SUGAR FREE 64)  30 mL Oral TID BM  . ferrous sulfate  325 mg Oral Q breakfast  . [START ON 09/22/2017] furosemide  80 mg Intravenous Q12H  . hydrocortisone  25 mg Rectal BID  . insulin aspart  0-9 Units Subcutaneous TID WC  . mouth rinse  15 mL Mouth Rinse BID  . mupirocin ointment  1 application Nasal BID  . pantoprazole  20 mg Oral Daily  . sodium chloride flush  3 mL Intravenous Q12H  . traZODone  50 mg Oral QHS  . vancomycin  500 mg Oral Q6H  . vitamin B-12  1,000 mcg Oral Daily    BMET    Component Value Date/Time   NA 132 (L) 09/21/2017 0226   K 3.9 09/21/2017 0226   CL 92 (L) 09/21/2017 0226   CO2 30 09/21/2017 0226   GLUCOSE 119 (H) 09/21/2017 0226   BUN 110 (H) 09/21/2017 0226   CREATININE 3.55 (H) 09/21/2017 0226   CALCIUM 7.8 (L) 09/21/2017 0226   GFRNONAA 16 (L) 09/21/2017 0226   GFRAA 18 (L) 09/21/2017 0226   CBC    Component Value Date/Time   WBC 15.7 (H) 09/21/2017 0223   RBC 3.18 (L) 09/21/2017 0223   HGB 9.3 (L) 09/21/2017 0223   HCT 29.6 (L) 09/21/2017 0223   PLT 241 09/21/2017 0223   MCV 93.1 09/21/2017 0223   MCH 29.2 09/21/2017 0223   MCHC 31.4  09/21/2017 0223   RDW 17.3 (H) 09/21/2017 0223   LYMPHSABS 1.2 09/16/2017 1025   MONOABS 0.9 09/16/2017 1025   EOSABS 0.0 09/16/2017 1025   BASOSABS 0.0 09/16/2017 1025     Assessment/Plan:  1. AKI/CKD stage 3 presumably due to ischemic ATN in setting of sepsis and volume depletion further complicated by decompensated CHF.  Scr has been improving for the last 4 days.  Continue with current treatment plan.  Nothing further to add.  Will sign off.  Please call with questions or concerns.  Can follow up with our office as an outpatient if renal function does not return to baseline. 2. Hyponatremia- improving with improved renal function and UOP 3. C diff colitis/sepsis- per primary 4. Anemia- with GIB due to #3 transfuse prn 5. P. Afib 6. Moderate protein and caloric malnutrition 7. Stage II sacral decubitus ulcer- [place], MD [facility] [phone]

## 2017-09-22 DIAGNOSIS — E118 Type 2 diabetes mellitus with unspecified complications: Secondary | ICD-10-CM

## 2017-09-22 DIAGNOSIS — K922 Gastrointestinal hemorrhage, unspecified: Secondary | ICD-10-CM

## 2017-09-22 LAB — CBC
HEMATOCRIT: 29.3 % — AB (ref 39.0–52.0)
HEMOGLOBIN: 9.2 g/dL — AB (ref 13.0–17.0)
MCH: 29.4 pg (ref 26.0–34.0)
MCHC: 31.4 g/dL (ref 30.0–36.0)
MCV: 93.6 fL (ref 78.0–100.0)
Platelets: 246 10*3/uL (ref 150–400)
RBC: 3.13 MIL/uL — ABNORMAL LOW (ref 4.22–5.81)
RDW: 17.3 % — AB (ref 11.5–15.5)
WBC: 19.9 10*3/uL — ABNORMAL HIGH (ref 4.0–10.5)

## 2017-09-22 LAB — RENAL FUNCTION PANEL
ANION GAP: 10 (ref 5–15)
Albumin: 1.8 g/dL — ABNORMAL LOW (ref 3.5–5.0)
BUN: 113 mg/dL — AB (ref 6–20)
CO2: 29 mmol/L (ref 22–32)
Calcium: 7.6 mg/dL — ABNORMAL LOW (ref 8.9–10.3)
Chloride: 93 mmol/L — ABNORMAL LOW (ref 101–111)
Creatinine, Ser: 3.53 mg/dL — ABNORMAL HIGH (ref 0.61–1.24)
GFR calc Af Amer: 18 mL/min — ABNORMAL LOW (ref >60)
GFR, EST NON AFRICAN AMERICAN: 16 mL/min — AB (ref >60)
GLUCOSE: 118 mg/dL — AB (ref 65–99)
PHOSPHORUS: 5.2 mg/dL — AB (ref 2.5–4.6)
POTASSIUM: 4 mmol/L (ref 3.5–5.1)
Sodium: 132 mmol/L — ABNORMAL LOW (ref 135–145)

## 2017-09-22 LAB — PROTIME-INR
INR: 1.44
Prothrombin Time: 17.4 seconds — ABNORMAL HIGH (ref 11.4–15.2)

## 2017-09-22 LAB — GLUCOSE, CAPILLARY
Glucose-Capillary: 107 mg/dL — ABNORMAL HIGH (ref 65–99)
Glucose-Capillary: 139 mg/dL — ABNORMAL HIGH (ref 65–99)

## 2017-09-22 MED ORDER — CHLORPROMAZINE HCL 25 MG/ML IJ SOLN
25.0000 mg | Freq: Two times a day (BID) | INTRAMUSCULAR | Status: DC | PRN
  Administered 2017-09-22 – 2017-09-24 (×2): 25 mg via INTRAMUSCULAR
  Filled 2017-09-22 (×4): qty 1

## 2017-09-22 MED ORDER — NEPRO/CARBSTEADY PO LIQD
237.0000 mL | Freq: Three times a day (TID) | ORAL | Status: DC
  Administered 2017-09-22 – 2017-09-28 (×14): 237 mL via ORAL
  Filled 2017-09-22 (×23): qty 237

## 2017-09-22 NOTE — Progress Notes (Addendum)
Nutrition Follow-up  DOCUMENTATION CODES:   Obesity unspecified  INTERVENTION:   -Continue 30 ml Prostat TID, each supplement provides 100 kcals and 15 grams protein  -Nepro Shake po TID, each supplement provides 425 kcal and 19 grams protein  -If pt remains with prolonged poor po intake, may need to consider temporary means of enteral nutrition support via cortrak tube to help pt meet needs. Recommend:  Initiate Nepro @ 10 ml/hr and increase by 10 ml every 4 hours to goal rate of 40 ml/hr.   60 ml Prostat BID.    Tube feeding regimen provides 2128 kcal (97% of needs), 138 grams of protein, and 698 ml of H2O.   NUTRITION DIAGNOSIS:   Increased nutrient needs related to wound healing as evidenced by estimated needs.  Ongoing  GOAL:   Patient will meet greater than or equal to 90% of their needs  Unmet  MONITOR:   PO intake, Supplement acceptance, Labs, Weight trends, Skin, I & O's  REASON FOR ASSESSMENT:   Consult (renal)  ASSESSMENT:   Christian Garner is a 71 y.o. male with medical history significant of congestive heart failure with an EF of 30% on home oxygen, coronary artery disease status post percutaneous coronary intervention greater than 1 year ago per patient records, atrial fibrillation chronically anticoagulated with warfarin, chronic kidney disease with a baseline creatinine of 1.3 did require hemodialysis during admission in January 2018, diabetes type 2 with chronic diabetic foot ulcer and renal disease who presents in transfer from [facility] for evaluation of acute on chronic renal failure.  12/7- rectal tube inserted 12/10- rectal tube removed  Per nephrology note, pt with AKI/CKD stage 3 presumably due to ischemic ATN in setting of sepsis and volume depletion further complicated by decompensated CHF.  Pt sleeping soundly at time of visit. He did not arouse to name being called.   Intake has been very poor; PO 0% (pt has been refusing meals  since 09/18/17). Pt has taken the last 3 doses of Prostat. Pt may need alternate means of nutrition support if intake continues to be poor, especially in the setting of multiple wounds.   Labs reviewed: Na: 132, Phos: 5.2, CBGS: 107-133 (inpatient orders for glycemic control are 0-9 units insulin aspart TID with meals.   Diet Order:  Diet renal with fluid restriction Fluid restriction: 1200 mL Fluid; Room service appropriate? Yes; Fluid consistency: Thin  EDUCATION NEEDS:   Not appropriate for education at this time  Skin:  Skin Assessment: Skin Integrity Issues: Skin Integrity Issues:: Stage II, Other (Comment), Stage III Stage II: inner gluteal fold Stage III: lt lateral foot Diabetic Ulcer: n/a Other: rt leg venous stasis ulcer  Last BM:  09/22/17  Height:   Ht Readings from Last 1 Encounters:  09/16/17 6' (1.829 m)    Weight:   Wt Readings from Last 1 Encounters:  09/22/17 224 lb 13.9 oz (102 kg)    Ideal Body Weight:  80.9 kg  BMI:  Body mass index is 30.5 kg/m.  Estimated Nutritional Needs:   Kcal:  2200-2400  Protein:  125-140 grams  Fluid:  1-1.5 L    Christian Garner, RD, LDN, CDE Pager: [phone] After hours Pager: [phone]

## 2017-09-22 NOTE — Progress Notes (Signed)
PROGRESS NOTE    Christian Garner  JAS:[id number] DOB: 11/02/1945 DOA: 09/16/2017 PCP: [name], MD   Brief Narrative:  71 y.o. WM PMHx Systolic CHF ( EF of 41%),PFX s/p PTCA greater than 1 year ago, Atrial Fibrillation chronically anticoagulated with warfarin, Chronic Respiratory Failure with Hypoxia ( on home oxygen),  CKD( baseline Cr  1.3 ;HD during admission in January 2018), and DM Type 2 controlled with complication (chronic diabetic foot ulcer)  Who presented in transfer from [facility] for evaluation of acute on chronic renal failure.   Patient presented to [name] on December 3 acutely ill with evidence of sepsis.  He was found to have C. difficile colitis.  He was in acute renal failure upon presentation and his renal function failed to improve.  Consultation was sought with Dr. Justin Mend per telephone and recommendation was made for transfer to [facility].  On initial presentation his INR was markedly elevated at 5.8 and increased to 8.4 on the day after admission.  At that point he received treatment w/ vitamin K.  On the day prior to transfer INR was down to 2.5.       Subjective: 12/12 A/O 3 (does not know where). Pleasantly confused. Negative CP, negative SOB, negative abdominal pain. Negative N/V. States diarrhea is beginning to decrease. Main concern is intractable hiccups.   Assessment & Plan:   Principal Problem:   Acute kidney injury superimposed on chronic kidney disease ([place]) Active Problems:   Acute on chronic combined systolic and diastolic CHF (congestive heart failure) (HCC)   AF (paroxysmal atrial fibrillation) (HCC)   Pressure injury of skin   Hyponatremia   CAD (coronary artery disease)   C. difficile colitis   Warfarin anticoagulation   Type 2 diabetes mellitus with left diabetic foot ulcer (HCC)   Anemia due to chronic kidney disease   CKD (chronic kidney disease), stage III ([place])   Acute on CKD stage IIII(Baseline Cr  1.47-2.31) -Nephrology on board: Per EMR felt to be ATN secondary to sepsis.  Recent Labs  Lab 09/17/17 0310 09/18/17 0252 09/19/17 0309 09/20/17 0215 09/21/17 0226 09/22/17 0136  CREATININE 4.67* 4.28* 3.90* 3.69* 3.55* 3.53*  -slowly improving with diuresis.   Acute on Chronic combined Systolic and Diastolic CHF   -Strict in and out since admission -6.0 L -Daily weight Filed Weights   09/20/17 0400 09/21/17 0500 09/22/17 0500  Weight: 234 lb 12.6 oz (106.5 kg) 225 lb 8.5 oz (102.3 kg) 224 lb 13.9 oz (102 kg)  -Amiodarone 200 mg BID -Lasix 80 mg BID -Transfuse for hemoglobin<8  Chronic Paroxysmal Atrial fibrillation with RVR -Currently in NSR -See CHF -Warfarin per pharmacy Recent Labs  Lab 09/17/17 0310 09/18/17 0252 09/19/17 0309 09/20/17 0215 09/21/17 0223 09/22/17 0136  INR 2.62 4.34* 5.78* 3.27 3.34 1.44  -Currently subtherapeutic after receiving 2.5 mg + 5 mg vitamin K secondary to being supratherapeutic. Received secondary to GI bleed.  CAD  Lower GI bleed   -Appears to be resolving. Some continued mild red color in stool most likely secondary to irritation from his Felexiseal. Hemoglobin holding steady continue to monitor closely Recent Labs  Lab 09/17/17 0310 09/18/17 0252 09/19/17 0309 09/20/17 0215 09/21/17 0223 09/22/17 0136  HGB 9.4* 10.2* 9.5* 9.7* 9.3* 9.2*    Mild Acute Delirium vs Dementia   -Unsure of patient's baseline. -Possible uremic component as BUN continues to climb. Creatinine improving hopefully BUN will begin to fall. Monitor for improvement in patients cognitive status.  C.  difficile colitis  -Continue PO vancomycin    C. difficile colitis    Diabetes type 2 Controlled with Complication/LEFT Diabetic Foot Ulcer -12/6 Hemoglobin A1c= 6.1 -Sensitive SSI   Multiple cutaneous injuries -RIGHT lower extremity venous stasis ulcer: LEFT diabetic foot ulcer: Stage II sacral decubitus ulcer -Wound care has been  consulted  Anemia of Chronic Kidney disease/Acute illness    Intractable hiccups -Thorazine IV 25 mg BID PRN     DVT prophylaxis: Warfarin + SCD Code Status: Full Family Communication: None Disposition Plan: TBD   Consultants:  Nephrology    Procedures/Significant Events:  12/3 admit to [facility] w/ sepsis due to C. Diff 12/6 transfer to [facility]  12/7 flexiseal inserted (29 day limit) 12/9 dosed w/ vitamin K 12/10 developed evidence of maroon stools - flexiseal removed     I have personally reviewed and interpreted all radiology studies and my findings are as above.  VENTILATOR SETTINGS:    Cultures 12/6 MRSA by PCR positive 12/6 stool C. difficile antigen positive, C. difficile toxin positive    Antimicrobials: Anti-infectives (From admission, onward)   Start     Stop   09/16/17 1200  vancomycin (VANCOCIN) 50 mg/mL oral solution 500 mg             Devices    LINES / TUBES:      Continuous Infusions: . chlorproMAZINE (THORAZINE) IV       Objective: Vitals:   09/21/17 2000 09/21/17 2335 09/22/17 0500 09/22/17 0730  BP: (!) 118/45 115/60  (!) 105/42  Pulse: 64 62  (!) 58  Resp: 16 19  (!) 25  Temp: 99.1 F (37.3 C) 99.3 F (37.4 C)  98 F (36.7 C)  TempSrc: Bladder Bladder  Oral  SpO2: 97% 95%  99%  Weight:   224 lb 13.9 oz (102 kg)   Height:        Intake/Output Summary (Last 24 hours) at 09/22/2017 9292 Last data filed at 09/21/2017 2100 Gross per 24 hour  Intake 720 ml  Output 725 ml  Net -5 ml   Filed Weights   09/20/17 0400 09/21/17 0500 09/22/17 0500  Weight: 234 lb 12.6 oz (106.5 kg) 225 lb 8.5 oz (102.3 kg) 224 lb 13.9 oz (102 kg)    Examination:  General: A/O 3 (does not know where), No acute respiratory distress Neck:  Negative scars, masses, torticollis, lymphadenopathy, JVD Lungs: Clear to auscultation bilaterally without wheezes or crackles Cardiovascular: Regular rate and rhythm without murmur gallop or  rub normal S1 and S2 Abdomen: negative abdominal pain, nondistended, positive soft, bowel sounds, no rebound, no ascites, no appreciable mass Extremities: No significant cyanosis, clubbing, or edema bilateral lower extremities Skin: Negative rashes, lesions, ulcers Psychiatric:  Cannot fully assess secondary to altered mental status.  Central nervous system:  Cranial nerves II through XII intact, tongue/uvula midline, all extremities muscle strength 5/5, sensation intact throughout,  negative dysarthria, negative expressive aphasia, negative receptive aphasia.  .     Data Reviewed: Care during the described time interval was provided by me .  I have reviewed this patient's available data, including medical history, events of note, physical examination, and all test results as part of my evaluation.   CBC: Recent Labs  Lab 09/16/17 1025  09/18/17 0252 09/19/17 0309 09/20/17 0215 09/21/17 0223 09/22/17 0136  WBC 29.6*   < > 20.9* 16.2* 15.1* 15.7* 19.9*  NEUTROABS 27.5*  --   --   --   --   --   --  HGB 10.2*   < > 10.2* 9.5* 9.7* 9.3* 9.2*  HCT 30.3*   < > 31.7* 29.8* 31.0* 29.6* 29.3*  MCV 89.6   < > 91.4 91.4 93.1 93.1 93.6  PLT 293   < > 276 258 240 241 246   < > = values in this interval not displayed.   Basic Metabolic Panel: Recent Labs  Lab 09/16/17 1025  09/18/17 0252 09/19/17 0309 09/20/17 0215 09/21/17 0226 09/22/17 0136  NA 126*   < > 128* 131* 133* 132* 132*  K 4.2   < > 4.2 3.9 3.9 3.9 4.0  CL 89*   < > 88* 91* 92* 92* 93*  CO2 24   < > 24 29 30 30 29   GLUCOSE 165*   < > 106* 126* 161* 119* 118*  BUN 90*   < > 98* 102* 106* 110* 113*  CREATININE 4.46*   < > 4.28* 3.90* 3.69* 3.55* 3.53*  CALCIUM 7.3*   < > 7.8* 7.8* 7.7* 7.8* 7.6*  MG 2.1  --   --   --   --   --   --   PHOS 6.3*  --  5.9* 5.5* 6.0* 5.4* 5.2*   < > = values in this interval not displayed.   GFR: Estimated Creatinine Clearance: 23.4 mL/min (A) (by C-G formula based on SCr of 3.53 mg/dL  (H)). Liver Function Tests: Recent Labs  Lab 09/16/17 1025 09/17/17 0310  09/18/17 0316 09/19/17 0309 09/20/17 0215 09/21/17 0226 09/22/17 0136  AST 19 18  --  23  --   --   --   --   ALT 12* 12*  --  12*  --   --   --   --   ALKPHOS 144* 138*  --  160*  --   --   --   --   BILITOT 0.9 1.1  --  1.0  --   --   --   --   PROT 4.5* 4.9*  --  5.4*  --   --   --   --   ALBUMIN 1.4* 1.9*   < > 2.0* 1.7* 1.7* 1.7* 1.8*   < > = values in this interval not displayed.   No results for input(s): LIPASE, AMYLASE in the last 168 hours. No results for input(s): AMMONIA in the last 168 hours. Coagulation Profile: Recent Labs  Lab 09/18/17 0252 09/19/17 0309 09/20/17 0215 09/21/17 0223 09/22/17 0136  INR 4.34* 5.78* 3.27 3.34 1.44   Cardiac Enzymes: No results for input(s): CKTOTAL, CKMB, CKMBINDEX, TROPONINI in the last 168 hours. BNP (last 3 results) No results for input(s): PROBNP in the last 8760 hours. HbA1C: No results for input(s): HGBA1C in the last 72 hours. CBG: Recent Labs  Lab 09/21/17 0812 09/21/17 1249 09/21/17 1552 09/21/17 2103 09/22/17 0728  GLUCAP 109* 127* 123* 133* 107*   Lipid Profile: No results for input(s): CHOL, HDL, LDLCALC, TRIG, CHOLHDL, LDLDIRECT in the last 72 hours. Thyroid Function Tests: No results for input(s): TSH, T4TOTAL, FREET4, T3FREE, THYROIDAB in the last 72 hours. Anemia Panel: No results for input(s): VITAMINB12, FOLATE, FERRITIN, TIBC, IRON, RETICCTPCT in the last 72 hours. Urine analysis:    Component Value Date/Time   COLORURINE YELLOW 09/16/2017 1753   APPEARANCEUR HAZY (A) 09/16/2017 1753   LABSPEC >1.030 (H) 09/16/2017 1753   PHURINE 5.0 09/16/2017 1753   GLUCOSEU NEGATIVE 09/16/2017 1753   HGBUR MODERATE (A) 09/16/2017 1753   BILIRUBINUR NEGATIVE 09/16/2017  [place] 09/16/2017 1753   [place] 09/16/2017 1753   NITRITE NEGATIVE 09/16/2017 1753   LEUKOCYTESUR NEGATIVE 09/16/2017 1753    Sepsis Labs: @LABRCNTIP (procalcitonin:4,lacticidven:4)  ) Recent Results (from the past 240 hour(s))  MRSA PCR Screening     Status: Abnormal   Collection Time: 09/16/17 10:38 AM  Result Value Ref Range Status   MRSA by PCR POSITIVE (A) NEGATIVE Final    Comment:        The GeneXpert MRSA Assay (FDA approved for NASAL specimens only), is one component of a comprehensive MRSA colonization surveillance program. It is not intended to diagnose MRSA infection nor to guide or monitor treatment for MRSA infections. RESULT CALLED TO, READ BACK BY AND VERIFIED WITH: H. Deaton RN 12:20 09/16/17 ([email or web address])   C difficile quick scan w PCR reflex     Status: Abnormal   Collection Time: 09/16/17  1:24 PM  Result Value Ref Range Status   C Diff antigen POSITIVE (A) NEGATIVE Final   C Diff toxin NEGATIVE NEGATIVE Final   C Diff interpretation Results are indeterminate. See PCR results.  Final  Clostridium Difficile by PCR     Status: Abnormal   Collection Time: 09/16/17  1:24 PM  Result Value Ref Range Status   Toxigenic C Difficile by pcr POSITIVE (A) NEGATIVE Final    Comment: Positive for toxigenic C. difficile with little to no toxin production. Only treat if clinical presentation suggests symptomatic illness.         Radiology Studies: No results found.      Scheduled Meds: . amiodarone  200 mg Oral BID  . atorvastatin  40 mg Oral QHS  . feeding supplement (PRO-STAT SUGAR FREE 64)  30 mL Oral TID BM  . ferrous sulfate  325 mg Oral Q breakfast  . furosemide  80 mg Intravenous Q12H  . hydrocortisone  25 mg Rectal BID  . insulin aspart  0-9 Units Subcutaneous TID WC  . mouth rinse  15 mL Mouth Rinse BID  . pantoprazole  20 mg Oral Daily  . sodium chloride flush  3 mL Intravenous Q12H  . traZODone  50 mg Oral QHS  . vancomycin  500 mg Oral Q6H  . vitamin B-12  1,000 mcg Oral Daily   Continuous Infusions: . chlorproMAZINE (THORAZINE) IV       LOS: 6 days    Time  spent: 40 minutes    Coreena Rubalcava, Geraldo Docker, MD [facility] Pager [phone]   If 7PM-7AM, please contact night-coverage [email or web address] Password [facility] 09/22/2017, 8:23 AM

## 2017-09-23 ENCOUNTER — Inpatient Hospital Stay (HOSPITAL_COMMUNITY): Payer: Medicare Other

## 2017-09-23 DIAGNOSIS — R197 Diarrhea, unspecified: Secondary | ICD-10-CM

## 2017-09-23 DIAGNOSIS — R410 Disorientation, unspecified: Secondary | ICD-10-CM

## 2017-09-23 DIAGNOSIS — F0150 Vascular dementia without behavioral disturbance: Secondary | ICD-10-CM

## 2017-09-23 DIAGNOSIS — E119 Type 2 diabetes mellitus without complications: Secondary | ICD-10-CM

## 2017-09-23 DIAGNOSIS — D649 Anemia, unspecified: Secondary | ICD-10-CM

## 2017-09-23 DIAGNOSIS — Z7901 Long term (current) use of anticoagulants: Secondary | ICD-10-CM

## 2017-09-23 LAB — RENAL FUNCTION PANEL
ALBUMIN: 1.7 g/dL — AB (ref 3.5–5.0)
Anion gap: 10 (ref 5–15)
BUN: 117 mg/dL — AB (ref 6–20)
CALCIUM: 7.6 mg/dL — AB (ref 8.9–10.3)
CO2: 31 mmol/L (ref 22–32)
Chloride: 92 mmol/L — ABNORMAL LOW (ref 101–111)
Creatinine, Ser: 3.24 mg/dL — ABNORMAL HIGH (ref 0.61–1.24)
GFR calc Af Amer: 20 mL/min — ABNORMAL LOW (ref >60)
GFR, EST NON AFRICAN AMERICAN: 18 mL/min — AB (ref >60)
GLUCOSE: 142 mg/dL — AB (ref 65–99)
PHOSPHORUS: 5.5 mg/dL — AB (ref 2.5–4.6)
Potassium: 4.2 mmol/L (ref 3.5–5.1)
Sodium: 133 mmol/L — ABNORMAL LOW (ref 135–145)

## 2017-09-23 LAB — CBC
HCT: 29.5 % — ABNORMAL LOW (ref 39.0–52.0)
Hemoglobin: 9.1 g/dL — ABNORMAL LOW (ref 13.0–17.0)
MCH: 29 pg (ref 26.0–34.0)
MCHC: 30.8 g/dL (ref 30.0–36.0)
MCV: 93.9 fL (ref 78.0–100.0)
PLATELETS: 259 10*3/uL (ref 150–400)
RBC: 3.14 MIL/uL — AB (ref 4.22–5.81)
RDW: 17.3 % — ABNORMAL HIGH (ref 11.5–15.5)
WBC: 20.7 10*3/uL — AB (ref 4.0–10.5)

## 2017-09-23 LAB — GLUCOSE, CAPILLARY
GLUCOSE-CAPILLARY: 173 mg/dL — AB (ref 65–99)
Glucose-Capillary: 131 mg/dL — ABNORMAL HIGH (ref 65–99)
Glucose-Capillary: 142 mg/dL — ABNORMAL HIGH (ref 65–99)
Glucose-Capillary: 161 mg/dL — ABNORMAL HIGH (ref 65–99)
Glucose-Capillary: 219 mg/dL — ABNORMAL HIGH (ref 65–99)

## 2017-09-23 LAB — MAGNESIUM: MAGNESIUM: 2.6 mg/dL — AB (ref 1.7–2.4)

## 2017-09-23 MED ORDER — METRONIDAZOLE IN NACL 5-0.79 MG/ML-% IV SOLN
500.0000 mg | Freq: Four times a day (QID) | INTRAVENOUS | Status: DC
  Administered 2017-09-23: 500 mg via INTRAVENOUS
  Filled 2017-09-23 (×3): qty 100

## 2017-09-23 MED ORDER — FAMOTIDINE 20 MG PO TABS
20.0000 mg | ORAL_TABLET | Freq: Every day | ORAL | Status: DC
  Administered 2017-09-23 – 2017-09-24 (×2): 20 mg via ORAL
  Filled 2017-09-23 (×3): qty 1

## 2017-09-23 MED ORDER — METRONIDAZOLE IN NACL 5-0.79 MG/ML-% IV SOLN
500.0000 mg | Freq: Three times a day (TID) | INTRAVENOUS | Status: DC
  Administered 2017-09-24 – 2017-09-29 (×17): 500 mg via INTRAVENOUS
  Filled 2017-09-23 (×17): qty 100

## 2017-09-23 NOTE — Progress Notes (Signed)
Occupational Therapy Treatment Patient Details Name: Christian Garner MRN: [id number] DOB: 1946/09/13 Today's Date: 09/23/2017    History of present illness 71 y.o. male with history of systolic CHF with an EF of 30% on home oxygen, CAD s/p PTCA greater than 1 year ago, a-fib chronically anticoagulated with warfarin, CKD with a baseline creatinine of 1.3 (hemodialysis during admission in January 2018), and DM2 with chronic diabetic foot ulcer who presented in transfer from [name] on 09/16/17 for evaluation of acute on chronic renal failure. Pt found to have sepsis due to c diff.   OT comments  Pt able to perform stand pivot transfer today with max assist +2, max cues for sequencing and safety. Pt set up with food tray and able to self feed lunch. D/c plan remains appropriate. Will continue to follow acutely.   Follow Up Recommendations  SNF;Supervision/Assistance - 24 hour    Equipment Recommendations  3 in 1 bedside commode    Recommendations for Other Services      Precautions / Restrictions Precautions Precautions: Fall Restrictions Weight Bearing Restrictions: No       Mobility Bed Mobility Overal bed mobility: Needs Assistance Bed Mobility: Supine to Sit     Supine to sit: Mod assist     General bed mobility comments: Cues for initiation and sequencing. Assist for LEs to EOB and trunk elevation to sitting with HHA.  Transfers Overall transfer level: Needs assistance Equipment used: Rolling walker (2 wheeled) Transfers: Sit to/from [facility] Sit to Stand: Mod assist;+2 physical assistance Stand pivot transfers: Max assist;+2 physical assistance       General transfer comment: to boost up from EOB x3 with max cues for sequencing and safety. Stand pivot to R side with max cues    Balance Overall balance assessment: Needs assistance Sitting-balance support: Feet supported Sitting balance-Leahy Scale: Fair     Standing balance  support: Bilateral upper extremity supported Standing balance-Leahy Scale: Poor                             ADL either performed or assessed with clinical judgement   ADL Overall ADL's : Needs assistance/impaired Eating/Feeding: Set up;Sitting Eating/Feeding Details (indicate cue type and reason): to eat lunch, cues for initiation                     Toilet Transfer: Maximal assistance;+2 for physical assistance;Stand-pivot;RW Toilet Transfer Details (indicate cue type and reason): Simulated by sit to stand from EOB with stand pivot transfer to chair         Functional mobility during ADLs: Maximal assistance;+2 for physical assistance;Rolling walker;Cueing for sequencing(for stand pivot only)       Vision       Perception     Praxis      Cognition Arousal/Alertness: Awake/alert Behavior During Therapy: WFL for tasks assessed/performed Overall Cognitive Status: No family/caregiver present to determine baseline cognitive functioning                         Following Commands: Follows one step commands with increased time;Follows one step commands inconsistently Safety/Judgement: Decreased awareness of deficits;Decreased awareness of safety              Exercises     Shoulder Instructions       General Comments      Pertinent Vitals/ Pain       Pain Assessment:  Faces Faces Pain Scale: Hurts even more Pain Location: buttocks Pain Descriptors / Indicators: Discomfort Pain Intervention(s): Monitored during session;Limited activity within patient's tolerance;Repositioned  Home Living                                          Prior Functioning/Environment              Frequency  Min 2X/week        Progress Toward Goals  OT Goals(current goals can now be found in the care plan section)  Progress towards OT goals: Progressing toward goals  Acute Rehab OT Goals Patient Stated Goal: get some rest OT  Goal Formulation: With patient  Plan Discharge plan remains appropriate    Co-evaluation    PT/OT/SLP Co-Evaluation/Treatment: Yes Reason for Co-Treatment: Complexity of the patient's impairments (multi-system involvement);For patient/therapist safety;Necessary to address cognition/behavior during functional activity   OT goals addressed during session: ADL's and self-care      AM-PAC PT "6 Clicks" Daily Activity     Outcome Measure   Help from another person eating meals?: A Little Help from another person taking care of personal grooming?: A Lot Help from another person toileting, which includes using toliet, bedpan, or urinal?: A Lot Help from another person bathing (including washing, rinsing, drying)?: A Lot Help from another person to put on and taking off regular upper body clothing?: A Lot Help from another person to put on and taking off regular lower body clothing?: A Lot 6 Click Score: 13    End of Session Equipment Utilized During Treatment: Rolling walker;Gait belt;Oxygen  OT Visit Diagnosis: Unsteadiness on feet (R26.81);Other abnormalities of gait and mobility (R26.89);Pain Pain - part of body: (buttocks)   Activity Tolerance Patient tolerated treatment well   Patient Left in chair;with call bell/phone within reach   Nurse Communication          Time: 0981-1914 OT Time Calculation (min): 23 min  Charges: OT General Charges $OT Visit: 1 Visit OT Treatments $Therapeutic Activity: 8-22 mins  Aren Cherne A. Ulice Brilliant, M.S., OTR/L Pager: [place] 09/23/2017, 4:48 PM

## 2017-09-23 NOTE — Progress Notes (Addendum)
PROGRESS NOTE    Christian Garner  BZJ:[id number] DOB: 1945-10-16 DOA: 09/16/2017 PCP: [name], MD   Brief Narrative:  71 y.o. WM PMHx Systolic CHF ( EF of 01%),BPZ s/p PTCA greater than 1 year ago, Atrial Fibrillation chronically anticoagulated with warfarin, Chronic Respiratory Failure with Hypoxia ( on home oxygen),  CKD( baseline Cr  1.3 ;HD during admission in January 2018), and DM Type 2 controlled with complication (chronic diabetic foot ulcer)  Who presented in transfer from [facility] for evaluation of acute on chronic renal failure.   Patient presented to [facility] on December 3 acutely ill with evidence of sepsis.  He was found to have C. difficile colitis.  He was in acute renal failure upon presentation and his renal function failed to improve.  Consultation was sought with Dr. Justin Mend per telephone and recommendation was made for transfer to [facility].  On initial presentation his INR was markedly elevated at 5.8 and increased to 8.4 on the day after admission.  At that point he received treatment w/ vitamin K.  On the day prior to transfer INR was down to 2.5.       Subjective: 12/13 A/O 4, still confused (baseline?). Negative CP, negative SOB, negative abdominal pain. Negative N/V. Positive continued diarrhea. Positive melena. Hiccups resolved. Patient states never had colonoscopy (some mental confusion)       Assessment & Plan:   Principal Problem:   Acute kidney injury superimposed on chronic kidney disease ([place]) Active Problems:   Acute on chronic combined systolic and diastolic CHF (congestive heart failure) (HCC)   AF (paroxysmal atrial fibrillation) (HCC)   Pressure injury of skin   Hyponatremia   CAD (coronary artery disease)   C. difficile colitis   Warfarin anticoagulation   Type 2 diabetes mellitus with left diabetic foot ulcer (HCC)   Anemia due to chronic kidney disease   CKD (chronic kidney disease), stage III ([place])   Acute on CKD stage  IIII(Baseline Cr 1.47-2.31) -Nephrology on board. Per EMR felt to be ATN secondary to sepsis  Recent Labs  Lab 09/18/17 0252 09/19/17 0309 09/20/17 0215 09/21/17 0226 09/22/17 0136 09/23/17 0444  CREATININE 4.28* 3.90* 3.69* 3.55* 3.53* 3.24*  -continues to slowly improve with diuresis.   Acute on Chronic combined Systolic and Diastolic CHF   -Strict in and out since admission -6.3L -Daily weight Filed Weights   09/21/17 0500 09/22/17 0500 09/23/17 0500  Weight: 225 lb 8.5 oz (102.3 kg) 224 lb 13.9 oz (102 kg) 220 lb 10.9 oz (100.1 kg)  -amiodarone 200 mg BID -Lasix 80 mg BID -Transfuse for hemoglobin<8  Chronic Paroxysmal Atrial fibrillation with RVR -Currently in NSR -See CHF -Warfarin per pharmacy Recent Labs  Lab 09/17/17 0310 09/18/17 0252 09/19/17 0309 09/20/17 0215 09/21/17 0223 09/22/17 0136  INR 2.62 4.34* 5.78* 3.27 3.34 1.44  -Currently subtherapeutic after receiving 2.5 mg + 5 mg vitamin K secondary to being supratherapeutic. Received secondary to GI bleed. -Continue to hold anticoagulation patient with continued diarrhea/melena  CAD  Upper GI bleed   -Witnessed melanic diarrhea stools today: Hemoglobin slowly trending down. Recent Labs  Lab 09/18/17 0252 09/19/17 0309 09/20/17 0215 09/21/17 0223 09/22/17 0136 09/23/17 0444  HGB 10.2* 9.5* 9.7* 9.3* 9.2* 9.1*  -Will consult GI. The patient's acute infection with C. difficile and slow decrease in hemoglobin do not believe candidate for invasive procedure.   Mild Acute Delirium vs Dementia   -After patient's baseline -BUN continues to climb. Most likely uremic  component, await nephrology recommendation watchful waiting vs CRRT? .  C. difficile colitis  -PO Vancomycin 10 days       Diabetes type 2 Controlled with Complication/LEFT Diabetic Foot Ulcer -12/6 Hemoglobin A1c= 6.1 -Sensitive SSI   Multiple cutaneous injuries -RIGHT lower extremity venous stasis ulcer: LEFT diabetic foot ulcer:  Stage II sacral decubitus ulcer -Wound care has been consulted  1. Surgical site; left lateral 5th toe amputation  2. Venous stasis: RLE x 2 ruptured bulla, most likely related to edema 3. Stage 2 Pressure injury upper gluteal cleft with moisture component, patient incontinent of stool  Pressure Injury POA: Yes Measurement: Gluteal cleft: 1.5cm x 0.5cm x 0.2cm  Left lateral amputation site: 4cm x 1cm x 0.1cm  Right medial lower leg: 3cm x 2cm x 0.1cm with 1/2 blister roof intact Right posterior lower leg: 3cm x 1.5cm x 0.1cm  Wound bed:all sites are clean, pink, moist Drainage (amount, consistency, odor) minimal, no odor Periwound: intact, palpable pulses distally, warm LE Dressing procedure/placement/frequency: Moisture barrier cream only for the gluteal cleft wound, with his frequent incontinence of stool the sacral dressing is pulling stool into the gluteal cleft, creating even more moisture.   Silicone foam to the right LE wounds and the left lateral foot wound. Change every 3 days and PRN soilage.     Anemia of Chronic Kidney disease/Acute illness    Intractable hiccups -Resolved     DVT prophylaxis: Warfarin + SCD Code Status: Full Family Communication: None Disposition Plan: TBD   Consultants:  Nephrology    Procedures/Significant Events:  12/3 admit to [facility] w/ sepsis due to C. Diff 12/6 transfer to [facility]  12/7 flexiseal inserted (29 day limit) 12/9 dosed w/ vitamin K 12/10 developed evidence of maroon stools - flexiseal removed     I have personally reviewed and interpreted all radiology studies and my findings are as above.  VENTILATOR SETTINGS:    Cultures 12/6 MRSA by PCR positive 12/6 stool C. difficile antigen positive, C. difficile toxin positive    Antimicrobials: Anti-infectives (From admission, onward)   Start     Stop   09/16/17 1200  vancomycin (VANCOCIN) 50 mg/mL oral solution 500 mg             Devices    LINES /  TUBES:      Continuous Infusions:    Objective: Vitals:   09/22/17 2000 09/23/17 0000 09/23/17 0500 09/23/17 0650  BP: (!) 110/49 (!) 102/41  (!) 106/43  Pulse: 61 61    Resp: 14 14    Temp: 97.7 F (36.5 C) 97.7 F (36.5 C)  98.8 F (37.1 C)  TempSrc: Bladder Oral  Axillary  SpO2: 99% 97%    Weight:   220 lb 10.9 oz (100.1 kg)   Height:        Intake/Output Summary (Last 24 hours) at 09/23/2017 0834 Last data filed at 09/22/2017 1700 Gross per 24 hour  Intake -  Output 1200 ml  Net -1200 ml   Filed Weights   09/21/17 0500 09/22/17 0500 09/23/17 0500  Weight: 225 lb 8.5 oz (102.3 kg) 224 lb 13.9 oz (102 kg) 220 lb 10.9 oz (100.1 kg)    Physical Exam:  General: A/O 4, still mild confusion. No acute respiratory distress Neck:  Negative scars, masses, torticollis, lymphadenopathy, JVD Lungs: Clear to auscultation bilaterally without wheezes or crackles Cardiovascular: Regular rate and rhythm without murmur gallop or rub normal S1 and S2 Abdomen: negative abdominal pain, nondistended, positive  soft, bowel sounds, no rebound, no ascites, no appreciable mass. Melanic stool (watery diarrhea) Extremities: No significant cyanosis, clubbing, or edema bilateral lower extremities Skin: left lateral 5th toe amputation: Venous stasis: RLE x 2 ruptured bulla;  Stage 2 Pressure injury upper gluteal cleft  Psychiatric:  Negative depression, negative anxiety, negative fatigue, negative mania  Central nervous system:  Cranial nerves II through XII intact, tongue/uvula midline, all extremities muscle strength 5/5, sensation intact throughout,  negative dysarthria, negative expressive aphasia, negative receptive aphasia. .     Data Reviewed: Care during the described time interval was provided by me .  I have reviewed this patient's available data, including medical history, events of note, physical examination, and all test results as part of my evaluation.   CBC: Recent Labs    Lab 09/16/17 1025  09/19/17 0309 09/20/17 0215 09/21/17 0223 09/22/17 0136 09/23/17 0444  WBC 29.6*   < > 16.2* 15.1* 15.7* 19.9* 20.7*  NEUTROABS 27.5*  --   --   --   --   --   --   HGB 10.2*   < > 9.5* 9.7* 9.3* 9.2* 9.1*  HCT 30.3*   < > 29.8* 31.0* 29.6* 29.3* 29.5*  MCV 89.6   < > 91.4 93.1 93.1 93.6 93.9  PLT 293   < > 258 240 241 246 259   < > = values in this interval not displayed.   Basic Metabolic Panel: Recent Labs  Lab 09/16/17 1025  09/19/17 0309 09/20/17 0215 09/21/17 0226 09/22/17 0136 09/23/17 0444  NA 126*   < > 131* 133* 132* 132* 133*  K 4.2   < > 3.9 3.9 3.9 4.0 4.2  CL 89*   < > 91* 92* 92* 93* 92*  CO2 24   < > 29 30 30 29 31   GLUCOSE 165*   < > 126* 161* 119* 118* 142*  BUN 90*   < > 102* 106* 110* 113* 117*  CREATININE 4.46*   < > 3.90* 3.69* 3.55* 3.53* 3.24*  CALCIUM 7.3*   < > 7.8* 7.7* 7.8* 7.6* 7.6*  MG 2.1  --   --   --   --   --  2.6*  PHOS 6.3*   < > 5.5* 6.0* 5.4* 5.2* 5.5*   < > = values in this interval not displayed.   GFR: Estimated Creatinine Clearance: 25.2 mL/min (A) (by C-G formula based on SCr of 3.24 mg/dL (H)). Liver Function Tests: Recent Labs  Lab 09/16/17 1025 09/17/17 0310  09/18/17 0316 09/19/17 0309 09/20/17 0215 09/21/17 0226 09/22/17 0136 09/23/17 0444  AST 19 18  --  23  --   --   --   --   --   ALT 12* 12*  --  12*  --   --   --   --   --   ALKPHOS 144* 138*  --  160*  --   --   --   --   --   BILITOT 0.9 1.1  --  1.0  --   --   --   --   --   PROT 4.5* 4.9*  --  5.4*  --   --   --   --   --   ALBUMIN 1.4* 1.9*   < > 2.0* 1.7* 1.7* 1.7* 1.8* 1.7*   < > = values in this interval not displayed.   No results for input(s): LIPASE, AMYLASE in the last 168 hours. No  results for input(s): AMMONIA in the last 168 hours. Coagulation Profile: Recent Labs  Lab 09/18/17 0252 09/19/17 0309 09/20/17 0215 09/21/17 0223 09/22/17 0136  INR 4.34* 5.78* 3.27 3.34 1.44   Cardiac Enzymes: No results for  input(s): CKTOTAL, CKMB, CKMBINDEX, TROPONINI in the last 168 hours. BNP (last 3 results) No results for input(s): PROBNP in the last 8760 hours. HbA1C: No results for input(s): HGBA1C in the last 72 hours. CBG: Recent Labs  Lab 09/21/17 2103 09/22/17 0728 09/22/17 1134 09/23/17 0014 09/23/17 0816  GLUCAP 133* 107* 139* 142* 131*   Lipid Profile: No results for input(s): CHOL, HDL, LDLCALC, TRIG, CHOLHDL, LDLDIRECT in the last 72 hours. Thyroid Function Tests: No results for input(s): TSH, T4TOTAL, FREET4, T3FREE, THYROIDAB in the last 72 hours. Anemia Panel: No results for input(s): VITAMINB12, FOLATE, FERRITIN, TIBC, IRON, RETICCTPCT in the last 72 hours. Urine analysis:    Component Value Date/Time   COLORURINE YELLOW 09/16/2017 1753   APPEARANCEUR HAZY (A) 09/16/2017 1753   LABSPEC >1.030 (H) 09/16/2017 1753   PHURINE 5.0 09/16/2017 1753   GLUCOSEU NEGATIVE 09/16/2017 1753   HGBUR MODERATE (A) 09/16/2017 1753   BILIRUBINUR NEGATIVE 09/16/2017 1753   KETONESUR NEGATIVE 09/16/2017 1753   PROTEINUR NEGATIVE 09/16/2017 1753   NITRITE NEGATIVE 09/16/2017 1753   LEUKOCYTESUR NEGATIVE 09/16/2017 1753   Sepsis Labs: @LABRCNTIP (procalcitonin:4,lacticidven:4)  ) Recent Results (from the past 240 hour(s))  MRSA PCR Screening     Status: Abnormal   Collection Time: 09/16/17 10:38 AM  Result Value Ref Range Status   MRSA by PCR POSITIVE (A) NEGATIVE Final    Comment:        The GeneXpert MRSA Assay (FDA approved for NASAL specimens only), is one component of a comprehensive MRSA colonization surveillance program. It is not intended to diagnose MRSA infection nor to guide or monitor treatment for MRSA infections. RESULT CALLED TO, READ BACK BY AND VERIFIED WITH: H. Deaton RN 12:20 09/16/17 ([email or web address])   C difficile quick scan w PCR reflex     Status: Abnormal   Collection Time: 09/16/17  1:24 PM  Result Value Ref Range Status   C Diff antigen POSITIVE (A) NEGATIVE  Final   C Diff toxin NEGATIVE NEGATIVE Final   C Diff interpretation Results are indeterminate. See PCR results.  Final  Clostridium Difficile by PCR     Status: Abnormal   Collection Time: 09/16/17  1:24 PM  Result Value Ref Range Status   Toxigenic C. Difficile by PCR POSITIVE (A) NEGATIVE Final    Comment: Positive for toxigenic C. difficile with little to no toxin production. Only treat if clinical presentation suggests symptomatic illness.         Radiology Studies: No results found.      Scheduled Meds: . amiodarone  200 mg Oral BID  . atorvastatin  40 mg Oral QHS  . feeding supplement (NEPRO CARB STEADY)  237 mL Oral TID BM  . feeding supplement (PRO-STAT SUGAR FREE 64)  30 mL Oral TID BM  . ferrous sulfate  325 mg Oral Q breakfast  . furosemide  80 mg Intravenous Q12H  . hydrocortisone  25 mg Rectal BID  . insulin aspart  0-9 Units Subcutaneous TID WC  . mouth rinse  15 mL Mouth Rinse BID  . pantoprazole  20 mg Oral Daily  . sodium chloride flush  3 mL Intravenous Q12H  . traZODone  50 mg Oral QHS  . vancomycin  500 mg Oral Q6H  . vitamin  B-12  1,000 mcg Oral Daily   Continuous Infusions:    LOS: 7 days    Time spent: 40 minutes    Fatima Fedie, Geraldo Docker, MD [facility] Pager [phone]   If 7PM-7AM, please contact night-coverage [email or web address] Password [facility] 09/23/2017, 8:34 AM

## 2017-09-23 NOTE — Clinical Social Work Note (Signed)
Clinical Social Work Assessment  Patient Details  Name: Christian Garner MRN: [id number] Date of Birth: 1946/08/23  Date of referral:  09/23/17               Reason for consult:  Facility Placement, Discharge Planning                Permission sought to share information with:  Facility Sport and exercise psychologist, Family Supports Permission granted to share information::  Yes, Verbal Permission Granted  Name::     Christian Garner  Agency::  SNF's  Relationship::  Wife  Contact Information:  [phone]  Housing/Transportation Living arrangements for the past 2 months:  [place] of Information:  Medical Team, Spouse Patient Interpreter Needed:  None Criminal Activity/Legal Involvement Pertinent to Current Situation/Hospitalization:  No - Comment as needed Significant Relationships:  Adult Children, Spouse Lives with:  Spouse Do you feel safe going back to the place where you live?  Yes Need for family participation in patient care:  Yes (Comment)  Care giving concerns:  PT recommending SNF placement once medically stable for discharge.   Social Worker assessment / plan:  Patient not fully oriented. No supports at bedside. CSW called patient's wife, introduced role, and explained that PT recommendations would be discussed. Patient's wife is agreeable to SNF placement at [facility]. She stated this is typically what happens: He goes into the hospital then discharges to [facility] for rehab. This facility is across the street from where they live. CSW left a message for admissions coordinator to review referral. No further concerns. CSW encouraged patient's wife to contact CSW as needed. CSW will continue to follow patient and his wife for support and facilitate discharge to SNF once medically stable.  Employment status:  Retired Forensic scientist:  Medicare PT Recommendations:  [place] / Referral to community resources:  [place]  Patient/Family's Response to care:  Patient not fully oriented. Patient's wife agreeable to SNF placement. Patient's wife and daughter supportive and involved in patient's care. Patient's wife appreciated social work intervention.  Patient/Family's Understanding of and Emotional Response to Diagnosis, Current Treatment, and Prognosis:  Patient not fully oriented. Patient's wife has a good understanding of the reason for admission and his need for rehab prior to returning home. Patient's wife appears happy with hospital care.  Emotional Assessment Appearance:  Appears stated age Attitude/Demeanor/Rapport:  Unable to Assess Affect (typically observed):  Unable to Assess Orientation:  Oriented to Self, Oriented to Place, Oriented to  Time Alcohol / Substance use:  Never Used Psych involvement (Current and /or in the community):  No (Comment)  Discharge Needs  Concerns to be addressed:  Care Coordination Readmission within the last 30 days:  No Current discharge risk:  Cognitively Impaired, Dependent with Mobility Barriers to Discharge:  Continued Medical Work up   Candie Chroman, LCSW 09/23/2017, 4:07 PM

## 2017-09-23 NOTE — Care Management Note (Signed)
Case Management Note  Patient Details  Name: Christian Garner MRN: [id number] Date of Birth: Aug 18, 1946  Subjective/Objective:     Pt admitted with sepsis from C diff               Action/Plan:   PTA from home with wife.  Pt has required flexiseal and is confused, requiring both IV lasix and albumin.  Pt will need PT eval once appropriate - CSW informed of potential SNF recommendation.  CM will continue to follow for discharge needs   Expected Discharge Date:                  Expected Discharge Plan:  Home/Self Care  In-House Referral:     Discharge planning Services  CM Consult  Post Acute Care Choice:    Choice offered to:     DME Arranged:    DME Agency:     HH Arranged:    HH Agency:     Status of Service:     If discussed at [employer] of Stay Meetings, dates discussed:    Additional Comments: 09/23/2017 Pt remains appropriate for continued stay.  Pt now having bloody stools - GI consulted.  Pt also being considered for initiation of HD/CRRT.  CSW following per SNF recommendation [name], RN 09/23/2017, 4:18 PM

## 2017-09-23 NOTE — Consult Note (Signed)
[facility] Consult: 12:05 PM 09/23/2017  LOS: 7 days    Referring Provider: Dr Sherral Hammers  Primary Care Physician:  [name], MD in [place] Primary Gastroenterologist:  unassigned    Reason for Consultation:  Maroon stools.     HPI: Christian Garner is a 71 y.o. male.  hx CKD.  CAD, 2014 cardiac stent.  Not on Plavix.  Ischemic CM, CHF, EF 30%.  Cor pulmonale.  Chronic hypercapnic respiratory failure.  Obesity.  Afib on Coumadin.  Diet controlled DM.  Chronic diabetic foot ulder.    S/p toe amputation.  Venous stasis LE.  Hepatosplenomegaly, ? Hepatic steatosis and TSTC liver hypodensity per 2012 CT angio at [facility].. CKD aith AKI requiring HD in 10/2016.    Chronic normocytic anemia, Hgb 9 to 10.5 in the 2 months, ~ 11 several months ago.  FOBT negative on 06/08/17.       Admission 06/07/17 -06/12/17 with heart failure, lower extremity stasis ulcers.  Underwent thoracentesis for pleural effusion, cytology benign. Now admitted as transfer from [facility] with C diff infection, sepsis, AKI, pulmonary edema, pleural effusions.    Patient developed diarrhea within a few days of discharging from a nursing facility in late October/early November.  His PMD chose to treat him empirically for C. difficile.  Not clear what antibiotic was used or if it was metronidazole versus vancomycin versus something else.  He took this for about 10 days and his stools normalize they were formed.  Additionally he started to get energy back, appetite returned, he got stronger was more mobile and generally the family was very happy with his progress.  Around 11/29 the patient had recurrence of diarrheal stools which were not bloody.  He was having multiple stools a day and his general well-being took a sharp decline, the daughter says  within 24 hours he was confused was not able to get up.  He was brought to [facility] and ultimately transferred to [name].  He is receiving oral vancomycin for positive C. Difficile, day 8.  He continues to have multiple stools.  For at least 2 days, perhaps longer the stools have become maroon in appearance.  Patient denies abdominal pain, nausea.  However he is confused.  His daughter tells me that yesterday when she was visiting he had a single episode of blackish/green emesis. Patient's INR has been supratherapeutic.  At [facility] it was 5.7 on 12/9.  He received vitamin K IV on 09/21/17.  Today it is 1.4.  Coumadin has been on hold for this entire admission.  Received IV heparin from 12 6 -09/17/17.  Has not received any transfusion of blood products. He has been hypotensive as low as 90s/40s. Not had any radiologic imaging of the abdomen  Patient's daughter was very helpful in providing background history.  She denies previous colonoscopy or endoscopy for the patient.  No previous ulcer disease or colitis.  No previous GI bleeding.  She is not aware that he was ever taking iron supplements for anemia.  Also denies unusual bleeding or  bruising.     Past Medical History:  Diagnosis Date  . CAD (coronary artery disease)   . Cellulitis and abscess of lower extremity   . CHF (congestive heart failure) (HCC)    EF 30%  . Chronic kidney disease, stage III (moderate) (HCC)   . DM (diabetes mellitus) ([place])     Past Surgical History:  Procedure Laterality Date  . CORONARY ANGIOPLASTY WITH STENT PLACEMENT    . HAND SURGERY     At [facility], around 2014    Prior to Admission medications   Medication Sig Start Date End Date Taking? Authorizing Provider  acetaminophen (TYLENOL) 325 MG tablet Take 975 mg by mouth at bedtime as needed for mild pain.    Yes [provider]  amiodarone (PACERONE) 200 MG tablet Take 1 tablet (200 mg total) by mouth 2 (two) times daily.  04/05/17  Yes Allie Bossier, MD  atorvastatin (LIPITOR) 40 MG tablet Take 40 mg by mouth at bedtime.    Yes [provider]  bismuth subsalicylate (PEPTO BISMOL) 262 MG/15ML suspension Take 30 mLs by mouth every 6 (six) hours as needed for indigestion.   Yes [provider]  bumetanide (BUMEX) 2 MG tablet Take 2 tablets (4 mg total) by mouth 2 (two) times daily. Patient taking differently: Take 3 mg by mouth 2 (two) times daily.  06/12/17  Yes Tawny Asal, MD  diphenoxylate-atropine (LOMOTIL) 2.5-0.025 MG tablet Take 1 tablet by mouth 4 (four) times daily as needed for diarrhea or loose stools. 08/06/17  Yes Stover, Titorya, DPM  ferrous sulfate 325 (65 FE) MG tablet Take 325 mg by mouth daily with breakfast.   Yes [provider]  fluticasone (FLONASE) 50 MCG/ACT nasal spray Place 1 spray into both nostrils daily as needed for allergies or rhinitis.   Yes [provider]  nitroGLYCERIN (NITROSTAT) 0.4 MG SL tablet Place 0.4 mg under the tongue every 5 (five) minutes as needed for chest pain.   Yes [provider]  sitaGLIPtin (JANUVIA) 100 MG tablet Take 100 mg by mouth daily.   Yes [provider]  traZODone (DESYREL) 50 MG tablet Take 50 mg by mouth at bedtime.   Yes [provider]  vitamin B-12 (CYANOCOBALAMIN) 1000 MCG tablet Take 1,000 mcg by mouth daily.   Yes [provider]  warfarin (COUMADIN) 1 MG tablet Take 1 mg by mouth daily. Take 1/2 tablet (0.5mg ) by mouth once daily with 4mg  tablet to equal 4.5mg  daily   Yes [provider]  warfarin (COUMADIN) 2 MG tablet Take 1 tablet (2 mg total) by mouth daily. Patient taking differently: Take 4 mg by mouth See admin instructions. Take 2 tablets (4mg ) by mouth once daily with 1/2 1mg  tablet (0.5mg ) to equal 4.5mg  daily 06/12/17  Yes Tawny Asal, MD  calcium carbonate (TUMS - DOSED IN MG ELEMENTAL CALCIUM) 500 MG chewable tablet Chew 1 tablet by mouth 4 (four) times  daily as needed for indigestion or heartburn.     [provider]  metolazone (ZAROXOLYN) 5 MG tablet Take 1 tablet (5 mg total) by mouth daily. 30 minutes before Bumex dose Patient not taking: Reported on 09/16/2017 06/12/17   Tawny Asal, MD  potassium chloride SA (K-DUR,KLOR-CON) 20 MEQ tablet Take 2 tablets (40 mEq total) by mouth 2 (two) times daily. Patient not taking: Reported on 09/16/2017 06/12/17   Tawny Asal, MD    Scheduled Meds: . amiodarone  200 mg Oral BID  . atorvastatin  40 mg Oral QHS  . feeding supplement (NEPRO CARB STEADY)  237 mL Oral TID BM  . feeding supplement (PRO-STAT SUGAR FREE 64)  30 mL Oral TID BM  . ferrous sulfate  325 mg Oral Q breakfast  . furosemide  80 mg Intravenous Q12H  . hydrocortisone  25 mg Rectal BID  . insulin aspart  0-9 Units Subcutaneous TID WC  . mouth rinse  15 mL Mouth Rinse BID  . pantoprazole  20 mg Oral Daily  . sodium chloride flush  3 mL Intravenous Q12H  . traZODone  50 mg Oral QHS  . vancomycin  500 mg Oral Q6H  . vitamin B-12  1,000 mcg Oral Daily   Infusions:  PRN Meds: acetaminophen **OR** acetaminophen, calcium carbonate, chlorproMAZINE (THORAZINE) injection, fluticasone, nitroGLYCERIN, ondansetron **OR** ondansetron (ZOFRAN) IV, oxyCODONE, sodium chloride   Allergies as of 09/14/2017  . (No Known Allergies)    Family History  Problem Relation Age of Onset  . Hypertension Mother   . Diabetes Mother   . Bone cancer Mother   . Hypertension Father   . Bladder Cancer Sister     Social History   Socioeconomic History  . Marital status: Married    Spouse name: Not on file  . Number of children: Not on file  . Years of education: Not on file  . Highest education level: Not on file  Social Needs  . Financial resource strain: Not on file  . Food insecurity - worry: Not on file  . Food insecurity - inability: Not on file  . Transportation needs - medical: Not on file  . Transportation needs -  non-medical: Not on file  Occupational History  . Not on file  Tobacco Use  . Smoking status: Former Research scientist (life sciences)  . Smokeless tobacco: Never Used  Substance and Sexual Activity  . Alcohol use: No  . Drug use: No  . Sexual activity: Not on file  Other Topics Concern  . Not on file  Social History Narrative   Patient is cared for primarily by his wife at home    REVIEW OF SYSTEMS: Constitutional: Weakness. ENT:  No nose bleeds Pulm: Denies difficulty breathing or productive cough CV:  No palpitations, no chest pain.  Chronic lower extremity edema. GU:  No hematuria, no frequency GI:  Per HPI.  No dysphagia Heme: No excessive bruising or unusual bleeding. Transfusions: None Neuro:  No headaches, no peripheral tingling or numbness Derm:  No itching, no rash or sores.  Endocrine:  No sweats or chills.  No polyuria or dysuria Immunization: Did not inquire Travel:  None beyond local counties in last few months.    PHYSICAL EXAM: Vital signs in last 24 hours: Vitals:   09/23/17 0000 09/23/17 0650  BP: (!) 102/41 (!) 106/43  Pulse: 61   Resp: 14   Temp: 97.7 F (36.5 C) 98.8 F (37.1 C)  SpO2: 97%    Wt Readings from Last 3 Encounters:  09/23/17 100.1 kg (220 lb 10.9 oz)  06/12/17 107.9 kg (237 lb 12.8 oz)  04/02/17 117.6 kg (259 lb 4.2 oz)    General: Hiccupping, chronically and acutely unwell appearing, somewhat confused, obese WM Head: No facial asymmetry or swelling.  No signs of head trauma. Eyes: No scleral icterus.  No conjunctival pallor. Ears: Not hard of hearing. Nose: No discharge or congestion Mouth: Moist, pink, clear oral mucosa. Neck: No JVD, no masses, no thyromegaly. Lungs: No labored breathing or cough. Heart: RRR.  No MRG.  S1, S2 present.  Sinus rhythm in the 60s on telemetry monitor. Abdomen: Obese.  Soft.  Active bowel sounds without tingling or tympanitic bowel sounds.  Not tender.  No masses.  No HSM.  No bruits..   Rectal: Scant amount of liquid  dark stool.  This does not look burgundy currently.  No rectal masses palpable or visible Musc/Skeltl: No joint erythema or contracture deformities. Extremities: Status post amputation of the left pinky toe.  Venous stasis of the lower extremities Neurologic: Although he is alert and oriented appropriately.  He is still somewhat confused and unable to provide precise history.  However he is reliable for simple questions. Skin: Venous stasis dermatitis of the lower extremities bilaterally. Tattoos: None seen     Intake/Output from previous day: 12/12 0701 - 12/13 0700 In: -  Out: 1200 [Urine:1200] Intake/Output this shift: Total I/O In: 483 [P.O.:480; I.V.:3] Out: -   LAB RESULTS: Recent Labs    09/21/17 0223 09/22/17 0136 09/23/17 0444  WBC 15.7* 19.9* 20.7*  HGB 9.3* 9.2* 9.1*  HCT 29.6* 29.3* 29.5*  PLT 241 246 259   BMET Lab Results  Component Value Date   NA 133 (L) 09/23/2017   NA 132 (L) 09/22/2017   NA 132 (L) 09/21/2017   K 4.2 09/23/2017   K 4.0 09/22/2017   K 3.9 09/21/2017   CL 92 (L) 09/23/2017   CL 93 (L) 09/22/2017   CL 92 (L) 09/21/2017   CO2 31 09/23/2017   CO2 29 09/22/2017   CO2 30 09/21/2017   GLUCOSE 142 (H) 09/23/2017   GLUCOSE 118 (H) 09/22/2017   GLUCOSE 119 (H) 09/21/2017   BUN 117 (H) 09/23/2017   BUN 113 (H) 09/22/2017   BUN 110 (H) 09/21/2017   CREATININE 3.24 (H) 09/23/2017   CREATININE 3.53 (H) 09/22/2017   CREATININE 3.55 (H) 09/21/2017   CALCIUM 7.6 (L) 09/23/2017   CALCIUM 7.6 (L) 09/22/2017   CALCIUM 7.8 (L) 09/21/2017   LFT Recent Labs    09/21/17 0226 09/22/17 0136 09/23/17 0444  ALBUMIN 1.7* 1.8* 1.7*   PT/INR Lab Results  Component Value Date   INR 1.44 09/22/2017   INR 3.34 09/21/2017   INR 3.27 09/20/2017   Hepatitis Panel No results for input(s): HEPBSAG, HCVAB, HEPAIGM, HEPBIGM in the last 72 hours. C-Diff No components found for: CDIFF Lipase  No results found for: LIPASE  Drugs of Abuse  No  results found for: LABOPIA, COCAINSCRNUR, LABBENZ, AMPHETMU, THCU, LABBARB   RADIOLOGY STUDIES: No results found.    IMPRESSION:   *   C. difficile positive diarrhea.  Now with burgundy stools.  ? is this lower GI bleeding versus upper GI bleeding.  Patient is certainly at risk for having ischemic colitis on top of the infectious colitis.  He has hiccups which can indicate esophagitis.  Could also have previously undiagnosed gastritis or ulcer disease.  *   Normocytic anemia.  Has not required transfusion with blood products.  *   AKI, renal following.  *   Chronic Coumadin for history of atrial fibrillation.  This is on hold.  Supratherapeutic INR reversed with vitamin K.  Rhythm is currently normal sinus.  *  FTT, debilitation.  Patient spent some weeks in SNF discharging late October 2018.  *   Ischemic cardiomyopathy, cor pulmonale.  CXR of 12/6 shows right greater than left pulmonary opacities likely representing pulmonary edema.  History of same requiring admission in the past.    PLAN:     *  Per Dr Havery Moros:  Add IV Flagyl (ordered), KUB to r/o megacolon (ordered).  Switch to po Pepcid, as PPI relatively contraindicated in C diff.      Azucena Freed  09/23/2017, 12:05 PM Pager: [phone]

## 2017-09-23 NOTE — Clinical Social Work Placement (Signed)
   CLINICAL SOCIAL WORK PLACEMENT  NOTE  Date:  09/23/2017  Patient Details  Name: Christian Garner MRN: [id number] Date of Birth: 06-03-1946  Clinical Social Work is seeking post-discharge placement for this patient at the [place] level of care (*CSW will initial, date and re-position this form in  chart as items are completed):  Yes   Patient/family provided with [facility] Work Department's list of facilities offering this level of care within the geographic area requested by the patient (or if unable, by the patient's family).  Yes   Patient/family informed of their freedom to choose among providers that offer the needed level of care, that participate in Medicare, Medicaid or managed care program needed by the patient, have an available bed and are willing to accept the patient.  Yes   Patient/family informed of [facility]'s ownership interest in [facility] and [facility], as well as of the fact that they are under no obligation to receive care at these facilities.  PASRR submitted to EDS on 09/19/17     PASRR number received on       Existing PASRR number confirmed on 09/19/17     FL2 transmitted to all facilities in geographic area requested by pt/family on 09/19/17     FL2 transmitted to all facilities within larger geographic area on       Patient informed that his/her managed care company has contracts with or will negotiate with certain facilities, including the following:            Patient/family informed of bed offers received.  Patient chooses bed at       Physician recommends and patient chooses bed at      Patient to be transferred to   on  .  Patient to be transferred to facility by       Patient family notified on   of transfer.  Name of family member notified:        PHYSICIAN       Additional Comment:    _______________________________________________ Candie Chroman, LCSW 09/23/2017, 4:10 PM

## 2017-09-23 NOTE — Progress Notes (Addendum)
Physical Therapy Treatment Patient Details Name: Christian Garner MRN: [id number] DOB: [birth year] Today's Date: 09/23/2017    History of Present Illness 71 y.o. male with history of systolic CHF with an EF of 30% on home oxygen, CAD s/p PTCA greater than 1 year ago, a-fib chronically anticoagulated with warfarin, CKD with a baseline creatinine of 1.3 (hemodialysis during admission in January 2018), and DM2 with chronic diabetic foot ulcer who presented in transfer from [name] on 09/16/17 for evaluation of acute on chronic renal failure. Pt found to have sepsis due to c diff.    PT Comments    Pt anxious during session but able to progress from bed into the chair.  Pt refused to advance gait training.  R lateral lean observed with flexed posture over RW during trial.  Plan next session to continue OOB activity and progress to gait training as able.  Plan for SNF remains appropriate as patient continues to require +2 assistance.      Follow Up Recommendations  SNF     Equipment Recommendations  None recommended by PT    Recommendations for Other Services       Precautions / Restrictions Precautions Precautions: Fall Restrictions Weight Bearing Restrictions: No    Mobility  Bed Mobility Overal bed mobility: Needs Assistance Bed Mobility: Supine to Sit     Supine to sit: Mod assist     General bed mobility comments: Cues for initiation and sequencing. Assist for LEs to EOB and trunk elevation to sitting with HHA.  Transfers Overall transfer level: Needs assistance Equipment used: Rolling walker (2 wheeled) Transfers: Sit to/from [facility] Sit to Stand: Mod assist;+2 physical assistance Stand pivot transfers: Mod assist;+2 physical assistance       General transfer comment: to boost up from EOB x3 with max cues for sequencing and safety. Stand pivot to R side with max cues  Ambulation/Gait           Gait velocity interpretation: Below  normal speed for age/gender General Gait Details: Pt with extreme anxiety and refused further gait but able to pivot to R with shuffling steps to turn and sit in recliner.     Stairs            Wheelchair Mobility    Modified Rankin (Stroke Patients Only)       Balance Overall balance assessment: Needs assistance Sitting-balance support: Feet supported Sitting balance-Leahy Scale: Fair     Standing balance support: Bilateral upper extremity supported Standing balance-Leahy Scale: Poor                              Cognition Arousal/Alertness: Awake/alert Behavior During Therapy: WFL for tasks assessed/performed Overall Cognitive Status: No family/caregiver present to determine baseline cognitive functioning                         Following Commands: Follows one step commands with increased time;Follows one step commands inconsistently Safety/Judgement: Decreased awareness of deficits;Decreased awareness of safety            Exercises      General Comments        Pertinent Vitals/Pain Pain Assessment: Faces Faces Pain Scale: Hurts even more Pain Location: buttocks Pain Descriptors / Indicators: Discomfort Pain Intervention(s): Monitored during session;Limited activity within patient's tolerance;Repositioned    Home Living  Prior Function            PT Goals (current goals can now be found in the care plan section) Acute Rehab PT Goals Patient Stated Goal: get some rest Potential to Achieve Goals: Good Progress towards PT goals: Not progressing toward goals - comment    Frequency    Min 2X/week      PT Plan Current plan remains appropriate    Co-evaluation PT/OT/SLP Co-Evaluation/Treatment: Yes Reason for Co-Treatment: Complexity of the patient's impairments (multi-system involvement);For patient/therapist safety PT goals addressed during session: Mobility/safety with mobility OT goals  addressed during session: ADL's and self-care      AM-PAC PT "6 Clicks" Daily Activity  Outcome Measure  Difficulty turning over in bed (including adjusting bedclothes, sheets and blankets)?: Unable Difficulty moving from lying on back to sitting on the side of the bed? : Unable Difficulty sitting down on and standing up from a chair with arms (e.g., wheelchair, bedside commode, etc,.)?: Unable Help needed moving to and from a bed to chair (including a wheelchair)?: A Lot Help needed walking in hospital room?: Total Help needed climbing 3-5 steps with a railing? : Total 6 Click Score: 7    End of Session Equipment Utilized During Treatment: Gait belt;Oxygen Activity Tolerance: Patient tolerated treatment well;Patient limited by fatigue Patient left: in bed;with call bell/phone within reach;with bed alarm set Nurse Communication: Mobility status PT Visit Diagnosis: Unsteadiness on feet (R26.81);Other abnormalities of gait and mobility (R26.89);Muscle weakness (generalized) (M62.81)     Time: 2536-6440 PT Time Calculation (min) (ACUTE ONLY): 23 min  Charges:  $Therapeutic Activity: 8-22 mins                    G Codes:       Governor Rooks, PTA pager [phone]    Cristela Blue 09/23/2017, 5:53 PM

## 2017-09-24 DIAGNOSIS — D631 Anemia in chronic kidney disease: Secondary | ICD-10-CM

## 2017-09-24 DIAGNOSIS — I2510 Atherosclerotic heart disease of native coronary artery without angina pectoris: Secondary | ICD-10-CM

## 2017-09-24 DIAGNOSIS — L97529 Non-pressure chronic ulcer of other part of left foot with unspecified severity: Secondary | ICD-10-CM

## 2017-09-24 DIAGNOSIS — I480 Paroxysmal atrial fibrillation: Secondary | ICD-10-CM

## 2017-09-24 DIAGNOSIS — I5043 Acute on chronic combined systolic (congestive) and diastolic (congestive) heart failure: Secondary | ICD-10-CM

## 2017-09-24 DIAGNOSIS — N189 Chronic kidney disease, unspecified: Secondary | ICD-10-CM

## 2017-09-24 DIAGNOSIS — A0472 Enterocolitis due to Clostridium difficile, not specified as recurrent: Secondary | ICD-10-CM

## 2017-09-24 DIAGNOSIS — K625 Hemorrhage of anus and rectum: Secondary | ICD-10-CM

## 2017-09-24 DIAGNOSIS — A419 Sepsis, unspecified organism: Secondary | ICD-10-CM

## 2017-09-24 DIAGNOSIS — E11621 Type 2 diabetes mellitus with foot ulcer: Secondary | ICD-10-CM

## 2017-09-24 DIAGNOSIS — A0471 Enterocolitis due to Clostridium difficile, recurrent: Secondary | ICD-10-CM

## 2017-09-24 DIAGNOSIS — L98499 Non-pressure chronic ulcer of skin of other sites with unspecified severity: Secondary | ICD-10-CM

## 2017-09-24 DIAGNOSIS — D62 Acute posthemorrhagic anemia: Secondary | ICD-10-CM

## 2017-09-24 DIAGNOSIS — N179 Acute kidney failure, unspecified: Secondary | ICD-10-CM

## 2017-09-24 DIAGNOSIS — N183 Chronic kidney disease, stage 3 (moderate): Secondary | ICD-10-CM

## 2017-09-24 LAB — BASIC METABOLIC PANEL
Anion gap: 10 (ref 5–15)
BUN: 130 mg/dL — ABNORMAL HIGH (ref 6–20)
CHLORIDE: 94 mmol/L — AB (ref 101–111)
CO2: 28 mmol/L (ref 22–32)
CREATININE: 3.79 mg/dL — AB (ref 0.61–1.24)
Calcium: 7.4 mg/dL — ABNORMAL LOW (ref 8.9–10.3)
GFR calc non Af Amer: 15 mL/min — ABNORMAL LOW (ref >60)
GFR, EST AFRICAN AMERICAN: 17 mL/min — AB (ref >60)
GLUCOSE: 158 mg/dL — AB (ref 65–99)
Potassium: 3.9 mmol/L (ref 3.5–5.1)
Sodium: 132 mmol/L — ABNORMAL LOW (ref 135–145)

## 2017-09-24 LAB — CBC WITH DIFFERENTIAL/PLATELET
Basophils Absolute: 0 10*3/uL (ref 0.0–0.1)
Basophils Relative: 0 %
EOS ABS: 0.1 10*3/uL (ref 0.0–0.7)
Eosinophils Relative: 0 %
HEMATOCRIT: 25.7 % — AB (ref 39.0–52.0)
HEMOGLOBIN: 7.9 g/dL — AB (ref 13.0–17.0)
LYMPHS ABS: 1.5 10*3/uL (ref 0.7–4.0)
Lymphocytes Relative: 8 %
MCH: 29.2 pg (ref 26.0–34.0)
MCHC: 30.7 g/dL (ref 30.0–36.0)
MCV: 94.8 fL (ref 78.0–100.0)
MONO ABS: 1.3 10*3/uL — AB (ref 0.1–1.0)
MONOS PCT: 7 %
NEUTROS PCT: 85 %
Neutro Abs: 15.9 10*3/uL — ABNORMAL HIGH (ref 1.7–7.7)
Platelets: 249 10*3/uL (ref 150–400)
RBC: 2.71 MIL/uL — ABNORMAL LOW (ref 4.22–5.81)
RDW: 17.2 % — AB (ref 11.5–15.5)
WBC: 18.8 10*3/uL — ABNORMAL HIGH (ref 4.0–10.5)

## 2017-09-24 LAB — CBC
HEMATOCRIT: 26 % — AB (ref 39.0–52.0)
HEMOGLOBIN: 8.1 g/dL — AB (ref 13.0–17.0)
MCH: 29.3 pg (ref 26.0–34.0)
MCHC: 31.2 g/dL (ref 30.0–36.0)
MCV: 94.2 fL (ref 78.0–100.0)
Platelets: 248 10*3/uL (ref 150–400)
RBC: 2.76 MIL/uL — AB (ref 4.22–5.81)
RDW: 17.1 % — ABNORMAL HIGH (ref 11.5–15.5)
WBC: 19.4 10*3/uL — ABNORMAL HIGH (ref 4.0–10.5)

## 2017-09-24 LAB — RENAL FUNCTION PANEL
ANION GAP: 9 (ref 5–15)
Albumin: 1.6 g/dL — ABNORMAL LOW (ref 3.5–5.0)
BUN: 123 mg/dL — ABNORMAL HIGH (ref 6–20)
CHLORIDE: 93 mmol/L — AB (ref 101–111)
CO2: 29 mmol/L (ref 22–32)
Calcium: 7.3 mg/dL — ABNORMAL LOW (ref 8.9–10.3)
Creatinine, Ser: 3.59 mg/dL — ABNORMAL HIGH (ref 0.61–1.24)
GFR calc non Af Amer: 16 mL/min — ABNORMAL LOW (ref >60)
GFR, EST AFRICAN AMERICAN: 18 mL/min — AB (ref >60)
GLUCOSE: 230 mg/dL — AB (ref 65–99)
Phosphorus: 4.7 mg/dL — ABNORMAL HIGH (ref 2.5–4.6)
Potassium: 4.1 mmol/L (ref 3.5–5.1)
SODIUM: 131 mmol/L — AB (ref 135–145)

## 2017-09-24 LAB — GLUCOSE, CAPILLARY
GLUCOSE-CAPILLARY: 114 mg/dL — AB (ref 65–99)
GLUCOSE-CAPILLARY: 157 mg/dL — AB (ref 65–99)
Glucose-Capillary: 157 mg/dL — ABNORMAL HIGH (ref 65–99)
Glucose-Capillary: 179 mg/dL — ABNORMAL HIGH (ref 65–99)

## 2017-09-24 LAB — MAGNESIUM
MAGNESIUM: 2.7 mg/dL — AB (ref 1.7–2.4)
Magnesium: 2.4 mg/dL (ref 1.7–2.4)

## 2017-09-24 MED ORDER — ALBUMIN HUMAN 5 % IV SOLN
75.0000 g | Freq: Once | INTRAVENOUS | Status: DC
  Filled 2017-09-24: qty 1500

## 2017-09-24 MED ORDER — ALBUMIN HUMAN 25 % IV SOLN
50.0000 g | Freq: Once | INTRAVENOUS | Status: AC
  Administered 2017-09-24: 50 g via INTRAVENOUS
  Filled 2017-09-24: qty 200

## 2017-09-24 MED ORDER — SODIUM CHLORIDE 0.9 % IV BOLUS (SEPSIS)
500.0000 mL | Freq: Once | INTRAVENOUS | Status: AC
  Administered 2017-09-24: 500 mL via INTRAVENOUS

## 2017-09-24 NOTE — Progress Notes (Signed)
Paged MD regarding patient's hypotension and drowsiness. Received orders for 541mL bolus and IV albumin. STAT labs placed. Will continue to monitor.

## 2017-09-24 NOTE — Consult Note (Signed)
       [facility]    Date of Admission:  09/16/2017  Total days of antibiotics: po vanco 12-6, IV flagyl 12-14               Reason for Consult: C diff    Referring Provider: Woods   Assessment: C diff, severe, recurrent ATN secondary to sepsis DM2 with L foot ulcer Sacral decubitus stage II PAF on heparin   Plan: 1. Would continue po vanco and IV flagyl for 14 days 2. Then begin prolonged oral vanco taper, see below.  3. If stool transplant is considered, this is done out pt and can be arranged with Dr Snider 4. Consider WOC eval of his L foot.   Week 1-2 vancomycin 500mg po qid for 14 days Week 3 vancomycin po 125mg bid for 7 days Week 4 vancomycin po 125mg qday for 7 days Week 5-9 "                    "   125mg qoday for 28 days Week 10-14 "                             "  q 3 days for 28 days  Comment- He appears to be improving- his WBC is coming down, he is afebrile.  His course is complicated by his delicate fluid balance, CKD and CHF.  We do not do stool transplants in inpatients, acutely.   ID available as needed over the weekend.   Thank you so much for this interesting consult,  Principal Problem:   Acute kidney injury superimposed on chronic kidney disease (HCC) Active Problems:   Acute on chronic combined systolic and diastolic CHF (congestive heart failure) (HCC)   AF (paroxysmal atrial fibrillation) (HCC)   Pressure injury of skin   Hyponatremia   CAD (coronary artery disease)   C. difficile colitis   Warfarin anticoagulation   Type 2 diabetes mellitus with left diabetic foot ulcer (HCC)   Anemia due to chronic kidney disease   CKD (chronic kidney disease), stage III (HCC)   . amiodarone  200 mg Oral BID  . atorvastatin  40 mg Oral QHS  . famotidine  20 mg Oral Daily  . feeding supplement (NEPRO CARB STEADY)  237 mL Oral TID BM  . feeding supplement (PRO-STAT SUGAR FREE 64)  30 mL Oral TID BM  . ferrous sulfate   325 mg Oral Q breakfast  . hydrocortisone  25 mg Rectal BID  . insulin aspart  0-9 Units Subcutaneous TID WC  . mouth rinse  15 mL Mouth Rinse BID  . sodium chloride flush  3 mL Intravenous Q12H  . traZODone  50 mg Oral QHS  . vancomycin  500 mg Oral Q6H  . vitamin B-12  1,000 mcg Oral Daily    HPI: Christian Garner is a 71 y.o. male with hx of DM2 for roughly 8 years, CKD 3, CHF (EF 30-35%), CAD (previous PCI), PAF.  He previously had C diff October 19th that resolved quickly. Family was unable to remember what he was treated with.   Adm to [facility] 12-3 with C diff colitis, sepsis and ARF (Cr 3.3). He was also coagulopathic (INR 5.8 --> 8.4). He was started on po vancomycin, given vitamin K, remained hypotensive. His Cr worsened and was transferred to [facility] on 12-6. His WBC was 29.6.    In hospital his INR again climbed and he again received Vitamin K.  He developed BRBPR on 12-10. This was felt to be due to irritation from his flexiseal. He was eval by GI due to downward trend of his hgb. They recommended po vanco and IV flagyl for 10 days.   WBC has now improved to 19.4.  Cr 3.59  Review of Systems: Review of Systems  Unable to perform ROS: Mental status change  Constitutional: Positive for chills and fever.  Respiratory: Negative for cough and shortness of breath.   Cardiovascular: Negative for chest pain.  Gastrointestinal: Positive for blood in stool and diarrhea. Negative for constipation, melena, nausea and vomiting.    Has had 2 loose BM today. No further blood in stool.   Please see HPI. All other systems reviewed and negative.   Past Medical History:  Diagnosis Date  . CAD (coronary artery disease)   . Cellulitis and abscess of lower extremity   . CHF (congestive heart failure) (HCC)    EF 30%  . Chronic kidney disease, stage III (moderate) (HCC)   . DM (diabetes mellitus) (HCC)     Social History   Tobacco Use  . Smoking status: Former Smoker  .  Smokeless tobacco: Never Used  Substance Use Topics  . Alcohol use: No  . Drug use: No    Family History  Problem Relation Age of Onset  . Hypertension Mother   . Diabetes Mother   . Bone cancer Mother   . Hypertension Father   . Bladder Cancer Sister      Medications:  Scheduled: . amiodarone  200 mg Oral BID  . atorvastatin  40 mg Oral QHS  . famotidine  20 mg Oral Daily  . feeding supplement (NEPRO CARB STEADY)  237 mL Oral TID BM  . feeding supplement (PRO-STAT SUGAR FREE 64)  30 mL Oral TID BM  . ferrous sulfate  325 mg Oral Q breakfast  . hydrocortisone  25 mg Rectal BID  . insulin aspart  0-9 Units Subcutaneous TID WC  . mouth rinse  15 mL Mouth Rinse BID  . sodium chloride flush  3 mL Intravenous Q12H  . traZODone  50 mg Oral QHS  . vancomycin  500 mg Oral Q6H  . vitamin B-12  1,000 mcg Oral Daily    Abtx:  Anti-infectives (From admission, onward)   Start     Dose/Rate Route Frequency Ordered Stop   09/24/17 0030  metroNIDAZOLE (FLAGYL) IVPB 500 mg     500 mg 100 mL/hr over 60 Minutes Intravenous Every 8 hours 09/23/17 1859     09/23/17 1600  metroNIDAZOLE (FLAGYL) IVPB 500 mg  Status:  Discontinued     500 mg 100 mL/hr over 60 Minutes Intravenous Every 6 hours 09/23/17 1517 09/23/17 1859   09/16/17 1200  vancomycin (VANCOCIN) 50 mg/mL oral solution 500 mg     500 mg Oral Every 6 hours 09/16/17 0937          OBJECTIVE: Blood pressure (!) 102/46, pulse 60, temperature 98.5 F (36.9 C), temperature source Axillary, resp. rate 12, height 6' (1.829 m), weight 101.1 kg (222 lb 14.2 oz), SpO2 99 %.  Physical Exam  Constitutional: He appears not lethargic, to not be writhing in pain and not jaundiced.  Non-toxic appearance. He does not have a sickly appearance. No distress.  Eyes: EOM are normal. Pupils are equal, round, and reactive to light.  Neck: Neck supple.  Cardiovascular: Normal rate, regular rhythm   and normal heart sounds.  Pulmonary/Chest: Effort  normal. He has rales.  Abdominal: Soft. Bowel sounds are normal. He exhibits distension. There is no tenderness. There is no rebound and no guarding.  Musculoskeletal: He exhibits no edema.       Feet:  Lymphadenopathy:    He has no cervical adenopathy.  Neurological: He appears not lethargic.  Skin: He is not diaphoretic.    Lab Results Results for orders placed or performed during the hospital encounter of 09/16/17 (from the past 48 hour(s))  Glucose, capillary     Status: Abnormal   Collection Time: 09/23/17 12:14 AM  Result Value Ref Range   Glucose-Capillary 142 (H) 65 - 99 mg/dL  CBC     Status: Abnormal   Collection Time: 09/23/17  4:44 AM  Result Value Ref Range   WBC 20.7 (H) 4.0 - 10.5 K/uL   RBC 3.14 (L) 4.22 - 5.81 MIL/uL   Hemoglobin 9.1 (L) 13.0 - 17.0 g/dL   HCT 29.5 (L) 39.0 - 52.0 %   MCV 93.9 78.0 - 100.0 fL   MCH 29.0 26.0 - 34.0 pg   MCHC 30.8 30.0 - 36.0 g/dL   RDW 17.3 (H) 11.5 - 15.5 %   Platelets 259 150 - 400 K/uL  Magnesium     Status: Abnormal   Collection Time: 09/23/17  4:44 AM  Result Value Ref Range   Magnesium 2.6 (H) 1.7 - 2.4 mg/dL  Renal function panel     Status: Abnormal   Collection Time: 09/23/17  4:44 AM  Result Value Ref Range   Sodium 133 (L) 135 - 145 mmol/L   Potassium 4.2 3.5 - 5.1 mmol/L   Chloride 92 (L) 101 - 111 mmol/L   CO2 31 22 - 32 mmol/L   Glucose, Bld 142 (H) 65 - 99 mg/dL   BUN 117 (H) 6 - 20 mg/dL   Creatinine, Ser 3.24 (H) 0.61 - 1.24 mg/dL   Calcium 7.6 (L) 8.9 - 10.3 mg/dL   Phosphorus 5.5 (H) 2.5 - 4.6 mg/dL   Albumin 1.7 (L) 3.5 - 5.0 g/dL   GFR calc non Af Amer 18 (L) >60 mL/min   GFR calc Af Amer 20 (L) >60 mL/min    Comment: (NOTE) The eGFR has been calculated using the CKD EPI equation. This calculation has not been validated in all clinical situations. eGFR's persistently <60 mL/min signify possible Chronic Kidney Disease.    Anion gap 10 5 - 15  Glucose, capillary     Status: Abnormal    Collection Time: 09/23/17  8:16 AM  Result Value Ref Range   Glucose-Capillary 131 (H) 65 - 99 mg/dL   Comment 1 Notify RN    Comment 2 Document in Chart   Glucose, capillary     Status: Abnormal   Collection Time: 09/23/17 12:17 PM  Result Value Ref Range   Glucose-Capillary 173 (H) 65 - 99 mg/dL   Comment 1 Notify RN    Comment 2 Document in Chart   Glucose, capillary     Status: Abnormal   Collection Time: 09/23/17  5:51 PM  Result Value Ref Range   Glucose-Capillary 219 (H) 65 - 99 mg/dL   Comment 1 Notify RN    Comment 2 Document in Chart   Glucose, capillary     Status: Abnormal   Collection Time: 09/23/17  9:00 PM  Result Value Ref Range   Glucose-Capillary 161 (H) 65 - 99 mg/dL  Renal function panel       Status: Abnormal   Collection Time: 09/24/17  3:20 AM  Result Value Ref Range   Sodium 131 (L) 135 - 145 mmol/L   Potassium 4.1 3.5 - 5.1 mmol/L   Chloride 93 (L) 101 - 111 mmol/L   CO2 29 22 - 32 mmol/L   Glucose, Bld 230 (H) 65 - 99 mg/dL   BUN 123 (H) 6 - 20 mg/dL   Creatinine, Ser 3.59 (H) 0.61 - 1.24 mg/dL   Calcium 7.3 (L) 8.9 - 10.3 mg/dL   Phosphorus 4.7 (H) 2.5 - 4.6 mg/dL   Albumin 1.6 (L) 3.5 - 5.0 g/dL   GFR calc non Af Amer 16 (L) >60 mL/min   GFR calc Af Amer 18 (L) >60 mL/min    Comment: (NOTE) The eGFR has been calculated using the CKD EPI equation. This calculation has not been validated in all clinical situations. eGFR's persistently <60 mL/min signify possible Chronic Kidney Disease.    Anion gap 9 5 - 15  CBC     Status: Abnormal   Collection Time: 09/24/17  3:20 AM  Result Value Ref Range   WBC 19.4 (H) 4.0 - 10.5 K/uL   RBC 2.76 (L) 4.22 - 5.81 MIL/uL   Hemoglobin 8.1 (L) 13.0 - 17.0 g/dL   HCT 26.0 (L) 39.0 - 52.0 %   MCV 94.2 78.0 - 100.0 fL   MCH 29.3 26.0 - 34.0 pg   MCHC 31.2 30.0 - 36.0 g/dL   RDW 17.1 (H) 11.5 - 15.5 %   Platelets 248 150 - 400 K/uL  Magnesium     Status: None   Collection Time: 09/24/17  3:20 AM  Result  Value Ref Range   Magnesium 2.4 1.7 - 2.4 mg/dL  Glucose, capillary     Status: Abnormal   Collection Time: 09/24/17  8:16 AM  Result Value Ref Range   Glucose-Capillary 157 (H) 65 - 99 mg/dL   Comment 1 Notify RN    Comment 2 Document in Chart   Glucose, capillary     Status: Abnormal   Collection Time: 09/24/17 12:25 PM  Result Value Ref Range   Glucose-Capillary 114 (H) 65 - 99 mg/dL   Comment 1 Notify RN    Comment 2 Document in Chart       Component Value Date/Time   SDES PLEURAL RIGHT 06/09/2017 1625   SPECREQUEST NONE 06/09/2017 1625   CULT NO GROWTH 3 DAYS 06/09/2017 1625   REPTSTATUS 06/13/2017 FINAL 06/09/2017 1625   Dg Abd 1 View  Result Date: 09/23/2017 CLINICAL DATA:  Celsius difficile colitis. Rule out toxic megacolon. Bloody diarrhea with little improvement. EXAM: ABDOMEN - 1 VIEW COMPARISON:  CT 06/25/2017, radiographs 12/27/2016 FINDINGS: The colon is not significantly dilated and no loss of haustral markings is noted, ruling out toxic megacolon. There is some mucosal thickening and prominence of the haustral folds of the colon in keeping with patient's known Celsius difficile colitis. No significant small bowel dilatation. There is no free air. IMPRESSION: Submucosal edema and thickening of the haustral markings of the colon consistent with known colitis. No findings of toxic megacolon. Electronically Signed   By: David  Kwon M.D.   On: 09/23/2017 19:05   Recent Results (from the past 240 hour(s))  MRSA PCR Screening     Status: Abnormal   Collection Time: 09/16/17 10:38 AM  Result Value Ref Range Status   MRSA by PCR POSITIVE (A) NEGATIVE Final    Comment:        The   GeneXpert MRSA Assay (FDA approved for NASAL specimens only), is one component of a comprehensive MRSA colonization surveillance program. It is not intended to diagnose MRSA infection nor to guide or monitor treatment for MRSA infections. RESULT CALLED TO, READ BACK BY AND VERIFIED WITH: H.  Deaton RN 12:20 09/16/17 ([email or web address])   C difficile quick scan w PCR reflex     Status: Abnormal   Collection Time: 09/16/17  1:24 PM  Result Value Ref Range Status   C Diff antigen POSITIVE (A) NEGATIVE Final   C Diff toxin NEGATIVE NEGATIVE Final   C Diff interpretation Results are indeterminate. See PCR results.  Final  Clostridium Difficile by PCR     Status: Abnormal   Collection Time: 09/16/17  1:24 PM  Result Value Ref Range Status   Toxigenic C. Difficile by PCR POSITIVE (A) NEGATIVE Final    Comment: Positive for toxigenic C. difficile with little to no toxin production. Only treat if clinical presentation suggests symptomatic illness.    Microbiology: Recent Results (from the past 240 hour(s))  MRSA PCR Screening     Status: Abnormal   Collection Time: 09/16/17 10:38 AM  Result Value Ref Range Status   MRSA by PCR POSITIVE (A) NEGATIVE Final    Comment:        The GeneXpert MRSA Assay (FDA approved for NASAL specimens only), is one component of a comprehensive MRSA colonization surveillance program. It is not intended to diagnose MRSA infection nor to guide or monitor treatment for MRSA infections. RESULT CALLED TO, READ BACK BY AND VERIFIED WITH: H. Deaton RN 12:20 09/16/17 ([email or web address])   C difficile quick scan w PCR reflex     Status: Abnormal   Collection Time: 09/16/17  1:24 PM  Result Value Ref Range Status   C Diff antigen POSITIVE (A) NEGATIVE Final   C Diff toxin NEGATIVE NEGATIVE Final   C Diff interpretation Results are indeterminate. See PCR results.  Final  Clostridium Difficile by PCR     Status: Abnormal   Collection Time: 09/16/17  1:24 PM  Result Value Ref Range Status   Toxigenic C. Difficile by PCR POSITIVE (A) NEGATIVE Final    Comment: Positive for toxigenic C. difficile with little to no toxin production. Only treat if clinical presentation suggests symptomatic illness.    Radiographs and labs were personally reviewed by me.   Bobby Rumpf,  MD [facility] [phone] 09/24/2017, 2:55 PM

## 2017-09-24 NOTE — Progress Notes (Signed)
PROGRESS NOTE    Christian Garner  OZH:[id number] DOB: 11/05/1945 DOA: 09/16/2017 PCP: [name], MD   Brief Narrative:  71 y.o. WM PMHx Systolic CHF ( EF of 62%),XBM s/p PTCA greater than 1 year ago, Atrial Fibrillation chronically anticoagulated with warfarin, Chronic Respiratory Failure with Hypoxia ( on home oxygen),  CKD( baseline Cr  1.3 ;HD during admission in January 2018), and DM Type 2 controlled with complication (chronic diabetic foot ulcer)  Who presented in transfer from [facility] for evaluation of acute on chronic renal failure.   Patient presented to [facility] on December 3 acutely ill with evidence of sepsis.  He was found to have C. difficile colitis.  He was in acute renal failure upon presentation and his renal function failed to improve.  Consultation was sought with Dr. Justin Mend per telephone and recommendation was made for transfer to [facility].  On initial presentation his INR was markedly elevated at 5.8 and increased to 8.4 on the day after admission.  At that point he received treatment w/ vitamin K.  On the day prior to transfer INR was down to 2.5.       Subjective: 12/14 extremely sleepy but arousable. Follows commands. Negative CP, negative SOB, negative abdominal pain. Positive anorexia. States believes diarrhea has not improved.    Assessment & Plan:   Principal Problem:   Acute kidney injury superimposed on chronic kidney disease ([place]) Active Problems:   Acute on chronic combined systolic and diastolic CHF (congestive heart failure) (HCC)   AF (paroxysmal atrial fibrillation) (HCC)   Pressure injury of skin   Hyponatremia   CAD (coronary artery disease)   C. difficile colitis   Warfarin anticoagulation   Type 2 diabetes mellitus with left diabetic foot ulcer (HCC)   Anemia due to chronic kidney disease   CKD (chronic kidney disease), stage III ([place])   Acute on CKD stage IIII(Baseline Cr 1.47-2.31) -Nephrology on board. Per EMR felt to  be ATN secondary to sepsis  Recent Labs  Lab 09/19/17 0309 09/20/17 0215 09/21/17 0226 09/22/17 0136 09/23/17 0444 09/24/17 0320  CREATININE 3.90* 3.69* 3.55* 3.53* 3.24* 3.59*  -BUN/Cr now arising with continued diuresis. Hold Lasix believe overdiuresised. BUN/CR ratio 34/1    Acute on Chronic combined Systolic and Diastolic CHF   -Strict in and out since admission -6.4 L -Daily weight Filed Weights   09/22/17 0500 09/23/17 0500 09/24/17 0332  Weight: 224 lb 13.9 oz (102 kg) 220 lb 10.9 oz (100.1 kg) 222 lb 14.2 oz (101.1 kg)  -Amiodarone 200 mg BID -12/14 discontinue Lasix 80 mg BID -Transfuse for hemoglobin <8  Chronic Paroxysmal Atrial fibrillation with RVR -Currently in NSR -See CHF -Warfarin per pharmacy Recent Labs  Lab 09/18/17 0252 09/19/17 0309 09/20/17 0215 09/21/17 0223 09/22/17 0136  INR 4.34* 5.78* 3.27 3.34 1.44  -Currently subtherapeutic after receiving 2.5 mg + 5 mg vitamin K secondary to being supratherapeutic. Received secondary to GI bleed. -Continue to hold anticoagulation with continued diarrhea/melena Continue to hold anticoagulation patient with continued diarrhea/melena  CAD -See CHF  Upper GI bleed   -Witnessed melanic diarrhea stools today: Hemoglobin slowly trending down. Recent Labs  Lab 09/19/17 0309 09/20/17 0215 09/21/17 0223 09/22/17 0136 09/23/17 0444 09/24/17 0320  HGB 9.5* 9.7* 9.3* 9.2* 9.1* 8.1*      Mild Acute Delirium vs Dementia   -After patient's baseline -BUN continues to climb. Most likely uremic component, await nephrology recommendation watchful waiting vs CRRT? .  C. difficile colitis  -  Consulted GI and recommended combination therapy PO Vancomycin 10 days + Metronidazole IV.   -12/14 consulted GI. And they placed patient on dual therapy for C. difficile colitis. In addition requested that I contact ID for evaluation for stool transplant. -Will await ID recommendations.    Diabetes type 2 Controlled with  Complication/LEFT Diabetic Foot Ulcer -12/6 Hemoglobin A1c= 6.1 -Sensitive SSI   Multiple cutaneous injuries -LEFT diabetic foot ulcer/Surgical site; LEFT lateral 5th toe amputation  -Venous stasis: RIGHT LOWER EXTREMITY x 2 ruptured bulla, most likely related to edema -Stage 2 Pressure injury upper GLUTEAL CLEFT  with moisture component, patient incontinent of stool  -Wound care has been consulted  Acute blood loss anemia/Anemia of Chronic Kidney disease/Acute illness   -Transfuse for hemoglobin<8 -Anemia panel consistent with anemia of chronic disease      DVT prophylaxis: SCD Code Status: Full Family Communication: None Disposition Plan: TBD   Consultants:  Nephrology    Procedures/Significant Events:  12/3 admit to [facility] w/ sepsis due to C. Diff 12/6 transfer to [facility]  12/7 flexiseal inserted (29 day limit) 12/9 dosed w/ vitamin K 12/10 developed evidence of maroon stools - flexiseal removed     I have personally reviewed and interpreted all radiology studies and my findings are as above.  VENTILATOR SETTINGS:    Cultures 12/6 MRSA by PCR positive 12/6 stool C. difficile antigen positive, C. difficile toxin positive    Antimicrobials: Anti-infectives (From admission, onward)   Start     Dose/Rate Stop   09/24/17 0030  metroNIDAZOLE (FLAGYL) IVPB 500 mg     500 mg 100 mL/hr over 60 Minutes     09/23/17 1600  metroNIDAZOLE (FLAGYL) IVPB 500 mg  Status:  Discontinued     500 mg 100 mL/hr over 60 Minutes 09/23/17 1859   09/16/17 1200  vancomycin (VANCOCIN) 50 mg/mL oral solution 500 mg     500 mg         Devices    LINES / TUBES:      Continuous Infusions: . metronidazole Stopped (09/24/17 0253)     Objective: Vitals:   09/23/17 1629 09/23/17 2000 09/24/17 0030 09/24/17 0332  BP: (!) 106/47 (!) 103/42 (!) 113/50 (!) 121/45  Pulse:   64 68  Resp: 18 16 12 15   Temp:  (!) 97 F (36.1 C) (!) 97.5 F (36.4 C) 97.9 F (36.6  C)  TempSrc:  Axillary Axillary Oral  SpO2:  100% 97% 99%  Weight:    222 lb 14.2 oz (101.1 kg)  Height:        Intake/Output Summary (Last 24 hours) at 09/24/2017 6568 Last data filed at 09/24/2017 1275 Gross per 24 hour  Intake 1166 ml  Output 1000 ml  Net 166 ml   Filed Weights   09/22/17 0500 09/23/17 0500 09/24/17 0332  Weight: 224 lb 13.9 oz (102 kg) 220 lb 10.9 oz (100.1 kg) 222 lb 14.2 oz (101.1 kg)    Physical Exam:  General: Sleepy but arousable, follows commands, No acute respiratory distress Neck:  Negative scars, masses, torticollis, lymphadenopathy, JVD Lungs: Clear to auscultation bilaterally without wheezes or crackles Cardiovascular: Regular rate and rhythm without murmur gallop or rub normal S1 and S2 Abdomen: negative abdominal pain, nondistended, positive soft, bowel sounds, no rebound, no ascites, no appreciable mass Extremities: No significant cyanosis, clubbing, or edema bilateral lower extremities Skin: Negative rashes, lesions, ulcers Psychiatric:  Negative depression, negative anxiety, negative fatigue, negative mania  Central nervous system:  Cranial  nerves II through XII intact, tongue/uvula midline, all extremities muscle strength 5/5, sensation intact throughout,negative dysarthria, negative expressive aphasia, negative receptive aphasia. .     Data Reviewed: Care during the described time interval was provided by me .  I have reviewed this patient's available data, including medical history, events of note, physical examination, and all test results as part of my evaluation.   CBC: Recent Labs  Lab 09/20/17 0215 09/21/17 0223 09/22/17 0136 09/23/17 0444 09/24/17 0320  WBC 15.1* 15.7* 19.9* 20.7* 19.4*  HGB 9.7* 9.3* 9.2* 9.1* 8.1*  HCT 31.0* 29.6* 29.3* 29.5* 26.0*  MCV 93.1 93.1 93.6 93.9 94.2  PLT 240 241 246 259 683   Basic Metabolic Panel: Recent Labs  Lab 09/20/17 0215 09/21/17 0226 09/22/17 0136 09/23/17 0444 09/24/17 0320   NA 133* 132* 132* 133* 131*  K 3.9 3.9 4.0 4.2 4.1  CL 92* 92* 93* 92* 93*  CO2 30 30 29 31 29   GLUCOSE 161* 119* 118* 142* 230*  BUN 106* 110* 113* 117* 123*  CREATININE 3.69* 3.55* 3.53* 3.24* 3.59*  CALCIUM 7.7* 7.8* 7.6* 7.6* 7.3*  MG  --   --   --  2.6* 2.4  PHOS 6.0* 5.4* 5.2* 5.5* 4.7*   GFR: Estimated Creatinine Clearance: 22.9 mL/min (A) (by C-G formula based on SCr of 3.59 mg/dL (H)). Liver Function Tests: Recent Labs  Lab 09/18/17 0316  09/20/17 0215 09/21/17 0226 09/22/17 0136 09/23/17 0444 09/24/17 0320  AST 23  --   --   --   --   --   --   ALT 12*  --   --   --   --   --   --   ALKPHOS 160*  --   --   --   --   --   --   BILITOT 1.0  --   --   --   --   --   --   PROT 5.4*  --   --   --   --   --   --   ALBUMIN 2.0*   < > 1.7* 1.7* 1.8* 1.7* 1.6*   < > = values in this interval not displayed.   No results for input(s): LIPASE, AMYLASE in the last 168 hours. No results for input(s): AMMONIA in the last 168 hours. Coagulation Profile: Recent Labs  Lab 09/18/17 0252 09/19/17 0309 09/20/17 0215 09/21/17 0223 09/22/17 0136  INR 4.34* 5.78* 3.27 3.34 1.44   Cardiac Enzymes: No results for input(s): CKTOTAL, CKMB, CKMBINDEX, TROPONINI in the last 168 hours. BNP (last 3 results) No results for input(s): PROBNP in the last 8760 hours. HbA1C: No results for input(s): HGBA1C in the last 72 hours. CBG: Recent Labs  Lab 09/23/17 0816 09/23/17 1217 09/23/17 1751 09/23/17 2100 09/24/17 0816  GLUCAP 131* 173* 219* 161* 157*   Lipid Profile: No results for input(s): CHOL, HDL, LDLCALC, TRIG, CHOLHDL, LDLDIRECT in the last 72 hours. Thyroid Function Tests: No results for input(s): TSH, T4TOTAL, FREET4, T3FREE, THYROIDAB in the last 72 hours. Anemia Panel: No results for input(s): VITAMINB12, FOLATE, FERRITIN, TIBC, IRON, RETICCTPCT in the last 72 hours. Urine analysis:    Component Value Date/Time   COLORURINE YELLOW 09/16/2017 1753   APPEARANCEUR  HAZY (A) 09/16/2017 1753   LABSPEC >1.030 (H) 09/16/2017 1753   PHURINE 5.0 09/16/2017 1753   GLUCOSEU NEGATIVE 09/16/2017 1753   HGBUR MODERATE (A) 09/16/2017 1753   BILIRUBINUR NEGATIVE 09/16/2017 1753   KETONESUR  NEGATIVE 09/16/2017 1753   PROTEINUR NEGATIVE 09/16/2017 1753   NITRITE NEGATIVE 09/16/2017 1753   LEUKOCYTESUR NEGATIVE 09/16/2017 1753   Sepsis Labs: @LABRCNTIP (procalcitonin:4,lacticidven:4)  ) Recent Results (from the past 240 hour(s))  MRSA PCR Screening     Status: Abnormal   Collection Time: 09/16/17 10:38 AM  Result Value Ref Range Status   MRSA by PCR POSITIVE (A) NEGATIVE Final    Comment:        The GeneXpert MRSA Assay (FDA approved for NASAL specimens only), is one component of a comprehensive MRSA colonization surveillance program. It is not intended to diagnose MRSA infection nor to guide or monitor treatment for MRSA infections. RESULT CALLED TO, READ BACK BY AND VERIFIED WITH: H. Deaton RN 12:20 09/16/17 ([email or web address])   C difficile quick scan w PCR reflex     Status: Abnormal   Collection Time: 09/16/17  1:24 PM  Result Value Ref Range Status   C Diff antigen POSITIVE (A) NEGATIVE Final   C Diff toxin NEGATIVE NEGATIVE Final   C Diff interpretation Results are indeterminate. See PCR results.  Final  Clostridium Difficile by PCR     Status: Abnormal   Collection Time: 09/16/17  1:24 PM  Result Value Ref Range Status   Toxigenic C. Difficile by PCR POSITIVE (A) NEGATIVE Final    Comment: Positive for toxigenic C. difficile with little to no toxin production. Only treat if clinical presentation suggests symptomatic illness.         Radiology Studies: Dg Abd 1 View  Result Date: 09/23/2017 CLINICAL DATA:  Celsius difficile colitis. Rule out toxic megacolon. Bloody diarrhea with little improvement. EXAM: ABDOMEN - 1 VIEW COMPARISON:  CT 06/25/2017, radiographs 12/27/2016 FINDINGS: The colon is not significantly dilated and no loss of haustral  markings is noted, ruling out toxic megacolon. There is some mucosal thickening and prominence of the haustral folds of the colon in keeping with patient's known Celsius difficile colitis. No significant small bowel dilatation. There is no free air. IMPRESSION: Submucosal edema and thickening of the haustral markings of the colon consistent with known colitis. No findings of toxic megacolon. Electronically Signed   By: Ashley Royalty M.D.   On: 09/23/2017 19:05        Scheduled Meds: . amiodarone  200 mg Oral BID  . atorvastatin  40 mg Oral QHS  . famotidine  20 mg Oral Daily  . feeding supplement (NEPRO CARB STEADY)  237 mL Oral TID BM  . feeding supplement (PRO-STAT SUGAR FREE 64)  30 mL Oral TID BM  . ferrous sulfate  325 mg Oral Q breakfast  . furosemide  80 mg Intravenous Q12H  . hydrocortisone  25 mg Rectal BID  . insulin aspart  0-9 Units Subcutaneous TID WC  . mouth rinse  15 mL Mouth Rinse BID  . sodium chloride flush  3 mL Intravenous Q12H  . traZODone  50 mg Oral QHS  . vancomycin  500 mg Oral Q6H  . vitamin B-12  1,000 mcg Oral Daily   Continuous Infusions: . metronidazole Stopped (09/24/17 0253)     LOS: 8 days    Time spent: 40 minutes    Zakayla Martinec, Geraldo Docker, MD [facility] Pager [phone]   If 7PM-7AM, please contact night-coverage [email or web address] Password [email or web address] 09/24/2017, 8:21 AM

## 2017-09-24 NOTE — Plan of Care (Signed)
Number of type 7 stools has remained constant (4-5 per shift); stools appear dark brown in color & no longer bloody; Hgb still 8.1; pt complained of sacral pain and abdominal tenderness this morning; repositioned & given tylenol; pt resting; appetite still dec; A&Ox2-3 -- confusion varies.   [place]  Jyrah Blye, RN

## 2017-09-24 NOTE — Clinical Social Work Note (Addendum)
No determination from [name] yet. CSW left voicemail for admissions coordinator.  [name], CSW [phone]  4:34 pm Received call back from [name]. They said that patient last discharged from there on October 19 so he has not been out of a facility, hospital, etc. for 60 days to start his SNF days over. He currently has 41 days left out of his 100. They do not have a contract with his secondary insurance so he will owe $167.50 per day if he returns. Also, before and if he returns, he will have to pay the 3-day balance he owes. They also do not transport to dialysis if he will end up needing that. CSW left voicemail for patient's wife but will have weekend social worker follow up with these details.  [name], [place]

## 2017-09-24 NOTE — Progress Notes (Signed)
Progress Note   Subjective  Nursing reports about 8-9 BMs in the past 24 hours. No further blood in the stool noted overnight or this AM. Reported dark brown stools. Patient denies abdominal pains, although nursing states he had endorsed some pain previously.    Objective   Vital signs in last 24 hours: Temp:  [97 F (36.1 C)-99.8 F (37.7 C)] 99.8 F (37.7 C) (12/14 0800) Pulse Rate:  [60-68] 66 (12/14 0800) Resp:  [12-20] 20 (12/14 0800) BP: (99-121)/(40-50) 103/41 (12/14 0800) SpO2:  [93 %-100 %] 95 % (12/14 0800) Weight:  [101.1 kg (222 lb 14.2 oz)] 101.1 kg (222 lb 14.2 oz) (12/14 0332) Last BM Date: 09/23/17 General:    white male in NAD Heart:  Regular rate and rhythm Lungs: Respirations even and unlabored, lungs CTA bilaterally anteriorly Abdomen:  Soft, nontender and nondistended.  Extremities:  Without edema. Psych:  Cooperative. Normal mood and affect.  Intake/Output from previous day: 12/13 0701 - 12/14 0700 In: 1166 [P.O.:960; I.V.:6; IV Piggyback:200] Out: 1000 [Urine:1000] Intake/Output this shift: Total I/O In: 3 [I.V.:3] Out: -   Lab Results: Recent Labs    09/22/17 0136 09/23/17 0444 09/24/17 0320  WBC 19.9* 20.7* 19.4*  HGB 9.2* 9.1* 8.1*  HCT 29.3* 29.5* 26.0*  PLT 246 259 248   BMET Recent Labs    09/22/17 0136 09/23/17 0444 09/24/17 0320  NA 132* 133* 131*  K 4.0 4.2 4.1  CL 93* 92* 93*  CO2 29 31 29   GLUCOSE 118* 142* 230*  BUN 113* 117* 123*  CREATININE 3.53* 3.24* 3.59*  CALCIUM 7.6* 7.6* 7.3*   LFT Recent Labs    09/24/17 0320  ALBUMIN 1.6*   PT/INR Recent Labs    09/22/17 0136  LABPROT 17.4*  INR 1.44    Studies/Results: Dg Abd 1 View  Result Date: 09/23/2017 CLINICAL DATA:  Celsius difficile colitis. Rule out toxic megacolon. Bloody diarrhea with little improvement. EXAM: ABDOMEN - 1 VIEW COMPARISON:  CT 06/25/2017, radiographs 12/27/2016 FINDINGS: The colon is not significantly dilated and no loss of  haustral markings is noted, ruling out toxic megacolon. There is some mucosal thickening and prominence of the haustral folds of the colon in keeping with patient's known Celsius difficile colitis. No significant small bowel dilatation. There is no free air. IMPRESSION: Submucosal edema and thickening of the haustral markings of the colon consistent with known colitis. No findings of toxic megacolon. Electronically Signed   By: Ashley Royalty M.D.   On: 09/23/2017 19:05       Assessment / Plan:    71 y/o male with severe C Diff associated with AKI on CKD. Previously had some associated rectal bleeding but none noted over 24 hours. I am concerned about his leukocytosis, worsening renal failure, and hypoalbuminemia. Yesterday we added IV flagyl to his regimen on top of oral vancomycin. Xray shows no evidence of megacolon. If he doesn't improve, we may need to consider fecal transplant. Suspect rectal bleeding is due to severe C Diff / hemorrhoidal in the setting of diarrhea, although other etiologies such as ischemic colitis also possible.   Recommend: - continue IV flagyl and oral vancomycin, also consideration for adding vancomycin enemas - holding PPI, and using pepsid for now - no obvious evidence for upper GI bleed - recommend consulting infectious disease in case stool transplantation is considered - may consider flex sig pending his course, however his rectal bleeding has stopped recently. Will continue to trend Hgb.  Will follow, please call with questions.   [place] Cellar, MD [facility] Pager [phone]

## 2017-09-24 NOTE — Progress Notes (Signed)
Nutrition Follow-up  DOCUMENTATION CODES:   Obesity unspecified  INTERVENTION:   -Continue Nepro Shake po TID, each supplement provides 425 kcal and 19 grams protein -Continue 30 ml Prostat TID, each supplement provides 100 kcals and 15 grams protein  NUTRITION DIAGNOSIS:   Increased nutrient needs related to wound healing as evidenced by estimated needs.  Ongoing  GOAL:   Patient will meet greater than or equal to 90% of their needs  Progressing  MONITOR:   PO intake, Supplement acceptance, Labs, Weight trends, Skin, I & O's  REASON FOR ASSESSMENT:   Consult (renal)  ASSESSMENT:   Christian Garner is a 71 y.o. male with medical history significant of congestive heart failure with an EF of 30% on home oxygen, coronary artery disease status post percutaneous coronary intervention greater than 1 year ago per patient records, atrial fibrillation chronically anticoagulated with warfarin, chronic kidney disease with a baseline creatinine of 1.3 did require hemodialysis during admission in January 2018, diabetes type 2 with chronic diabetic foot ulcer and renal disease who presents in transfer from [facility] for evaluation of acute on chronic renal failure.  12/7- rectal tube inserted 12/10- rectal tube removed  Per GI rectal bleeding has resolved. IV flagyl stopped on 09/24/17.   Pt being cleaned up at time of visit. Intake has improved; noted meal completion 0-405. Per MAR, pt is consuming both Prostat and Nepro supplements; will continue supplements to help pt meet nutritional needs.   Labs reviewed: Na: 131, Phos: 4.7, CBGS: 157-219 (inpatient orders for glycemic control are 0-9 units insulin aspart TID with meals).  Diet Order:  Diet renal with fluid restriction Fluid restriction: 1200 mL Fluid; Room service appropriate? Yes; Fluid consistency: Thin  EDUCATION NEEDS:   Not appropriate for education at this time  Skin:  Skin Assessment: Skin Integrity  Issues: Skin Integrity Issues:: Stage II, Other (Comment), Stage III Stage II: inner gluteal fold Stage III: lt lateral foot Diabetic Ulcer: n/a Other: rt leg venous stasis ulcer  Last BM:  09/23/17  Height:   Ht Readings from Last 1 Encounters:  09/16/17 6' (1.829 m)    Weight:   Wt Readings from Last 1 Encounters:  09/24/17 222 lb 14.2 oz (101.1 kg)    Ideal Body Weight:  80.9 kg  BMI:  Body mass index is 30.23 kg/m.  Estimated Nutritional Needs:   Kcal:  2200-2400  Protein:  125-140 grams  Fluid:  1-1.5 L    Breshae Belcher A. Jimmye Norman, RD, LDN, CDE Pager: [phone] After hours Pager: [phone]

## 2017-09-25 DIAGNOSIS — D600 Chronic acquired pure red cell aplasia: Secondary | ICD-10-CM

## 2017-09-25 LAB — RENAL FUNCTION PANEL
Albumin: 2.1 g/dL — ABNORMAL LOW (ref 3.5–5.0)
Anion gap: 10 (ref 5–15)
BUN: 126 mg/dL — ABNORMAL HIGH (ref 6–20)
CALCIUM: 7.5 mg/dL — AB (ref 8.9–10.3)
CHLORIDE: 95 mmol/L — AB (ref 101–111)
CO2: 28 mmol/L (ref 22–32)
CREATININE: 3.61 mg/dL — AB (ref 0.61–1.24)
GFR calc Af Amer: 18 mL/min — ABNORMAL LOW (ref >60)
GFR calc non Af Amer: 15 mL/min — ABNORMAL LOW (ref >60)
GLUCOSE: 138 mg/dL — AB (ref 65–99)
Phosphorus: 4.6 mg/dL (ref 2.5–4.6)
Potassium: 3.8 mmol/L (ref 3.5–5.1)
SODIUM: 133 mmol/L — AB (ref 135–145)

## 2017-09-25 LAB — CBC WITH DIFFERENTIAL/PLATELET
Basophils Absolute: 0 10*3/uL (ref 0.0–0.1)
Basophils Relative: 0 %
EOS ABS: 0.1 10*3/uL (ref 0.0–0.7)
Eosinophils Relative: 1 %
HCT: 26.4 % — ABNORMAL LOW (ref 39.0–52.0)
HEMOGLOBIN: 8.2 g/dL — AB (ref 13.0–17.0)
LYMPHS ABS: 1.2 10*3/uL (ref 0.7–4.0)
Lymphocytes Relative: 7 %
MCH: 29 pg (ref 26.0–34.0)
MCHC: 31.1 g/dL (ref 30.0–36.0)
MCV: 93.3 fL (ref 78.0–100.0)
Monocytes Absolute: 1.1 10*3/uL — ABNORMAL HIGH (ref 0.1–1.0)
Monocytes Relative: 7 %
NEUTROS ABS: 13.3 10*3/uL — AB (ref 1.7–7.7)
NEUTROS PCT: 85 %
Platelets: 250 10*3/uL (ref 150–400)
RBC: 2.83 MIL/uL — AB (ref 4.22–5.81)
RDW: 17.5 % — ABNORMAL HIGH (ref 11.5–15.5)
WBC: 15.7 10*3/uL — AB (ref 4.0–10.5)

## 2017-09-25 LAB — GLUCOSE, CAPILLARY
GLUCOSE-CAPILLARY: 106 mg/dL — AB (ref 65–99)
GLUCOSE-CAPILLARY: 156 mg/dL — AB (ref 65–99)
GLUCOSE-CAPILLARY: 186 mg/dL — AB (ref 65–99)
Glucose-Capillary: 132 mg/dL — ABNORMAL HIGH (ref 65–99)

## 2017-09-25 LAB — CBC
HCT: 23.6 % — ABNORMAL LOW (ref 39.0–52.0)
HEMOGLOBIN: 7.3 g/dL — AB (ref 13.0–17.0)
MCH: 29.2 pg (ref 26.0–34.0)
MCHC: 30.9 g/dL (ref 30.0–36.0)
MCV: 94.4 fL (ref 78.0–100.0)
Platelets: 242 10*3/uL (ref 150–400)
RBC: 2.5 MIL/uL — ABNORMAL LOW (ref 4.22–5.81)
RDW: 17.3 % — AB (ref 11.5–15.5)
WBC: 15.7 10*3/uL — ABNORMAL HIGH (ref 4.0–10.5)

## 2017-09-25 LAB — PREPARE RBC (CROSSMATCH)

## 2017-09-25 LAB — ABO/RH: ABO/RH(D): A NEG

## 2017-09-25 LAB — MAGNESIUM: Magnesium: 2.5 mg/dL — ABNORMAL HIGH (ref 1.7–2.4)

## 2017-09-25 MED ORDER — SODIUM CHLORIDE 0.9 % IV SOLN
Freq: Once | INTRAVENOUS | Status: AC
  Administered 2017-09-25: 09:00:00 via INTRAVENOUS

## 2017-09-25 MED ORDER — FAMOTIDINE IN NACL 20-0.9 MG/50ML-% IV SOLN
20.0000 mg | Freq: Two times a day (BID) | INTRAVENOUS | Status: DC
  Administered 2017-09-25 – 2017-09-26 (×3): 20 mg via INTRAVENOUS
  Filled 2017-09-25 (×3): qty 50

## 2017-09-25 NOTE — Progress Notes (Signed)
Progress Note   Subjective  Patient's stool frequency seems to be getting better. Nursing reports perianal irritation from flexiseal with some mild bleeding in that area. Otherwise denies abdominal pain. He had an episode of vomiting last night without blood noted. Hgb has drifted down.     Objective   Vital signs in last 24 hours: Temp:  [98 F (36.7 C)-99.8 F (37.7 C)] 98 F (36.7 C) (12/15 0320) Pulse Rate:  [58-70] 67 (12/15 0700) Resp:  [12-20] 17 (12/15 0700) BP: (96-109)/(40-58) 101/43 (12/15 0400) SpO2:  [92 %-100 %] 100 % (12/15 0700) Weight:  [101.8 kg (224 lb 6.9 oz)] 101.8 kg (224 lb 6.9 oz) (12/15 0320) Last BM Date: 09/23/17 General:    white male in NAD Heart:  Regular rate and rhythm; Lungs: Respirations even and unlabored,  Abdomen:  Soft, nontender and nondistended. Normal bowel sounds. Extremities:  Without edema. Neurologic:  Alert and oriented,  grossly normal neurologically. Psych:  Cooperative. Normal mood and affect.  Intake/Output from previous day: 12/14 0701 - 12/15 0700 In: 601 [P.O.:598; I.V.:3] Out: 1125 [Urine:1125] Intake/Output this shift: No intake/output data recorded.  Lab Results: Recent Labs    09/24/17 0320 09/24/17 1913 09/25/17 0245  WBC 19.4* 18.8* 15.7*  HGB 8.1* 7.9* 7.3*  HCT 26.0* 25.7* 23.6*  PLT 248 249 242   BMET Recent Labs    09/24/17 0320 09/24/17 1913 09/25/17 0245  NA 131* 132* 133*  K 4.1 3.9 3.8  CL 93* 94* 95*  CO2 29 28 28   GLUCOSE 230* 158* 138*  BUN 123* 130* 126*  CREATININE 3.59* 3.79* 3.61*  CALCIUM 7.3* 7.4* 7.5*   LFT Recent Labs    09/25/17 0245  ALBUMIN 2.1*   PT/INR No results for input(s): LABPROT, INR in the last 72 hours.  Studies/Results: Dg Abd 1 View  Result Date: 09/23/2017 CLINICAL DATA:  Celsius difficile colitis. Rule out toxic megacolon. Bloody diarrhea with little improvement. EXAM: ABDOMEN - 1 VIEW COMPARISON:  CT 06/25/2017, radiographs 12/27/2016  FINDINGS: The colon is not significantly dilated and no loss of haustral markings is noted, ruling out toxic megacolon. There is some mucosal thickening and prominence of the haustral folds of the colon in keeping with patient's known Celsius difficile colitis. No significant small bowel dilatation. There is no free air. IMPRESSION: Submucosal edema and thickening of the haustral markings of the colon consistent with known colitis. No findings of toxic megacolon. Electronically Signed   By: Ashley Royalty M.D.   On: 09/23/2017 19:05       Assessment / Plan:   71 y/o male with severe C Diff associated with AKI on CKD. Previously had some associated rectal bleeding, although nothing significant recently, and episode of non bloody emesis yesterday. Appreciate ID consultation - given his leukocytosis, renal failure, hypoalbuminemia, wanted them on board in case stool transplant is considered (I did not realize this is not an option for inpatients). Overall, since adding IV flagyl to his regimen on top of oral vancomycin his stool frequency and leukocytosis has improved. Xray shows no evidence of megacolon. I suspect rectal bleeding is due to severe C Diff / hemorrhoidal in the setting of diarrhea, although his Hgb has drifted down today. May need blood transfusion. He is not actively hemorrhaging. We can consider flex sig pending his course, in the setting of severe colitis would hope to avoid endoscopy if possible.   Recommend: - continue IV flagyl and oral vancomycin - holding PPI,  and using pepsid for now - no obvious evidence for upper GI bleed. Change pepsid to IV and add zofran PRN for nausea - may consider flex sig pending his course, however would hope to avoid this given the severity of his colitis. May need PRBC transfusion today, please let me know if he has any significant bleeding.   Will continue to follow, please call with questions.   Christian Cellar, MD [facility] Gastroenterology Pager  [phone]

## 2017-09-25 NOTE — Progress Notes (Addendum)
PROGRESS NOTE    Christian Garner  KDX:[id number] DOB: 03/28/1946 DOA: 09/16/2017 PCP: [name], MD   Brief Narrative:  71 y.o. WM PMHx Systolic CHF ( EF of 97%),QBH s/p PTCA greater than 1 year ago, Atrial Fibrillation chronically anticoagulated with warfarin, Chronic Respiratory Failure with Hypoxia ( on home oxygen),  CKD( baseline Cr  1.3 ;HD during admission in January 2018), and DM Type 2 controlled with complication (chronic diabetic foot ulcer)  Who presented in transfer from [facility] for evaluation of acute on chronic renal failure.   Patient presented to [name] on December 3 acutely ill with evidence of sepsis.  He was found to have C. difficile colitis.  He was in acute renal failure upon presentation and his renal function failed to improve.  Consultation was sought with Dr. Justin Mend per telephone and recommendation was made for transfer to [facility].  On initial presentation his INR was markedly elevated at 5.8 and increased to 8.4 on the day after admission.  At that point he received treatment w/ vitamin K.  On the day prior to transfer INR was down to 2.5.       Subjective: 12/15 more awake today. Still somewhat sleepy. A/O 2 (does not know when, why). Mild confusion but easily redirectable. Negative CP, negative SOB, negative abdominal pain. States diarrhea appears to be improving.  Assessment & Plan:   Principal Problem:   Acute kidney injury superimposed on chronic kidney disease ([place]) Active Problems:   Acute on chronic combined systolic and diastolic CHF (congestive heart failure) (HCC)   AF (paroxysmal atrial fibrillation) (HCC)   Pressure injury of skin   Hyponatremia   CAD (coronary artery disease)   C. difficile colitis   Warfarin anticoagulation   Type 2 diabetes mellitus with left diabetic foot ulcer (HCC)   Anemia due to chronic kidney disease   CKD (chronic kidney disease), stage III ([place])   Acute on CKD stage IIII(Baseline Cr  1.47-2.31) -Nephrology on board. Per EMR felt to be ATN secondary to sepsis  Recent Labs  Lab 09/21/17 0226 09/22/17 0136 09/23/17 0444 09/24/17 0320 09/24/17 1913 09/25/17 0245  CREATININE 3.55* 3.53* 3.24* 3.59* 3.79* 3.61*  -BUN/Cr now arising with continued diuresis. Hold Lasix believe overdiuresised. BUN/CR ratio 34/1    Acute on Chronic combined Systolic and Diastolic CHF   -Strict in and out since admission -6.1 L -Daily weight Filed Weights   09/23/17 0500 09/24/17 0332 09/25/17 0320  Weight: 220 lb 10.9 oz (100.1 kg) 222 lb 14.2 oz (101.1 kg) 224 lb 6.9 oz (101.8 kg)  -Amiodarone 200 mg BID -12/14 discontinue Lasix 80 mg BID -Transfuse for hemoglobin <8 -12/15 transfuse 1 unit PRBC  Chronic Paroxysmal Atrial fibrillation with RVR -Currently in NSR -See CHF -Warfarin per pharmacy Recent Labs  Lab 09/19/17 0309 09/20/17 0215 09/21/17 0223 09/22/17 0136  INR 5.78* 3.27 3.34 1.44  -Currently subtherapeutic after receiving 2.5 mg + 5 mg vitamin K secondary to being supratherapeutic. Received secondary to GI bleed. -Anticoagulation on hold with continued diarrhea/melena   CAD -See CHF  Upper GI bleed   -Witnessed melanic diarrhea stools today: Hemoglobin slowly trending down. Recent Labs  Lab 09/21/17 0223 09/22/17 0136 09/23/17 0444 09/24/17 0320 09/24/17 1913 09/25/17 0245  HGB 9.3* 9.2* 9.1* 8.1* 7.9* 7.3*  -see CHF    Mild Acute Delirium vs Dementia   -After patient's baseline -BUN continues to climb. Most likely uremic component, await nephrology recommendation watchful waiting vs CRRT? Marland Kitchen  C. difficile colitis  -Consulted GI and recommended combination therapy PO Vancomycin 10 days + Metronidazole IV.   -12/14 consulted GI. And they placed patient on dual therapy for C. difficile colitis. In addition requested that I contact ID for evaluation for stool transplant. -Per ID PO vancomycin + IV Flagyl for 14 days. Followed by prolonged PO vancomycin  taper :  Week 1-2 vancomycin 500mg  po qid for 14 days Week 3 vancomycin po 125mg  bid for 7 days Week 4 vancomycin po 125mg  qday for 7 days Week 5-9 "                    "   125mg  qoday for 28 days Week 10-14 "                             "  q 3 days for 28 days -If stool transplant consider would be done as an outpatient procedure by Dr. Baxter Flattery ID   Diabetes type 2 Controlled with Complication/LEFT Diabetic Foot Ulcer -12/6 Hemoglobin A1c= 6.1 -Sensitive SSI   Multiple cutaneous injuries -LEFT diabetic foot ulcer/Surgical site; LEFT lateral 5th toe amputation  -Venous stasis: RIGHT LOWER EXTREMITY x 2 ruptured bulla, most likely related to edema -Stage 2 Pressure injury upper GLUTEAL CLEFT  with moisture component, patient incontinent of stool  -Wound care has been consulted  Acute blood loss anemia/Anemia of Chronic Kidney disease/Acute illness   -Anemia panel consistent with anemia of chronic disease      DVT prophylaxis: SCD Code Status: Full Family Communication: None Disposition Plan: TBD   Consultants:  Nephrology    Procedures/Significant Events:  12/3 admit to [facility] w/ sepsis due to C. Diff 12/6 transfer to [facility]  12/7 flexiseal inserted (29 day limit) 12/9 dosed w/ vitamin K 12/10 developed evidence of maroon stools - flexiseal removed  12/15 transfuse 1 unit PRBC   I have personally reviewed and interpreted all radiology studies and my findings are as above.  VENTILATOR SETTINGS:    Cultures 12/6 MRSA by PCR positive 12/6 stool C. difficile antigen positive, C. difficile toxin positive    Antimicrobials: Anti-infectives (From admission, onward)   Start     Dose/Rate Stop   09/24/17 0030  metroNIDAZOLE (FLAGYL) IVPB 500 mg     500 mg 100 mL/hr over 60 Minutes     09/23/17 1600  metroNIDAZOLE (FLAGYL) IVPB 500 mg  Status:  Discontinued     500 mg 100 mL/hr over 60 Minutes 09/23/17 1859   09/16/17 1200  vancomycin (VANCOCIN) 50 mg/mL  oral solution 500 mg     500 mg         Devices    LINES / TUBES:      Continuous Infusions: . famotidine (PEPCID) IV    . metronidazole 500 mg (09/25/17 0832)     Objective: Vitals:   09/25/17 0500 09/25/17 0600 09/25/17 0700 09/25/17 0811  BP:    94/72  Pulse: 65 70 67 63  Resp: 17 17 17 12   Temp:    98 F (36.7 C)  TempSrc:    Oral  SpO2: 92% 97% 100% 99%  Weight:      Height:        Intake/Output Summary (Last 24 hours) at 09/25/2017 0855 Last data filed at 09/25/2017 0811 Gross per 24 hour  Intake 361 ml  Output 1425 ml  Net -1064 ml   [employer]  09/23/17 0500 09/24/17 0332 09/25/17 0320  Weight: 220 lb 10.9 oz (100.1 kg) 222 lb 14.2 oz (101.1 kg) 224 lb 6.9 oz (101.8 kg)    Physical Exam:  General: A/O 2 (does not know when, why ), No acute respiratory distress Neck:  Negative scars, masses, torticollis, lymphadenopathy, JVD Lungs: Clear to auscultation bilaterally without wheezes or crackles Cardiovascular: Regular rate and rhythm without murmur gallop or rub normal S1 and S2 Abdomen: negative abdominal pain, nondistended, positive soft, bowel sounds, no rebound, no ascites, no appreciable mass Extremities: No significant cyanosis, clubbing, or edema bilateral lower extremities Skin: Negative rashes, lesions, ulcers Psychiatric:  Cannot fully assess secondary to altered mental status.  Central nervous system:  Cranial nerves II through XII intact, tongue/uvula midline, all extremities muscle strength 5/5, sensation intact throughout,  negative dysarthria, negative expressive aphasia, negative receptive aphasia. .     Data Reviewed: Care during the described time interval was provided by me .  I have reviewed this patient's available data, including medical history, events of note, physical examination, and all test results as part of my evaluation.   CBC: Recent Labs  Lab 09/22/17 0136 09/23/17 0444 09/24/17 0320 09/24/17 1913  09/25/17 0245  WBC 19.9* 20.7* 19.4* 18.8* 15.7*  NEUTROABS  --   --   --  15.9*  --   HGB 9.2* 9.1* 8.1* 7.9* 7.3*  HCT 29.3* 29.5* 26.0* 25.7* 23.6*  MCV 93.6 93.9 94.2 94.8 94.4  PLT 246 259 248 249 678   Basic Metabolic Panel: Recent Labs  Lab 09/21/17 0226 09/22/17 0136 09/23/17 0444 09/24/17 0320 09/24/17 1913 09/25/17 0245  NA 132* 132* 133* 131* 132* 133*  K 3.9 4.0 4.2 4.1 3.9 3.8  CL 92* 93* 92* 93* 94* 95*  CO2 30 29 31 29 28 28   GLUCOSE 119* 118* 142* 230* 158* 138*  BUN 110* 113* 117* 123* 130* 126*  CREATININE 3.55* 3.53* 3.24* 3.59* 3.79* 3.61*  CALCIUM 7.8* 7.6* 7.6* 7.3* 7.4* 7.5*  MG  --   --  2.6* 2.4 2.7* 2.5*  PHOS 5.4* 5.2* 5.5* 4.7*  --  4.6   GFR: Estimated Creatinine Clearance: 22.8 mL/min (A) (by C-G formula based on SCr of 3.61 mg/dL (H)). Liver Function Tests: Recent Labs  Lab 09/21/17 0226 09/22/17 0136 09/23/17 0444 09/24/17 0320 09/25/17 0245  ALBUMIN 1.7* 1.8* 1.7* 1.6* 2.1*   No results for input(s): LIPASE, AMYLASE in the last 168 hours. No results for input(s): AMMONIA in the last 168 hours. Coagulation Profile: Recent Labs  Lab 09/19/17 0309 09/20/17 0215 09/21/17 0223 09/22/17 0136  INR 5.78* 3.27 3.34 1.44   Cardiac Enzymes: No results for input(s): CKTOTAL, CKMB, CKMBINDEX, TROPONINI in the last 168 hours. BNP (last 3 results) No results for input(s): PROBNP in the last 8760 hours. HbA1C: No results for input(s): HGBA1C in the last 72 hours. CBG: Recent Labs  Lab 09/24/17 0816 09/24/17 1225 09/24/17 1617 09/24/17 2119 09/25/17 0805  GLUCAP 157* 114* 157* 179* 106*   Lipid Profile: No results for input(s): CHOL, HDL, LDLCALC, TRIG, CHOLHDL, LDLDIRECT in the last 72 hours. Thyroid Function Tests: No results for input(s): TSH, T4TOTAL, FREET4, T3FREE, THYROIDAB in the last 72 hours. Anemia Panel: No results for input(s): VITAMINB12, FOLATE, FERRITIN, TIBC, IRON, RETICCTPCT in the last 72 hours. Urine  analysis:    Component Value Date/Time   COLORURINE YELLOW 09/16/2017 1753   APPEARANCEUR HAZY (A) 09/16/2017 1753   LABSPEC >1.030 (H) 09/16/2017 1753   PHURINE  5.0 09/16/2017 1753   GLUCOSEU NEGATIVE 09/16/2017 1753   HGBUR MODERATE (A) 09/16/2017 1753   BILIRUBINUR NEGATIVE 09/16/2017 1753   KETONESUR NEGATIVE 09/16/2017 1753   PROTEINUR NEGATIVE 09/16/2017 1753   NITRITE NEGATIVE 09/16/2017 1753   LEUKOCYTESUR NEGATIVE 09/16/2017 1753   Sepsis Labs: @LABRCNTIP (procalcitonin:4,lacticidven:4)  ) Recent Results (from the past 240 hour(s))  MRSA PCR Screening     Status: Abnormal   Collection Time: 09/16/17 10:38 AM  Result Value Ref Range Status   MRSA by PCR POSITIVE (A) NEGATIVE Final    Comment:        The GeneXpert MRSA Assay (FDA approved for NASAL specimens only), is one component of a comprehensive MRSA colonization surveillance program. It is not intended to diagnose MRSA infection nor to guide or monitor treatment for MRSA infections. RESULT CALLED TO, READ BACK BY AND VERIFIED WITH: H. Deaton RN 12:20 09/16/17 ([email or web address])   C difficile quick scan w PCR reflex     Status: Abnormal   Collection Time: 09/16/17  1:24 PM  Result Value Ref Range Status   C Diff antigen POSITIVE (A) NEGATIVE Final   C Diff toxin NEGATIVE NEGATIVE Final   C Diff interpretation Results are indeterminate. See PCR results.  Final  Clostridium Difficile by PCR     Status: Abnormal   Collection Time: 09/16/17  1:24 PM  Result Value Ref Range Status   Toxigenic C. Difficile by PCR POSITIVE (A) NEGATIVE Final    Comment: Positive for toxigenic C. difficile with little to no toxin production. Only treat if clinical presentation suggests symptomatic illness.         Radiology Studies: Dg Abd 1 View  Result Date: 09/23/2017 CLINICAL DATA:  Celsius difficile colitis. Rule out toxic megacolon. Bloody diarrhea with little improvement. EXAM: ABDOMEN - 1 VIEW COMPARISON:  CT 06/25/2017,  radiographs 12/27/2016 FINDINGS: The colon is not significantly dilated and no loss of haustral markings is noted, ruling out toxic megacolon. There is some mucosal thickening and prominence of the haustral folds of the colon in keeping with patient's known Celsius difficile colitis. No significant small bowel dilatation. There is no free air. IMPRESSION: Submucosal edema and thickening of the haustral markings of the colon consistent with known colitis. No findings of toxic megacolon. Electronically Signed   By: Ashley Royalty M.D.   On: 09/23/2017 19:05        Scheduled Meds: . amiodarone  200 mg Oral BID  . atorvastatin  40 mg Oral QHS  . feeding supplement (NEPRO CARB STEADY)  237 mL Oral TID BM  . feeding supplement (PRO-STAT SUGAR FREE 64)  30 mL Oral TID BM  . ferrous sulfate  325 mg Oral Q breakfast  . hydrocortisone  25 mg Rectal BID  . insulin aspart  0-9 Units Subcutaneous TID WC  . mouth rinse  15 mL Mouth Rinse BID  . sodium chloride flush  3 mL Intravenous Q12H  . traZODone  50 mg Oral QHS  . vancomycin  500 mg Oral Q6H  . vitamin B-12  1,000 mcg Oral Daily   Continuous Infusions: . famotidine (PEPCID) IV    . metronidazole 500 mg (09/25/17 0832)     LOS: 9 days    Time spent: 40 minutes    Jillianna Stanek, Geraldo Docker, MD [facility] Pager [phone]   If 7PM-7AM, please contact night-coverage [email or web address] Password [email or web address] 09/25/2017, 8:55 AM

## 2017-09-26 DIAGNOSIS — F0151 Vascular dementia with behavioral disturbance: Secondary | ICD-10-CM

## 2017-09-26 LAB — RENAL FUNCTION PANEL
ANION GAP: 9 (ref 5–15)
Albumin: 1.9 g/dL — ABNORMAL LOW (ref 3.5–5.0)
BUN: 125 mg/dL — ABNORMAL HIGH (ref 6–20)
CHLORIDE: 97 mmol/L — AB (ref 101–111)
CO2: 28 mmol/L (ref 22–32)
CREATININE: 3.28 mg/dL — AB (ref 0.61–1.24)
Calcium: 7.5 mg/dL — ABNORMAL LOW (ref 8.9–10.3)
GFR, EST AFRICAN AMERICAN: 20 mL/min — AB (ref >60)
GFR, EST NON AFRICAN AMERICAN: 17 mL/min — AB (ref >60)
Glucose, Bld: 149 mg/dL — ABNORMAL HIGH (ref 65–99)
POTASSIUM: 3.8 mmol/L (ref 3.5–5.1)
Phosphorus: 3.9 mg/dL (ref 2.5–4.6)
Sodium: 134 mmol/L — ABNORMAL LOW (ref 135–145)

## 2017-09-26 LAB — TYPE AND SCREEN
ABO/RH(D): A NEG
ANTIBODY SCREEN: NEGATIVE
Unit division: 0

## 2017-09-26 LAB — BPAM RBC
BLOOD PRODUCT EXPIRATION DATE: 201812242359
ISSUE DATE / TIME: 201812151135
UNIT TYPE AND RH: 600

## 2017-09-26 LAB — GLUCOSE, CAPILLARY
GLUCOSE-CAPILLARY: 186 mg/dL — AB (ref 65–99)
GLUCOSE-CAPILLARY: 165 mg/dL — AB (ref 65–99)
GLUCOSE-CAPILLARY: 129 mg/dL — AB (ref 65–99)
Glucose-Capillary: 135 mg/dL — ABNORMAL HIGH (ref 65–99)
Glucose-Capillary: 183 mg/dL — ABNORMAL HIGH (ref 65–99)

## 2017-09-26 LAB — CBC
HEMATOCRIT: 27.3 % — AB (ref 39.0–52.0)
HEMOGLOBIN: 8.5 g/dL — AB (ref 13.0–17.0)
MCH: 29.1 pg (ref 26.0–34.0)
MCHC: 31.1 g/dL (ref 30.0–36.0)
MCV: 93.5 fL (ref 78.0–100.0)
PLATELETS: 250 10*3/uL (ref 150–400)
RBC: 2.92 MIL/uL — AB (ref 4.22–5.81)
RDW: 18.1 % — ABNORMAL HIGH (ref 11.5–15.5)
WBC: 13.5 10*3/uL — AB (ref 4.0–10.5)

## 2017-09-26 LAB — MAGNESIUM: Magnesium: 2.7 mg/dL — ABNORMAL HIGH (ref 1.7–2.4)

## 2017-09-26 MED ORDER — FAMOTIDINE 20 MG PO TABS
20.0000 mg | ORAL_TABLET | Freq: Every day | ORAL | Status: DC
  Administered 2017-09-27 – 2017-09-28 (×2): 20 mg via ORAL
  Filled 2017-09-26 (×2): qty 1

## 2017-09-26 MED ORDER — FUROSEMIDE 10 MG/ML IJ SOLN
40.0000 mg | Freq: Two times a day (BID) | INTRAMUSCULAR | Status: DC
  Administered 2017-09-26 – 2017-10-01 (×11): 40 mg via INTRAVENOUS
  Filled 2017-09-26 (×11): qty 4

## 2017-09-26 NOTE — Progress Notes (Addendum)
RN notified that PICC will not be placed today.  States has consent signed by wife already. Nephrology needs to approve PICC prior to insertion. Dr Sherral Hammers paged to notify.

## 2017-09-26 NOTE — Progress Notes (Signed)
      Progress Note   Subjective  Patient looks better today. States he has had no further rectal bleeding. Mental status appears appropriate. Hgb stable   Objective   Vital signs in last 24 hours: Temp:  [98 F (36.7 C)-98.5 F (36.9 C)] 98.4 F (36.9 C) (12/16 1326) Pulse Rate:  [61-69] 61 (12/16 1326) Resp:  [12-21] 16 (12/16 1200) BP: (100-115)/(39-56) 113/52 (12/16 1326) SpO2:  [94 %-100 %] 98 % (12/16 1326) Weight:  [102.9 kg (226 lb 13.7 oz)] 102.9 kg (226 lb 13.7 oz) (12/16 0534) Last BM Date: 09/25/17 General:    white male in NAD Heart:  Regular rate and rhythm; no murmurs Lungs: Respirations even and unlabored, lungs CTA bilaterally Abdomen:  Soft, nontender and nondistended.  Extremities:  Without edema. Neurologic:  Alert and oriented,  grossly normal neurologically. Psych:  Cooperative. Normal mood and affect.  Intake/Output from previous day: 12/15 0701 - 12/16 0700 In: 1903 [P.O.:1200; Blood:703] Out: 1225 [Urine:1225] Intake/Output this shift: Total I/O In: 240 [P.O.:240] Out: -   Lab Results: Recent Labs    09/25/17 0245 09/25/17 1646 09/26/17 0246  WBC 15.7* 15.7* 13.5*  HGB 7.3* 8.2* 8.5*  HCT 23.6* 26.4* 27.3*  PLT 242 250 250   BMET Recent Labs    09/24/17 1913 09/25/17 0245 09/26/17 0246  NA 132* 133* 134*  K 3.9 3.8 3.8  CL 94* 95* 97*  CO2 28 28 28   GLUCOSE 158* 138* 149*  BUN 130* 126* 125*  CREATININE 3.79* 3.61* 3.28*  CALCIUM 7.4* 7.5* 7.5*   LFT Recent Labs    09/26/17 0246  ALBUMIN 1.9*   PT/INR No results for input(s): LABPROT, INR in the last 72 hours.  Studies/Results: No results found.     Assessment / Plan:   71 y/o male with severe C Diff associated with AKI on CKD. Previously had some associated rectal bleeding, although this has mostly stopped over the past 2 days. His Hgb has drifted down over recent days, leading to transfusion of RBC which he responded appropriately to. I suspect his bleeding is  due to severe C Diff colitis or hemorrhoidal in the setting of diarrhea, although he does warrant a colonoscopy when he recovers from this (it has been over 10 years since his last exam). We have avoided endoscopy recently in the setting of severe C Diff.  Overall he does appear to be improving since adding IV flagyl. WBC continues to downtrend, renal function slowly improving, no rectal bleeding per nursing in 2 days.  Recommend: - continue IV flagyl and oral vancomycin for full course - we have asked ID about stool transplant previously, this is not an option for inpatients - holding PPI, and using pepsid for now - no obvious evidence for upper GI bleed.  - colonoscopy as outpatient after he recovers from this. If significant bleeding in the interim may consider flex sig pending his course, however would hope to avoid this given the severity of his colitis.    Please call with questions.Dr. Carlean Purl assuming GI service tomorrow.  Christian Cellar, MD [name] Gastroenterology Pager [phone]

## 2017-09-26 NOTE — Progress Notes (Signed)
Received pt from [facility] via bed. Pt is A&O x3. Calm and cooperative.  Rectal tube( Flexi-seal) in place, brown liquid stools noted. Pt is c/o burning, pain to buttocks, worsening MASD. Moisture cream barrier applied. WOC consult placed. Reposition pt to sides.

## 2017-09-26 NOTE — Progress Notes (Addendum)
PROGRESS NOTE    Christian Garner  RKY:[id number] DOB: 12/21/1945 DOA: 09/16/2017 PCP: [name], MD   Brief Narrative:  71 y.o. WM PMHx Systolic CHF ( EF of 31%),DVV s/p PTCA greater than 1 year ago, Atrial Fibrillation chronically anticoagulated with warfarin, Chronic Respiratory Failure with Hypoxia ( on home oxygen),  CKD( baseline Cr  1.3 ;HD during admission in January 2018), and DM Type 2 controlled with complication (chronic diabetic foot ulcer)  Who presented in transfer from [facility] for evaluation of acute on chronic renal failure.   Patient presented to [facility] on December 3 acutely ill with evidence of sepsis.  He was found to have C. difficile colitis.  He was in acute renal failure upon presentation and his renal function failed to improve.  Consultation was sought with Dr. Justin Mend per telephone and recommendation was made for transfer to [facility].  On initial presentation his INR was markedly elevated at 5.8 and increased to 8.4 on the day after admission.  At that point he received treatment w/ vitamin K.  On the day prior to transfer INR was down to 2.5.       Subjective: 12/16 patient extremely agitated, however just had Felexiseal reinserted secondary to continued diarrhea (non-colonic per RN Christian Garner). A/O 2 (does not know when, why). Negative CP, negative SOB, negative abdominal pain. Patient fixating on removing at least one of his IV lines from his hand. Unable to convince patient of necessity of dual IV access given the amount of medication, fluids, electrolyte replacement required. Offered option of placement CVL (patient refused). Agreed to revisit issue when wife visits today.     Assessment & Plan:   Principal Problem:   Acute kidney injury superimposed on chronic kidney disease ([place]) Active Problems:   Acute on chronic combined systolic and diastolic CHF (congestive heart failure) (HCC)   AF (paroxysmal atrial fibrillation) (HCC)   Pressure  injury of skin   Hyponatremia   CAD (coronary artery disease)   C. difficile colitis   Warfarin anticoagulation   Type 2 diabetes mellitus with left diabetic foot ulcer (HCC)   Anemia due to chronic kidney disease   CKD (chronic kidney disease), stage III ([place])   Acute on CKD stage IIII(Baseline Cr 1.47-2.31) -Nephrology on board. Per EMR felt to be ATN secondary to sepsis  Recent Labs  Lab 09/22/17 0136 09/23/17 0444 09/24/17 0320 09/24/17 1913 09/25/17 0245 09/26/17 0246  CREATININE 3.53* 3.24* 3.59* 3.79* 3.61* 3.28*  -BUN/Cr improving with diuresis -Nephrology signed off on 12/11.     Acute on Chronic combined Systolic and Diastolic CHF   -Strict in and out since admission -6.1 L -Daily weight Filed Weights   09/24/17 0332 09/25/17 0320 09/26/17 0534  Weight: 222 lb 14.2 oz (101.1 kg) 224 lb 6.9 oz (101.8 kg) 226 lb 13.7 oz (102.9 kg)  -Amiodarone 200 mg BID -Transfuse for hemoglobin <8 -12/15 transfuse 1 unit PRBC -12/16 start Lasix 40 mg BID  Chronic Paroxysmal Atrial fibrillation with RVR -Currently in NSR -See CHF -Warfarin per pharmacy Recent Labs  Lab 09/20/17 0215 09/21/17 0223 09/22/17 0136  INR 3.27 3.34 1.44  -Currently subtherapeutic after receiving 2.5 mg + 5 mg vitamin K secondary to being supratherapeutic. Received secondary to GI bleed. -Anticoagulation on hold. Will continue to hold until patient's hemoglobin stabilizes for 48-72 hours. Required blood products on 12/15   CAD -See CHF  Upper GI bleed   Recent Labs  Lab 09/23/17 0444 09/24/17 0320 09/24/17  1913 09/25/17 0245 09/25/17 1646 09/26/17 0246  HGB 9.1* 8.1* 7.9* 7.3* 8.2* 8.5*  -see CHF    Dementia   -Discussed case with RN Christian Garner at length. Very familiar with patient states this is patient's baseline. -BUN stable still may be uremic component exacerbating his underlying dementia BUN continues to climb. -See renal failure  C. difficile colitis   -12/14 consulted GI.  And they placed patient on dual therapy for C. difficile colitis. In addition requested that I contact ID for evaluation for stool transplant. -Per ID PO vancomycin + IV Flagyl for 14 days. Followed by prolonged PO vancomycin taper :  Week 1-2 vancomycin 500mg  po qid for 14 days Week 3 vancomycin po 125mg  bid for 7 days Week 4 vancomycin po 125mg  qday for 7 days Week 5-9 "                    "   125mg  qoday for 28 days Week 10-14 "                             "  q 3 days for 28 days -If stool transplant consider would be done as an outpatient procedure by Dr. Baxter Flattery ID --12/16 Requested PICC Line placement for extended IV ABx   Diabetes type 2 Controlled with Complication/LEFT Diabetic Foot Ulcer -12/6 Hemoglobin A1c= 6.1 -Sensitive SSI   Multiple cutaneous injuries -LEFT diabetic foot ulcer/Surgical site; LEFT lateral 5th toe amputation  -Venous stasis: RIGHT LOWER EXTREMITY x 2 ruptured bulla, most likely related to edema -Stage 2 Pressure injury upper GLUTEAL CLEFT  with moisture component, patient incontinent of stool  -Wound care has been consulted -12/16 patient with continued diarrhea will Felexiseal  Acute blood loss anemia/Anemia of Chronic Kidney disease/Acute illness   -Anemia panel consistent with anemia of chronic disease      DVT prophylaxis: SCD Code Status: Full Family Communication: None Disposition Plan: TBD   Consultants:  Nephrology    Procedures/Significant Events:  12/3 admit to [facility] w/ sepsis due to C. Diff 12/6 transfer to [facility]  12/7 flexiseal inserted (29 day limit) 12/9 dosed w/ vitamin K 12/10 developed evidence of maroon stools - flexiseal removed  12/15 transfuse 1 unit PRBC   I have personally reviewed and interpreted all radiology studies and my findings are as above.  VENTILATOR SETTINGS:    Cultures 12/6 MRSA by PCR positive 12/6 stool C. difficile antigen positive, C. difficile toxin  positive    Antimicrobials: Anti-infectives (From admission, onward)   Start     Dose/Rate Stop   09/24/17 0030  metroNIDAZOLE (FLAGYL) IVPB 500 mg     500 mg 100 mL/hr over 60 Minutes     09/23/17 1600  metroNIDAZOLE (FLAGYL) IVPB 500 mg  Status:  Discontinued     500 mg 100 mL/hr over 60 Minutes 09/23/17 1859   09/16/17 1200  vancomycin (VANCOCIN) 50 mg/mL oral solution 500 mg     500 mg         Devices    LINES / TUBES:      Continuous Infusions: . famotidine (PEPCID) IV Stopped (09/25/17 2154)  . metronidazole Stopped (09/26/17 0100)     Objective: Vitals:   09/25/17 1539 09/25/17 2005 09/25/17 2341 09/26/17 0534  BP: (!) 100/49 (!) 103/43 (!) 109/56 (!) 115/50  Pulse: 65 66 63 64  Resp: 19 12 (!) 21 14  Temp: 98.4 F (36.9 C)  98 F (36.7 C) 98.5 F (36.9 C) 98.1 F (36.7 C)  TempSrc: Oral Oral Oral Oral  SpO2: 99% 100% 100% 94%  Weight:    226 lb 13.7 oz (102.9 kg)  Height:        Intake/Output Summary (Last 24 hours) at 09/26/2017 0731 Last data filed at 09/26/2017 0537 Gross per 24 hour  Intake 1903 ml  Output 1225 ml  Net 678 ml   Filed Weights   09/24/17 0332 09/25/17 0320 09/26/17 0534  Weight: 222 lb 14.2 oz (101.1 kg) 224 lb 6.9 oz (101.8 kg) 226 lb 13.7 oz (102.9 kg)    Physical Exam:  General:  A/O 2 (does not know when, why), extremely agitated No acute respiratory distress Neck:  Negative scars, masses, torticollis, lymphadenopathy, JVD Lungs: Clear to auscultation bilaterally without wheezes or crackles Cardiovascular: Regular rate and rhythm without murmur gallop or rub normal S1 and S2 Abdomen: negative abdominal pain, nondistended, positive soft, bowel sounds, no rebound, no ascites, no appreciable mass Extremities: No significant cyanosis, clubbing, or edema bilateral lower extremities Skin: Negative rashes, lesions, ulcers Psychiatric:  Negative depression, negative anxiety, negative fatigue, negative mania  Central  nervous system:  Cranial nerves II through XII intact, tongue/uvula midline, all extremities muscle strength 5/5, sensation intact throughout,negative dysarthria, negative expressive aphasia, negative receptive aphasia. .     Data Reviewed: Care during the described time interval was provided by me .  I have reviewed this patient's available data, including medical history, events of note, physical examination, and all test results as part of my evaluation.   CBC: Recent Labs  Lab 09/24/17 0320 09/24/17 1913 09/25/17 0245 09/25/17 1646 09/26/17 0246  WBC 19.4* 18.8* 15.7* 15.7* 13.5*  NEUTROABS  --  15.9*  --  13.3*  --   HGB 8.1* 7.9* 7.3* 8.2* 8.5*  HCT 26.0* 25.7* 23.6* 26.4* 27.3*  MCV 94.2 94.8 94.4 93.3 93.5  PLT 248 249 242 250 469   Basic Metabolic Panel: Recent Labs  Lab 09/22/17 0136 09/23/17 0444 09/24/17 0320 09/24/17 1913 09/25/17 0245 09/26/17 0246  NA 132* 133* 131* 132* 133* 134*  K 4.0 4.2 4.1 3.9 3.8 3.8  CL 93* 92* 93* 94* 95* 97*  CO2 29 31 29 28 28 28   GLUCOSE 118* 142* 230* 158* 138* 149*  BUN 113* 117* 123* 130* 126* 125*  CREATININE 3.53* 3.24* 3.59* 3.79* 3.61* 3.28*  CALCIUM 7.6* 7.6* 7.3* 7.4* 7.5* 7.5*  MG  --  2.6* 2.4 2.7* 2.5* 2.7*  PHOS 5.2* 5.5* 4.7*  --  4.6 3.9   GFR: Estimated Creatinine Clearance: 25.3 mL/min (A) (by C-G formula based on SCr of 3.28 mg/dL (H)). Liver Function Tests: Recent Labs  Lab 09/22/17 0136 09/23/17 0444 09/24/17 0320 09/25/17 0245 09/26/17 0246  ALBUMIN 1.8* 1.7* 1.6* 2.1* 1.9*   No results for input(s): LIPASE, AMYLASE in the last 168 hours. No results for input(s): AMMONIA in the last 168 hours. Coagulation Profile: Recent Labs  Lab 09/20/17 0215 09/21/17 0223 09/22/17 0136  INR 3.27 3.34 1.44   Cardiac Enzymes: No results for input(s): CKTOTAL, CKMB, CKMBINDEX, TROPONINI in the last 168 hours. BNP (last 3 results) No results for input(s): PROBNP in the last 8760 hours. HbA1C: No  results for input(s): HGBA1C in the last 72 hours. CBG: Recent Labs  Lab 09/24/17 2119 09/25/17 0805 09/25/17 1136 09/25/17 1538 09/25/17 2132  GLUCAP 179* 106* 132* 156* 186*   Lipid Profile: No results for input(s): CHOL, HDL, LDLCALC,  TRIG, CHOLHDL, LDLDIRECT in the last 72 hours. Thyroid Function Tests: No results for input(s): TSH, T4TOTAL, FREET4, T3FREE, THYROIDAB in the last 72 hours. Anemia Panel: No results for input(s): VITAMINB12, FOLATE, FERRITIN, TIBC, IRON, RETICCTPCT in the last 72 hours. Urine analysis:    Component Value Date/Time   COLORURINE YELLOW 09/16/2017 1753   APPEARANCEUR HAZY (A) 09/16/2017 1753   LABSPEC >1.030 (H) 09/16/2017 1753   PHURINE 5.0 09/16/2017 1753   GLUCOSEU NEGATIVE 09/16/2017 1753   HGBUR MODERATE (A) 09/16/2017 1753   BILIRUBINUR NEGATIVE 09/16/2017 1753   KETONESUR NEGATIVE 09/16/2017 1753   PROTEINUR NEGATIVE 09/16/2017 1753   NITRITE NEGATIVE 09/16/2017 1753   LEUKOCYTESUR NEGATIVE 09/16/2017 1753   Sepsis Labs: @LABRCNTIP (procalcitonin:4,lacticidven:4)  ) Recent Results (from the past 240 hour(s))  MRSA PCR Screening     Status: Abnormal   Collection Time: 09/16/17 10:38 AM  Result Value Ref Range Status   MRSA by PCR POSITIVE (A) NEGATIVE Final    Comment:        The GeneXpert MRSA Assay (FDA approved for NASAL specimens only), is one component of a comprehensive MRSA colonization surveillance program. It is not intended to diagnose MRSA infection nor to guide or monitor treatment for MRSA infections. RESULT CALLED TO, READ BACK BY AND VERIFIED WITH: H. Deaton RN 12:20 09/16/17 ([email or web address])   C difficile quick scan w PCR reflex     Status: Abnormal   Collection Time: 09/16/17  1:24 PM  Result Value Ref Range Status   C Diff antigen POSITIVE (A) NEGATIVE Final   C Diff toxin NEGATIVE NEGATIVE Final   C Diff interpretation Results are indeterminate. See PCR results.  Final  Clostridium Difficile by PCR     Status:  Abnormal   Collection Time: 09/16/17  1:24 PM  Result Value Ref Range Status   Toxigenic C. Difficile by PCR POSITIVE (A) NEGATIVE Final    Comment: Positive for toxigenic C. difficile with little to no toxin production. Only treat if clinical presentation suggests symptomatic illness.         Radiology Studies: No results found.      Scheduled Meds: . amiodarone  200 mg Oral BID  . atorvastatin  40 mg Oral QHS  . feeding supplement (NEPRO CARB STEADY)  237 mL Oral TID BM  . feeding supplement (PRO-STAT SUGAR FREE 64)  30 mL Oral TID BM  . ferrous sulfate  325 mg Oral Q breakfast  . hydrocortisone  25 mg Rectal BID  . insulin aspart  0-9 Units Subcutaneous TID WC  . mouth rinse  15 mL Mouth Rinse BID  . sodium chloride flush  3 mL Intravenous Q12H  . traZODone  50 mg Oral QHS  . vancomycin  500 mg Oral Q6H  . vitamin B-12  1,000 mcg Oral Daily   Continuous Infusions: . famotidine (PEPCID) IV Stopped (09/25/17 2154)  . metronidazole Stopped (09/26/17 0100)     LOS: 10 days    Time spent: 40 minutes    Muntaha Vermette, Geraldo Docker, MD [facility] Pager [phone]   If 7PM-7AM, please contact night-coverage [email or web address] Password [name] 09/26/2017, 7:31 AM

## 2017-09-27 ENCOUNTER — Inpatient Hospital Stay (HOSPITAL_COMMUNITY): Payer: Medicare Other

## 2017-09-27 ENCOUNTER — Encounter (HOSPITAL_COMMUNITY): Payer: Self-pay | Admitting: Interventional Radiology

## 2017-09-27 DIAGNOSIS — R0600 Dyspnea, unspecified: Secondary | ICD-10-CM

## 2017-09-27 DIAGNOSIS — N184 Chronic kidney disease, stage 4 (severe): Secondary | ICD-10-CM

## 2017-09-27 DIAGNOSIS — R1110 Vomiting, unspecified: Secondary | ICD-10-CM

## 2017-09-27 DIAGNOSIS — L89159 Pressure ulcer of sacral region, unspecified stage: Secondary | ICD-10-CM

## 2017-09-27 DIAGNOSIS — L97509 Non-pressure chronic ulcer of other part of unspecified foot with unspecified severity: Secondary | ICD-10-CM

## 2017-09-27 DIAGNOSIS — A0471 Enterocolitis due to Clostridium difficile, recurrent: Secondary | ICD-10-CM

## 2017-09-27 HISTORY — DX: Enterocolitis due to Clostridium difficile, recurrent: A04.71

## 2017-09-27 HISTORY — PX: IR US GUIDE VASC ACCESS RIGHT: IMG2390

## 2017-09-27 HISTORY — PX: IR FLUORO GUIDE CV LINE RIGHT: IMG2283

## 2017-09-27 LAB — GLUCOSE, CAPILLARY
GLUCOSE-CAPILLARY: 110 mg/dL — AB (ref 65–99)
Glucose-Capillary: 115 mg/dL — ABNORMAL HIGH (ref 65–99)
Glucose-Capillary: 105 mg/dL — ABNORMAL HIGH (ref 65–99)
Glucose-Capillary: 126 mg/dL — ABNORMAL HIGH (ref 65–99)

## 2017-09-27 LAB — RENAL FUNCTION PANEL
Albumin: 1.8 g/dL — ABNORMAL LOW (ref 3.5–5.0)
Anion gap: 7 (ref 5–15)
BUN: 110 mg/dL — AB (ref 6–20)
CHLORIDE: 100 mmol/L — AB (ref 101–111)
CO2: 30 mmol/L (ref 22–32)
CREATININE: 2.87 mg/dL — AB (ref 0.61–1.24)
Calcium: 7.9 mg/dL — ABNORMAL LOW (ref 8.9–10.3)
GFR calc Af Amer: 24 mL/min — ABNORMAL LOW (ref >60)
GFR, EST NON AFRICAN AMERICAN: 20 mL/min — AB (ref >60)
Glucose, Bld: 110 mg/dL — ABNORMAL HIGH (ref 65–99)
Phosphorus: 3.3 mg/dL (ref 2.5–4.6)
Potassium: 3.9 mmol/L (ref 3.5–5.1)
Sodium: 137 mmol/L (ref 135–145)

## 2017-09-27 LAB — CBC
HCT: 29.8 % — ABNORMAL LOW (ref 39.0–52.0)
Hemoglobin: 9.2 g/dL — ABNORMAL LOW (ref 13.0–17.0)
MCH: 28.8 pg (ref 26.0–34.0)
MCHC: 30.9 g/dL (ref 30.0–36.0)
MCV: 93.1 fL (ref 78.0–100.0)
PLATELETS: 314 10*3/uL (ref 150–400)
RBC: 3.2 MIL/uL — ABNORMAL LOW (ref 4.22–5.81)
RDW: 17.7 % — AB (ref 11.5–15.5)
WBC: 13 10*3/uL — AB (ref 4.0–10.5)

## 2017-09-27 LAB — MAGNESIUM: Magnesium: 2.6 mg/dL — ABNORMAL HIGH (ref 1.7–2.4)

## 2017-09-27 MED ORDER — GERHARDT'S BUTT CREAM
TOPICAL_CREAM | Freq: Two times a day (BID) | CUTANEOUS | Status: DC
  Administered 2017-09-27 – 2017-09-30 (×8): via TOPICAL
  Filled 2017-09-27: qty 1

## 2017-09-27 MED ORDER — LIDOCAINE HCL 1 % IJ SOLN
INTRAMUSCULAR | Status: AC
  Filled 2017-09-27: qty 20

## 2017-09-27 MED ORDER — LIDOCAINE HCL 1 % IJ SOLN
INTRAMUSCULAR | Status: DC | PRN
  Administered 2017-09-27: 10 mL

## 2017-09-27 NOTE — Progress Notes (Signed)
PROGRESS NOTE  Christian Garner VFI:[id number] DOB: 1946/06/27 DOA: 09/16/2017 PCP: [name], MD  HPI/Recap of past 24 hours: 71 y.o. With history of HFREF 30%,CAD s/p PTCA greater than 1 year ago, Atrial Fibrillation chronically anticoagulated with warfarin, Chronic Respiratory Failure with Hypoxia, CKD, type 2 DM who presented as a  transfer from [facility] for evaluation of acute on chronic renal failure.  Hospital course complicated by severe c-diff infection. GI and ID following.  Reported vomiting this morning. Portable abd xray ordered today 09/27/17 awaiting completion. States he hurts all over. No other specific complaints.   Assessment/Plan: Principal Problem:   Acute kidney injury superimposed on chronic kidney disease (HCC) Active Problems:   Acute on chronic combined systolic and diastolic CHF (congestive heart failure) (HCC)   AF (paroxysmal atrial fibrillation) (HCC)   Pressure injury of skin   Hyponatremia   CAD (coronary artery disease)   C. difficile colitis   Warfarin anticoagulation   Type 2 diabetes mellitus with left diabetic foot ulcer (HCC)   Anemia due to chronic kidney disease   CKD (chronic kidney disease), stage III (HCC)  AKI on CKD3 -baseline cr 2.3 -improving cr 2.87 from 3.28 -avoid nephrotoxic meds/hypotension -nephrology -BMP am  Severe C-diff colitis -Monitor stool output and symptoms -flexiceal in place -GI and ID following -might require fecal transplant if IV flagyl doesn't work -abd xray 09/27/17 pending  Acute on Chronic combined Systolic and Diastolic CHF  -strict I&O's, daily weight -lasix IV 40 mg BID -echo 03/23/17 LVEF 30-35%  HFREF 30-35% -management as stated above -telemetry  Chronic Paroxysmal Atrial fibrillation with RVR -Currently in NSR -rate controlled -holding Warfarin due to recent upper GI bleed -amiodarone  CAD -appears stable  -atorvastatin    Diabetes type 2 Controlled with  Complication/LEFT Diabetic Foot Ulcer -A1C 6.1 -ISS sensitive with hypoglycemia protocol -BMP am  Multiple cutaneous injuries -LEFT diabetic foot ulcer/Surgical site; left lateral 5th toe amputation  -Venous stasis RLE x 2 ruptured bulla, most likely related to edema -Stage 2 Pressure injury upper gluteal cleft with moisture component, patient incontinent of stool -Wound care has been consulted -persistent diarrhea will Flexiseal  Acute blood loss anemia/Anemia of Chronic Kidney disease/Acute illness  -Anemia panel consistent with anemia of chronic disease -ferrous sulfate  GERD -famotidine  Depression -trazodone    Code Status: Full   Family Communication: None at bedside  Disposition Plan: will stay another midnight to continue treatment for C-diff and possible fecal transplant.    Consultants:  GI  ID  Procedures:  None  Antimicrobials:  IV flagyl  DVT prophylaxis:  SCDs no chemical DVT ppx due to prior GI bleed   Objective: Vitals:   09/26/17 1200 09/26/17 1326 09/26/17 2107 09/27/17 0359  BP: (!) 104/39 (!) 113/52 (!) 107/48 (!) 120/53  Pulse: 69 61 69 67  Resp: 16  16 18   Temp: 98 F (36.7 C) 98.4 F (36.9 C) 98.2 F (36.8 C) 98.6 F (37 C)  TempSrc: Oral Oral Oral Oral  SpO2: 98% 98% 98% 99%  Weight:      Height:        Intake/Output Summary (Last 24 hours) at 09/27/2017 0825 Last data filed at 09/27/2017 0407 Gross per 24 hour  Intake 560 ml  Output 1650 ml  Net -1090 ml   Filed Weights   09/24/17 0332 09/25/17 0320 09/26/17 0534  Weight: 101.1 kg (222 lb 14.2 oz) 101.8 kg (224 lb 6.9 oz) 102.9 kg (226 lb  13.7 oz)    Exam:  General: 71 yo CM WD WN NAD. A&O 3 Neck: Negative masses, lymphadenopathy, right IJ Lungs:Clear to auscultation bilaterally without wheezes or crackles Cardiovascular:Regular rate and rhythm without murmur gallop or rub normal S1 and S2 Abdomen: negative abdominal pain, nondistended, positive  soft, normal bowel sounds, no rebound, no ascites, no appreciable mass Extremities:No significant cyanosis, clubbing, or edema bilateral lower extremities MSK: non focal, no atrophy 2/4 pulses in all 4.   Data Reviewed: CBC: Recent Labs  Lab 09/24/17 1913 09/25/17 0245 09/25/17 1646 09/26/17 0246 09/27/17 0541  WBC 18.8* 15.7* 15.7* 13.5* 13.0*  NEUTROABS 15.9*  --  13.3*  --   --   HGB 7.9* 7.3* 8.2* 8.5* 9.2*  HCT 25.7* 23.6* 26.4* 27.3* 29.8*  MCV 94.8 94.4 93.3 93.5 93.1  PLT 249 242 250 250 010   Basic Metabolic Panel: Recent Labs  Lab 09/23/17 0444 09/24/17 0320 09/24/17 1913 09/25/17 0245 09/26/17 0246 09/27/17 0541  NA 133* 131* 132* 133* 134* 137  K 4.2 4.1 3.9 3.8 3.8 3.9  CL 92* 93* 94* 95* 97* 100*  CO2 31 29 28 28 28 30   GLUCOSE 142* 230* 158* 138* 149* 110*  BUN 117* 123* 130* 126* 125* 110*  CREATININE 3.24* 3.59* 3.79* 3.61* 3.28* 2.87*  CALCIUM 7.6* 7.3* 7.4* 7.5* 7.5* 7.9*  MG 2.6* 2.4 2.7* 2.5* 2.7* 2.6*  PHOS 5.5* 4.7*  --  4.6 3.9 3.3   GFR: Estimated Creatinine Clearance: 28.9 mL/min (A) (by C-G formula based on SCr of 2.87 mg/dL (H)). Liver Function Tests: Recent Labs  Lab 09/23/17 0444 09/24/17 0320 09/25/17 0245 09/26/17 0246 09/27/17 0541  ALBUMIN 1.7* 1.6* 2.1* 1.9* 1.8*   No results for input(s): LIPASE, AMYLASE in the last 168 hours. No results for input(s): AMMONIA in the last 168 hours. Coagulation Profile: Recent Labs  Lab 09/21/17 0223 09/22/17 0136  INR 3.34 1.44   Cardiac Enzymes: No results for input(s): CKTOTAL, CKMB, CKMBINDEX, TROPONINI in the last 168 hours. BNP (last 3 results) No results for input(s): PROBNP in the last 8760 hours. HbA1C: No results for input(s): HGBA1C in the last 72 hours. CBG: Recent Labs  Lab 09/26/17 1108 09/26/17 1332 09/26/17 1653 09/26/17 2114 09/27/17 0718  GLUCAP 186* 165* 183* 129* 115*   Lipid Profile: No results for input(s): CHOL, HDL, LDLCALC, TRIG, CHOLHDL,  LDLDIRECT in the last 72 hours. Thyroid Function Tests: No results for input(s): TSH, T4TOTAL, FREET4, T3FREE, THYROIDAB in the last 72 hours. Anemia Panel: No results for input(s): VITAMINB12, FOLATE, FERRITIN, TIBC, IRON, RETICCTPCT in the last 72 hours. Urine analysis:    Component Value Date/Time   COLORURINE YELLOW 09/16/2017 1753   APPEARANCEUR HAZY (A) 09/16/2017 1753   LABSPEC >1.030 (H) 09/16/2017 1753   PHURINE 5.0 09/16/2017 1753   GLUCOSEU NEGATIVE 09/16/2017 1753   HGBUR MODERATE (A) 09/16/2017 1753   BILIRUBINUR NEGATIVE 09/16/2017 1753   KETONESUR NEGATIVE 09/16/2017 1753   PROTEINUR NEGATIVE 09/16/2017 1753   NITRITE NEGATIVE 09/16/2017 1753   LEUKOCYTESUR NEGATIVE 09/16/2017 1753   Sepsis Labs: @LABRCNTIP (procalcitonin:4,lacticidven:4)  )No results found for this or any previous visit (from the past 240 hour(s)).    Studies: No results found.  Scheduled Meds: . amiodarone  200 mg Oral BID  . atorvastatin  40 mg Oral QHS  . famotidine  20 mg Oral Daily  . feeding supplement (NEPRO CARB STEADY)  237 mL Oral TID BM  . feeding supplement (PRO-STAT SUGAR FREE 64)  30 mL Oral TID BM  . ferrous sulfate  325 mg Oral Q breakfast  . furosemide  40 mg Intravenous BID  . hydrocortisone  25 mg Rectal BID  . insulin aspart  0-9 Units Subcutaneous TID WC  . mouth rinse  15 mL Mouth Rinse BID  . sodium chloride flush  3 mL Intravenous Q12H  . traZODone  50 mg Oral QHS  . vancomycin  500 mg Oral Q6H  . vitamin B-12  1,000 mcg Oral Daily    Continuous Infusions: . metronidazole Stopped (09/27/17 0249)     LOS: 11 days     Kayleen Memos, MD [facility] Pager [phone]  If 7PM-7AM, please contact night-coverage [email or web address] Password [email or web address] 09/27/2017, 8:25 AM

## 2017-09-27 NOTE — Progress Notes (Signed)
   Patient Name: Christian Garner Date of Encounter: 09/27/2017, 4:05 PM    Subjective  Less stools/amount per patient and RN "I need rest"     Objective  BP (!) 120/53 (BP Location: Right Arm)   Pulse 67   Temp 98.6 F (37 C) (Oral)   Resp 18   Ht 6' (1.829 m) Comment: estimate d/t pt unable to answer  Wt 226 lb 13.7 oz (102.9 kg)   SpO2 99%   BMI 30.77 kg/m  Chronically ill abd soft NT, mildly protuberant but benign  RN reports lower volume of stools and no bleeding Not exactly quanmtified WBC 13 Hgb 9.2  Assessment and Plan  Sever C diff with some prior minimal GI bleeding  Improving slowly  Would follow ID recommendations for treatment  Should he not respond to medication fecal transplant is an option - it has been done 1x in inpatient but I would not pull trigger yet and hope we do not have to.  Please call back if any ?  Signing off  Gatha Mayer, MD, [facility] [phone] (pager) 09/27/2017 4:05 PM

## 2017-09-27 NOTE — Progress Notes (Signed)
Subjective:  Nausea with vomiting   Antibiotics:  Anti-infectives (From admission, onward)   Start     Dose/Rate Route Frequency Ordered Stop   09/24/17 0030  metroNIDAZOLE (FLAGYL) IVPB 500 mg     500 mg 100 mL/hr over 60 Minutes Intravenous Every 8 hours 09/23/17 1859     09/23/17 1600  metroNIDAZOLE (FLAGYL) IVPB 500 mg  Status:  Discontinued     500 mg 100 mL/hr over 60 Minutes Intravenous Every 6 hours 09/23/17 1517 09/23/17 1859   09/16/17 1200  vancomycin (VANCOCIN) 50 mg/mL oral solution 500 mg     500 mg Oral Every 6 hours 09/16/17 0937        Medications: Scheduled Meds: . amiodarone  200 mg Oral BID  . atorvastatin  40 mg Oral QHS  . famotidine  20 mg Oral Daily  . feeding supplement (NEPRO CARB STEADY)  237 mL Oral TID BM  . feeding supplement (PRO-STAT SUGAR FREE 64)  30 mL Oral TID BM  . ferrous sulfate  325 mg Oral Q breakfast  . furosemide  40 mg Intravenous BID  . Gerhardt's butt cream   Topical BID  . hydrocortisone  25 mg Rectal BID  . insulin aspart  0-9 Units Subcutaneous TID WC  . lidocaine      . mouth rinse  15 mL Mouth Rinse BID  . sodium chloride flush  3 mL Intravenous Q12H  . traZODone  50 mg Oral QHS  . vancomycin  500 mg Oral Q6H  . vitamin B-12  1,000 mcg Oral Daily   Continuous Infusions: . metronidazole 500 mg (09/27/17 1749)   PRN Meds:.acetaminophen **OR** acetaminophen, calcium carbonate, chlorproMAZINE (THORAZINE) injection, fluticasone, lidocaine, nitroGLYCERIN, ondansetron **OR** ondansetron (ZOFRAN) IV, oxyCODONE, sodium chloride    Objective: Weight change:   Intake/Output Summary (Last 24 hours) at 09/27/2017 1755 Last data filed at 09/27/2017 1300 Gross per 24 hour  Intake 500 ml  Output 1750 ml  Net -1250 ml   Blood pressure (!) 120/53, pulse 67, temperature 98.6 F (37 C), temperature source Oral, resp. rate 18, height 6' (1.829 m), weight 226 lb 13.7 oz (102.9 kg), SpO2 99 %. Temp:  [98.2 F (36.8  C)-98.6 F (37 C)] 98.6 F (37 C) (12/17 0359) Pulse Rate:  [67-69] 67 (12/17 0359) Resp:  [16-18] 18 (12/17 0359) BP: (107-120)/(48-53) 120/53 (12/17 0359) SpO2:  [98 %-99 %] 99 % (12/17 0359)  Physical Exam: General: Alert and awake, confused HEENT:  EOMI CVS regular rate, no mgr Chest: clear to auscultation bilaterally, no wheezing, rales or rhonchi Abdomen: soft  Tender diffusely Extremities: skin: did not take down dressings  Neuro: nonfocal  CBC:  CBC Latest Ref Rng & Units 09/27/2017 09/26/2017 09/25/2017  WBC 4.0 - 10.5 K/uL 13.0(H) 13.5(H) 15.7(H)  Hemoglobin 13.0 - 17.0 g/dL 9.2(L) 8.5(L) 8.2(L)  Hematocrit 39.0 - 52.0 % 29.8(L) 27.3(L) 26.4(L)  Platelets 150 - 400 K/uL 314 250 250      BMET Recent Labs    09/26/17 0246 09/27/17 0541  NA 134* 137  K 3.8 3.9  CL 97* 100*  CO2 28 30  GLUCOSE 149* 110*  BUN 125* 110*  CREATININE 3.28* 2.87*  CALCIUM 7.5* 7.9*     Liver Panel  Recent Labs    09/26/17 0246 09/27/17 0541  ALBUMIN 1.9* 1.8*       Sedimentation Rate No results for input(s): ESRSEDRATE in the last 72 hours. C-Reactive Protein No results for  input(s): CRP in the last 72 hours.  Micro Results: Recent Results (from the past 720 hour(s))  MRSA PCR Screening     Status: Abnormal   Collection Time: 09/16/17 10:38 AM  Result Value Ref Range Status   MRSA by PCR POSITIVE (A) NEGATIVE Final    Comment:        The GeneXpert MRSA Assay (FDA approved for NASAL specimens only), is one component of a comprehensive MRSA colonization surveillance program. It is not intended to diagnose MRSA infection nor to guide or monitor treatment for MRSA infections. RESULT CALLED TO, READ BACK BY AND VERIFIED WITH: H. Deaton RN 12:20 09/16/17 ([email or web address])   C difficile quick scan w PCR reflex     Status: Abnormal   Collection Time: 09/16/17  1:24 PM  Result Value Ref Range Status   C Diff antigen POSITIVE (A) NEGATIVE Final   C Diff toxin  NEGATIVE NEGATIVE Final   C Diff interpretation Results are indeterminate. See PCR results.  Final  Clostridium Difficile by PCR     Status: Abnormal   Collection Time: 09/16/17  1:24 PM  Result Value Ref Range Status   Toxigenic C. Difficile by PCR POSITIVE (A) NEGATIVE Final    Comment: Positive for toxigenic C. difficile with little to no toxin production. Only treat if clinical presentation suggests symptomatic illness.    Studies/Results: Ir Fluoro Guide Cv Line Right  Result Date: 09/27/2017 INDICATION: History of chronic kidney disease and poor venous access. In need of durable intravenous access for medication administration and blood draws. EXAM: TUNNELED PICC LINE WITH ULTRASOUND AND FLUOROSCOPIC GUIDANCE MEDICATIONS: Patient is currently admitted to the hospital and receiving intravenous antibiotics. The antibiotic was given in an appropriate time interval prior to skin puncture. ANESTHESIA/SEDATION: None FLUOROSCOPY TIME:  18 seconds (2 mGy) COMPLICATIONS: None immediate. PROCEDURE: Informed written consent was obtained from the patient's wife after a discussion of the risks, benefits, and alternatives to treatment. Questions regarding the procedure were encouraged and answered. The right neck and chest were prepped with chlorhexidine in a sterile fashion, and a sterile drape was applied covering the operative field. Maximum barrier sterile technique with sterile gowns and gloves were used for the procedure. A timeout was performed prior to the initiation of the procedure. After the overlying soft tissues were anesthetized, a small venotomy incision was created and a micropuncture kit was utilized to access the internal jugular vein. Real-time ultrasound guidance was utilized for vascular access including the acquisition of a permanent ultrasound image documenting patency of the accessed vessel. The microwire was utilized to measure appropriate catheter length. The micropuncture sheath was  exchanged for a peel-away sheath over a guidewire. A 5 French dual lumen tunneled PICC measuring 24 cm was tunneled in a retrograde fashion from the anterior chest wall to the venotomy incision. The catheter was then placed through the peel-away sheath with tip ultimately positioned at the superior caval-atrial junction. Final catheter positioning was confirmed and documented with a spot radiographic image. The catheter aspirates and flushes normally. The catheter was flushed with appropriate volume heparin dwells. The catheter exit site was secured with a 0-Prolene retention suture. The venotomy incision was closed with Dermabond and Steri-strips. Dressings were applied. The patient tolerated the procedure well without immediate post procedural complication. FINDINGS: After catheter placement, the tip lies within the superior cavoatrial junction. The catheter aspirates and flushes normally and is ready for immediate use. IMPRESSION: Successful placement of 24 cm dual lumen tunneled PICC catheter via the  right internal jugular vein with tip terminating at the superior caval atrial junction. The catheter is ready for immediate use. Electronically Signed   By: Sandi Mariscal M.D.   On: 09/27/2017 10:02   Ir US Guide Vasc Access Right  Result Date: 09/27/2017 INDICATION: History of chronic kidney disease and poor venous access. In need of durable intravenous access for medication administration and blood draws. EXAM: TUNNELED PICC LINE WITH ULTRASOUND AND FLUOROSCOPIC GUIDANCE MEDICATIONS: Patient is currently admitted to the hospital and receiving intravenous antibiotics. The antibiotic was given in an appropriate time interval prior to skin puncture. ANESTHESIA/SEDATION: None FLUOROSCOPY TIME:  18 seconds (2 mGy) COMPLICATIONS: None immediate. PROCEDURE: Informed written consent was obtained from the patient's wife after a discussion of the risks, benefits, and alternatives to treatment. Questions regarding the  procedure were encouraged and answered. The right neck and chest were prepped with chlorhexidine in a sterile fashion, and a sterile drape was applied covering the operative field. Maximum barrier sterile technique with sterile gowns and gloves were used for the procedure. A timeout was performed prior to the initiation of the procedure. After the overlying soft tissues were anesthetized, a small venotomy incision was created and a micropuncture kit was utilized to access the internal jugular vein. Real-time ultrasound guidance was utilized for vascular access including the acquisition of a permanent ultrasound image documenting patency of the accessed vessel. The microwire was utilized to measure appropriate catheter length. The micropuncture sheath was exchanged for a peel-away sheath over a guidewire. A 5 French dual lumen tunneled PICC measuring 24 cm was tunneled in a retrograde fashion from the anterior chest wall to the venotomy incision. The catheter was then placed through the peel-away sheath with tip ultimately positioned at the superior caval-atrial junction. Final catheter positioning was confirmed and documented with a spot radiographic image. The catheter aspirates and flushes normally. The catheter was flushed with appropriate volume heparin dwells. The catheter exit site was secured with a 0-Prolene retention suture. The venotomy incision was closed with Dermabond and Steri-strips. Dressings were applied. The patient tolerated the procedure well without immediate post procedural complication. FINDINGS: After catheter placement, the tip lies within the superior cavoatrial junction. The catheter aspirates and flushes normally and is ready for immediate use. IMPRESSION: Successful placement of 24 cm dual lumen tunneled PICC catheter via the right internal jugular vein with tip terminating at the superior caval atrial junction. The catheter is ready for immediate use. Electronically Signed   By: Sandi Mariscal M.D.   On: 09/27/2017 10:02      Assessment/Plan:  INTERVAL HISTORY: pt with nausea and episode of dry heaving vomiting when I was in the room   Principal Problem:   Acute kidney injury superimposed on chronic kidney disease ([place]) Active Problems:   Acute on chronic combined systolic and diastolic CHF (congestive heart failure) (HCC)   AF (paroxysmal atrial fibrillation) (HCC)   Pressure injury of skin   Hyponatremia   CAD (coronary artery disease)   C. difficile colitis   Warfarin anticoagulation   Type 2 diabetes mellitus with left diabetic foot ulcer (HCC)   Anemia due to chronic kidney disease   CKD (chronic kidney disease), stage III ([place])    Christian Garner is a 71 y.o. male with severe CDI, at least 2nd episode  #1 Severe CDI:  Ensure he does not have ileus given his nausea and vomiting checking portable xray abdomen  Continue vancomycin pulse and taper Continue flagyl IV for now  Dr. Carlean Purl states that he has performed fecal transplant in the hospital once before  I would avoid unnecessary antibiotics  #2 DFU and sacral decubitus ulcers: DO NOT treat with systemic abx as will worsen #1. If he becomes acutely illl from DFU I would favor surgical cure if possible  #3 Goals of care: would encourage this and also consult with palliative care.    LOS: 11 days   Alcide Evener 09/27/2017, 5:55 PM

## 2017-09-27 NOTE — Social Work (Addendum)
CSW spoke with pt wife on the phone, relayed message regarding the need for 60 days to have lapsed before they are able to take pt back at [name], per [name], [name].   Pt wife states that "pt is a very sick man and I don't want him going anywhere but [facility], I live across the street."   CSW informed wife that pt is now on day 43 of 60 and in case they discharged before then she would either have to private pay or pick a back up facility (pt has other offers from area SNF's). Pt wife very adamant that pt will be here for an extended period, and that CSW should "just check back in" when medical care team has given CSW more information.   CSW will follow up with care team and continue to follow to support discharge when medically appropriate.   Alexander Mt, [place] Work [phone]

## 2017-09-27 NOTE — Progress Notes (Signed)
PT Cancellation Note  Patient Details Name: Christian Garner MRN: [id number] DOB: 1946-06-29   Cancelled Treatment:    Reason Eval/Treat Not Completed: Patient declined, no reason specified PT will continue to follow acutely.   [place] April, PTA Pager: [phone]   09/27/2017, 3:13 PM

## 2017-09-27 NOTE — Consult Note (Signed)
[place] Nurse wound consult note Reason for Consult:Seen on 12/6.  reconsulted for MASD to sacrum.  Positive for severe C-Diff Wound type:Moisture associated skin damage (incontinence) Pressure Injury POA: NA Measurement: 2 cm x 1 cm x 0.2 cm  Wound NZD:KEUV and mosit Drainage (amount, consistency, odor) scant weeping Periwound:intact Dressing procedure/placement/frequency:Continue silicone foam to legs per order.  Begin Gerhardt butt paste twice daily.  Will not follow at this time.  Please re-consult if needed.  Domenic Moras RN BSN [place] Pager [phone]

## 2017-09-27 NOTE — Procedures (Signed)
Pre procedural Diagnosis: Poor venous access Post Procedural Diagnosis: Same  Successful placement of right IJ approach tunneled 24 cm dual lumen PICC line with tip at the superior caval-atrial junction.    EBL: Trace  No immediate post procedural complication.  The PICC line is ready for immediate use.  Ronny Bacon, MD Pager #: [phone]

## 2017-09-28 ENCOUNTER — Inpatient Hospital Stay (HOSPITAL_COMMUNITY): Payer: Medicare Other

## 2017-09-28 DIAGNOSIS — L97509 Non-pressure chronic ulcer of other part of unspecified foot with unspecified severity: Secondary | ICD-10-CM

## 2017-09-28 DIAGNOSIS — E871 Hypo-osmolality and hyponatremia: Secondary | ICD-10-CM

## 2017-09-28 DIAGNOSIS — N184 Chronic kidney disease, stage 4 (severe): Secondary | ICD-10-CM

## 2017-09-28 DIAGNOSIS — E11621 Type 2 diabetes mellitus with foot ulcer: Secondary | ICD-10-CM

## 2017-09-28 LAB — RENAL FUNCTION PANEL
ALBUMIN: 2 g/dL — AB (ref 3.5–5.0)
ANION GAP: 5 (ref 5–15)
BUN: 99 mg/dL — ABNORMAL HIGH (ref 6–20)
CALCIUM: 7.8 mg/dL — AB (ref 8.9–10.3)
CO2: 30 mmol/L (ref 22–32)
Chloride: 103 mmol/L (ref 101–111)
Creatinine, Ser: 2.69 mg/dL — ABNORMAL HIGH (ref 0.61–1.24)
GFR, EST AFRICAN AMERICAN: 26 mL/min — AB (ref >60)
GFR, EST NON AFRICAN AMERICAN: 22 mL/min — AB (ref >60)
Glucose, Bld: 119 mg/dL — ABNORMAL HIGH (ref 65–99)
PHOSPHORUS: 3.7 mg/dL (ref 2.5–4.6)
POTASSIUM: 4.2 mmol/L (ref 3.5–5.1)
SODIUM: 138 mmol/L (ref 135–145)

## 2017-09-28 LAB — CBC
HCT: 29.3 % — ABNORMAL LOW (ref 39.0–52.0)
HEMOGLOBIN: 9.2 g/dL — AB (ref 13.0–17.0)
MCH: 29.7 pg (ref 26.0–34.0)
MCHC: 31.4 g/dL (ref 30.0–36.0)
MCV: 94.5 fL (ref 78.0–100.0)
Platelets: 315 10*3/uL (ref 150–400)
RBC: 3.1 MIL/uL — ABNORMAL LOW (ref 4.22–5.81)
RDW: 17.6 % — ABNORMAL HIGH (ref 11.5–15.5)
WBC: 13.1 10*3/uL — AB (ref 4.0–10.5)

## 2017-09-28 LAB — MAGNESIUM: MAGNESIUM: 2.6 mg/dL — AB (ref 1.7–2.4)

## 2017-09-28 LAB — GLUCOSE, CAPILLARY
GLUCOSE-CAPILLARY: 182 mg/dL — AB (ref 65–99)
GLUCOSE-CAPILLARY: 157 mg/dL — AB (ref 65–99)
Glucose-Capillary: 117 mg/dL — ABNORMAL HIGH (ref 65–99)

## 2017-09-28 LAB — PROTIME-INR
INR: 2.19
Prothrombin Time: 24.2 seconds — ABNORMAL HIGH (ref 11.4–15.2)

## 2017-09-28 MED ORDER — FIDAXOMICIN 200 MG PO TABS
200.0000 mg | ORAL_TABLET | Freq: Two times a day (BID) | ORAL | Status: DC
  Administered 2017-09-28 – 2017-10-01 (×7): 200 mg via ORAL
  Filled 2017-09-28 (×8): qty 1

## 2017-09-28 MED ORDER — WARFARIN - PHARMACIST DOSING INPATIENT
Freq: Every day | Status: DC
  Administered 2017-09-28: 18:00:00

## 2017-09-28 MED ORDER — WARFARIN SODIUM 4 MG PO TABS
4.0000 mg | ORAL_TABLET | Freq: Once | ORAL | Status: AC
  Administered 2017-09-28: 4 mg via ORAL
  Filled 2017-09-28: qty 1

## 2017-09-28 NOTE — Care Management (Addendum)
[place] called back, information will have to come from ID. Awaiting today's note. SW aware. Magdalen Spatz RN BSN [phone]    Consult for fidaxomicin at discharge. Spoke with Anderson Malta in pharmacy . Will need to know dose frequency and duration of medication. Anderson Malta will call back with that information. Patient plan is SNF at present , will provide SW with information.   Magdalen Spatz RN BSN [phone]

## 2017-09-28 NOTE — Progress Notes (Signed)
Rectal tube came out.  Attempted to reinsert it.  Pt asked to leave it out for a few hours to see if it is still needed. Pt is to notify us when having a bm.  Will continue monitoring and re assess the need for the rectal tube.

## 2017-09-28 NOTE — Progress Notes (Signed)
Subjective:  He wants me to help  Him with his DFU   Antibiotics:  Anti-infectives (From admission, onward)   Start     Dose/Rate Route Frequency Ordered Stop   09/28/17 1100  fidaxomicin (DIFICID) tablet 200 mg     200 mg Oral 2 times daily 09/28/17 1027     09/24/17 0030  metroNIDAZOLE (FLAGYL) IVPB 500 mg     500 mg 100 mL/hr over 60 Minutes Intravenous Every 8 hours 09/23/17 1859     09/23/17 1600  metroNIDAZOLE (FLAGYL) IVPB 500 mg  Status:  Discontinued     500 mg 100 mL/hr over 60 Minutes Intravenous Every 6 hours 09/23/17 1517 09/23/17 1859   09/16/17 1200  vancomycin (VANCOCIN) 50 mg/mL oral solution 500 mg  Status:  Discontinued     500 mg Oral Every 6 hours 09/16/17 0937 09/28/17 1027      Medications: Scheduled Meds: . amiodarone  200 mg Oral BID  . atorvastatin  40 mg Oral QHS  . ferrous sulfate  325 mg Oral Q breakfast  . fidaxomicin  200 mg Oral BID  . furosemide  40 mg Intravenous BID  . Gerhardt's butt cream   Topical BID  . hydrocortisone  25 mg Rectal BID  . insulin aspart  0-9 Units Subcutaneous TID WC  . mouth rinse  15 mL Mouth Rinse BID  . sodium chloride flush  3 mL Intravenous Q12H  . traZODone  50 mg Oral QHS  . vitamin B-12  1,000 mcg Oral Daily  . Warfarin - Pharmacist Dosing Inpatient   Does not apply q1800   Continuous Infusions: . metronidazole 500 mg (09/28/17 1615)   PRN Meds:.acetaminophen **OR** acetaminophen, calcium carbonate, chlorproMAZINE (THORAZINE) injection, fluticasone, lidocaine, nitroGLYCERIN, ondansetron **OR** ondansetron (ZOFRAN) IV, oxyCODONE, sodium chloride    Objective: Weight change:   Intake/Output Summary (Last 24 hours) at 09/28/2017 2122 Last data filed at 09/28/2017 2120 Gross per 24 hour  Intake 840 ml  Output 2225 ml  Net -1385 ml   Blood pressure (!) 115/58, pulse 65, temperature 98.3 F (36.8 C), temperature source Oral, resp. rate 19, height 6' (1.829 m), weight 226 lb 13.7 oz (102.9  kg), SpO2 95 %. Temp:  [98.2 F (36.8 C)-98.3 F (36.8 C)] 98.3 F (36.8 C) (12/18 2118) Pulse Rate:  [63-72] 65 (12/18 2118) Resp:  [18-20] 19 (12/18 2118) BP: (108-115)/(49-58) 115/58 (12/18 2118) SpO2:  [95 %-97 %] 95 % (12/18 2118) FiO2 (%):  [2 %] 2 % (12/18 1332)  Physical Exam: General: Alert and awake, confused agitated HEENT:  EOMI CVS regular rate, no mgr Chest: clear to auscultation bilaterally, no wheezing, rales or rhonchi Abdomen: soft  Tender diffusely Extremities: skin:  DFU  09/28/17:      Neuro: nonfocal  CBC:  CBC Latest Ref Rng & Units 09/28/2017 09/27/2017 09/26/2017  WBC 4.0 - 10.5 K/uL 13.1(H) 13.0(H) 13.5(H)  Hemoglobin 13.0 - 17.0 g/dL 9.2(L) 9.2(L) 8.5(L)  Hematocrit 39.0 - 52.0 % 29.3(L) 29.8(L) 27.3(L)  Platelets 150 - 400 K/uL 315 314 250      BMET Recent Labs    09/27/17 0541 09/28/17 0537  NA 137 138  K 3.9 4.2  CL 100* 103  CO2 30 30  GLUCOSE 110* 119*  BUN 110* 99*  CREATININE 2.87* 2.69*  CALCIUM 7.9* 7.8*     Liver Panel  Recent Labs    09/27/17 0541 09/28/17 0537  ALBUMIN 1.8* 2.0*  Sedimentation Rate No results for input(s): ESRSEDRATE in the last 72 hours. C-Reactive Protein No results for input(s): CRP in the last 72 hours.  Micro Results: Recent Results (from the past 720 hour(s))  MRSA PCR Screening     Status: Abnormal   Collection Time: 09/16/17 10:38 AM  Result Value Ref Range Status   MRSA by PCR POSITIVE (A) NEGATIVE Final    Comment:        The GeneXpert MRSA Assay (FDA approved for NASAL specimens only), is one component of a comprehensive MRSA colonization surveillance program. It is not intended to diagnose MRSA infection nor to guide or monitor treatment for MRSA infections. RESULT CALLED TO, READ BACK BY AND VERIFIED WITH: H. Deaton RN 12:20 09/16/17 ([email or web address])   C difficile quick scan w PCR reflex     Status: Abnormal   Collection Time: 09/16/17  1:24 PM  Result Value  Ref Range Status   C Diff antigen POSITIVE (A) NEGATIVE Final   C Diff toxin NEGATIVE NEGATIVE Final   C Diff interpretation Results are indeterminate. See PCR results.  Final  Clostridium Difficile by PCR     Status: Abnormal   Collection Time: 09/16/17  1:24 PM  Result Value Ref Range Status   Toxigenic C. Difficile by PCR POSITIVE (A) NEGATIVE Final    Comment: Positive for toxigenic C. difficile with little to no toxin production. Only treat if clinical presentation suggests symptomatic illness.    Studies/Results: Dg Abd 1 View  Result Date: 09/28/2017 CLINICAL DATA:  Abdominal pain and vomiting tonight. EXAM: ABDOMEN - 1 VIEW COMPARISON:  Radiograph 09/23/2017. FINDINGS: No bowel dilatation to suggest obstruction. Previous colonic edema is not visualized on the current exam. No evidence of free air on supine views. There are vascular calcifications. Pelvic calcifications are prostatic. IMPRESSION: Colonic edema on prior radiographs is not visualized. No bowel dilatation. No evidence of free air. Electronically Signed   By: Jeb Levering M.D.   On: 09/28/2017 01:14   Ir Fluoro Guide Cv Line Right  Result Date: 09/27/2017 INDICATION: History of chronic kidney disease and poor venous access. In need of durable intravenous access for medication administration and blood draws. EXAM: TUNNELED PICC LINE WITH ULTRASOUND AND FLUOROSCOPIC GUIDANCE MEDICATIONS: Patient is currently admitted to the hospital and receiving intravenous antibiotics. The antibiotic was given in an appropriate time interval prior to skin puncture. ANESTHESIA/SEDATION: None FLUOROSCOPY TIME:  18 seconds (2 mGy) COMPLICATIONS: None immediate. PROCEDURE: Informed written consent was obtained from the patient's wife after a discussion of the risks, benefits, and alternatives to treatment. Questions regarding the procedure were encouraged and answered. The right neck and chest were prepped with chlorhexidine in a sterile  fashion, and a sterile drape was applied covering the operative field. Maximum barrier sterile technique with sterile gowns and gloves were used for the procedure. A timeout was performed prior to the initiation of the procedure. After the overlying soft tissues were anesthetized, a small venotomy incision was created and a micropuncture kit was utilized to access the internal jugular vein. Real-time ultrasound guidance was utilized for vascular access including the acquisition of a permanent ultrasound image documenting patency of the accessed vessel. The microwire was utilized to measure appropriate catheter length. The micropuncture sheath was exchanged for a peel-away sheath over a guidewire. A 5 French dual lumen tunneled PICC measuring 24 cm was tunneled in a retrograde fashion from the anterior chest wall to the venotomy incision. The catheter was then placed through the peel-away  sheath with tip ultimately positioned at the superior caval-atrial junction. Final catheter positioning was confirmed and documented with a spot radiographic image. The catheter aspirates and flushes normally. The catheter was flushed with appropriate volume heparin dwells. The catheter exit site was secured with a 0-Prolene retention suture. The venotomy incision was closed with Dermabond and Steri-strips. Dressings were applied. The patient tolerated the procedure well without immediate post procedural complication. FINDINGS: After catheter placement, the tip lies within the superior cavoatrial junction. The catheter aspirates and flushes normally and is ready for immediate use. IMPRESSION: Successful placement of 24 cm dual lumen tunneled PICC catheter via the right internal jugular vein with tip terminating at the superior caval atrial junction. The catheter is ready for immediate use. Electronically Signed   By: Sandi Mariscal M.D.   On: 09/27/2017 10:02   Ir US Guide Vasc Access Right  Result Date: 09/27/2017 INDICATION:  History of chronic kidney disease and poor venous access. In need of durable intravenous access for medication administration and blood draws. EXAM: TUNNELED PICC LINE WITH ULTRASOUND AND FLUOROSCOPIC GUIDANCE MEDICATIONS: Patient is currently admitted to the hospital and receiving intravenous antibiotics. The antibiotic was given in an appropriate time interval prior to skin puncture. ANESTHESIA/SEDATION: None FLUOROSCOPY TIME:  18 seconds (2 mGy) COMPLICATIONS: None immediate. PROCEDURE: Informed written consent was obtained from the patient's wife after a discussion of the risks, benefits, and alternatives to treatment. Questions regarding the procedure were encouraged and answered. The right neck and chest were prepped with chlorhexidine in a sterile fashion, and a sterile drape was applied covering the operative field. Maximum barrier sterile technique with sterile gowns and gloves were used for the procedure. A timeout was performed prior to the initiation of the procedure. After the overlying soft tissues were anesthetized, a small venotomy incision was created and a micropuncture kit was utilized to access the internal jugular vein. Real-time ultrasound guidance was utilized for vascular access including the acquisition of a permanent ultrasound image documenting patency of the accessed vessel. The microwire was utilized to measure appropriate catheter length. The micropuncture sheath was exchanged for a peel-away sheath over a guidewire. A 5 French dual lumen tunneled PICC measuring 24 cm was tunneled in a retrograde fashion from the anterior chest wall to the venotomy incision. The catheter was then placed through the peel-away sheath with tip ultimately positioned at the superior caval-atrial junction. Final catheter positioning was confirmed and documented with a spot radiographic image. The catheter aspirates and flushes normally. The catheter was flushed with appropriate volume heparin dwells. The  catheter exit site was secured with a 0-Prolene retention suture. The venotomy incision was closed with Dermabond and Steri-strips. Dressings were applied. The patient tolerated the procedure well without immediate post procedural complication. FINDINGS: After catheter placement, the tip lies within the superior cavoatrial junction. The catheter aspirates and flushes normally and is ready for immediate use. IMPRESSION: Successful placement of 24 cm dual lumen tunneled PICC catheter via the right internal jugular vein with tip terminating at the superior caval atrial junction. The catheter is ready for immediate use. Electronically Signed   By: Sandi Mariscal M.D.   On: 09/27/2017 10:02   Dg Foot 2 Views Left  Result Date: 09/28/2017 CLINICAL DATA:  Possible diabetic foot ulcer. The patient has undergone amputation of the fifth toe. EXAM: LEFT FOOT - 2 VIEW COMPARISON:  Rule left foot series dated April 07, 2017 FINDINGS: The patient is undergone removal of the fifth toe and distal 1/2  of the fifth metatarsal. The remaining bones are subjectively osteopenic. No definite loss of the sharp cortical margins of any of the toes or metatarsals is observed. The stump of the fifth metatarsal is grossly intact. There is chronic deformity of the base of the fourth proximal phalanx. The bones of the hindfoot are grossly intact. There are plantar and Achilles region calcaneal spurs. There is mild soft tissue swelling over the dorsum of the forefoot. There may be gas within the soft tissues over the lateral aspect of the metatarsal region adjacent to the fourth metatarsal. IMPRESSION: No objective evidence of acute or chronic osteomyelitis. Postsurgical changes of the fifth ray. Chronic deformity of the base of the proximal phalanx of the fourth toe. Probable cellulitis of the lateral aspect of the metatarsal region. Electronically Signed   By: David  Martinique M.D.   On: 09/28/2017 11:14      Assessment/Plan:  INTERVAL  HISTORY: pt with nausea and episode of dry heaving vomiting when I was in the room   Principal Problem:   Recurrent Clostridium difficile diarrhea Active Problems:   Acute kidney injury superimposed on chronic kidney disease (HCC)   Acute on chronic combined systolic and diastolic CHF (congestive heart failure) (HCC)   AF (paroxysmal atrial fibrillation) (HCC)   Pressure injury of skin   Hyponatremia   CAD (coronary artery disease)   C. difficile colitis   Warfarin anticoagulation   Type 2 diabetes mellitus with left diabetic foot ulcer (HCC)   Anemia due to chronic kidney disease   CKD (chronic kidney disease), stage III (HCC)   CKD (chronic kidney disease) stage 4, GFR 15-29 ml/min ([place])   Vomiting    SHAH INSLEY is a 71 y.o. male with severe CDI, at least 2nd episode. He is complaining of diarrhea not contained by rectal tube and asking me to "fix his toe"  #1 Severe CDI:  I am changing him to fidaxomicin and he can complete a 10 day course  Since this is from what I can tell his FIRST recurrence of CDI he does not require pulse and taper  He does NOT have an ileus so he does NOT have to be on flagyl  Would strongly consider DC IV flagyl  I have DC pepcid along with nutritoinal shakes , supplements in case these are exacerbating his loose stools  Defer to GI re type of diet  Dr. Carlean Purl states that he has performed fecal transplant in the hospital once before  I would avoid unnecessary antibiotics  #2 DFU and sacral decubitus ulcers: DO NOT treat with systemic abx He is requesting someone to fix. This I told him EMPHATICALLY that I would not want to treat him with systemic antibiotics. I told him that curative surgery might involve amputation. He is very much against that but wants orthopedic consultation  I am relaying this request to the primary team and ordering plain film of the foot which does not show osteo on plain films  I am going to sign off at  present  Please call with further questions.  LOS: 12 days   Alcide Evener 09/28/2017, 9:22 PM

## 2017-09-28 NOTE — Social Work (Signed)
CSW & [name], CSW met with pt, pt wife, & pt daughter at bedside.   CSWs explained [name]'s note about private pay if pt returns to [facility] before 60 days.   Pt family expressed displeasure with [name] and requested CSWs speak with them about other options.  This Probation officer shared the SNF options with pt family, and pt family has expressed interest in [name]. Pt daughter stated that she would stop by to tour facility.    CSW will follow up with facility and let them know pt/pt family interest.  CSW will continue to follow and support discharge when medically appropriate.   Christian Garner, [place] Work [phone]

## 2017-09-28 NOTE — Progress Notes (Addendum)
[place] for heparin Indication: atrial fibrillation  Allergies  Allergen Reactions  . Metolazone     Per pt family, pt gets delirious and confused when taking metolazone    Patient Measurements: Height: 6' (182.9 cm)(estimate d/t pt unable to answer) Weight: 226 lb 13.7 oz (102.9 kg) IBW/kg (Calculated) : 77.6 Heparin Dosing Weight: 101.6 kg  Vital Signs: Temp: 98.2 F (36.8 C) (12/18 1332) Temp Source: Oral (12/18 1332) BP: 111/55 (12/18 1332) Pulse Rate: 72 (12/18 1332)  Labs: Recent Labs    09/26/17 0246 09/27/17 0541 09/28/17 0537  HGB 8.5* 9.2* 9.2*  HCT 27.3* 29.8* 29.3*  PLT 250 314 315  CREATININE 3.28* 2.87* 2.69*    Estimated Creatinine Clearance: 30.8 mL/min (A) (by C-G formula based on SCr of 2.69 mg/dL (H)).   Medical History: Past Medical History:  Diagnosis Date  . CAD (coronary artery disease)   . Cellulitis and abscess of lower extremity   . CHF (congestive heart failure) (HCC)    EF 30%  . Chronic kidney disease, stage III (moderate) (HCC)   . DM (diabetes mellitus) ([place])   . Recurrent Clostridium difficile diarrhea 09/27/2017    Assessment: 71 yo male on warfarin PTA from [name].  Initially placed on IV heparin upon arrival, but INR continued to rise and heparin was stopped.    All anticoagulation was held due to patient having melena during this admission. He required prbc on 12/15. His INR remained elevated 12/8-12/11 despite not having any warfarin. His GI bleed has resolved, warfarin will be resumed tonight. His INR was therapeutic upon arrival. Now also on flagyl - watch DDI and potential to increase INR closely.  PTA warfarin dosing: 4.5 mg po daily  Goal of Therapy:  INR 2-3 Heparin level 0.3-0.7 units/ml Monitor platelets by anticoagulation protocol: Yes    Plan:  -warfarin 4 mg po x1 -daily INR    Hughes Better, PharmD, BCPS Clinical Pharmacist 09/28/2017 4:47 PM

## 2017-09-28 NOTE — Progress Notes (Signed)
PROGRESS NOTE    Christian Garner  XBW:[id number] DOB: 1946-08-03 DOA: 09/16/2017 PCP: [name], MD      Brief Narrative:  71 yo M with CHF EF 30%, CKD baseline Cr 1.5, Afib on warfarin, CAD, and DM who was presented to OSH with weakness, and was transferred from [place] with Acute renal failure and Cdiff colitis.     Assessment & Plan:  Principal Problem:   Recurrent Clostridium difficile diarrhea Active Problems:   Acute kidney injury superimposed on chronic kidney disease (HCC)   Acute on chronic combined systolic and diastolic CHF (congestive heart failure) (HCC)   AF (paroxysmal atrial fibrillation) (HCC)   Pressure injury of skin   Hyponatremia   CAD (coronary artery disease)   C. difficile colitis   Warfarin anticoagulation   Type 2 diabetes mellitus with left diabetic foot ulcer (HCC)   Anemia due to chronic kidney disease   CKD (chronic kidney disease), stage III (HCC)   CKD (chronic kidney disease) stage 4, GFR 15-29 ml/min (HCC)   Vomiting     Severe C-diff colitis AXR yesterday showed no dilated bowel, suggestion of ileus.  Today, bowels sound mobile.   WBC stable at 13K.  No new fever, hypotension, but still very frequent loose stools.  Failure of vancomycin? -ID have recommended transition from Vanc/Flagyl to Fidaxomicin -ID and GI are consulted, appreciate recs    Acute renal failure on CKD III Baseline Cr 1.5-2.3.  >4 on admission, trending down to 2.6 today.   -Trend I/Os, daily BMP  Acute on Chronic combined Systolic and Diastolic CHF  EF 16%.  Still some leg edema, renal function continuing to improve. 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  This appears well, no rubor, dolor, or calor.  No drainage. -WOC consult  Anemia Of chronic renal failure, bone marrow suppression. -Continue iron -Trend CBC  Chronic Atrial fibrillation -Restart warfarin    -Continue amiodarone  CAD -Continue statin  Diabetes type 2 Controlled with Complication -SSI  Stage 2 pressure injury upper gluteal cleft -Consult WOC  Other medications -Continue Pepcid -Continue trazodone         DVT prophylaxis: N/A on warfarin Code Status: FULL Family Communication: Daughter by phone Disposition Plan: Switch to fidaxo.  Continue diuresis.  Monitor renal function.     Consultants:   GI  ID  Procedures:   PICC placement  Antimicrobials:   Oral vancomycin 12/6 >> 12/18  Flagyl 12/13 >> 12/18  Fidaxomicin 12/18 >>    Subjective: Still weak.  Oriented to place, situation, but listless.  Worried about his foot.  There is no new pain, redness or swelling of the foot.  No new fever.  Stools still frequent.  No new abdominal pain.  Objective: Vitals:   09/27/17 0359 09/27/17 2033 09/28/17 0606 09/28/17 1332  BP: (!) 120/53 (!) 119/55 (!) 108/49 (!) 111/55  Pulse: 67 62 63 72  Resp: 18 18 18 20   Temp: 98.6 F (37 C) 98.8 F (37.1 C) 98.2 F (36.8 C) 98.2 F (36.8 C)  TempSrc: Oral Oral Oral Oral  SpO2: 99% 95% 95% 97%  Weight:      Height:        Intake/Output Summary (Last 24 hours) at 09/28/2017 1532 Last data filed at 09/28/2017 1512 Gross per 24 hour  Intake 220 ml  Output 2225 ml  Net -2005 ml   [employer]  09/24/17 0332 09/25/17 0320 09/26/17 0534  Weight: 101.1 kg (222 lb 14.2 oz) 101.8 kg (224 lb 6.9 oz) 102.9 kg (226 lb 13.7 oz)    Examination: General appearance: Elderly adult male, alert and in no acute distress.  Weak. HEENT: Anicteric, conjunctiva pink, lids and lashes normal. No nasal deformity, discharge, epistaxis.  Lips dry.   Skin: Warm and dry.  No jaundice.  No suspicious rashes or lesions.  There is a healthy appearing ulcer of the left foot. Cardiac: RRR, nl S1-S2, no murmurs appreciated.  Capillary refill is brisk.  JVP slightly up.  2+ LE edema.  Radial pulses 2+ and  symmetric. Respiratory: Normal respiratory rate and rhythm.  CTAB without rales or wheezes. Abdomen: Abdomen soft.  Mild nonfocal TTP. No ascites, distension, hepatosplenomegaly.   MSK: No deformities or effusions. Neuro: Awake and alert.  EOMI, moves all extremities. Speech fluent.    Psych: Sensorium intact and responding to questions, attention normal. Affect blunted.  Judgment and insight appear normal.    Data Reviewed: I have personally reviewed following labs and imaging studies:  CBC: Recent Labs  Lab 09/24/17 1913 09/25/17 0245 09/25/17 1646 09/26/17 0246 09/27/17 0541 09/28/17 0537  WBC 18.8* 15.7* 15.7* 13.5* 13.0* 13.1*  NEUTROABS 15.9*  --  13.3*  --   --   --   HGB 7.9* 7.3* 8.2* 8.5* 9.2* 9.2*  HCT 25.7* 23.6* 26.4* 27.3* 29.8* 29.3*  MCV 94.8 94.4 93.3 93.5 93.1 94.5  PLT 249 242 250 250 314 914   Basic Metabolic Panel: Recent Labs  Lab 09/24/17 0320 09/24/17 1913 09/25/17 0245 09/26/17 0246 09/27/17 0541 09/28/17 0537  NA 131* 132* 133* 134* 137 138  K 4.1 3.9 3.8 3.8 3.9 4.2  CL 93* 94* 95* 97* 100* 103  CO2 29 28 28 28 30 30   GLUCOSE 230* 158* 138* 149* 110* 119*  BUN 123* 130* 126* 125* 110* 99*  CREATININE 3.59* 3.79* 3.61* 3.28* 2.87* 2.69*  CALCIUM 7.3* 7.4* 7.5* 7.5* 7.9* 7.8*  MG 2.4 2.7* 2.5* 2.7* 2.6* 2.6*  PHOS 4.7*  --  4.6 3.9 3.3 3.7   GFR: Estimated Creatinine Clearance: 30.8 mL/min (A) (by C-G formula based on SCr of 2.69 mg/dL (H)). Liver Function Tests: Recent Labs  Lab 09/24/17 0320 09/25/17 0245 09/26/17 0246 09/27/17 0541 09/28/17 0537  ALBUMIN 1.6* 2.1* 1.9* 1.8* 2.0*   No results for input(s): LIPASE, AMYLASE in the last 168 hours. No results for input(s): AMMONIA in the last 168 hours. Coagulation Profile: Recent Labs  Lab 09/22/17 0136  INR 1.44   Cardiac Enzymes: No results for input(s): CKTOTAL, CKMB, CKMBINDEX, TROPONINI in the last 168 hours. BNP (last 3 results) No results for input(s): PROBNP in the  last 8760 hours. HbA1C: No results for input(s): HGBA1C in the last 72 hours. CBG: Recent Labs  Lab 09/27/17 1159 09/27/17 1719 09/27/17 2225 09/28/17 0742 09/28/17 1254  GLUCAP 105* 110* 126* 117* 182*   Lipid Profile: No results for input(s): CHOL, HDL, LDLCALC, TRIG, CHOLHDL, LDLDIRECT in the last 72 hours. Thyroid Function Tests: No results for input(s): TSH, T4TOTAL, FREET4, T3FREE, THYROIDAB in the last 72 hours. Anemia Panel: No results for input(s): VITAMINB12, FOLATE, FERRITIN, TIBC, IRON, RETICCTPCT in the last 72 hours. Urine analysis:    Component Value Date/Time   COLORURINE YELLOW 09/16/2017 1753   APPEARANCEUR HAZY (A) 09/16/2017 1753   LABSPEC >1.030 (H) 09/16/2017 1753   PHURINE 5.0 09/16/2017 1753   GLUCOSEU NEGATIVE 09/16/2017 1753  HGBUR MODERATE (A) 09/16/2017 1753   BILIRUBINUR NEGATIVE 09/16/2017 1753   KETONESUR NEGATIVE 09/16/2017 1753   PROTEINUR NEGATIVE 09/16/2017 1753   NITRITE NEGATIVE 09/16/2017 1753   LEUKOCYTESUR NEGATIVE 09/16/2017 1753   Sepsis Labs: @LABRCNTIP (procalcitonin:4,lacticidven:4)  )No results found for this or any previous visit (from the past 240 hour(s)).       Radiology Studies: Dg Abd 1 View  Result Date: 09/28/2017 CLINICAL DATA:  Abdominal pain and vomiting tonight. EXAM: ABDOMEN - 1 VIEW COMPARISON:  Radiograph 09/23/2017. FINDINGS: No bowel dilatation to suggest obstruction. Previous colonic edema is not visualized on the current exam. No evidence of free air on supine views. There are vascular calcifications. Pelvic calcifications are prostatic. IMPRESSION: Colonic edema on prior radiographs is not visualized. No bowel dilatation. No evidence of free air. Electronically Signed   By: Jeb Levering M.D.   On: 09/28/2017 01:14   Ir Fluoro Guide Cv Line Right  Result Date: 09/27/2017 INDICATION: History of chronic kidney disease and poor venous access. In need of durable intravenous access for medication  administration and blood draws. EXAM: TUNNELED PICC LINE WITH ULTRASOUND AND FLUOROSCOPIC GUIDANCE MEDICATIONS: Patient is currently admitted to the hospital and receiving intravenous antibiotics. The antibiotic was given in an appropriate time interval prior to skin puncture. ANESTHESIA/SEDATION: None FLUOROSCOPY TIME:  18 seconds (2 mGy) COMPLICATIONS: None immediate. PROCEDURE: Informed written consent was obtained from the patient's wife after a discussion of the risks, benefits, and alternatives to treatment. Questions regarding the procedure were encouraged and answered. The right neck and chest were prepped with chlorhexidine in a sterile fashion, and a sterile drape was applied covering the operative field. Maximum barrier sterile technique with sterile gowns and gloves were used for the procedure. A timeout was performed prior to the initiation of the procedure. After the overlying soft tissues were anesthetized, a small venotomy incision was created and a micropuncture kit was utilized to access the internal jugular vein. Real-time ultrasound guidance was utilized for vascular access including the acquisition of a permanent ultrasound image documenting patency of the accessed vessel. The microwire was utilized to measure appropriate catheter length. The micropuncture sheath was exchanged for a peel-away sheath over a guidewire. A 5 French dual lumen tunneled PICC measuring 24 cm was tunneled in a retrograde fashion from the anterior chest wall to the venotomy incision. The catheter was then placed through the peel-away sheath with tip ultimately positioned at the superior caval-atrial junction. Final catheter positioning was confirmed and documented with a spot radiographic image. The catheter aspirates and flushes normally. The catheter was flushed with appropriate volume heparin dwells. The catheter exit site was secured with a 0-Prolene retention suture. The venotomy incision was closed with Dermabond  and Steri-strips. Dressings were applied. The patient tolerated the procedure well without immediate post procedural complication. FINDINGS: After catheter placement, the tip lies within the superior cavoatrial junction. The catheter aspirates and flushes normally and is ready for immediate use. IMPRESSION: Successful placement of 24 cm dual lumen tunneled PICC catheter via the right internal jugular vein with tip terminating at the superior caval atrial junction. The catheter is ready for immediate use. Electronically Signed   By: Sandi Mariscal M.D.   On: 09/27/2017 10:02   Ir US Guide Vasc Access Right  Result Date: 09/27/2017 INDICATION: History of chronic kidney disease and poor venous access. In need of durable intravenous access for medication administration and blood draws. EXAM: TUNNELED PICC LINE WITH ULTRASOUND AND FLUOROSCOPIC GUIDANCE MEDICATIONS: Patient is  currently admitted to the hospital and receiving intravenous antibiotics. The antibiotic was given in an appropriate time interval prior to skin puncture. ANESTHESIA/SEDATION: None FLUOROSCOPY TIME:  18 seconds (2 mGy) COMPLICATIONS: None immediate. PROCEDURE: Informed written consent was obtained from the patient's wife after a discussion of the risks, benefits, and alternatives to treatment. Questions regarding the procedure were encouraged and answered. The right neck and chest were prepped with chlorhexidine in a sterile fashion, and a sterile drape was applied covering the operative field. Maximum barrier sterile technique with sterile gowns and gloves were used for the procedure. A timeout was performed prior to the initiation of the procedure. After the overlying soft tissues were anesthetized, a small venotomy incision was created and a micropuncture kit was utilized to access the internal jugular vein. Real-time ultrasound guidance was utilized for vascular access including the acquisition of a permanent ultrasound image documenting  patency of the accessed vessel. The microwire was utilized to measure appropriate catheter length. The micropuncture sheath was exchanged for a peel-away sheath over a guidewire. A 5 French dual lumen tunneled PICC measuring 24 cm was tunneled in a retrograde fashion from the anterior chest wall to the venotomy incision. The catheter was then placed through the peel-away sheath with tip ultimately positioned at the superior caval-atrial junction. Final catheter positioning was confirmed and documented with a spot radiographic image. The catheter aspirates and flushes normally. The catheter was flushed with appropriate volume heparin dwells. The catheter exit site was secured with a 0-Prolene retention suture. The venotomy incision was closed with Dermabond and Steri-strips. Dressings were applied. The patient tolerated the procedure well without immediate post procedural complication. FINDINGS: After catheter placement, the tip lies within the superior cavoatrial junction. The catheter aspirates and flushes normally and is ready for immediate use. IMPRESSION: Successful placement of 24 cm dual lumen tunneled PICC catheter via the right internal jugular vein with tip terminating at the superior caval atrial junction. The catheter is ready for immediate use. Electronically Signed   By: Sandi Mariscal M.D.   On: 09/27/2017 10:02   Dg Foot 2 Views Left  Result Date: 09/28/2017 CLINICAL DATA:  Possible diabetic foot ulcer. The patient has undergone amputation of the fifth toe. EXAM: LEFT FOOT - 2 VIEW COMPARISON:  Rule left foot series dated April 07, 2017 FINDINGS: The patient is undergone removal of the fifth toe and distal 1/2 of the fifth metatarsal. The remaining bones are subjectively osteopenic. No definite loss of the sharp cortical margins of any of the toes or metatarsals is observed. The stump of the fifth metatarsal is grossly intact. There is chronic deformity of the base of the fourth proximal phalanx. The  bones of the hindfoot are grossly intact. There are plantar and Achilles region calcaneal spurs. There is mild soft tissue swelling over the dorsum of the forefoot. There may be gas within the soft tissues over the lateral aspect of the metatarsal region adjacent to the fourth metatarsal. IMPRESSION: No objective evidence of acute or chronic osteomyelitis. Postsurgical changes of the fifth ray. Chronic deformity of the base of the proximal phalanx of the fourth toe. Probable cellulitis of the lateral aspect of the metatarsal region. Electronically Signed   By: David  Martinique M.D.   On: 09/28/2017 11:14        Scheduled Meds: . amiodarone  200 mg Oral BID  . atorvastatin  40 mg Oral QHS  . ferrous sulfate  325 mg Oral Q breakfast  . fidaxomicin  200  mg Oral BID  . furosemide  40 mg Intravenous BID  . Gerhardt's butt cream   Topical BID  . hydrocortisone  25 mg Rectal BID  . insulin aspart  0-9 Units Subcutaneous TID WC  . mouth rinse  15 mL Mouth Rinse BID  . sodium chloride flush  3 mL Intravenous Q12H  . traZODone  50 mg Oral QHS  . vitamin B-12  1,000 mcg Oral Daily   Continuous Infusions: . metronidazole Stopped (09/28/17 1021)     LOS: 12 days    Time spent: 50 minutes    Edwin Dada, MD [facility] Pager [phone]  If 7PM-7AM, please contact night-coverage [email or web address] Password [email or web address] 09/28/2017, 3:32 PM

## 2017-09-28 NOTE — Progress Notes (Signed)
Physical Therapy Treatment Patient Details Name: Christian Garner MRN: [id number] DOB: Nov 27, 1945 Today's Date: 09/28/2017    History of Present Illness 71 y.o. male with history of systolic CHF with an EF of 30% on home oxygen, CAD s/p PTCA greater than 1 year ago, a-fib chronically anticoagulated with warfarin, CKD with a baseline creatinine of 1.3 (hemodialysis during admission in January 2018), and DM2 with chronic diabetic foot ulcer who presented in transfer from [facility] on 09/16/17 for evaluation of acute on chronic renal failure. Pt found to have sepsis due to c diff.    PT Comments    Pt agitated this morning because he thinks his L foot has not been checked enough and dressing changed frequently enough. However, pt does not know day or time and also thinks someone ate his bacon off his tray. Would not attempt to get out of bed but did agree to sit on edge to eat some of his breakfast. Needed mod A to get to sitting as well as mod A in sitting to be able to use UE's. Decreased trunk control noted and posterior lean. Fatigued after 20 mins EOB. Returned to supine with mod A +2. PT will continue to follow.    Follow Up Recommendations  SNF     Equipment Recommendations  None recommended by PT    Recommendations for Other Services       Precautions / Restrictions Precautions Precautions: Fall Restrictions Weight Bearing Restrictions: No    Mobility  Bed Mobility Overal bed mobility: Needs Assistance Bed Mobility: Supine to Sit Rolling: Mod assist   Supine to sit: Mod assist Sit to supine: Mod assist;+2 for physical assistance   General bed mobility comments: Cues for initiation and sequencing. Assist for LEs to EOB and trunk elevation to sitting with HHA. Able to lower trunk back down to bed for return to supine but needed mod A to get LE's back in bed  Transfers                 General transfer comment: pt did not want to get OOB until doctor had  checked his foot. Though pt dizzy throughout sitting EOB so would not have felt safe transferring to chair  Ambulation/Gait             General Gait Details: unable   Stairs            Wheelchair Mobility    Modified Rankin (Stroke Patients Only)       Balance Overall balance assessment: Needs assistance Sitting-balance support: Feet supported Sitting balance-Leahy Scale: Poor Sitting balance - Comments: sat EOB 20 mins and worked on sitting without UE support, but each time he lifted hands, he needed mod A to counter posterior lean. Had difficulty wt shifting in sitting. Attempted to scoot to R but pt unable and nervous due to rectal tube.  Postural control: Posterior lean                                  Cognition Arousal/Alertness: Awake/alert Behavior During Therapy: Anxious Overall Cognitive Status: No family/caregiver present to determine baseline cognitive functioning Area of Impairment: Orientation;Awareness;Problem solving;Memory                 Orientation Level: Disoriented to;Time   Memory: Decreased short-term memory Following Commands: Follows one step commands inconsistently;Follows one step commands with increased time Safety/Judgement: Decreased awareness of deficits;Decreased awareness of  safety Awareness: Intellectual Problem Solving: Slow processing;Difficulty sequencing General Comments: states that his wife has not been up tonight, though he had just eaten breakfast and we had talked about it being morning time. Confused and emotional throughout session       Exercises General Exercises - Lower Extremity Ankle Circles/Pumps: AROM;Both;10 reps;Supine;Seated Long Arc Quad: AROM;Both;10 reps;Seated Straight Leg Raises: AAROM;Both;5 reps;Supine Other Exercises Other Exercises: L heel cord stretch x1 min    General Comments        Pertinent Vitals/Pain Pain Assessment: No/denies pain    Home Living                       Prior Function            PT Goals (current goals can now be found in the care plan section) Acute Rehab PT Goals Patient Stated Goal: none stated PT Goal Formulation: With patient Time For Goal Achievement: 10/02/17 Potential to Achieve Goals: Fair Progress towards PT goals: Not progressing toward goals - comment(pt agitated about getting OOB before MD came)    Frequency    Min 2X/week      PT Plan Current plan remains appropriate    Co-evaluation              AM-PAC PT "6 Clicks" Daily Activity  Outcome Measure  Difficulty turning over in bed (including adjusting bedclothes, sheets and blankets)?: Unable Difficulty moving from lying on back to sitting on the side of the bed? : Unable Difficulty sitting down on and standing up from a chair with arms (e.g., wheelchair, bedside commode, etc,.)?: Unable Help needed moving to and from a bed to chair (including a wheelchair)?: Total Help needed walking in hospital room?: Total Help needed climbing 3-5 steps with a railing? : Total 6 Click Score: 6    End of Session Equipment Utilized During Treatment: Oxygen Activity Tolerance: Treatment limited secondary to agitation Patient left: in bed;with call bell/phone within reach Nurse Communication: Mobility status PT Visit Diagnosis: Unsteadiness on feet (R26.81);Other abnormalities of gait and mobility (R26.89);Muscle weakness (generalized) (M62.81)     Time: 7169-6789 PT Time Calculation (min) (ACUTE ONLY): 30 min  Charges:  $Therapeutic Activity: 23-37 mins                    G Codes:       Leighton Roach, PT  Acute Rehab Services  [place] 09/28/2017, 10:28 AM

## 2017-09-29 LAB — CBC
HCT: 29.5 % — ABNORMAL LOW (ref 39.0–52.0)
Hemoglobin: 9 g/dL — ABNORMAL LOW (ref 13.0–17.0)
MCH: 29.1 pg (ref 26.0–34.0)
MCHC: 30.5 g/dL (ref 30.0–36.0)
MCV: 95.5 fL (ref 78.0–100.0)
Platelets: 309 10*3/uL (ref 150–400)
RBC: 3.09 MIL/uL — ABNORMAL LOW (ref 4.22–5.81)
RDW: 17.9 % — AB (ref 11.5–15.5)
WBC: 8.9 10*3/uL (ref 4.0–10.5)

## 2017-09-29 LAB — GLUCOSE, CAPILLARY
GLUCOSE-CAPILLARY: 100 mg/dL — AB (ref 65–99)
Glucose-Capillary: 128 mg/dL — ABNORMAL HIGH (ref 65–99)
Glucose-Capillary: 191 mg/dL — ABNORMAL HIGH (ref 65–99)
Glucose-Capillary: 150 mg/dL — ABNORMAL HIGH (ref 65–99)

## 2017-09-29 LAB — RENAL FUNCTION PANEL
ALBUMIN: 1.8 g/dL — AB (ref 3.5–5.0)
Anion gap: 8 (ref 5–15)
BUN: 84 mg/dL — AB (ref 6–20)
CO2: 30 mmol/L (ref 22–32)
CREATININE: 2.26 mg/dL — AB (ref 0.61–1.24)
Calcium: 7.8 mg/dL — ABNORMAL LOW (ref 8.9–10.3)
Chloride: 101 mmol/L (ref 101–111)
GFR calc Af Amer: 32 mL/min — ABNORMAL LOW (ref >60)
GFR, EST NON AFRICAN AMERICAN: 27 mL/min — AB (ref >60)
Glucose, Bld: 128 mg/dL — ABNORMAL HIGH (ref 65–99)
PHOSPHORUS: 2.7 mg/dL (ref 2.5–4.6)
Potassium: 3.9 mmol/L (ref 3.5–5.1)
SODIUM: 139 mmol/L (ref 135–145)

## 2017-09-29 LAB — MAGNESIUM: MAGNESIUM: 2.4 mg/dL (ref 1.7–2.4)

## 2017-09-29 LAB — PROTIME-INR
INR: 2.14
Prothrombin Time: 23.7 seconds — ABNORMAL HIGH (ref 11.4–15.2)

## 2017-09-29 MED ORDER — SODIUM CHLORIDE 0.9% FLUSH
10.0000 mL | INTRAVENOUS | Status: DC | PRN
  Administered 2017-09-29: 20 mL

## 2017-09-29 MED ORDER — MUPIROCIN CALCIUM 2 % EX CREA
TOPICAL_CREAM | Freq: Every day | CUTANEOUS | Status: DC
  Filled 2017-09-29: qty 15

## 2017-09-29 MED ORDER — MUPIROCIN CALCIUM 2 % EX CREA
TOPICAL_CREAM | Freq: Every day | CUTANEOUS | Status: DC
  Administered 2017-09-29 – 2017-10-01 (×3): via TOPICAL
  Filled 2017-09-29: qty 15

## 2017-09-29 NOTE — Consult Note (Addendum)
WOC Re-consult requested for left outer foot.   This was already performed on 12/6, and [facility] team also performed a consult on 12/17 for buttocks MASD and partial thickness skin loss; refer to previous progress notes for assessment and plan of care. Left anterior foot with partial thickness wound; .5X.5X.1cm, pink and dry. Left outer foot with chronic full thickness wound after surgery was performed for 5th toe amputation awile ago, according to patient.  Wound is 2X1X.1cm, dry scabbed edges surrounding which lift off and remove easily, revealing pink dry wound bed. Location on the side of the foot is difficult to promote healing. Plan: Foam dressing to protect and promote healing to anterior foot.  Bactroban to promote moist healing to left outer foot with foam dressing to protect from further injury.  Discussed plan of care with patient and he verbalized understanding. Please re-consult if further assistance is needed.  Thank-you,  Julien Girt MSN, [place], [place], [place], [place]

## 2017-09-29 NOTE — Plan of Care (Signed)
  Education: Knowledge of General Education information will improve 09/29/2017 0405 - Progressing by Anson Fret, RN Note POC reviewed with pt.

## 2017-09-29 NOTE — Progress Notes (Signed)
PROGRESS NOTE    Christian Garner  XBJ:[id number] DOB: 1946-04-16 DOA: 09/16/2017 PCP: [name], MD      Brief Narrative:  71 yo M with CHF EF 30%, CKD baseline Cr 1.5, Afib on warfarin, CAD, and DM who was presented to OSH with weakness, and was transferred from [place] with Acute renal failure and Cdiff colitis.     Assessment & Plan:  Principal Problem:   Recurrent Clostridium difficile diarrhea Active Problems:   Acute kidney injury superimposed on chronic kidney disease (HCC)   Acute on chronic combined systolic and diastolic CHF (congestive heart failure) (HCC)   AF (paroxysmal atrial fibrillation) (HCC)   Pressure injury of skin   Hyponatremia   CAD (coronary artery disease)   C. difficile colitis   Warfarin anticoagulation   Type 2 diabetes mellitus with left diabetic foot ulcer (HCC)   Anemia due to chronic kidney disease   CKD (chronic kidney disease), stage III (HCC)   CKD (chronic kidney disease) stage 4, GFR 15-29 ml/min (HCC)   Vomiting   Diabetic foot ulcer (HCC)     Severe C-diff colitis Switched to fidaxomicin now, WBC continuing to improve, stools better.    -Continue fidaxomicin -ID and GI are consulted, appreciate recs    Acute renal failure on CKD III Baseline Cr 1.5-2.3.  >4 on admission, trending down again today.   -Trend I/Os, daily BMP  Acute on Chronic combined Systolic and Diastolic CHF  EF 30%.  Still peripheral edema and renal function continuing to improve, but a little softer BP today.    -Continue Lasix for now -Daily BMP -Strict I/Os, daily weights   Former diabetic foot infection S/p amputation of left 5th ray.  This appears well, no rubor, dolor, or calor.  No drainage.  Daughter thinks it looks not infected.  I have spoken with Dr. Cannon Kettle, she will stop by in the next few days to given her input.  She and I are in agreement that if there DID develop a cellulitis in this foot, antibiotics are contraindicated. -WOC  consult  Anemia Of chronic renal failure, bone marrow suppression. -Continue iron -Trend CBC  Chronic Atrial fibrillation -Continue warfarin per pharmacy -Continue amiodarone  CAD -Continue statin  Diabetes type 2 Controlled with Complication -SSI  Stage 2 pressure injury upper gluteal cleft -Consult WOC  Other medications -Continue Pepcid -Continue trazodone         DVT prophylaxis: N/A on warfarin Code Status: FULL Family Communication: Daughter by phone Disposition Plan: Switch to fidaxo.  Continue diuresis.  Monitor renal function.  Ortho to eval foot.   Consultants:   GI  ID  Procedures:   PICC placement  Antimicrobials:   Oral vancomycin 12/6 >> 12/18  Flagyl 12/13 >> 12/18  Fidaxomicin 12/18 >>    Subjective: Better today.  Stools better.  Stronger.  No new fever, pain, confusion.  Foot feels fine.     Objective: Vitals:   09/29/17 1004 09/29/17 1437 09/29/17 1500 09/29/17 1747  BP: (!) 118/52 (!) 111/46 (!) 97/47 (!) 113/49  Pulse: 65 61  66  Resp: 16 14  17   Temp: 97.9 F (36.6 C) 97.7 F (36.5 C)  (!) 97.2 F (36.2 C)  TempSrc: Oral Oral  Oral  SpO2: 100% 100%  99%  Weight:      Height:        Intake/Output Summary (Last 24 hours) at 09/29/2017 1841 Last data filed at 09/29/2017 1300 Gross per 24 hour  Intake 680 ml  Output 1250 ml  Net -570 ml   Filed Weights   09/24/17 0332 09/25/17 0320 09/26/17 0534  Weight: 101.1 kg (222 lb 14.2 oz) 101.8 kg (224 lb 6.9 oz) 102.9 kg (226 lb 13.7 oz)    Examination: General appearance: Elderly adult male, alert and in no acute distress.  Weak. HEENT: Anicteric, conjunctiva pink, lids and lashes normal. No nasal deformity, discharge, epistaxis.  Lips dry.   Skin: Warm and dry.  No jaundice.  No suspicious rashes or lesions.  Foot ulcer covered today, but no surrounding redness or pain. Cardiac: RRR, nl S1-S2, no murmurs appreciated.  Capillary refill is brisk.  Still 2+ LE  edema.  Radial pulses 2+ and symmetric. Respiratory: Normal respiratory rate and rhythm.  CTAB without rales or wheezes. Abdomen: Abdomen soft.  Mild nonfocal TTP. No ascites, distension, hepatosplenomegaly.   MSK: No deformities or effusions. Neuro: Awake and alert.  EOMI, moves all extremities. Speech fluent.    Psych: Sensorium intact and responding to questions, attention normal. Affect blunted.  Judgment and insight appear normal.    Data Reviewed: I have personally reviewed following labs and imaging studies:  CBC: Recent Labs  Lab 09/24/17 1913  09/25/17 1646 09/26/17 0246 09/27/17 0541 09/28/17 0537 09/29/17 0441  WBC 18.8*   < > 15.7* 13.5* 13.0* 13.1* 8.9  NEUTROABS 15.9*  --  13.3*  --   --   --   --   HGB 7.9*   < > 8.2* 8.5* 9.2* 9.2* 9.0*  HCT 25.7*   < > 26.4* 27.3* 29.8* 29.3* 29.5*  MCV 94.8   < > 93.3 93.5 93.1 94.5 95.5  PLT 249   < > 250 250 314 315 309   < > = values in this interval not displayed.   Basic Metabolic Panel: Recent Labs  Lab 09/25/17 0245 09/26/17 0246 09/27/17 0541 09/28/17 0537 09/29/17 0441  NA 133* 134* 137 138 139  K 3.8 3.8 3.9 4.2 3.9  CL 95* 97* 100* 103 101  CO2 28 28 30 30 30   GLUCOSE 138* 149* 110* 119* 128*  BUN 126* 125* 110* 99* 84*  CREATININE 3.61* 3.28* 2.87* 2.69* 2.26*  CALCIUM 7.5* 7.5* 7.9* 7.8* 7.8*  MG 2.5* 2.7* 2.6* 2.6* 2.4  PHOS 4.6 3.9 3.3 3.7 2.7   GFR: Estimated Creatinine Clearance: 36.6 mL/min (A) (by C-G formula based on SCr of 2.26 mg/dL (H)). Liver Function Tests: Recent Labs  Lab 09/25/17 0245 09/26/17 0246 09/27/17 0541 09/28/17 0537 09/29/17 0441  ALBUMIN 2.1* 1.9* 1.8* 2.0* 1.8*   No results for input(s): LIPASE, AMYLASE in the last 168 hours. No results for input(s): AMMONIA in the last 168 hours. Coagulation Profile: Recent Labs  Lab 09/28/17 1633 09/29/17 0441  INR 2.19 2.14   Cardiac Enzymes: No results for input(s): CKTOTAL, CKMB, CKMBINDEX, TROPONINI in the last 168  hours. BNP (last 3 results) No results for input(s): PROBNP in the last 8760 hours. HbA1C: No results for input(s): HGBA1C in the last 72 hours. CBG: Recent Labs  Lab 09/28/17 1254 09/28/17 2115 09/29/17 0804 09/29/17 1207 09/29/17 1707  GLUCAP 182* 157* 128* 191* 150*   Lipid Profile: No results for input(s): CHOL, HDL, LDLCALC, TRIG, CHOLHDL, LDLDIRECT in the last 72 hours. Thyroid Function Tests: No results for input(s): TSH, T4TOTAL, FREET4, T3FREE, THYROIDAB in the last 72 hours. Anemia Panel: No results for input(s): VITAMINB12, FOLATE, FERRITIN, TIBC, IRON, RETICCTPCT in the last 72 hours. Urine analysis:  Component Value Date/Time   COLORURINE YELLOW 09/16/2017 1753   APPEARANCEUR HAZY (A) 09/16/2017 1753   LABSPEC >1.030 (H) 09/16/2017 1753   PHURINE 5.0 09/16/2017 1753   GLUCOSEU NEGATIVE 09/16/2017 1753   HGBUR MODERATE (A) 09/16/2017 1753   BILIRUBINUR NEGATIVE 09/16/2017 1753   KETONESUR NEGATIVE 09/16/2017 1753   PROTEINUR NEGATIVE 09/16/2017 1753   NITRITE NEGATIVE 09/16/2017 1753   LEUKOCYTESUR NEGATIVE 09/16/2017 1753   Sepsis Labs: @LABRCNTIP (procalcitonin:4,lacticidven:4)  )No results found for this or any previous visit (from the past 240 hour(s)).       Radiology Studies: Dg Abd 1 View  Result Date: 09/28/2017 CLINICAL DATA:  Abdominal pain and vomiting tonight. EXAM: ABDOMEN - 1 VIEW COMPARISON:  Radiograph 09/23/2017. FINDINGS: No bowel dilatation to suggest obstruction. Previous colonic edema is not visualized on the current exam. No evidence of free air on supine views. There are vascular calcifications. Pelvic calcifications are prostatic. IMPRESSION: Colonic edema on prior radiographs is not visualized. No bowel dilatation. No evidence of free air. Electronically Signed   By: Jeb Levering M.D.   On: 09/28/2017 01:14   Dg Foot 2 Views Left  Result Date: 09/28/2017 CLINICAL DATA:  Possible diabetic foot ulcer. The patient has  undergone amputation of the fifth toe. EXAM: LEFT FOOT - 2 VIEW COMPARISON:  Rule left foot series dated April 07, 2017 FINDINGS: The patient is undergone removal of the fifth toe and distal 1/2 of the fifth metatarsal. The remaining bones are subjectively osteopenic. No definite loss of the sharp cortical margins of any of the toes or metatarsals is observed. The stump of the fifth metatarsal is grossly intact. There is chronic deformity of the base of the fourth proximal phalanx. The bones of the hindfoot are grossly intact. There are plantar and Achilles region calcaneal spurs. There is mild soft tissue swelling over the dorsum of the forefoot. There may be gas within the soft tissues over the lateral aspect of the metatarsal region adjacent to the fourth metatarsal. IMPRESSION: No objective evidence of acute or chronic osteomyelitis. Postsurgical changes of the fifth ray. Chronic deformity of the base of the proximal phalanx of the fourth toe. Probable cellulitis of the lateral aspect of the metatarsal region. Electronically Signed   By: David  Martinique M.D.   On: 09/28/2017 11:14        Scheduled Meds: . amiodarone  200 mg Oral BID  . atorvastatin  40 mg Oral QHS  . ferrous sulfate  325 mg Oral Q breakfast  . fidaxomicin  200 mg Oral BID  . furosemide  40 mg Intravenous BID  . Gerhardt's butt cream   Topical BID  . hydrocortisone  25 mg Rectal BID  . insulin aspart  0-9 Units Subcutaneous TID WC  . mouth rinse  15 mL Mouth Rinse BID  . mupirocin cream   Topical Daily  . sodium chloride flush  3 mL Intravenous Q12H  . traZODone  50 mg Oral QHS  . vitamin B-12  1,000 mcg Oral Daily  . Warfarin - Pharmacist Dosing Inpatient   Does not apply q1800   Continuous Infusions:    LOS: 13 days    Time spent: 25 minutes    Edwin Dada, MD [facility] Pager [phone]  If 7PM-7AM, please contact night-coverage [email or web address] Password [facility] 09/29/2017, 6:41 PM

## 2017-09-29 NOTE — Progress Notes (Signed)
[place] for warfarin Indication: atrial fibrillation  Allergies  Allergen Reactions  . Metolazone     Per pt family, pt gets delirious and confused when taking metolazone    Patient Measurements: Height: 6' (182.9 cm)(estimate d/t pt unable to answer) Weight: 226 lb 13.7 oz (102.9 kg) IBW/kg (Calculated) : 77.6  Vital Signs: Temp: 97.9 F (36.6 C) (12/19 0420) Temp Source: Oral (12/19 0420) BP: 133/56 (12/19 0420) Pulse Rate: 67 (12/19 0420)  Labs: Recent Labs    09/27/17 0541 09/28/17 0537 09/28/17 1633 09/29/17 0441  HGB 9.2* 9.2*  --  9.0*  HCT 29.8* 29.3*  --  29.5*  PLT 314 315  --  309  LABPROT  --   --  24.2* 23.7*  INR  --   --  2.19 2.14  CREATININE 2.87* 2.69*  --  2.26*    Estimated Creatinine Clearance: 36.6 mL/min (A) (by C-G formula based on SCr of 2.26 mg/dL (H)).   Assessment: Warfarin PTA for Afib. All anticoagulation was held due to patient having melena during this admission. He required prbc on 12/15. His INR remained elevated 12/8-12/11 despite not having any warfarin. His GI bleed has resolved, warfarin has been resumed.   INR 2.19 on 12/18 and no warfarin has been given recently - this INR resulted after warfarin 4 mg given. INR stable at 2.14 today but expect dose last night may increase INR in several days. Also on Flagyl IV which can increase INR. No bleeding noted, Hgb stable in 9s, platelets are normal.  PTA warfarin dosing: 4.5 mg daily  Goal of Therapy:  INR 2-3 Monitor platelets by anticoagulation protocol: Yes   Plan:  -Hold warfarin tonight -Daily INR -Monitor for s/sx of bleeding   Renold Genta, PharmD, BCPS Clinical Pharmacist Phone for today - [place] - [phone] 09/29/2017 8:09 AM

## 2017-09-30 DIAGNOSIS — L03119 Cellulitis of unspecified part of limb: Secondary | ICD-10-CM

## 2017-09-30 LAB — CBC
HEMATOCRIT: 28.1 % — AB (ref 39.0–52.0)
HEMOGLOBIN: 8.7 g/dL — AB (ref 13.0–17.0)
MCH: 29.8 pg (ref 26.0–34.0)
MCHC: 31 g/dL (ref 30.0–36.0)
MCV: 96.2 fL (ref 78.0–100.0)
Platelets: 283 10*3/uL (ref 150–400)
RBC: 2.92 MIL/uL — AB (ref 4.22–5.81)
RDW: 17.9 % — AB (ref 11.5–15.5)
WBC: 9 10*3/uL (ref 4.0–10.5)

## 2017-09-30 LAB — PROTIME-INR
INR: 2.25
Prothrombin Time: 24.7 seconds — ABNORMAL HIGH (ref 11.4–15.2)

## 2017-09-30 LAB — RENAL FUNCTION PANEL
ANION GAP: 5 (ref 5–15)
Albumin: 1.8 g/dL — ABNORMAL LOW (ref 3.5–5.0)
BUN: 55 mg/dL — AB (ref 6–20)
CHLORIDE: 103 mmol/L (ref 101–111)
CO2: 31 mmol/L (ref 22–32)
Calcium: 7.7 mg/dL — ABNORMAL LOW (ref 8.9–10.3)
Creatinine, Ser: 1.83 mg/dL — ABNORMAL HIGH (ref 0.61–1.24)
GFR calc Af Amer: 41 mL/min — ABNORMAL LOW (ref >60)
GFR, EST NON AFRICAN AMERICAN: 35 mL/min — AB (ref >60)
Glucose, Bld: 111 mg/dL — ABNORMAL HIGH (ref 65–99)
POTASSIUM: 3.9 mmol/L (ref 3.5–5.1)
Phosphorus: 2.2 mg/dL — ABNORMAL LOW (ref 2.5–4.6)
Sodium: 139 mmol/L (ref 135–145)

## 2017-09-30 LAB — MAGNESIUM: Magnesium: 2.1 mg/dL (ref 1.7–2.4)

## 2017-09-30 LAB — GLUCOSE, CAPILLARY
GLUCOSE-CAPILLARY: 111 mg/dL — AB (ref 65–99)
GLUCOSE-CAPILLARY: 146 mg/dL — AB (ref 65–99)
Glucose-Capillary: 109 mg/dL — ABNORMAL HIGH (ref 65–99)
Glucose-Capillary: 114 mg/dL — ABNORMAL HIGH (ref 65–99)

## 2017-09-30 MED ORDER — WARFARIN SODIUM 2 MG PO TABS
2.0000 mg | ORAL_TABLET | Freq: Once | ORAL | Status: AC
  Administered 2017-09-30: 2 mg via ORAL
  Filled 2017-09-30: qty 1

## 2017-09-30 MED ORDER — PRO-STAT SUGAR FREE PO LIQD
30.0000 mL | Freq: Two times a day (BID) | ORAL | Status: DC
  Administered 2017-09-30 – 2017-10-01 (×3): 30 mL via ORAL
  Filled 2017-09-30 (×3): qty 30

## 2017-09-30 MED ORDER — ADULT MULTIVITAMIN W/MINERALS CH
1.0000 | ORAL_TABLET | Freq: Every day | ORAL | Status: DC
  Administered 2017-09-30 – 2017-10-01 (×2): 1 via ORAL
  Filled 2017-09-30 (×2): qty 1

## 2017-09-30 NOTE — Progress Notes (Signed)
Occupational Therapy Treatment Patient Details Name: Christian Garner MRN: [id number] DOB: 1945/10/18 Today's Date: 09/30/2017    History of present illness 71 y.o. male with history of systolic CHF with an EF of 30% on home oxygen, CAD s/p PTCA greater than 1 year ago, a-fib chronically anticoagulated with warfarin, CKD with a baseline creatinine of 1.3 (hemodialysis during admission in January 2018), and DM2 with chronic diabetic foot ulcer who presented in transfer from [facility] on 09/16/17 for evaluation of acute on chronic renal failure. Pt found to have sepsis due to c diff.   OT comments  Pt progressing slowly. Anxious, requiring maximum encouragement for OOB activity. Pt verbalizing understanding of the benefits for remaining up in chair after therapy, but immediately asking to return to bed. Pt continues to be appropriate for SNF level rehab.  Follow Up Recommendations  SNF;Supervision/Assistance - 24 hour    Equipment Recommendations  3 in 1 bedside commode    Recommendations for Other Services      Precautions / Restrictions Precautions Precautions: Fall Precaution Comments: now full WB on L foot Restrictions Weight Bearing Restrictions: No       Mobility Bed Mobility Overal bed mobility: Needs Assistance Bed Mobility: Sit to Sidelying;Rolling Rolling: Min assist      Sit to sidelying: Min assist General bed mobility comments: asks therapist for help rather than following cues to reach bed rail  Transfers Overall transfer level: Needs assistance Equipment used: Rolling walker (2 wheeled) Transfers: Sit to/from Stand Sit to Stand: +2 physical assistance;Mod assist;Min assist Stand pivot transfers: Mod assist;+2 physical assistance       General transfer comment: assist to rise and gain balance, cues for hand placement, pt initially dizzy upon sitting up, BP monitored    Balance Overall balance assessment: Needs assistance Sitting-balance support:  Feet supported Sitting balance-Leahy Scale: Fair       Standing balance-Leahy Scale: Poor Standing balance comment: significant leaning on walker to step to chair and then to walk to door                           ADL either performed or assessed with clinical judgement   ADL Overall ADL's : Needs assistance/impaired                 Upper Body Dressing : Minimal assistance   Lower Body Dressing: Maximal assistance;Bed level   Toilet Transfer: +2 for physical assistance;Ambulation;Minimal assistance;RW           Functional mobility during ADLs: Minimal assistance;+2 for physical assistance General ADL Comments: Pt initially pivoting to chair. With encouragement, ambulated to door 5 feet. Poor activity tolerance and high anxiety interfering with progression of activity.     Vision       Perception     Praxis      Cognition Arousal/Alertness: Awake/alert Behavior During Therapy: Anxious Overall Cognitive Status: No family/caregiver present to determine baseline cognitive functioning Area of Impairment: Safety/judgement;Awareness;Problem solving                 Orientation Level: Situation   Memory: Decreased recall of precautions Following Commands: Follows one step commands with increased time Safety/Judgement: Decreased awareness of safety;Decreased awareness of deficits Awareness: Intellectual Problem Solving: Slow processing;Requires verbal cues General Comments: requires maximal encouragement to participate, poor insight        Exercises     Shoulder Instructions       General Comments  Pertinent Vitals/ Pain       Pain Assessment: Faces Faces Pain Scale: Hurts even more Pain Location: legs with standing  Pain Descriptors / Indicators: Sore Pain Intervention(s): Repositioned;Monitored during session;Limited activity within patient's tolerance  Home Living                                           Prior Functioning/Environment              Frequency  Min 2X/week        Progress Toward Goals  OT Goals(current goals can now be found in the care plan section)  Progress towards OT goals: Progressing toward goals  Acute Rehab OT Goals Patient Stated Goal: none stated OT Goal Formulation: With patient Time For Goal Achievement: 10/02/17 Potential to Achieve Goals: Good  Plan Discharge plan remains appropriate    Co-evaluation    PT/OT/SLP Co-Evaluation/Treatment: Yes Reason for Co-Treatment: Complexity of the patient's impairments (multi-system involvement);For patient/therapist safety;To address functional/ADL transfers PT goals addressed during session: Mobility/safety with mobility;Balance        AM-PAC PT "6 Clicks" Daily Activity     Outcome Measure   Help from another person eating meals?: None Help from another person taking care of personal grooming?: A Little Help from another person toileting, which includes using toliet, bedpan, or urinal?: A Lot Help from another person bathing (including washing, rinsing, drying)?: A Lot Help from another person to put on and taking off regular upper body clothing?: A Little Help from another person to put on and taking off regular lower body clothing?: A Lot 6 Click Score: 16    End of Session Equipment Utilized During Treatment: Rolling walker;Gait belt;Oxygen  OT Visit Diagnosis: Unsteadiness on feet (R26.81);Other abnormalities of gait and mobility (R26.89);Pain   Activity Tolerance Patient tolerated treatment well   Patient Left in chair;with call bell/phone within reach   Nurse Communication Mobility status        Time: 9470-9628 OT Time Calculation (min): 32 min  Charges: OT General Charges $OT Visit: 1 Visit OT Treatments $Self Care/Home Management : 8-22 mins  09/30/2017 Nestor Lewandowsky, OTR/L Pager: [phone] Werner Lean, Haze Boyden 09/30/2017, 1:51 PM

## 2017-09-30 NOTE — Progress Notes (Signed)
Physical Therapy Treatment Patient Details Name: Christian Garner MRN: [id number] DOB: 1946-05-07 Today's Date: 09/30/2017    History of Present Illness 71 y.o. male with history of systolic CHF with an EF of 30% on home oxygen, CAD s/p PTCA greater than 1 year ago, a-fib chronically anticoagulated with warfarin, CKD with a baseline creatinine of 1.3 (hemodialysis during admission in January 2018), and DM2 with chronic diabetic foot ulcer who presented in transfer from [facility] on 09/16/17 for evaluation of acute on chronic renal failure. Pt found to have sepsis due to c diff.    PT Comments    Pt was able to take a couple feet of steps to chair and then encouraged him to stand and make an effort to walk with RW.  He is able to get a few feet with some significant encouragement and will expect him to need SNF for follow up. He is not going to be able to navigate without less than 2 person help for now due to O2 and IV with some close guard of chair, and will continue to push on as needed in acute therapy to shorten need to Stay in rehab after hosp.  Follow Up Recommendations  SNF     Equipment Recommendations  None recommended by PT    Recommendations for Other Services       Precautions / Restrictions Precautions Precautions: Fall Precaution Comments: ck vitals to see if BP low Restrictions Weight Bearing Restrictions: No    Mobility  Bed Mobility Overal bed mobility: Needs Assistance Bed Mobility: Supine to Sit Rolling: Min assist   Supine to sit: Min assist     General bed mobility comments: asks therapist for help rather than following cues to reach bed rail  Transfers Overall transfer level: Needs assistance Equipment used: Rolling walker (2 wheeled) Transfers: Sit to/from Stand Sit to Stand: Mod assist;+2 physical assistance;+2 safety/equipment;From elevated surface(2 for height from chair and one for elevated bed)         General transfer comment: sat  initially and ck of BP was   103 sitting, not dizzy standing but was anxious  Ambulation/Gait Ambulation/Gait assistance: Min assist;+2 physical assistance;+2 safety/equipment Ambulation Distance (Feet): 8 Feet Assistive device: Rolling walker (2 wheeled);1 person hand held assist;Pushed wheelchair Gait Pattern/deviations: Step-to pattern;Step-through pattern;Decreased stride length;Decreased weight shift to left;Shuffle;Wide base of support Gait velocity: reduced Gait velocity interpretation: Below normal speed for age/gender     Stairs            Wheelchair Mobility    Modified Rankin (Stroke Patients Only)       Balance Overall balance assessment: Needs assistance Sitting-balance support: Feet supported Sitting balance-Leahy Scale: Fair       Standing balance-Leahy Scale: Poor Standing balance comment: significant leaning on walker to step to chair and then to walk to door                            Cognition Arousal/Alertness: Awake/alert Behavior During Therapy: Anxious;Agitated Overall Cognitive Status: No family/caregiver present to determine baseline cognitive functioning Area of Impairment: Safety/judgement;Awareness;Problem solving                 Orientation Level: Situation   Memory: Decreased recall of precautions Following Commands: Follows one step commands with increased time Safety/Judgement: Decreased awareness of safety;Decreased awareness of deficits Awareness: Intellectual Problem Solving: Slow processing;Requires verbal cues        Exercises  General Comments        Pertinent Vitals/Pain Pain Assessment: Faces Faces Pain Scale: Hurts even more Pain Location: legs with standing  Pain Descriptors / Indicators: Sore Pain Intervention(s): Limited activity within patient's tolerance;Monitored during session;Repositioned    Home Living                      Prior Function            PT Goals  (current goals can now be found in the care plan section) Acute Rehab PT Goals Patient Stated Goal: none stated Progress towards PT goals: Progressing toward goals    Frequency    Min 2X/week      PT Plan Current plan remains appropriate    Co-evaluation PT/OT/SLP Co-Evaluation/Treatment: Yes Reason for Co-Treatment: Complexity of the patient's impairments (multi-system involvement);For patient/therapist safety;To address functional/ADL transfers PT goals addressed during session: Mobility/safety with mobility;Balance        AM-PAC PT "6 Clicks" Daily Activity  Outcome Measure  Difficulty turning over in bed (including adjusting bedclothes, sheets and blankets)?: Unable Difficulty moving from lying on back to sitting on the side of the bed? : Unable Difficulty sitting down on and standing up from a chair with arms (e.g., wheelchair, bedside commode, etc,.)?: Unable Help needed moving to and from a bed to chair (including a wheelchair)?: A Little Help needed walking in hospital room?: A Little Help needed climbing 3-5 steps with a railing? : Total 6 Click Score: 10    End of Session Equipment Utilized During Treatment: Oxygen Activity Tolerance: Treatment limited secondary to medical complications (Comment)(dizziness initially) Patient left: in chair;with call bell/phone within reach Nurse Communication: Mobility status PT Visit Diagnosis: Unsteadiness on feet (R26.81);Other abnormalities of gait and mobility (R26.89);Muscle weakness (generalized) (M62.81)     Time: 1020-1050 PT Time Calculation (min) (ACUTE ONLY): 30 min  Charges:  $Gait Training: 8-22 mins                    G Codes:  Functional Assessment Tool Used: AM-PAC 6 Clicks Basic Mobility     Ramond Dial 09/30/2017, 12:32 PM   Mee Hives, PT MS Acute Rehab Dept. Number: [place] [facility] [place]

## 2017-09-30 NOTE — Progress Notes (Signed)
PROGRESS NOTE    Christian Garner  QIO:[id number] DOB: 1945-10-30 DOA: 09/16/2017 PCP: [name], MD      Brief Narrative:  71 yo M with CHF EF 30%, CKD baseline Cr 1.5, Afib on warfarin, CAD, and DM who was presented to OSH with weakness, and was transferred from [facility] with Acute renal failure and Cdiff colitis.     Assessment & Plan:  Principal Problem:   Recurrent Clostridium difficile diarrhea Active Problems:   Acute kidney injury superimposed on chronic kidney disease (HCC)   Acute on chronic combined systolic and diastolic CHF (congestive heart failure) (HCC)   AF (paroxysmal atrial fibrillation) (HCC)   Pressure injury of skin   Hyponatremia   CAD (coronary artery disease)   C. difficile colitis   Warfarin anticoagulation   Type 2 diabetes mellitus with left diabetic foot ulcer (HCC)   Anemia due to chronic kidney disease   CKD (chronic kidney disease), stage III (HCC)   CKD (chronic kidney disease) stage 4, GFR 15-29 ml/min (HCC)   Vomiting   Diabetic foot ulcer (HCC)     Severe C-diff colitis Switched to fidaxomicin now, WBC continuing to improve, stools better.    -Continue fidaxomicin -ID and GI are consulted, appreciate recs -Discontinue PICC which was placed for possible longterm Flagyl    Acute renal failure on CKD III Baseline Cr 1.5-2.3.  >4 on admission, trending down again today, in baseline range.   -Trend I/Os, daily BMP  Acute on Chronic combined Systolic and Diastolic CHF  EF 32%.  Still peripheral edema and renal function continuing to improve.    -Continue Lasix for now -Still on supplemental O2, but weaning now -Daily BMP -Strict I/Os, daily weights   Former diabetic foot infection Resolved.  There is no infection. -WOC consult  Anemia Of chronic renal failure, bone marrow suppression. -Continue iron -Trend CBC  Chronic Atrial fibrillation -Continue warfarin per pharmacy -Continue amiodarone  CAD -Continue  statin  Diabetes type 2 Controlled with Complication -SSI  Stage 2 pressure injury upper gluteal cleft -Consult WOC  Other medications -Continue Pepcid -Continue trazodone         DVT prophylaxis: N/A on warfarin Code Status: FULL Family Communication: None Disposition Plan: Switched to fidaxo.  Continue diuresis 1 Garner day.  Monitor renal function.  Likely to SNF tomorrow or Saturday if not requiring O2, able to sit up with PT and ambulate short distance.   Consultants:   GI  ID  Procedures:   PICC placement  Antimicrobials:   Oral vancomycin 12/6 >> 12/18  Flagyl 12/13 >> 12/18  Fidaxomicin 12/18 >>    Subjective: Better again today.  Stools slmost formed.  Up with PT some.  No new fever, pain, confusion, foot swelling, pain redness.    Objective: Vitals:   09/29/17 1900 09/29/17 2300 09/30/17 0545 09/30/17 1515  BP: (!) 115/54 (!) 120/55 (!) 120/59 (!) 116/51  Pulse: 63  67 72  Resp: 18  18 16   Temp: (!) 97.5 F (36.4 C) 98.3 F (36.8 C) 98 F (36.7 C) 98.2 F (36.8 C)  TempSrc: Oral Oral Oral Oral  SpO2: 100%  100% 99%  Weight:      Height:        Intake/Output Summary (Last 24 hours) at 09/30/2017 1621 Last data filed at 09/30/2017 1149 Gross per 24 hour  Intake -  Output 2675 ml  Net -2675 ml   Filed Weights   09/24/17 0332 09/25/17 0320 09/26/17 0534  Weight: 101.1 kg (222 lb 14.2 oz) 101.8 kg (224 lb 6.9 oz) 102.9 kg (226 lb 13.7 oz)    Examination: General appearance: Elderly adult male, alert and in no acute distress.  Weak.  Anxious. HEENT: Anicteric, conjunctiva pink, lids and lashes normal. No nasal deformity, discharge, epistaxis.  Lips dry.   Skin: Warm and dry.  No jaundice.  No suspicious rashes or lesions.   Cardiac: RRR, nl S1-S2, no murmurs appreciated.  Capillary refill is brisk.  Still 1+ LE edema.  Radial pulses 2+ and symmetric. Respiratory: Normal respiratory rate and rhythm.  CTAB without rales or  wheezes. Abdomen: Abdomen soft.  Mild nonfocal TTP. No ascites, distension, hepatosplenomegaly.   MSK: No deformities or effusions. Neuro: Awake and alert.  EOMI, moves all extremities. Speech fluent.    Psych: Sensorium intact and responding to questions, attention normal. Affect blunted.  Judgment and insight appear normal.    Data Reviewed: I have personally reviewed following labs and imaging studies:  CBC: Recent Labs  Lab 09/24/17 1913  09/25/17 1646 09/26/17 0246 09/27/17 0541 09/28/17 0537 09/29/17 0441 09/30/17 0437  WBC 18.8*   < > 15.7* 13.5* 13.0* 13.1* 8.9 9.0  NEUTROABS 15.9*  --  13.3*  --   --   --   --   --   HGB 7.9*   < > 8.2* 8.5* 9.2* 9.2* 9.0* 8.7*  HCT 25.7*   < > 26.4* 27.3* 29.8* 29.3* 29.5* 28.1*  MCV 94.8   < > 93.3 93.5 93.1 94.5 95.5 96.2  PLT 249   < > 250 250 314 315 309 283   < > = values in this interval not displayed.   Basic Metabolic Panel: Recent Labs  Lab 09/26/17 0246 09/27/17 0541 09/28/17 0537 09/29/17 0441 09/30/17 0437  NA 134* 137 138 139 139  K 3.8 3.9 4.2 3.9 3.9  CL 97* 100* 103 101 103  CO2 28 30 30 30 31   GLUCOSE 149* 110* 119* 128* 111*  BUN 125* 110* 99* 84* 55*  CREATININE 3.28* 2.87* 2.69* 2.26* 1.83*  CALCIUM 7.5* 7.9* 7.8* 7.8* 7.7*  MG 2.7* 2.6* 2.6* 2.4 2.1  PHOS 3.9 3.3 3.7 2.7 2.2*   GFR: Estimated Creatinine Clearance: 45.3 mL/min (A) (by C-G formula based on SCr of 1.83 mg/dL (H)). Liver Function Tests: Recent Labs  Lab 09/26/17 0246 09/27/17 0541 09/28/17 0537 09/29/17 0441 09/30/17 0437  ALBUMIN 1.9* 1.8* 2.0* 1.8* 1.8*   No results for input(s): LIPASE, AMYLASE in the last 168 hours. No results for input(s): AMMONIA in the last 168 hours. Coagulation Profile: Recent Labs  Lab 09/28/17 1633 09/29/17 0441 09/30/17 0437  INR 2.19 2.14 2.25   Cardiac Enzymes: No results for input(s): CKTOTAL, CKMB, CKMBINDEX, TROPONINI in the last 168 hours. BNP (last 3 results) No results for input(s):  PROBNP in the last 8760 hours. HbA1C: No results for input(s): HGBA1C in the last 72 hours. CBG: Recent Labs  Lab 09/29/17 1207 09/29/17 1707 09/29/17 2230 09/30/17 0846 09/30/17 1222  GLUCAP 191* 150* 100* 109* 114*   Lipid Profile: No results for input(s): CHOL, HDL, LDLCALC, TRIG, CHOLHDL, LDLDIRECT in the last 72 hours. Thyroid Function Tests: No results for input(s): TSH, T4TOTAL, FREET4, T3FREE, THYROIDAB in the last 72 hours. Anemia Panel: No results for input(s): VITAMINB12, FOLATE, FERRITIN, TIBC, IRON, RETICCTPCT in the last 72 hours. Urine analysis:    Component Value Date/Time   COLORURINE YELLOW 09/16/2017 1753   APPEARANCEUR HAZY (A) 09/16/2017 1753  LABSPEC >1.030 (H) 09/16/2017 1753   PHURINE 5.0 09/16/2017 1753   GLUCOSEU NEGATIVE 09/16/2017 1753   HGBUR MODERATE (A) 09/16/2017 1753   BILIRUBINUR NEGATIVE 09/16/2017 1753   KETONESUR NEGATIVE 09/16/2017 1753   PROTEINUR NEGATIVE 09/16/2017 1753   NITRITE NEGATIVE 09/16/2017 1753   LEUKOCYTESUR NEGATIVE 09/16/2017 1753   Sepsis Labs: @LABRCNTIP (procalcitonin:4,lacticidven:4)  )No results found for this or any previous visit (from the past 240 hour(s)).       Radiology Studies: No results found.      Scheduled Meds: . amiodarone  200 mg Oral BID  . atorvastatin  40 mg Oral QHS  . feeding supplement (PRO-STAT SUGAR FREE 64)  30 mL Oral BID  . ferrous sulfate  325 mg Oral Q breakfast  . fidaxomicin  200 mg Oral BID  . furosemide  40 mg Intravenous BID  . Gerhardt's butt cream   Topical BID  . hydrocortisone  25 mg Rectal BID  . insulin aspart  0-9 Units Subcutaneous TID WC  . mouth rinse  15 mL Mouth Rinse BID  . multivitamin with minerals  1 tablet Oral Daily  . mupirocin cream   Topical Daily  . sodium chloride flush  3 mL Intravenous Q12H  . traZODone  50 mg Oral QHS  . vitamin B-12  1,000 mcg Oral Daily  . warfarin  2 mg Oral ONCE-1800  . Warfarin - Pharmacist Dosing Inpatient   Does  not apply q1800   Continuous Infusions:    LOS: 14 days    Time spent: 25 minutes    Edwin Dada, MD [facility] Pager [phone]  If 7PM-7AM, please contact night-coverage [email or web address] Password [email or web address] 09/30/2017, 4:21 PM

## 2017-09-30 NOTE — Progress Notes (Signed)
[facility] for warfarin Indication: atrial fibrillation  Allergies  Allergen Reactions  . Metolazone     Per pt family, pt gets delirious and confused when taking metolazone    Patient Measurements: Height: 6' (182.9 cm)(estimate d/t pt unable to answer) Weight: 226 lb 13.7 oz (102.9 kg) IBW/kg (Calculated) : 77.6  Vital Signs: Temp: 98 F (36.7 C) (12/20 0545) Temp Source: Oral (12/20 0545) BP: 120/59 (12/20 0545) Pulse Rate: 67 (12/20 0545)  Labs: Recent Labs    09/28/17 0537 09/28/17 1633 09/29/17 0441 09/30/17 0437  HGB 9.2*  --  9.0* 8.7*  HCT 29.3*  --  29.5* 28.1*  PLT 315  --  309 283  LABPROT  --  24.2* 23.7* 24.7*  INR  --  2.19 2.14 2.25  CREATININE 2.69*  --  2.26* 1.83*    Estimated Creatinine Clearance: 45.3 mL/min (A) (by C-G formula based on SCr of 1.83 mg/dL (H)).   Assessment: Warfarin PTA for Afib. All anticoagulation was held due to patient having melena during this admission. He required prbc on 12/15. His INR remained elevated 12/8-12/11 despite not having any warfarin. His GI bleed has resolved, warfarin has been resumed.   INR 2.19 on 12/18 and no warfarin has been given recently - this INR resulted after warfarin 4 mg given. Today, INR is up at 2.25 despite holding on 12/19 due to 4mg  dose on 12/18. Flagyl has been discontinued. Patient remains on Amiodarone which is also a home med. Patient's po intake remains poor at 25% likely also contributing to INR.   PTA warfarin dosing: 4.5 mg daily  Goal of Therapy:  INR 2-3 Monitor platelets by anticoagulation protocol: Yes   Plan:  Warfarin 2mg  po x1 tonight (half dose) Daily INR Monitor for s/sx of bleeding   Sloan Leiter, PharmD, BCPS, BCCCP Clinical Pharmacist Clinical phone 09/30/2017 until 3:30PM - #[phone] After hours, please call #[phone] 09/30/2017 1:50 PM

## 2017-09-30 NOTE — Consult Note (Signed)
Podiatry Consult Note   HPI: Christian Garner is a 71 y.o. Diabetic male patient seen bedside for follow up evaluation of Left foot. Patient is s/p Left partial 5th ray resection and is well known to my office. Patient was admitted on 09/16/2017 for RENAL FAILURE and admits he had terrible diarrhea that "tore my insides up". Patient states that he feels so much better and is ready to get out of bed so it can help his muscles from being weak. Patient states that he stayed up all night to wait for me and stated that he was glad to see me. Patient denies nausea/vomitting/fever/chills/night sweats or any other symptoms or overnight events.  Patient Active Problem List   Diagnosis Date Noted  . Diabetic foot ulcer ([place])   . Recurrent Clostridium difficile diarrhea 09/27/2017  . CKD (chronic kidney disease) stage 4, GFR 15-29 ml/min (HCC)   . Vomiting   . C. difficile colitis 09/16/2017  . Warfarin anticoagulation 09/16/2017  . Type 2 diabetes mellitus with left diabetic foot ulcer ([place]) 09/16/2017  . Anemia due to chronic kidney disease 09/16/2017  . CKD (chronic kidney disease), stage III ([place]) 09/16/2017  . CAD (coronary artery disease) 06/07/2017  . Recurrent pleural effusion on right   . S/P thoracentesis   . Cellulitis of left lower extremity   . Goals of care, counseling/discussion   . Palliative care encounter   . Hyponatremia   . Pressure injury of skin 03/20/2017  . Acute on chronic combined systolic and diastolic CHF (congestive heart failure) ([place]) 03/19/2017  . Acute on chronic respiratory failure with hypoxia and hypercapnia ([place]) 03/19/2017  . AF (paroxysmal atrial fibrillation) ([place]) 03/19/2017  . Community acquired pneumonia 03/19/2017  . Pleural effusion   . Acute kidney injury superimposed on chronic kidney disease (HCC)      Current Facility-Administered Medications:  .  acetaminophen (TYLENOL) tablet 650 mg, 650 mg, Oral, Q6H PRN, 650 mg at 09/24/17 0914 **OR**  acetaminophen (TYLENOL) suppository 650 mg, 650 mg, Rectal, Q6H PRN, Lady Deutscher, MD .  amiodarone (PACERONE) tablet 200 mg, 200 mg, Oral, BID, Lady Deutscher, MD, 200 mg at 09/29/17 2211 .  atorvastatin (LIPITOR) tablet 40 mg, 40 mg, Oral, QHS, Lady Deutscher, MD, 40 mg at 09/29/17 2211 .  calcium carbonate (TUMS - dosed in mg elemental calcium) chewable tablet 200 mg of elemental calcium, 1 tablet, Oral, Daily PRN, Lady Deutscher, MD .  chlorproMAZINE (THORAZINE) injection 25 mg, 25 mg, Intramuscular, BID PRN, Allie Bossier, MD, 25 mg at 09/24/17 0137 .  ferrous sulfate tablet 325 mg, 325 mg, Oral, Q breakfast, Lady Deutscher, MD, 325 mg at 09/29/17 [phone] .  fidaxomicin (DIFICID) tablet 200 mg, 200 mg, Oral, BID, Tommy Medal, Lavell Islam, MD, 200 mg at 09/29/17 2205 .  fluticasone (FLONASE) 50 MCG/ACT nasal spray 1 spray, 1 spray, Each Nare, Daily PRN, Lady Deutscher, MD .  furosemide (LASIX) injection 40 mg, 40 mg, Intravenous, BID, Allie Bossier, MD, 40 mg at 09/29/17 1750 .  Gerhardt's butt cream, , Topical, BID, Armbruster, Carlota Raspberry, MD .  hydrocortisone (ANUSOL-HC) suppository 25 mg, 25 mg, Rectal, BID, [name], MD, 25 mg at 09/29/17 2211 .  insulin aspart (novoLOG) injection 0-9 Units, 0-9 Units, Subcutaneous, TID WC, Lady Deutscher, MD, 1 Units at 09/29/17 1750 .  lidocaine (XYLOCAINE) 1 % (with pres) injection, , , PRN, Sandi Mariscal, MD, 10 mL at 09/27/17 6010 .  MEDLINE mouth  rinse, 15 mL, Mouth Rinse, BID, Lady Deutscher, MD, 15 mL at 09/29/17 2213 .  mupirocin cream (BACTROBAN) 2 %, , Topical, Daily, Danford, Suann Larry, MD .  nitroGLYCERIN (NITROSTAT) SL tablet 0.4 mg, 0.4 mg, Sublingual, Q5 min PRN, Evangeline Gula, Wyatt Haste, MD .  ondansetron (ZOFRAN) tablet 4 mg, 4 mg, Oral, Q6H PRN **OR** ondansetron (ZOFRAN) injection 4 mg, 4 mg, Intravenous, Q6H PRN, Lady Deutscher, MD, 4 mg at 09/29/17 0418 .  oxyCODONE (Oxy IR/ROXICODONE) immediate  release tablet 5 mg, 5 mg, Oral, Q4H PRN, Lady Deutscher, MD, 5 mg at 09/27/17 0356 .  sodium chloride (OCEAN) 0.65 % nasal spray 2 spray, 2 spray, Each Nare, TID PRN, Lady Deutscher, MD .  sodium chloride flush (NS) 0.9 % injection 10-40 mL, 10-40 mL, Intracatheter, PRN, Danford, Suann Larry, MD, 20 mL at 09/29/17 0458 .  sodium chloride flush (NS) 0.9 % injection 3 mL, 3 mL, Intravenous, Q12H, Lady Deutscher, MD, 3 mL at 09/29/17 2214 .  traZODone (DESYREL) tablet 50 mg, 50 mg, Oral, QHS, Lady Deutscher, MD, 50 mg at 09/29/17 2211 .  vitamin B-12 (CYANOCOBALAMIN) tablet 1,000 mcg, 1,000 mcg, Oral, Daily, Lady Deutscher, MD, 1,000 mcg at 09/29/17 0853 .  Warfarin - Pharmacist Dosing Inpatient, , Does not apply, q1800, Masters, Jake Church, [facility], Stopped at 09/29/17 1800  Allergies  Allergen Reactions  . Metolazone     Per pt family, pt gets delirious and confused when taking metolazone    Objective: Today's Vitals   09/29/17 1900 09/29/17 2200 09/29/17 2300 09/30/17 0545  BP: (!) 115/54  (!) 120/55 (!) 120/59  Pulse: 63   67  Resp: 18   18  Temp: (!) 97.5 F (36.4 C)  98.3 F (36.8 C) 98 F (36.7 C)  TempSrc: Oral  Oral Oral  SpO2: 100%   100%  Weight:      Height:      PainSc:  0-No pain      General: Well developed, nourished, in no acute distress, alert and oriented x3 with O2 in place  Dermatological: Skin is warm, dry and supple bilateral. Nails x 9 are well manicured; remaining integument appears unremarkable at this time. To left lateral foot at area of amputation the wound site is almost closed over with minimal depth <0.1cm with no clinical signs of infection. There is also a small wound at the dorsal midfoot that measures <0.5cm with granular base with no signs of infection present.  Vascular: Dorsalis Pedis artery and Posterior Tibial artery pedal pulses are 1/4 bilateral with immedate capillary fill time.No edema present bilateral.   Neruologic:  Grossly intact via light touch bilateral.  Musculoskeletal:S/p Left partial 5th ray amputation. Muscle strength 4/5 bilateral. No pain with palpation to foot or calf.   Results for orders placed or performed during the hospital encounter of 09/16/17 (from the past 24 hour(s))  Glucose, capillary     Status: Abnormal   Collection Time: 09/29/17  8:04 AM  Result Value Ref Range   Glucose-Capillary 128 (H) 65 - 99 mg/dL  Glucose, capillary     Status: Abnormal   Collection Time: 09/29/17 12:07 PM  Result Value Ref Range   Glucose-Capillary 191 (H) 65 - 99 mg/dL  Glucose, capillary     Status: Abnormal   Collection Time: 09/29/17  5:07 PM  Result Value Ref Range   Glucose-Capillary 150 (H) 65 - 99 mg/dL  Glucose, capillary     Status: Abnormal  Collection Time: 09/29/17 10:30 PM  Result Value Ref Range   Glucose-Capillary 100 (H) 65 - 99 mg/dL  Renal function panel     Status: Abnormal   Collection Time: 09/30/17  4:37 AM  Result Value Ref Range   Sodium 139 135 - 145 mmol/L   Potassium 3.9 3.5 - 5.1 mmol/L   Chloride 103 101 - 111 mmol/L   CO2 31 22 - 32 mmol/L   Glucose, Bld 111 (H) 65 - 99 mg/dL   BUN 55 (H) 6 - 20 mg/dL   Creatinine, Ser 1.83 (H) 0.61 - 1.24 mg/dL   Calcium 7.7 (L) 8.9 - 10.3 mg/dL   Phosphorus 2.2 (L) 2.5 - 4.6 mg/dL   Albumin 1.8 (L) 3.5 - 5.0 g/dL   GFR calc non Af Amer 35 (L) >60 mL/min   GFR calc Af Amer 41 (L) >60 mL/min   Anion gap 5 5 - 15  CBC     Status: Abnormal   Collection Time: 09/30/17  4:37 AM  Result Value Ref Range   WBC 9.0 4.0 - 10.5 K/uL   RBC 2.92 (L) 4.22 - 5.81 MIL/uL   Hemoglobin 8.7 (L) 13.0 - 17.0 g/dL   HCT 28.1 (L) 39.0 - 52.0 %   MCV 96.2 78.0 - 100.0 fL   MCH 29.8 26.0 - 34.0 pg   MCHC 31.0 30.0 - 36.0 g/dL   RDW 17.9 (H) 11.5 - 15.5 %   Platelets 283 150 - 400 K/uL  Magnesium     Status: None   Collection Time: 09/30/17  4:37 AM  Result Value Ref Range   Magnesium 2.1 1.7 - 2.4 mg/dL  Protime-INR     Status:  Abnormal   Collection Time: 09/30/17  4:37 AM  Result Value Ref Range   Prothrombin Time 24.7 (H) 11.4 - 15.2 seconds   INR 2.25     Xrays, Left foot  IMPRESSION: No objective evidence of acute or chronic osteomyelitis. Postsurgical changes of the fifth ray. Chronic deformity of the base of the proximal phalanx of the fourth toe. Probable cellulitis of the lateral aspect of the metatarsal region.   Assessment and Plan: 71 y.o. Diabetic male s/p Left partial 5th ray amputation with no signs of infection, healing well with no complications.  -Patient seen and evaluated -Chart reviewed -Xrays consistent with post op status  -There is no acute indication for antibiotics. No cellulitis. No infection in the left foot. No furthering testing is needed at this time for left foot.  -Continue with bactroban per wound care nursing and allevyn protective dressing -Orders placed for PT to work with gait, stability, and strengthening -Patient may weightbear as tolerated -Patient stable from podiatry point of view with no acute intervention needed at this time -Advised patient that after he is discharged from hospital or rehab to have his daughter to call the office for follow up appointment. Patient expressed understanding. -Consult appreciated -Re-consult if new problems or issues arise   Landis Martins, [place] [phone] office  [phone] cell

## 2017-09-30 NOTE — Social Work (Addendum)
CSW spoke with [facility]; they were inquiring about pt rectal tube and antibiotics.   CSW spoke with MD, rectal tube will be removed prior to discharge, antibiotics are deemed to be the most effective.   [facility] informed that pt will have rectal tube removed prior to discharge. [facility] looking into cost of antibiotic with administration, will call CSW back this afternoon. Facility aware that pt is possible discharge in 1-2 days.   2:15pm- CSW spoke with [facility]; they will cover his antibiotic upon discharge  2:17pm- CSW spoke with pt daughter; daughter still wants to me to look at [facility] (previously declined), just to see availability, otherwise okay with [facility]. Pt daughter would like more clarity regarding discharge timing.   CSW continuing to follow to support discharge when medically appropriate.   Alexander Mt, [place] Work [phone]

## 2017-09-30 NOTE — Progress Notes (Signed)
Nutrition Follow-up  DOCUMENTATION CODES:   Obesity unspecified  INTERVENTION:   -30 ml Prostat BID with meals, each supplement provides 100 kcals and 15 grams protein -MVI daily  NUTRITION DIAGNOSIS:   Increased nutrient needs related to wound healing as evidenced by estimated needs.  Ongoing  GOAL:   Patient will meet greater than or equal to 90% of their needs  Progressing  MONITOR:   PO intake, Supplement acceptance, Labs, Weight trends, Skin, I & O's  REASON FOR ASSESSMENT:   Consult (renal)  ASSESSMENT:   Christian Garner is a 71 y.o. male with medical history significant of congestive heart failure with an EF of 30% on home oxygen, coronary artery disease status post percutaneous coronary intervention greater than 1 year ago per patient records, atrial fibrillation chronically anticoagulated with warfarin, chronic kidney disease with a baseline creatinine of 1.3 did require hemodialysis during admission in January 2018, diabetes type 2 with chronic diabetic foot ulcer and renal disease who presents in transfer from [facility] for evaluation of acute on chronic renal failure.  12/7- rectal tube inserted 12/10- rectal tube removed 12/16- rectal tube replaced 12/19- rectal tube removed  Pt sleeping soundly at time of visit.   Spoke with RN, who reports pt has been progressing well. Pt is eating a little more, noted meal completion 25-75%. Per RN, pt consuming mostly fruits off of meal trays. RN also reported that wounds look better, as confirmed by [facility] notes.   Reviewed GI note from 09/27/17; pt remains with c-diff and trying to avoid fecal transplant if possible.  Labs reviewed: CBGS: Phos: 2.2, CBGS: 100-150 (inpatient orders for glycemic control are 0-9 units insulin aspart TID with meals).  Diet Order:  Diet regular Room service appropriate? Yes; Fluid consistency: Thin  EDUCATION NEEDS:   Not appropriate for education at this time  Skin:  Skin  Assessment: Skin Integrity Issues: Skin Integrity Issues:: Stage II, Other (Comment), Stage III Stage II: (MASD sacrum) Stage III: n/a Diabetic Ulcer: full thickness lt outer foot Other: rt leg venous stasis ulcer  Last BM:  09/30/17  Height:   Ht Readings from Last 1 Encounters:  09/16/17 6' (1.829 m)    Weight:   Wt Readings from Last 1 Encounters:  09/26/17 226 lb 13.7 oz (102.9 kg)    Ideal Body Weight:  80.9 kg  BMI:  Body mass index is 30.77 kg/m.  Estimated Nutritional Needs:   Kcal:  2200-2400  Protein:  125-140 grams  Fluid:  1-1.5 L    Chalese Peach A. Jimmye Norman, RD, LDN, CDE Pager: [phone] After hours Pager: [phone]

## 2017-10-01 ENCOUNTER — Encounter (HOSPITAL_COMMUNITY): Payer: Self-pay | Admitting: Student

## 2017-10-01 ENCOUNTER — Inpatient Hospital Stay (HOSPITAL_COMMUNITY): Payer: Medicare Other

## 2017-10-01 DIAGNOSIS — Z452 Encounter for adjustment and management of vascular access device: Secondary | ICD-10-CM | POA: Diagnosis not present

## 2017-10-01 DIAGNOSIS — I509 Heart failure, unspecified: Secondary | ICD-10-CM | POA: Diagnosis not present

## 2017-10-01 DIAGNOSIS — E1122 Type 2 diabetes mellitus with diabetic chronic kidney disease: Secondary | ICD-10-CM | POA: Diagnosis not present

## 2017-10-01 DIAGNOSIS — R0602 Shortness of breath: Secondary | ICD-10-CM | POA: Diagnosis not present

## 2017-10-01 DIAGNOSIS — N170 Acute kidney failure with tubular necrosis: Secondary | ICD-10-CM | POA: Diagnosis not present

## 2017-10-01 DIAGNOSIS — R262 Difficulty in walking, not elsewhere classified: Secondary | ICD-10-CM | POA: Diagnosis not present

## 2017-10-01 DIAGNOSIS — N179 Acute kidney failure, unspecified: Secondary | ICD-10-CM | POA: Diagnosis not present

## 2017-10-01 DIAGNOSIS — N184 Chronic kidney disease, stage 4 (severe): Secondary | ICD-10-CM | POA: Diagnosis not present

## 2017-10-01 DIAGNOSIS — D631 Anemia in chronic kidney disease: Secondary | ICD-10-CM | POA: Diagnosis not present

## 2017-10-01 DIAGNOSIS — Z9981 Dependence on supplemental oxygen: Secondary | ICD-10-CM | POA: Diagnosis not present

## 2017-10-01 DIAGNOSIS — N182 Chronic kidney disease, stage 2 (mild): Secondary | ICD-10-CM | POA: Diagnosis not present

## 2017-10-01 DIAGNOSIS — J9610 Chronic respiratory failure, unspecified whether with hypoxia or hypercapnia: Secondary | ICD-10-CM | POA: Diagnosis not present

## 2017-10-01 DIAGNOSIS — E11621 Type 2 diabetes mellitus with foot ulcer: Secondary | ICD-10-CM | POA: Diagnosis not present

## 2017-10-01 DIAGNOSIS — N189 Chronic kidney disease, unspecified: Secondary | ICD-10-CM | POA: Diagnosis not present

## 2017-10-01 DIAGNOSIS — E871 Hypo-osmolality and hyponatremia: Secondary | ICD-10-CM | POA: Diagnosis not present

## 2017-10-01 DIAGNOSIS — I5043 Acute on chronic combined systolic (congestive) and diastolic (congestive) heart failure: Secondary | ICD-10-CM | POA: Diagnosis not present

## 2017-10-01 DIAGNOSIS — L97529 Non-pressure chronic ulcer of other part of left foot with unspecified severity: Secondary | ICD-10-CM | POA: Diagnosis not present

## 2017-10-01 DIAGNOSIS — E1151 Type 2 diabetes mellitus with diabetic peripheral angiopathy without gangrene: Secondary | ICD-10-CM | POA: Diagnosis not present

## 2017-10-01 DIAGNOSIS — N390 Urinary tract infection, site not specified: Secondary | ICD-10-CM | POA: Diagnosis not present

## 2017-10-01 DIAGNOSIS — Z89422 Acquired absence of other left toe(s): Secondary | ICD-10-CM | POA: Diagnosis not present

## 2017-10-01 DIAGNOSIS — R0989 Other specified symptoms and signs involving the circulatory and respiratory systems: Secondary | ICD-10-CM | POA: Diagnosis not present

## 2017-10-01 DIAGNOSIS — Z741 Need for assistance with personal care: Secondary | ICD-10-CM | POA: Diagnosis not present

## 2017-10-01 DIAGNOSIS — E785 Hyperlipidemia, unspecified: Secondary | ICD-10-CM | POA: Diagnosis not present

## 2017-10-01 DIAGNOSIS — I2510 Atherosclerotic heart disease of native coronary artery without angina pectoris: Secondary | ICD-10-CM | POA: Diagnosis not present

## 2017-10-01 DIAGNOSIS — I480 Paroxysmal atrial fibrillation: Secondary | ICD-10-CM | POA: Diagnosis not present

## 2017-10-01 DIAGNOSIS — I517 Cardiomegaly: Secondary | ICD-10-CM | POA: Diagnosis not present

## 2017-10-01 DIAGNOSIS — J849 Interstitial pulmonary disease, unspecified: Secondary | ICD-10-CM | POA: Diagnosis not present

## 2017-10-01 DIAGNOSIS — Z7901 Long term (current) use of anticoagulants: Secondary | ICD-10-CM | POA: Diagnosis not present

## 2017-10-01 DIAGNOSIS — A0471 Enterocolitis due to Clostridium difficile, recurrent: Secondary | ICD-10-CM | POA: Diagnosis not present

## 2017-10-01 DIAGNOSIS — N183 Chronic kidney disease, stage 3 (moderate): Secondary | ICD-10-CM | POA: Diagnosis not present

## 2017-10-01 DIAGNOSIS — S98119A Complete traumatic amputation of unspecified great toe, initial encounter: Secondary | ICD-10-CM | POA: Diagnosis not present

## 2017-10-01 DIAGNOSIS — M6281 Muscle weakness (generalized): Secondary | ICD-10-CM | POA: Diagnosis not present

## 2017-10-01 DIAGNOSIS — F329 Major depressive disorder, single episode, unspecified: Secondary | ICD-10-CM | POA: Diagnosis not present

## 2017-10-01 DIAGNOSIS — Z5181 Encounter for therapeutic drug level monitoring: Secondary | ICD-10-CM | POA: Diagnosis not present

## 2017-10-01 DIAGNOSIS — B999 Unspecified infectious disease: Secondary | ICD-10-CM | POA: Diagnosis not present

## 2017-10-01 DIAGNOSIS — I482 Chronic atrial fibrillation: Secondary | ICD-10-CM | POA: Diagnosis not present

## 2017-10-01 HISTORY — PX: IR REMOVAL TUN CV CATH W/O FL: IMG2289

## 2017-10-01 LAB — RENAL FUNCTION PANEL
ALBUMIN: 2 g/dL — AB (ref 3.5–5.0)
ANION GAP: 3 — AB (ref 5–15)
BUN: 46 mg/dL — ABNORMAL HIGH (ref 6–20)
CO2: 33 mmol/L — AB (ref 22–32)
Calcium: 7.9 mg/dL — ABNORMAL LOW (ref 8.9–10.3)
Chloride: 104 mmol/L (ref 101–111)
Creatinine, Ser: 1.74 mg/dL — ABNORMAL HIGH (ref 0.61–1.24)
GFR calc Af Amer: 43 mL/min — ABNORMAL LOW (ref >60)
GFR calc non Af Amer: 37 mL/min — ABNORMAL LOW (ref >60)
GLUCOSE: 124 mg/dL — AB (ref 65–99)
PHOSPHORUS: 2.6 mg/dL (ref 2.5–4.6)
POTASSIUM: 3.8 mmol/L (ref 3.5–5.1)
Sodium: 140 mmol/L (ref 135–145)

## 2017-10-01 LAB — CBC
HCT: 29 % — ABNORMAL LOW (ref 39.0–52.0)
Hemoglobin: 8.8 g/dL — ABNORMAL LOW (ref 13.0–17.0)
MCH: 29.1 pg (ref 26.0–34.0)
MCHC: 30.3 g/dL (ref 30.0–36.0)
MCV: 96 fL (ref 78.0–100.0)
PLATELETS: 294 10*3/uL (ref 150–400)
RBC: 3.02 MIL/uL — ABNORMAL LOW (ref 4.22–5.81)
RDW: 17.7 % — ABNORMAL HIGH (ref 11.5–15.5)
WBC: 7.7 10*3/uL (ref 4.0–10.5)

## 2017-10-01 LAB — PROTIME-INR
INR: 2.29
Prothrombin Time: 25.1 seconds — ABNORMAL HIGH (ref 11.4–15.2)

## 2017-10-01 LAB — MAGNESIUM: MAGNESIUM: 2.2 mg/dL (ref 1.7–2.4)

## 2017-10-01 LAB — GLUCOSE, CAPILLARY
GLUCOSE-CAPILLARY: 105 mg/dL — AB (ref 65–99)
Glucose-Capillary: 105 mg/dL — ABNORMAL HIGH (ref 65–99)
Glucose-Capillary: 129 mg/dL — ABNORMAL HIGH (ref 65–99)

## 2017-10-01 MED ORDER — OXYCODONE HCL 5 MG PO TABS: 5.0000 mg | ORAL_TABLET | ORAL | 0 refills | Status: DC | PRN

## 2017-10-01 MED ORDER — LIDOCAINE HCL 1 % IJ SOLN
INTRAMUSCULAR | Status: AC
  Filled 2017-10-01: qty 20

## 2017-10-01 MED ORDER — WARFARIN SODIUM 2.5 MG PO TABS
2.5000 mg | ORAL_TABLET | Freq: Once | ORAL | Status: AC
  Administered 2017-10-01: 2.5 mg via ORAL
  Filled 2017-10-01: qty 1

## 2017-10-01 MED ORDER — CHLORHEXIDINE GLUCONATE 4 % EX LIQD
CUTANEOUS | Status: AC
  Filled 2017-10-01: qty 15

## 2017-10-01 MED ORDER — FIDAXOMICIN 200 MG PO TABS: 200.0000 mg | ORAL_TABLET | Freq: Two times a day (BID) | ORAL | 0 refills | Status: AC

## 2017-10-01 MED ORDER — GERHARDT'S BUTT CREAM: 1.0000 "application " | TOPICAL_CREAM | Freq: Two times a day (BID) | CUTANEOUS | Status: DC

## 2017-10-01 NOTE — Social Work (Addendum)
RN to call [facility] [phone]; CSW has left information in drawer next to pt room.  Report should be called to [facility] [phone] for 200 hall RN.  If RN can also call daughter Ledell Noss with discharge timing [phone].  CSW signing off. Please consult if any additional needs arise.  Alexander Mt, [place] Work [phone]

## 2017-10-01 NOTE — Social Work (Addendum)
[facility] returned CSW call, they are looking into whether or not they will be able to take pt.  This is family's other preference, they are okay with [facility] if [facility] cannot take pt.   As noted yesterday, [facility] aware and able to accommodate antibiotics. MD informed CSW that rectal tube will be removed prior to discharge.   [facility] cannot accommodate pt for discharge today; main concern is cost of antibiotic.   1:03pm- CSW has informed pt daughter that [facility] unable to take pt. Pt daughter okay with [facility]. MD also informed. Pt daughter aware if she would like to transition father to [facility] at later date that the facilities would support that.   CSW continuing to follow to provide support for discharge today to [facility].   Alexander Mt, [place] Work [phone]

## 2017-10-01 NOTE — Progress Notes (Signed)
[place] for warfarin Indication: atrial fibrillation  Allergies  Allergen Reactions  . Metolazone     Per pt family, pt gets delirious and confused when taking metolazone    Patient Measurements: Height: 6' (182.9 cm)(estimate d/t pt unable to answer) Weight: 226 lb (102.5 kg) IBW/kg (Calculated) : 77.6  Vital Signs: Temp: 98.1 F (36.7 C) (12/21 0508) Temp Source: Oral (12/21 0508) BP: 118/54 (12/21 0508) Pulse Rate: 67 (12/21 0508)  Labs: Recent Labs    09/29/17 0441 09/30/17 0437 10/01/17 0528  HGB 9.0* 8.7* 8.8*  HCT 29.5* 28.1* 29.0*  PLT 309 283 294  LABPROT 23.7* 24.7* 25.1*  INR 2.14 2.25 2.29  CREATININE 2.26* 1.83* 1.74*    Estimated Creatinine Clearance: 47.5 mL/min (A) (by C-G formula based on SCr of 1.74 mg/dL (H)).   Assessment: Warfarin PTA for Afib. All anticoagulation was held due to patient having melena during this admission. He required prbc on 12/15. His INR remained elevated 12/8-12/11 despite not having any warfarin. His GI bleed has resolved, warfarin has been resumed.   INR 2.19 on 12/18 and no warfarin has been given recently - this INR resulted after warfarin 4 mg given. Today, INR is up at 2.29. Flagyl has been discontinued. Patient remains on Amiodarone which is also a home med. Patient's po intake remains poor at 25% likely also contributing to increased INR and requirement for lower dosing.  PTA warfarin dosing: 4.5 mg daily  Goal of Therapy:  INR 2-3 Monitor platelets by anticoagulation protocol: Yes   Plan:  Warfarin 2.5 mg po x1 tonight  Daily INR Monitor for s/sx of bleeding   Sloan Leiter, PharmD, BCPS, BCCCP Clinical Pharmacist Clinical phone 10/01/2017 until 3:30PM - #[phone] After hours, please call #[phone] 10/01/2017 10:23 AM

## 2017-10-01 NOTE — Clinical Social Work Placement (Signed)
   CLINICAL SOCIAL WORK PLACEMENT  NOTE [place]  Date:  10/01/2017  Patient Details  Name: Christian Garner MRN: [id number] Date of Birth: 18-Feb-1946  Clinical Social Work is seeking post-discharge placement for this patient at the [place] level of care (*CSW will initial, date and re-position this form in  chart as items are completed):  Yes   Patient/family provided with [place] Work Department's list of facilities offering this level of care within the geographic area requested by the patient (or if unable, by the patient's family).  Yes   Patient/family informed of their freedom to choose among providers that offer the needed level of care, that participate in Medicare, Medicaid or managed care program needed by the patient, have an available bed and are willing to accept the patient.  Yes   Patient/family informed of [place]'s ownership interest in [facility] and [facility], as well as of the fact that they are under no obligation to receive care at these facilities.  PASRR submitted to EDS on 09/19/17     PASRR number received on       Existing PASRR number confirmed on 09/19/17     FL2 transmitted to all facilities in geographic area requested by pt/family on 09/19/17     FL2 transmitted to all facilities within larger geographic area on       Patient informed that his/her managed care company has contracts with or will negotiate with certain facilities, including the following:        Yes   Patient/family informed of bed offers received.  Patient chooses bed at Other - please specify in the comment section below:([facility])     Physician recommends and patient chooses bed at      Patient to be transferred to Other - please specify in the comment section below:([facility]) on 10/01/17.  Patient to be transferred to facility by [facility]     Patient family notified on 10/01/17 of  transfer.  Name of family member notified:  Devlon Dosher, daughter     PHYSICIAN       Additional Comment:    _______________________________________________ Alexander Mt, [place] 10/01/2017, 3:35 PM

## 2017-10-01 NOTE — Progress Notes (Signed)
Physical Therapy Treatment Patient Details Name: Christian Garner MRN: [id number] DOB: 04-07-1946 Today's Date: 10/01/2017    History of Present Illness 71 y.o. male with history of systolic CHF with an EF of 30% on home oxygen, CAD s/p PTCA greater than 1 year ago, a-fib chronically anticoagulated with warfarin, CKD with a baseline creatinine of 1.3 (hemodialysis during admission in January 2018), and DM2 with chronic diabetic foot ulcer who presented in transfer from [name] on 09/16/17 for evaluation of acute on chronic renal failure. Pt found to have sepsis due to c diff.    PT Comments    Patient received in bed, pleasant and willing to participate in skilled PT services this afternoon. Able to complete bed mobility with min guard and Mod VC today; some dizziness after coming from supine to sit but this resolved with extended time in sitting. Practiced sit to stand x2 at edge of bed today, patient able to maintain for approximately 1 minute before needing to return to sitting due to fatigue and LE weakness. Patient declined ambulation this session due to not having assistance of +2. Patient left in bed with all needs met and questions/concerns addressed, bed alarm activated.     Follow Up Recommendations  SNF     Equipment Recommendations  None recommended by PT    Recommendations for Other Services       Precautions / Restrictions Precautions Precautions: Fall Precaution Comments: now full WB on L foot Restrictions Weight Bearing Restrictions: No    Mobility  Bed Mobility Overal bed mobility: Needs Assistance Bed Mobility: Rolling;Sidelying to Sit Rolling: Min guard Sidelying to sit: Min guard       General bed mobility comments: follows cues well, able to perform safely with min guard   Transfers Overall transfer level: Needs assistance Equipment used: Rolling walker (2 wheeled) Transfers: Sit to/from Stand Sit to Stand: Min guard         General  transfer comment: able to stand with min guard, cues for safety and pacing, no significant unsteadiness noted   Ambulation/Gait             General Gait Details: DNT, patient declined to ambulate without assist of +2    Stairs            Wheelchair Mobility    Modified Rankin (Stroke Patients Only)       Balance Overall balance assessment: Needs assistance Sitting-balance support: Feet supported;Bilateral upper extremity supported Sitting balance-Leahy Scale: Good     Standing balance support: Bilateral upper extremity supported;During functional activity Standing balance-Leahy Scale: Fair Standing balance comment: reliant on UE support                             Cognition Arousal/Alertness: Awake/alert Behavior During Therapy: WFL for tasks assessed/performed Overall Cognitive Status: Within Functional Limits for tasks assessed                                 General Comments: cognition and command following appear intact today       Exercises      General Comments        Pertinent Vitals/Pain Pain Assessment: No/denies pain Pain Intervention(s): Monitored during session    Home Living                      Prior Function  PT Goals (current goals can now be found in the care plan section) Acute Rehab PT Goals Patient Stated Goal: none stated PT Goal Formulation: With patient Time For Goal Achievement: 10/02/17 Potential to Achieve Goals: Fair Progress towards PT goals: Progressing toward goals    Frequency    Min 2X/week      PT Plan Current plan remains appropriate    Co-evaluation              AM-PAC PT "6 Clicks" Daily Activity  Outcome Measure  Difficulty turning over in bed (including adjusting bedclothes, sheets and blankets)?: Unable Difficulty moving from lying on back to sitting on the side of the bed? : Unable Difficulty sitting down on and standing up from a chair with  arms (e.g., wheelchair, bedside commode, etc,.)?: Unable Help needed moving to and from a bed to chair (including a wheelchair)?: A Little Help needed walking in hospital room?: A Little Help needed climbing 3-5 steps with a railing? : A Lot 6 Click Score: 11    End of Session Equipment Utilized During Treatment: Oxygen;Gait belt Activity Tolerance: Patient tolerated treatment well Patient left: in bed;with bed alarm set;with call bell/phone within reach   PT Visit Diagnosis: Unsteadiness on feet (R26.81);Other abnormalities of gait and mobility (R26.89);Muscle weakness (generalized) (M62.81)     Time: 5364-6803 PT Time Calculation (min) (ACUTE ONLY): 33 min  Charges:  $Therapeutic Activity: 23-37 mins                    G Codes:       Deniece Ree PT, DPT, CBIS  Supplemental Physical Therapist [place]

## 2017-10-01 NOTE — Progress Notes (Signed)
Removed tunneled PICC line.  No complications.  Patient tolerated well. Dressing applied over site.  Brynda Greathouse, MS RD PA-C

## 2017-10-01 NOTE — Progress Notes (Signed)
Received call from [facility] care after patients arrival. They had questions about patients Coumadin dosing. They felt discharge summary unclear. Pharmacy called and clarification given. Per pharmacy patient should receive 2 mg dose Saturday and Sunday due to the fact that patient had been on flagyl during hospital stay and it can increase INR. On Monday an INR should be drawn and physician at SNF should dose following doses.  Nurse given number and encouraged to call if she had anymore questions.  Jimmie Molly, RN

## 2017-10-01 NOTE — Progress Notes (Signed)
PT Cancellation Note  Patient Details Name: CAELIN RAYL MRN: [id number] DOB: 10/26/1945   Cancelled Treatment:    Reason Eval/Treat Not Completed: Patient declined, no reason specified patient requesting that PT come back later in afternoon as he is finishing lunch.    Deniece Ree PT, DPT, CBIS  Supplemental Physical Therapist [facility]

## 2017-10-01 NOTE — Progress Notes (Signed)
Report called to [place], Therapist, sports at [employer]. Patient discharged by [facility] in good condition.

## 2017-10-01 NOTE — Discharge Summary (Signed)
Physician Discharge Summary  Christian Garner:[id number] DOB: 11/17/1945 DOA: 09/16/2017  PCP: [name], MD  Admit date: 09/16/2017 Discharge date: 10/01/2017  Admitted From: [facility]  Disposition:  [facility]   Recommendations for Outpatient Follow-up:  1. Please obtain BMP/CBC/INR in one week 2. Please complete Difcid prescription on 10/06/17 3. Please see the patient's primary cardiologist in follow up in 2-4 weeks, call for appointment 4. Please call [facility] for follow up in 2-4 weeks regarding fecal transplant 5. Follow up with Dr. Haywood Filler office for foot wound as previously scheduled     Home Health: None  Equipment/Devices: Wheeled walker  Discharge Condition: Weak, debilitated, improving  CODE STATUS: FULL Diet recommendation: Diet diabetic, cardiac  Brief/Interim Summary: 71 yo M with CHF EF 30% and chronic respiratory failure on home O2, CAD s/p PTCA greater than 1 year ago, Afib on warfarin, CKD III baseline Cr  1.3 and NIDDM with recent diabetic foot ulcer s/p amputation of 5th ray who presented to OSH with sepsis, found to have Cdiff colitis and transferred to [facility] due to renal failure, possibly requiring dialysis.    Severe C diff colitis Initially treated with oral vancomycin, then IV Flagyl added given concern for transient ileus.  Partial response.  On 12/16, transitioned to oral Fidaxomicin alone, and had good response, stools became formed, WBC resolved.  Discharged to complete 10 days total fidaxomicin.  AVOID ANTIBIOTICS IN THIS PATIENT.   Acute renal failure Baseline Cr 1.3-1.7.   Did require brief HD during an episode of renal failure in January but during this hospitalization, none was needed (a PICC line was placed temporarily for possible dialysis but removed before discharge). While here, his renal failure was treated conservatively, he did not require dialysis, and his creatinine trended to 1.7 on the day of  discharge.  Acute on chronic systolic and diastolic CHF Diuresed with IV lasix.  Euvolemic on day of discharge, resumed home Bumex.  Diabetic foot infection This is resolved.  Wound care per Dr. Haywood Filler office.  AVOID ANTIBIOTICS IN THIS PATIENT.          Discharge Diagnoses:  Principal Problem:   Recurrent Clostridium difficile diarrhea Active Problems:   Acute kidney injury superimposed on chronic kidney disease (HCC)   Acute on chronic combined systolic and diastolic CHF (congestive heart failure) (HCC)   AF (paroxysmal atrial fibrillation) (HCC)   Pressure injury of skin   Hyponatremia   CAD (coronary artery disease)   C. difficile colitis   Warfarin anticoagulation   Type 2 diabetes mellitus with left diabetic foot ulcer (HCC)   Anemia due to chronic kidney disease   CKD (chronic kidney disease), stage III (HCC)   CKD (chronic kidney disease) stage 4, GFR 15-29 ml/min (HCC)   Vomiting   Diabetic foot ulcer ([place])    Discharge Instructions  Discharge Instructions    Diet Carb Modified   Complete by:  As directed    Discharge instructions   Complete by:  As directed    Finish 5 more days fidaxomicin twice daily, to complete course 10/06/17.  If more than 6 liquid stools per day, seek medical care.  Check BMP in 1 week. Check INR in 1 week. Check CBC in 1 week.  Foam dressing to left anterior foot.  Change daily or as needed for soiling.   Increase activity slowly   Complete by:  As directed      Allergies as of 10/01/2017  Reactions   Metolazone    Per pt family, pt gets delirious and confused when taking metolazone      Medication List    STOP taking these medications   diphenoxylate-atropine 2.5-0.025 MG tablet Commonly known as:  LOMOTIL   metolazone 5 MG tablet Commonly known as:  ZAROXOLYN     TAKE these medications   acetaminophen 325 MG tablet Commonly known as:  TYLENOL Take 975 mg by mouth at bedtime as needed for  mild pain.   amiodarone 200 MG tablet Commonly known as:  PACERONE Take 1 tablet (200 mg total) by mouth 2 (two) times daily.   atorvastatin 40 MG tablet Commonly known as:  LIPITOR Take 40 mg by mouth at bedtime.   bismuth subsalicylate 370 WU/88BV suspension Commonly known as:  PEPTO BISMOL Take 30 mLs by mouth every 6 (six) hours as needed for indigestion.   bumetanide 2 MG tablet Commonly known as:  BUMEX Take 2 tablets (4 mg total) by mouth 2 (two) times daily. What changed:  how much to take   calcium carbonate 500 MG chewable tablet Commonly known as:  TUMS - dosed in mg elemental calcium Chew 1 tablet by mouth 4 (four) times daily as needed for indigestion or heartburn.   ferrous sulfate 325 (65 FE) MG tablet Take 325 mg by mouth daily with breakfast.   fidaxomicin 200 MG Tabs tablet Commonly known as:  DIFICID Take 1 tablet (200 mg total) by mouth 2 (two) times daily for 5 days.   fluticasone 50 MCG/ACT nasal spray Commonly known as:  FLONASE Place 1 spray into both nostrils daily as needed for allergies or rhinitis.   Gerhardt's butt cream Crea Apply 1 application topically 2 (two) times daily.   nitroGLYCERIN 0.4 MG SL tablet Commonly known as:  NITROSTAT Place 0.4 mg under the tongue every 5 (five) minutes as needed for chest pain.   oxyCODONE 5 MG immediate release tablet Commonly known as:  Oxy IR/ROXICODONE Take 1 tablet (5 mg total) by mouth every 4 (four) hours as needed for moderate pain.   potassium chloride SA 20 MEQ tablet Commonly known as:  K-DUR,KLOR-CON Take 2 tablets (40 mEq total) by mouth 2 (two) times daily.   sitaGLIPtin 100 MG tablet Commonly known as:  JANUVIA Take 100 mg by mouth daily.   traZODone 50 MG tablet Commonly known as:  DESYREL Take 50 mg by mouth at bedtime.   vitamin B-12 1000 MCG tablet Commonly known as:  CYANOCOBALAMIN Take 1,000 mcg by mouth daily.   warfarin 1 MG tablet Commonly known as:  COUMADIN Take  1 mg by mouth daily. Take 1/2 tablet (0.26m) by mouth once daily with 469mtablet to equal 4.28m34maily What changed:  Another medication with the same name was changed. Make sure you understand how and when to take each.   warfarin 2 MG tablet Commonly known as:  COUMADIN Take 1 tablet (2 mg total) by mouth daily. What changed:    how much to take  when to take this  additional instructions      Contact information for after-discharge care    Destination    [facility]   Service:  Skilled Nursing Contact information: [phone]          Allergies  Allergen Reactions  . Metolazone     Per pt family, pt gets delirious and confused when taking metolazone  Consultations:  GI  ID  Cardiology   Procedures/Studies: Dg Abd 1 View  Result Date: 09/28/2017 CLINICAL DATA:  Abdominal pain and vomiting tonight. EXAM: ABDOMEN - 1 VIEW COMPARISON:  Radiograph 09/23/2017. FINDINGS: No bowel dilatation to suggest obstruction. Previous colonic edema is not visualized on the current exam. No evidence of free air on supine views. There are vascular calcifications. Pelvic calcifications are prostatic. IMPRESSION: Colonic edema on prior radiographs is not visualized. No bowel dilatation. No evidence of free air. Electronically Signed   By: Jeb Levering M.D.   On: 09/28/2017 01:14   Dg Abd 1 View  Result Date: 09/23/2017 CLINICAL DATA:  Celsius difficile colitis. Rule out toxic megacolon. Bloody diarrhea with little improvement. EXAM: ABDOMEN - 1 VIEW COMPARISON:  CT 06/25/2017, radiographs 12/27/2016 FINDINGS: The colon is not significantly dilated and no loss of haustral markings is noted, ruling out toxic megacolon. There is some mucosal thickening and prominence of the haustral folds of the colon in keeping with patient's known Celsius difficile colitis. No significant small bowel dilatation. There is  no free air. IMPRESSION: Submucosal edema and thickening of the haustral markings of the colon consistent with known colitis. No findings of toxic megacolon. Electronically Signed   By: Ashley Royalty M.D.   On: 09/23/2017 19:05   Ir Fluoro Guide Cv Line Right  Result Date: 09/27/2017 INDICATION: History of chronic kidney disease and poor venous access. In need of durable intravenous access for medication administration and blood draws. EXAM: TUNNELED PICC LINE WITH ULTRASOUND AND FLUOROSCOPIC GUIDANCE MEDICATIONS: Patient is currently admitted to the hospital and receiving intravenous antibiotics. The antibiotic was given in an appropriate time interval prior to skin puncture. ANESTHESIA/SEDATION: None FLUOROSCOPY TIME:  18 seconds (2 mGy) COMPLICATIONS: None immediate. PROCEDURE: Informed written consent was obtained from the patient's wife after a discussion of the risks, benefits, and alternatives to treatment. Questions regarding the procedure were encouraged and answered. The right neck and chest were prepped with chlorhexidine in a sterile fashion, and a sterile drape was applied covering the operative field. Maximum barrier sterile technique with sterile gowns and gloves were used for the procedure. A timeout was performed prior to the initiation of the procedure. After the overlying soft tissues were anesthetized, a small venotomy incision was created and a micropuncture kit was utilized to access the internal jugular vein. Real-time ultrasound guidance was utilized for vascular access including the acquisition of a permanent ultrasound image documenting patency of the accessed vessel. The microwire was utilized to measure appropriate catheter length. The micropuncture sheath was exchanged for a peel-away sheath over a guidewire. A 5 French dual lumen tunneled PICC measuring 24 cm was tunneled in a retrograde fashion from the anterior chest wall to the venotomy incision. The catheter was then placed  through the peel-away sheath with tip ultimately positioned at the superior caval-atrial junction. Final catheter positioning was confirmed and documented with a spot radiographic image. The catheter aspirates and flushes normally. The catheter was flushed with appropriate volume heparin dwells. The catheter exit site was secured with a 0-Prolene retention suture. The venotomy incision was closed with Dermabond and Steri-strips. Dressings were applied. The patient tolerated the procedure well without immediate post procedural complication. FINDINGS: After catheter placement, the tip lies within the superior cavoatrial junction. The catheter aspirates and flushes normally and is ready for immediate use. IMPRESSION: Successful placement of 24 cm dual lumen tunneled PICC catheter via the right internal jugular vein with tip terminating at the superior caval  atrial junction. The catheter is ready for immediate use. Electronically Signed   By: Sandi Mariscal M.D.   On: 09/27/2017 10:02   Ir US Guide Vasc Access Right  Result Date: 09/27/2017 INDICATION: History of chronic kidney disease and poor venous access. In need of durable intravenous access for medication administration and blood draws. EXAM: TUNNELED PICC LINE WITH ULTRASOUND AND FLUOROSCOPIC GUIDANCE MEDICATIONS: Patient is currently admitted to the hospital and receiving intravenous antibiotics. The antibiotic was given in an appropriate time interval prior to skin puncture. ANESTHESIA/SEDATION: None FLUOROSCOPY TIME:  18 seconds (2 mGy) COMPLICATIONS: None immediate. PROCEDURE: Informed written consent was obtained from the patient's wife after a discussion of the risks, benefits, and alternatives to treatment. Questions regarding the procedure were encouraged and answered. The right neck and chest were prepped with chlorhexidine in a sterile fashion, and a sterile drape was applied covering the operative field. Maximum barrier sterile technique with sterile  gowns and gloves were used for the procedure. A timeout was performed prior to the initiation of the procedure. After the overlying soft tissues were anesthetized, a small venotomy incision was created and a micropuncture kit was utilized to access the internal jugular vein. Real-time ultrasound guidance was utilized for vascular access including the acquisition of a permanent ultrasound image documenting patency of the accessed vessel. The microwire was utilized to measure appropriate catheter length. The micropuncture sheath was exchanged for a peel-away sheath over a guidewire. A 5 French dual lumen tunneled PICC measuring 24 cm was tunneled in a retrograde fashion from the anterior chest wall to the venotomy incision. The catheter was then placed through the peel-away sheath with tip ultimately positioned at the superior caval-atrial junction. Final catheter positioning was confirmed and documented with a spot radiographic image. The catheter aspirates and flushes normally. The catheter was flushed with appropriate volume heparin dwells. The catheter exit site was secured with a 0-Prolene retention suture. The venotomy incision was closed with Dermabond and Steri-strips. Dressings were applied. The patient tolerated the procedure well without immediate post procedural complication. FINDINGS: After catheter placement, the tip lies within the superior cavoatrial junction. The catheter aspirates and flushes normally and is ready for immediate use. IMPRESSION: Successful placement of 24 cm dual lumen tunneled PICC catheter via the right internal jugular vein with tip terminating at the superior caval atrial junction. The catheter is ready for immediate use. Electronically Signed   By: Sandi Mariscal M.D.   On: 09/27/2017 10:02   Portable Chest 1 View  Result Date: 09/16/2017 CLINICAL DATA:  Dyspnea.  Altered mental status. EXAM: PORTABLE CHEST 1 VIEW COMPARISON:  09/13/2017. FINDINGS: The heart is enlarged. There  is worsening aeration, with increasing BILATERAL pulmonary opacities, BILATERAL pleural effusions, RIGHT greater than LEFT, likely to represent pulmonary edema. BILATERAL pneumonia is less favored. No osseous findings. IMPRESSION: Marked worsening aeration. Development of Cardiomegaly with BILATERAL pulmonary opacities, RIGHT greater than LEFT, likely pulmonary edema. Electronically Signed   By: Staci Righter M.D.   On: 09/16/2017 09:53   Dg Foot 2 Views Left  Result Date: 09/28/2017 CLINICAL DATA:  Possible diabetic foot ulcer. The patient has undergone amputation of the fifth toe. EXAM: LEFT FOOT - 2 VIEW COMPARISON:  Rule left foot series dated April 07, 2017 FINDINGS: The patient is undergone removal of the fifth toe and distal 1/2 of the fifth metatarsal. The remaining bones are subjectively osteopenic. No definite loss of the sharp cortical margins of any of the toes or metatarsals is observed.  The stump of the fifth metatarsal is grossly intact. There is chronic deformity of the base of the fourth proximal phalanx. The bones of the hindfoot are grossly intact. There are plantar and Achilles region calcaneal spurs. There is mild soft tissue swelling over the dorsum of the forefoot. There may be gas within the soft tissues over the lateral aspect of the metatarsal region adjacent to the fourth metatarsal. IMPRESSION: No objective evidence of acute or chronic osteomyelitis. Postsurgical changes of the fifth ray. Chronic deformity of the base of the proximal phalanx of the fourth toe. Probable cellulitis of the lateral aspect of the metatarsal region. Electronically Signed   By: David  Martinique M.D.   On: 09/28/2017 11:14      Subjective: Feels well.  STrengthening.  Stools formed today, just one.  No fever, abdominal pain, confusion.  Urine output good.  Discharge Exam: Vitals:   09/30/17 2144 10/01/17 0508  BP: (!) 130/58 (!) 118/54  Pulse: 74 67  Resp: 16 16  Temp: 98.2 F (36.8 C) 98.1 F  (36.7 C)  SpO2: 94% 97%   Vitals:   09/30/17 1706 09/30/17 2144 10/01/17 0500 10/01/17 0508  BP:  (!) 130/58  (!) 118/54  Pulse: 74 74  67  Resp:  16  16  Temp:  98.2 F (36.8 C)  98.1 F (36.7 C)  TempSrc:  Oral  Oral  SpO2: 96% 94%  97%  Weight:   102.5 kg (226 lb)   Height:        General: Pt is alert, awake, not in acute distress, appears weak but moves all extremities Cardiovascular: RRR, S1/S2 +, no rubs, no gallops Respiratory: CTA bilaterally, no wheezing, no rhonchi Abdominal: Soft, NT, ND, bowel sounds + Extremities: no edema, no cyanosis    The results of significant diagnostics from this hospitalization (including imaging, microbiology, ancillary and laboratory) are listed below for reference.     Microbiology: No results found for this or any previous visit (from the past 240 hour(s)).   Labs: BNP (last 3 results) Recent Labs    12/29/16 0355 04/12/17 1440 06/07/17 1026  BNP 236.2* 1,244.8* 081.4*   Basic Metabolic Panel: Recent Labs  Lab 09/27/17 0541 09/28/17 0537 09/29/17 0441 09/30/17 0437 10/01/17 0528  NA 137 138 139 139 140  K 3.9 4.2 3.9 3.9 3.8  CL 100* 103 101 103 104  CO2 30 30 30 31  33*  GLUCOSE 110* 119* 128* 111* 124*  BUN 110* 99* 84* 55* 46*  CREATININE 2.87* 2.69* 2.26* 1.83* 1.74*  CALCIUM 7.9* 7.8* 7.8* 7.7* 7.9*  MG 2.6* 2.6* 2.4 2.1 2.2  PHOS 3.3 3.7 2.7 2.2* 2.6   Liver Function Tests: Recent Labs  Lab 09/27/17 0541 09/28/17 0537 09/29/17 0441 09/30/17 0437 10/01/17 0528  ALBUMIN 1.8* 2.0* 1.8* 1.8* 2.0*   No results for input(s): LIPASE, AMYLASE in the last 168 hours. No results for input(s): AMMONIA in the last 168 hours. CBC: Recent Labs  Lab 09/24/17 1913  09/25/17 1646  09/27/17 0541 09/28/17 0537 09/29/17 0441 09/30/17 0437 10/01/17 0528  WBC 18.8*   < > 15.7*   < > 13.0* 13.1* 8.9 9.0 7.7  NEUTROABS 15.9*  --  13.3*  --   --   --   --   --   --   HGB 7.9*   < > 8.2*   < > 9.2* 9.2* 9.0* 8.7*  8.8*  HCT 25.7*   < > 26.4*   < > 29.8*  29.3* 29.5* 28.1* 29.0*  MCV 94.8   < > 93.3   < > 93.1 94.5 95.5 96.2 96.0  PLT 249   < > 250   < > 314 315 309 283 294   < > = values in this interval not displayed.   Cardiac Enzymes: No results for input(s): CKTOTAL, CKMB, CKMBINDEX, TROPONINI in the last 168 hours. BNP: Invalid input(s): POCBNP CBG: Recent Labs  Lab 09/30/17 1222 09/30/17 1705 09/30/17 2142 10/01/17 0750 10/01/17 1218  GLUCAP 114* 111* 146* 105* 129*   D-Dimer No results for input(s): DDIMER in the last 72 hours. Hgb A1c No results for input(s): HGBA1C in the last 72 hours. Lipid Profile No results for input(s): CHOL, HDL, LDLCALC, TRIG, CHOLHDL, LDLDIRECT in the last 72 hours. Thyroid function studies No results for input(s): TSH, T4TOTAL, T3FREE, THYROIDAB in the last 72 hours.  Invalid input(s): FREET3 Anemia work up No results for input(s): VITAMINB12, FOLATE, FERRITIN, TIBC, IRON, RETICCTPCT in the last 72 hours. Urinalysis    Component Value Date/Time   COLORURINE YELLOW 09/16/2017 1753   APPEARANCEUR HAZY (A) 09/16/2017 1753   LABSPEC >1.030 (H) 09/16/2017 1753   PHURINE 5.0 09/16/2017 1753   GLUCOSEU NEGATIVE 09/16/2017 1753   HGBUR MODERATE (A) 09/16/2017 1753   BILIRUBINUR NEGATIVE 09/16/2017 1753   KETONESUR NEGATIVE 09/16/2017 1753   PROTEINUR NEGATIVE 09/16/2017 1753   NITRITE NEGATIVE 09/16/2017 1753   LEUKOCYTESUR NEGATIVE 09/16/2017 1753   Sepsis Labs Invalid input(s): PROCALCITONIN,  WBC,  LACTICIDVEN Microbiology No results found for this or any previous visit (from the past 240 hour(s)).   Time coordinating discharge: Over 30 minutes  SIGNED:   Edwin Dada, MD  [facility] 10/01/2017, 3:24 PM   If 7PM-7AM, please contact night-coverage [email or web address] Password [email or web address]

## 2017-10-02 DIAGNOSIS — A0471 Enterocolitis due to Clostridium difficile, recurrent: Secondary | ICD-10-CM | POA: Diagnosis not present

## 2017-10-02 DIAGNOSIS — E1151 Type 2 diabetes mellitus with diabetic peripheral angiopathy without gangrene: Secondary | ICD-10-CM | POA: Diagnosis not present

## 2017-10-02 DIAGNOSIS — I482 Chronic atrial fibrillation: Secondary | ICD-10-CM | POA: Diagnosis not present

## 2017-10-02 DIAGNOSIS — I509 Heart failure, unspecified: Secondary | ICD-10-CM | POA: Diagnosis not present

## 2017-10-02 DIAGNOSIS — N179 Acute kidney failure, unspecified: Secondary | ICD-10-CM | POA: Diagnosis not present

## 2017-10-20 DIAGNOSIS — R262 Difficulty in walking, not elsewhere classified: Secondary | ICD-10-CM | POA: Diagnosis not present

## 2017-10-20 DIAGNOSIS — E871 Hypo-osmolality and hyponatremia: Secondary | ICD-10-CM | POA: Diagnosis not present

## 2017-10-20 DIAGNOSIS — D649 Anemia, unspecified: Secondary | ICD-10-CM | POA: Diagnosis not present

## 2017-10-20 DIAGNOSIS — Z89422 Acquired absence of other left toe(s): Secondary | ICD-10-CM | POA: Diagnosis not present

## 2017-10-20 DIAGNOSIS — I509 Heart failure, unspecified: Secondary | ICD-10-CM | POA: Diagnosis not present

## 2017-10-20 DIAGNOSIS — N184 Chronic kidney disease, stage 4 (severe): Secondary | ICD-10-CM | POA: Diagnosis not present

## 2017-10-20 DIAGNOSIS — E11621 Type 2 diabetes mellitus with foot ulcer: Secondary | ICD-10-CM | POA: Diagnosis not present

## 2017-10-20 DIAGNOSIS — I5043 Acute on chronic combined systolic (congestive) and diastolic (congestive) heart failure: Secondary | ICD-10-CM | POA: Diagnosis not present

## 2017-10-20 DIAGNOSIS — I480 Paroxysmal atrial fibrillation: Secondary | ICD-10-CM | POA: Diagnosis not present

## 2017-10-20 DIAGNOSIS — I4891 Unspecified atrial fibrillation: Secondary | ICD-10-CM | POA: Diagnosis not present

## 2017-10-20 DIAGNOSIS — A0471 Enterocolitis due to Clostridium difficile, recurrent: Secondary | ICD-10-CM | POA: Diagnosis not present

## 2017-10-22 DIAGNOSIS — R262 Difficulty in walking, not elsewhere classified: Secondary | ICD-10-CM | POA: Diagnosis not present

## 2017-10-22 DIAGNOSIS — D649 Anemia, unspecified: Secondary | ICD-10-CM | POA: Diagnosis not present

## 2017-10-22 DIAGNOSIS — I509 Heart failure, unspecified: Secondary | ICD-10-CM | POA: Diagnosis not present

## 2017-10-22 DIAGNOSIS — I4891 Unspecified atrial fibrillation: Secondary | ICD-10-CM | POA: Diagnosis not present

## 2017-11-04 DIAGNOSIS — E1122 Type 2 diabetes mellitus with diabetic chronic kidney disease: Secondary | ICD-10-CM | POA: Diagnosis not present

## 2017-11-04 DIAGNOSIS — D631 Anemia in chronic kidney disease: Secondary | ICD-10-CM | POA: Diagnosis not present

## 2017-11-04 DIAGNOSIS — I5043 Acute on chronic combined systolic (congestive) and diastolic (congestive) heart failure: Secondary | ICD-10-CM | POA: Diagnosis not present

## 2017-11-04 DIAGNOSIS — N184 Chronic kidney disease, stage 4 (severe): Secondary | ICD-10-CM | POA: Diagnosis not present

## 2017-11-04 DIAGNOSIS — E1142 Type 2 diabetes mellitus with diabetic polyneuropathy: Secondary | ICD-10-CM | POA: Diagnosis not present

## 2017-11-04 DIAGNOSIS — I130 Hypertensive heart and chronic kidney disease with heart failure and stage 1 through stage 4 chronic kidney disease, or unspecified chronic kidney disease: Secondary | ICD-10-CM | POA: Diagnosis not present

## 2017-11-04 DIAGNOSIS — Z4781 Encounter for orthopedic aftercare following surgical amputation: Secondary | ICD-10-CM | POA: Diagnosis not present

## 2017-11-04 DIAGNOSIS — Z89422 Acquired absence of other left toe(s): Secondary | ICD-10-CM | POA: Diagnosis not present

## 2017-11-04 DIAGNOSIS — I482 Chronic atrial fibrillation: Secondary | ICD-10-CM | POA: Diagnosis not present

## 2017-11-04 DIAGNOSIS — I2510 Atherosclerotic heart disease of native coronary artery without angina pectoris: Secondary | ICD-10-CM | POA: Diagnosis not present

## 2017-11-04 DIAGNOSIS — Z87891 Personal history of nicotine dependence: Secondary | ICD-10-CM | POA: Diagnosis not present

## 2017-11-04 DIAGNOSIS — J9610 Chronic respiratory failure, unspecified whether with hypoxia or hypercapnia: Secondary | ICD-10-CM | POA: Diagnosis not present

## 2017-11-04 DIAGNOSIS — E1151 Type 2 diabetes mellitus with diabetic peripheral angiopathy without gangrene: Secondary | ICD-10-CM | POA: Diagnosis not present

## 2017-11-04 DIAGNOSIS — Z7984 Long term (current) use of oral hypoglycemic drugs: Secondary | ICD-10-CM | POA: Diagnosis not present

## 2017-11-04 DIAGNOSIS — Z8631 Personal history of diabetic foot ulcer: Secondary | ICD-10-CM | POA: Diagnosis not present

## 2017-11-04 DIAGNOSIS — Z7901 Long term (current) use of anticoagulants: Secondary | ICD-10-CM | POA: Diagnosis not present

## 2017-11-08 DIAGNOSIS — E1142 Type 2 diabetes mellitus with diabetic polyneuropathy: Secondary | ICD-10-CM | POA: Diagnosis not present

## 2017-11-08 DIAGNOSIS — I130 Hypertensive heart and chronic kidney disease with heart failure and stage 1 through stage 4 chronic kidney disease, or unspecified chronic kidney disease: Secondary | ICD-10-CM | POA: Diagnosis not present

## 2017-11-08 DIAGNOSIS — Z4781 Encounter for orthopedic aftercare following surgical amputation: Secondary | ICD-10-CM | POA: Diagnosis not present

## 2017-11-08 DIAGNOSIS — E1151 Type 2 diabetes mellitus with diabetic peripheral angiopathy without gangrene: Secondary | ICD-10-CM | POA: Diagnosis not present

## 2017-11-08 DIAGNOSIS — I5043 Acute on chronic combined systolic (congestive) and diastolic (congestive) heart failure: Secondary | ICD-10-CM | POA: Diagnosis not present

## 2017-11-08 DIAGNOSIS — E1122 Type 2 diabetes mellitus with diabetic chronic kidney disease: Secondary | ICD-10-CM | POA: Diagnosis not present

## 2017-11-10 DIAGNOSIS — E1151 Type 2 diabetes mellitus with diabetic peripheral angiopathy without gangrene: Secondary | ICD-10-CM | POA: Diagnosis not present

## 2017-11-10 DIAGNOSIS — Z4781 Encounter for orthopedic aftercare following surgical amputation: Secondary | ICD-10-CM | POA: Diagnosis not present

## 2017-11-10 DIAGNOSIS — I5043 Acute on chronic combined systolic (congestive) and diastolic (congestive) heart failure: Secondary | ICD-10-CM | POA: Diagnosis not present

## 2017-11-10 DIAGNOSIS — I130 Hypertensive heart and chronic kidney disease with heart failure and stage 1 through stage 4 chronic kidney disease, or unspecified chronic kidney disease: Secondary | ICD-10-CM | POA: Diagnosis not present

## 2017-11-10 DIAGNOSIS — E1122 Type 2 diabetes mellitus with diabetic chronic kidney disease: Secondary | ICD-10-CM | POA: Diagnosis not present

## 2017-11-10 DIAGNOSIS — E1142 Type 2 diabetes mellitus with diabetic polyneuropathy: Secondary | ICD-10-CM | POA: Diagnosis not present

## 2017-11-11 DIAGNOSIS — E1122 Type 2 diabetes mellitus with diabetic chronic kidney disease: Secondary | ICD-10-CM | POA: Diagnosis not present

## 2017-11-11 DIAGNOSIS — E1142 Type 2 diabetes mellitus with diabetic polyneuropathy: Secondary | ICD-10-CM | POA: Diagnosis not present

## 2017-11-11 DIAGNOSIS — E1151 Type 2 diabetes mellitus with diabetic peripheral angiopathy without gangrene: Secondary | ICD-10-CM | POA: Diagnosis not present

## 2017-11-11 DIAGNOSIS — Z4781 Encounter for orthopedic aftercare following surgical amputation: Secondary | ICD-10-CM | POA: Diagnosis not present

## 2017-11-11 DIAGNOSIS — I5043 Acute on chronic combined systolic (congestive) and diastolic (congestive) heart failure: Secondary | ICD-10-CM | POA: Diagnosis not present

## 2017-11-11 DIAGNOSIS — I130 Hypertensive heart and chronic kidney disease with heart failure and stage 1 through stage 4 chronic kidney disease, or unspecified chronic kidney disease: Secondary | ICD-10-CM | POA: Diagnosis not present

## 2017-11-14 DIAGNOSIS — E161 Other hypoglycemia: Secondary | ICD-10-CM | POA: Diagnosis not present

## 2017-11-14 DIAGNOSIS — E162 Hypoglycemia, unspecified: Secondary | ICD-10-CM | POA: Diagnosis not present

## 2017-11-15 DIAGNOSIS — I5022 Chronic systolic (congestive) heart failure: Secondary | ICD-10-CM | POA: Diagnosis not present

## 2017-11-15 DIAGNOSIS — I2510 Atherosclerotic heart disease of native coronary artery without angina pectoris: Secondary | ICD-10-CM | POA: Diagnosis present

## 2017-11-15 DIAGNOSIS — A419 Sepsis, unspecified organism: Secondary | ICD-10-CM | POA: Diagnosis not present

## 2017-11-15 DIAGNOSIS — I130 Hypertensive heart and chronic kidney disease with heart failure and stage 1 through stage 4 chronic kidney disease, or unspecified chronic kidney disease: Secondary | ICD-10-CM | POA: Diagnosis present

## 2017-11-15 DIAGNOSIS — I509 Heart failure, unspecified: Secondary | ICD-10-CM | POA: Diagnosis not present

## 2017-11-15 DIAGNOSIS — Z7901 Long term (current) use of anticoagulants: Secondary | ICD-10-CM | POA: Diagnosis not present

## 2017-11-15 DIAGNOSIS — Z7984 Long term (current) use of oral hypoglycemic drugs: Secondary | ICD-10-CM | POA: Diagnosis not present

## 2017-11-15 DIAGNOSIS — N390 Urinary tract infection, site not specified: Secondary | ICD-10-CM | POA: Diagnosis present

## 2017-11-15 DIAGNOSIS — Z79899 Other long term (current) drug therapy: Secondary | ICD-10-CM | POA: Diagnosis not present

## 2017-11-15 DIAGNOSIS — E7800 Pure hypercholesterolemia, unspecified: Secondary | ICD-10-CM | POA: Diagnosis not present

## 2017-11-15 DIAGNOSIS — N183 Chronic kidney disease, stage 3 (moderate): Secondary | ICD-10-CM | POA: Diagnosis not present

## 2017-11-15 DIAGNOSIS — N179 Acute kidney failure, unspecified: Secondary | ICD-10-CM | POA: Diagnosis not present

## 2017-11-15 DIAGNOSIS — E11649 Type 2 diabetes mellitus with hypoglycemia without coma: Secondary | ICD-10-CM | POA: Diagnosis present

## 2017-11-15 DIAGNOSIS — Z955 Presence of coronary angioplasty implant and graft: Secondary | ICD-10-CM | POA: Diagnosis not present

## 2017-11-15 DIAGNOSIS — I480 Paroxysmal atrial fibrillation: Secondary | ICD-10-CM | POA: Diagnosis not present

## 2017-11-15 DIAGNOSIS — E161 Other hypoglycemia: Secondary | ICD-10-CM | POA: Diagnosis not present

## 2017-11-15 DIAGNOSIS — E872 Acidosis: Secondary | ICD-10-CM | POA: Diagnosis present

## 2017-11-15 DIAGNOSIS — Z87891 Personal history of nicotine dependence: Secondary | ICD-10-CM | POA: Diagnosis not present

## 2017-11-15 DIAGNOSIS — E785 Hyperlipidemia, unspecified: Secondary | ICD-10-CM | POA: Diagnosis present

## 2017-11-15 DIAGNOSIS — E162 Hypoglycemia, unspecified: Secondary | ICD-10-CM | POA: Diagnosis not present

## 2017-11-15 DIAGNOSIS — E1122 Type 2 diabetes mellitus with diabetic chronic kidney disease: Secondary | ICD-10-CM | POA: Diagnosis present

## 2017-11-16 DIAGNOSIS — E162 Hypoglycemia, unspecified: Secondary | ICD-10-CM | POA: Diagnosis not present

## 2017-11-16 DIAGNOSIS — E11649 Type 2 diabetes mellitus with hypoglycemia without coma: Secondary | ICD-10-CM | POA: Diagnosis not present

## 2017-11-16 DIAGNOSIS — E872 Acidosis: Secondary | ICD-10-CM | POA: Diagnosis not present

## 2017-11-16 DIAGNOSIS — I509 Heart failure, unspecified: Secondary | ICD-10-CM | POA: Diagnosis not present

## 2017-11-16 DIAGNOSIS — E1122 Type 2 diabetes mellitus with diabetic chronic kidney disease: Secondary | ICD-10-CM | POA: Diagnosis not present

## 2017-11-16 DIAGNOSIS — I5022 Chronic systolic (congestive) heart failure: Secondary | ICD-10-CM | POA: Diagnosis not present

## 2017-11-16 DIAGNOSIS — N183 Chronic kidney disease, stage 3 (moderate): Secondary | ICD-10-CM | POA: Diagnosis not present

## 2017-11-16 DIAGNOSIS — I130 Hypertensive heart and chronic kidney disease with heart failure and stage 1 through stage 4 chronic kidney disease, or unspecified chronic kidney disease: Secondary | ICD-10-CM | POA: Diagnosis not present

## 2017-11-16 DIAGNOSIS — N390 Urinary tract infection, site not specified: Secondary | ICD-10-CM | POA: Diagnosis not present

## 2017-11-17 DIAGNOSIS — I130 Hypertensive heart and chronic kidney disease with heart failure and stage 1 through stage 4 chronic kidney disease, or unspecified chronic kidney disease: Secondary | ICD-10-CM | POA: Diagnosis not present

## 2017-11-17 DIAGNOSIS — E1122 Type 2 diabetes mellitus with diabetic chronic kidney disease: Secondary | ICD-10-CM | POA: Diagnosis not present

## 2017-11-17 DIAGNOSIS — Z4781 Encounter for orthopedic aftercare following surgical amputation: Secondary | ICD-10-CM | POA: Diagnosis not present

## 2017-11-17 DIAGNOSIS — E1142 Type 2 diabetes mellitus with diabetic polyneuropathy: Secondary | ICD-10-CM | POA: Diagnosis not present

## 2017-11-17 DIAGNOSIS — I5043 Acute on chronic combined systolic (congestive) and diastolic (congestive) heart failure: Secondary | ICD-10-CM | POA: Diagnosis not present

## 2017-11-17 DIAGNOSIS — E1151 Type 2 diabetes mellitus with diabetic peripheral angiopathy without gangrene: Secondary | ICD-10-CM | POA: Diagnosis not present

## 2017-11-18 DIAGNOSIS — E1151 Type 2 diabetes mellitus with diabetic peripheral angiopathy without gangrene: Secondary | ICD-10-CM | POA: Diagnosis not present

## 2017-11-18 DIAGNOSIS — I130 Hypertensive heart and chronic kidney disease with heart failure and stage 1 through stage 4 chronic kidney disease, or unspecified chronic kidney disease: Secondary | ICD-10-CM | POA: Diagnosis not present

## 2017-11-18 DIAGNOSIS — E1142 Type 2 diabetes mellitus with diabetic polyneuropathy: Secondary | ICD-10-CM | POA: Diagnosis not present

## 2017-11-18 DIAGNOSIS — I5043 Acute on chronic combined systolic (congestive) and diastolic (congestive) heart failure: Secondary | ICD-10-CM | POA: Diagnosis not present

## 2017-11-18 DIAGNOSIS — Z4781 Encounter for orthopedic aftercare following surgical amputation: Secondary | ICD-10-CM | POA: Diagnosis not present

## 2017-11-18 DIAGNOSIS — E1122 Type 2 diabetes mellitus with diabetic chronic kidney disease: Secondary | ICD-10-CM | POA: Diagnosis not present

## 2017-11-19 DIAGNOSIS — I130 Hypertensive heart and chronic kidney disease with heart failure and stage 1 through stage 4 chronic kidney disease, or unspecified chronic kidney disease: Secondary | ICD-10-CM | POA: Diagnosis not present

## 2017-11-19 DIAGNOSIS — I5043 Acute on chronic combined systolic (congestive) and diastolic (congestive) heart failure: Secondary | ICD-10-CM | POA: Diagnosis not present

## 2017-11-19 DIAGNOSIS — E1142 Type 2 diabetes mellitus with diabetic polyneuropathy: Secondary | ICD-10-CM | POA: Diagnosis not present

## 2017-11-19 DIAGNOSIS — Z4781 Encounter for orthopedic aftercare following surgical amputation: Secondary | ICD-10-CM | POA: Diagnosis not present

## 2017-11-19 DIAGNOSIS — E1122 Type 2 diabetes mellitus with diabetic chronic kidney disease: Secondary | ICD-10-CM | POA: Diagnosis not present

## 2017-11-19 DIAGNOSIS — E1151 Type 2 diabetes mellitus with diabetic peripheral angiopathy without gangrene: Secondary | ICD-10-CM | POA: Diagnosis not present

## 2017-11-22 DIAGNOSIS — I5043 Acute on chronic combined systolic (congestive) and diastolic (congestive) heart failure: Secondary | ICD-10-CM | POA: Diagnosis not present

## 2017-11-22 DIAGNOSIS — E1151 Type 2 diabetes mellitus with diabetic peripheral angiopathy without gangrene: Secondary | ICD-10-CM | POA: Diagnosis not present

## 2017-11-22 DIAGNOSIS — E1142 Type 2 diabetes mellitus with diabetic polyneuropathy: Secondary | ICD-10-CM | POA: Diagnosis not present

## 2017-11-22 DIAGNOSIS — Z4781 Encounter for orthopedic aftercare following surgical amputation: Secondary | ICD-10-CM | POA: Diagnosis not present

## 2017-11-22 DIAGNOSIS — I130 Hypertensive heart and chronic kidney disease with heart failure and stage 1 through stage 4 chronic kidney disease, or unspecified chronic kidney disease: Secondary | ICD-10-CM | POA: Diagnosis not present

## 2017-11-22 DIAGNOSIS — E1122 Type 2 diabetes mellitus with diabetic chronic kidney disease: Secondary | ICD-10-CM | POA: Diagnosis not present

## 2017-11-23 DIAGNOSIS — E1142 Type 2 diabetes mellitus with diabetic polyneuropathy: Secondary | ICD-10-CM | POA: Diagnosis not present

## 2017-11-23 DIAGNOSIS — E119 Type 2 diabetes mellitus without complications: Secondary | ICD-10-CM | POA: Diagnosis not present

## 2017-11-23 DIAGNOSIS — E1122 Type 2 diabetes mellitus with diabetic chronic kidney disease: Secondary | ICD-10-CM | POA: Diagnosis not present

## 2017-11-23 DIAGNOSIS — E1151 Type 2 diabetes mellitus with diabetic peripheral angiopathy without gangrene: Secondary | ICD-10-CM | POA: Diagnosis not present

## 2017-11-23 DIAGNOSIS — Z7901 Long term (current) use of anticoagulants: Secondary | ICD-10-CM | POA: Diagnosis not present

## 2017-11-23 DIAGNOSIS — I509 Heart failure, unspecified: Secondary | ICD-10-CM | POA: Diagnosis not present

## 2017-11-23 DIAGNOSIS — I130 Hypertensive heart and chronic kidney disease with heart failure and stage 1 through stage 4 chronic kidney disease, or unspecified chronic kidney disease: Secondary | ICD-10-CM | POA: Diagnosis not present

## 2017-11-23 DIAGNOSIS — N184 Chronic kidney disease, stage 4 (severe): Secondary | ICD-10-CM | POA: Diagnosis not present

## 2017-11-23 DIAGNOSIS — Z4781 Encounter for orthopedic aftercare following surgical amputation: Secondary | ICD-10-CM | POA: Diagnosis not present

## 2017-11-23 DIAGNOSIS — I5043 Acute on chronic combined systolic (congestive) and diastolic (congestive) heart failure: Secondary | ICD-10-CM | POA: Diagnosis not present

## 2017-11-24 DIAGNOSIS — Z4781 Encounter for orthopedic aftercare following surgical amputation: Secondary | ICD-10-CM | POA: Diagnosis not present

## 2017-11-24 DIAGNOSIS — E1122 Type 2 diabetes mellitus with diabetic chronic kidney disease: Secondary | ICD-10-CM | POA: Diagnosis not present

## 2017-11-24 DIAGNOSIS — E1142 Type 2 diabetes mellitus with diabetic polyneuropathy: Secondary | ICD-10-CM | POA: Diagnosis not present

## 2017-11-24 DIAGNOSIS — I5043 Acute on chronic combined systolic (congestive) and diastolic (congestive) heart failure: Secondary | ICD-10-CM | POA: Diagnosis not present

## 2017-11-24 DIAGNOSIS — E1151 Type 2 diabetes mellitus with diabetic peripheral angiopathy without gangrene: Secondary | ICD-10-CM | POA: Diagnosis not present

## 2017-11-24 DIAGNOSIS — I130 Hypertensive heart and chronic kidney disease with heart failure and stage 1 through stage 4 chronic kidney disease, or unspecified chronic kidney disease: Secondary | ICD-10-CM | POA: Diagnosis not present

## 2017-11-25 DIAGNOSIS — E1142 Type 2 diabetes mellitus with diabetic polyneuropathy: Secondary | ICD-10-CM | POA: Diagnosis not present

## 2017-11-25 DIAGNOSIS — E1151 Type 2 diabetes mellitus with diabetic peripheral angiopathy without gangrene: Secondary | ICD-10-CM | POA: Diagnosis not present

## 2017-11-25 DIAGNOSIS — E1122 Type 2 diabetes mellitus with diabetic chronic kidney disease: Secondary | ICD-10-CM | POA: Diagnosis not present

## 2017-11-25 DIAGNOSIS — Z4781 Encounter for orthopedic aftercare following surgical amputation: Secondary | ICD-10-CM | POA: Diagnosis not present

## 2017-11-25 DIAGNOSIS — I130 Hypertensive heart and chronic kidney disease with heart failure and stage 1 through stage 4 chronic kidney disease, or unspecified chronic kidney disease: Secondary | ICD-10-CM | POA: Diagnosis not present

## 2017-11-25 DIAGNOSIS — I5043 Acute on chronic combined systolic (congestive) and diastolic (congestive) heart failure: Secondary | ICD-10-CM | POA: Diagnosis not present

## 2017-11-29 DIAGNOSIS — I5043 Acute on chronic combined systolic (congestive) and diastolic (congestive) heart failure: Secondary | ICD-10-CM | POA: Diagnosis not present

## 2017-11-29 DIAGNOSIS — I130 Hypertensive heart and chronic kidney disease with heart failure and stage 1 through stage 4 chronic kidney disease, or unspecified chronic kidney disease: Secondary | ICD-10-CM | POA: Diagnosis not present

## 2017-11-29 DIAGNOSIS — E1122 Type 2 diabetes mellitus with diabetic chronic kidney disease: Secondary | ICD-10-CM | POA: Diagnosis not present

## 2017-11-29 DIAGNOSIS — E1142 Type 2 diabetes mellitus with diabetic polyneuropathy: Secondary | ICD-10-CM | POA: Diagnosis not present

## 2017-11-29 DIAGNOSIS — Z4781 Encounter for orthopedic aftercare following surgical amputation: Secondary | ICD-10-CM | POA: Diagnosis not present

## 2017-11-29 DIAGNOSIS — E1151 Type 2 diabetes mellitus with diabetic peripheral angiopathy without gangrene: Secondary | ICD-10-CM | POA: Diagnosis not present

## 2017-11-30 DIAGNOSIS — Z4781 Encounter for orthopedic aftercare following surgical amputation: Secondary | ICD-10-CM | POA: Diagnosis not present

## 2017-11-30 DIAGNOSIS — E1122 Type 2 diabetes mellitus with diabetic chronic kidney disease: Secondary | ICD-10-CM | POA: Diagnosis not present

## 2017-11-30 DIAGNOSIS — E1151 Type 2 diabetes mellitus with diabetic peripheral angiopathy without gangrene: Secondary | ICD-10-CM | POA: Diagnosis not present

## 2017-11-30 DIAGNOSIS — E1142 Type 2 diabetes mellitus with diabetic polyneuropathy: Secondary | ICD-10-CM | POA: Diagnosis not present

## 2017-11-30 DIAGNOSIS — I5043 Acute on chronic combined systolic (congestive) and diastolic (congestive) heart failure: Secondary | ICD-10-CM | POA: Diagnosis not present

## 2017-11-30 DIAGNOSIS — I482 Chronic atrial fibrillation: Secondary | ICD-10-CM | POA: Diagnosis not present

## 2017-11-30 DIAGNOSIS — I130 Hypertensive heart and chronic kidney disease with heart failure and stage 1 through stage 4 chronic kidney disease, or unspecified chronic kidney disease: Secondary | ICD-10-CM | POA: Diagnosis not present

## 2017-12-02 DIAGNOSIS — I5043 Acute on chronic combined systolic (congestive) and diastolic (congestive) heart failure: Secondary | ICD-10-CM | POA: Diagnosis not present

## 2017-12-02 DIAGNOSIS — Z4781 Encounter for orthopedic aftercare following surgical amputation: Secondary | ICD-10-CM | POA: Diagnosis not present

## 2017-12-02 DIAGNOSIS — E1122 Type 2 diabetes mellitus with diabetic chronic kidney disease: Secondary | ICD-10-CM | POA: Diagnosis not present

## 2017-12-02 DIAGNOSIS — E1142 Type 2 diabetes mellitus with diabetic polyneuropathy: Secondary | ICD-10-CM | POA: Diagnosis not present

## 2017-12-02 DIAGNOSIS — E1151 Type 2 diabetes mellitus with diabetic peripheral angiopathy without gangrene: Secondary | ICD-10-CM | POA: Diagnosis not present

## 2017-12-02 DIAGNOSIS — I130 Hypertensive heart and chronic kidney disease with heart failure and stage 1 through stage 4 chronic kidney disease, or unspecified chronic kidney disease: Secondary | ICD-10-CM | POA: Diagnosis not present

## 2017-12-06 DIAGNOSIS — E1122 Type 2 diabetes mellitus with diabetic chronic kidney disease: Secondary | ICD-10-CM | POA: Diagnosis not present

## 2017-12-06 DIAGNOSIS — I130 Hypertensive heart and chronic kidney disease with heart failure and stage 1 through stage 4 chronic kidney disease, or unspecified chronic kidney disease: Secondary | ICD-10-CM | POA: Diagnosis not present

## 2017-12-06 DIAGNOSIS — Z4781 Encounter for orthopedic aftercare following surgical amputation: Secondary | ICD-10-CM | POA: Diagnosis not present

## 2017-12-06 DIAGNOSIS — I5043 Acute on chronic combined systolic (congestive) and diastolic (congestive) heart failure: Secondary | ICD-10-CM | POA: Diagnosis not present

## 2017-12-06 DIAGNOSIS — E1142 Type 2 diabetes mellitus with diabetic polyneuropathy: Secondary | ICD-10-CM | POA: Diagnosis not present

## 2017-12-06 DIAGNOSIS — E1151 Type 2 diabetes mellitus with diabetic peripheral angiopathy without gangrene: Secondary | ICD-10-CM | POA: Diagnosis not present

## 2017-12-09 DIAGNOSIS — E1142 Type 2 diabetes mellitus with diabetic polyneuropathy: Secondary | ICD-10-CM | POA: Diagnosis not present

## 2017-12-09 DIAGNOSIS — E1122 Type 2 diabetes mellitus with diabetic chronic kidney disease: Secondary | ICD-10-CM | POA: Diagnosis not present

## 2017-12-09 DIAGNOSIS — I5043 Acute on chronic combined systolic (congestive) and diastolic (congestive) heart failure: Secondary | ICD-10-CM | POA: Diagnosis not present

## 2017-12-09 DIAGNOSIS — Z4781 Encounter for orthopedic aftercare following surgical amputation: Secondary | ICD-10-CM | POA: Diagnosis not present

## 2017-12-09 DIAGNOSIS — I130 Hypertensive heart and chronic kidney disease with heart failure and stage 1 through stage 4 chronic kidney disease, or unspecified chronic kidney disease: Secondary | ICD-10-CM | POA: Diagnosis not present

## 2017-12-09 DIAGNOSIS — E1151 Type 2 diabetes mellitus with diabetic peripheral angiopathy without gangrene: Secondary | ICD-10-CM | POA: Diagnosis not present

## 2017-12-13 DIAGNOSIS — I130 Hypertensive heart and chronic kidney disease with heart failure and stage 1 through stage 4 chronic kidney disease, or unspecified chronic kidney disease: Secondary | ICD-10-CM | POA: Diagnosis not present

## 2017-12-13 DIAGNOSIS — E1151 Type 2 diabetes mellitus with diabetic peripheral angiopathy without gangrene: Secondary | ICD-10-CM | POA: Diagnosis not present

## 2017-12-13 DIAGNOSIS — E1122 Type 2 diabetes mellitus with diabetic chronic kidney disease: Secondary | ICD-10-CM | POA: Diagnosis not present

## 2017-12-13 DIAGNOSIS — I5043 Acute on chronic combined systolic (congestive) and diastolic (congestive) heart failure: Secondary | ICD-10-CM | POA: Diagnosis not present

## 2017-12-13 DIAGNOSIS — Z4781 Encounter for orthopedic aftercare following surgical amputation: Secondary | ICD-10-CM | POA: Diagnosis not present

## 2017-12-13 DIAGNOSIS — E1142 Type 2 diabetes mellitus with diabetic polyneuropathy: Secondary | ICD-10-CM | POA: Diagnosis not present

## 2017-12-15 DIAGNOSIS — I5043 Acute on chronic combined systolic (congestive) and diastolic (congestive) heart failure: Secondary | ICD-10-CM | POA: Diagnosis not present

## 2017-12-15 DIAGNOSIS — E1122 Type 2 diabetes mellitus with diabetic chronic kidney disease: Secondary | ICD-10-CM | POA: Diagnosis not present

## 2017-12-15 DIAGNOSIS — E1151 Type 2 diabetes mellitus with diabetic peripheral angiopathy without gangrene: Secondary | ICD-10-CM | POA: Diagnosis not present

## 2017-12-15 DIAGNOSIS — I130 Hypertensive heart and chronic kidney disease with heart failure and stage 1 through stage 4 chronic kidney disease, or unspecified chronic kidney disease: Secondary | ICD-10-CM | POA: Diagnosis not present

## 2017-12-15 DIAGNOSIS — E1142 Type 2 diabetes mellitus with diabetic polyneuropathy: Secondary | ICD-10-CM | POA: Diagnosis not present

## 2017-12-15 DIAGNOSIS — Z4781 Encounter for orthopedic aftercare following surgical amputation: Secondary | ICD-10-CM | POA: Diagnosis not present

## 2017-12-16 DIAGNOSIS — Z4781 Encounter for orthopedic aftercare following surgical amputation: Secondary | ICD-10-CM | POA: Diagnosis not present

## 2017-12-16 DIAGNOSIS — E1142 Type 2 diabetes mellitus with diabetic polyneuropathy: Secondary | ICD-10-CM | POA: Diagnosis not present

## 2017-12-16 DIAGNOSIS — E1151 Type 2 diabetes mellitus with diabetic peripheral angiopathy without gangrene: Secondary | ICD-10-CM | POA: Diagnosis not present

## 2017-12-16 DIAGNOSIS — I5043 Acute on chronic combined systolic (congestive) and diastolic (congestive) heart failure: Secondary | ICD-10-CM | POA: Diagnosis not present

## 2017-12-16 DIAGNOSIS — I130 Hypertensive heart and chronic kidney disease with heart failure and stage 1 through stage 4 chronic kidney disease, or unspecified chronic kidney disease: Secondary | ICD-10-CM | POA: Diagnosis not present

## 2017-12-16 DIAGNOSIS — E1122 Type 2 diabetes mellitus with diabetic chronic kidney disease: Secondary | ICD-10-CM | POA: Diagnosis not present

## 2017-12-20 DIAGNOSIS — E1142 Type 2 diabetes mellitus with diabetic polyneuropathy: Secondary | ICD-10-CM | POA: Diagnosis not present

## 2017-12-20 DIAGNOSIS — Z4781 Encounter for orthopedic aftercare following surgical amputation: Secondary | ICD-10-CM | POA: Diagnosis not present

## 2017-12-20 DIAGNOSIS — E1151 Type 2 diabetes mellitus with diabetic peripheral angiopathy without gangrene: Secondary | ICD-10-CM | POA: Diagnosis not present

## 2017-12-20 DIAGNOSIS — I130 Hypertensive heart and chronic kidney disease with heart failure and stage 1 through stage 4 chronic kidney disease, or unspecified chronic kidney disease: Secondary | ICD-10-CM | POA: Diagnosis not present

## 2017-12-20 DIAGNOSIS — I5043 Acute on chronic combined systolic (congestive) and diastolic (congestive) heart failure: Secondary | ICD-10-CM | POA: Diagnosis not present

## 2017-12-20 DIAGNOSIS — E1122 Type 2 diabetes mellitus with diabetic chronic kidney disease: Secondary | ICD-10-CM | POA: Diagnosis not present

## 2017-12-23 DIAGNOSIS — E1122 Type 2 diabetes mellitus with diabetic chronic kidney disease: Secondary | ICD-10-CM | POA: Diagnosis not present

## 2017-12-23 DIAGNOSIS — E1151 Type 2 diabetes mellitus with diabetic peripheral angiopathy without gangrene: Secondary | ICD-10-CM | POA: Diagnosis not present

## 2017-12-23 DIAGNOSIS — I130 Hypertensive heart and chronic kidney disease with heart failure and stage 1 through stage 4 chronic kidney disease, or unspecified chronic kidney disease: Secondary | ICD-10-CM | POA: Diagnosis not present

## 2017-12-23 DIAGNOSIS — Z4781 Encounter for orthopedic aftercare following surgical amputation: Secondary | ICD-10-CM | POA: Diagnosis not present

## 2017-12-23 DIAGNOSIS — E1142 Type 2 diabetes mellitus with diabetic polyneuropathy: Secondary | ICD-10-CM | POA: Diagnosis not present

## 2017-12-23 DIAGNOSIS — I5043 Acute on chronic combined systolic (congestive) and diastolic (congestive) heart failure: Secondary | ICD-10-CM | POA: Diagnosis not present

## 2017-12-27 DIAGNOSIS — I130 Hypertensive heart and chronic kidney disease with heart failure and stage 1 through stage 4 chronic kidney disease, or unspecified chronic kidney disease: Secondary | ICD-10-CM | POA: Diagnosis not present

## 2017-12-27 DIAGNOSIS — E1142 Type 2 diabetes mellitus with diabetic polyneuropathy: Secondary | ICD-10-CM | POA: Diagnosis not present

## 2017-12-27 DIAGNOSIS — E1122 Type 2 diabetes mellitus with diabetic chronic kidney disease: Secondary | ICD-10-CM | POA: Diagnosis not present

## 2017-12-27 DIAGNOSIS — E1151 Type 2 diabetes mellitus with diabetic peripheral angiopathy without gangrene: Secondary | ICD-10-CM | POA: Diagnosis not present

## 2017-12-27 DIAGNOSIS — I5043 Acute on chronic combined systolic (congestive) and diastolic (congestive) heart failure: Secondary | ICD-10-CM | POA: Diagnosis not present

## 2017-12-27 DIAGNOSIS — R791 Abnormal coagulation profile: Secondary | ICD-10-CM | POA: Diagnosis not present

## 2017-12-27 DIAGNOSIS — Z4781 Encounter for orthopedic aftercare following surgical amputation: Secondary | ICD-10-CM | POA: Diagnosis not present

## 2017-12-27 DIAGNOSIS — Z7901 Long term (current) use of anticoagulants: Secondary | ICD-10-CM | POA: Diagnosis not present

## 2017-12-30 DIAGNOSIS — E1122 Type 2 diabetes mellitus with diabetic chronic kidney disease: Secondary | ICD-10-CM | POA: Diagnosis not present

## 2017-12-30 DIAGNOSIS — I130 Hypertensive heart and chronic kidney disease with heart failure and stage 1 through stage 4 chronic kidney disease, or unspecified chronic kidney disease: Secondary | ICD-10-CM | POA: Diagnosis not present

## 2017-12-30 DIAGNOSIS — E1151 Type 2 diabetes mellitus with diabetic peripheral angiopathy without gangrene: Secondary | ICD-10-CM | POA: Diagnosis not present

## 2017-12-30 DIAGNOSIS — I5043 Acute on chronic combined systolic (congestive) and diastolic (congestive) heart failure: Secondary | ICD-10-CM | POA: Diagnosis not present

## 2017-12-30 DIAGNOSIS — E1142 Type 2 diabetes mellitus with diabetic polyneuropathy: Secondary | ICD-10-CM | POA: Diagnosis not present

## 2017-12-30 DIAGNOSIS — Z4781 Encounter for orthopedic aftercare following surgical amputation: Secondary | ICD-10-CM | POA: Diagnosis not present

## 2018-01-24 DIAGNOSIS — Z7901 Long term (current) use of anticoagulants: Secondary | ICD-10-CM | POA: Diagnosis not present

## 2018-01-27 DIAGNOSIS — Z7901 Long term (current) use of anticoagulants: Secondary | ICD-10-CM | POA: Diagnosis not present

## 2018-02-02 DIAGNOSIS — Z7901 Long term (current) use of anticoagulants: Secondary | ICD-10-CM | POA: Diagnosis not present

## 2018-02-24 DIAGNOSIS — Z7901 Long term (current) use of anticoagulants: Secondary | ICD-10-CM | POA: Diagnosis not present

## 2018-03-02 DIAGNOSIS — Z7901 Long term (current) use of anticoagulants: Secondary | ICD-10-CM | POA: Diagnosis not present

## 2018-03-29 IMAGING — CR DG CHEST 1V PORT
1 series · 1 of 1 positions shown · non-contrast
Comparison: Yesterday.

CLINICAL DATA: Respiratory failure.  Intubated.

EXAM:
PORTABLE CHEST 1 VIEW

[AP]
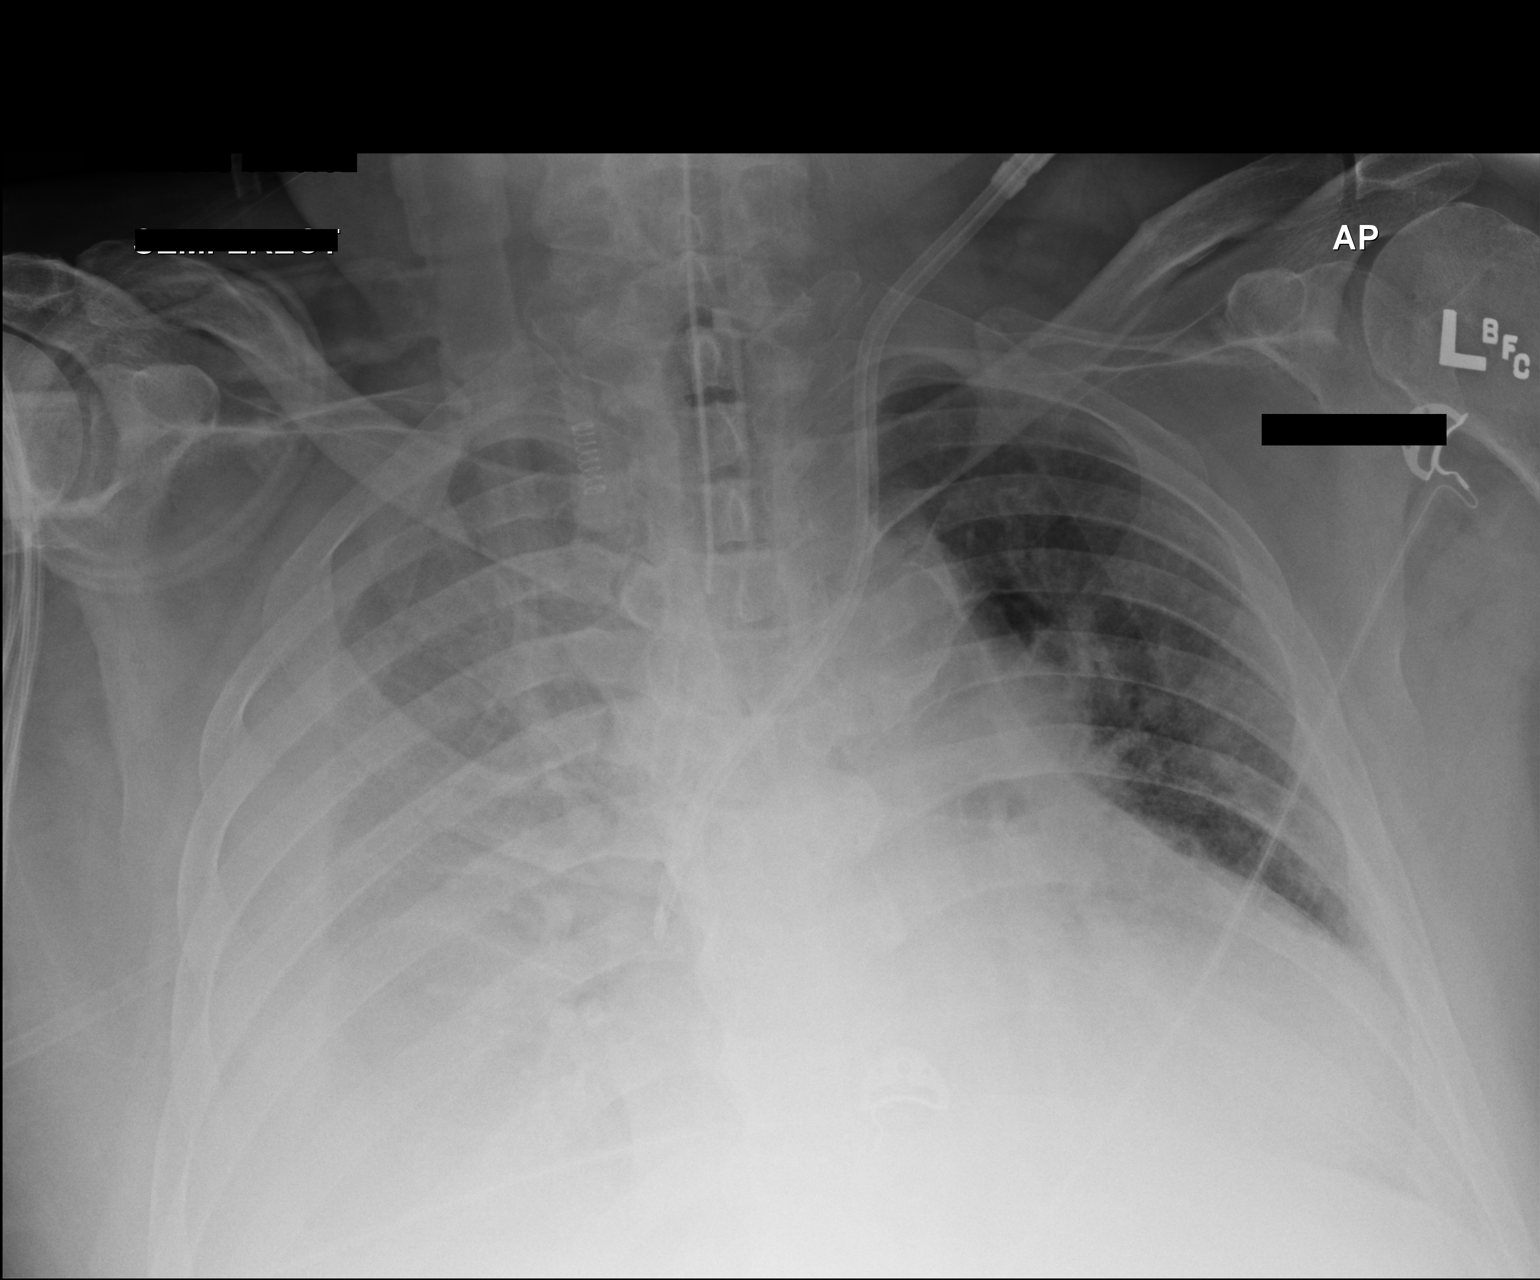

[1 of 1 positions shown; findings below may reference images not displayed]

FINDINGS: Endotracheal tube in satisfactory position. Left jugular catheter
tip in the superior vena cava. Stable enlarged cardiac silhouette.
Increased right pleural fluid. The interstitial markings remain
mildly prominent. Aortic calcification. Thoracic spine degenerative
changes
IMPRESSION: 1. Increased right pleural fluid.
2. Stable cardiomegaly and mild interstitial lung disease.
3. Aortic atherosclerosis.

## 2018-03-29 IMAGING — CR DG ABD PORTABLE 1V
1 series · 1 of 1 positions shown · non-contrast
Comparison: chest x-ray obtained earlier today

CLINICAL DATA: 70-year-old male status post nasogastric tube
placement

EXAM:
PORTABLE ABDOMEN - 1 VIEW

[AP]
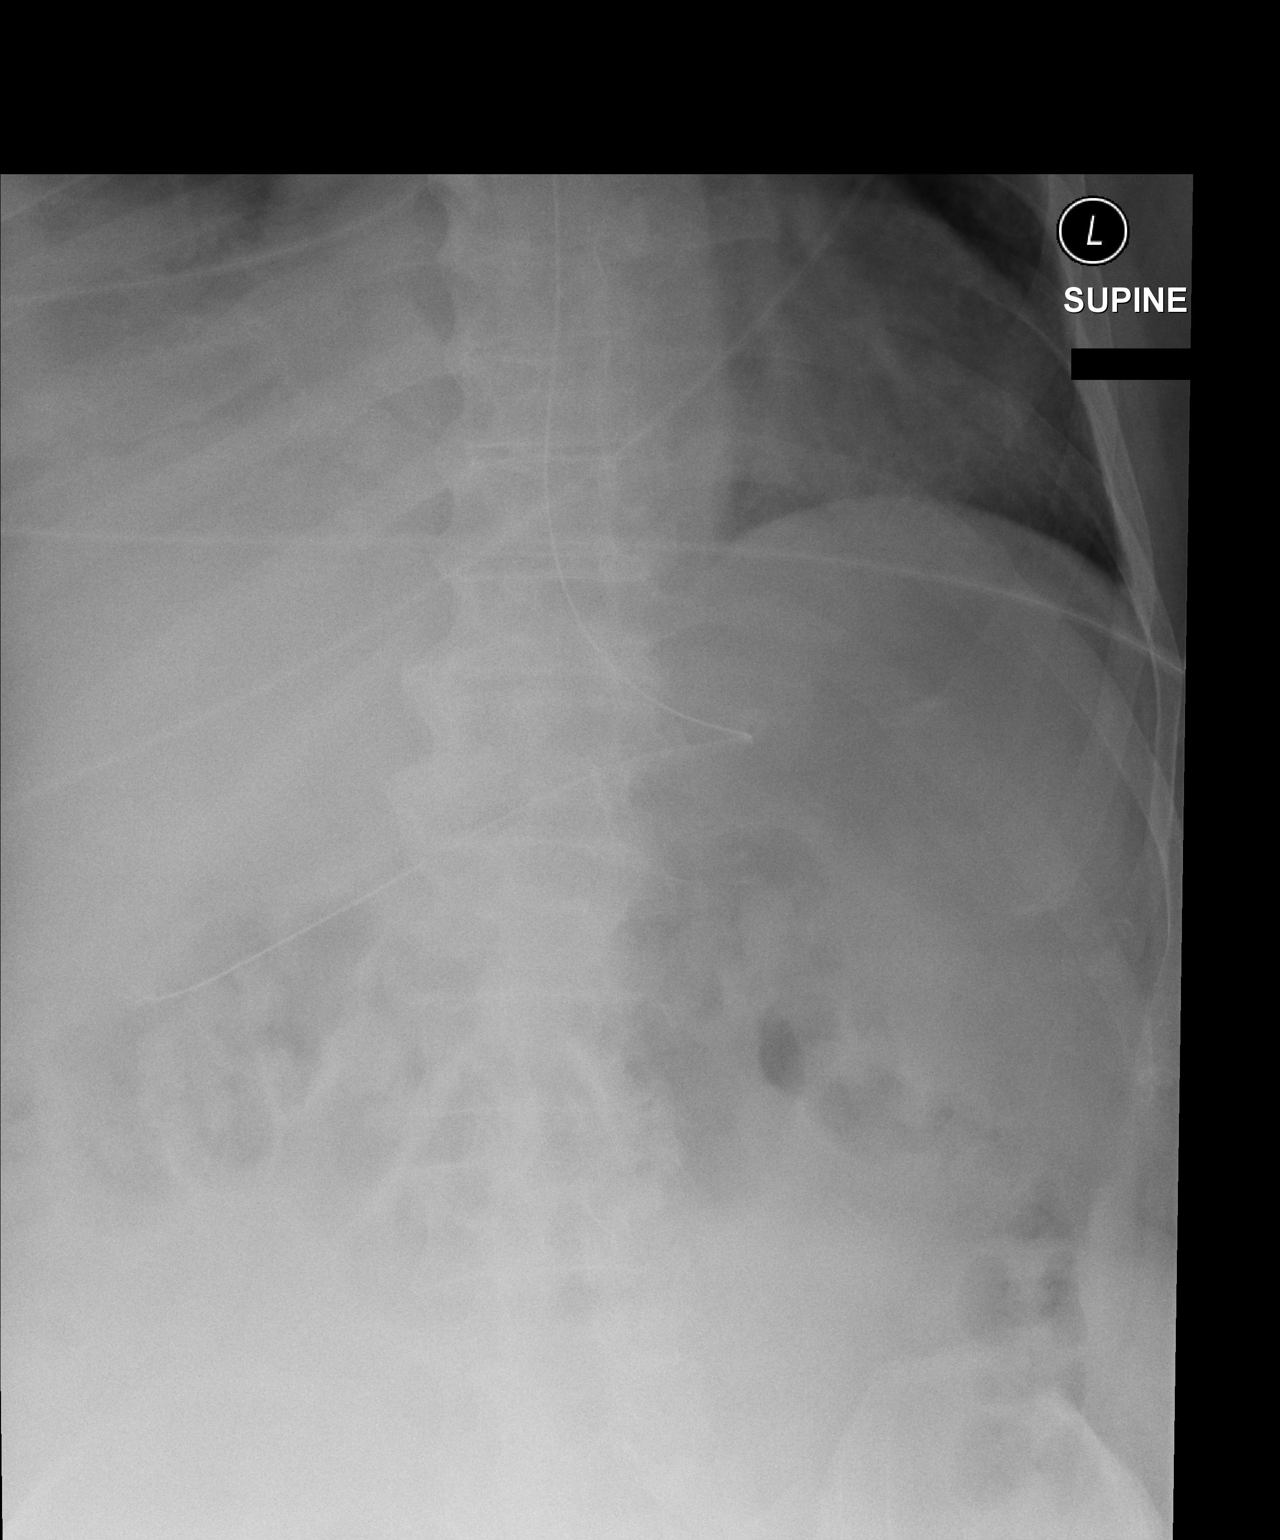

[1 of 1 positions shown; findings below may reference images not displayed]

FINDINGS: A nasogastric tube is present. The tip projects over the region of
the gastric antrum. The bowel gas pattern is unremarkable.
Incompletely imaged airspace disease in the right lung with probable
large effusion. No acute osseous abnormality. Mild multilevel
degenerative changes throughout the visualized spine.
IMPRESSION: 1. The tip of the nasogastric tube projects over the gastric antrum.
2. Incompletely imaged right pleural effusion and associated
airspace opacity.

## 2018-03-30 IMAGING — CR DG CHEST 1V PORT
1 series · 1 of 1 positions shown · non-contrast
Comparison: December 27, 2016

CLINICAL DATA: Hypoxia

EXAM:
PORTABLE CHEST 1 VIEW

[AP]
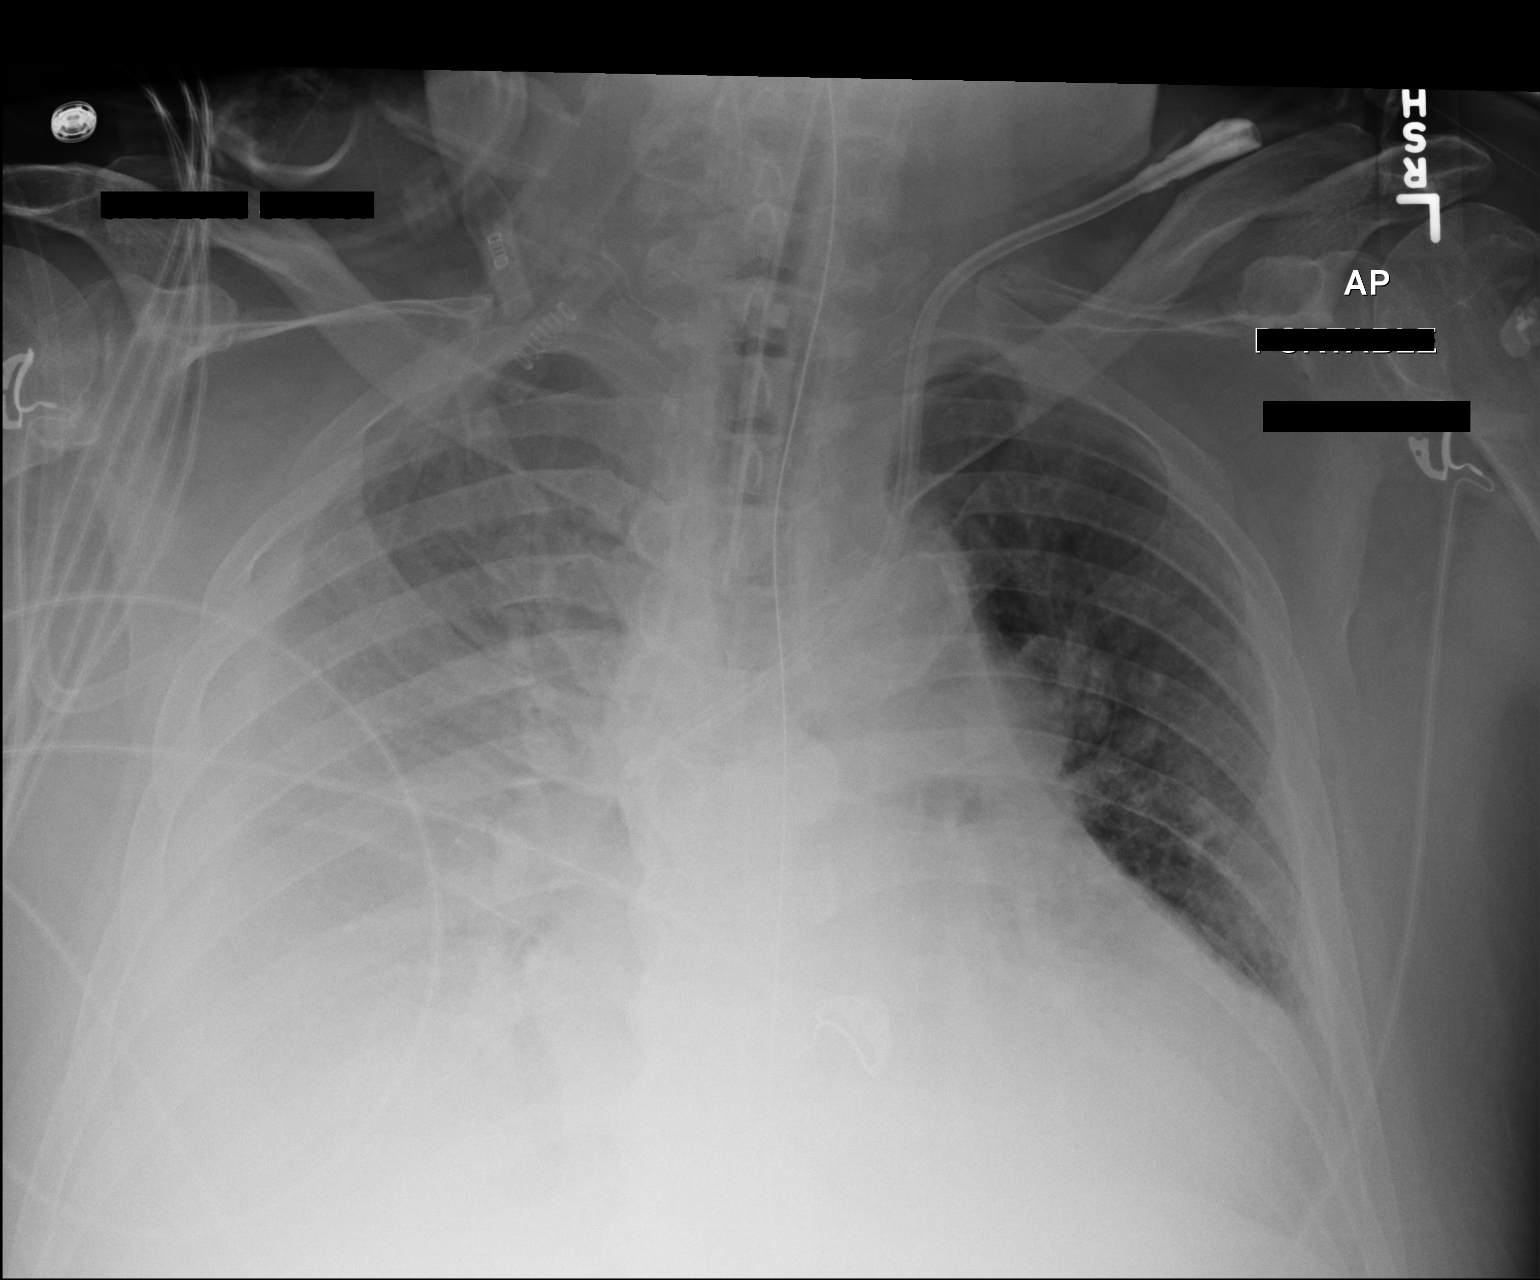

[1 of 1 positions shown; findings below may reference images not displayed]

FINDINGS: Endotracheal tube tip is 3.4 cm above the carina. Nasogastric tube
tip and side port below the diaphragm. Central catheter tip is at
the junction of the left innominate vein and superior vena cava. No
pneumothorax. There is persistent pleural effusion on the right with
consolidation in the right base region. There is atelectatic change
in the left base. Left lung is otherwise clear. There is
cardiomegaly with pulmonary venous hypertension. There is
atherosclerotic calcification aorta. No evident adenopathy. No bone
lesions.
IMPRESSION: Findings suspicious for a degree of underlying congestive heart
failure. Sizable pleural effusion on the right with consolidation in
the right base. There may be superimposed pneumonia in the right
base. There is mild atelectasis in the left base. There is aortic
atherosclerosis. Tube and catheter positions as described without
evident pneumothorax.

## 2018-03-31 IMAGING — CR DG CHEST 1V PORT
1 series · 1 of 1 positions shown · non-contrast
Comparison: 12/28/2016.

CLINICAL DATA: 70-year-old male with respiratory failure.
Subsequent encounter.

EXAM:
PORTABLE CHEST 1 VIEW

[AP]
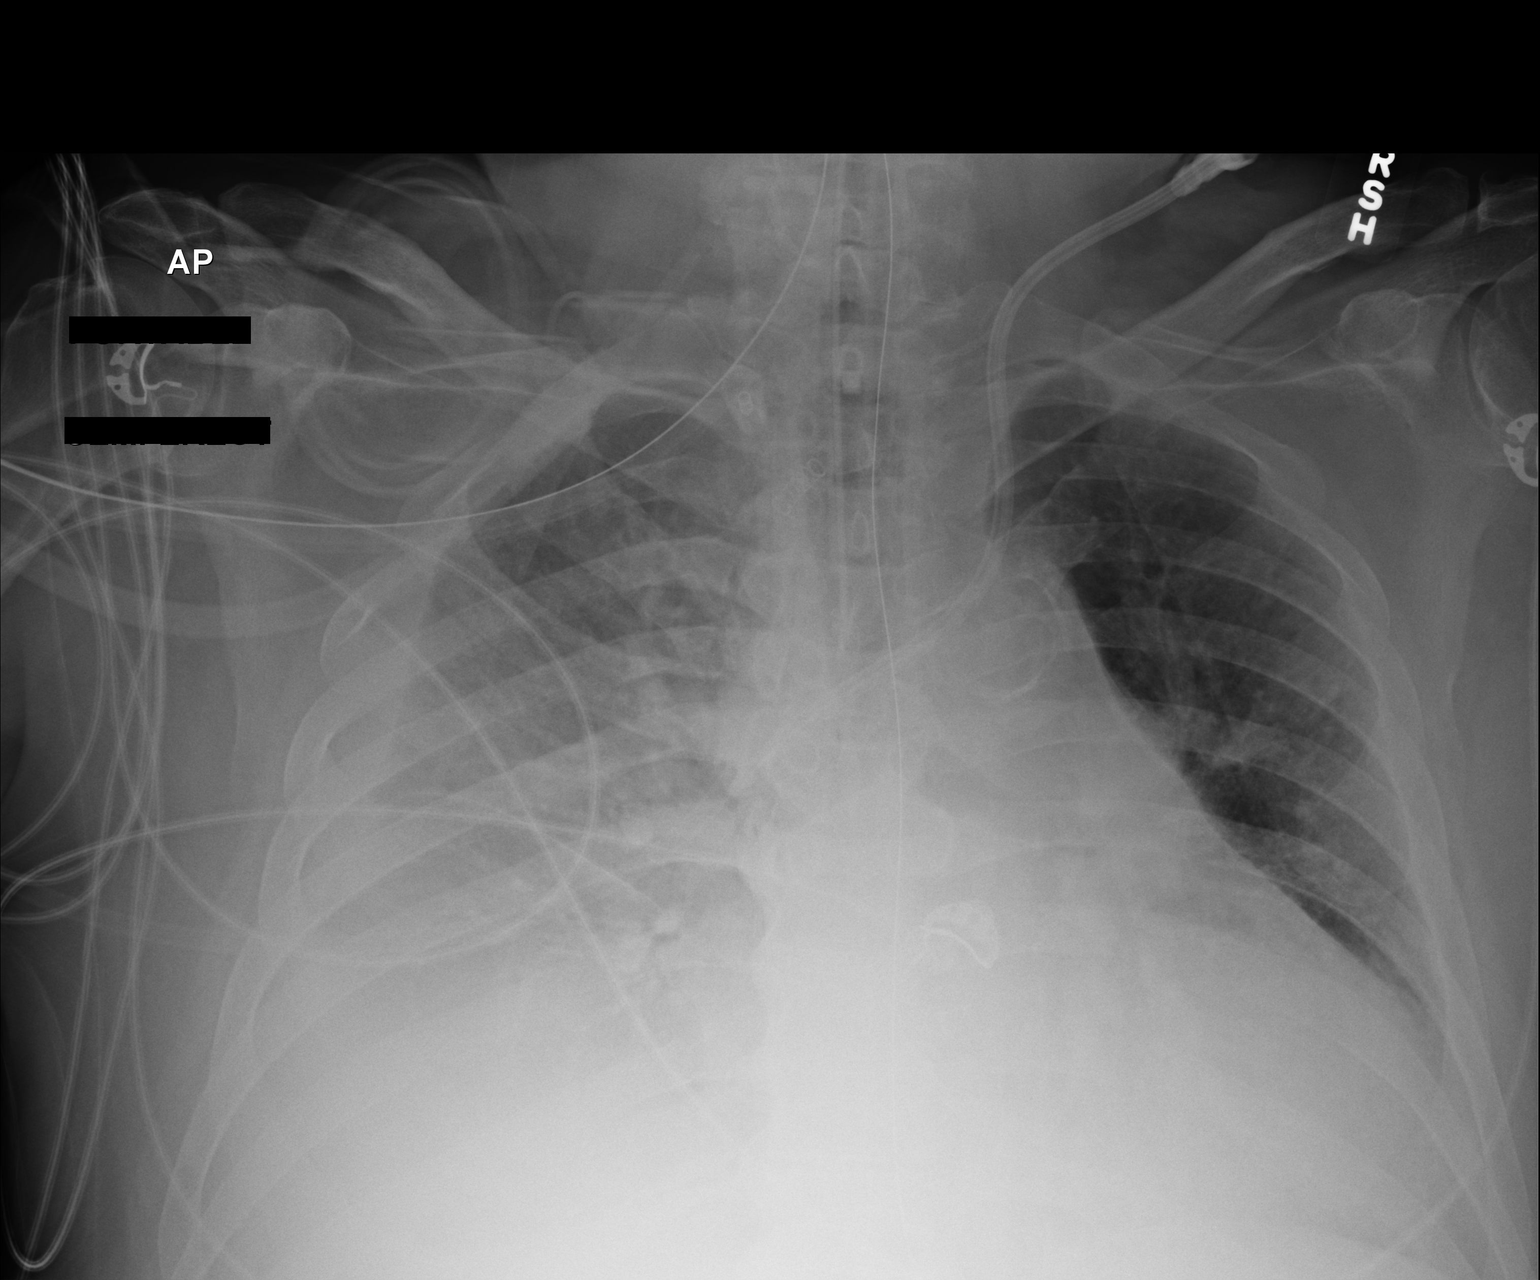

[1 of 1 positions shown; findings below may reference images not displayed]

FINDINGS: Endotracheal tube tip 2.9 cm above the carina.

Left central line tip projects at the level of the proximal superior
vena cava directed slightly laterally possibly impressing upon
lateral wall of the superior vena cava without change.

Nasogastric tube courses below the diaphragm. Tip is not included on
the present exam.

Asymmetric airspace disease greater on right with right-sided
pleural effusion. This may represent result of pulmonary edema.
Difficult to exclude infectious infiltrate within the right lung in
the proper clinical setting.

Heart is difficult to adequately assess.

Calcified aorta.
IMPRESSION: Overall no significant change in asymmetric airspace disease greater
on right with right-sided pleural effusion suggestive of pulmonary
edema.

Left central line tip projects at the level of the proximal superior
vena cava directed slightly laterally possibly impressing upon
lateral wall of the superior vena cava without change.

## 2018-04-05 ENCOUNTER — Inpatient Hospital Stay (HOSPITAL_COMMUNITY)
Admission: EM | Admit: 2018-04-05 | Discharge: 2018-05-12 | DRG: 871 | Disposition: E | Payer: Medicare Other | Attending: Internal Medicine | Admitting: Internal Medicine

## 2018-04-05 ENCOUNTER — Other Ambulatory Visit: Payer: Self-pay

## 2018-04-05 ENCOUNTER — Emergency Department (HOSPITAL_COMMUNITY): Payer: Medicare Other

## 2018-04-05 ENCOUNTER — Encounter (HOSPITAL_COMMUNITY): Payer: Self-pay | Admitting: Emergency Medicine

## 2018-04-05 DIAGNOSIS — L97529 Non-pressure chronic ulcer of other part of left foot with unspecified severity: Secondary | ICD-10-CM | POA: Diagnosis present

## 2018-04-05 DIAGNOSIS — I5022 Chronic systolic (congestive) heart failure: Secondary | ICD-10-CM | POA: Diagnosis not present

## 2018-04-05 DIAGNOSIS — R21 Rash and other nonspecific skin eruption: Secondary | ICD-10-CM | POA: Diagnosis present

## 2018-04-05 DIAGNOSIS — N170 Acute kidney failure with tubular necrosis: Secondary | ICD-10-CM | POA: Diagnosis present

## 2018-04-05 DIAGNOSIS — I2510 Atherosclerotic heart disease of native coronary artery without angina pectoris: Secondary | ICD-10-CM | POA: Diagnosis present

## 2018-04-05 DIAGNOSIS — F419 Anxiety disorder, unspecified: Secondary | ICD-10-CM

## 2018-04-05 DIAGNOSIS — Z66 Do not resuscitate: Secondary | ICD-10-CM | POA: Diagnosis not present

## 2018-04-05 DIAGNOSIS — Z515 Encounter for palliative care: Secondary | ICD-10-CM | POA: Diagnosis not present

## 2018-04-05 DIAGNOSIS — L03116 Cellulitis of left lower limb: Secondary | ICD-10-CM | POA: Diagnosis present

## 2018-04-05 DIAGNOSIS — R0602 Shortness of breath: Secondary | ICD-10-CM

## 2018-04-05 DIAGNOSIS — Z888 Allergy status to other drugs, medicaments and biological substances status: Secondary | ICD-10-CM

## 2018-04-05 DIAGNOSIS — R946 Abnormal results of thyroid function studies: Secondary | ICD-10-CM | POA: Diagnosis present

## 2018-04-05 DIAGNOSIS — N179 Acute kidney failure, unspecified: Secondary | ICD-10-CM | POA: Diagnosis not present

## 2018-04-05 DIAGNOSIS — E875 Hyperkalemia: Secondary | ICD-10-CM | POA: Diagnosis present

## 2018-04-05 DIAGNOSIS — I959 Hypotension, unspecified: Secondary | ICD-10-CM | POA: Diagnosis not present

## 2018-04-05 DIAGNOSIS — E872 Acidosis: Secondary | ICD-10-CM | POA: Diagnosis present

## 2018-04-05 DIAGNOSIS — N390 Urinary tract infection, site not specified: Secondary | ICD-10-CM | POA: Diagnosis present

## 2018-04-05 DIAGNOSIS — I5043 Acute on chronic combined systolic (congestive) and diastolic (congestive) heart failure: Secondary | ICD-10-CM | POA: Diagnosis present

## 2018-04-05 DIAGNOSIS — I5023 Acute on chronic systolic (congestive) heart failure: Secondary | ICD-10-CM | POA: Diagnosis not present

## 2018-04-05 DIAGNOSIS — B961 Klebsiella pneumoniae [K. pneumoniae] as the cause of diseases classified elsewhere: Secondary | ICD-10-CM | POA: Diagnosis present

## 2018-04-05 DIAGNOSIS — L03115 Cellulitis of right lower limb: Secondary | ICD-10-CM | POA: Diagnosis not present

## 2018-04-05 DIAGNOSIS — E11621 Type 2 diabetes mellitus with foot ulcer: Secondary | ICD-10-CM | POA: Diagnosis present

## 2018-04-05 DIAGNOSIS — E669 Obesity, unspecified: Secondary | ICD-10-CM | POA: Diagnosis present

## 2018-04-05 DIAGNOSIS — Z79899 Other long term (current) drug therapy: Secondary | ICD-10-CM

## 2018-04-05 DIAGNOSIS — L03119 Cellulitis of unspecified part of limb: Secondary | ICD-10-CM

## 2018-04-05 DIAGNOSIS — N183 Chronic kidney disease, stage 3 (moderate): Secondary | ICD-10-CM | POA: Diagnosis present

## 2018-04-05 DIAGNOSIS — A0471 Enterocolitis due to Clostridium difficile, recurrent: Secondary | ICD-10-CM | POA: Diagnosis present

## 2018-04-05 DIAGNOSIS — E1122 Type 2 diabetes mellitus with diabetic chronic kidney disease: Secondary | ICD-10-CM | POA: Diagnosis present

## 2018-04-05 DIAGNOSIS — N19 Unspecified kidney failure: Secondary | ICD-10-CM | POA: Diagnosis not present

## 2018-04-05 DIAGNOSIS — Z7189 Other specified counseling: Secondary | ICD-10-CM | POA: Diagnosis not present

## 2018-04-05 DIAGNOSIS — E860 Dehydration: Secondary | ICD-10-CM | POA: Diagnosis not present

## 2018-04-05 DIAGNOSIS — I480 Paroxysmal atrial fibrillation: Secondary | ICD-10-CM | POA: Diagnosis present

## 2018-04-05 DIAGNOSIS — J9690 Respiratory failure, unspecified, unspecified whether with hypoxia or hypercapnia: Secondary | ICD-10-CM | POA: Diagnosis not present

## 2018-04-05 DIAGNOSIS — I130 Hypertensive heart and chronic kidney disease with heart failure and stage 1 through stage 4 chronic kidney disease, or unspecified chronic kidney disease: Secondary | ICD-10-CM | POA: Diagnosis not present

## 2018-04-05 DIAGNOSIS — R570 Cardiogenic shock: Secondary | ICD-10-CM | POA: Diagnosis not present

## 2018-04-05 DIAGNOSIS — I361 Nonrheumatic tricuspid (valve) insufficiency: Secondary | ICD-10-CM | POA: Diagnosis not present

## 2018-04-05 DIAGNOSIS — I255 Ischemic cardiomyopathy: Secondary | ICD-10-CM | POA: Diagnosis present

## 2018-04-05 DIAGNOSIS — I2781 Cor pulmonale (chronic): Secondary | ICD-10-CM | POA: Diagnosis present

## 2018-04-05 DIAGNOSIS — K8020 Calculus of gallbladder without cholecystitis without obstruction: Secondary | ICD-10-CM | POA: Diagnosis present

## 2018-04-05 DIAGNOSIS — Z87891 Personal history of nicotine dependence: Secondary | ICD-10-CM

## 2018-04-05 DIAGNOSIS — A419 Sepsis, unspecified organism: Principal | ICD-10-CM | POA: Diagnosis present

## 2018-04-05 DIAGNOSIS — Z7901 Long term (current) use of anticoagulants: Secondary | ICD-10-CM

## 2018-04-05 DIAGNOSIS — Z87442 Personal history of urinary calculi: Secondary | ICD-10-CM

## 2018-04-05 DIAGNOSIS — Z6833 Body mass index (BMI) 33.0-33.9, adult: Secondary | ICD-10-CM

## 2018-04-05 DIAGNOSIS — I4510 Unspecified right bundle-branch block: Secondary | ICD-10-CM | POA: Diagnosis present

## 2018-04-05 DIAGNOSIS — I340 Nonrheumatic mitral (valve) insufficiency: Secondary | ICD-10-CM | POA: Diagnosis not present

## 2018-04-05 DIAGNOSIS — R918 Other nonspecific abnormal finding of lung field: Secondary | ICD-10-CM | POA: Diagnosis not present

## 2018-04-05 DIAGNOSIS — Z955 Presence of coronary angioplasty implant and graft: Secondary | ICD-10-CM

## 2018-04-05 DIAGNOSIS — L0390 Cellulitis, unspecified: Secondary | ICD-10-CM | POA: Diagnosis not present

## 2018-04-05 DIAGNOSIS — R531 Weakness: Secondary | ICD-10-CM | POA: Diagnosis not present

## 2018-04-05 DIAGNOSIS — I5084 End stage heart failure: Secondary | ICD-10-CM | POA: Diagnosis present

## 2018-04-05 HISTORY — DX: Personal history of urinary calculi: Z87.442

## 2018-04-05 HISTORY — DX: Pneumonia, unspecified organism: J18.9

## 2018-04-05 HISTORY — DX: Type 2 diabetes mellitus without complications: E11.9

## 2018-04-05 HISTORY — DX: Unspecified osteoarthritis, unspecified site: M19.90

## 2018-04-05 HISTORY — DX: Acute kidney failure, unspecified: N17.9

## 2018-04-05 LAB — I-STAT ARTERIAL BLOOD GAS, ED
ACID-BASE DEFICIT: 10 mmol/L — AB (ref 0.0–2.0)
BICARBONATE: 16.2 mmol/L — AB (ref 20.0–28.0)
O2 SAT: 92 %
TCO2: 17 mmol/L — AB (ref 22–32)
pCO2 arterial: 33.5 mmHg (ref 32.0–48.0)
pH, Arterial: 7.29 — ABNORMAL LOW (ref 7.350–7.450)
pO2, Arterial: 70 mmHg — ABNORMAL LOW (ref 83.0–108.0)

## 2018-04-05 LAB — COMPREHENSIVE METABOLIC PANEL
ALT: 25 U/L (ref 0–44)
ANION GAP: 14 (ref 5–15)
AST: 21 U/L (ref 15–41)
Albumin: 2.8 g/dL — ABNORMAL LOW (ref 3.5–5.0)
Alkaline Phosphatase: 109 U/L (ref 38–126)
BILIRUBIN TOTAL: 0.6 mg/dL (ref 0.3–1.2)
BUN: 127 mg/dL — ABNORMAL HIGH (ref 8–23)
CALCIUM: 8 mg/dL — AB (ref 8.9–10.3)
CHLORIDE: 96 mmol/L — AB (ref 98–111)
CO2: 19 mmol/L — AB (ref 22–32)
CREATININE: 6.69 mg/dL — AB (ref 0.61–1.24)
GFR, EST AFRICAN AMERICAN: 9 mL/min — AB (ref >60)
GFR, EST NON AFRICAN AMERICAN: 7 mL/min — AB (ref >60)
Glucose, Bld: 149 mg/dL — ABNORMAL HIGH (ref 70–99)
Potassium: 6.3 mmol/L (ref 3.5–5.1)
Sodium: 129 mmol/L — ABNORMAL LOW (ref 135–145)
TOTAL PROTEIN: 6.8 g/dL (ref 6.5–8.1)

## 2018-04-05 LAB — CBC WITH DIFFERENTIAL/PLATELET
Abs Immature Granulocytes: 0.1 10*3/uL (ref 0.0–0.1)
BASOS ABS: 0 10*3/uL (ref 0.0–0.1)
BASOS PCT: 0 %
EOS ABS: 0.1 10*3/uL (ref 0.0–0.7)
Eosinophils Relative: 1 %
HCT: 46.2 % (ref 39.0–52.0)
Hemoglobin: 13.9 g/dL (ref 13.0–17.0)
Immature Granulocytes: 1 %
Lymphocytes Relative: 8 %
Lymphs Abs: 0.9 10*3/uL (ref 0.7–4.0)
MCH: 29.8 pg (ref 26.0–34.0)
MCHC: 30.1 g/dL (ref 30.0–36.0)
MCV: 98.9 fL (ref 78.0–100.0)
MONOS PCT: 8 %
Monocytes Absolute: 0.9 10*3/uL (ref 0.1–1.0)
NEUTROS PCT: 82 %
Neutro Abs: 9.5 10*3/uL — ABNORMAL HIGH (ref 1.7–7.7)
PLATELETS: 247 10*3/uL (ref 150–400)
RBC: 4.67 MIL/uL (ref 4.22–5.81)
RDW: 17.1 % — AB (ref 11.5–15.5)
WBC: 11.5 10*3/uL — ABNORMAL HIGH (ref 4.0–10.5)

## 2018-04-05 LAB — I-STAT CG4 LACTIC ACID, ED: LACTIC ACID, VENOUS: 1.67 mmol/L (ref 0.5–1.9)

## 2018-04-05 LAB — LIPASE, BLOOD: LIPASE: 47 U/L (ref 11–51)

## 2018-04-05 MED ORDER — SODIUM CHLORIDE 0.9 % IV BOLUS (SEPSIS)
500.0000 mL | Freq: Once | INTRAVENOUS | Status: AC
  Administered 2018-04-05: 500 mL via INTRAVENOUS

## 2018-04-05 MED ORDER — VANCOMYCIN HCL IN DEXTROSE 1-5 GM/200ML-% IV SOLN: 1000.0000 mg | Freq: Once | INTRAVENOUS | Status: DC

## 2018-04-05 MED ORDER — ACETAMINOPHEN 325 MG PO TABS: 650.0000 mg | ORAL_TABLET | Freq: Four times a day (QID) | ORAL | Status: DC | PRN

## 2018-04-05 MED ORDER — FERROUS SULFATE 325 (65 FE) MG PO TABS
325.0000 mg | ORAL_TABLET | Freq: Every day | ORAL | Status: DC
  Administered 2018-04-06 – 2018-04-11 (×6): 325 mg via ORAL
  Filled 2018-04-05 (×6): qty 1

## 2018-04-05 MED ORDER — AMIODARONE HCL 200 MG PO TABS
200.0000 mg | ORAL_TABLET | Freq: Two times a day (BID) | ORAL | Status: DC
  Administered 2018-04-06: 200 mg via ORAL
  Filled 2018-04-05: qty 1

## 2018-04-05 MED ORDER — INSULIN ASPART 100 UNIT/ML ~~LOC~~ SOLN
0.0000 [IU] | Freq: Three times a day (TID) | SUBCUTANEOUS | Status: DC
  Administered 2018-04-06: 2 [IU] via SUBCUTANEOUS
  Administered 2018-04-06: 1 [IU] via SUBCUTANEOUS
  Administered 2018-04-06: 2 [IU] via SUBCUTANEOUS
  Administered 2018-04-07: 1 [IU] via SUBCUTANEOUS
  Administered 2018-04-07: 2 [IU] via SUBCUTANEOUS
  Administered 2018-04-07 – 2018-04-09 (×4): 1 [IU] via SUBCUTANEOUS
  Administered 2018-04-10: 2 [IU] via SUBCUTANEOUS
  Administered 2018-04-10 – 2018-04-11 (×2): 1 [IU] via SUBCUTANEOUS

## 2018-04-05 MED ORDER — SODIUM CHLORIDE 0.9 % IV BOLUS (SEPSIS)
1000.0000 mL | Freq: Once | INTRAVENOUS | Status: AC
  Administered 2018-04-05: 1000 mL via INTRAVENOUS

## 2018-04-05 MED ORDER — SODIUM BICARBONATE 8.4 % IV SOLN
50.0000 meq | Freq: Once | INTRAVENOUS | Status: AC
  Administered 2018-04-05: 50 meq via INTRAVENOUS
  Filled 2018-04-05: qty 50

## 2018-04-05 MED ORDER — STERILE WATER FOR INJECTION IV SOLN
INTRAVENOUS | Status: DC
  Administered 2018-04-06 (×2): via INTRAVENOUS
  Filled 2018-04-05 (×3): qty 850

## 2018-04-05 MED ORDER — ACETAMINOPHEN 650 MG RE SUPP: 650.0000 mg | Freq: Four times a day (QID) | RECTAL | Status: DC | PRN

## 2018-04-05 MED ORDER — ATORVASTATIN CALCIUM 20 MG PO TABS
40.0000 mg | ORAL_TABLET | Freq: Every day | ORAL | Status: DC
  Administered 2018-04-06: 40 mg via ORAL
  Filled 2018-04-05: qty 2

## 2018-04-05 MED ORDER — INSULIN ASPART 100 UNIT/ML IV SOLN: 10.0000 [IU] | Freq: Once | INTRAVENOUS | Status: DC

## 2018-04-05 MED ORDER — VANCOMYCIN HCL 10 G IV SOLR
2000.0000 mg | Freq: Once | INTRAVENOUS | Status: AC
  Administered 2018-04-05: 2000 mg via INTRAVENOUS
  Filled 2018-04-05: qty 2000

## 2018-04-05 MED ORDER — PIPERACILLIN-TAZOBACTAM IN DEX 2-0.25 GM/50ML IV SOLN
2.2500 g | Freq: Three times a day (TID) | INTRAVENOUS | Status: DC
  Administered 2018-04-06 – 2018-04-08 (×7): 2.25 g via INTRAVENOUS
  Filled 2018-04-05 (×8): qty 50

## 2018-04-05 MED ORDER — PIPERACILLIN-TAZOBACTAM 3.375 G IVPB 30 MIN
3.3750 g | Freq: Once | INTRAVENOUS | Status: AC
  Administered 2018-04-05: 3.375 g via INTRAVENOUS
  Filled 2018-04-05: qty 50

## 2018-04-05 MED ORDER — DEXTROSE 50 % IV SOLN
1.0000 | Freq: Once | INTRAVENOUS | Status: AC
  Administered 2018-04-05: 50 mL via INTRAVENOUS
  Filled 2018-04-05: qty 50

## 2018-04-05 MED ORDER — INSULIN ASPART 100 UNIT/ML ~~LOC~~ SOLN
10.0000 [IU] | Freq: Once | SUBCUTANEOUS | Status: AC
  Administered 2018-04-05: 10 [IU] via INTRAVENOUS
  Filled 2018-04-05: qty 1

## 2018-04-05 NOTE — Progress Notes (Signed)
Pharmacy Antibiotic Note  Christian Garner is a 72 y.o. male admitted on 03/31/2018 with sepsis.  Pharmacy has been consulted for vancomycin and zosyn dosing. Pt is afebrile and WBC is mildly elevated. SCr is well above baseline at 6.69. Lactic acid is <2.   Plan: Vancomycin 2gm IV x 1 - f/u SCr trend for further doses Zosyn 3.375gm IV x 1 then 2.25mg  IV Q8H F/u renal fxn, C&S, clinical status and trough at SS  Height: 6\' 2"  (188 cm) Weight: 232 lb (105.2 kg) IBW/kg (Calculated) : 82.2  Temp (24hrs), Avg:97.7 F (36.5 C), Min:97.7 F (36.5 C), Max:97.7 F (36.5 C)  Recent Labs  Lab 03/20/2018 1934 03/21/2018 1941  WBC 11.5*  --   CREATININE 6.69*  --   LATICACIDVEN  --  1.67    Estimated Creatinine Clearance: 12.9 mL/min (A) (by C-G formula based on SCr of 6.69 mg/dL (H)).    Allergies  Allergen Reactions  . Metolazone     Per pt family, pt gets delirious and confused when taking metolazone    Antimicrobials this admission: Vanc 6/25>> Zosyn 6/25>>  Dose adjustments this admission: N/A  Microbiology results: Pending  Thank you for allowing pharmacy to be a part of this patient's care.  Tiwan Schnitker, Rande Lawman 03/20/2018 8:56 PM

## 2018-04-05 NOTE — ED Provider Notes (Signed)
[facility]PART[facility] Provider Note   CSN: [id number] Arrival date & time: 03/15/2018  1841     History   Chief Complaint Chief Complaint  Patient presents with  . Weakness    HPI Christian Garner is a 72 y.o. male.  HPI Pt states in the last couple of weeks he has become gradually weaker.  The weakness is generalized. About 10 days ago pts legs started to become red and a rash broke out.  He denies any fevers.  No nausea or vomiting.  No chest pain or shortness of breath.  NO headache.  He has not seen anyone for this since it started.  Today he was too weak to even stand so he called ems. Past Medical History:  Diagnosis Date  . CAD (coronary artery disease)   . Cellulitis and abscess of lower extremity   . CHF (congestive heart failure) (HCC)    EF 30%  . Chronic kidney disease, stage III (moderate) (HCC)   . DM (diabetes mellitus) ([place])   . Recurrent Clostridium difficile diarrhea 09/27/2017    Patient Active Problem List   Diagnosis Date Noted  . ARF (acute renal failure) ([place]) 03/19/2018  . Diabetic foot ulcer ([place])   . Recurrent Clostridium difficile diarrhea 09/27/2017  . CKD (chronic kidney disease) stage 4, GFR 15-29 ml/min (HCC)   . Vomiting   . C. difficile colitis 09/16/2017  . Warfarin anticoagulation 09/16/2017  . Type 2 diabetes mellitus with left diabetic foot ulcer ([place]) 09/16/2017  . Anemia due to chronic kidney disease 09/16/2017  . CKD (chronic kidney disease), stage III ([place]) 09/16/2017  . CAD (coronary artery disease) 06/07/2017  . Recurrent pleural effusion on right   . S/P thoracentesis   . Cellulitis of left lower extremity   . Goals of care, counseling/discussion   . Palliative care encounter   . Hyponatremia   . Pressure injury of skin 03/20/2017  . Acute on chronic combined systolic and diastolic CHF (congestive heart failure) ([place]) 03/19/2017  . Acute on chronic respiratory failure with hypoxia and  hypercapnia ([place]) 03/19/2017  . AF (paroxysmal atrial fibrillation) ([place]) 03/19/2017  . Community acquired pneumonia 03/19/2017  . Pleural effusion   . Acute kidney injury superimposed on chronic kidney disease [facility])     Past Surgical History:  Procedure Laterality Date  . CORONARY ANGIOPLASTY WITH STENT PLACEMENT    . HAND SURGERY     At [facility], [place], around 2014  . IR FLUORO GUIDE CV LINE RIGHT  09/27/2017  . IR REMOVAL TUN CV CATH W/O FL  10/01/2017  . IR US GUIDE VASC ACCESS RIGHT  09/27/2017        Home Medications    Prior to Admission medications   Medication Sig Start Date End Date Taking? Authorizing Provider  acetaminophen (TYLENOL) 325 MG tablet Take 975 mg by mouth at bedtime as needed for mild pain.     [provider]  amiodarone (PACERONE) 200 MG tablet Take 1 tablet (200 mg total) by mouth 2 (two) times daily. 04/05/17   Allie Bossier, MD  atorvastatin (LIPITOR) 40 MG tablet Take 40 mg by mouth at bedtime.     [provider]  bismuth subsalicylate (PEPTO BISMOL) 262 MG/15ML suspension Take 30 mLs by mouth every 6 (six) hours as needed for indigestion.    [provider]  bumetanide (BUMEX) 2 MG tablet Take 2 tablets (4 mg total) by mouth 2 (two) times daily. Patient  taking differently: Take 3 mg by mouth 2 (two) times daily.  06/12/17   Tawny Asal, MD  calcium carbonate (TUMS - DOSED IN MG ELEMENTAL CALCIUM) 500 MG chewable tablet Chew 1 tablet by mouth 4 (four) times daily as needed for indigestion or heartburn.     [provider]  ferrous sulfate 325 (65 FE) MG tablet Take 325 mg by mouth daily with breakfast.    [provider]  fluticasone (FLONASE) 50 MCG/ACT nasal spray Place 1 spray into both nostrils daily as needed for allergies or rhinitis.    [provider]  Hydrocortisone (GERHARDT'S BUTT CREAM) CREA Apply 1 application topically 2 (two) times daily. Patient not taking: Reported on 03/21/2018  10/01/17   Edwin Dada, MD  nitroGLYCERIN (NITROSTAT) 0.4 MG SL tablet Place 0.4 mg under the tongue every 5 (five) minutes as needed for chest pain.    [provider]  oxyCODONE (OXY IR/ROXICODONE) 5 MG immediate release tablet Take 1 tablet (5 mg total) by mouth every 4 (four) hours as needed for moderate pain. Patient not taking: Reported on 03/17/2018 10/01/17   Edwin Dada, MD  pantoprazole (PROTONIX) 40 MG tablet Take 40 mg by mouth daily. 02/01/18   [provider]  potassium chloride SA (K-DUR,KLOR-CON) 20 MEQ tablet Take 2 tablets (40 mEq total) by mouth 2 (two) times daily. Patient not taking: Reported on 09/16/2017 06/12/17   Tawny Asal, MD  sitaGLIPtin (JANUVIA) 100 MG tablet Take 100 mg by mouth daily.    [provider]  traZODone (DESYREL) 50 MG tablet Take 50 mg by mouth at bedtime.    [provider]  vitamin B-12 (CYANOCOBALAMIN) 1000 MCG tablet Take 1,000 mcg by mouth daily.    [provider]  warfarin (COUMADIN) 2 MG tablet Take 1 tablet (2 mg total) by mouth daily. Patient taking differently: Take 4 mg by mouth See admin instructions. Take 2 tablets (4mg ) by mouth once daily with 1/2 1mg  tablet (0.5mg ) to equal 4.5mg  daily 06/12/17   Tawny Asal, MD    Family History Family History  Problem Relation Age of Onset  . Hypertension Mother   . Diabetes Mother   . Bone cancer Mother   . Hypertension Father   . Bladder Cancer Sister     Social History Social History   Tobacco Use  . Smoking status: Former Research scientist (life sciences)  . Smokeless tobacco: Never Used  Substance Use Topics  . Alcohol use: No  . Drug use: No     Allergies   Metolazone   Review of Systems Review of Systems  Constitutional: Negative for fever.  Respiratory: Negative for shortness of breath.   Cardiovascular: Negative for chest pain.  Gastrointestinal: Negative for diarrhea and vomiting.  Genitourinary: Positive for dysuria.  Skin:  Positive for rash and wound.  All other systems reviewed and are negative.    Physical Exam Updated Vital Signs BP 103/63   Pulse (!) 52   Temp 97.7 F (36.5 C) (Oral)   Resp 16   Ht 1.88 m (6\' 2" )   Wt 105.2 kg (232 lb)   SpO2 97%   BMI 29.79 kg/m   Physical Exam  Constitutional: He appears well-developed and well-nourished. No distress.  Ill appearing  HENT:  Head: Normocephalic and atraumatic.  Right Ear: External ear normal.  Left Ear: External ear normal.  Eyes: Conjunctivae are normal. Right eye exhibits no discharge. Left eye exhibits no discharge. No scleral icterus.  Neck: Neck supple. No  tracheal deviation present.  Cardiovascular: Normal rate, regular rhythm and intact distal pulses.  Pulmonary/Chest: Effort normal and breath sounds normal. No stridor. No respiratory distress. He has no wheezes. He has no rales.  Abdominal: Soft. Bowel sounds are normal. He exhibits no distension. There is tenderness. There is no rebound and no guarding.  Mild generalized  Musculoskeletal: He exhibits edema. He exhibits no tenderness.  Lower extremities are cool, unable to palpate pulses, weeping erythematous ulcerative wounds on lower legs, see images, erythema up towards knees  Neurological: He is alert. He has normal strength. No cranial nerve deficit (no facial droop, extraocular movements intact, no slurred speech) or sensory deficit. He exhibits normal muscle tone. He displays no seizure activity. Coordination normal.  Skin: Skin is warm and dry. No rash noted.  Psychiatric: He has a normal mood and affect.  Nursing note and vitals reviewed.    ED Treatments / Results  Labs (all labs ordered are listed, but only abnormal results are displayed) Labs Reviewed  COMPREHENSIVE METABOLIC PANEL - Abnormal; Notable for the following components:      Result Value   Sodium 129 (*)    Potassium 6.3 (*)    Chloride 96 (*)    CO2 19 (*)    Glucose, Bld 149 (*)    BUN 127 (*)     Creatinine, Ser 6.69 (*)    Calcium 8.0 (*)    Albumin 2.8 (*)    GFR calc non Af Amer 7 (*)    GFR calc Af Amer 9 (*)    All other components within normal limits  CBC WITH DIFFERENTIAL/PLATELET - Abnormal; Notable for the following components:   WBC 11.5 (*)    RDW 17.1 (*)    Neutro Abs 9.5 (*)    All other components within normal limits  CULTURE, BLOOD (ROUTINE X 2)  CULTURE, BLOOD (ROUTINE X 2)  URINE CULTURE  LIPASE, BLOOD  URINALYSIS, ROUTINE W REFLEX MICROSCOPIC  I-STAT CG4 LACTIC ACID, ED  I-STAT CG4 LACTIC ACID, ED    EKG EKG Interpretation  Date/Time:  Tuesday April 05 2018 18:42:17 EDT Ventricular Rate:  57 PR Interval:    QRS Duration: 217 QT Interval:  540 QTC Calculation: 526 R Axis:   -112 Text Interpretation:  Sinus rhythm Prolonged PR interval , increased since last tracing Right bundle branch block Confirmed by Dorie Rank [phone]) on 04/04/2018 6:48:00 PM   Radiology Dg Chest Port 1 View  Result Date: 03/26/2018 CLINICAL DATA:  Weakness EXAM: PORTABLE CHEST 1 VIEW COMPARISON:  11/15/2017 chest radiograph FINDINGS: Cardiomegaly with calcific aortic atherosclerosis. There is increased bibasilar airspace opacity without large consolidation. No overt pulmonary edema. No pneumothorax or sizable pleural effusion. IMPRESSION: Cardiomegaly and calcific aortic atherosclerosis (ICD10-I70.0). Bibasilar airspace opacities may indicate atelectasis and/or infection. If the patient is able, an upright PA and lateral series might be helpful. Electronically Signed   By: Ulyses Jarred M.D.   On: 03/12/2018 20:20    Procedures .Critical Care Performed by: Dorie Rank, MD Authorized by: Dorie Rank, MD   Critical care provider statement:    Critical care time (minutes):  45   Critical care was time spent personally by me on the following activities:  Discussions with consultants, evaluation of patient's response to treatment, examination of patient, ordering and performing  treatments and interventions, ordering and review of laboratory studies, ordering and review of radiographic studies, pulse oximetry, re-evaluation of patient's condition, obtaining history from patient or surrogate and review of  old charts   (including critical care time)  Medications Ordered in ED Medications  piperacillin-tazobactam (ZOSYN) IVPB 2.25 g (has no administration in time range)  dextrose 50 % solution 50 mL (has no administration in time range)  sodium bicarbonate injection 50 mEq (has no administration in time range)  insulin aspart (novoLOG) injection 10 Units (has no administration in time range)  piperacillin-tazobactam (ZOSYN) IVPB 3.375 g (0 g Intravenous Stopped 03/16/2018 2048)  sodium chloride 0.9 % bolus 1,000 mL (0 mLs Intravenous Stopped 03/13/2018 2048)    And  sodium chloride 0.9 % bolus 1,000 mL (0 mLs Intravenous Stopped 04/02/2018 2000)    And  sodium chloride 0.9 % bolus 1,000 mL (0 mLs Intravenous Stopped 04/04/2018 2204)    And  sodium chloride 0.9 % bolus 500 mL (500 mLs Intravenous New Bag/Given 04/06/2018 2100)  vancomycin (VANCOCIN) 2,000 mg in sodium chloride 0.9 % 500 mL IVPB (0 mg Intravenous Stopped 03/31/2018 2206)     Initial Impression / Assessment and Plan / ED Course  I have reviewed the triage vital signs and the nursing notes.  Pertinent labs & imaging results that were available during my care of the patient were reviewed by me and considered in my medical decision making (see chart for details).  Clinical Course as of Apr 05 2216  Tue Apr 05, 2018  2216 Labs notable for acute renal failure with hyperkalemia.     [JK]  2216 CXR with atelectasis vs infection   [JK]    Clinical Course User Index [JK] Dorie Rank, MD   Patient presented to the emergency room with complaints of weakness.  Patient's exam was notable for weeping ulcerative wounds in his lower extremities and findings consistent with cellulitis.  Patient was hydrated with IV fluids for  possible sepsis.  His blood pressure has improved.  Patient has mentioned that he is not been able to provide a urine sample in the ED.  Laboratory tests were notable for acute renal failure.  Etiology this may be multifactorial including sepsis, infection and dehydration on top of his chronic kidney disease.  There may be a medication a component as well.  Patient was started on IV antibiotics.  Have ordered insulin and glucose and sodium bicarb for the hyperkalemia.  Get a bladder scan to make sure there is no evidence of bladder outlet obstruction.  Patient will need to be monitored closely.  Admit to the hospital for further treatment.  Final Clinical Impressions(s) / ED Diagnoses   Final diagnoses:  Acute renal failure, unspecified acute renal failure type ([place])  Hyperkalemia  Cellulitis of lower extremity, unspecified laterality  Hypotension, unspecified hypotension type      Dorie Rank, MD 03/17/2018 2219

## 2018-04-05 NOTE — ED Notes (Signed)
Bladder scan shows ~85ml

## 2018-04-05 NOTE — H&P (Addendum)
History and Physical    Christian Garner QHU:[id number] DOB: 1946-05-11 DOA: 03/22/2018  PCP: [name], MD  Patient coming from: Home.  Chief Complaint: Weakness.  HPI: Christian Garner is a 72 y.o. male with history of chronic systolic CHF last EF measured in June 2018 was 28 to 35%, CAD status post PTCA, A. fib on Coumadin, chronic kidney disease stage III requiring dialysis briefly early part of 2018 was brought to the ER because of worsening bilateral lower extremity cellulitis and weakness.  As per patient's wife patient has been not eating well for last 1 week and also has been having worsening cellulitis of his both lower extremity with increasing discharge.  Patient has been taking Bumex twice daily as prescribed despite not eating well.  Over the last 24 hours patient found it difficult to move because of profound weakness.  At the same time patient also had some difficulty urinating.  Denies any chest pain or abdominal pain.  Abdomen appears distended which  has been so as per the family.  ED Course: In the ER patient is found to be hypotensive and was given fluid bolus.  Chest x-ray shows congestion and possible infiltrate.  Both bilateral lower extremity appears erythematous and anterior skin is slightly sloughed off.  Patient's creatinine is markedly elevated at 6.69 with potassium of 6.3.  EKG shows sinus rhythm with RBBB.  Following given fluid bolus of 20 have that his blood pressure improved.  Bladder scan only shows 80 cc of fluid in the bladder.  Patient also states over the last 24 hours he had difficulty urinating.  ABG shows metabolic acidosis.  Blood cultures were obtained and started on empiric antibiotics for his colitis and possible pneumonia.  CT renal studies pending to rule out any obstruction.  Review of Systems: As per HPI, rest all negative.   Past Medical History:  Diagnosis Date  . CAD (coronary artery disease)   . Cellulitis and abscess of lower extremity    . CHF (congestive heart failure) (HCC)    EF 30%  . Chronic kidney disease, stage III (moderate) (HCC)   . DM (diabetes mellitus) ([place])   . Recurrent Clostridium difficile diarrhea 09/27/2017    Past Surgical History:  Procedure Laterality Date  . CORONARY ANGIOPLASTY WITH STENT PLACEMENT    . HAND SURGERY     At [facility], around 2014  . IR FLUORO GUIDE CV LINE RIGHT  09/27/2017  . IR REMOVAL TUN CV CATH W/O FL  10/01/2017  . IR US GUIDE VASC ACCESS RIGHT  09/27/2017     reports that he has quit smoking. He has never used smokeless tobacco. He reports that he does not drink alcohol or use drugs.  Allergies  Allergen Reactions  . Metolazone     Per pt family, pt gets delirious and confused when taking metolazone    Family History  Problem Relation Age of Onset  . Hypertension Mother   . Diabetes Mother   . Bone cancer Mother   . Hypertension Father   . Bladder Cancer Sister     Prior to Admission medications   Medication Sig Start Date End Date Taking? Authorizing Provider  acetaminophen (TYLENOL) 325 MG tablet Take 975 mg by mouth at bedtime as needed for mild pain.     [provider]  amiodarone (PACERONE) 200 MG tablet Take 1 tablet (200 mg total) by mouth 2 (two) times daily. 04/05/17   Allie Bossier, MD  atorvastatin (LIPITOR) 40 MG tablet Take 40 mg by mouth at bedtime.     [provider]  bismuth subsalicylate (PEPTO BISMOL) 262 MG/15ML suspension Take 30 mLs by mouth every 6 (six) hours as needed for indigestion.    [provider]  bumetanide (BUMEX) 2 MG tablet Take 2 tablets (4 mg total) by mouth 2 (two) times daily. Patient taking differently: Take 3 mg by mouth 2 (two) times daily.  06/12/17   Tawny Asal, MD  calcium carbonate (TUMS - DOSED IN MG ELEMENTAL CALCIUM) 500 MG chewable tablet Chew 1 tablet by mouth 4 (four) times daily as needed for indigestion or heartburn.     [provider]  ferrous sulfate 325 (65  FE) MG tablet Take 325 mg by mouth daily with breakfast.    [provider]  fluticasone (FLONASE) 50 MCG/ACT nasal spray Place 1 spray into both nostrils daily as needed for allergies or rhinitis.    [provider]  Hydrocortisone (GERHARDT'S BUTT CREAM) CREA Apply 1 application topically 2 (two) times daily. Patient not taking: Reported on 03/22/2018 10/01/17   Edwin Dada, MD  nitroGLYCERIN (NITROSTAT) 0.4 MG SL tablet Place 0.4 mg under the tongue every 5 (five) minutes as needed for chest pain.    [provider]  oxyCODONE (OXY IR/ROXICODONE) 5 MG immediate release tablet Take 1 tablet (5 mg total) by mouth every 4 (four) hours as needed for moderate pain. Patient not taking: Reported on 04/04/2018 10/01/17   Edwin Dada, MD  pantoprazole (PROTONIX) 40 MG tablet Take 40 mg by mouth daily. 02/01/18   [provider]  potassium chloride SA (K-DUR,KLOR-CON) 20 MEQ tablet Take 2 tablets (40 mEq total) by mouth 2 (two) times daily. Patient not taking: Reported on 09/16/2017 06/12/17   Tawny Asal, MD  sitaGLIPtin (JANUVIA) 100 MG tablet Take 100 mg by mouth daily.    [provider]  traZODone (DESYREL) 50 MG tablet Take 50 mg by mouth at bedtime.    [provider]  vitamin B-12 (CYANOCOBALAMIN) 1000 MCG tablet Take 1,000 mcg by mouth daily.    [provider]  warfarin (COUMADIN) 2 MG tablet Take 1 tablet (2 mg total) by mouth daily. Patient taking differently: Take 4 mg by mouth See admin instructions. Take 2 tablets (4mg ) by mouth once daily with 1/2 1mg  tablet (0.5mg ) to equal 4.5mg  daily 06/12/17   Tawny Asal, MD    Physical Exam: Vitals:   03/30/2018 2030 04/08/2018 2130 03/31/2018 2230 03/16/2018 2300  BP: 94/63 103/63 105/60 107/68  Pulse: (!) 50 (!) 52 (!) 55 (!) 55  Resp: 20 16 (!) 21 20  Temp:      TempSrc:      SpO2: 95% 97% 97% 100%  Weight:      Height:          Constitutional: Moderately built  and nourished. Vitals:   03/18/2018 2030 03/15/2018 2130 03/17/2018 2230 03/25/2018 2300  BP: 94/63 103/63 105/60 107/68  Pulse: (!) 50 (!) 52 (!) 55 (!) 55  Resp: 20 16 (!) 21 20  Temp:      TempSrc:      SpO2: 95% 97% 97% 100%  Weight:      Height:       Eyes: Anicteric no pallor. ENMT: No discharge from the ears eyes nose or mouth. Neck: No mass felt.  No neck rigidity.  No JVD appreciated. Respiratory: No rhonchi or crepitations. Cardiovascular: S1-S2 heard no murmurs appreciated.  Abdomen: Distended nontender bowel sounds present. Musculoskeletal: Bilateral lower extremity edema with erythema of both lower extremity. Skin: Bilateral lower extremity edema with erythema of the both lower extremity skin with sloughing. Neurologic: Alert awake oriented to time place and person.  Moves all extremities. Psychiatric: Appears normal.  Normal affect.   Labs on Admission: I have personally reviewed following labs and imaging studies  CBC: Recent Labs  Lab 03/21/2018 1934  WBC 11.5*  NEUTROABS 9.5*  HGB 13.9  HCT 46.2  MCV 98.9  PLT 277   Basic Metabolic Panel: Recent Labs  Lab 03/18/2018 1934  NA 129*  K 6.3*  CL 96*  CO2 19*  GLUCOSE 149*  BUN 127*  CREATININE 6.69*  CALCIUM 8.0*   GFR: Estimated Creatinine Clearance: 12.9 mL/min (A) (by C-G formula based on SCr of 6.69 mg/dL (H)). Liver Function Tests: Recent Labs  Lab 04/06/2018 1934  AST 21  ALT 25  ALKPHOS 109  BILITOT 0.6  PROT 6.8  ALBUMIN 2.8*   Recent Labs  Lab 04/03/2018 1934  LIPASE 47   No results for input(s): AMMONIA in the last 168 hours. Coagulation Profile: No results for input(s): INR, PROTIME in the last 168 hours. Cardiac Enzymes: No results for input(s): CKTOTAL, CKMB, CKMBINDEX, TROPONINI in the last 168 hours. BNP (last 3 results) No results for input(s): PROBNP in the last 8760 hours. HbA1C: No results for input(s): HGBA1C in the last 72 hours. CBG: No results for input(s): GLUCAP in the  last 168 hours. Lipid Profile: No results for input(s): CHOL, HDL, LDLCALC, TRIG, CHOLHDL, LDLDIRECT in the last 72 hours. Thyroid Function Tests: No results for input(s): TSH, T4TOTAL, FREET4, T3FREE, THYROIDAB in the last 72 hours. Anemia Panel: No results for input(s): VITAMINB12, FOLATE, FERRITIN, TIBC, IRON, RETICCTPCT in the last 72 hours. Urine analysis:    Component Value Date/Time   COLORURINE YELLOW 09/16/2017 1753   APPEARANCEUR HAZY (A) 09/16/2017 1753   LABSPEC >1.030 (H) 09/16/2017 1753   PHURINE 5.0 09/16/2017 1753   GLUCOSEU NEGATIVE 09/16/2017 1753   HGBUR MODERATE (A) 09/16/2017 1753   BILIRUBINUR NEGATIVE 09/16/2017 1753   KETONESUR NEGATIVE 09/16/2017 1753   PROTEINUR NEGATIVE 09/16/2017 1753   NITRITE NEGATIVE 09/16/2017 1753   LEUKOCYTESUR NEGATIVE 09/16/2017 1753   Sepsis Labs: @LABRCNTIP (procalcitonin:4,lacticidven:4) )No results found for this or any previous visit (from the past 240 hour(s)).   Radiological Exams on Admission: Dg Chest Port 1 View  Result Date: 04/03/2018 CLINICAL DATA:  Weakness EXAM: PORTABLE CHEST 1 VIEW COMPARISON:  11/15/2017 chest radiograph FINDINGS: Cardiomegaly with calcific aortic atherosclerosis. There is increased bibasilar airspace opacity without large consolidation. No overt pulmonary edema. No pneumothorax or sizable pleural effusion. IMPRESSION: Cardiomegaly and calcific aortic atherosclerosis (ICD10-I70.0). Bibasilar airspace opacities may indicate atelectasis and/or infection. If the patient is able, an upright PA and lateral series might be helpful. Electronically Signed   By: Ulyses Jarred M.D.   On: 03/30/2018 20:20    EKG: Independently reviewed.  Normal sinus rhythm with RBBB.  Assessment/Plan Principal Problem:   ARF (acute renal failure) (HCC) Active Problems:   AF (paroxysmal atrial fibrillation) (HCC)   Type 2 diabetes mellitus with left diabetic foot ulcer (HCC)   Bilateral cellulitis of lower leg    Chronic systolic CHF (congestive heart failure) ([place])   Acute renal failure (ARF) ([place])    1. Acute on chronic renal failure at this time appears to be oliguric -CT renal study is pending to rule out obstruction.  UA and  FENa are pending.  I have discussed with on-call nephrologist Dr. Lorrene Reid.  At this time since patient was hypotensive and has not been eating well plan is to hydrate patient and closely follow intake output metabolic panel.  Since patient is acidotic bicarbonate drip has been started.  Closely observing stepdown to watch out for any respiratory failure.  For the hyperkalemia patient did receive D50 and bicarbonate in the ER.  Discontinue Bumex. 2. Bilateral lower examinee cellulitis -will eventually need CT scan to rule out a deep abscess.  Patient is placed on empiric antibiotics.  Patient did receive vancomycin but will order further dose of with Zyvox to avoid any renal toxicities.  Discussed with pharmacy and pharmacy advised okay to continue with Zosyn.  Follow blood cultures. 3. Chronic systolic heart failure last EF measured in June 2018 was 30 to 35% -holding Bumex due to acute renal failure.  Closely follow respiratory status. 4. History of atrial fibrillation -hold Coumadin will place patient on heparin if INR is less than 2.  On amiodarone.  Dose discussed with pharmacy. 5. History of CAD status post PTCA -denies any chest pain cycle cardiac markers. 6. Hypotension differentials include dehydration or possible developing sepsis.  Follow blood cultures continue hydration see #1. 7. Diabetes mellitus type 2 off medications at this time.  Follow CBGs. 8. History of having dialysis briefly in early part of 2018.     DVT prophylaxis: Heparin. Code Status: Full code. Family Communication: Patient's wife. Disposition Plan: To be determined. Consults called: Nephrology PCCM. Admission status: Inpatient.   Rise Patience MD [facility] Pager [phone].  If  7PM-7AM, please contact night-coverage [email or web address] Password [facility]  04/02/2018, 11:56 PM

## 2018-04-05 NOTE — ED Notes (Signed)
Pt can't provide urine sample at this time

## 2018-04-05 NOTE — ED Triage Notes (Signed)
Pt arrives to ED from home with complaints of weakness since 2 weeks ago, but worsening the last 4-5 days with today being the worst. EMS reports pt is hypotensive and has open weeping sores bilaterally on his legs. Pt placed in position of comfort with bed locked and lowered, call bell in reach.

## 2018-04-06 ENCOUNTER — Other Ambulatory Visit: Payer: Self-pay

## 2018-04-06 ENCOUNTER — Inpatient Hospital Stay (HOSPITAL_COMMUNITY): Payer: Medicare Other

## 2018-04-06 ENCOUNTER — Encounter (HOSPITAL_COMMUNITY): Payer: Self-pay | Admitting: General Practice

## 2018-04-06 DIAGNOSIS — R570 Cardiogenic shock: Secondary | ICD-10-CM

## 2018-04-06 DIAGNOSIS — L0390 Cellulitis, unspecified: Secondary | ICD-10-CM

## 2018-04-06 DIAGNOSIS — I959 Hypotension, unspecified: Secondary | ICD-10-CM

## 2018-04-06 DIAGNOSIS — L03115 Cellulitis of right lower limb: Secondary | ICD-10-CM

## 2018-04-06 DIAGNOSIS — I480 Paroxysmal atrial fibrillation: Secondary | ICD-10-CM

## 2018-04-06 DIAGNOSIS — N179 Acute kidney failure, unspecified: Secondary | ICD-10-CM

## 2018-04-06 DIAGNOSIS — L03116 Cellulitis of left lower limb: Secondary | ICD-10-CM

## 2018-04-06 DIAGNOSIS — E875 Hyperkalemia: Secondary | ICD-10-CM

## 2018-04-06 HISTORY — DX: Acute kidney failure, unspecified: N17.9

## 2018-04-06 LAB — PROTIME-INR
INR: >10
INR: 3.37
INR: 1.69
PROTHROMBIN TIME: 33.9 s — AB (ref 11.4–15.2)
Prothrombin Time: >90 seconds — ABNORMAL HIGH (ref 11.4–15.2)
Prothrombin Time: 19.7 seconds — ABNORMAL HIGH (ref 11.4–15.2)

## 2018-04-06 LAB — BASIC METABOLIC PANEL
Anion gap: 12 (ref 5–15)
Anion gap: 9 (ref 5–15)
BUN: 119 mg/dL — ABNORMAL HIGH (ref 8–23)
BUN: 114 mg/dL — ABNORMAL HIGH (ref 8–23)
CALCIUM: 7.4 mg/dL — AB (ref 8.9–10.3)
CHLORIDE: 100 mmol/L (ref 98–111)
CO2: 20 mmol/L — ABNORMAL LOW (ref 22–32)
CO2: 22 mmol/L (ref 22–32)
Calcium: 7.5 mg/dL — ABNORMAL LOW (ref 8.9–10.3)
Chloride: 103 mmol/L (ref 98–111)
Creatinine, Ser: 6.24 mg/dL — ABNORMAL HIGH (ref 0.61–1.24)
Creatinine, Ser: 5.95 mg/dL — ABNORMAL HIGH (ref 0.61–1.24)
GFR calc Af Amer: 9 mL/min — ABNORMAL LOW (ref >60)
GFR calc Af Amer: 10 mL/min — ABNORMAL LOW (ref >60)
GFR calc non Af Amer: 8 mL/min — ABNORMAL LOW (ref >60)
GFR calc non Af Amer: 8 mL/min — ABNORMAL LOW (ref >60)
Glucose, Bld: 94 mg/dL (ref 70–99)
Glucose, Bld: 160 mg/dL — ABNORMAL HIGH (ref 70–99)
POTASSIUM: 6.2 mmol/L — AB (ref 3.5–5.1)
Potassium: 6 mmol/L — ABNORMAL HIGH (ref 3.5–5.1)
Sodium: 135 mmol/L (ref 135–145)
Sodium: 131 mmol/L — ABNORMAL LOW (ref 135–145)

## 2018-04-06 LAB — URINALYSIS, ROUTINE W REFLEX MICROSCOPIC
Bilirubin Urine: NEGATIVE
Bilirubin Urine: NEGATIVE
GLUCOSE, UA: NEGATIVE mg/dL
GLUCOSE, UA: NEGATIVE mg/dL
KETONES UR: NEGATIVE mg/dL
Ketones, ur: NEGATIVE mg/dL
Nitrite: NEGATIVE
Nitrite: NEGATIVE
PH: 5 (ref 5.0–8.0)
PROTEIN: 100 mg/dL — AB
PROTEIN: 100 mg/dL — AB
Specific Gravity, Urine: 1.013 (ref 1.005–1.030)
Specific Gravity, Urine: 1.015 (ref 1.005–1.030)
WBC, UA: >50 WBC/hpf — ABNORMAL HIGH (ref 0–5)
WBC, UA: >50 WBC/hpf — ABNORMAL HIGH (ref 0–5)
pH: 5 (ref 5.0–8.0)

## 2018-04-06 LAB — HEPATIC FUNCTION PANEL
ALK PHOS: 99 U/L (ref 38–126)
ALT: 26 U/L (ref 0–44)
AST: 19 U/L (ref 15–41)
Albumin: 2.5 g/dL — ABNORMAL LOW (ref 3.5–5.0)
BILIRUBIN DIRECT: 0.2 mg/dL (ref 0.0–0.2)
BILIRUBIN INDIRECT: 0.5 mg/dL (ref 0.3–0.9)
Total Bilirubin: 0.7 mg/dL (ref 0.3–1.2)
Total Protein: 6.3 g/dL — ABNORMAL LOW (ref 6.5–8.1)

## 2018-04-06 LAB — CBC WITH DIFFERENTIAL/PLATELET
Abs Immature Granulocytes: 0.1 10*3/uL (ref 0.0–0.1)
Basophils Absolute: 0 10*3/uL (ref 0.0–0.1)
Basophils Relative: 0 %
Eosinophils Absolute: 0.1 10*3/uL (ref 0.0–0.7)
Eosinophils Relative: 1 %
HCT: 42.8 % (ref 39.0–52.0)
Hemoglobin: 13.4 g/dL (ref 13.0–17.0)
Immature Granulocytes: 0 %
Lymphocytes Relative: 10 %
Lymphs Abs: 1.3 10*3/uL (ref 0.7–4.0)
MCH: 30.5 pg (ref 26.0–34.0)
MCHC: 31.3 g/dL (ref 30.0–36.0)
MCV: 97.5 fL (ref 78.0–100.0)
Monocytes Absolute: 1.2 10*3/uL — ABNORMAL HIGH (ref 0.1–1.0)
Monocytes Relative: 10 %
Neutro Abs: 9.6 10*3/uL — ABNORMAL HIGH (ref 1.7–7.7)
Neutrophils Relative %: 79 %
Platelets: 231 10*3/uL (ref 150–400)
RBC: 4.39 MIL/uL (ref 4.22–5.81)
RDW: 17.1 % — ABNORMAL HIGH (ref 11.5–15.5)
WBC: 12.3 10*3/uL — ABNORMAL HIGH (ref 4.0–10.5)

## 2018-04-06 LAB — GLUCOSE, CAPILLARY
GLUCOSE-CAPILLARY: 130 mg/dL — AB (ref 70–99)
GLUCOSE-CAPILLARY: 168 mg/dL — AB (ref 70–99)
GLUCOSE-CAPILLARY: 149 mg/dL — AB (ref 70–99)
Glucose-Capillary: 124 mg/dL — ABNORMAL HIGH (ref 70–99)
Glucose-Capillary: 157 mg/dL — ABNORMAL HIGH (ref 70–99)

## 2018-04-06 LAB — LACTIC ACID, PLASMA
LACTIC ACID, VENOUS: 2.4 mmol/L — AB (ref 0.5–1.9)
Lactic Acid, Venous: 2.1 mmol/L (ref 0.5–1.9)

## 2018-04-06 LAB — TSH: TSH: 7.258 u[IU]/mL — ABNORMAL HIGH (ref 0.350–4.500)

## 2018-04-06 LAB — PROTEIN / CREATININE RATIO, URINE
Creatinine, Urine: 151.04 mg/dL
PROTEIN CREATININE RATIO: 0.9 mg/mg{creat} — AB (ref 0.00–0.15)
TOTAL PROTEIN, URINE: 136 mg/dL

## 2018-04-06 LAB — BRAIN NATRIURETIC PEPTIDE: B Natriuretic Peptide: 1115.5 pg/mL — ABNORMAL HIGH (ref 0.0–100.0)

## 2018-04-06 LAB — TROPONIN I: Troponin I: 0.04 ng/mL (ref <0.03)

## 2018-04-06 LAB — PHOSPHORUS: Phosphorus: 6.4 mg/dL — ABNORMAL HIGH (ref 2.5–4.6)

## 2018-04-06 LAB — PROCALCITONIN: PROCALCITONIN: 0.23 ng/mL

## 2018-04-06 LAB — MRSA PCR SCREENING: MRSA by PCR: NEGATIVE

## 2018-04-06 LAB — TYPE AND SCREEN
ABO/RH(D): A NEG
Antibody Screen: NEGATIVE

## 2018-04-06 LAB — SODIUM, URINE, RANDOM: Sodium, Ur: 14 mmol/L

## 2018-04-06 LAB — CREATININE, URINE, RANDOM: Creatinine, Urine: 136.65 mg/dL

## 2018-04-06 LAB — T4, FREE: Free T4: 1 ng/dL (ref 0.82–1.77)

## 2018-04-06 LAB — CK: Total CK: 194 U/L (ref 49–397)

## 2018-04-06 MED ORDER — SODIUM CHLORIDE 0.9 % IV SOLN
INTRAVENOUS | Status: DC | PRN
  Administered 2018-04-08 – 2018-04-09 (×4): via INTRAVENOUS

## 2018-04-06 MED ORDER — FUROSEMIDE 10 MG/ML IJ SOLN
40.0000 mg | Freq: Once | INTRAMUSCULAR | Status: AC
  Administered 2018-04-06: 40 mg via INTRAVENOUS
  Filled 2018-04-06: qty 4

## 2018-04-06 MED ORDER — PATIROMER SORBITEX CALCIUM 8.4 G PO PACK
16.8000 g | PACK | Freq: Every day | ORAL | Status: DC
  Administered 2018-04-06: 16.8 g via ORAL
  Filled 2018-04-06 (×2): qty 2

## 2018-04-06 MED ORDER — SODIUM CHLORIDE 0.9 % IV SOLN
Freq: Once | INTRAVENOUS | Status: AC
  Administered 2018-04-06: 08:00:00 via INTRAVENOUS
  Filled 2018-04-06: qty 10

## 2018-04-06 MED ORDER — PATIROMER SORBITEX CALCIUM 8.4 G PO PACK
16.8000 g | PACK | Freq: Every day | ORAL | Status: DC
  Filled 2018-04-06: qty 2

## 2018-04-06 MED ORDER — SODIUM CHLORIDE 0.9 % IV BOLUS: 500.0000 mL | Freq: Once | INTRAVENOUS | Status: DC

## 2018-04-06 MED ORDER — VITAMIN K1 10 MG/ML IJ SOLN
5.0000 mg | Freq: Once | INTRAVENOUS | Status: AC
  Administered 2018-04-06: 5 mg via INTRAVENOUS
  Filled 2018-04-06: qty 0.5

## 2018-04-06 MED ORDER — SODIUM CHLORIDE 0.9 % IV BOLUS
250.0000 mL | Freq: Once | INTRAVENOUS | Status: AC
  Administered 2018-04-06: 250 mL via INTRAVENOUS

## 2018-04-06 MED ORDER — NEPRO/CARBSTEADY PO LIQD
237.0000 mL | Freq: Two times a day (BID) | ORAL | Status: DC
  Administered 2018-04-07: 237 mL via ORAL

## 2018-04-06 MED ORDER — MIDODRINE HCL 5 MG PO TABS
10.0000 mg | ORAL_TABLET | Freq: Three times a day (TID) | ORAL | Status: DC
  Administered 2018-04-06 – 2018-04-11 (×15): 10 mg via ORAL
  Filled 2018-04-06 (×15): qty 2

## 2018-04-06 MED ORDER — SODIUM CHLORIDE 0.9 % IV BOLUS
500.0000 mL | Freq: Once | INTRAVENOUS | Status: AC
  Administered 2018-04-06: 500 mL via INTRAVENOUS

## 2018-04-06 MED ORDER — LINEZOLID 600 MG/300ML IV SOLN: 600.0000 mg | Freq: Two times a day (BID) | INTRAVENOUS | Status: DC

## 2018-04-06 NOTE — Progress Notes (Signed)
ANTICOAGULATION CONSULT NOTE - Initial Consult  Pharmacy Consult for heparin Indication: atrial fibrillation  Allergies  Allergen Reactions  . Metolazone     Per pt family, pt gets delirious and confused when taking metolazone    Patient Measurements: Height: 6\' 2"  (188 cm) Weight: 232 lb (105.2 kg) IBW/kg (Calculated) : 82.2 Heparin Dosing Weight: 103.5  Vital Signs: Temp: 97.7 F (36.5 C) (06/25 1848) Temp Source: Oral (06/25 1848) BP: 96/61 (06/26 0111) Pulse Rate: 50 (06/26 0111)  Labs: Recent Labs    04/07/2018 1934 04/06/18 0121  HGB 13.9 13.4  HCT 46.2 42.8  PLT 247 231  LABPROT  --  >90.0*  INR  --  >10.00*  CREATININE 6.69* 6.24*  CKTOTAL  --  194  TROPONINI  --  0.04*    Estimated Creatinine Clearance: 13.8 mL/min (A) (by C-G formula based on SCr of 6.24 mg/dL (H)).   Medical History: Past Medical History:  Diagnosis Date  . CAD (coronary artery disease)   . Cellulitis and abscess of lower extremity   . CHF (congestive heart failure) (HCC)    EF 30%  . Chronic kidney disease, stage III (moderate) (HCC)   . DM (diabetes mellitus) ([place])   . Recurrent Clostridium difficile diarrhea 09/27/2017    Medications:  Medications Prior to Admission  Medication Sig Dispense Refill Last Dose  . acetaminophen (TYLENOL) 325 MG tablet Take 975 mg by mouth at bedtime as needed for mild pain.    06/21/2017  . amiodarone (PACERONE) 200 MG tablet Take 1 tablet (200 mg total) by mouth 2 (two) times daily. 60 tablet 0 09/13/2017  . atorvastatin (LIPITOR) 40 MG tablet Take 40 mg by mouth at bedtime.    09/12/2017  . bismuth subsalicylate (PEPTO BISMOL) 262 MG/15ML suspension Take 30 mLs by mouth every 6 (six) hours as needed for indigestion.   09/12/2017  . bumetanide (BUMEX) 2 MG tablet Take 2 tablets (4 mg total) by mouth 2 (two) times daily. (Patient taking differently: Take 3 mg by mouth 2 (two) times daily. ) 120 tablet 5 09/10/2017  . calcium carbonate (TUMS - DOSED IN  MG ELEMENTAL CALCIUM) 500 MG chewable tablet Chew 1 tablet by mouth 4 (four) times daily as needed for indigestion or heartburn.    unknown  . ferrous sulfate 325 (65 FE) MG tablet Take 325 mg by mouth daily with breakfast.   09/13/2017  . fluticasone (FLONASE) 50 MCG/ACT nasal spray Place 1 spray into both nostrils daily as needed for allergies or rhinitis.   09/13/2017  . Hydrocortisone (GERHARDT'S BUTT CREAM) CREA Apply 1 application topically 2 (two) times daily. (Patient not taking: Reported on 04/08/2018)   Completed Course at Unknown time  . nitroGLYCERIN (NITROSTAT) 0.4 MG SL tablet Place 0.4 mg under the tongue every 5 (five) minutes as needed for chest pain.   unknown  . oxyCODONE (OXY IR/ROXICODONE) 5 MG immediate release tablet Take 1 tablet (5 mg total) by mouth every 4 (four) hours as needed for moderate pain. (Patient not taking: Reported on 03/26/2018) 30 tablet 0 Completed Course at Unknown time  . pantoprazole (PROTONIX) 40 MG tablet Take 40 mg by mouth daily.  3   . potassium chloride SA (K-DUR,KLOR-CON) 20 MEQ tablet Take 2 tablets (40 mEq total) by mouth 2 (two) times daily. (Patient not taking: Reported on 09/16/2017) 120 tablet 1 Not Taking at Unknown time  . sitaGLIPtin (JANUVIA) 100 MG tablet Take 100 mg by mouth daily.   09/13/2017  .  traZODone (DESYREL) 50 MG tablet Take 50 mg by mouth at bedtime.   09/13/2017  . vitamin B-12 (CYANOCOBALAMIN) 1000 MCG tablet Take 1,000 mcg by mouth daily.   09/13/2017  . warfarin (COUMADIN) 2 MG tablet Take 1 tablet (2 mg total) by mouth daily. (Patient taking differently: Take 4 mg by mouth See admin instructions. Take 2 tablets (4mg ) by mouth once daily with 1/2 1mg  tablet (0.5mg ) to equal 4.5mg  daily) 30 tablet 1 09/12/2017    Assessment: 72 yo man to start heparin for afib.  He was on coumadin PTA and INR >10.00. MD has ordered Vitamin K 5 mg IV x 1. Goal of Therapy:  Heparin level 0.3-0.7 units/ml Monitor platelets by anticoagulation  protocol: Yes   Plan:  Start heparin when INR has drifted down. Check PT/INR later today  Sejla Marzano [place] 04/06/2018,2:57 AM

## 2018-04-06 NOTE — Consult Note (Addendum)
PULMONARY / CRITICAL CARE MEDICINE   Name: Christian Garner MRN: [id number] DOB: 11-18-1945    ADMISSION DATE:  03/25/2018 CONSULTATION DATE:  6/26  REFERRING MD:  Dr. Hal Hope  CHIEF COMPLAINT:  Hypotension  HISTORY OF PRESENT ILLNESS:   72 year old male with PMH as below, which is significant for HFrEF, Atrial fibrillation on warfarin, CKD III (he did briefly require dialysis in early 2018), DM, and recurrent cellulitis.  He presented to [name] emergency department on April 05, 2018 with complaints of worsening bilateral lower extremity cellulitis and generalized weakness.  Cellulitis had apparently been worsening for about a week during which.  He had very poor p.o. intake.  In the 24 hours preceding presentation to the emergency department his weakness became profound.  In the emergency department he was found to be hypotensive which initially responded to fluid bolus.  Cellulitic appearing lower extremities were noted as well as potential infiltrate on chest x-ray.  Laboratory evaluation was significant for serum creatinine of 6.69, potassium 6.3, and metabolic acidosis on ABG.  He was started on empiric antibiotics and admitted to stepdown unit under the hospitalists.  He progressively became hypotensive once again and  care medicine was called.  PAST MEDICAL HISTORY :  He  has a past medical history of CAD (coronary artery disease), Cellulitis and abscess of lower extremity, CHF (congestive heart failure) ([place]), Chronic kidney disease, stage III (moderate) ([place]), DM (diabetes mellitus) ([place]), and Recurrent Clostridium difficile diarrhea (09/27/2017).  PAST SURGICAL HISTORY: He  has a past surgical history that includes Coronary angioplasty with stent; Hand surgery; IR US Guide Vasc Access Right (09/27/2017); IR Fluoro Guide CV Line Right (09/27/2017); and IR Removal Tun Cv Cath W/O FL (10/01/2017).  Allergies  Allergen Reactions  . Metolazone     Per pt family, pt gets delirious  and confused when taking metolazone    No current facility-administered medications on file prior to encounter.    Current Outpatient Medications on File Prior to Encounter  Medication Sig  . acetaminophen (TYLENOL) 325 MG tablet Take 975 mg by mouth at bedtime as needed for mild pain.   Marland Kitchen amiodarone (PACERONE) 200 MG tablet Take 1 tablet (200 mg total) by mouth 2 (two) times daily.  Marland Kitchen atorvastatin (LIPITOR) 40 MG tablet Take 40 mg by mouth at bedtime.   . bismuth subsalicylate (PEPTO BISMOL) 262 MG/15ML suspension Take 30 mLs by mouth every 6 (six) hours as needed for indigestion.  . bumetanide (BUMEX) 2 MG tablet Take 2 tablets (4 mg total) by mouth 2 (two) times daily. (Patient taking differently: Take 3 mg by mouth 2 (two) times daily. )  . calcium carbonate (TUMS - DOSED IN MG ELEMENTAL CALCIUM) 500 MG chewable tablet Chew 1 tablet by mouth 4 (four) times daily as needed for indigestion or heartburn.   . ferrous sulfate 325 (65 FE) MG tablet Take 325 mg by mouth daily with breakfast.  . fluticasone (FLONASE) 50 MCG/ACT nasal spray Place 1 spray into both nostrils daily as needed for allergies or rhinitis.  Marland Kitchen Hydrocortisone (GERHARDT'S BUTT CREAM) CREA Apply 1 application topically 2 (two) times daily. (Patient not taking: Reported on 03/22/2018)  . nitroGLYCERIN (NITROSTAT) 0.4 MG SL tablet Place 0.4 mg under the tongue every 5 (five) minutes as needed for chest pain.  Marland Kitchen oxyCODONE (OXY IR/ROXICODONE) 5 MG immediate release tablet Take 1 tablet (5 mg total) by mouth every 4 (four) hours as needed for moderate pain. (Patient not taking: Reported on  03/24/2018)  . pantoprazole (PROTONIX) 40 MG tablet Take 40 mg by mouth daily.  . potassium chloride SA (K-DUR,KLOR-CON) 20 MEQ tablet Take 2 tablets (40 mEq total) by mouth 2 (two) times daily. (Patient not taking: Reported on 09/16/2017)  . sitaGLIPtin (JANUVIA) 100 MG tablet Take 100 mg by mouth daily.  . traZODone (DESYREL) 50 MG tablet Take 50 mg  by mouth at bedtime.  . vitamin B-12 (CYANOCOBALAMIN) 1000 MCG tablet Take 1,000 mcg by mouth daily.  Marland Kitchen warfarin (COUMADIN) 2 MG tablet Take 1 tablet (2 mg total) by mouth daily. (Patient taking differently: Take 4 mg by mouth See admin instructions. Take 2 tablets (4mg ) by mouth once daily with 1/2 1mg  tablet (0.5mg ) to equal 4.5mg  daily)    FAMILY HISTORY:  His indicated that the status of his mother is unknown. He indicated that the status of his father is unknown. He indicated that the status of his sister is unknown.   SOCIAL HISTORY: He  reports that he has quit smoking. He has never used smokeless tobacco. He reports that he does not drink alcohol or use drugs.  REVIEW OF SYSTEMS:   10 point review of system taken, please see HPI for positives and negatives.  SUBJECTIVE:  72 year old male who is awake and alert and in no apparent distress.  VITAL SIGNS: BP (!) 96/58   Pulse (!) 51   Temp (!) 97.4 F (36.3 C) (Oral)   Resp 20   Ht 6\' 2"  (1.88 m)   Wt 112 kg (246 lb 14.6 oz)   SpO2 94%   BMI 31.70 kg/m   HEMODYNAMICS:    VENTILATOR SETTINGS:    INTAKE / OUTPUT: I/O last 3 completed shifts: In: 5379.9 [P.O.:100; I.V.:3017.5; IV Piggyback:2262.4] Out: -   PHYSICAL EXAMINATION: General: Elderly male who is awake alert follows commands Neuro: Awake follows commands, appears somewhat confused at times. HEENT: Pupils equal reactive to light, no JVD is appreciated, oral mucosa is dry. Cardiovascular: Heart sounds are regular ventricular rate is 56 Lungs: Decreased breath sounds in the bases Abdomen: Distended decreased bowel sounds Musculoskeletal: Intact Skin: Bilateral lower extremities with multiple open wounds 3+ edema from the knee down. Is reported to have a sacral decubitus is stage II.  LABS:  BMET Recent Labs  Lab 03/17/2018 1934 04/06/18 0121  NA 129* 135  K 6.3* 6.0*  CL 96* 103  CO2 19* 20*  BUN 127* 119*  CREATININE 6.69* 6.24*  GLUCOSE 149*  94    Electrolytes Recent Labs  Lab 03/26/2018 1934 04/06/18 0121  CALCIUM 8.0* 7.5*  PHOS  --  6.4*    CBC Recent Labs  Lab 04/02/2018 1934 04/06/18 0121  WBC 11.5* 12.3*  HGB 13.9 13.4  HCT 46.2 42.8  PLT 247 231    Coag's Recent Labs  Lab 04/06/18 0121  INR >10.00*    Sepsis Markers Recent Labs  Lab 03/25/2018 1941 04/06/18 0121 04/06/18 0523  LATICACIDVEN 1.67 2.1* 2.4*    ABG Recent Labs  Lab 04/10/2018 2251  PHART 7.290*  PCO2ART 33.5  PO2ART 70.0*    Liver Enzymes Recent Labs  Lab 04/02/2018 1934 04/06/18 0121  AST 21 19  ALT 25 26  ALKPHOS 109 99  BILITOT 0.6 0.7  ALBUMIN 2.8* 2.5*    Cardiac Enzymes Recent Labs  Lab 04/06/18 0121  TROPONINI 0.04*    Glucose Recent Labs  Lab 04/06/18 0128  GLUCAP 124*    Imaging Dg Chest Port 1 View  Result Date:  04/01/2018 CLINICAL DATA:  Weakness EXAM: PORTABLE CHEST 1 VIEW COMPARISON:  11/15/2017 chest radiograph FINDINGS: Cardiomegaly with calcific aortic atherosclerosis. There is increased bibasilar airspace opacity without large consolidation. No overt pulmonary edema. No pneumothorax or sizable pleural effusion. IMPRESSION: Cardiomegaly and calcific aortic atherosclerosis (ICD10-I70.0). Bibasilar airspace opacities may indicate atelectasis and/or infection. If the patient is able, an upright PA and lateral series might be helpful. Electronically Signed   By: Ulyses Jarred M.D.   On: 04/04/2018 20:20   Ct Renal Stone Study  Result Date: 04/06/2018 CLINICAL DATA:  72 year old male with renal failure. Patient presenting with weakness. EXAM: CT ABDOMEN AND PELVIS WITHOUT CONTRAST TECHNIQUE: Multidetector CT imaging of the abdomen and pelvis was performed following the standard protocol without IV contrast. COMPARISON:  CT of the abdomen pelvis dated 06/25/2017 FINDINGS: Evaluation of this exam is limited in the absence of intravenous contrast. Lower chest: Partially visualized moderate right pleural  effusion with associated partial compressive atelectasis of the right lower lobe. Pneumonia is not excluded. Clinical correlation is recommended. There is mild cardiomegaly. Coronary vascular calcification noted. No intra-abdominal free air. Small subhepatic ascites extending into the pelvis. Hepatobiliary: The liver is unremarkable. There is stone within the gallbladder. Mild thickened appearance of the gallbladder wall, likely related to ascites. Ultrasound may provide better evaluation of the gallbladder if there is high clinical concern for acute cholecystitis. Pancreas: Unremarkable. No pancreatic ductal dilatation or surrounding inflammatory changes. Spleen: Normal in size without focal abnormality. Adrenals/Urinary Tract: The adrenal glands are unremarkable. There is a duplicated right renal collecting system. There is no hydronephrosis or nephrolithiasis on either side. Mild renal parenchyma atrophy. Multiple right renal hypodense lesions are not well characterized on this unenhanced CT but appears similar to prior CT. These measure up to 2.5 cm in the interpolar aspect of the kidney. The visualized ureters and urinary bladder appear unremarkable. Stomach/Bowel: Mild thickened appearance of the colon although may be related to underdistention is concerning for colitis. Clinical correlation is recommended. No bowel obstruction. Normal appendix. Vascular/Lymphatic: Advanced aortoiliac atherosclerotic disease. No portal venous gas. There is no adenopathy. Reproductive: Mildly enlarged prostate gland with dystrophic calcification. Other: Small fat containing umbilical hernia as well as small fat containing bilateral inguinal hernias. Fatty atrophy of the musculature of the abdominal and pelvic wall. There is mild diffuse subcutaneous edema. Musculoskeletal: Degenerative changes of the spine. No acute osseous pathology. IMPRESSION: 1. Findings concerning for colitis. Correlation with clinical exam and stool  cultures recommended. No bowel obstruction. Normal appendix. 2. No hydronephrosis or nephrolithiasis. 3. Cholelithiasis. Ultrasound may provide better evaluation of the gallbladder if clinically indicated. 4. Moderate right pleural effusion with associated partial compressive atelectasis of the right lower lobe. There is a small ascites. 5.  Aortic Atherosclerosis (ICD10-I70.0). Electronically Signed   By: Anner Crete M.D.   On: 04/06/2018 01:07     STUDIES:    CULTURES: 625 urine culture>> 625 blood culture>>  ANTIBIOTICS: 6/25 vancomycin>> 6/25 Zyvox>> 6/25 Zosyn    SIGNIFICANT EVENTS: 625 admitted to [facility] with lower extremity edema and open weeping wounds with poor oral intake. LINES/TUBES:   DISCUSSION: Mr. Yohn is a 72 year old with a plethora of health issues.  He presents with increasing renal insufficiency with creatinine of 6, hyperkalemia along with bilateral lower extremity open wounds and poor vascular status.  He does have a history of C. difficile required a fecal transplant in the past and should not be on antibiotics according to previous records.  Currently is  on vancomycin, Zyvox, and Zosyn.  Due to period of hypotension which is improved with fluids, bradycardia , is on amiodarone, worsening renal failure suspected sepsis with elevated lactic acid.  Pulmonary critical care called to evaluate for possible transferred to the intensive care unit.  At this time is is adequate blood pressure, O2 saturations are adequate on room air.  He is awake and alert and seems to be improving with current interventions.  He may need dialysis here in the near future and if so he will need a hemodialysis catheter placed.  He is on 3 antibiotics for his lower extremity cellulitis and has a history of severe C. difficile colitis associated with antimicrobial therapy. 04/06/2018 believe the progressive care unit at this time should his condition deteriorate they will need to be  move the intensive care unit at that time.  ASSESSMENT / PLAN:  PULMONARY A: No acute issue History of a right pleural effusion with thoracentesis in 09/2017 for 1.4 L P:   O2 as needed CT reveals right pleural effusion question pneumonia would not recommend thoracentesis at this time with an INR greater than 10.   CARDIOVASCULAR A:  Hypotension: suspect hypovolemic in nature, also sepsis a possibility.  Chronic HFrEF last 2D echo revealing EF of 30% CAD Bradycardia P:  Hold amiodarone Gentle hydration If he does not respond to current therapy may need a central line monitor central venous pressure. Consider cardiology consult  RENAL Lab Results  Component Value Date   CREATININE 6.24 (H) 04/06/2018   CREATININE 6.69 (H) 04/06/2018   CREATININE 1.74 (H) 10/01/2017   Recent Labs  Lab 03/19/2018 1934 04/06/18 0121  K 6.3* 6.0*     A:   Acute on chronic renal failure Hyperkalemia Metabolic acidosis  P:   Discussed with nephrology overnight. Recommend hydration and close BMP monitoring.  May need hemodialysis in the near future if he does not respond to fluid challenge Suggest renal consult Continue with bicarbonate drip at this time. Recheck electrolytes  GASTROINTESTINAL A:   History of severe C. difficile colitis P:   Monitor for diarrhea  HEMATOLOGIC Lab Results  Component Value Date   INR >10.00 (HH) 04/06/2018   INR 2.29 10/01/2017   INR 2.25 09/30/2017    A:   Warfarin coagulopathy  P:  Hold Coumadin No anticoagulation until his INR gets less than 2 INR will make placing of any central line difficult at best.  INFECTIOUS A:   Cellulitis ? Pneumonia Evidence of colitis on CT P:   Currently on 3 antibiotics He is been seen by infectious disease in the past would recommend reconsult infectious disease. Check procal  ENDOCRINE CBG (last 3)  Recent Labs    04/06/18 0128 04/06/18 0736  GLUCAP 124* 130*   A:   Elevated  TSH DM  P:   T4 04/06/2018 we will stop amiodarone until heart rate is improved  NEUROLOGIC A:   Appears alert without distress P:   RASS goal: 1 Careful with any sedation  FAMILY  - Updates: 04/06/2018 0 800 patient updated at bedside  - Inter-disciplinary family meet or Palliative Care meeting due by:  day [place] ACNP Maryanna Shape PCCM Pager [phone] till 1 pm If no answer page [phone] 04/06/2018, 8:08 AM  Attending Note:  72 year old male with an extensive PMH to include renal failure, heart failure and atrial fibrillation who presents to the hospital with cellulitis.  Overnight, the patient becomes hypotensive and bradycardic.  PCCM was consulted for septic shock.  Upon evaluating the patient, she is complaining that he can not urinate but has responded nicely to IVF and is starting to make urine once foley catheter was placed with bibasilar crackles on exam.  I reviewed CXR myself, cardiomegaly and pulmonary edema noted.  Discussed with PCCM-NP.  Now that patient is making urine and BP is improving would strongly recommend decreasing IVF given CHF history.  Bradycardia was likely related to amiodarone and worsening renal function.  Hold all beta blockers and amiodarone, tele monitoring and monitor respiratory status.  At this point, patient will be transferred to the SDU for closer monitoring.  PCCM will sign off, please call back if needed.  The patient is critically ill with multiple organ systems failure and requires high complexity decision making for assessment and support, frequent evaluation and titration of therapies, application of advanced monitoring technologies and extensive interpretation of multiple databases.   Critical Care Time devoted to patient care services described in this note is  45  Minutes. This time reflects time of care of this signee Dr Jennet Maduro. This critical care time does not reflect procedure time, or teaching time or supervisory time of  PA/NP/Med student/Med Resident etc but could involve care discussion time.  Rush Farmer, M.D. [facility] Pulmonary/Critical Care Medicine. Pager: [phone]. After hours pager: [phone].

## 2018-04-06 NOTE — Progress Notes (Signed)
[facility] PROGRESS NOTE  Christian Garner AYT:[id number] DOB: [birth year] DOA: 03/23/2018  PCP: [name], MD  Brief History/Interval Summary: 72 year old Caucasian male with a past medical history of chronic systolic CHF EF known to be 30 to 35% based on echo done in June 2018, coronary artery disease status post PTCA, atrial fibrillation on Coumadin, chronic kidney disease stage III presented with worsening bilateral lower extremity swelling and cellulitis as well as generalized weakness.  Patient was noted to be hypotensive and given fluids in the ED.  Creatinine was noted to be markedly elevated at 6.69 with the hyperkalemia.  Nephrology was consulted.  Patient was hospitalized.  Reason for Visit: Acute kidney injury.  Bilateral lower extremity cellulitis.  Consultants: Nephrology.  Critical care medicine  Procedures: None yet  Antibiotics: Zosyn and linezolid  Subjective/Interval History: Patient noted to be confused denies any chest pain.  Denies any nausea vomiting.  Some shortness of breath.  ROS: No headaches  Objective:  Vital Signs  Vitals:   04/06/18 0350 04/06/18 0500 04/06/18 0803 04/06/18 1152  BP:   103/63 (!) 99/59  Pulse:   (!) 54 (!) 51  Resp:   19 20  Temp: (!) 97.4 F (36.3 C)  (!) 97.3 F (36.3 C) (!) 97.3 F (36.3 C)  TempSrc: Oral  Oral Oral  SpO2:   92% 92%  Weight:  112 kg (246 lb 14.6 oz)    Height:        Intake/Output Summary (Last 24 hours) at 04/06/2018 1217 Last data filed at 04/06/2018 1214 Gross per 24 hour  Intake 6259.85 ml  Output -  Net 6259.85 ml   Filed Weights   03/18/2018 1852 04/06/18 0500  Weight: 105.2 kg (232 lb) 112 kg (246 lb 14.6 oz)    General appearance: alert, cooperative, appears stated age and no distress Head: Normocephalic, without obvious abnormality, atraumatic Resp: Few crackles at the bases.  Normal effort at rest.  No use of accessory muscles.  No wheezing or rhonchi. Cardio: regular rate  and rhythm, S1, S2 normal, no murmur, click, rub or gallop GI: soft, non-tender; bowel sounds normal; no masses,  no organomegaly Extremities: Swelling of both lower extremities noted.  Erythematous.  Chronic skin changes.  Wounds noted anteriorly especially over the right lower extremity.  Clear liquid noted to be oozing.  Warm to touch. Neurologic: Distracted.  No obvious focal neurological deficits.  Lab Results:  Data Reviewed: I have personally reviewed following labs and imaging studies  CBC: Recent Labs  Lab 03/13/2018 1934 04/06/18 0121  WBC 11.5* 12.3*  NEUTROABS 9.5* 9.6*  HGB 13.9 13.4  HCT 46.2 42.8  MCV 98.9 97.5  PLT 247 355    Basic Metabolic Panel: Recent Labs  Lab 03/16/2018 1934 04/06/18 0121  NA 129* 135  K 6.3* 6.0*  CL 96* 103  CO2 19* 20*  GLUCOSE 149* 94  BUN 127* 119*  CREATININE 6.69* 6.24*  CALCIUM 8.0* 7.5*  PHOS  --  6.4*    GFR: Estimated Creatinine Clearance: 14.2 mL/min (A) (by C-G formula based on SCr of 6.24 mg/dL (H)).  Liver Function Tests: Recent Labs  Lab 04/03/2018 1934 04/06/18 0121  AST 21 19  ALT 25 26  ALKPHOS 109 99  BILITOT 0.6 0.7  PROT 6.8 6.3*  ALBUMIN 2.8* 2.5*    Recent Labs  Lab 04/07/2018 1934  LIPASE 47   Coagulation Profile: Recent Labs  Lab 04/06/18 0121 04/06/18 0734  INR >10.00* 3.37  Cardiac Enzymes: Recent Labs  Lab 04/06/18 0121  CKTOTAL 194  TROPONINI 0.04*    CBG: Recent Labs  Lab 04/06/18 0128 04/06/18 0736 04/06/18 1151  GLUCAP 124* 130* 168*    Thyroid Function Tests: Recent Labs    04/06/18 0121 04/06/18 0734  TSH 7.258*  --   FREET4  --  1.00     Recent Results (from the past 240 hour(s))  MRSA PCR Screening     Status: None   Collection Time: 04/06/18  2:16 AM  Result Value Ref Range Status   MRSA by PCR NEGATIVE NEGATIVE Final    Comment:        The GeneXpert MRSA Assay (FDA approved for NASAL specimens only), is one component of a comprehensive MRSA  colonization surveillance program. It is not intended to diagnose MRSA infection nor to guide or monitor treatment for MRSA infections. Performed at [facility], [place] [address], [place], [place] [address]       Radiology Studies: Dg Chest Port 1 View  Result Date: 04/01/2018 CLINICAL DATA:  Weakness EXAM: PORTABLE CHEST 1 VIEW COMPARISON:  11/15/2017 chest radiograph FINDINGS: Cardiomegaly with calcific aortic atherosclerosis. There is increased bibasilar airspace opacity without large consolidation. No overt pulmonary edema. No pneumothorax or sizable pleural effusion. IMPRESSION: Cardiomegaly and calcific aortic atherosclerosis (ICD10-I70.0). Bibasilar airspace opacities may indicate atelectasis and/or infection. If the patient is able, an upright PA and lateral series might be helpful. Electronically Signed   By: Ulyses Jarred M.D.   On: 03/13/2018 20:20   Ct Renal Stone Study  Result Date: 04/06/2018 CLINICAL DATA:  72 year old male with renal failure. Patient presenting with weakness. EXAM: CT ABDOMEN AND PELVIS WITHOUT CONTRAST TECHNIQUE: Multidetector CT imaging of the abdomen and pelvis was performed following the standard protocol without IV contrast. COMPARISON:  CT of the abdomen pelvis dated 06/25/2017 FINDINGS: Evaluation of this exam is limited in the absence of intravenous contrast. Lower chest: Partially visualized moderate right pleural effusion with associated partial compressive atelectasis of the right lower lobe. Pneumonia is not excluded. Clinical correlation is recommended. There is mild cardiomegaly. Coronary vascular calcification noted. No intra-abdominal free air. Small subhepatic ascites extending into the pelvis. Hepatobiliary: The liver is unremarkable. There is stone within the gallbladder. Mild thickened appearance of the gallbladder wall, likely related to ascites. Ultrasound may provide better evaluation of the gallbladder if there is high clinical concern  for acute cholecystitis. Pancreas: Unremarkable. No pancreatic ductal dilatation or surrounding inflammatory changes. Spleen: Normal in size without focal abnormality. Adrenals/Urinary Tract: The adrenal glands are unremarkable. There is a duplicated right renal collecting system. There is no hydronephrosis or nephrolithiasis on either side. Mild renal parenchyma atrophy. Multiple right renal hypodense lesions are not well characterized on this unenhanced CT but appears similar to prior CT. These measure up to 2.5 cm in the interpolar aspect of the kidney. The visualized ureters and urinary bladder appear unremarkable. Stomach/Bowel: Mild thickened appearance of the colon although may be related to underdistention is concerning for colitis. Clinical correlation is recommended. No bowel obstruction. Normal appendix. Vascular/Lymphatic: Advanced aortoiliac atherosclerotic disease. No portal venous gas. There is no adenopathy. Reproductive: Mildly enlarged prostate gland with dystrophic calcification. Other: Small fat containing umbilical hernia as well as small fat containing bilateral inguinal hernias. Fatty atrophy of the musculature of the abdominal and pelvic wall. There is mild diffuse subcutaneous edema. Musculoskeletal: Degenerative changes of the spine. No acute osseous pathology. IMPRESSION: 1. Findings concerning for colitis. Correlation with  clinical exam and stool cultures recommended. No bowel obstruction. Normal appendix. 2. No hydronephrosis or nephrolithiasis. 3. Cholelithiasis. Ultrasound may provide better evaluation of the gallbladder if clinically indicated. 4. Moderate right pleural effusion with associated partial compressive atelectasis of the right lower lobe. There is a small ascites. 5.  Aortic Atherosclerosis (ICD10-I70.0). Electronically Signed   By: Anner Crete M.D.   On: 04/06/2018 01:07     Medications:  Scheduled: . ferrous sulfate  325 mg Oral Q breakfast  . insulin aspart   0-9 Units Subcutaneous TID WC   Continuous: . sodium chloride    . linezolid (ZYVOX) IV    . piperacillin-tazobactam (ZOSYN)  IV 2.25 g (04/06/18 1214)  .  sodium bicarbonate (isotonic) infusion in sterile water 75 mL/hr at 04/06/18 1214   OZH:YQMVHQ chloride, acetaminophen **OR** acetaminophen  Assessment/Plan:    Acute on chronic kidney disease stage III It appears that patient has been on dialysis previously but only for a brief while.  This was approximately 1 year ago.  Patient's creatinine was 1.74 in December 2018.  Now markedly elevated at greater than 6.  No hydronephrosis noted on CT scan.  There is some evidence for volume overload on examination.  Patient was initially given IV fluids due to hypotension.  Blood pressure has stabilized.  Stop IV fluids.  Nephrology has been consulted.  Monitor urine output.  Patient will likely need hemodialysis.  Potassium level noted to be elevated and improved with Kayexalate.  Recheck labs later today.  Avoid nephrotoxic agents.  He was on Bumex at home which has been stopped.  He was also placed on bicarbonate infusion.  Stop due to concern for fluid overload.  Bicarbonate level is 20.  Bilateral lower extremity edema and cellulitis Patient has skin changes suggestive of chronic venous stasis.  It looks like he developed swelling recently now with some skin breakdown and possible cellulitis.  Continue with current antibiotics for now.  Follow-up on cultures.  Hypotension Blood pressure noted to be low.  Patient does not appear to be symptomatic.  Monitor closely.  Chronic systolic CHF Monitor volume status closely.  History of atrial fibrillation on anticoagulation with warfarin Supratherapeutic INR noted.  He was given vitamin K with improvement in INR.  Monitor on telemetry.  Continue amiodarone.  History of coronary artery disease Stable.  History of diabetes mellitus type 2 Follow CBGs.   Abnormal TSH TSH is noted to be 7.2.   Free T4 normal.  No known history of thyroid disease.  Abnormal CT scan Multiple incidental findings noted including cholelithiasis.  He appears to be asymptomatic.  CT scan also raised concern for colitis however patient does not have any GI symptoms currently.  DVT Prophylaxis: Therapeutic INR    Code Status: Full code Family Communication: No family at bedside Disposition Plan: Management as outlined above.  PT and OT eval when more stable.    LOS: 1 day   [facility] Pager [phone] 04/06/2018, 12:17 PM  If 7PM-7AM, please contact night-coverage at [email or web address], password [facility]

## 2018-04-06 NOTE — Progress Notes (Signed)
Pt's temp is 97.3 orally and BP is 89/57.  Pt is A&O x4. MD [place] Pink made aware. Will continue to assess.

## 2018-04-06 NOTE — Progress Notes (Signed)
CRITICAL VALUE ALERT  Critical Value:  INR > 10  Date & Time Notied:  04/06/18 0234  Provider Notified: Tylene Fantasia   Orders Received/Actions taken:   phytonadione (VITAMIN K) 5 mg in dextrose 5 % 50 mL IVPB Start: 04/06/18 0300, 5 mg, Intravenous, 50 mL/hr, Once

## 2018-04-06 NOTE — Consult Note (Addendum)
[facility] Nephrology Consultation Note  Requesting MD: Dr. Maryland Pink Reason for consult: AKI  HPI:  Christian Garner is a 72 y.o. male. With h/o atrial fibrillation on Coumadin, type 2 diabetes with foot ulcer, coronary artery disease status post stent, chronic systolic CHF with EF of 30 to 35%, CKD stage III with baseline serum creatinine level around 2, admitted with generalized weakness and worsening lower extremity cellulitis.  Patient was taking Bumex as prescribed however his weakness has worsened.  In the ER patient was hypotensive therefore treated with IV fluid bolus.  He was found to have serum creatinine level of 6.69, potassium 6.3, sodium 129, CO2 19.  Treated with sodium bicarbonate IV fluid.  The serum creatinine level trended down to 5.9, potassium remains 6.2 and CO2 improved to 22.  Patient was a started on empiric antibiotics for cellulitis.  ICU was consulted for hypotension despite IV fluid.  Nephrology was consulted for further evaluation. In 12/2016, patient required CRRT for acute kidney injury in the setting of CHF exacerbation,ACEI, Bactrim use.  The kidney function  improved on discharge.  During my evaluation, patient was concerned for his leg cellulitis and wound.  He denied nausea, vomiting, dysuria, urgency.  Denied decreased urine output.  No chest pain or shortness of breath.  Denied use of any NSAIDs or over-the-counter medication.  Creatinine, Ser  Date/Time Value Ref Range Status  04/06/2018 11:05 AM 5.95 (H) 0.61 - 1.24 mg/dL Final  04/06/2018 01:21 AM 6.24 (H) 0.61 - 1.24 mg/dL Final  03/19/2018 07:34 PM 6.69 (H) 0.61 - 1.24 mg/dL Final  10/01/2017 05:28 AM 1.74 (H) 0.61 - 1.24 mg/dL Final  09/30/2017 04:37 AM 1.83 (H) 0.61 - 1.24 mg/dL Final  09/29/2017 04:41 AM 2.26 (H) 0.61 - 1.24 mg/dL Final  09/28/2017 05:37 AM 2.69 (H) 0.61 - 1.24 mg/dL Final  09/27/2017 05:41 AM 2.87 (H) 0.61 - 1.24 mg/dL Final  09/26/2017 02:46 AM 3.28 (H) 0.61 -  1.24 mg/dL Final  09/25/2017 02:45 AM 3.61 (H) 0.61 - 1.24 mg/dL Final  09/24/2017 07:13 PM 3.79 (H) 0.61 - 1.24 mg/dL Final  09/24/2017 03:20 AM 3.59 (H) 0.61 - 1.24 mg/dL Final  09/23/2017 04:44 AM 3.24 (H) 0.61 - 1.24 mg/dL Final  09/22/2017 01:36 AM 3.53 (H) 0.61 - 1.24 mg/dL Final  09/21/2017 02:26 AM 3.55 (H) 0.61 - 1.24 mg/dL Final  09/20/2017 02:15 AM 3.69 (H) 0.61 - 1.24 mg/dL Final  09/19/2017 03:09 AM 3.90 (H) 0.61 - 1.24 mg/dL Final  09/18/2017 02:52 AM 4.28 (H) 0.61 - 1.24 mg/dL Final  09/17/2017 03:10 AM 4.67 (H) 0.61 - 1.24 mg/dL Final  09/16/2017 10:25 AM 4.46 (H) 0.61 - 1.24 mg/dL Final  06/12/2017 03:23 AM 1.47 (H) 0.61 - 1.24 mg/dL Final  06/11/2017 02:51 AM 1.69 (H) 0.61 - 1.24 mg/dL Final  06/10/2017 03:17 AM 1.88 (H) 0.61 - 1.24 mg/dL Final  06/09/2017 02:34 AM 2.31 (H) 0.61 - 1.24 mg/dL Final  06/08/2017 02:18 AM 2.74 (H) 0.61 - 1.24 mg/dL Final  06/07/2017 10:26 AM 3.20 (H) 0.61 - 1.24 mg/dL Final  04/12/2017 02:40 PM 1.54 (H) 0.61 - 1.24 mg/dL Final  04/02/2017 03:04 AM 1.58 (H) 0.61 - 1.24 mg/dL Final  04/01/2017 03:11 AM 1.55 (H) 0.61 - 1.24 mg/dL Final  03/31/2017 03:14 AM 1.35 (H) 0.61 - 1.24 mg/dL Final  03/30/2017 01:11 PM 1.28 (H) 0.61 - 1.24 mg/dL Final  03/30/2017 04:02 AM 1.30 (H) 0.61 - 1.24 mg/dL Final  03/29/2017 02:47 AM 1.25 (H) 0.61 -  1.24 mg/dL Final  03/28/2017 03:00 AM 1.38 (H) 0.61 - 1.24 mg/dL Final  03/27/2017 03:55 AM 1.47 (H) 0.61 - 1.24 mg/dL Final  03/27/2017 03:32 AM 1.51 (H) 0.61 - 1.24 mg/dL Final  03/26/2017 02:54 AM 1.29 (H) 0.61 - 1.24 mg/dL Final  03/25/2017 04:40 AM 1.28 (H) 0.61 - 1.24 mg/dL Final  03/24/2017 04:04 AM 1.33 (H) 0.61 - 1.24 mg/dL Final  03/23/2017 03:57 AM 1.53 (H) 0.61 - 1.24 mg/dL Final  03/22/2017 01:50 AM 1.61 (H) 0.61 - 1.24 mg/dL Final  03/21/2017 11:17 AM 1.93 (H) 0.61 - 1.24 mg/dL Final  03/21/2017 04:35 AM 2.06 (H) 0.61 - 1.24 mg/dL Final  03/20/2017 10:52 AM 1.89 (H) 0.61 - 1.24 mg/dL Final   03/20/2017 12:30 AM 2.07 (H) 0.61 - 1.24 mg/dL Final  01/05/2017 05:35 AM 1.29 (H) 0.61 - 1.24 mg/dL Final  01/04/2017 03:41 AM 1.26 (H) 0.61 - 1.24 mg/dL Final  01/03/2017 03:00 AM 1.28 (H) 0.61 - 1.24 mg/dL Final  01/02/2017 03:20 AM 1.41 (H) 0.61 - 1.24 mg/dL Final  01/02/2017 03:20 AM 1.44 (H) 0.61 - 1.24 mg/dL Final  01/01/2017 08:54 AM 1.68 (H) 0.61 - 1.24 mg/dL Final  01/01/2017 02:32 AM 1.67 (H) 0.61 - 1.24 mg/dL Final     PMHx:   Past Medical History:  Diagnosis Date  . CAD (coronary artery disease)   . Cellulitis and abscess of lower extremity   . CHF (congestive heart failure) (HCC)    EF 30%  . Chronic kidney disease, stage III (moderate) (HCC)   . DM (diabetes mellitus) ([place])   . Recurrent Clostridium difficile diarrhea 09/27/2017    Past Surgical History:  Procedure Laterality Date  . CORONARY ANGIOPLASTY WITH STENT PLACEMENT    . HAND SURGERY     At [facility], around 2014  . IR FLUORO GUIDE CV LINE RIGHT  09/27/2017  . IR REMOVAL TUN CV CATH W/O FL  10/01/2017  . IR US GUIDE VASC ACCESS RIGHT  09/27/2017    Family Hx:  Family History  Problem Relation Age of Onset  . Hypertension Mother   . Diabetes Mother   . Bone cancer Mother   . Hypertension Father   . Bladder Cancer Sister     Social History:  reports that he has quit smoking. He has never used smokeless tobacco. He reports that he does not drink alcohol or use drugs.  Allergies:  Allergies  Allergen Reactions  . Metolazone     Per pt family, pt gets delirious and confused when taking metolazone    Medications: Prior to Admission medications   Medication Sig Start Date End Date Taking? Authorizing Provider  acetaminophen (TYLENOL) 325 MG tablet Take 975 mg by mouth at bedtime as needed for mild pain.     [provider]  amiodarone (PACERONE) 200 MG tablet Take 1 tablet (200 mg total) by mouth 2 (two) times daily. 04/05/17   Allie Bossier, MD  atorvastatin (LIPITOR) 40 MG  tablet Take 40 mg by mouth at bedtime.     [provider]  bismuth subsalicylate (PEPTO BISMOL) 262 MG/15ML suspension Take 30 mLs by mouth every 6 (six) hours as needed for indigestion.    [provider]  bumetanide (BUMEX) 2 MG tablet Take 2 tablets (4 mg total) by mouth 2 (two) times daily. Patient taking differently: Take 3 mg by mouth 2 (two) times daily.  06/12/17   Tawny Asal, MD  calcium carbonate (TUMS - DOSED IN MG ELEMENTAL  CALCIUM) 500 MG chewable tablet Chew 1 tablet by mouth 4 (four) times daily as needed for indigestion or heartburn.     [provider]  ferrous sulfate 325 (65 FE) MG tablet Take 325 mg by mouth daily with breakfast.    [provider]  fluticasone (FLONASE) 50 MCG/ACT nasal spray Place 1 spray into both nostrils daily as needed for allergies or rhinitis.    [provider]  Hydrocortisone (GERHARDT'S BUTT CREAM) CREA Apply 1 application topically 2 (two) times daily. Patient not taking: Reported on 03/21/2018 10/01/17   Edwin Dada, MD  nitroGLYCERIN (NITROSTAT) 0.4 MG SL tablet Place 0.4 mg under the tongue every 5 (five) minutes as needed for chest pain.    [provider]  oxyCODONE (OXY IR/ROXICODONE) 5 MG immediate release tablet Take 1 tablet (5 mg total) by mouth every 4 (four) hours as needed for moderate pain. Patient not taking: Reported on 03/20/2018 10/01/17   Edwin Dada, MD  pantoprazole (PROTONIX) 40 MG tablet Take 40 mg by mouth daily. 02/01/18   [provider]  potassium chloride SA (K-DUR,KLOR-CON) 20 MEQ tablet Take 2 tablets (40 mEq total) by mouth 2 (two) times daily. Patient not taking: Reported on 09/16/2017 06/12/17   Tawny Asal, MD  sitaGLIPtin (JANUVIA) 100 MG tablet Take 100 mg by mouth daily.    [provider]  traZODone (DESYREL) 50 MG tablet Take 50 mg by mouth at bedtime.    [provider]  vitamin B-12 (CYANOCOBALAMIN) 1000 MCG  tablet Take 1,000 mcg by mouth daily.    [provider]  warfarin (COUMADIN) 2 MG tablet Take 1 tablet (2 mg total) by mouth daily. Patient taking differently: Take 4 mg by mouth See admin instructions. Take 2 tablets (43m) by mouth once daily with 1/2 1129mtablet (0.29m229mto equal 4.29mg78mily 06/12/17   HardTawny Asal    I have reviewed the patient's current medications.  Labs:  Results for orders placed or performed during the hospital encounter of 03/15/2018 (from the past 48 hour(s))  Comprehensive metabolic panel     Status: Abnormal   Collection Time: 03/13/2018  7:34 PM  Result Value Ref Range   Sodium 129 (L) 135 - 145 mmol/L   Potassium 6.3 (HH) 3.5 - 5.1 mmol/L    Comment: NO VISIBLE HEMOLYSIS CRITICAL RESULT CALLED TO, READ BACK BY AND VERIFIED WITH: A.GUIJOZA,RN [id number] 03/25/2018 M.CAMPBELL    Chloride 96 (L) 98 - 111 mmol/L    Comment: Please note change in reference range.   CO2 19 (L) 22 - 32 mmol/L   Glucose, Bld 149 (H) 70 - 99 mg/dL    Comment: Please note change in reference range.   BUN 127 (H) 8 - 23 mg/dL    Comment: Please note change in reference range.   Creatinine, Ser 6.69 (H) 0.61 - 1.24 mg/dL   Calcium 8.0 (L) 8.9 - 10.3 mg/dL   Total Protein 6.8 6.5 - 8.1 g/dL   Albumin 2.8 (L) 3.5 - 5.0 g/dL   AST 21 15 - 41 U/L   ALT 25 0 - 44 U/L    Comment: Please note change in reference range.   Alkaline Phosphatase 109 38 - 126 U/L   Total Bilirubin 0.6 0.3 - 1.2 mg/dL   GFR calc non Af Amer 7 (L) >60 mL/min   GFR calc Af Amer 9 (L) >60 mL/min    Comment: (NOTE) The eGFR has been calculated using the CKD  EPI equation. This calculation has not been validated in all clinical situations. eGFR's persistently <60 mL/min signify possible Chronic Kidney Disease.    Anion gap 14 5 - 15    Comment: Performed at [facility] [address] [place], [place] [address]  CBC WITH DIFFERENTIAL     Status: Abnormal   Collection Time: 03/26/2018  7:34 PM  Result  Value Ref Range   WBC 11.5 (H) 4.0 - 10.5 K/uL   RBC 4.67 4.22 - 5.81 MIL/uL   Hemoglobin 13.9 13.0 - 17.0 g/dL   HCT 46.2 39.0 - 52.0 %   MCV 98.9 78.0 - 100.0 fL   MCH 29.8 26.0 - 34.0 pg   MCHC 30.1 30.0 - 36.0 g/dL   RDW 17.1 (H) 11.5 - 15.5 %   Platelets 247 150 - 400 K/uL   Neutrophils Relative % 82 %   Neutro Abs 9.5 (H) 1.7 - 7.7 K/uL   Lymphocytes Relative 8 %   Lymphs Abs 0.9 0.7 - 4.0 K/uL   Monocytes Relative 8 %   Monocytes Absolute 0.9 0.1 - 1.0 K/uL   Eosinophils Relative 1 %   Eosinophils Absolute 0.1 0.0 - 0.7 K/uL   Basophils Relative 0 %   Basophils Absolute 0.0 0.0 - 0.1 K/uL   Immature Granulocytes 1 %   Abs Immature Granulocytes 0.1 0.0 - 0.1 K/uL    Comment: Performed at [facility] [address], [place], [place] [address]  Blood Culture (routine x 2)     Status: None (Preliminary result)   Collection Time: 04/03/2018  7:34 PM  Result Value Ref Range   Specimen Description BLOOD LEFT ANTECUBITAL    Special Requests      BOTTLES DRAWN AEROBIC AND ANAEROBIC Blood Culture results may not be optimal due to an inadequate volume of blood received in culture bottles   Culture      NO GROWTH < 24 HOURS Performed at [facility] [address] [place], [place] [address]    Report Status PENDING   Lipase, blood     Status: None   Collection Time: 03/19/2018  7:34 PM  Result Value Ref Range   Lipase 47 11 - 51 U/L    Comment: Performed at [facility] [address], [place], [place] [address]  I-Stat CG4 Lactic Acid, ED  (not at  [facility])     Status: None   Collection Time: 03/16/2018  7:41 PM  Result Value Ref Range   Lactic Acid, Venous 1.67 0.5 - 1.9 mmol/L  Blood Culture (routine x 2)     Status: None (Preliminary result)   Collection Time: 03/22/2018  7:49 PM  Result Value Ref Range   Specimen Description BLOOD RIGHT HAND    Special Requests      BOTTLES DRAWN AEROBIC ONLY Blood Culture results may not be optimal due to an inadequate  volume of blood received in culture bottles   Culture      NO GROWTH < 24 HOURS Performed at [facility] [address], [place], [place] [address]    Report Status PENDING   I-Stat arterial blood gas, ED     Status: Abnormal   Collection Time: 04/09/2018 10:51 PM  Result Value Ref Range   pH, Arterial 7.290 (L) 7.350 - 7.450   pCO2 arterial 33.5 32.0 - 48.0 mmHg   pO2, Arterial 70.0 (L) 83.0 - 108.0 mmHg   Bicarbonate 16.2 (L) 20.0 -  28.0 mmol/L   TCO2 17 (L) 22 - 32 mmol/L   O2 Saturation 92.0 %   Acid-base deficit 10.0 (H) 0.0 - 2.0 mmol/L   Patient temperature 97.7 F    Collection site RADIAL, ALLEN'S TEST ACCEPTABLE    Drawn by Operator    Sample type ARTERIAL   Urinalysis, Routine w reflex microscopic     Status: Abnormal   Collection Time: 03/31/2018 11:48 PM  Result Value Ref Range   Color, Urine AMBER (A) YELLOW    Comment: BIOCHEMICALS MAY BE AFFECTED BY COLOR   APPearance TURBID (A) CLEAR   Specific Gravity, Urine 1.013 1.005 - 1.030   pH 5.0 5.0 - 8.0   Glucose, UA NEGATIVE NEGATIVE mg/dL   Hgb urine dipstick MODERATE (A) NEGATIVE   Bilirubin Urine NEGATIVE NEGATIVE   Ketones, ur NEGATIVE NEGATIVE mg/dL   Protein, ur 100 (A) NEGATIVE mg/dL   Nitrite NEGATIVE NEGATIVE   Leukocytes, UA LARGE (A) NEGATIVE   RBC / HPF 21-50 0 - 5 RBC/hpf   WBC, UA >50 (H) 0 - 5 WBC/hpf   Bacteria, UA FEW (A) NONE SEEN   WBC Clumps PRESENT    Hyaline Casts, UA PRESENT     Comment: Performed at [facility], [address], [place], [place] [address]  Sodium, urine, random     Status: None   Collection Time: 04/07/2018 11:48 PM  Result Value Ref Range   Sodium, Ur 14 mmol/L    Comment: Performed at [facility] [address], [place], [place] [address]  Creatinine, urine, random     Status: None   Collection Time: 03/17/2018 11:48 PM  Result Value Ref Range   Creatinine, Urine 136.65 mg/dL    Comment: Performed at [facility] [address]  [place], [place] [address]  Basic metabolic panel     Status: Abnormal   Collection Time: 04/06/18  1:21 AM  Result Value Ref Range   Sodium 135 135 - 145 mmol/L   Potassium 6.0 (H) 3.5 - 5.1 mmol/L   Chloride 103 98 - 111 mmol/L    Comment: Please note change in reference range.   CO2 20 (L) 22 - 32 mmol/L   Glucose, Bld 94 70 - 99 mg/dL    Comment: Please note change in reference range.   BUN 119 (H) 8 - 23 mg/dL    Comment: Please note change in reference range.   Creatinine, Ser 6.24 (H) 0.61 - 1.24 mg/dL   Calcium 7.5 (L) 8.9 - 10.3 mg/dL   GFR calc non Af Amer 8 (L) >60 mL/min   GFR calc Af Amer 9 (L) >60 mL/min    Comment: (NOTE) The eGFR has been calculated using the CKD EPI equation. This calculation has not been validated in all clinical situations. eGFR's persistently <60 mL/min signify possible Chronic Kidney Disease.    Anion gap 12 5 - 15    Comment: Performed at [facility] [address], [place],  [address]  Hepatic function panel     Status: Abnormal   Collection Time: 04/06/18  1:21 AM  Result Value Ref Range   Total Protein 6.3 (L) 6.5 - 8.1 g/dL   Albumin 2.5 (L) 3.5 - 5.0 g/dL   AST 19 15 - 41 U/L   ALT 26 0 - 44 U/L    Comment: Please note change in reference range.   Alkaline Phosphatase 99 38 - 126 U/L   Total  Bilirubin 0.7 0.3 - 1.2 mg/dL   Bilirubin, Direct 0.2 0.0 - 0.2 mg/dL    Comment: Please note change in reference range.   Indirect Bilirubin 0.5 0.3 - 0.9 mg/dL    Comment: Performed at [facility], [place] [address] [place], [place] [address]  Phosphorus     Status: Abnormal   Collection Time: 04/06/18  1:21 AM  Result Value Ref Range   Phosphorus 6.4 (H) 2.5 - 4.6 mg/dL    Comment: Performed at [facility] [address] [place], [place] [address]  CBC WITH DIFFERENTIAL     Status: Abnormal   Collection Time: 04/06/18  1:21 AM  Result Value Ref Range   WBC 12.3 (H) 4.0 - 10.5 K/uL   RBC 4.39 4.22 - 5.81 MIL/uL    Hemoglobin 13.4 13.0 - 17.0 g/dL   HCT 42.8 39.0 - 52.0 %   MCV 97.5 78.0 - 100.0 fL   MCH 30.5 26.0 - 34.0 pg   MCHC 31.3 30.0 - 36.0 g/dL   RDW 17.1 (H) 11.5 - 15.5 %   Platelets 231 150 - 400 K/uL   Neutrophils Relative % 79 %   Neutro Abs 9.6 (H) 1.7 - 7.7 K/uL   Lymphocytes Relative 10 %   Lymphs Abs 1.3 0.7 - 4.0 K/uL   Monocytes Relative 10 %   Monocytes Absolute 1.2 (H) 0.1 - 1.0 K/uL   Eosinophils Relative 1 %   Eosinophils Absolute 0.1 0.0 - 0.7 K/uL   Basophils Relative 0 %   Basophils Absolute 0.0 0.0 - 0.1 K/uL   Immature Granulocytes 0 %   Abs Immature Granulocytes 0.1 0.0 - 0.1 K/uL    Comment: Performed at [facility], [place] [address], [place], [place] [address]  Type and screen [place]     Status: None   Collection Time: 04/06/18  1:21 AM  Result Value Ref Range   ABO/RH(D) A NEG    Antibody Screen NEG    Sample Expiration      04/09/2018 Performed at [facility], [place] [address] [place], [place] [address]   CK     Status: None   Collection Time: 04/06/18  1:21 AM  Result Value Ref Range   Total CK 194 49 - 397 U/L    Comment: Performed at [facility], [place] [address] [place], [place] [address]  TSH     Status: Abnormal   Collection Time: 04/06/18  1:21 AM  Result Value Ref Range   TSH 7.258 (H) 0.350 - 4.500 uIU/mL    Comment: Performed by a 3rd Generation assay with a functional sensitivity of <=0.01 uIU/mL. Performed at [facility], [place] [address] [place], [place] [address]   Troponin I     Status: Abnormal   Collection Time: 04/06/18  1:21 AM  Result Value Ref Range   Troponin I 0.04 (HH) <0.03 ng/mL    Comment: CRITICAL RESULT CALLED TO, READ BACK BY AND VERIFIED WITH: Q.LOFTIN,RN 0235 04/06/18 M.CAMPBELL Performed at [facility], [place] [address] [place], [place] [address]   Lactic acid, plasma     Status: Abnormal   Collection Time: 04/06/18  1:21 AM  Result Value Ref Range   Lactic  Acid, Venous 2.1 (HH) 0.5 - 1.9 mmol/L    Comment: CRITICAL RESULT CALLED TO, READ BACK BY AND VERIFIED WITH: Q.LOFTIN,RN 4709 04/06/18 M.CAMPBELL Performed at [facility], [address] [place], [place] [address]   Protime-INR     Status: Abnormal   Collection Time: 04/06/18  1:21 AM  Result Value Ref Range   Prothrombin Time >90.0 (H) 11.4 - 15.2 seconds    Comment: REPEATED TO VERIFY   INR >10.00 (HH)     Comment: REPEATED TO VERIFY CRITICAL RESULT CALLED TO, READ BACK BY AND VERIFIED WITH: Wilmon Arms 04/06/2018 Mena Goes Performed at [facility], [place] [address] [place], [place] [address]   Glucose, capillary     Status: Abnormal   Collection Time: 04/06/18  1:28 AM  Result Value Ref Range   Glucose-Capillary 124 (H) 70 - 99 mg/dL  MRSA PCR Screening     Status: None   Collection Time: 04/06/18  2:16 AM  Result Value Ref Range   MRSA by PCR NEGATIVE NEGATIVE    Comment:        The GeneXpert MRSA Assay (FDA approved for NASAL specimens only), is one component of a comprehensive MRSA colonization surveillance program. It is not intended to diagnose MRSA infection nor to guide or monitor treatment for MRSA infections. Performed at [facility], [place] [address] [place], [place] [address]   Lactic acid, plasma     Status: Abnormal   Collection Time: 04/06/18  5:23 AM  Result Value Ref Range   Lactic Acid, Venous 2.4 (HH) 0.5 - 1.9 mmol/L    Comment: CRITICAL RESULT CALLED TO, READ BACK BY AND VERIFIED WITH: K.LEE,RN 0263 04/06/18 M.CAMPBELL Performed at [facility], [place] [address] [place], [place] [address]   T4, free     Status: None   Collection Time: 04/06/18  7:34 AM  Result Value Ref Range   Free T4 1.00 0.82 - 1.77 ng/dL    Comment: (NOTE) Biotin ingestion may interfere with free T4 tests. If the results are inconsistent with the TSH level, previous test results, or the clinical presentation, then consider biotin  interference. If needed, order repeat testing after stopping biotin. Performed at [facility], [place] [address] [place],  [address]   Protime-INR     Status: Abnormal   Collection Time: 04/06/18  7:34 AM  Result Value Ref Range   Prothrombin Time 33.9 (H) 11.4 - 15.2 seconds   INR 3.37     Comment: Performed at [facility] [address], [place], [place] [address]  Glucose, capillary     Status: Abnormal   Collection Time: 04/06/18  7:36 AM  Result Value Ref Range   Glucose-Capillary 130 (H) 70 - 99 mg/dL  Basic metabolic panel     Status: Abnormal   Collection Time: 04/06/18 11:05 AM  Result Value Ref Range   Sodium 131 (L) 135 - 145 mmol/L   Potassium 6.2 (H) 3.5 - 5.1 mmol/L   Chloride 100 98 - 111 mmol/L    Comment: Please note change in reference range.   CO2 22 22 - 32 mmol/L   Glucose, Bld 160 (H) 70 - 99 mg/dL    Comment: Please note change in reference range.   BUN 114 (H) 8 - 23 mg/dL    Comment: Please note change in reference range.   Creatinine, Ser 5.95 (H) 0.61 - 1.24 mg/dL   Calcium 7.4 (L) 8.9 - 10.3 mg/dL   GFR calc non Af Amer 8 (L) >60 mL/min   GFR calc Af Amer 10 (L) >60 mL/min    Comment: (NOTE) The eGFR  has been calculated using the CKD EPI equation. This calculation has not been validated in all clinical situations. eGFR's persistently <60 mL/min signify possible Chronic Kidney Disease.    Anion gap 9 5 - 15    Comment: Performed at [facility] [address] [place], [place] [address]  Glucose, capillary     Status: Abnormal   Collection Time: 04/06/18 11:51 AM  Result Value Ref Range   Glucose-Capillary 168 (H) 70 - 99 mg/dL     ROS:  Pertinent items noted in HPI and remainder of comprehensive ROS otherwise negative.  Physical Exam: Vitals:   04/06/18 0803 04/06/18 1152  BP: 103/63 (!) 99/59  Pulse: (!) 54 (!) 51  Resp: 19 20  Temp: (!) 97.3 F (36.3 C) (!) 97.3 F (36.3 C)  SpO2: 92% 92%     General  exam: Appears calm and comfortable  Respiratory system: Clear to auscultation. Respiratory effort normal. No wheezing or crackle Cardiovascular system: S1 & S2 heard, RRR. No rub Gastrointestinal system: Abdomen is nondistended, soft and nontender. Normal bowel sounds heard. Central nervous system: Alert and oriented. No focal neurological deficits. Extremities: Bilateral lower extremity redness and a wound consistent with cellulitis. Edema+ Skin: leg pruritis rash Psychiatry: Judgement and insight appear normal. Mood & affect appropriate.   Assessment/Plan:  #Acute kidney injury on CKD stage III: Multifactorial etiology including hypotension, CHF with reduced renal perfusion and infection ?sepsis.  The urinalysis with protein, RBC, WBC and bacteria.  I will quantify urine protein creatinine ratio, repeat UA.  The CT scan of abdomen rule out obstruction and consistent with parenchymal disease. -Insert Foley catheter for strict ins and out, order ultrasound kidneys to evaluate for echogenicity and kidney size. -Patient had AKI in 12/2016 requiring CRRT when complements, anti-GBM, ANCA were negative. -Reported decreased urine output despite of IV fluid in the ER.  I will order a dose of Lasix.  Strict ins and outs, monitor BMP.  Avoid any nephrotoxins.  Patient may need dialysis if kidney function is worsened or with reduced urine output. -monitor Vanco level.  #Hyperkalemia: In the setting of renal failure.  Potassium chloride is listed as home medication.  Repeat potassium level is 6.2.  I will order V[place]assa.  IV Lasix would help to excrete potassium.  Repeat lab in the evening.  #Metabolic acidosis: CO2 level improved to 22.  Off sodium bicarbonate drip.  #History of CHF: Patient has leg edema which could be contributed by cellulitis.  Chest x-ray with no pulmonary edema.  Check BNP level.  # Hypotension: due to infection. PCCM following, may need pressors.  Thank you for the consult.  I  will continue to follow with you. Discussed with the primary team.  Joline Encalada Tanna Furry 04/06/2018, 2:03 PM  [place].

## 2018-04-06 NOTE — Progress Notes (Signed)
CRITICAL VALUE ALERT  Critical Value:  Lactic Acid: 2.1  Date & Time Notied: 04/06/18 0214  Provider Notified: Tylene Fantasia  Orders Received/Actions taken: 250 mL bolus ordered

## 2018-04-06 NOTE — Progress Notes (Signed)
CRITICAL VALUE ALERT  Critical Value:  Troponin 0.04   Date & Time Notied:  04/06/18 0235  Provider Notified: Tylene Fantasia   Orders Received/Actions taken: No new orders received.  Will continue to monitor the patient

## 2018-04-06 NOTE — Progress Notes (Signed)
Patient complaining of shortness of breath.  Placed the patient on 3 liters of oxygen due to O2 sats dropping in low 80"s.   Vitals: BP 96/60. HR: 46.  Resp: 26.  94% on 3LPM.  Lung sounds diminished bilaterally.  Notified triad  NP K. Baltazar Najjar.  Will continue to monitor the patient

## 2018-04-06 NOTE — Progress Notes (Signed)
Per patient he voided in the ED.  Patient was bladder scanned by nurse tech.  0 mL noted.  Will continue to monitor the patient and notify as needed

## 2018-04-07 ENCOUNTER — Inpatient Hospital Stay (HOSPITAL_COMMUNITY): Payer: Medicare Other

## 2018-04-07 DIAGNOSIS — I5022 Chronic systolic (congestive) heart failure: Secondary | ICD-10-CM

## 2018-04-07 LAB — RENAL FUNCTION PANEL
ALBUMIN: 2.5 g/dL — AB (ref 3.5–5.0)
ANION GAP: 11 (ref 5–15)
Albumin: 2.5 g/dL — ABNORMAL LOW (ref 3.5–5.0)
Anion gap: 13 (ref 5–15)
BUN: 116 mg/dL — AB (ref 8–23)
BUN: 119 mg/dL — AB (ref 8–23)
CHLORIDE: 97 mmol/L — AB (ref 98–111)
CO2: 24 mmol/L (ref 22–32)
CO2: 22 mmol/L (ref 22–32)
Calcium: 8.1 mg/dL — ABNORMAL LOW (ref 8.9–10.3)
Calcium: 8 mg/dL — ABNORMAL LOW (ref 8.9–10.3)
Chloride: 97 mmol/L — ABNORMAL LOW (ref 98–111)
Creatinine, Ser: 6.51 mg/dL — ABNORMAL HIGH (ref 0.61–1.24)
Creatinine, Ser: 6.44 mg/dL — ABNORMAL HIGH (ref 0.61–1.24)
GFR calc Af Amer: 9 mL/min — ABNORMAL LOW (ref >60)
GFR calc Af Amer: 9 mL/min — ABNORMAL LOW (ref >60)
GFR calc non Af Amer: 8 mL/min — ABNORMAL LOW (ref >60)
GFR, EST NON AFRICAN AMERICAN: 8 mL/min — AB (ref >60)
GLUCOSE: 127 mg/dL — AB (ref 70–99)
Glucose, Bld: 118 mg/dL — ABNORMAL HIGH (ref 70–99)
PHOSPHORUS: 6.7 mg/dL — AB (ref 2.5–4.6)
POTASSIUM: 6.8 mmol/L — AB (ref 3.5–5.1)
POTASSIUM: 6.2 mmol/L — AB (ref 3.5–5.1)
Phosphorus: 6.5 mg/dL — ABNORMAL HIGH (ref 2.5–4.6)
Sodium: 132 mmol/L — ABNORMAL LOW (ref 135–145)
Sodium: 132 mmol/L — ABNORMAL LOW (ref 135–145)

## 2018-04-07 LAB — CBC WITH DIFFERENTIAL/PLATELET
Abs Immature Granulocytes: 0.1 10*3/uL (ref 0.0–0.1)
BASOS ABS: 0 10*3/uL (ref 0.0–0.1)
BASOS PCT: 0 %
EOS PCT: 1 %
Eosinophils Absolute: 0.1 10*3/uL (ref 0.0–0.7)
HCT: 43.3 % (ref 39.0–52.0)
HEMOGLOBIN: 13.6 g/dL (ref 13.0–17.0)
Immature Granulocytes: 1 %
Lymphocytes Relative: 10 %
Lymphs Abs: 1.1 10*3/uL (ref 0.7–4.0)
MCH: 30.4 pg (ref 26.0–34.0)
MCHC: 31.4 g/dL (ref 30.0–36.0)
MCV: 96.9 fL (ref 78.0–100.0)
MONO ABS: 1 10*3/uL (ref 0.1–1.0)
MONOS PCT: 8 %
Neutro Abs: 9.8 10*3/uL — ABNORMAL HIGH (ref 1.7–7.7)
Neutrophils Relative %: 80 %
PLATELETS: 245 10*3/uL (ref 150–400)
RBC: 4.47 MIL/uL (ref 4.22–5.81)
RDW: 16.8 % — ABNORMAL HIGH (ref 11.5–15.5)
WBC: 12 10*3/uL — ABNORMAL HIGH (ref 4.0–10.5)

## 2018-04-07 LAB — GLUCOSE, CAPILLARY
GLUCOSE-CAPILLARY: 136 mg/dL — AB (ref 70–99)
Glucose-Capillary: 126 mg/dL — ABNORMAL HIGH (ref 70–99)
Glucose-Capillary: 124 mg/dL — ABNORMAL HIGH (ref 70–99)
Glucose-Capillary: 165 mg/dL — ABNORMAL HIGH (ref 70–99)

## 2018-04-07 LAB — PROTIME-INR
INR: 1.62
PROTHROMBIN TIME: 19.1 s — AB (ref 11.4–15.2)

## 2018-04-07 LAB — PROCALCITONIN: Procalcitonin: 0.26 ng/mL

## 2018-04-07 LAB — MAGNESIUM: MAGNESIUM: 3.2 mg/dL — AB (ref 1.7–2.4)

## 2018-04-07 LAB — HEPARIN LEVEL (UNFRACTIONATED): HEPARIN UNFRACTIONATED: 0.41 [IU]/mL (ref 0.30–0.70)

## 2018-04-07 MED ORDER — HEPARIN (PORCINE) IN NACL 100-0.45 UNIT/ML-% IJ SOLN
1400.0000 [IU]/h | INTRAMUSCULAR | Status: DC
  Administered 2018-04-07 – 2018-04-11 (×5): 1400 [IU]/h via INTRAVENOUS
  Filled 2018-04-07 (×6): qty 250

## 2018-04-07 MED ORDER — ONDANSETRON HCL 4 MG/2ML IJ SOLN
4.0000 mg | Freq: Four times a day (QID) | INTRAMUSCULAR | Status: DC | PRN
  Administered 2018-04-07 – 2018-04-11 (×4): 4 mg via INTRAVENOUS
  Filled 2018-04-07 (×5): qty 2

## 2018-04-07 MED ORDER — SODIUM POLYSTYRENE SULFONATE 15 GM/60ML PO SUSP
45.0000 g | Freq: Once | ORAL | Status: AC
  Administered 2018-04-07: 45 g via ORAL
  Filled 2018-04-07 (×2): qty 180

## 2018-04-07 MED ORDER — INSULIN ASPART 100 UNIT/ML ~~LOC~~ SOLN
10.0000 [IU] | Freq: Once | SUBCUTANEOUS | Status: AC
  Administered 2018-04-07: 10 [IU] via INTRAVENOUS

## 2018-04-07 MED ORDER — FUROSEMIDE 10 MG/ML IJ SOLN
80.0000 mg | Freq: Three times a day (TID) | INTRAMUSCULAR | Status: DC
  Administered 2018-04-07 – 2018-04-08 (×5): 80 mg via INTRAVENOUS
  Filled 2018-04-07 (×5): qty 8

## 2018-04-07 MED ORDER — DEXTROSE 50 % IV SOLN
1.0000 | Freq: Once | INTRAVENOUS | Status: AC
  Administered 2018-04-07: 50 mL via INTRAVENOUS
  Filled 2018-04-07: qty 50

## 2018-04-07 MED ORDER — NEPRO/CARBSTEADY PO LIQD
237.0000 mL | Freq: Two times a day (BID) | ORAL | Status: DC
  Administered 2018-04-07: 200 mL via ORAL
  Administered 2018-04-08 – 2018-04-11 (×3): 237 mL via ORAL

## 2018-04-07 MED ORDER — ADULT MULTIVITAMIN W/MINERALS CH
1.0000 | ORAL_TABLET | Freq: Every day | ORAL | Status: DC
  Administered 2018-04-07 – 2018-04-11 (×5): 1 via ORAL
  Filled 2018-04-07 (×5): qty 1

## 2018-04-07 MED ORDER — PATIROMER SORBITEX CALCIUM 8.4 G PO PACK
16.8000 g | PACK | Freq: Once | ORAL | Status: DC
  Filled 2018-04-07: qty 2

## 2018-04-07 MED ORDER — SODIUM POLYSTYRENE SULFONATE 15 GM/60ML PO SUSP: 60.0000 g | Freq: Once | ORAL | Status: DC

## 2018-04-07 MED ORDER — PATIROMER SORBITEX CALCIUM 8.4 G PO PACK
16.8000 g | PACK | Freq: Every day | ORAL | Status: DC
  Administered 2018-04-07 – 2018-04-11 (×5): 16.8 g via ORAL
  Filled 2018-04-07 (×5): qty 2

## 2018-04-07 NOTE — Progress Notes (Signed)
Rounding on patient to follow up with RRT. Patient appears mildly dyspneic, c/o increases dyspnea with exertion. Vital signs stable. Lung fields diminished, though no adventitious breath sounds auscultated. Markedly diminished urine production noted. Bladder scan done at bedside shows bladder emptying appropriately. Patient appears to be in sinus bradycardia with first degree AVB and BBB on monitor. After reviewing chart, discussed with bedside RN clinical implications. Recommend updating patient attending physician for recommendations. Will continue to follow prn.

## 2018-04-07 NOTE — Progress Notes (Signed)
ANTICOAGULATION CONSULT NOTE - Follow Up Consult  Pharmacy Consult for heparin Indication: atrial fibrillation  Labs: Recent Labs    03/19/2018 1934 04/06/18 0121 04/06/18 0734 04/06/18 1105 04/06/18 2226  HGB 13.9 13.4  --   --   --   HCT 46.2 42.8  --   --   --   PLT 247 231  --   --   --   LABPROT  --  >90.0* 33.9*  --  19.7*  INR  --  >10.00* 3.37  --  1.69  CREATININE 6.69* 6.24*  --  5.95*  --   CKTOTAL  --  194  --   --   --   TROPONINI  --  0.04*  --   --   --     Assessment: 72yo male now w/ INR <2 after dose of vit K, to start heparin.  Goal of Therapy:  Heparin level 0.3-0.7 units/ml  Monitor platelets by anticoagulation protocol: Yes   Plan:  Will start heparin gtt at 1400 units/hr and check level in 8 hours.    Wynona Neat, PharmD, BCPS  04/07/2018,12:52 AM

## 2018-04-07 NOTE — Progress Notes (Signed)
ANTICOAGULATION CONSULT NOTE - Follow Up Consult  Pharmacy Consult for Heparin Indication: atrial fibrillation  Allergies  Allergen Reactions  . Metolazone     Per pt family, pt gets delirious and confused when taking metolazone    Patient Measurements: Height: 6\' 2"  (188 cm) Weight: 254 lb 3.1 oz (115.3 kg) IBW/kg (Calculated) : 82.2 Heparin Dosing Weight:    Vital Signs: Temp: 98 F (36.7 C) (06/27 0745) Temp Source: Oral (06/27 0745) BP: 102/54 (06/27 0910) Pulse Rate: 60 (06/27 0910)  Labs: Recent Labs    04/09/2018 1934  04/06/18 0121 04/06/18 0734 04/06/18 1105 04/06/18 2226 04/07/18 0645 04/07/18 0959  HGB 13.9  --  13.4  --   --   --  13.6  --   HCT 46.2  --  42.8  --   --   --  43.3  --   PLT 247  --  231  --   --   --  245  --   LABPROT  --    < > >90.0* 33.9*  --  19.7* 19.1*  --   INR  --    < > >10.00* 3.37  --  1.69 1.62  --   HEPARINUNFRC  --   --   --   --   --   --   --  0.41  CREATININE 6.69*  --  6.24*  --  5.95*  --  6.51*  --   CKTOTAL  --   --  194  --   --   --   --   --   TROPONINI  --   --  0.04*  --   --   --   --   --    < > = values in this interval not displayed.    Estimated Creatinine Clearance: 13.8 mL/min (A) (by C-G formula based on SCr of 6.51 mg/dL (H)).  Assessment:  Anticoag: Afib, INR >10, start heparin when INR drifts down. Vit K 5mg  IV 6/26.  - INR 1.62 this AM. Heparin level 0.41, CBC WNL and stable.  Goal of Therapy:  Heparin level 0.3-0.7 units/ml Monitor platelets by anticoagulation protocol: Yes   Plan:  IV heparin 1400 units/hr Daily HL and CBC  Jimia Gentles S. Alford Highland, PharmD, BCPS Clinical Staff Pharmacist Pager [phone]  Eilene Ghazi Stillinger 04/07/2018,11:00 AM

## 2018-04-07 NOTE — Progress Notes (Signed)
[facility] NEPHROLOGY PROGRESS NOTE  Assessment/ Plan: Pt is a 72 y.o. yo male A. fib on Coumadin, type 2 diabetes with foot ulcer, coronary artery disease status post stent, CHF with EF of 30 to 35%, CKD 3 with baseline serum creatinine around 2 admitted with generalized weakness and worsening lower extremity cellulitis.  Consulted for hyperkalemia and worsening renal failure.  Assessment/Plan:  #Acute kidney injury on CKD stage III: Multifactorial etiology including hypotension, CHF with reduced renal perfusion and infection ?sepsis.  The urinalysis with protein, RBC, WBC and bacteria. Urine PCR 0.9.  Ultrasound kidney with bilateral renal cortical thinning. Received a dose of Lasix yesterday with urine output of around 680 cc in 24 hours.  On exam he has fluid overload with elevated BNP.  I will start Lasix 80 IV 3 times a day, strict ins and out, continue Foley and monitor renal function. -Avoid nephrotoxins -Patient had AKI in 12/2016 requiring CRRT when complements, anti-GBM, ANCA were negative. I discussed with the patient regarding worsening renal failure.  He understand that he may need dialysis if his kidney function continued to worsen.  #Hyperkalemia: Due to renal failure and was on potassium chloride at home.  Serum potassium level worsened to 6.8 today.  Continue Veltassa.  The higher dose of IV Lasix should help to excrete potassium.  Also receiving dextrose and insulin by primary team.  Repeat lab in the afternoon.    #Metabolic acidosis: improved.  #History of CHF with elevated BNP level.  On Lasix.  # Hypotension: due to infection.  Started midodrine yesterday.  Monitor blood pressure.  Subjective: Seen and examined at bedside.  Denied nausea, vomiting, chest pain, shortness of breath. Objective Vital signs in last 24 hours: Vitals:   04/07/18 0347 04/07/18 0518 04/07/18 0745 04/07/18 0910  BP:  102/65 103/70 (!) 102/54  Pulse:  (!) 54 (!) 105 60   Resp:  18 17 20   Temp: (!) 97.4 F (36.3 C)  98 F (36.7 C)   TempSrc: Oral  Oral   SpO2:  92% 99% 94%  Weight:  115.3 kg (254 lb 3.1 oz)    Height:       Weight change: 10.1 kg (22 lb 3.1 oz)  Intake/Output Summary (Last 24 hours) at 04/07/2018 1032 Last data filed at 04/07/2018 0742 Gross per 24 hour  Intake 1321.38 ml  Output 525 ml  Net 796.38 ml       Labs: Basic Metabolic Panel: Recent Labs  Lab 04/06/18 0121 04/06/18 1105 04/07/18 0645  NA 135 131* 132*  K 6.0* 6.2* 6.8*  CL 103 100 97*  CO2 20* 22 24  GLUCOSE 94 160* 118*  BUN 119* 114* 116*  CREATININE 6.24* 5.95* 6.51*  CALCIUM 7.5* 7.4* 8.1*  PHOS 6.4*  --  6.5*   Liver Function Tests: Recent Labs  Lab 04/01/2018 1934 04/06/18 0121 04/07/18 0645  AST 21 19  --   ALT 25 26  --   ALKPHOS 109 99  --   BILITOT 0.6 0.7  --   PROT 6.8 6.3*  --   ALBUMIN 2.8* 2.5* 2.5*   Recent Labs  Lab 03/31/2018 1934  LIPASE 47   No results for input(s): AMMONIA in the last 168 hours. CBC: Recent Labs  Lab 03/22/2018 1934 04/06/18 0121 04/07/18 0645  WBC 11.5* 12.3* 12.0*  NEUTROABS 9.5* 9.6* 9.8*  HGB 13.9 13.4 13.6  HCT 46.2 42.8 43.3  MCV 98.9 97.5 96.9  PLT 247 231 245  Cardiac Enzymes: Recent Labs  Lab 04/06/18 0121  CKTOTAL 194  TROPONINI 0.04*   CBG: Recent Labs  Lab 04/06/18 0736 04/06/18 1151 04/06/18 1721 04/06/18 2040 04/07/18 0750  GLUCAP 130* 168* 157* 149* 126*    Iron Studies: No results for input(s): IRON, TIBC, TRANSFERRIN, FERRITIN in the last 72 hours. Studies/Results: US Renal  Result Date: 04/06/2018 CLINICAL DATA:  Acute kidney injury. EXAM: RENAL / URINARY TRACT ULTRASOUND COMPLETE COMPARISON:  CT abdomen pelvis from same day. FINDINGS: Right Kidney: Length: 14.2. Duplicated collecting system. Echogenicity within normal limits. Mild cortical thinning. No mass or hydronephrosis visualized. 2.2 cm simple cyst in the lower pole. Left Kidney: Length: 12.0 cm.  Echogenicity within normal limits. Mild cortical thickening. No mass or hydronephrosis visualized. Bladder: Decompressed by Foley catheter. Small ascites. IMPRESSION: 1. No acute abnormality.  Mild bilateral renal cortical thinning. 2. Small ascites. Electronically Signed   By: Titus Dubin M.D.   On: 04/06/2018 18:55   Dg Chest Port 1 View  Result Date: 04/07/2018 CLINICAL DATA:  72 year old male with respiratory failure and shortness of breath. EXAM: PORTABLE CHEST 1 VIEW COMPARISON:  Chest radiograph dated 03/30/2018 FINDINGS: There is cardiomegaly with vascular congestion. Superimposed pneumonia is not excluded. Clinical correlation is recommended. Interval development of a small moderate right pleural effusion. Atherosclerotic calcification of the aortic arch. No acute osseous pathology. IMPRESSION: Cardiomegaly with findings of worsened CHF and small right pleural effusion. Pneumonia is not excluded. Clinical correlation is recommended. Electronically Signed   By: Anner Crete M.D.   On: 04/07/2018 01:43   Dg Chest Port 1 View  Result Date: 04/04/2018 CLINICAL DATA:  Weakness EXAM: PORTABLE CHEST 1 VIEW COMPARISON:  11/15/2017 chest radiograph FINDINGS: Cardiomegaly with calcific aortic atherosclerosis. There is increased bibasilar airspace opacity without large consolidation. No overt pulmonary edema. No pneumothorax or sizable pleural effusion. IMPRESSION: Cardiomegaly and calcific aortic atherosclerosis (ICD10-I70.0). Bibasilar airspace opacities may indicate atelectasis and/or infection. If the patient is able, an upright PA and lateral series might be helpful. Electronically Signed   By: Ulyses Jarred M.D.   On: 03/14/2018 20:20   Ct Renal Stone Study  Result Date: 04/06/2018 CLINICAL DATA:  72 year old male with renal failure. Patient presenting with weakness. EXAM: CT ABDOMEN AND PELVIS WITHOUT CONTRAST TECHNIQUE: Multidetector CT imaging of the abdomen and pelvis was performed  following the standard protocol without IV contrast. COMPARISON:  CT of the abdomen pelvis dated 06/25/2017 FINDINGS: Evaluation of this exam is limited in the absence of intravenous contrast. Lower chest: Partially visualized moderate right pleural effusion with associated partial compressive atelectasis of the right lower lobe. Pneumonia is not excluded. Clinical correlation is recommended. There is mild cardiomegaly. Coronary vascular calcification noted. No intra-abdominal free air. Small subhepatic ascites extending into the pelvis. Hepatobiliary: The liver is unremarkable. There is stone within the gallbladder. Mild thickened appearance of the gallbladder wall, likely related to ascites. Ultrasound may provide better evaluation of the gallbladder if there is high clinical concern for acute cholecystitis. Pancreas: Unremarkable. No pancreatic ductal dilatation or surrounding inflammatory changes. Spleen: Normal in size without focal abnormality. Adrenals/Urinary Tract: The adrenal glands are unremarkable. There is a duplicated right renal collecting system. There is no hydronephrosis or nephrolithiasis on either side. Mild renal parenchyma atrophy. Multiple right renal hypodense lesions are not well characterized on this unenhanced CT but appears similar to prior CT. These measure up to 2.5 cm in the interpolar aspect of the kidney. The visualized ureters and urinary bladder appear unremarkable.  Stomach/Bowel: Mild thickened appearance of the colon although may be related to underdistention is concerning for colitis. Clinical correlation is recommended. No bowel obstruction. Normal appendix. Vascular/Lymphatic: Advanced aortoiliac atherosclerotic disease. No portal venous gas. There is no adenopathy. Reproductive: Mildly enlarged prostate gland with dystrophic calcification. Other: Small fat containing umbilical hernia as well as small fat containing bilateral inguinal hernias. Fatty atrophy of the musculature  of the abdominal and pelvic wall. There is mild diffuse subcutaneous edema. Musculoskeletal: Degenerative changes of the spine. No acute osseous pathology. IMPRESSION: 1. Findings concerning for colitis. Correlation with clinical exam and stool cultures recommended. No bowel obstruction. Normal appendix. 2. No hydronephrosis or nephrolithiasis. 3. Cholelithiasis. Ultrasound may provide better evaluation of the gallbladder if clinically indicated. 4. Moderate right pleural effusion with associated partial compressive atelectasis of the right lower lobe. There is a small ascites. 5.  Aortic Atherosclerosis (ICD10-I70.0). Electronically Signed   By: Anner Crete M.D.   On: 04/06/2018 01:07    Medications: Infusions: . sodium chloride    . heparin 1,400 Units/hr (04/07/18 0109)  . piperacillin-tazobactam (ZOSYN)  IV 2.25 g (04/07/18 0313)    Scheduled Medications: . feeding supplement (NEPRO CARB STEADY)  237 mL Oral BID BM  . ferrous sulfate  325 mg Oral Q breakfast  . furosemide  80 mg Intravenous Q8H  . insulin aspart  0-9 Units Subcutaneous TID WC  . midodrine  10 mg Oral TID WC  . patiromer  16.8 g Oral Daily    have reviewed scheduled and prn medications.  Physical Exam: General:NAD, comfortable Heart:RRR, s1s2 nl Lungs:clear b/l, no crackle Abdomen:soft, Non-tender, non-distended Extremities: Lower extremity cellulitis and edema.  Dyesha Henault Prasad Duwan Adrian 04/07/2018,10:32 AM  LOS: 2 days

## 2018-04-07 NOTE — Progress Notes (Signed)
Initial Nutrition Assessment  DOCUMENTATION CODES:   Obesity unspecified  INTERVENTION:   -Nepro Shake po BID, each supplement provides 425 kcal and 19 grams protein -MVI with minerals daily  NUTRITION DIAGNOSIS:   Increased nutrient needs related to wound healing as evidenced by estimated needs.  GOAL:   Patient will meet greater than or equal to 90% of their needs  MONITOR:   PO intake, Supplement acceptance, Labs, Weight trends, Skin, I & O's  REASON FOR ASSESSMENT:   Malnutrition Screening Tool    ASSESSMENT:   Christian Garner is a 72 y.o. male with history of chronic systolic CHF last EF measured in June 2018 was 60 to 35%, CAD status post PTCA, A. fib on Coumadin, chronic kidney disease stage III requiring dialysis briefly early part of 2018 was brought to the ER because of worsening bilateral lower extremity cellulitis and weakness.   Pt admitted with AKI.   Spoke with pt at bedside, who reports his appetite has been "horrible" over the past week. He does not feel like eating. He generally consumes 3 meals per day (Breakfast: boiled egg and grits, Lunch and Dinner: "just a few bites of something"). Pt shares that his appetite progressively gets worse throughout the day.   Pt reports he consumed some of his breakfast this morning. Meal completion 25-75%.   Pt shares he experienced a large weight loss several years ago related to diuresis. He shares his weight has been stable since that time. Reviewed of wt hx reveals wt stability.   Reviewed pt's DM control at home. Last Hgb A1c: 6.1 (09/16/17). Per pt, he checks bis blood sugars BID and readings are typically under 150. He used to be on sitagliptin, but currently does not take any medications- he reports he experienced a lot of hypoglycemic episodes when on this medication ("it went all the way down to the 40's").   Discussed importance of good meal and supplement intake to promote healing. Pt amenable to Nepro.    Albumin has a half-life of 21 days and is strongly affected by stress response and inflammatory process, therefore, do not expect to see an improvement in this lab value during acute hospitalization. When a patient presents with low albumin, it is likely skewed due to the acute inflammatory response. Note that low albumin is no longer used to diagnose malnutrition; [place] uses the new malnutrition guidelines published by the American Society for Parenteral and Enteral Nutrition (A.S.P.E.N.) and the Academy of Nutrition and Dietetics (AND).    Labs reviewed: CBGS: 124-126 (inpatient orders for glycemic control are 0-9 units insulin aspart TID with meals).   NUTRITION - FOCUSED PHYSICAL EXAM:    Most Recent Value  Orbital Region  Mild depletion  Upper Arm Region  No depletion  Thoracic and Lumbar Region  No depletion  Buccal Region  No depletion  Temple Region  Mild depletion  Clavicle Bone Region  No depletion  Clavicle and Acromion Bone Region  No depletion  Scapular Bone Region  No depletion  Dorsal Hand  No depletion  Patellar Region  No depletion  Anterior Thigh Region  No depletion  Posterior Calf Region  No depletion  Edema (RD Assessment)  Moderate  Hair  Reviewed  Eyes  Reviewed  Mouth  Reviewed  Skin  Reviewed  Nails  Reviewed       Diet Order:   Diet Order           Diet renal/carb modified with fluid restriction Diet-HS  Snack? Nothing; Fluid restriction: 1200 mL Fluid; Room service appropriate? Yes; Fluid consistency: Thin  Diet effective now          EDUCATION NEEDS:   Education needs have been addressed  Skin:  Skin Assessment: Skin Integrity Issues: Skin Integrity Issues:: Other (Comment) Other: bilateral leg cellulitis  Last BM:  04/06/18  Height:   Ht Readings from Last 1 Encounters:  03/28/2018 6\' 2"  (1.88 m)    Weight:   Wt Readings from Last 1 Encounters:  04/07/18 254 lb 3.1 oz (115.3 kg)    Ideal Body Weight:  86.4 kg  BMI:  Body  mass index is 32.64 kg/m.  Estimated Nutritional Needs:   Kcal:  2100-2300  Protein:  105-120 grams  Fluid:  per MD    Caetano Oberhaus A. Jimmye Norman, RD, LDN, CDE Pager: [phone] After hours Pager: [phone]

## 2018-04-07 NOTE — Consult Note (Signed)
[facility] Nurse wound consult note Reason for Consult:BLE cellulitis Wound type: vascular insufficiency Pressure Injury POA:NA Measurement:right pretibial 7cm x 8cm x 0.1cm full thickness wound with yellow exudate, once cleansed wound bed looks red. Has redness over entire lower leg anterior and posterior. 3cm x 3cm blood filled bulla proximal to large wound, and a 1.5cm x 1.5cm blood filled bulla distal to large wound. Lower lower leg edematous and erythema over entire area anterior and posteriorly. Legs shiny from edema Wound bed:see above Drainage (amount, consistency, odor) no odor Periwound:see above Dressing procedure/placement/frequency:Patient has had multiple foot and toe wounds present on prior admissions but both feet look good at this time. Patient has never had an ABI that I can see but suspect some arterial disease. I have provided nurses with orders for Xeroform dressings to anterior side of both lower legs, wrap legs with kerlix, adhere kerlix to kerlix, no tape on skin. Keep legs elevated. Patient has an EF of 30-35% as of last June. Will not wrap legs at this time. Patient's nutritional status is poor, recommend a nutritional consult or protein supplements to assist in wound healing, please order if you agree. Fara Olden, RN-C, WTA-C, [place] [facility] Associate [facility] Care Associate

## 2018-04-07 NOTE — Progress Notes (Signed)
[facility] PROGRESS NOTE  [name] XTK:[id number] DOB: Apr 30, 1946 DOA: 03/18/2018  PCP: [name], MD  Brief History/Interval Summary: 72 year old Caucasian male with a past medical history of chronic systolic CHF EF known to be 30 to 35% based on echo done in June 2018, coronary artery disease status post PTCA, atrial fibrillation on Coumadin, chronic kidney disease stage III presented with worsening bilateral lower extremity swelling and cellulitis as well as generalized weakness.  Patient was noted to be hypotensive and given fluids in the ED.  Creatinine was noted to be markedly elevated at 6.69 with the hyperkalemia.  Nephrology was consulted.  Patient was hospitalized.  Reason for Visit: Acute kidney injury with hyperkalemia.  Bilateral lower extremity cellulitis.  Consultants: Nephrology.  Critical care medicine  Procedures: None yet  Antibiotics: Zosyn and linezolid given in the ED Zosyn was continued.   Subjective/Interval History: Patient continues to be distracted but less so compared to yesterday.  Overnight events noted.  He got more short of breath.  Chest x-ray suggested pulmonary edema.  Seems to be stable this morning.  Denies any chest pain.    ROS: Denies any headaches  Objective:  Vital Signs  Vitals:   04/07/18 0347 04/07/18 0518 04/07/18 0745 04/07/18 0910  BP:  102/65 103/70 (!) 102/54  Pulse:  (!) 54 (!) 105 60  Resp:  18 17 20   Temp: (!) 97.4 F (36.3 C)  98 F (36.7 C)   TempSrc: Oral  Oral   SpO2:  92% 99% 94%  Weight:  115.3 kg (254 lb 3.1 oz)    Height:        Intake/Output Summary (Last 24 hours) at 04/07/2018 1012 Last data filed at 04/07/2018 0742 Gross per 24 hour  Intake 1321.38 ml  Output 800 ml  Net 521.38 ml   Filed Weights   03/20/2018 1852 04/06/18 0500 04/07/18 0518  Weight: 105.2 kg (232 lb) 112 kg (246 lb 14.6 oz) 115.3 kg (254 lb 3.1 oz)    General appearance: He is awake alert.  Somewhat distracted.   In no distress. Resp: Few crackles noted bilaterally at the bases.  No wheezing rhonchi.  Tachypneic at rest.   Cardio: S1-S2 is normal somewhat bradycardic.  No S3-S4.  No rubs murmurs or bruit GI: Abdomen is soft.  Nontender nondistended.  Bowel sounds are present.  No masses organomegaly Extremities: Continues to have swelling of both his lower extremities.  Remains erythematous.  Weeping and anterior skin wounds noted.  Chronic skin changes is also present.  Warm to touch.   Neurologic: Distracted.  No focal neurological deficits.  Lab Results:  Data Reviewed: I have personally reviewed following labs and imaging studies  CBC: Recent Labs  Lab 03/20/2018 1934 04/06/18 0121 04/07/18 0645  WBC 11.5* 12.3* 12.0*  NEUTROABS 9.5* 9.6* 9.8*  HGB 13.9 13.4 13.6  HCT 46.2 42.8 43.3  MCV 98.9 97.5 96.9  PLT 247 231 992    Basic Metabolic Panel: Recent Labs  Lab 04/03/2018 1934 04/06/18 0121 04/06/18 1105 04/07/18 0645  NA 129* 135 131* 132*  K 6.3* 6.0* 6.2* 6.8*  CL 96* 103 100 97*  CO2 19* 20* 22 24  GLUCOSE 149* 94 160* 118*  BUN 127* 119* 114* 116*  CREATININE 6.69* 6.24* 5.95* 6.51*  CALCIUM 8.0* 7.5* 7.4* 8.1*  MG  --   --   --  3.2*  PHOS  --  6.4*  --  6.5*    GFR: Estimated  Creatinine Clearance: 13.8 mL/min (A) (by C-G formula based on SCr of 6.51 mg/dL (H)).  Liver Function Tests: Recent Labs  Lab 03/30/2018 1934 04/06/18 0121 04/07/18 0645  AST 21 19  --   ALT 25 26  --   ALKPHOS 109 99  --   BILITOT 0.6 0.7  --   PROT 6.8 6.3*  --   ALBUMIN 2.8* 2.5* 2.5*    Recent Labs  Lab 03/20/2018 1934  LIPASE 47   Coagulation Profile: Recent Labs  Lab 04/06/18 0121 04/06/18 0734 04/06/18 2226 04/07/18 0645  INR >10.00* 3.37 1.69 1.62    Cardiac Enzymes: Recent Labs  Lab 04/06/18 0121  CKTOTAL 194  TROPONINI 0.04*    CBG: Recent Labs  Lab 04/06/18 0736 04/06/18 1151 04/06/18 1721 04/06/18 2040 04/07/18 0750  GLUCAP 130* 168* 157* 149*  126*    Thyroid Function Tests: Recent Labs    04/06/18 0121 04/06/18 0734  TSH 7.258*  --   FREET4  --  1.00     Recent Results (from the past 240 hour(s))  Blood Culture (routine x 2)     Status: None (Preliminary result)   Collection Time: 03/26/2018  7:34 PM  Result Value Ref Range Status   Specimen Description BLOOD LEFT ANTECUBITAL  Final   Special Requests   Final    BOTTLES DRAWN AEROBIC AND ANAEROBIC Blood Culture results may not be optimal due to an inadequate volume of blood received in culture bottles   Culture   Final    NO GROWTH 2 DAYS Performed at [facility], [place] [address], [place], [place] [address]    Report Status PENDING  Incomplete  Blood Culture (routine x 2)     Status: None (Preliminary result)   Collection Time: 03/16/2018  7:49 PM  Result Value Ref Range Status   Specimen Description BLOOD RIGHT HAND  Final   Special Requests   Final    BOTTLES DRAWN AEROBIC ONLY Blood Culture results may not be optimal due to an inadequate volume of blood received in culture bottles   Culture   Final    NO GROWTH 2 DAYS Performed at [facility], [place] [address] [place], [place] [address]    Report Status PENDING  Incomplete  Urine culture     Status: Abnormal (Preliminary result)   Collection Time: 03/12/2018 11:48 PM  Result Value Ref Range Status   Specimen Description URINE, RANDOM  Final   Special Requests   Final    NONE Performed at [facility], [place] [address], [place], [place] [address]    Culture >=100,000 COLONIES/mL KLEBSIELLA PNEUMONIAE (A)  Final   Report Status PENDING  Incomplete  MRSA PCR Screening     Status: None   Collection Time: 04/06/18  2:16 AM  Result Value Ref Range Status   MRSA by PCR NEGATIVE NEGATIVE Final    Comment:        The GeneXpert MRSA Assay (FDA approved for NASAL specimens only), is one component of a comprehensive MRSA colonization surveillance program. It is not intended to diagnose  MRSA infection nor to guide or monitor treatment for MRSA infections. Performed at [facility], [place] [address], [place], [place] [address]       Radiology Studies: US Renal  Result Date: 04/06/2018 CLINICAL DATA:  Acute kidney injury. EXAM: RENAL / URINARY TRACT ULTRASOUND COMPLETE COMPARISON:  CT abdomen pelvis from same day. FINDINGS: Right Kidney: Length: 14.2. Duplicated  collecting system. Echogenicity within normal limits. Mild cortical thinning. No mass or hydronephrosis visualized. 2.2 cm simple cyst in the lower pole. Left Kidney: Length: 12.0 cm. Echogenicity within normal limits. Mild cortical thickening. No mass or hydronephrosis visualized. Bladder: Decompressed by Foley catheter. Small ascites. IMPRESSION: 1. No acute abnormality.  Mild bilateral renal cortical thinning. 2. Small ascites. Electronically Signed   By: Titus Dubin M.D.   On: 04/06/2018 18:55   Dg Chest Port 1 View  Result Date: 04/07/2018 CLINICAL DATA:  72 year old male with respiratory failure and shortness of breath. EXAM: PORTABLE CHEST 1 VIEW COMPARISON:  Chest radiograph dated 03/15/2018 FINDINGS: There is cardiomegaly with vascular congestion. Superimposed pneumonia is not excluded. Clinical correlation is recommended. Interval development of a small moderate right pleural effusion. Atherosclerotic calcification of the aortic arch. No acute osseous pathology. IMPRESSION: Cardiomegaly with findings of worsened CHF and small right pleural effusion. Pneumonia is not excluded. Clinical correlation is recommended. Electronically Signed   By: Anner Crete M.D.   On: 04/07/2018 01:43   Dg Chest Port 1 View  Result Date: 03/13/2018 CLINICAL DATA:  Weakness EXAM: PORTABLE CHEST 1 VIEW COMPARISON:  11/15/2017 chest radiograph FINDINGS: Cardiomegaly with calcific aortic atherosclerosis. There is increased bibasilar airspace opacity without large consolidation. No overt pulmonary edema. No pneumothorax or  sizable pleural effusion. IMPRESSION: Cardiomegaly and calcific aortic atherosclerosis (ICD10-I70.0). Bibasilar airspace opacities may indicate atelectasis and/or infection. If the patient is able, an upright PA and lateral series might be helpful. Electronically Signed   By: Ulyses Jarred M.D.   On: 03/15/2018 20:20   Ct Renal Stone Study  Result Date: 04/06/2018 CLINICAL DATA:  72 year old male with renal failure. Patient presenting with weakness. EXAM: CT ABDOMEN AND PELVIS WITHOUT CONTRAST TECHNIQUE: Multidetector CT imaging of the abdomen and pelvis was performed following the standard protocol without IV contrast. COMPARISON:  CT of the abdomen pelvis dated 06/25/2017 FINDINGS: Evaluation of this exam is limited in the absence of intravenous contrast. Lower chest: Partially visualized moderate right pleural effusion with associated partial compressive atelectasis of the right lower lobe. Pneumonia is not excluded. Clinical correlation is recommended. There is mild cardiomegaly. Coronary vascular calcification noted. No intra-abdominal free air. Small subhepatic ascites extending into the pelvis. Hepatobiliary: The liver is unremarkable. There is stone within the gallbladder. Mild thickened appearance of the gallbladder wall, likely related to ascites. Ultrasound may provide better evaluation of the gallbladder if there is high clinical concern for acute cholecystitis. Pancreas: Unremarkable. No pancreatic ductal dilatation or surrounding inflammatory changes. Spleen: Normal in size without focal abnormality. Adrenals/Urinary Tract: The adrenal glands are unremarkable. There is a duplicated right renal collecting system. There is no hydronephrosis or nephrolithiasis on either side. Mild renal parenchyma atrophy. Multiple right renal hypodense lesions are not well characterized on this unenhanced CT but appears similar to prior CT. These measure up to 2.5 cm in the interpolar aspect of the kidney. The  visualized ureters and urinary bladder appear unremarkable. Stomach/Bowel: Mild thickened appearance of the colon although may be related to underdistention is concerning for colitis. Clinical correlation is recommended. No bowel obstruction. Normal appendix. Vascular/Lymphatic: Advanced aortoiliac atherosclerotic disease. No portal venous gas. There is no adenopathy. Reproductive: Mildly enlarged prostate gland with dystrophic calcification. Other: Small fat containing umbilical hernia as well as small fat containing bilateral inguinal hernias. Fatty atrophy of the musculature of the abdominal and pelvic wall. There is mild diffuse subcutaneous edema. Musculoskeletal: Degenerative changes of the spine. No acute osseous pathology. IMPRESSION:  1. Findings concerning for colitis. Correlation with clinical exam and stool cultures recommended. No bowel obstruction. Normal appendix. 2. No hydronephrosis or nephrolithiasis. 3. Cholelithiasis. Ultrasound may provide better evaluation of the gallbladder if clinically indicated. 4. Moderate right pleural effusion with associated partial compressive atelectasis of the right lower lobe. There is a small ascites. 5.  Aortic Atherosclerosis (ICD10-I70.0). Electronically Signed   By: Anner Crete M.D.   On: 04/06/2018 01:07     Medications:  Scheduled: . feeding supplement (NEPRO CARB STEADY)  237 mL Oral BID BM  . ferrous sulfate  325 mg Oral Q breakfast  . furosemide  80 mg Intravenous Q8H  . insulin aspart  0-9 Units Subcutaneous TID WC  . midodrine  10 mg Oral TID WC  . patiromer  16.8 g Oral Daily   Continuous: . sodium chloride    . heparin 1,400 Units/hr (04/07/18 0109)  . piperacillin-tazobactam (ZOSYN)  IV 2.25 g (04/07/18 0313)   EVO:JJKKXF chloride, acetaminophen **OR** acetaminophen  Assessment/Plan:    Acute on chronic kidney disease stage III It appears that patient has been on dialysis previously but only for a brief while.  This was  approximately 1 year ago.  Patient's creatinine was 1.74 in December 2018.  Now markedly elevated at greater than 6.  No hydronephrosis noted on CT scan.  Patient with mild peripheral edema.  He got tachypneic overnight.  Nephrology is closely following.  They have increased the dose of Lasix.  He continues to have refractory hyperkalemia.  Additional dose of potassium binder ordered today.  He will be given dextrose insulin.  EKG done shows stable changes.  No changes suggestive of hyperkalemia.  We will repeat labs later today.  Monitor urine output.  Avoid nephrotoxic agents.  He did have low blood pressures yesterday and was started on midodrine.  Blood pressure was stable.  He was initially on bicarbonate infusion which has been discontinued.    Bilateral lower extremity edema and cellulitis Patient has skin changes suggestive of chronic venous stasis.  It looks like he developed swelling recently now with some skin breakdown and possible cellulitis.  Blood cultures remain negative so far.  MRSA PCR was negative.  Continue Zosyn for now.  UTI Unclear if he had symptoms.  However his urine cultures positive for Klebsiella.  Continue Zosyn for now.  Await sensitivities.  Hypotension Blood pressure remained low for the most part yesterday.  Started on midodrine with some improvement.  He remains asymptomatic.  Continue to watch.    Acute on chronic systolic CHF EF is known to be 30 to 35% based on echocardiogram from June 2018.  See above.  Monitor volume status closely.  On IV Lasix.    History of atrial fibrillation on anticoagulation with warfarin Supratherapeutic INR noted.  He was given vitamin K with improvement in INR.  Monitor on telemetry.  Continue amiodarone.  He has been placed on IV heparin.  History of coronary artery disease Stable.  History of diabetes mellitus type 2 CBGs are stable.  Abnormal TSH TSH is noted to be 7.2.  Free T4 normal.  No known history of thyroid  disease.  Outpatient monitoring.  Abnormal CT scan Multiple incidental findings noted including cholelithiasis.  He appears to be asymptomatic.  CT scan also raised concern for colitis however patient does not have any GI symptoms currently.  DVT Prophylaxis: IV heparin Code Status: Full code Family Communication: No family at bedside Disposition Plan: Await improvement in renal function.  PT and OT evaluation.    LOS: 2 days   [facility] Pager [phone] 04/07/2018, 10:12 AM  If 7PM-7AM, please contact night-coverage at [email or web address], password [facility]

## 2018-04-07 NOTE — Significant Event (Signed)
Rapid Response Event Note  Overview: Respiratory  Initial Focused Assessment: Called by RN about patient having respiratory distress and was being placed on NRB. Per RN, patient had some hypoxemia that started on day shift.   Upon arrival, patient was awake, not in acute distress, stated that he felt better but earlier was hyperventilating after getting his bath. Per staff, patient seems to get short of breath with exertion.  Currently, sats were maintaining, and patient was weaned down to 2L [place] and sats were 99%]. Lung sounds diminished throughout but air movement present bilaterally. SBP 90-100s, HR upper 40-50s.  BLE wounds/cellulitis, + AKI as well. [facility] NP was notified, ordered CXR and also came and saw the patient.  Interventions: - None-  Plan of Care (if not transferred): - CXR - worsened CHF, small R pleural effusions, and ? PNA.  - monitor respiratory status, encourage IS, CDB, and mobilization as patient tolerates.  Event Summary:   at    Call Time 0044 Arrival Time 0047 End Time 0115  Christian Garner R

## 2018-04-07 NOTE — Progress Notes (Signed)
CRITICAL VALUE ALERT  Critical Value:  Potassium 6.8  Date & Time Notied:  04/07/18 0732  Provider Notified: Dr. Maryland Pink  Orders Received/Actions taken: Will informed oncoming nurse and await orders

## 2018-04-08 ENCOUNTER — Inpatient Hospital Stay (HOSPITAL_COMMUNITY): Payer: Medicare Other

## 2018-04-08 DIAGNOSIS — I361 Nonrheumatic tricuspid (valve) insufficiency: Secondary | ICD-10-CM

## 2018-04-08 DIAGNOSIS — I340 Nonrheumatic mitral (valve) insufficiency: Secondary | ICD-10-CM

## 2018-04-08 DIAGNOSIS — I5023 Acute on chronic systolic (congestive) heart failure: Secondary | ICD-10-CM

## 2018-04-08 DIAGNOSIS — N183 Chronic kidney disease, stage 3 (moderate): Secondary | ICD-10-CM

## 2018-04-08 LAB — RENAL FUNCTION PANEL
ANION GAP: 14 (ref 5–15)
Albumin: 2.3 g/dL — ABNORMAL LOW (ref 3.5–5.0)
BUN: 119 mg/dL — AB (ref 8–23)
CHLORIDE: 100 mmol/L (ref 98–111)
CO2: 19 mmol/L — ABNORMAL LOW (ref 22–32)
Calcium: 7.9 mg/dL — ABNORMAL LOW (ref 8.9–10.3)
Creatinine, Ser: 6.49 mg/dL — ABNORMAL HIGH (ref 0.61–1.24)
GFR, EST AFRICAN AMERICAN: 9 mL/min — AB (ref >60)
GFR, EST NON AFRICAN AMERICAN: 8 mL/min — AB (ref >60)
Glucose, Bld: 88 mg/dL (ref 70–99)
POTASSIUM: 5.1 mmol/L (ref 3.5–5.1)
Phosphorus: 7.5 mg/dL — ABNORMAL HIGH (ref 2.5–4.6)
Sodium: 133 mmol/L — ABNORMAL LOW (ref 135–145)

## 2018-04-08 LAB — CBC
HEMATOCRIT: 42.8 % (ref 39.0–52.0)
Hemoglobin: 12.9 g/dL — ABNORMAL LOW (ref 13.0–17.0)
MCH: 29.8 pg (ref 26.0–34.0)
MCHC: 30.1 g/dL (ref 30.0–36.0)
MCV: 98.8 fL (ref 78.0–100.0)
Platelets: 198 10*3/uL (ref 150–400)
RBC: 4.33 MIL/uL (ref 4.22–5.81)
RDW: 17 % — AB (ref 11.5–15.5)
WBC: 11.7 10*3/uL — AB (ref 4.0–10.5)

## 2018-04-08 LAB — GLUCOSE, CAPILLARY
GLUCOSE-CAPILLARY: 138 mg/dL — AB (ref 70–99)
GLUCOSE-CAPILLARY: 133 mg/dL — AB (ref 70–99)
Glucose-Capillary: 90 mg/dL (ref 70–99)
Glucose-Capillary: 136 mg/dL — ABNORMAL HIGH (ref 70–99)

## 2018-04-08 LAB — ECHOCARDIOGRAM COMPLETE
Height: 74 in
Weight: 4067.05 oz

## 2018-04-08 LAB — URINE CULTURE: Culture: >=100000 — AB

## 2018-04-08 LAB — HEPARIN LEVEL (UNFRACTIONATED): Heparin Unfractionated: 0.47 IU/mL (ref 0.30–0.70)

## 2018-04-08 LAB — PROCALCITONIN: Procalcitonin: 0.27 ng/mL

## 2018-04-08 MED ORDER — PERFLUTREN LIPID MICROSPHERE
INTRAVENOUS | Status: AC
  Filled 2018-04-08: qty 10

## 2018-04-08 MED ORDER — CEFTRIAXONE SODIUM 1 G IJ SOLR
1.0000 g | INTRAMUSCULAR | Status: DC
  Administered 2018-04-08 – 2018-04-11 (×4): 1 g via INTRAVENOUS
  Filled 2018-04-08 (×4): qty 10

## 2018-04-08 MED ORDER — CALCIUM ACETATE (PHOS BINDER) 667 MG PO CAPS
667.0000 mg | ORAL_CAPSULE | Freq: Three times a day (TID) | ORAL | Status: DC
  Administered 2018-04-08 – 2018-04-11 (×9): 667 mg via ORAL
  Filled 2018-04-08 (×9): qty 1

## 2018-04-08 MED ORDER — SODIUM BICARBONATE 650 MG PO TABS
1300.0000 mg | ORAL_TABLET | Freq: Two times a day (BID) | ORAL | Status: DC
  Administered 2018-04-08 – 2018-04-11 (×6): 1300 mg via ORAL
  Filled 2018-04-08 (×7): qty 2

## 2018-04-08 MED ORDER — AMIODARONE HCL 200 MG PO TABS
200.0000 mg | ORAL_TABLET | Freq: Every day | ORAL | Status: DC
  Administered 2018-04-08 – 2018-04-11 (×4): 200 mg via ORAL
  Filled 2018-04-08 (×4): qty 1

## 2018-04-08 MED ORDER — ALBUMIN HUMAN 25 % IV SOLN
25.0000 g | Freq: Four times a day (QID) | INTRAVENOUS | Status: AC
  Administered 2018-04-08 – 2018-04-09 (×5): 25 g via INTRAVENOUS
  Filled 2018-04-08 (×3): qty 100
  Filled 2018-04-08: qty 50
  Filled 2018-04-08: qty 100
  Filled 2018-04-08: qty 50
  Filled 2018-04-08: qty 100

## 2018-04-08 MED ORDER — FUROSEMIDE 10 MG/ML IJ SOLN
120.0000 mg | Freq: Three times a day (TID) | INTRAMUSCULAR | Status: DC
  Administered 2018-04-08 – 2018-04-12 (×11): 120 mg via INTRAVENOUS
  Filled 2018-04-08: qty 10
  Filled 2018-04-08 (×2): qty 12
  Filled 2018-04-08: qty 10
  Filled 2018-04-08 (×2): qty 12
  Filled 2018-04-08 (×6): qty 10

## 2018-04-08 NOTE — Progress Notes (Signed)
Occupational Therapy Treatment Patient Details Name: Christian Garner MRN: [id number] DOB: August 07, 1946 Today's Date: 04/08/2018    History of present illness 72 year old male with a PMH including chronic systolic CHF, CAD status post PTCA, afib on Coumadin, chronic kidney disease stage III presented with worsening bilateral lower extremity swelling and cellulitis as well as generalized weakness.  Patient was noted to be hypotensive and given fluids in the ED. Acute kidney injury with hyperkalemia.   OT comments  Pt seen for second session today to assist RN staff with return to bed/transfer and to assist with peri care. Pt required increased assist from recliner max A +2 to stand for rear peri care and then for stand pivot transfer to bed. Mod A +2 assist for bed mobility. Pt is very motivated and SNF recommendation remains appropriate. OT will continue to follow acutely with next session to establish HEP for BUE to assist with transfers.   Follow Up Recommendations  SNF;Supervision/Assistance - 24 hour(Pt with good experience at [facility])    Equipment Recommendations  Other (comment)(defer to next venue)    Recommendations for Other Services Other (comment)(Palliative Medicine)    Precautions / Restrictions Precautions Precautions: Fall Precaution Comments: BLE wrapped, watch O2 Restrictions Weight Bearing Restrictions: No       Mobility Bed Mobility Overal bed mobility: Needs Assistance Bed Mobility: Sit to Supine     Supine to sit: Mod assist;HOB elevated Sit to supine: Mod assist;+2 for physical assistance;+2 for safety/equipment   General bed mobility comments: vc for sequencing, assist for BLE back into bed, and then positioning +2 assist  Transfers Overall transfer level: Needs assistance Equipment used: Rolling walker (2 wheeled) Transfers: Sit to/from [employer] Transfers Sit to Stand: Max assist;+2 physical assistance;+2 safety/equipment(good hand placement,  Max A +2 for boost into standing) Stand pivot transfers: Mod assist;+2 physical assistance;+2 safety/equipment       General transfer comment: increased assist needed from recliner, good hand placement from Pt    Balance Overall balance assessment: Needs assistance Sitting-balance support: Bilateral upper extremity supported;Feet supported Sitting balance-Leahy Scale: Fair     Standing balance support: Bilateral upper extremity supported Standing balance-Leahy Scale: Poor Standing balance comment: dependent on BUE support                           ADL either performed or assessed with clinical judgement   ADL Overall ADL's : Needs assistance/impaired Eating/Feeding: Set up;Bed level   Grooming: Set up;Sitting;Bed level;Wash/dry hands Grooming Details (indicate cue type and reason): completed upon reaching supine in bed Upper Body Bathing: Moderate assistance   Lower Body Bathing: Maximal assistance   Upper Body Dressing : Moderate assistance   Lower Body Dressing: Total assistance   Toilet Transfer: Maximal assistance;+2 for physical assistance;+2 for safety/equipment;Stand-pivot;BSC;RW Toilet Transfer Details (indicate cue type and reason): max A to rise from recliner with good hand placement Toileting- Clothing Manipulation and Hygiene: Maximal assistance;+2 for physical assistance;+2 for safety/equipment;Sit to/from stand Toileting - Clothing Manipulation Details (indicate cue type and reason): Pt able to maintain standing for approx 1 min for rear peri care. OT cleaned as much as possible as rectal pouch still in place.     Functional mobility during ADLs: Maximal assistance;+2 for physical assistance;+2 for safety/equipment;Rolling walker(SPT only) General ADL Comments: after return to supine, Pt proceeded to dry heave for approx 2 min.     Vision Patient Visual Report: No change from baseline     Perception  Praxis      Cognition  Arousal/Alertness: Awake/alert Behavior During Therapy: WFL for tasks assessed/performed Overall Cognitive Status: Within Functional Limits for tasks assessed                                          Exercises     Shoulder Instructions       General Comments wife and daughter present throghout session- report that he dry heaves often at home - VSS throughout session    Pertinent Vitals/ Pain       Pain Assessment: Faces Faces Pain Scale: Hurts even more Pain Location: BLE Pain Descriptors / Indicators: Grimacing;Moaning Pain Intervention(s): Limited activity within patient's tolerance;Monitored during session;Repositioned  Home Living Family/patient expects to be discharged to:: Private residence Living Arrangements: Spouse/significant other Available Help at Discharge: Family;Available 24 hours/day Type of Home: House Home Access: Stairs to enter [employer] of Steps: 3 Entrance Stairs-Rails: Left Home Layout: One level     Bathroom Shower/Tub: Occupational psychologist: Standard Bathroom Accessibility: Yes How Accessible: Accessible via walker Home Equipment: [place] - 2 wheels;Wheelchair - manual;Bedside commode          Prior Functioning/Environment Level of Independence: Needs assistance  Gait / Transfers Assistance Needed: RW for mobility. Limited community ambulator; takes w/c for MD appts ADL's / Homemaking Assistance Needed: Wife assisting with LB ADL. Pt only sponge bathing       Frequency  Min 2X/week        Progress Toward Goals  OT Goals(current goals can now be found in the care plan section)  Progress towards OT goals: Progressing toward goals  Acute Rehab OT Goals Patient Stated Goal: to stronger again OT Goal Formulation: With patient Time For Goal Achievement: 04/22/18 Potential to Achieve Goals: Good ADL Goals Pt Will Perform Lower Body Bathing: with set-up;with adaptive equipment;sitting/lateral  leans Pt Will Perform Lower Body Dressing: sit to/from stand;with mod assist Pt Will Transfer to Toilet: with mod assist;stand pivot transfer;bedside commode Pt Will Perform Toileting - Clothing Manipulation and hygiene: with mod assist;sit to/from stand Pt/caregiver will Perform Home Exercise Program: Increased strength;Both right and left upper extremity;With theraband;With written HEP provided Additional ADL Goal #1: Pt will perform bed mobility at min A level prior to engaging in ADL Additional ADL Goal #2: Pt will verbalize 3 ways of conserving energy during ADL with 1 or less verbal cue  Plan Discharge plan remains appropriate;Frequency remains appropriate    Co-evaluation                 AM-PAC PT "6 Clicks" Daily Activity     Outcome Measure   Help from another person eating meals?: None Help from another person taking care of personal grooming?: A Little Help from another person toileting, which includes using toliet, bedpan, or urinal?: A Lot Help from another person bathing (including washing, rinsing, drying)?: A Lot Help from another person to put on and taking off regular upper body clothing?: A Little Help from another person to put on and taking off regular lower body clothing?: A Lot 6 Click Score: 16    End of Session Equipment Utilized During Treatment: Gait belt;Rolling walker;Oxygen(5L)  OT Visit Diagnosis: Unsteadiness on feet (R26.81);Other abnormalities of gait and mobility (R26.89);Muscle weakness (generalized) (M62.81);Pain Pain - Right/Left: (bilateral) Pain - part of body: Leg   Activity Tolerance Patient tolerated treatment well  Patient Left in bed;with call bell/phone within reach;with family/visitor present   Nurse Communication Mobility status;Other (comment)(back in bed, flexiseal/rectal tube needs to be replaced)        Time: 5053-9767 OT Time Calculation (min): 17 min  Charges: OT General Charges $OT Visit: 1 Visit OT  Evaluation $OT Eval Moderate Complexity: 1 Mod OT Treatments $Self Care/Home Management : 8-22 mins  Hulda Humphrey OTR/L [phone]   Christian Garner 04/08/2018, 5:48 PM

## 2018-04-08 NOTE — Progress Notes (Signed)
ANTICOAGULATION CONSULT NOTE - Follow Up Consult  Pharmacy Consult for Heparin Indication: atrial fibrillation  Allergies  Allergen Reactions  . Metolazone     Per pt family, pt gets delirious and confused when taking metolazone    Patient Measurements: Height: 6\' 2"  (188 cm) Weight: 254 lb 3.1 oz (115.3 kg) IBW/kg (Calculated) : 82.2 Heparin Dosing Weight:    Vital Signs: Temp: 98.5 F (36.9 C) (06/28 0845) Temp Source: Oral (06/28 0845) BP: 90/61 (06/28 0845) Pulse Rate: 63 (06/28 0845)  Labs: Recent Labs    04/06/18 0121 04/06/18 0734  04/06/18 2226 04/07/18 0645 04/07/18 0959 04/07/18 1508 04/08/18 0616  HGB 13.4  --   --   --  13.6  --   --  12.9*  HCT 42.8  --   --   --  43.3  --   --  42.8  PLT 231  --   --   --  245  --   --  198  LABPROT >90.0* 33.9*  --  19.7* 19.1*  --   --   --   INR >10.00* 3.37  --  1.69 1.62  --   --   --   HEPARINUNFRC  --   --   --   --   --  0.41  --  0.47  CREATININE 6.24*  --    < >  --  6.51*  --  6.44* 6.49*  CKTOTAL 194  --   --   --   --   --   --   --   TROPONINI 0.04*  --   --   --   --   --   --   --    < > = values in this interval not displayed.    Estimated Creatinine Clearance: 13.9 mL/min (A) (by C-G formula based on SCr of 6.49 mg/dL (H)).  Assessment: 72 y/o male on warfarin PTA for Afib. INR supratherapeutic on admit - given vitamin K 5 mg IV on 6/26. Pharmacy consulted to bridge with heparin drip while warfarin on hold.  Heparin level is therapeutic at 0.47 on 1400 units/hr. No bleeding noted, CBC is stable.  Goal of Therapy:  Heparin level 0.3-0.7 units/ml Monitor platelets by anticoagulation protocol: Yes   Plan:  Continue heparin drip at 1400 units/hr Daily heparin level and CBC Monitor for s/sx of bleeding F/U transition back to warfarin   Renold Genta, PharmD, BCPS Clinical Pharmacist Clinical phone for 04/08/2018 until 3p is [phone] Please check AMION for all Pharmacist numbers by  unit 04/08/2018 8:58 AM

## 2018-04-08 NOTE — Progress Notes (Signed)
  Echocardiogram 2D Echocardiogram has been performed.  Christian Garner 04/08/2018, 3:50 PM

## 2018-04-08 NOTE — Progress Notes (Signed)
PT Cancellation Note  Patient Details Name: Christian Garner MRN: [id number] DOB: Oct 05, 1946   Cancelled Treatment:    Reason Eval/Treat Not Completed: Patient declined, no reason specified.  Reports he had a rough night, and that he doesn't need PT because he can't walk right now.  Would likely benefit, will f/u as able.    Michel Santee 04/08/2018, 1:42 PM

## 2018-04-08 NOTE — Evaluation (Signed)
Occupational Therapy Evaluation Patient Details Name: GEOFF DACANAY MRN: [id number] DOB: Sep 06, 1946 Today's Date: 04/08/2018    History of Present Illness 72 year old male with a PMH including chronic systolic CHF, CAD status post PTCA, afib on Coumadin, chronic kidney disease stage III presented with worsening bilateral lower extremity swelling and cellulitis as well as generalized weakness.  Patient was noted to be hypotensive and given fluids in the ED. Acute kidney injury with hyperkalemia.   Clinical Impression   PTA Pt has needed min A for ADL (wife assists with LB), and uses a RW for mobility - but until the past few days has been mod I with his RW. Pt is currently mod A +2 for SPT. Mod A for bed mobility. Pt puts forth excellent effort. Upon standing bedside - it was discovered that his rectal pouch was leaking and Pt was able to stand for cleaning, then pivot to recliner. Pt will benefit from skilled OT in the acute setting and afterwards at the SNF level to maximize safety and independence in ADL and functional transfers.    Follow Up Recommendations  SNF;Supervision/Assistance - 24 hour(Pt had good experience at [facility])    Equipment Recommendations  Other (comment)(defer to next venue)    Recommendations for Other Services Other (comment)(Palliative Medicine)     Precautions / Restrictions Precautions Precautions: Fall Precaution Comments: BLE wrapped, watch O2 Restrictions Weight Bearing Restrictions: No      Mobility Bed Mobility Overal bed mobility: Needs Assistance Bed Mobility: Supine to Sit;Sit to Supine     Supine to sit: Mod assist;HOB elevated     General bed mobility comments: mod A to bring hips EOB, Pt able to bring BLE to EOB without assist for supine>sit; max A for BLE back into bed and mod A +2 for bed positioning  Transfers Overall transfer level: Needs assistance Equipment used: Rolling walker (2 wheeled) Transfers: Sit to/from [employer] Sit to Stand: Mod assist;+2 physical assistance;+2 safety/equipment;From elevated surface Stand pivot transfers: +2 physical assistance;+2 safety/equipment;Mod assist       General transfer comment: mod A +2 for power up, min A +2 once upright for pivotal steps with RW    Balance Overall balance assessment: Needs assistance Sitting-balance support: Bilateral upper extremity supported;Feet supported Sitting balance-Leahy Scale: Fair     Standing balance support: Bilateral upper extremity supported Standing balance-Leahy Scale: Poor Standing balance comment: dependent on BUE support                           ADL either performed or assessed with clinical judgement   ADL Overall ADL's : Needs assistance/impaired Eating/Feeding: Set up;Bed level   Grooming: Set up;Sitting;Bed level   Upper Body Bathing: Moderate assistance   Lower Body Bathing: Maximal assistance   Upper Body Dressing : Moderate assistance   Lower Body Dressing: Total assistance   Toilet Transfer: Moderate assistance;+2 for physical assistance;+2 for safety/equipment;Stand-pivot;RW   Toileting- Clothing Manipulation and Hygiene: Maximal assistance;+2 for physical assistance;+2 for safety/equipment;Sit to/from stand Toileting - Clothing Manipulation Details (indicate cue type and reason): pt requires BUE support in standing and assist for peri care     Functional mobility during ADLs: Moderate assistance;+2 for physical assistance;+2 for safety/equipment;Rolling walker(SPT only)       Vision Patient Visual Report: No change from baseline       Perception     Praxis      Pertinent Vitals/Pain Pain Assessment: Faces Faces Pain Scale:  Hurts even more Pain Location: BLE Pain Descriptors / Indicators: Grimacing;Moaning Pain Intervention(s): Limited activity within patient's tolerance;Monitored during session;Repositioned     Hand Dominance Left   Extremity/Trunk  Assessment Upper Extremity Assessment Upper Extremity Assessment: Overall WFL for tasks assessed   Lower Extremity Assessment Lower Extremity Assessment: Defer to PT evaluation;Generalized weakness       Communication Communication Communication: HOH   Cognition Arousal/Alertness: Awake/alert Behavior During Therapy: WFL for tasks assessed/performed Overall Cognitive Status: Within Functional Limits for tasks assessed                                     General Comments       Exercises     Shoulder Instructions      Home Living Family/patient expects to be discharged to:: Private residence Living Arrangements: Spouse/significant other Available Help at Discharge: Family;Available 24 hours/day Type of Home: House Home Access: Stairs to enter [employer] of Steps: 3 Entrance Stairs-Rails: Left Home Layout: One level     Bathroom Shower/Tub: Occupational psychologist: Standard Bathroom Accessibility: Yes How Accessible: Accessible via walker Home Equipment: [place] - 2 wheels;Wheelchair - manual;Bedside commode          Prior Functioning/Environment Level of Independence: Needs assistance  Gait / Transfers Assistance Needed: RW for mobility. Limited community ambulator; takes w/c for MD appts ADL's / Homemaking Assistance Needed: Wife assisting with LB ADL. Pt only sponge bathing            OT Problem List: Decreased strength;Decreased range of motion;Decreased activity tolerance;Impaired balance (sitting and/or standing);Decreased safety awareness;Obesity;Pain;Increased edema      OT Treatment/Interventions: Self-care/ADL training;Therapeutic exercise;Energy conservation;DME and/or AE instruction;Therapeutic activities;Cognitive remediation/compensation;Patient/family education;Balance training    OT Goals(Current goals can be found in the care plan section) Acute Rehab OT Goals Patient Stated Goal: to stronger again OT Goal  Formulation: With patient Time For Goal Achievement: 04/22/18 Potential to Achieve Goals: Good ADL Goals Pt Will Perform Lower Body Bathing: with set-up;with adaptive equipment;sitting/lateral leans Pt Will Perform Lower Body Dressing: sit to/from stand;with mod assist Pt Will Transfer to Toilet: with mod assist;stand pivot transfer;bedside commode Pt Will Perform Toileting - Clothing Manipulation and hygiene: with mod assist;sit to/from stand Pt/caregiver will Perform Home Exercise Program: Increased strength;Both right and left upper extremity;With theraband;With written HEP provided Additional ADL Goal #1: Pt will perform bed mobility at min A level prior to engaging in ADL Additional ADL Goal #2: Pt will verbalize 3 ways of conserving energy during ADL with 1 or less verbal cue  OT Frequency: Min 2X/week   Barriers to D/C:            Co-evaluation              AM-PAC PT "6 Clicks" Daily Activity     Outcome Measure Help from another person eating meals?: None Help from another person taking care of personal grooming?: A Little Help from another person toileting, which includes using toliet, bedpan, or urinal?: A Lot Help from another person bathing (including washing, rinsing, drying)?: A Lot Help from another person to put on and taking off regular upper body clothing?: A Little Help from another person to put on and taking off regular lower body clothing?: A Lot 6 Click Score: 16   End of Session Equipment Utilized During Treatment: Gait belt;Rolling walker;Oxygen(5L) Nurse Communication: Mobility status;Other (comment)(flexi seal not working)  Activity  Tolerance: Patient tolerated treatment well Patient left: in chair;with call bell/phone within reach;with chair alarm set;with family/visitor present  OT Visit Diagnosis: Unsteadiness on feet (R26.81);Other abnormalities of gait and mobility (R26.89);Muscle weakness (generalized) (M62.81);Pain Pain - Right/Left:  (bilateral) Pain - part of body: Leg                Time: 1540-1613 OT Time Calculation (min): 33 min Charges:  OT General Charges $OT Visit: 1 Visit OT Evaluation $OT Eval Moderate Complexity: 1 Mod OT Treatments $Self Care/Home Management : 8-22 mins G-Codes:     Hulda Humphrey OTR/L [place] 04/08/2018, 5:32 PM

## 2018-04-08 NOTE — Progress Notes (Signed)
[facility] NEPHROLOGY PROGRESS NOTE  Assessment/ Plan: Pt is a 72 y.o. yo male A. fib on Coumadin, type 2 diabetes with foot ulcer, coronary artery disease status post stent, CHF with EF of 30 to 35%, CKD 3 with baseline serum creatinine around 2 admitted with generalized weakness and worsening lower extremity cellulitis.  Consulted for hyperkalemia and worsening renal failure.  Assessment/Plan:  #Acute kidney injury on CKD stage III: Multifactorial etiology including hypotension, CHF with reduced renal perfusion and infection ?sepsis.  The urinalysis with protein, RBC, WBC and bacteria. Urine PCR 0.9.  Ultrasound kidney with bilateral renal cortical thinning. -Patient looks volume overload on exam with elevated BNP.  Chest x-ray consistent with CHF.  Continue IV Lasix 80 mg every 8 hourly.  Urine output of only 600 cc in last 24 hours.  Added IV albumin.  I would like to order metolazone but listed as allergy.  Need to clarify the allergic reaction.  Serum creatinine level 6.4.  Monitor BMP, urine output.  If no improvement he is heading towards requiring dialysis.  Continue Foley catheter, avoid nephrotoxins.  -Patient had AKI in 12/2016 requiring CRRT when complements, anti-GBM, ANCA were negative. I discussed with the patient regarding worsening renal failure.  He understand that he may need dialysis if his kidney function continued to worsen.  #Hyperkalemia: Due to renal failure and was on potassium chloride at home.  Serum potassium level 5.1 today.  Continue Veltassa.  The higher dose of IV Lasix should help to excrete potassium.      #Metabolic acidosis: Started sodium bicarbonate.  #History of CHF with elevated BNP level.  On Lasix.I recommend to check echocardiogram and cardiology consult.  Discussed with the primary team.  # Hypotension: due to infection.  Started midodrine.  Monitor blood pressure.  Subjective: Seen and examined at bedside.  Denied nausea,  vomiting, chest pain.  Shortness of breath better today.  Objective Vital signs in last 24 hours: Vitals:   04/07/18 2206 04/08/18 0206 04/08/18 0606 04/08/18 0845  BP: 105/60 (!) 94/58 (!) 98/58 90/61  Pulse: (!) 56 (!) 51 (!) 53 63  Resp: 17 14 16 18   Temp:    98.5 F (36.9 C)  TempSrc:    Oral  SpO2: 99% 100% 98% 97%  Weight:      Height:       Weight change:   Intake/Output Summary (Last 24 hours) at 04/08/2018 1139 Last data filed at 04/08/2018 0926 Gross per 24 hour  Intake 1310.97 ml  Output 480 ml  Net 830.97 ml       Labs: Basic Metabolic Panel: Recent Labs  Lab 04/07/18 0645 04/07/18 1508 04/08/18 0616  NA 132* 132* 133*  K 6.8* 6.2* 5.1  CL 97* 97* 100  CO2 24 22 19*  GLUCOSE 118* 127* 88  BUN 116* 119* 119*  CREATININE 6.51* 6.44* 6.49*  CALCIUM 8.1* 8.0* 7.9*  PHOS 6.5* 6.7* 7.5*   Liver Function Tests: Recent Labs  Lab 04/08/2018 1934 04/06/18 0121 04/07/18 0645 04/07/18 1508 04/08/18 0616  AST 21 19  --   --   --   ALT 25 26  --   --   --   ALKPHOS 109 99  --   --   --   BILITOT 0.6 0.7  --   --   --   PROT 6.8 6.3*  --   --   --   ALBUMIN 2.8* 2.5* 2.5* 2.5* 2.3*   Recent Labs  Lab 03/31/2018 1934  LIPASE 47   No results for input(s): AMMONIA in the last 168 hours. CBC: Recent Labs  Lab 03/23/2018 1934 04/06/18 0121 04/07/18 0645 04/08/18 0616  WBC 11.5* 12.3* 12.0* 11.7*  NEUTROABS 9.5* 9.6* 9.8*  --   HGB 13.9 13.4 13.6 12.9*  HCT 46.2 42.8 43.3 42.8  MCV 98.9 97.5 96.9 98.8  PLT 247 231 245 198   Cardiac Enzymes: Recent Labs  Lab 04/06/18 0121  CKTOTAL 194  TROPONINI 0.04*   CBG: Recent Labs  Lab 04/07/18 0750 04/07/18 1140 04/07/18 1628 04/07/18 2128 04/08/18 0750  GLUCAP 126* 124* 165* 136* 90    Iron Studies: No results for input(s): IRON, TIBC, TRANSFERRIN, FERRITIN in the last 72 hours. Studies/Results: US Renal  Result Date: 04/06/2018 CLINICAL DATA:  Acute kidney injury. EXAM: RENAL / URINARY  TRACT ULTRASOUND COMPLETE COMPARISON:  CT abdomen pelvis from same day. FINDINGS: Right Kidney: Length: 14.2. Duplicated collecting system. Echogenicity within normal limits. Mild cortical thinning. No mass or hydronephrosis visualized. 2.2 cm simple cyst in the lower pole. Left Kidney: Length: 12.0 cm. Echogenicity within normal limits. Mild cortical thickening. No mass or hydronephrosis visualized. Bladder: Decompressed by Foley catheter. Small ascites. IMPRESSION: 1. No acute abnormality.  Mild bilateral renal cortical thinning. 2. Small ascites. Electronically Signed   By: Titus Dubin M.D.   On: 04/06/2018 18:55   Dg Chest Port 1 View  Result Date: 04/07/2018 CLINICAL DATA:  72 year old male with respiratory failure and shortness of breath. EXAM: PORTABLE CHEST 1 VIEW COMPARISON:  Chest radiograph dated 04/06/2018 FINDINGS: There is cardiomegaly with vascular congestion. Superimposed pneumonia is not excluded. Clinical correlation is recommended. Interval development of a small moderate right pleural effusion. Atherosclerotic calcification of the aortic arch. No acute osseous pathology. IMPRESSION: Cardiomegaly with findings of worsened CHF and small right pleural effusion. Pneumonia is not excluded. Clinical correlation is recommended. Electronically Signed   By: Anner Crete M.D.   On: 04/07/2018 01:43    Medications: Infusions: . sodium chloride    . albumin human    . cefTRIAXone (ROCEPHIN)  IV    . heparin 1,400 Units/hr (04/08/18 0926)    Scheduled Medications: . feeding supplement (NEPRO CARB STEADY)  237 mL Oral BID BM  . ferrous sulfate  325 mg Oral Q breakfast  . furosemide  80 mg Intravenous Q8H  . insulin aspart  0-9 Units Subcutaneous TID WC  . midodrine  10 mg Oral TID WC  . multivitamin with minerals  1 tablet Oral Daily  . patiromer  16.8 g Oral Daily  . sodium bicarbonate  1,300 mg Oral BID    have reviewed scheduled and prn medications.  Physical  Exam: General:NAD, comfortable Heart: Regular rate rhythm S1-S2 normal. Lungs: Coarse breath sound bilateral, no wheezing Abdomen:soft, Non-tender, non-distended Extremities: Lower extremity cellulitis and edema.  No improvement  Benjaman Artman Prasad Saralyn Willison 04/08/2018,11:39 AM  LOS: 3 days

## 2018-04-08 NOTE — Progress Notes (Signed)
Wound care was done. 

## 2018-04-08 NOTE — Consult Note (Addendum)
Cardiology Consultation:   Patient ID: Christian Garner; [id number]; 1946-08-28   Admit date: 03/19/2018 Date of Consult: 04/08/2018  Primary Care Provider: [name], MD Primary Cardiologist: Previously Dr. Jimmie Molly @[facility]  Advanced HF: Evaluated by Dr. Haroldine Laws in 2018. Last seen at [facility] by Dr. Stann Mainland   Patient Profile:   Christian Garner is a 72 y.o. male with a hx of chronic systolic HF secondary to ischemic cardiomyopathy with EF of 30-35% (deemed endstage HF and turned down for advanced therapies), CAD s/p PCI of the RCA in 2014 and PCI + DES to the LAD in 2017, PAF on amiodarone and chronic coumadin, Stage III CKD, chronic respiratory failure on home O2 (2L/min baseline), Type 2 diabetes and obesity, who is being seen today for the evaluation of acute on chronic CHF, at the request of Dr/ Maryland Pink, Internal Medicine.  History of Present Illness:   Christian Garner is a 72 y.o. male with a hx of chronic systolic HF secondary to ischemic cardiomyopathy with EF of 30-35%, CAD s/p PCI of the RCA in 2014 and PCI + DES to the LAD in 2017, PAF on amiodarone and chronic coumadin, Stage III CKD, chronic respiratory failure on home O2 (2L/min baseline), Type 2 diabetes and obesity, who is being seen today for the evaluation of acute on chronic CHF, at the request of Dr/ Maryland Pink, Internal Medicine.  He was previously being followed by Dr. Jimmie Molly w/ [facility] cardiology until 2017. He was being followed by Dr. Haroldine Laws and the AHF team for a brief period of time here at [name], when he was admitted in June 2018 for acute on chronic systolic CHF, in the setting of atrial fibrillation w/ RVR. He was started on IV amiodarone and milrinone. Converted to NSR with amiodarone which was transitioned to po. He failed milrinone in the hospital (worked initially then failed). Dr. Haroldine Laws did not feel that he was a candidate for advanced therapies due to his age and co morbidities. Palliative care/ Hospice was  recommended for end stage heart failure. His last OV with the [facility] was in July 2018. Pt opted to get a second opinion and went to [facility]  He was seen by Dr. Stann Mainland, who also outlined in his clinic note on 05/31/17, Pt "not though to be a good candidate for advanced heart failure therapies in light of his age and renal insuffiencey". Symptomatic relieve with use of diuretics was primary focus. His home torsemide was adjusted and metolazone was added as an adjunct, however I do not see that pt ever followed up at [facility] after that visit. It appears that since that time, pt's diuretic was changed from torsemide to Bumex, 4 mg BID, as reported on home med list.   On 03/26/2018, pt presented from home to the [facility] ED w/ complaints of generalized weakness, bilateral LEE and erythema, decreased appetite, difficulty urinating and overall failure to thrive. He was found to be hypotensive in the ED and given a bolus of IVFs. EKG showed SR w/ RBBB.  CXR demonstrated congestion and possible infiltrate. His Scr was markedly elevated at 6.69 and hyperkalemic with K of 6.3. Previous SCr was 1.74 in Dec 2018 (h/o brief CRRT requirement in June 2018 for AKI and hyperkalemia). ABG c/w metabolic acidosis. Based on the appearance of his legs, there was concern for cellulitis. Blood cultures were obtained and he was started on empiric antibiotics for cellulitis and PNA. Blood cultures thus far show no growth. Antibiotic changed from zosyn to Ceftriaxone.  Nephrology is also following pt this admit. His AKI on CKD is felt to be multifactorial 2/2 hypotension, CHF w/ likely cardiorenal syndrome and due to infection. For now, nephrology has recommended IV Lasix, 80 mg Q8H. Foley in place. They feel it is likely that the patient may be heading towards HD if his renal function continues to worsen. There has not been much change in his renal function. SCr today is 6.49. BUN 119. K however is improved, down from 6.8>>5.1. His GFR is 8 mL/min.    Cardiology has been consulted to assist w/ CHF. His admit BNP was 1,115.5. Weight appears to have increased from 232lb>246>>254 lb. ? Accuracy. He is volume up. I/Os since admit is net +7.2L. UOP is poor. Only 600 cc recorded out yesterday. Repeat echo ordered and pending.     Past Medical History:  Diagnosis Date  . ARF (acute renal failure) ([place]) 04/06/2018  . Arthritis    "left hand" (04/06/2018)  . CAD (coronary artery disease)   . Cellulitis and abscess of lower extremity   . CHF (congestive heart failure) (HCC)    EF 30%  . Chronic kidney disease, stage III (moderate) (HCC)   . History of kidney stones   . Pneumonia    "a couple times" (04/06/2018)  . Recurrent Clostridium difficile diarrhea 09/27/2017  . Type 2 diabetes, diet controlled ([place])     Past Surgical History:  Procedure Laterality Date  . CATARACT EXTRACTION W/ INTRAOCULAR LENS  IMPLANT, BILATERAL    . CLOSED REDUCTION HAND FRACTURE Left ~ 2014   At [name]  . CORONARY ANGIOPLASTY WITH STENT PLACEMENT    . CYSTOSCOPY W/ STONE MANIPULATION    . IR FLUORO GUIDE CV LINE RIGHT  09/27/2017  . IR REMOVAL TUN CV CATH W/O FL  10/01/2017  . IR US GUIDE VASC ACCESS RIGHT  09/27/2017     Home Medications:  Prior to Admission medications   Medication Sig Start Date End Date Taking? Authorizing Provider  acetaminophen (TYLENOL) 325 MG tablet Take 975 mg by mouth at bedtime as needed for mild pain.    Yes [provider]  amiodarone (PACERONE) 200 MG tablet Take 1 tablet (200 mg total) by mouth 2 (two) times daily. 04/05/17  Yes Allie Bossier, MD  atorvastatin (LIPITOR) 40 MG tablet Take 40 mg by mouth at bedtime.    Yes [provider]  bismuth subsalicylate (PEPTO BISMOL) 262 MG/15ML suspension Take 30 mLs by mouth every 6 (six) hours as needed for indigestion.   Yes [provider]  bumetanide (BUMEX) 2 MG tablet Take 2 tablets (4 mg total) by mouth 2 (two) times daily. Patient  taking differently: Take 3 mg by mouth 2 (two) times daily.  06/12/17  Yes Tawny Asal, MD  calcium carbonate (TUMS - DOSED IN MG ELEMENTAL CALCIUM) 500 MG chewable tablet Chew 1 tablet by mouth 4 (four) times daily as needed for indigestion or heartburn.    Yes [provider]  ferrous sulfate 325 (65 FE) MG tablet Take 325 mg by mouth daily with breakfast.   Yes [provider]  fluticasone (FLONASE) 50 MCG/ACT nasal spray Place 1 spray into both nostrils daily as needed for allergies or rhinitis.   Yes [provider]  nitroGLYCERIN (NITROSTAT) 0.4 MG SL tablet Place 0.4 mg under the tongue every 5 (five) minutes as needed for chest pain.   Yes [provider]  potassium chloride SA (K-DUR,KLOR-CON) 20 MEQ tablet Take 2  tablets (40 mEq total) by mouth 2 (two) times daily. Patient taking differently: Take 20 mEq by mouth daily.  06/12/17  Yes Tawny Asal, MD  Probiotic Product (PROBIOTIC-10 PO) Take 1 tablet by mouth daily.   Yes [provider]  traZODone (DESYREL) 50 MG tablet Take 50 mg by mouth at bedtime.   Yes [provider]  vitamin B-12 (CYANOCOBALAMIN) 1000 MCG tablet Take 2,000 mcg by mouth daily.    Yes [provider]  vitamin C (ASCORBIC ACID) 500 MG tablet Take 500 mg by mouth daily.   Yes [provider]  warfarin (COUMADIN) 2 MG tablet Take 1 tablet (2 mg total) by mouth daily. Patient taking differently: Take 3 mg by mouth daily at 6 PM. Take 2 tablets (4mg ) by mouth once daily with 1/2 1mg  tablet (0.5mg ) to equal 4.5mg  daily 06/12/17  Yes Tawny Asal, MD  zinc gluconate 50 MG tablet Take 50 mg by mouth daily.   Yes [provider]    Inpatient Medications: Scheduled Meds: . feeding supplement (NEPRO CARB STEADY)  237 mL Oral BID BM  . ferrous sulfate  325 mg Oral Q breakfast  . furosemide  80 mg Intravenous Q8H  . insulin aspart  0-9 Units Subcutaneous TID WC  . midodrine  10 mg Oral TID WC  .  multivitamin with minerals  1 tablet Oral Daily  . patiromer  16.8 g Oral Daily  . sodium bicarbonate  1,300 mg Oral BID   Continuous Infusions: . sodium chloride    . albumin human 25 g (04/08/18 1234)  . cefTRIAXone (ROCEPHIN)  IV 1 g (04/08/18 1254)  . heparin 1,400 Units/hr (04/08/18 1254)   PRN Meds: sodium chloride, acetaminophen **OR** acetaminophen, ondansetron (ZOFRAN) IV  Allergies:    Allergies  Allergen Reactions  . Metolazone     Per pt family, pt gets delirious and confused when taking metolazone    Social History:   Social History   Socioeconomic History  . Marital status: Married    Spouse name: Not on file  . Number of children: Not on file  . Years of education: Not on file  . Highest education level: Not on file  Occupational History  . Not on file  Social Needs  . Financial resource strain: Not on file  . Food insecurity:    Worry: Not on file    Inability: Not on file  . Transportation needs:    Medical: Not on file    Non-medical: Not on file  Tobacco Use  . Smoking status: Former Smoker    Packs/day: 1.00    Years: 18.00    Pack years: 18.00    Types: Cigarettes    Last attempt to quit: 1982    Years since quitting: 37.5  . Smokeless tobacco: Never Used  Substance and Sexual Activity  . Alcohol use: Not Currently  . Drug use: Never  . Sexual activity: Not Currently  Lifestyle  . Physical activity:    Days per week: Not on file    Minutes per session: Not on file  . Stress: Not on file  Relationships  . Social connections:    Talks on phone: Not on file    Gets together: Not on file    Attends religious service: Not on file    Active member of club or organization: Not on file    Attends meetings of clubs or organizations: Not on file    Relationship status: Not on file  .  Intimate partner violence:    Fear of current or ex partner: Not on file    Emotionally abused: Not on file    Physically abused: Not on file    Forced  sexual activity: Not on file  Other Topics Concern  . Not on file  Social History Narrative   Patient is cared for primarily by his wife at home    Family History:    Family History  Problem Relation Age of Onset  . Hypertension Mother   . Diabetes Mother   . Bone cancer Mother   . Hypertension Father   . Bladder Cancer Sister      ROS:  Please see the history of present illness.   All other ROS reviewed and negative.     Physical Exam/Data:   Vitals:   04/08/18 0206 04/08/18 0606 04/08/18 0845 04/08/18 1235  BP: (!) 94/58 (!) 98/58 90/61 (!) 100/59  Pulse: (!) 51 (!) 53 63 (!) 55  Resp: 14 16 18 18   Temp:   98.5 F (36.9 C) 98.7 F (37.1 C)  TempSrc:   Oral Oral  SpO2: 100% 98% 97% 92%  Weight:      Height:        Intake/Output Summary (Last 24 hours) at 04/08/2018 1308 Last data filed at 04/08/2018 1254 Gross per 24 hour  Intake 1041.46 ml  Output 480 ml  Net 561.46 ml   Filed Weights   04/01/2018 1852 04/06/18 0500 04/07/18 0518  Weight: 232 lb (105.2 kg) 246 lb 14.6 oz (112 kg) 254 lb 3.1 oz (115.3 kg)   Body mass index is 32.64 kg/m.  General:  Well nourished, well developed, in no acute distress HEENT: normal Lymph: no adenopathy Neck: no JVD Endocrine:  No thryomegaly Vascular: No carotid bruits; FA pulses 2+ bilaterally without bruits  Cardiac:  normal S1, S2; RRR; no murmur  Lungs:  clear to auscultation bilaterally, no wheezing, rhonchi or rales  Abd: soft, nontender, no hepatomegaly  Ext: bilateral LEE, both legs wrapped  Musculoskeletal:  No deformities, BUE and BLE strength normal and equal Skin: warm and dry  Neuro:  CNs 2-12 intact, no focal abnormalities noted Psych:  Normal affect   EKG:  The EKG was personally reviewed and demonstrates:  1st degree AVB w/ RBBB Telemetry:  Telemetry was personally reviewed and demonstrates:  1st degree av block, sinus brady 55 bpm  Relevant CV Studies: 2D Echo 03/23/17 Study Conclusions  - Left  ventricle: Inferior and posterior lateral hypokinesis Wall   thickness was increased in a pattern of severe LVH. Systolic   function was moderately to severely reduced. The estimated   ejection fraction was in the range of 30% to 35%. The study is   not technically sufficient to allow evaluation of LV diastolic   function. - Mitral valve: There was mild to moderate regurgitation. - Left atrium: The atrium was severely dilated. - Right ventricle: The cavity size was moderately dilated. - Right atrium: The atrium was moderately dilated. - Atrial septum: No defect or patent foramen ovale was identified. - Impressions: RV is dialted with septal flattening consisant wtih   cor pulmonale.  Impressions:  - RV is dialted with septal flattening consisant wtih cor   pulmonale.  Laboratory Data:  Chemistry Recent Labs  Lab 04/07/18 0645 04/07/18 1508 04/08/18 0616  NA 132* 132* 133*  K 6.8* 6.2* 5.1  CL 97* 97* 100  CO2 24 22 19*  GLUCOSE 118* 127* 88  BUN 116*  119* 119*  CREATININE 6.51* 6.44* 6.49*  CALCIUM 8.1* 8.0* 7.9*  GFRNONAA 8* 8* 8*  GFRAA 9* 9* 9*  ANIONGAP 11 13 14     Recent Labs  Lab 03/26/2018 1934 04/20/18 0121 04/07/18 0645 04/07/18 1508 04/08/18 0616  PROT 6.8 6.3*  --   --   --   ALBUMIN 2.8* 2.5* 2.5* 2.5* 2.3*  AST 21 19  --   --   --   ALT 25 26  --   --   --   ALKPHOS 109 99  --   --   --   BILITOT 0.6 0.7  --   --   --    Hematology Recent Labs  Lab Apr 20, 2018 0121 04/07/18 0645 04/08/18 0616  WBC 12.3* 12.0* 11.7*  RBC 4.39 4.47 4.33  HGB 13.4 13.6 12.9*  HCT 42.8 43.3 42.8  MCV 97.5 96.9 98.8  MCH 30.5 30.4 29.8  MCHC 31.3 31.4 30.1  RDW 17.1* 16.8* 17.0*  PLT 231 245 198   Cardiac Enzymes Recent Labs  Lab Apr 20, 2018 0121  TROPONINI 0.04*   No results for input(s): TROPIPOC in the last 168 hours.  BNP Recent Labs  Lab 04-20-2018 1415  BNP 1,115.5*    DDimer No results for input(s): DDIMER in the last 168  hours.  Radiology/Studies:  US Renal  Result Date: April 20, 2018 CLINICAL DATA:  Acute kidney injury. EXAM: RENAL / URINARY TRACT ULTRASOUND COMPLETE COMPARISON:  CT abdomen pelvis from same day. FINDINGS: Right Kidney: Length: 14.2. Duplicated collecting system. Echogenicity within normal limits. Mild cortical thinning. No mass or hydronephrosis visualized. 2.2 cm simple cyst in the lower pole. Left Kidney: Length: 12.0 cm. Echogenicity within normal limits. Mild cortical thickening. No mass or hydronephrosis visualized. Bladder: Decompressed by Foley catheter. Small ascites. IMPRESSION: 1. No acute abnormality.  Mild bilateral renal cortical thinning. 2. Small ascites. Electronically Signed   By: Titus Dubin M.D.   On: April 20, 2018 18:55   Dg Chest Port 1 View  Result Date: 04/07/2018 CLINICAL DATA:  72 year old male with respiratory failure and shortness of breath. EXAM: PORTABLE CHEST 1 VIEW COMPARISON:  Chest radiograph dated 03/24/2018 FINDINGS: There is cardiomegaly with vascular congestion. Superimposed pneumonia is not excluded. Clinical correlation is recommended. Interval development of a small moderate right pleural effusion. Atherosclerotic calcification of the aortic arch. No acute osseous pathology. IMPRESSION: Cardiomegaly with findings of worsened CHF and small right pleural effusion. Pneumonia is not excluded. Clinical correlation is recommended. Electronically Signed   By: Anner Crete M.D.   On: 04/07/2018 01:43   Dg Chest Port 1 View  Result Date: 04/10/2018 CLINICAL DATA:  Weakness EXAM: PORTABLE CHEST 1 VIEW COMPARISON:  11/15/2017 chest radiograph FINDINGS: Cardiomegaly with calcific aortic atherosclerosis. There is increased bibasilar airspace opacity without large consolidation. No overt pulmonary edema. No pneumothorax or sizable pleural effusion. IMPRESSION: Cardiomegaly and calcific aortic atherosclerosis (ICD10-I70.0). Bibasilar airspace opacities may indicate  atelectasis and/or infection. If the patient is able, an upright PA and lateral series might be helpful. Electronically Signed   By: Ulyses Jarred M.D.   On: 03/23/2018 20:20   Ct Renal Stone Study  Result Date: 04/20/2018 CLINICAL DATA:  72 year old male with renal failure. Patient presenting with weakness. EXAM: CT ABDOMEN AND PELVIS WITHOUT CONTRAST TECHNIQUE: Multidetector CT imaging of the abdomen and pelvis was performed following the standard protocol without IV contrast. COMPARISON:  CT of the abdomen pelvis dated 06/25/2017 FINDINGS: Evaluation of this exam is limited in the absence of intravenous  contrast. Lower chest: Partially visualized moderate right pleural effusion with associated partial compressive atelectasis of the right lower lobe. Pneumonia is not excluded. Clinical correlation is recommended. There is mild cardiomegaly. Coronary vascular calcification noted. No intra-abdominal free air. Small subhepatic ascites extending into the pelvis. Hepatobiliary: The liver is unremarkable. There is stone within the gallbladder. Mild thickened appearance of the gallbladder wall, likely related to ascites. Ultrasound may provide better evaluation of the gallbladder if there is high clinical concern for acute cholecystitis. Pancreas: Unremarkable. No pancreatic ductal dilatation or surrounding inflammatory changes. Spleen: Normal in size without focal abnormality. Adrenals/Urinary Tract: The adrenal glands are unremarkable. There is a duplicated right renal collecting system. There is no hydronephrosis or nephrolithiasis on either side. Mild renal parenchyma atrophy. Multiple right renal hypodense lesions are not well characterized on this unenhanced CT but appears similar to prior CT. These measure up to 2.5 cm in the interpolar aspect of the kidney. The visualized ureters and urinary bladder appear unremarkable. Stomach/Bowel: Mild thickened appearance of the colon although may be related to  underdistention is concerning for colitis. Clinical correlation is recommended. No bowel obstruction. Normal appendix. Vascular/Lymphatic: Advanced aortoiliac atherosclerotic disease. No portal venous gas. There is no adenopathy. Reproductive: Mildly enlarged prostate gland with dystrophic calcification. Other: Small fat containing umbilical hernia as well as small fat containing bilateral inguinal hernias. Fatty atrophy of the musculature of the abdominal and pelvic wall. There is mild diffuse subcutaneous edema. Musculoskeletal: Degenerative changes of the spine. No acute osseous pathology. IMPRESSION: 1. Findings concerning for colitis. Correlation with clinical exam and stool cultures recommended. No bowel obstruction. Normal appendix. 2. No hydronephrosis or nephrolithiasis. 3. Cholelithiasis. Ultrasound may provide better evaluation of the gallbladder if clinically indicated. 4. Moderate right pleural effusion with associated partial compressive atelectasis of the right lower lobe. There is a small ascites. 5.  Aortic Atherosclerosis (ICD10-I70.0). Electronically Signed   By: Anner Crete M.D.   On: 04/06/2018 01:07    Assessment and Plan:   Christian Garner is a 72 y.o. male with a hx of chronic systolic HF secondary to ischemic cardiomyopathy with EF of 30-35% (deemed endstage HF and turned down for advanced therapies), CAD s/p PCI of the RCA in 2014 and PCI + DES to the LAD in 2017, PAF on amiodarone and chronic coumadin, Stage III CKD, chronic respiratory failure on home O2 (2L/min baseline), Type 2 diabetes and obesity, who is being seen today for the evaluation of acute on chronic CHF, at the request of Dr/ Maryland Pink, Internal Medicine.  Cardiology has been consulted to assist w/ CHF. His admit BNP was 1,115.5. Weight appears to have increased from 232lb>246>>254 lb. ? Accuracy. He is volume up. I/Os since admit is net +7.2L. UOP is poor. Only 600 cc recorded out yesterday. Repeat echo ordered  and pending.    1. Acute on Chronic Systolic HF/ Ischemic Cardiomyopathy: repeat echo has been ordered by primary team, but I do not think this will change management. This is a pt who has been classified as having end stage systolic HF and has been turned down by both the [name] here and [facility] as well as advance HF specialist at [name] for advance heart failure therapies given his age and other co morbidities. He failed milrinone in June 2018, when he was admitted for acute on chronic CHF. Palliative Care/ Hospice was previously recommended, but pt refused. It is outlined in previous notes that he has limited ability to understand his medical issues and  poor prognosis thoroughly. Recommend continuing to focus on symptom relief by volume control. Given worsening of his renal function, w/ GFR now at 8 mL/min, SCr now of 6 and decreased UOP despite high dose diuretics, initiation of HD may be his only hope of better controlling volume and improving symptoms.    2. Acute on Chronic Kidney Disease: per above. Nephrology following. May be nearing HD.   3. CAD: h/o PCI to the RCA in 2014 and LAD in 2017. Denies CP. Continue medical therapy only. Clearly not a candidate for any additional invasive ischemic evaluations due to conditions outlined above, including worsening of his renal function.   4. Bilateral LE Cellulitis: on antibiotics per IM. Blood cultures negative.    For questions or updates, please contact [place] Please consult [email or web address] for contact info under Cardiology/STEMI.   Signed, Lyda Jester, PA-C  04/08/2018 1:08 PM   As above, patient seen and examined.  Briefly he is a 72 year old male with past medical history of end-stage chronic systolic congestive heart failure, coronary artery disease, chronic stage III kidney disease, paroxysmal atrial fibrillation, diabetes mellitus for evaluation of acute on chronic systolic congestive heart failure.  Last echocardiogram June 2018  showed ejection fraction 30 to 35%, severe left ventricular hypertrophy, mild to moderate mitral regurgitation and biatrial enlargement.  There was note of right ventricular enlargement and findings suggestive of cor pulmonale.  Patient previously seen by Dr. Haroldine Laws and felt to have end-stage systolic congestive heart failure.  He apparently failed milrinone during a previous hospitalization and is not felt to be a candidate for advanced therapies because of age and comorbidities.  It was felt palliative care/hospice was indicated.  He was also seen by Dr. Stann Mainland at [name] and felt not to be a candidate for advanced therapies.  He has been treated with diuretics for symptomatic relief.  He was admitted on June 25 with generalized weakness, mild dyspnea and worsening lower extremity edema.  He also had failure to thrive.  He was found to have acute on chronic renal failure with creatinine 6.69 and potassium 6.3.  Urinalysis showed Klebsiella UTI and he is also felt to have lower ext cellulitis.  He has been treated with diuretics and nephrology is following and considering dialysis. Also treated with antibiotics. Patient denies chest pain.  Cardiology now asked to evaluate. Patient markedly volume overloaded on examination with diffuse anasarca.  Lower extremity erythema and they are dressed. Laboratories at present showed potassium 5.1, BUN 119, creatinine 6.49 and albumin 2.3.  Electrocardiogram shows sinus rhythm, first-degree AV block, right bundle branch block and anterior/inferior infarct.  1 acute on chronic systolic congestive heart failure-patient is markedly volume overloaded.  We will await results of follow-up echocardiogram ordered by primary care.  He has been seen by Dr. Haroldine Laws in the past as well as Dr. Stann Mainland at [facility]  He is not a candidate for advanced therapies because of age and comorbidities.  He failed milrinone in the past.  I will increase Lasix to 120 mg IV every  8 hours.  He apparently has an allergy to metolazone.  He has been felt to be end-stage in the past.  I would recommend palliative care/hospice consult.  2 acute on chronic stage III kidney disease-patient is being followed by nephrology.  This is likely a combination of low output from chronic systolic congestive heart failure, hypotension and contribution from urinary tract infection/cellulitis.  Patient may require dialysis although given hypotension he may not tolerate.  3 lower extremity cellulitis/Klebsiella urinary tract infection-continue antibiotics.  4 history of atrial fibrillation-patient in sinus rhythm.  I will resume amiodarone 200 mg daily.  Continue IV heparin.  Prognosis is poor.  Kirk Ruths, MD

## 2018-04-08 NOTE — Care Management Important Message (Signed)
Important Message  Patient Details  Name: Christian Garner MRN: [id number] Date of Birth: 06-17-1946   Medicare Important Message Given:  Yes    [name] 04/08/2018, 2:45 PM

## 2018-04-08 NOTE — Progress Notes (Signed)
OT Cancellation Note  Patient Details Name: Christian Garner MRN: [id number] DOB: 1946-06-29   Cancelled Treatment:    Reason Eval/Treat Not Completed: Patient at procedure or test/ unavailable(Echo)  [name] 04/08/2018, 2:39 PM  Hulda Humphrey OTR/L [phone]

## 2018-04-08 NOTE — Progress Notes (Signed)
[facility] PROGRESS NOTE  Christian Garner DXI:[id number] DOB: 10-27-1945 DOA: 04/10/2018  PCP: [name], MD  Brief History/Interval Summary: 72 year old Caucasian male with a past medical history of chronic systolic CHF EF known to be 30 to 35% based on echo done in June 2018, coronary artery disease status post PTCA, atrial fibrillation on Coumadin, chronic kidney disease stage III presented with worsening bilateral lower extremity swelling and cellulitis as well as generalized weakness.  Patient was noted to be hypotensive and given fluids in the ED.  Creatinine was noted to be markedly elevated at 6.69 with the hyperkalemia.  Nephrology was consulted.  Patient was hospitalized.  Reason for Visit: Acute kidney injury with hyperkalemia.  Bilateral lower extremity cellulitis.  Consultants: Nephrology.  Critical care medicine.  Cardiology  Procedures: Transthoracic echocardiogram is pending  Antibiotics: Zosyn and linezolid given in the ED Zosyn was discontinued on 6/28 Ceftriaxone 6/28   Subjective/Interval History: Patient remains distracted.  However he states that his breathing appears to be better today compared to yesterday.  Denies any chest pain.     ROS: Denies any headaches  Objective:  Vital Signs  Vitals:   04/07/18 2206 04/08/18 0206 04/08/18 0606 04/08/18 0845  BP: 105/60 (!) 94/58 (!) 98/58 90/61  Pulse: (!) 56 (!) 51 (!) 53 63  Resp: 17 14 16 18   Temp:    98.5 F (36.9 C)  TempSrc:    Oral  SpO2: 99% 100% 98% 97%  Weight:      Height:        Intake/Output Summary (Last 24 hours) at 04/08/2018 1014 Last data filed at 04/08/2018 0926 Gross per 24 hour  Intake 1310.97 ml  Output 480 ml  Net 830.97 ml   Filed Weights   04/04/2018 1852 04/06/18 0500 04/07/18 0518  Weight: 105.2 kg (232 lb) 112 kg (246 lb 14.6 oz) 115.3 kg (254 lb 3.1 oz)    General appearance: Awake alert.  Distracted.  In no distress Resp: Crackles noted bilaterally at the  bases.  No wheezing or rhonchi.  Mildly tachypneic at rest.  No use of accessory muscles. Cardio: S1-S2 is bradycardic regular.  No S3-S4.  No obvious murmurs appreciated GI: Abdomen is soft.  Nontender nondistended.  Bowel sounds are present normal.  No masses organomegaly Extremities: Continues to have swelling of both his lower extremities.  Remain erythematous.  Covered in dressing today. Chronic skin changes is also present.  Warm to touch.   Neurologic: Distracted.  No obvious focal neurological deficits  Lab Results:  Data Reviewed: I have personally reviewed following labs and imaging studies  CBC: Recent Labs  Lab 04/08/2018 1934 04/06/18 0121 04/07/18 0645 04/08/18 0616  WBC 11.5* 12.3* 12.0* 11.7*  NEUTROABS 9.5* 9.6* 9.8*  --   HGB 13.9 13.4 13.6 12.9*  HCT 46.2 42.8 43.3 42.8  MCV 98.9 97.5 96.9 98.8  PLT 247 231 245 767    Basic Metabolic Panel: Recent Labs  Lab 04/06/18 0121 04/06/18 1105 04/07/18 0645 04/07/18 1508 04/08/18 0616  NA 135 131* 132* 132* 133*  K 6.0* 6.2* 6.8* 6.2* 5.1  CL 103 100 97* 97* 100  CO2 20* 22 24 22  19*  GLUCOSE 94 160* 118* 127* 88  BUN 119* 114* 116* 119* 119*  CREATININE 6.24* 5.95* 6.51* 6.44* 6.49*  CALCIUM 7.5* 7.4* 8.1* 8.0* 7.9*  MG  --   --  3.2*  --   --   PHOS 6.4*  --  6.5* 6.7*  7.5*    GFR: Estimated Creatinine Clearance: 13.9 mL/min (A) (by C-G formula based on SCr of 6.49 mg/dL (H)).  Liver Function Tests: Recent Labs  Lab 03/27/2018 1934 04/06/18 0121 04/07/18 0645 04/07/18 1508 04/08/18 0616  AST 21 19  --   --   --   ALT 25 26  --   --   --   ALKPHOS 109 99  --   --   --   BILITOT 0.6 0.7  --   --   --   PROT 6.8 6.3*  --   --   --   ALBUMIN 2.8* 2.5* 2.5* 2.5* 2.3*    Recent Labs  Lab 04/04/2018 1934  LIPASE 47   Coagulation Profile: Recent Labs  Lab 04/06/18 0121 04/06/18 0734 04/06/18 2226 04/07/18 0645  INR >10.00* 3.37 1.69 1.62    Cardiac Enzymes: Recent Labs  Lab 04/06/18 0121   CKTOTAL 194  TROPONINI 0.04*    CBG: Recent Labs  Lab 04/07/18 0750 04/07/18 1140 04/07/18 1628 04/07/18 2128 04/08/18 0750  GLUCAP 126* 124* 165* 136* 90    Thyroid Function Tests: Recent Labs    04/06/18 0121 04/06/18 0734  TSH 7.258*  --   FREET4  --  1.00     Recent Results (from the past 240 hour(s))  Blood Culture (routine x 2)     Status: None (Preliminary result)   Collection Time: 04/08/2018  7:34 PM  Result Value Ref Range Status   Specimen Description BLOOD LEFT ANTECUBITAL  Final   Special Requests   Final    BOTTLES DRAWN AEROBIC AND ANAEROBIC Blood Culture results may not be optimal due to an inadequate volume of blood received in culture bottles   Culture   Final    NO GROWTH 3 DAYS Performed at [facility], [place] [address], [place], [place] [address]    Report Status PENDING  Incomplete  Blood Culture (routine x 2)     Status: None (Preliminary result)   Collection Time: 03/21/2018  7:49 PM  Result Value Ref Range Status   Specimen Description BLOOD RIGHT HAND  Final   Special Requests   Final    BOTTLES DRAWN AEROBIC ONLY Blood Culture results may not be optimal due to an inadequate volume of blood received in culture bottles   Culture   Final    NO GROWTH 3 DAYS Performed at [facility], [place] [address], [place], [place] [address]    Report Status PENDING  Incomplete  Urine culture     Status: Abnormal   Collection Time: 03/24/2018 11:48 PM  Result Value Ref Range Status   Specimen Description URINE, RANDOM  Final   Special Requests   Final    NONE Performed at [facility], [place] [address], [place], [place] [address]    Culture >=100,000 COLONIES/mL KLEBSIELLA PNEUMONIAE (A)  Final   Report Status 04/08/2018 FINAL  Final   Organism ID, Bacteria KLEBSIELLA PNEUMONIAE (A)  Final      Susceptibility   Klebsiella pneumoniae - MIC*    AMPICILLIN >=32 RESISTANT Resistant     CEFAZOLIN <=4 SENSITIVE Sensitive     CEFTRIAXONE <=1  SENSITIVE Sensitive     CIPROFLOXACIN <=0.25 SENSITIVE Sensitive     GENTAMICIN <=1 SENSITIVE Sensitive     IMIPENEM <=0.25 SENSITIVE Sensitive     NITROFURANTOIN 128 RESISTANT Resistant     TRIMETH/SULFA <=20 SENSITIVE Sensitive     AMPICILLIN/SULBACTAM 8 SENSITIVE Sensitive  PIP/TAZO <=4 SENSITIVE Sensitive     Extended ESBL NEGATIVE Sensitive     * >=100,000 COLONIES/mL KLEBSIELLA PNEUMONIAE  MRSA PCR Screening     Status: None   Collection Time: 04/06/18  2:16 AM  Result Value Ref Range Status   MRSA by PCR NEGATIVE NEGATIVE Final    Comment:        The GeneXpert MRSA Assay (FDA approved for NASAL specimens only), is one component of a comprehensive MRSA colonization surveillance program. It is not intended to diagnose MRSA infection nor to guide or monitor treatment for MRSA infections. Performed at [facility], [place] [address], [place], [place] [address]       Radiology Studies: US Renal  Result Date: 04/06/2018 CLINICAL DATA:  Acute kidney injury. EXAM: RENAL / URINARY TRACT ULTRASOUND COMPLETE COMPARISON:  CT abdomen pelvis from same day. FINDINGS: Right Kidney: Length: 14.2. Duplicated collecting system. Echogenicity within normal limits. Mild cortical thinning. No mass or hydronephrosis visualized. 2.2 cm simple cyst in the lower pole. Left Kidney: Length: 12.0 cm. Echogenicity within normal limits. Mild cortical thickening. No mass or hydronephrosis visualized. Bladder: Decompressed by Foley catheter. Small ascites. IMPRESSION: 1. No acute abnormality.  Mild bilateral renal cortical thinning. 2. Small ascites. Electronically Signed   By: Titus Dubin M.D.   On: 04/06/2018 18:55   Dg Chest Port 1 View  Result Date: 04/07/2018 CLINICAL DATA:  72 year old male with respiratory failure and shortness of breath. EXAM: PORTABLE CHEST 1 VIEW COMPARISON:  Chest radiograph dated 03/30/2018 FINDINGS: There is cardiomegaly with vascular congestion. Superimposed  pneumonia is not excluded. Clinical correlation is recommended. Interval development of a small moderate right pleural effusion. Atherosclerotic calcification of the aortic arch. No acute osseous pathology. IMPRESSION: Cardiomegaly with findings of worsened CHF and small right pleural effusion. Pneumonia is not excluded. Clinical correlation is recommended. Electronically Signed   By: Anner Crete M.D.   On: 04/07/2018 01:43     Medications:  Scheduled: . feeding supplement (NEPRO CARB STEADY)  237 mL Oral BID BM  . ferrous sulfate  325 mg Oral Q breakfast  . furosemide  80 mg Intravenous Q8H  . insulin aspart  0-9 Units Subcutaneous TID WC  . midodrine  10 mg Oral TID WC  . multivitamin with minerals  1 tablet Oral Daily  . patiromer  16.8 g Oral Daily  . sodium bicarbonate  1,300 mg Oral BID   Continuous: . sodium chloride    . cefTRIAXone (ROCEPHIN)  IV    . heparin 1,400 Units/hr (04/08/18 0926)   ZHY:QMVHQI chloride, acetaminophen **OR** acetaminophen, ondansetron (ZOFRAN) IV  Assessment/Plan:    Acute on chronic kidney disease stage III-hyperkalemia It appears that patient has been on dialysis previously but only for a brief while.  This was approximately 1 year ago.  Patient's creatinine was 1.74 in December 2018.  Now markedly elevated at greater than 6.  No hydronephrosis noted on CT scan.   Nephrology is closely following.  He is getting higher doses of Lasix intravenously.  Discussed with nephrology.  Metolazone to be considered.  Etiology remains unclear.   There is concern for cardiorenal.  Patient does have known history of CHF.  Echocardiogram from 2018 showed a EF of 30 to 35%.  We will repeat another echocardiogram.  May be reasonable to involve cardiology as well.  They have been consulted.  Continues to have poor urine output.  Blood pressures remain soft and was started on midodrine.  He was  also initially on a bicarbonate infusion which was discontinued once he was  thought to have fluid overload. Potassium level has improved.   Bilateral lower extremity edema and cellulitis Patient has skin changes suggestive of chronic venous stasis.  It looks like he developed swelling recently now with some skin breakdown and possible cellulitis.  Blood cultures remain negative so far.  MRSA PCR was negative.  Zosyn changed to ceftriaxone.  UTI with Klebsiella Unclear if he had symptoms.  However his urine cultures positive for Klebsiella.  Change Zosyn to ceftriaxone today.  Hypotension Patient was started on midodrine.  Blood pressures have improved.  Await cardiology input as this could be driven by his CHF as well.      Acute on chronic systolic CHF EF is known to be 30 to 35% based on echocardiogram from June 2018.  See above.  Monitor volume status closely.  On IV Lasix.  Consult cardiology.  Repeat echocardiogram.  History of atrial fibrillation on anticoagulation with warfarin Supratherapeutic INR noted.  He was given vitamin K with improvement in INR.  Monitor on telemetry.  Continue amiodarone.  He has been placed on IV heparin.  History of coronary artery disease Stable.  History of diabetes mellitus type 2 CBGs are stable.  Abnormal TSH TSH is noted to be 7.2.  Free T4 normal.  No known history of thyroid disease.  Outpatient monitoring.  Abnormal CT scan Multiple incidental findings noted including cholelithiasis.  He appears to be asymptomatic.  CT scan also raised concern for colitis however patient does not have any GI symptoms currently.  DVT Prophylaxis: IV heparin Code Status: Full code Family Communication: Discussed with patient Disposition Plan: Management as outlined above.  Cardiology consulted.  Echocardiogram ordered.  PT evaluation.    LOS: 3 days   [facility] Pager [phone] 04/08/2018, 10:14 AM  If 7PM-7AM, please contact night-coverage at [email or web address], password [facility]

## 2018-04-09 DIAGNOSIS — L03119 Cellulitis of unspecified part of limb: Secondary | ICD-10-CM

## 2018-04-09 LAB — CBC
HEMATOCRIT: 39.3 % (ref 39.0–52.0)
Hemoglobin: 12 g/dL — ABNORMAL LOW (ref 13.0–17.0)
MCH: 30 pg (ref 26.0–34.0)
MCHC: 30.5 g/dL (ref 30.0–36.0)
MCV: 98.3 fL (ref 78.0–100.0)
PLATELETS: 187 10*3/uL (ref 150–400)
RBC: 4 MIL/uL — ABNORMAL LOW (ref 4.22–5.81)
RDW: 17 % — AB (ref 11.5–15.5)
WBC: 11.1 10*3/uL — ABNORMAL HIGH (ref 4.0–10.5)

## 2018-04-09 LAB — GLUCOSE, CAPILLARY
Glucose-Capillary: 103 mg/dL — ABNORMAL HIGH (ref 70–99)
Glucose-Capillary: 117 mg/dL — ABNORMAL HIGH (ref 70–99)
Glucose-Capillary: 140 mg/dL — ABNORMAL HIGH (ref 70–99)
Glucose-Capillary: 209 mg/dL — ABNORMAL HIGH (ref 70–99)

## 2018-04-09 LAB — RENAL FUNCTION PANEL
ANION GAP: 15 (ref 5–15)
Albumin: 3.3 g/dL — ABNORMAL LOW (ref 3.5–5.0)
BUN: 117 mg/dL — ABNORMAL HIGH (ref 8–23)
CALCIUM: 8.2 mg/dL — AB (ref 8.9–10.3)
CO2: 21 mmol/L — AB (ref 22–32)
Chloride: 98 mmol/L (ref 98–111)
Creatinine, Ser: 6.99 mg/dL — ABNORMAL HIGH (ref 0.61–1.24)
GFR calc non Af Amer: 7 mL/min — ABNORMAL LOW (ref >60)
GFR, EST AFRICAN AMERICAN: 8 mL/min — AB (ref >60)
Glucose, Bld: 108 mg/dL — ABNORMAL HIGH (ref 70–99)
Phosphorus: 7.6 mg/dL — ABNORMAL HIGH (ref 2.5–4.6)
Potassium: 5.1 mmol/L (ref 3.5–5.1)
Sodium: 134 mmol/L — ABNORMAL LOW (ref 135–145)

## 2018-04-09 LAB — HEPARIN LEVEL (UNFRACTIONATED): Heparin Unfractionated: 0.4 IU/mL (ref 0.30–0.70)

## 2018-04-09 MED ORDER — ORAL CARE MOUTH RINSE
15.0000 mL | Freq: Two times a day (BID) | OROMUCOSAL | Status: DC
  Administered 2018-04-09 – 2018-04-11 (×6): 15 mL via OROMUCOSAL

## 2018-04-09 MED ORDER — LORATADINE 10 MG PO TABS
10.0000 mg | ORAL_TABLET | Freq: Every day | ORAL | Status: DC
  Administered 2018-04-09 – 2018-04-11 (×3): 10 mg via ORAL
  Filled 2018-04-09 (×3): qty 1

## 2018-04-09 NOTE — Progress Notes (Signed)
ANTICOAGULATION CONSULT NOTE - Follow Up Consult  Pharmacy Consult for Heparin Indication: atrial fibrillation  Allergies  Allergen Reactions  . Metolazone     Per pt family, pt gets delirious and confused when taking metolazone    Patient Measurements: Height: 6\' 2"  (188 cm) Weight: 254 lb 3.1 oz (115.3 kg) IBW/kg (Calculated) : 82.2 Heparin Dosing Weight:    Vital Signs: Temp: 97.5 F (36.4 C) (06/29 0818) Temp Source: Oral (06/29 0818) BP: 110/64 (06/29 0818) Pulse Rate: 72 (06/29 0818)  Labs: Recent Labs    04/06/18 2226  04/07/18 0645 04/07/18 0959 04/07/18 1508 04/08/18 0616 04/09/18 0800  HGB  --    < > 13.6  --   --  12.9* 12.0*  HCT  --   --  43.3  --   --  42.8 39.3  PLT  --   --  245  --   --  198 187  LABPROT 19.7*  --  19.1*  --   --   --   --   INR 1.69  --  1.62  --   --   --   --   HEPARINUNFRC  --   --   --  0.41  --  0.47 0.40  CREATININE  --   --  6.51*  --  6.44* 6.49* 6.99*   < > = values in this interval not displayed.    Estimated Creatinine Clearance: 12.9 mL/min (A) (by C-G formula based on SCr of 6.99 mg/dL (H)).   Assessment:  Anticoag: Afib on warfarin PTA, INR >10 s/p Vit K 5mg  IV 6/26. Heparin bridge - INR 1.62 (6/27) HL 0.4, Hgb 12 down some (baseline 13.9). Plts 187 down slightly (baseline  231)  Goal of Therapy:  Heparin level 0.3-0.7 units/ml Monitor platelets by anticoagulation protocol: Yes   Plan:  IV heparin 1400 units/hr Daily HL and CBC  Anylah Scheib S. Alford Highland, PharmD, BCPS Clinical Staff Pharmacist Pager [phone]  Eilene Ghazi Stillinger 04/09/2018,9:24 AM

## 2018-04-09 NOTE — Progress Notes (Signed)
Progress Note  Patient Name: Christian Garner Date of Encounter: 04/09/2018  Primary Cardiologist: Dr Haroldine Laws  Subjective   No chest pain or dyspnea  Inpatient Medications    Scheduled Meds: . amiodarone  200 mg Oral Daily  . calcium acetate  667 mg Oral TID WC  . feeding supplement (NEPRO CARB STEADY)  237 mL Oral BID BM  . ferrous sulfate  325 mg Oral Q breakfast  . insulin aspart  0-9 Units Subcutaneous TID WC  . loratadine  10 mg Oral Daily  . mouth rinse  15 mL Mouth Rinse BID  . midodrine  10 mg Oral TID WC  . multivitamin with minerals  1 tablet Oral Daily  . patiromer  16.8 g Oral Daily  . sodium bicarbonate  1,300 mg Oral BID   Continuous Infusions: . sodium chloride 10 mL/hr at 04/09/18 0523  . albumin human 25 g (04/09/18 3810)  . cefTRIAXone (ROCEPHIN)  IV Stopped (04/08/18 1324)  . furosemide Stopped (04/09/18 0630)  . heparin 1,400 Units/hr (04/08/18 2219)   PRN Meds: sodium chloride, acetaminophen **OR** acetaminophen, ondansetron (ZOFRAN) IV   Vital Signs    Vitals:   04/08/18 2000 04/09/18 0018 04/09/18 0418 04/09/18 0818  BP: 106/63 99/60 110/60 110/64  Pulse: (!) 56 62 (!) 52 72  Resp: 18 18 16 19   Temp:  (!) 97.3 F (36.3 C)  (!) 97.5 F (36.4 C)  TempSrc:  Rectal  Oral  SpO2: 93% 92% 92% 91%  Weight:      Height:        Intake/Output Summary (Last 24 hours) at 04/09/2018 1156 Last data filed at 04/09/2018 0900 Gross per 24 hour  Intake 1252.64 ml  Output 200 ml  Net 1052.64 ml   Filed Weights   04/01/2018 1852 04/06/18 0500 04/07/18 0518  Weight: 232 lb (105.2 kg) 246 lb 14.6 oz (112 kg) 254 lb 3.1 oz (115.3 kg)    Telemetry    Sinus - Personally Reviewed  Physical Exam   GEN: No acute distress. WD obese  Neck: supple Cardiac: RRR Respiratory: Diminished BS bases GI: Soft, distended MS: 3+ edema Neuro:  Nonfocal    Labs    Chemistry Recent Labs  Lab 03/30/2018 1934 04/06/18 0121  04/07/18 1508 04/08/18 0616  04/09/18 0800  NA 129* 135   < > 132* 133* 134*  K 6.3* 6.0*   < > 6.2* 5.1 5.1  CL 96* 103   < > 97* 100 98  CO2 19* 20*   < > 22 19* 21*  GLUCOSE 149* 94   < > 127* 88 108*  BUN 127* 119*   < > 119* 119* 117*  CREATININE 6.69* 6.24*   < > 6.44* 6.49* 6.99*  CALCIUM 8.0* 7.5*   < > 8.0* 7.9* 8.2*  PROT 6.8 6.3*  --   --   --   --   ALBUMIN 2.8* 2.5*   < > 2.5* 2.3* 3.3*  AST 21 19  --   --   --   --   ALT 25 26  --   --   --   --   ALKPHOS 109 99  --   --   --   --   BILITOT 0.6 0.7  --   --   --   --   GFRNONAA 7* 8*   < > 8* 8* 7*  GFRAA 9* 9*   < > 9* 9* 8*  ANIONGAP 14  12   < > 13 14 15    < > = values in this interval not displayed.     Hematology Recent Labs  Lab 04/07/18 0645 04/08/18 0616 04/09/18 0800  WBC 12.0* 11.7* 11.1*  RBC 4.47 4.33 4.00*  HGB 13.6 12.9* 12.0*  HCT 43.3 42.8 39.3  MCV 96.9 98.8 98.3  MCH 30.4 29.8 30.0  MCHC 31.4 30.1 30.5  RDW 16.8* 17.0* 17.0*  PLT 245 198 187    Cardiac Enzymes Recent Labs  Lab 04/06/18 0121  TROPONINI 0.04*     BNP Recent Labs  Lab 04/06/18 1415  BNP 1,115.5*     Patient Profile     72 year old male with past medical history of end-stage chronic systolic congestive heart failure, coronary artery disease, chronic stage III kidney disease, paroxysmal atrial fibrillation, diabetes mellitus for evaluation of acute on chronic systolic congestive heart failure.  Last echocardiogram June 2018 showed ejection fraction 30 to 35%, severe left ventricular hypertrophy, mild to moderate mitral regurgitation and biatrial enlargement.  There was note of right ventricular enlargement and findings suggestive of cor pulmonale.  Patient previously seen by Dr. Haroldine Laws and felt to have end-stage systolic congestive heart failure.  He apparently failed milrinone during a previous hospitalization and is not felt to be a candidate for advanced therapies because of age and comorbidities.  It was felt palliative care/hospice was  indicated.  He was also seen by Dr. Stann Mainland at [facility] and felt not to be a candidate for advanced therapies.  He has been treated with diuretics for symptomatic relief.  He was admitted on June 25 with generalized weakness, mild dyspnea and worsening lower extremity edema.  He also had failure to thrive.  He was found to have acute on chronic renal failure with creatinine 6.69 and potassium 6.3.  Urinalysis showed Klebsiella UTI and he is also felt to have lower ext cellulitis.  He has been treated with diuretics and nephrology is following and considering dialysis. Also treated with antibiotics. Patient denies chest pain.  Cardiology now asked to evaluate.  Assessment & Plan    1 acute on chronic systolic congestive heart failure-patient remains markedly volume overloaded.  Echocardiogram shows severe LV dysfunction.  As outlined in previous notes patient has been evaluated by Dr. Haroldine Laws and Dr. Stann Mainland at [name] and he is not a candidate for advanced therapies because of age and comorbidities.  He failed milrinone in the past.  We have not seen else to offer other than Lasix at this point.  Prognosis is poor.  I explained to the patient that our options are limited but he does not seem to grasp this.  I would suggest palliative care consult.   2 acute on chronic stage III kidney disease-patient is being followed by nephrology.  This is likely a combination of low output from chronic systolic congestive heart failure, hypotension and contribution from urinary tract infection/cellulitis.    Not clear patient is a dialysis candidate.  3 lower extremity cellulitis/Klebsiella urinary tract infection-continue antibiotics.  4 history of atrial fibrillation-patient in sinus rhythm. Continue amiodarone.  Continue IV heparin.   For questions or updates, please contact [place] Please consult [email or web address] for contact info under Cardiology/STEMI.      Signed, Kirk Ruths, MD  04/09/2018,  11:56 AM

## 2018-04-09 NOTE — Progress Notes (Signed)
Pharmacist Heart Failure Core Measure Documentation  Assessment: Christian Garner has an EF documented as 15-20% on 04/08/18 by Echo.  Rationale: Heart failure patients with left ventricular systolic dysfunction (LVSD) and an EF < 40% should be prescribed an angiotensin converting enzyme inhibitor (ACEI) or angiotensin receptor blocker (ARB) at discharge unless a contraindication is documented in the medical record.  This patient is not currently on an ACEI or ARB for HF.  This note is being placed in the record in order to provide documentation that a contraindication to the use of these agents is present for this encounter.  ACE Inhibitor or Angiotensin Receptor Blocker is contraindicated (specify all that apply)  []   ACEI allergy AND ARB allergy []   Angioedema []   Moderate or severe aortic stenosis []   Hyperkalemia []   Hypotension []   Renal artery stenosis [x]   Worsening renal function, preexisting renal disease or dysfunction  Lannis Lichtenwalner S. Alford Highland, PharmD, BCPS Clinical Staff Pharmacist Pager [phone]  Eilene Ghazi Stillinger 04/09/2018 1:25 PM

## 2018-04-09 NOTE — Progress Notes (Signed)
When attempting to partially remove the rectal pouch from the patient in order to obtain a rectal temperature, the adhesive from the pouch created a small skin tear on the patient's buttocks.  This nurse used 4x4 gauze to stop the bleeding.  Rectal temp was obtained, and pouch was replaced due to patient having frequent, loose bowel movements.  Will continue to monitor.

## 2018-04-09 NOTE — Progress Notes (Signed)
[facility] PROGRESS NOTE  Christian [facility]ner NAT:[id number] DOB: [birth year] DOA: 03/12/2018  PCP: [name], MD  Brief History/Interval Summary: 72 year old Caucasian male with a past medical history of chronic systolic CHF EF known to be 30 to 35% based on echo done in June 2018, coronary artery disease status post PTCA, atrial fibrillation on Coumadin, chronic kidney disease stage III presented with worsening bilateral lower extremity swelling and cellulitis as well as generalized weakness.  Patient was noted to be hypotensive and given fluids in the ED.  Creatinine was noted to be markedly elevated at 6.69 with the hyperkalemia.  Nephrology was consulted.  Patient was hospitalized.  Reason for Visit: Acute kidney injury with hyperkalemia.  Bilateral lower extremity cellulitis.  Consultants: Nephrology.  Critical care medicine.  Cardiology.  Palliative medicine  Procedures:  Transthoracic echocardiogram Study Conclusions  - Left ventricle: The cavity size was severely dilated. Systolic   function was severely reduced. The estimated ejection fraction   was in the range of 15% to 20%. Severe diffuse hypokinesis with   no identifiable regional variations. Features are consistent with   a pseudonormal left ventricular filling pattern, with concomitant   abnormal relaxation and increased filling pressure (grade 2   diastolic dysfunction). Acoustic contrast opacification revealed   no evidence ofthrombus. - Ventricular septum: Septal motion showed paradox. The contour   showed diastolic flattening. - Mitral valve: There was moderate regurgitation directed   centrally. Diastolic regurgitation was present. - Left atrium: The atrium was severely dilated. - Right ventricle: The cavity size was severely dilated. Systolic   function was severely reduced. - Right atrium: The atrium was moderately to severely dilated. - Tricuspid valve: There was moderate regurgitation. - Pulmonary  arteries: Systolic pressure was moderately increased.   PA peak pressure: 49 mm Hg (S).  Antibiotics: Zosyn and linezolid given in the ED Zosyn was discontinued on 6/28 Ceftriaxone 6/28   Subjective/Interval History: Patient remains distracted.  Denies any worsening in his shortness of breath.  Denies any chest pain.  No nausea or vomiting.      ROS: Denies any headaches  Objective:  Vital Signs  Vitals:   04/08/18 2000 04/09/18 0018 04/09/18 0418 04/09/18 0818  BP: 106/63 99/60 110/60 110/64  Pulse: (!) 56 62 (!) 52 72  Resp: 18 18 16 19   Temp:  (!) 97.3 F (36.3 C)  (!) 97.5 F (36.4 C)  TempSrc:  Rectal  Oral  SpO2: 93% 92% 92% 91%  Weight:      Height:        Intake/Output Summary (Last 24 hours) at 04/09/2018 0945 Last data filed at 04/09/2018 0839 Gross per 24 hour  Intake 1012.64 ml  Output 200 ml  Net 812.64 ml   Filed Weights   04/05/18 1852 04/06/18 0500 04/07/18 0518  Weight: 105.2 kg (232 lb) 112 kg (246 lb 14.6 oz) 115.3 kg (254 lb 3.1 oz)    General appearance: Awake alert.  In no distress.  Distracted. Resp: Continues to have crackles bilateral bases.  Mildly tachypneic at rest.  No use of accessory muscles.  No wheezing or rhonchi.   Cardio: S1-S2 is bradycardic regular.  No S3-S4.  No obvious murmurs appreciated. GI: Abdomen remains soft.  Nontender nondistended.  Bowel sounds are present.  No masses or organomegaly.   Extremities: Continues to have bilateral lower extremity edema.  Erythematous.  However slightly better compared to before.  Covered in dressing.  Chronic skin changes is also present.  Warm to touch.   Neurologic: Distracted.  No obvious focal neurological deficits  Lab Results:  Data Reviewed: I have personally reviewed following labs and imaging studies  CBC: Recent Labs  Lab 03/21/2018 1934 04/06/18 0121 04/07/18 0645 04/08/18 0616 04/09/18 0800  WBC 11.5* 12.3* 12.0* 11.7* 11.1*  NEUTROABS 9.5* 9.6* 9.8*  --   --   HGB  13.9 13.4 13.6 12.9* 12.0*  HCT 46.2 42.8 43.3 42.8 39.3  MCV 98.9 97.5 96.9 98.8 98.3  PLT 247 231 245 198 403    Basic Metabolic Panel: Recent Labs  Lab 04/06/18 0121 04/06/18 1105 04/07/18 0645 04/07/18 1508 04/08/18 0616 04/09/18 0800  NA 135 131* 132* 132* 133* 134*  K 6.0* 6.2* 6.8* 6.2* 5.1 5.1  CL 103 100 97* 97* 100 98  CO2 20* 22 24 22  19* 21*  GLUCOSE 94 160* 118* 127* 88 108*  BUN 119* 114* 116* 119* 119* 117*  CREATININE 6.24* 5.95* 6.51* 6.44* 6.49* 6.99*  CALCIUM 7.5* 7.4* 8.1* 8.0* 7.9* 8.2*  MG  --   --  3.2*  --   --   --   PHOS 6.4*  --  6.5* 6.7* 7.5* 7.6*    GFR: Estimated Creatinine Clearance: 12.9 mL/min (A) (by C-G formula based on SCr of 6.99 mg/dL (H)).  Liver Function Tests: Recent Labs  Lab 04/01/2018 1934 04/06/18 0121 04/07/18 0645 04/07/18 1508 04/08/18 0616 04/09/18 0800  AST 21 19  --   --   --   --   ALT 25 26  --   --   --   --   ALKPHOS 109 99  --   --   --   --   BILITOT 0.6 0.7  --   --   --   --   PROT 6.8 6.3*  --   --   --   --   ALBUMIN 2.8* 2.5* 2.5* 2.5* 2.3* 3.3*    Recent Labs  Lab 03/27/2018 1934  LIPASE 47   Coagulation Profile: Recent Labs  Lab 04/06/18 0121 04/06/18 0734 04/06/18 2226 04/07/18 0645  INR >10.00* 3.37 1.69 1.62    Cardiac Enzymes: Recent Labs  Lab 04/06/18 0121  CKTOTAL 194  TROPONINI 0.04*    CBG: Recent Labs  Lab 04/08/18 0750 04/08/18 1208 04/08/18 1621 04/08/18 2140 04/09/18 0751  GLUCAP 90 136* 138* 133* 103*    Thyroid Function Tests: No results for input(s): TSH, T4TOTAL, FREET4, T3FREE, THYROIDAB in the last 72 hours.   Recent Results (from the past 240 hour(s))  Blood Culture (routine x 2)     Status: None (Preliminary result)   Collection Time: 04/01/2018  7:34 PM  Result Value Ref Range Status   Specimen Description BLOOD LEFT ANTECUBITAL  Final   Special Requests   Final    BOTTLES DRAWN AEROBIC AND ANAEROBIC Blood Culture results may not be optimal due to  an inadequate volume of blood received in culture bottles   Culture   Final    NO GROWTH 4 DAYS Performed at [facility], [place] [address], [place], [place] [address]    Report Status PENDING  Incomplete  Blood Culture (routine x 2)     Status: None (Preliminary result)   Collection Time: 03/30/2018  7:49 PM  Result Value Ref Range Status   Specimen Description BLOOD RIGHT HAND  Final   Special Requests   Final    BOTTLES DRAWN AEROBIC ONLY Blood Culture results may not be  optimal due to an inadequate volume of blood received in culture bottles   Culture   Final    NO GROWTH 4 DAYS Performed at [facility], [place] [address] [place], [place] [address]    Report Status PENDING  Incomplete  Urine culture     Status: Abnormal   Collection Time: 04/07/2018 11:48 PM  Result Value Ref Range Status   Specimen Description URINE, RANDOM  Final   Special Requests   Final    NONE Performed at [facility], [place] [address] [place], [place] [address]    Culture >=100,000 COLONIES/mL KLEBSIELLA PNEUMONIAE (A)  Final   Report Status 04/08/2018 FINAL  Final   Organism ID, Bacteria KLEBSIELLA PNEUMONIAE (A)  Final      Susceptibility   Klebsiella pneumoniae - MIC*    AMPICILLIN >=32 RESISTANT Resistant     CEFAZOLIN <=4 SENSITIVE Sensitive     CEFTRIAXONE <=1 SENSITIVE Sensitive     CIPROFLOXACIN <=0.25 SENSITIVE Sensitive     GENTAMICIN <=1 SENSITIVE Sensitive     IMIPENEM <=0.25 SENSITIVE Sensitive     NITROFURANTOIN 128 RESISTANT Resistant     TRIMETH/SULFA <=20 SENSITIVE Sensitive     AMPICILLIN/SULBACTAM 8 SENSITIVE Sensitive     PIP/TAZO <=4 SENSITIVE Sensitive     Extended ESBL NEGATIVE Sensitive     * >=100,000 COLONIES/mL KLEBSIELLA PNEUMONIAE  MRSA PCR Screening     Status: None   Collection Time: 04/06/18  2:16 AM  Result Value Ref Range Status   MRSA by PCR NEGATIVE NEGATIVE Final    Comment:        The GeneXpert MRSA Assay (FDA approved for NASAL  specimens only), is one component of a comprehensive MRSA colonization surveillance program. It is not intended to diagnose MRSA infection nor to guide or monitor treatment for MRSA infections. Performed at [facility], [place] [address], [place], [place] [address]       Radiology Studies: No results found.   Medications:  Scheduled: . amiodarone  200 mg Oral Daily  . calcium acetate  667 mg Oral TID WC  . feeding supplement (NEPRO CARB STEADY)  237 mL Oral BID BM  . ferrous sulfate  325 mg Oral Q breakfast  . insulin aspart  0-9 Units Subcutaneous TID WC  . loratadine  10 mg Oral Daily  . mouth rinse  15 mL Mouth Rinse BID  . midodrine  10 mg Oral TID WC  . multivitamin with minerals  1 tablet Oral Daily  . patiromer  16.8 g Oral Daily  . sodium bicarbonate  1,300 mg Oral BID   Continuous: . sodium chloride 10 mL/hr at 04/09/18 0523  . albumin human 25 g (04/09/18 5956)  . cefTRIAXone (ROCEPHIN)  IV Stopped (04/08/18 1324)  . furosemide Stopped (04/09/18 0630)  . heparin 1,400 Units/hr (04/08/18 2219)   LOV:FIEPPI chloride, acetaminophen **OR** acetaminophen, ondansetron (ZOFRAN) IV  Assessment/Plan:    Acute on chronic kidney disease stage III-hyperkalemia It appears that patient has been on dialysis previously but only for a brief while.  This was approximately 1 year ago.  Patient's creatinine was 1.74 in December 2018.  When he presented it was markedly elevated at greater than 6.  No hydronephrosis noted on CT scan. Nephrology was consulted.  Patient was placed on high dose of Lasix.  Echocardiogram shows worsening in his ejection fraction from 30 to 35% last year to 15-20% this year.  So his renal failure is most  likely secondary to cardiorenal.  Patient continues to have poor urine output.  Blood pressures were soft and he was started on midodrine.  Remains stable now.  Potassium level has improved.  He is on Veltassa.   Acute on chronic systolic  CHF Echocardiogram shows worsening in his systolic function.  His EF is 15 to 20%.  Appreciate cardiology input.  Patient previously seen by heart failure team here as well as at [facility].  Patient not considered a candidate for advanced therapies.  He is apparently failed milrinone previously.  Nephrology was suggesting inotrope but it does not appear to be an option now.urology has recommended palliative medicine consult for goals of care.  This has been requested.  Continue IV Lasix.  Prognosis appears to be poor.    Bilateral lower extremity edema and cellulitis Patient has skin changes suggestive of chronic venous stasis.  It looks like he developed swelling recently now with some skin breakdown and possible cellulitis.  Blood cultures remain negative so far.  MRSA PCR was negative.  Zosyn changed to ceftriaxone.  UTI with Klebsiella Unclear if he had symptoms.  However his urine cultures positive for Klebsiella.  Zosyn was changed over to ceftriaxone.    Hypotension Patient was started on midodrine.  Blood pressures have improved.   History of atrial fibrillation on anticoagulation with warfarin Supratherapeutic INR noted.  He was given vitamin K with improvement in INR.  Monitor on telemetry.  Continue amiodarone.  He is currently on IV heparin.  History of coronary artery disease Stable.  History of diabetes mellitus type 2 CBGs are stable.  Abnormal TSH TSH is noted to be 7.2.  Free T4 normal.  No known history of thyroid disease.  Will need to be repeated as outpatient.  Abnormal CT scan Multiple incidental findings noted including cholelithiasis.  He appears to be asymptomatic.  CT scan also raised concern for colitis however patient does not have any GI symptoms currently.  DVT Prophylaxis: IV heparin Code Status: Full code Family Communication: Discussed with patient.  No family at bedside. Disposition Plan: Management as outlined above.  Appreciate cardiology and nephrology  input.  Await palliative medicine input for GOC.    LOS: 4 days   [facility] Pager [phone] 04/09/2018, 9:45 AM  If 7PM-7AM, please contact night-coverage at [email or web address], password [facility]

## 2018-04-09 NOTE — Evaluation (Signed)
Physical Therapy Evaluation Patient Details Name: Christian Garner MRN: [id number] DOB: Mar 20, 1946 Today's Date: 04/09/2018   History of Present Illness  Pt is a 72 y.o. male admitted 04/10/2018 with worsening BLE swelling and generalized weakness; worked up for CHF, BLE cellulitis, AKI and UTI. PMH includes CHF, CAD, a-fib on Coumadin, CKD III, DMII.    Clinical Impression  Pt presents with an overall decrease in functional mobility secondary to above. PTA, pt mod indep with RW for limited household ambulation; lives with family available for 24/7 support. Today, pt required minA for bed mobility and repositioning; c/o significant fatigue and pain at flexiseal insertion. SpO2 82-86% on 4L O2 [place] (RN notified). Agreeable to supine BLE therex; teach back displayed after multiple repetitions of therex. Pt would benefit from continued acute PT services to maximize functional mobility and independence prior to d/c with SNF-level therapies.     Follow Up Recommendations SNF;Supervision for mobility/OOB    Equipment Recommendations  None recommended by PT    Recommendations for Other Services       Precautions / Restrictions Precautions Precautions: Fall Precaution Comments: BLE wrapped, watch O2 Restrictions Weight Bearing Restrictions: No      Mobility  Bed Mobility Overal bed mobility: Needs Assistance Bed Mobility: Rolling Rolling: Min assist         General bed mobility comments: Pt able to roll R/L with minA for repositioning to offload sacrum; heavy reliance on UE to pull on bed rails  Transfers                 General transfer comment: NT  Ambulation/Gait                Stairs            Wheelchair Mobility    Modified Rankin (Stroke Patients Only)       Balance                                             Pertinent Vitals/Pain Pain Assessment: Faces Faces Pain Scale: Hurts even more Pain Location: Buttocks at flexiseal  insertion Pain Descriptors / Indicators: Discomfort;Grimacing Pain Intervention(s): Limited activity within patient's tolerance;Repositioned    Home Living Family/patient expects to be discharged to:: Private residence Living Arrangements: Spouse/significant other Available Help at Discharge: Family;Available 24 hours/day Type of Home: House Home Access: Stairs to enter Entrance Stairs-Rails: Left Entrance Stairs-Number of Steps: 3 Home Layout: One level Home Equipment: Walker - 2 wheels;Wheelchair - manual;Bedside commode      Prior Function Level of Independence: Needs assistance   Gait / Transfers Assistance Needed: RW for mobility. Limited community ambulator; takes w/c for MD appts. Wife reports pt sits in recliner majority of day  ADL's / Homemaking Assistance Needed: Wife assisting with LB ADL. Pt only sponge bathing        Hand Dominance   Dominant Hand: Left    Extremity/Trunk Assessment   Upper Extremity Assessment Upper Extremity Assessment: Overall WFL for tasks assessed    Lower Extremity Assessment Lower Extremity Assessment: Generalized weakness       Communication   Communication: HOH  Cognition Arousal/Alertness: Awake/alert Behavior During Therapy: WFL for tasks assessed/performed Overall Cognitive Status: Within Functional Limits for tasks assessed  General Comments      Exercises General Exercises - Lower Extremity Ankle Circles/Pumps: AROM;Both;20 reps;Supine Short Arc Quad: AROM;Both;20 reps;Supine Heel Slides: AROM;Both;15 reps;Supine   Assessment/Plan    PT Assessment Patient needs continued PT services  PT Problem List Decreased strength;Decreased activity tolerance;Decreased balance;Decreased mobility;Decreased knowledge of use of DME;Cardiopulmonary status limiting activity       PT Treatment Interventions DME instruction;Gait training;Stair training;Functional mobility  training;Therapeutic activities;Therapeutic exercise;Balance training;Patient/family education;Wheelchair mobility training    PT Goals (Current goals can be found in the Care Plan section)  Acute Rehab PT Goals Patient Stated Goal: Rest  PT Goal Formulation: With patient Time For Goal Achievement: 04/23/18 Potential to Achieve Goals: Good    Frequency Min 2X/week   Barriers to discharge        Co-evaluation               AM-PAC PT "6 Clicks" Daily Activity  Outcome Measure Difficulty turning over in bed (including adjusting bedclothes, sheets and blankets)?: Unable Difficulty moving from lying on back to sitting on the side of the bed? : Unable Difficulty sitting down on and standing up from a chair with arms (e.g., wheelchair, bedside commode, etc,.)?: Unable Help needed moving to and from a bed to chair (including a wheelchair)?: A Lot Help needed walking in hospital room?: Total Help needed climbing 3-5 steps with a railing? : Total 6 Click Score: 7    End of Session Equipment Utilized During Treatment: Oxygen Activity Tolerance: Patient limited by fatigue;Patient limited by pain Patient left: in bed;with call bell/phone within reach;with bed alarm set;with family/visitor present Nurse Communication: Mobility status PT Visit Diagnosis: Other abnormalities of gait and mobility (R26.89)    Time: 1335-1400 PT Time Calculation (min) (ACUTE ONLY): 25 min   Charges:   PT Evaluation $PT Eval Moderate Complexity: 1 Mod PT Treatments $Therapeutic Exercise: 8-22 mins   PT G Codes:       Mabeline Caras, PT, DPT Acute Rehab Services  Pager: [place] 04/09/2018, 3:18 PM

## 2018-04-09 NOTE — Progress Notes (Addendum)
[facility] NEPHROLOGY PROGRESS NOTE  Assessment/ Plan: Pt is a 72 y.o. yo male A. fib on Coumadin, type 2 diabetes with foot ulcer, coronary artery disease status post stent, CHF with EF of 30 to 35%, CKD 3 with baseline serum creatinine around 2 admitted with generalized weakness and worsening lower extremity cellulitis.  Consulted for hyperkalemia and worsening renal failure.  Assessment/Plan:  #Acute kidney injury on CKD stage III: Multifactorial etiology including cardiorenal syndrome and sepsis. The urinalysis with protein, RBC, WBC and bacteria. Urine PCR 0.9.  Ultrasound kidney with bilateral renal cortical thinning. -The dose of Lasix increased to 120 mg IV every 8 hourly with no significant improvement in urine output.  Serum creatinine level continued to worsen to 6.9.  Echo with EF of around 15%.  I recommend inotropes support to help further diuresis and renal recovery.  He is still fluid overload.  No improvement with IV albumin. -Cardiology note noted, patient with end-stage CHF and failed milrinone during previous hospitalization.  In this case the prognosis is very poor and may consider palliative care evaluation.  I have discussed this with Dr. [place] Pink from [name].  -Patient had AKI in 12/2016 requiring CRRT when complements, anti-GBM, ANCA were negative. I discussed with the patient regarding worsening renal failure.  He understand that he may need dialysis if his kidney function continued to worsen.  He may not tolerate outpatient dialysis.  #Hyperkalemia: Due to renal failure and was on potassium chloride at home.  Serum potassium level 5.1 today.  Continue Veltassa.  The higher dose of IV Lasix should help to excrete potassium.      #Metabolic acidosis: Started sodium bicarbonate.  #Systolic CHF with EF of 70% grade 2 diastolic dysfunction.  # Hypotension: due to infection.  Started midodrine.  Monitor blood pressure.  Subjective: Seen and examined at  bedside.  He denies any nausea vomiting chest pain or shortness of breath.  Objective Vital signs in last 24 hours: Vitals:   04/08/18 2000 04/09/18 0018 04/09/18 0418 04/09/18 0818  BP: 106/63 99/60 110/60 110/64  Pulse: (!) 56 62 (!) 52 72  Resp: 18 18 16 19   Temp:  (!) 97.3 F (36.3 C)  (!) 97.5 F (36.4 C)  TempSrc:  Rectal  Oral  SpO2: 93% 92% 92% 91%  Weight:      Height:       Weight change:   Intake/Output Summary (Last 24 hours) at 04/09/2018 1200 Last data filed at 04/09/2018 0900 Gross per 24 hour  Intake 1252.64 ml  Output 200 ml  Net 1052.64 ml       Labs: Basic Metabolic Panel: Recent Labs  Lab 04/07/18 1508 04/08/18 0616 04/09/18 0800  NA 132* 133* 134*  K 6.2* 5.1 5.1  CL 97* 100 98  CO2 22 19* 21*  GLUCOSE 127* 88 108*  BUN 119* 119* 117*  CREATININE 6.44* 6.49* 6.99*  CALCIUM 8.0* 7.9* 8.2*  PHOS 6.7* 7.5* 7.6*   Liver Function Tests: Recent Labs  Lab 03/25/2018 1934 04/06/18 0121  04/07/18 1508 04/08/18 0616 04/09/18 0800  AST 21 19  --   --   --   --   ALT 25 26  --   --   --   --   ALKPHOS 109 99  --   --   --   --   BILITOT 0.6 0.7  --   --   --   --   PROT 6.8 6.3*  --   --   --   --  ALBUMIN 2.8* 2.5*   < > 2.5* 2.3* 3.3*   < > = values in this interval not displayed.   Recent Labs  Lab 03/31/2018 1934  LIPASE 47   No results for input(s): AMMONIA in the last 168 hours. CBC: Recent Labs  Lab 04/01/2018 1934 04/06/18 0121 04/07/18 0645 04/08/18 0616 04/09/18 0800  WBC 11.5* 12.3* 12.0* 11.7* 11.1*  NEUTROABS 9.5* 9.6* 9.8*  --   --   HGB 13.9 13.4 13.6 12.9* 12.0*  HCT 46.2 42.8 43.3 42.8 39.3  MCV 98.9 97.5 96.9 98.8 98.3  PLT 247 231 245 198 187   Cardiac Enzymes: Recent Labs  Lab 04/06/18 0121  CKTOTAL 194  TROPONINI 0.04*   CBG: Recent Labs  Lab 04/08/18 0750 04/08/18 1208 04/08/18 1621 04/08/18 2140 04/09/18 0751  GLUCAP 90 136* 138* 133* 103*    Iron Studies: No results for input(s): IRON,  TIBC, TRANSFERRIN, FERRITIN in the last 72 hours. Studies/Results: No results found.  Medications: Infusions: . sodium chloride 10 mL/hr at 04/09/18 0523  . albumin human 25 g (04/09/18 4944)  . cefTRIAXone (ROCEPHIN)  IV Stopped (04/08/18 1324)  . furosemide Stopped (04/09/18 0630)  . heparin 1,400 Units/hr (04/08/18 2219)    Scheduled Medications: . amiodarone  200 mg Oral Daily  . calcium acetate  667 mg Oral TID WC  . feeding supplement (NEPRO CARB STEADY)  237 mL Oral BID BM  . ferrous sulfate  325 mg Oral Q breakfast  . insulin aspart  0-9 Units Subcutaneous TID WC  . loratadine  10 mg Oral Daily  . mouth rinse  15 mL Mouth Rinse BID  . midodrine  10 mg Oral TID WC  . multivitamin with minerals  1 tablet Oral Daily  . patiromer  16.8 g Oral Daily  . sodium bicarbonate  1,300 mg Oral BID    have reviewed scheduled and prn medications.  Physical Exam: General: Not in distress, speaking full sentence Heart: Regular rate rhythm S1-S2 normal.  No rub Lungs: Bilateral coarse breath sounds with occasional expiratory wheeze Abdomen:soft, Non-tender, non-distended Extremities: Lower extremity cellulitis and edema.  No improvement Neurology: Alert, awake and following commands. Dron Prasad Bhandari 04/09/2018,12:00 PM  LOS: 4 days

## 2018-04-10 DIAGNOSIS — Z515 Encounter for palliative care: Secondary | ICD-10-CM

## 2018-04-10 DIAGNOSIS — R0602 Shortness of breath: Secondary | ICD-10-CM

## 2018-04-10 DIAGNOSIS — Z7189 Other specified counseling: Secondary | ICD-10-CM

## 2018-04-10 LAB — HEPARIN LEVEL (UNFRACTIONATED): HEPARIN UNFRACTIONATED: 0.44 [IU]/mL (ref 0.30–0.70)

## 2018-04-10 LAB — CULTURE, BLOOD (ROUTINE X 2)
Culture: NO GROWTH
Culture: NO GROWTH

## 2018-04-10 LAB — RENAL FUNCTION PANEL
ANION GAP: 15 (ref 5–15)
Albumin: 3.2 g/dL — ABNORMAL LOW (ref 3.5–5.0)
BUN: 118 mg/dL — AB (ref 8–23)
CO2: 21 mmol/L — ABNORMAL LOW (ref 22–32)
Calcium: 8.3 mg/dL — ABNORMAL LOW (ref 8.9–10.3)
Chloride: 96 mmol/L — ABNORMAL LOW (ref 98–111)
Creatinine, Ser: 7.6 mg/dL — ABNORMAL HIGH (ref 0.61–1.24)
GFR calc Af Amer: 7 mL/min — ABNORMAL LOW (ref >60)
GFR, EST NON AFRICAN AMERICAN: 6 mL/min — AB (ref >60)
Glucose, Bld: 111 mg/dL — ABNORMAL HIGH (ref 70–99)
PHOSPHORUS: 7.9 mg/dL — AB (ref 2.5–4.6)
POTASSIUM: 5 mmol/L (ref 3.5–5.1)
Sodium: 132 mmol/L — ABNORMAL LOW (ref 135–145)

## 2018-04-10 LAB — CBC
HCT: 40.7 % (ref 39.0–52.0)
HEMOGLOBIN: 12.5 g/dL — AB (ref 13.0–17.0)
MCH: 30 pg (ref 26.0–34.0)
MCHC: 30.7 g/dL (ref 30.0–36.0)
MCV: 97.8 fL (ref 78.0–100.0)
PLATELETS: 212 10*3/uL (ref 150–400)
RBC: 4.16 MIL/uL — AB (ref 4.22–5.81)
RDW: 16.9 % — ABNORMAL HIGH (ref 11.5–15.5)
WBC: 13.1 10*3/uL — ABNORMAL HIGH (ref 4.0–10.5)

## 2018-04-10 LAB — GLUCOSE, CAPILLARY
GLUCOSE-CAPILLARY: 106 mg/dL — AB (ref 70–99)
GLUCOSE-CAPILLARY: 133 mg/dL — AB (ref 70–99)
Glucose-Capillary: 144 mg/dL — ABNORMAL HIGH (ref 70–99)
Glucose-Capillary: 155 mg/dL — ABNORMAL HIGH (ref 70–99)

## 2018-04-10 MED ORDER — SALINE SPRAY 0.65 % NA SOLN
1.0000 | NASAL | Status: DC | PRN
  Filled 2018-04-10: qty 44

## 2018-04-10 MED ORDER — HYDROMORPHONE HCL 1 MG/ML IJ SOLN
0.2500 mg | INTRAMUSCULAR | Status: DC | PRN
  Administered 2018-04-10 – 2018-04-11 (×4): 0.25 mg via INTRAVENOUS
  Filled 2018-04-10 (×5): qty 0.5

## 2018-04-10 NOTE — Consult Note (Signed)
Consultation Note Date: 04/10/2018   Patient Name: Christian Garner  DOB: Jul 15, 1946  MRN: [id number]  Age / Sex: 72 y.o., male  PCP: [name], MD Referring Physician: Bonnielee Haff, MD  Reason for Consultation: Establishing goals of care, Hospice Evaluation and Psychosocial/spiritual support  HPI/Patient Profile: 72 y.o. male  with past medical history of systolic CHF (EF in 9675 91-63%), CAD s/p stent, CKD3, a fib on coumadin, and T2DM with a foot ulcer admitted on 04/04/2018 with worsening bilateral lower extremity edema and weakness. Found to have AKI and UTI. Baseline creatinine is around 2 - currently creatinine is 7.6 and potassium is 5. Per nephrology, AKI is d/t cardiorenal syndrome and sepsis. Also has bilateral lower extremity cellulitis. This admission echo showed worsening EF now at 15-20%. He has been evaluated at [facility] and is not a candidate for advanced therapy due to age and comorbidities. He has previously failed milrinone. He is now on IV lasix - has been increased - with no improvement. Nephrology considering dialysis but he may not tolerate, is poor candidate. PMT consulted for goals of care.  Clinical Assessment and Goals of Care: I have reviewed medical records including EPIC notes, labs and imaging, received report from RN, assessed the patient and then met at the bedside with patient  to discuss diagnosis prognosis, GOC, EOL wishes, disposition and options.  I called patient's wife to join [place] for meeting but she is not available today d/t transportation issues. Plans to be here tomorrow for [place] meeting at 2 or 3 pm 7/1. I did speak with her on the phone regarding the following.  I introduced Palliative Medicine as specialized medical care for people living with serious illness. It focuses on providing relief from the symptoms and stress of a serious illness. The goal is to improve quality of life for both the patient and the  family.  We discussed a brief life review of the patient. Patient and his wife have been married 71 years. They have 3 children, 5 grandchildren, and 3 great grandchildren. The patient worked for [employer]. She tells me about when they met. They lived in [place] prior to [place].   As far as functional and nutritional status, he requires a walker to ambulate. Unable to take showers/baths - requires assistance with sponge bath. Unable to use bathroom independently. Usually has a good appetite until a few days prior to this hospitalization. She tells me he is usually oriented but occasionally becomes confused - easily reoriented.    We discussed his current illness and what it means in the larger context of his on-going co-morbidities.  Natural disease trajectory and expectations at EOL were discussed. Patient and wife both verbalized that he is "dying". Both expressed they knew this was coming as he had been declining. We discussed kidney and heart failure at length and answered all questions.   I attempted to elicit values and goals of care important to the patient.    The difference between aggressive medical intervention and comfort care was considered in light of the patient's goals of care. They understand there are not any aggressive medical interventions left to offer for his heart failure. We discussed focusing on comfort. Will discuss further during [place] meeting tomorrow.   Advanced directives, concepts specific to code status, artifical feeding and hydration, and rehospitalization were considered and discussed. Patient tells me he would never want to be on a ventilator or be resuscitated. Discussed that DNR is most appropriate in setting of  end stage heart and kidney failure. Wife and husband agree. He tells me he wants to be a DNR. He would not want his life artificially prolonged. He wants to be allowed a natural, comfortable death.  Wife is saddened during our discussion but appreciative of  information and space to talk about her husband. She tells me they have discussed together that he is nearing the end of life and he told her to "live on"; she expresses her sadness about imagining life without him.   Hospice and Palliative Care services outpatient were explained and offered. Wife tells me that she cannot take patient home - he is too weak and frail. We discussed that he may be eligible for hospice facility. She wants to think about this and we will discuss further tomorrow. She requests we not use the word hospice in front of her husband.   Questions and concerns were addressed. The family was encouraged to call with questions or concerns.   Primary Decision Maker PATIENT joined by his wife   SUMMARY OF RECOMMENDATIONS   Code status changed to DNR Medications added for dyspnea Patient and wife appear to understand the gravity of the situation - they both verbalize he is "dying" Will follow up with wife tomorrow and discuss plan moving forward - hospice facility and comfort care?  Code Status/Advance Care Planning:  DNR  Symptom Management:   Dilaudid IV 0.25 mg q2hr PRN dyspnea or pain - can titrate up if ineffective  Palliative Prophylaxis:   Aspiration, Delirium Protocol, Frequent Pain Assessment, Oral Care and Turn Reposition  Psycho-social/Spiritual:   Desire for further Chaplaincy support:no  Additional Recommendations: Education on Hospice  Prognosis:   < 2 weeks  Discharge Planning: To Be Determined - will discuss residential hospice with wife tomorrow     Primary Diagnoses: Present on Admission: . ARF (acute renal failure) ([place]) . Bilateral cellulitis of lower leg . AF (paroxysmal atrial fibrillation) ([place]) . Type 2 diabetes mellitus with left diabetic foot ulcer ([place]) . Chronic systolic CHF (congestive heart failure) ([place]) . Acute renal failure (ARF) ([place])   I have reviewed the medical record, interviewed the patient and family, and  examined the patient. The following aspects are pertinent.  Past Medical History:  Diagnosis Date  . ARF (acute renal failure) ([place]) 04/06/2018  . Arthritis    "left hand" (04/06/2018)  . CAD (coronary artery disease)   . Cellulitis and abscess of lower extremity   . CHF (congestive heart failure) ([facility])    EF 30%  . Chronic kidney disease, stage III (moderate) ([facility])   . History of kidney stones   . Pneumonia    "a couple times" (04/06/2018)  . Recurrent Clostridium difficile diarrhea 09/27/2017  . Type 2 diabetes, diet controlled ([place])    Social History   Socioeconomic History  . Marital status: Married    Spouse name: Not on file  . Number of children: Not on file  . Years of education: Not on file  . Highest education level: Not on file  Occupational History  . Not on file  Social Needs  . Financial resource strain: Not on file  . Food insecurity:    Worry: Not on file    Inability: Not on file  . Transportation needs:    Medical: Not on file    Non-medical: Not on file  Tobacco Use  . Smoking status: Former Smoker    Packs/day: 1.00    Years: 18.00    Pack years:  18.00    Types: Cigarettes    Last attempt to quit: 1982    Years since quitting: 37.5  . Smokeless tobacco: Never Used  Substance and Sexual Activity  . Alcohol use: Not Currently  . Drug use: Never  . Sexual activity: Not Currently  Lifestyle  . Physical activity:    Days per week: Not on file    Minutes per session: Not on file  . Stress: Not on file  Relationships  . Social connections:    Talks on phone: Not on file    Gets together: Not on file    Attends religious service: Not on file    Active member of club or organization: Not on file    Attends meetings of clubs or organizations: Not on file    Relationship status: Not on file  Other Topics Concern  . Not on file  Social History Narrative   Patient is cared for primarily by his wife at home   Family History  Problem Relation Age  of Onset  . Hypertension Mother   . Diabetes Mother   . Bone cancer Mother   . Hypertension Father   . Bladder Cancer Sister    Scheduled Meds: . amiodarone  200 mg Oral Daily  . calcium acetate  667 mg Oral TID WC  . feeding supplement (NEPRO CARB STEADY)  237 mL Oral BID BM  . ferrous sulfate  325 mg Oral Q breakfast  . insulin aspart  0-9 Units Subcutaneous TID WC  . loratadine  10 mg Oral Daily  . mouth rinse  15 mL Mouth Rinse BID  . midodrine  10 mg Oral TID WC  . multivitamin with minerals  1 tablet Oral Daily  . patiromer  16.8 g Oral Daily  . sodium bicarbonate  1,300 mg Oral BID   Continuous Infusions: . sodium chloride 10 mL/hr at 04/09/18 1709  . cefTRIAXone (ROCEPHIN)  IV 1 g (04/09/18 1443)  . furosemide 120 mg (04/10/18 0540)  . heparin 1,400 Units/hr (04/10/18 0948)   PRN Meds:.sodium chloride, acetaminophen **OR** acetaminophen, ondansetron (ZOFRAN) IV, sodium chloride Allergies  Allergen Reactions  . Metolazone     Per pt family, pt gets delirious and confused when taking metolazone   Review of Systems  Constitutional: Positive for activity change, appetite change and fatigue.  Respiratory: Positive for shortness of breath.   Cardiovascular: Positive for leg swelling.  Gastrointestinal: Positive for abdominal distention.  Genitourinary: Positive for penile pain.       From catheter    Physical Exam  Constitutional: He is oriented to person, place, and time. He has a sickly appearance. He appears distressed.  HENT:  Head: Normocephalic and atraumatic.  Cardiovascular: Normal rate, regular rhythm and intact distal pulses.  Pulmonary/Chest: Accessory muscle usage present. Tachypnea noted. He has decreased breath sounds.  Abdominal: He exhibits distension.  Genitourinary:  Genitourinary Comments: Small amount of urine in foley bag  Neurological: He is alert and oriented to person, place, and time.  Yelling "help" when I walked into room  Skin: Skin is  warm and dry. There is pallor.    Vital Signs: BP 110/60   Pulse 60   Temp 97.7 F (36.5 C) (Oral)   Resp 15   Ht 6' 2"  (1.88 m)   Wt 115.8 kg (255 lb 4.7 oz)   SpO2 91% Comment: 4LNC  BMI 32.78 kg/m  Pain Scale: 0-10   Pain Score: 0-No pain   SpO2: SpO2: 91 %(4LNC)  O2 Device:SpO2: 91 %(4LNC) O2 Flow Rate: .O2 Flow Rate (L/min): 4 L/min  IO: Intake/output summary:   Intake/Output Summary (Last 24 hours) at 04/10/2018 1110 Last data filed at 04/10/2018 0500 Gross per 24 hour  Intake 3695.22 ml  Output 100 ml  Net 3595.22 ml    LBM: Last BM Date: 04/10/18 Baseline Weight: Weight: 105.2 kg (232 lb) Most recent weight: Weight: 115.8 kg (255 lb 4.7 oz)     Palliative Assessment/Data: PPS 30%      Time Total: 85 minutes Greater than 50%  of this time was spent counseling and coordinating care related to the above assessment and plan.  Juel Burrow, DNP, AGNP-C Palliative Medicine Team [phone] Pager: [phone]

## 2018-04-10 NOTE — Progress Notes (Signed)
[facility] PROGRESS NOTE  [name] WUJ:[id number] DOB: September 27, 1946 DOA: 03/23/2018  PCP: Christian Flow, MD  Brief History/Interval Summary: 72 year old Caucasian male with a past medical history of chronic systolic CHF EF known to be 30 to 35% based on echo done in June 2018, coronary artery disease status post PTCA, atrial fibrillation on Coumadin, chronic kidney disease stage III presented with worsening bilateral lower extremity swelling and cellulitis as well as generalized weakness.  Patient was noted to be hypotensive and given fluids in the ED.  Creatinine was noted to be markedly elevated at 6.69 with the hyperkalemia.  Nephrology was consulted.  Patient was hospitalized.  Reason for Visit: Acute kidney injury with hyperkalemia.  Bilateral lower extremity cellulitis.  Consultants: Nephrology.  Critical care medicine.  Cardiology.  Palliative medicine  Procedures:  Transthoracic echocardiogram Study Conclusions  - Left ventricle: The cavity size was severely dilated. Systolic   function was severely reduced. The estimated ejection fraction   was in the range of 15% to 20%. Severe diffuse hypokinesis with   no identifiable regional variations. Features are consistent with   a pseudonormal left ventricular filling pattern, with concomitant   abnormal relaxation and increased filling pressure (grade 2   diastolic dysfunction). Acoustic contrast opacification revealed   no evidence ofthrombus. - Ventricular septum: Septal motion showed paradox. The contour   showed diastolic flattening. - Mitral valve: There was moderate regurgitation directed   centrally. Diastolic regurgitation was present. - Left atrium: The atrium was severely dilated. - Right ventricle: The cavity size was severely dilated. Systolic   function was severely reduced. - Right atrium: The atrium was moderately to severely dilated. - Tricuspid valve: There was moderate regurgitation. - Pulmonary  arteries: Systolic pressure was moderately increased.   PA peak pressure: 49 mm Hg (S).  Antibiotics: Zosyn and linezolid given in the ED Zosyn was discontinued on 6/28 Ceftriaxone 6/28   Subjective/Interval History: Patient remains distracted.  States that his breathing is stable.  Denies any worsening shortness of breath.  No chest pain.      ROS: Denies any nausea or vomiting  Objective:  Vital Signs  Vitals:   04/09/18 2018 04/10/18 0148 04/10/18 0500 04/10/18 0800  BP: (!) 116/59 110/60    Pulse: (!) 54 (!) 52  60  Resp: 17 15    Temp:    97.7 F (36.5 C)  TempSrc:    Oral  SpO2: 91% 93%  91%  Weight:   115.8 kg (255 lb 4.7 oz)   Height:        Intake/Output Summary (Last 24 hours) at 04/10/2018 0946 Last data filed at 04/10/2018 0500 Gross per 24 hour  Intake 3695.22 ml  Output 100 ml  Net 3595.22 ml   Filed Weights   04/06/18 0500 04/07/18 0518 04/10/18 0500  Weight: 112 kg (246 lb 14.6 oz) 115.3 kg (254 lb 3.1 oz) 115.8 kg (255 lb 4.7 oz)    General appearance: Awake alert.  Distracted.  In no distress. Resp: Mildly tachypneic at rest.  No use of accessory muscles.  Crackles at the bases.  No wheezing or rhonchi.   Cardio: S1-S2 is normal regular.  Bradycardia appears to have improved.  No S3-S4.  No rubs murmurs or bruit GI: Abdomen remains soft.  Nontender nondistended.  Bowel sounds are present.  No masses organomegaly Extremities: Lower extremity edema is slightly better.  Remains erythematous.  Covered in dressing.    Neurologic: Distracted.  No obvious focal  neurological deficits  Lab Results:  Data Reviewed: I have personally reviewed following labs and imaging studies  CBC: Recent Labs  Lab 03/18/2018 1934 04/06/18 0121 04/07/18 0645 04/08/18 0616 04/09/18 0800 04/10/18 0432  WBC 11.5* 12.3* 12.0* 11.7* 11.1* 13.1*  NEUTROABS 9.5* 9.6* 9.8*  --   --   --   HGB 13.9 13.4 13.6 12.9* 12.0* 12.5*  HCT 46.2 42.8 43.3 42.8 39.3 40.7  MCV 98.9  97.5 96.9 98.8 98.3 97.8  PLT 247 231 245 198 187 161    Basic Metabolic Panel: Recent Labs  Lab 04/07/18 0645 04/07/18 1508 04/08/18 0616 04/09/18 0800 04/10/18 0432  NA 132* 132* 133* 134* 132*  K 6.8* 6.2* 5.1 5.1 5.0  CL 97* 97* 100 98 96*  CO2 24 22 19* 21* 21*  GLUCOSE 118* 127* 88 108* 111*  BUN 116* 119* 119* 117* 118*  CREATININE 6.51* 6.44* 6.49* 6.99* 7.60*  CALCIUM 8.1* 8.0* 7.9* 8.2* 8.3*  MG 3.2*  --   --   --   --   PHOS 6.5* 6.7* 7.5* 7.6* 7.9*    GFR: Estimated Creatinine Clearance: 11.9 mL/min (A) (by C-G formula based on SCr of 7.6 mg/dL (H)).  Liver Function Tests: Recent Labs  Lab 04/06/2018 1934 04/06/18 0121 04/07/18 0645 04/07/18 1508 04/08/18 0616 04/09/18 0800 04/10/18 0432  AST 21 19  --   --   --   --   --   ALT 25 26  --   --   --   --   --   ALKPHOS 109 99  --   --   --   --   --   BILITOT 0.6 0.7  --   --   --   --   --   PROT 6.8 6.3*  --   --   --   --   --   ALBUMIN 2.8* 2.5* 2.5* 2.5* 2.3* 3.3* 3.2*    Recent Labs  Lab 03/27/2018 1934  LIPASE 47   Coagulation Profile: Recent Labs  Lab 04/06/18 0121 04/06/18 0734 04/06/18 2226 04/07/18 0645  INR >10.00* 3.37 1.69 1.62    Cardiac Enzymes: Recent Labs  Lab 04/06/18 0121  CKTOTAL 194  TROPONINI 0.04*    CBG: Recent Labs  Lab 04/09/18 0751 04/09/18 1209 04/09/18 1620 04/09/18 2113 04/10/18 0751  GLUCAP 103* 117* 140* 209* 106*    Thyroid Function Tests: No results for input(s): TSH, T4TOTAL, FREET4, T3FREE, THYROIDAB in the last 72 hours.   Recent Results (from the past 240 hour(s))  Blood Culture (routine x 2)     Status: None (Preliminary result)   Collection Time: 04/06/2018  7:34 PM  Result Value Ref Range Status   Specimen Description BLOOD LEFT ANTECUBITAL  Final   Special Requests   Final    BOTTLES DRAWN AEROBIC AND ANAEROBIC Blood Culture results may not be optimal due to an inadequate volume of blood received in culture bottles   Culture    Final    NO GROWTH 4 DAYS Performed at [facility], [place] [address], [place], [place] [address]    Report Status PENDING  Incomplete  Blood Culture (routine x 2)     Status: None (Preliminary result)   Collection Time: 03/13/2018  7:49 PM  Result Value Ref Range Status   Specimen Description BLOOD RIGHT HAND  Final   Special Requests   Final    BOTTLES DRAWN AEROBIC ONLY Blood Culture results may not  be optimal due to an inadequate volume of blood received in culture bottles   Culture   Final    NO GROWTH 4 DAYS Performed at [facility], [place] [address], [place], [place] [address]    Report Status PENDING  Incomplete  Urine culture     Status: Abnormal   Collection Time: 03/27/2018 11:48 PM  Result Value Ref Range Status   Specimen Description URINE, RANDOM  Final   Special Requests   Final    NONE Performed at [facility], [place] [address], [place], [place] [address]    Culture >=100,000 COLONIES/mL KLEBSIELLA PNEUMONIAE (A)  Final   Report Status 04/08/2018 FINAL  Final   Organism ID, Bacteria KLEBSIELLA PNEUMONIAE (A)  Final      Susceptibility   Klebsiella pneumoniae - MIC*    AMPICILLIN >=32 RESISTANT Resistant     CEFAZOLIN <=4 SENSITIVE Sensitive     CEFTRIAXONE <=1 SENSITIVE Sensitive     CIPROFLOXACIN <=0.25 SENSITIVE Sensitive     GENTAMICIN <=1 SENSITIVE Sensitive     IMIPENEM <=0.25 SENSITIVE Sensitive     NITROFURANTOIN 128 RESISTANT Resistant     TRIMETH/SULFA <=20 SENSITIVE Sensitive     AMPICILLIN/SULBACTAM 8 SENSITIVE Sensitive     PIP/TAZO <=4 SENSITIVE Sensitive     Extended ESBL NEGATIVE Sensitive     * >=100,000 COLONIES/mL KLEBSIELLA PNEUMONIAE  MRSA PCR Screening     Status: None   Collection Time: 04/06/18  2:16 AM  Result Value Ref Range Status   MRSA by PCR NEGATIVE NEGATIVE Final    Comment:        The GeneXpert MRSA Assay (FDA approved for NASAL specimens only), is one component of a comprehensive MRSA  colonization surveillance program. It is not intended to diagnose MRSA infection nor to guide or monitor treatment for MRSA infections. Performed at [facility], [place] [address], [place], [place] [address]       Radiology Studies: No results found.   Medications:  Scheduled: . amiodarone  200 mg Oral Daily  . calcium acetate  667 mg Oral TID WC  . feeding supplement (NEPRO CARB STEADY)  237 mL Oral BID BM  . ferrous sulfate  325 mg Oral Q breakfast  . insulin aspart  0-9 Units Subcutaneous TID WC  . loratadine  10 mg Oral Daily  . mouth rinse  15 mL Mouth Rinse BID  . midodrine  10 mg Oral TID WC  . multivitamin with minerals  1 tablet Oral Daily  . patiromer  16.8 g Oral Daily  . sodium bicarbonate  1,300 mg Oral BID   Continuous: . sodium chloride 10 mL/hr at 04/09/18 1709  . cefTRIAXone (ROCEPHIN)  IV 1 g (04/09/18 1443)  . furosemide 120 mg (04/10/18 0540)  . heparin 1,400 Units/hr (04/08/18 2219)   GLO:VFIEPP chloride, acetaminophen **OR** acetaminophen, ondansetron (ZOFRAN) IV, sodium chloride  Assessment/Plan:    Acute on chronic kidney disease stage III-hyperkalemia It appears that patient has been on dialysis previously but only for a brief while.  This was approximately 1 year ago.  Patient's creatinine was 1.74 in December 2018.  When he presented it was markedly elevated at greater than 6.  No hydronephrosis noted on CT scan. Nephrology was consulted.  Patient was placed on high dose of Lasix.  Echocardiogram shows worsening in his ejection fraction from 30 to 35% last year to 15-20% this year.  So his renal failure is most likely secondary to  cardiorenal.  Patient continues to have poor urine output.  Blood pressures were soft and he was started on midodrine. Potassium level has improved.  He is on [place].  Renal function continues to get worse.  Continues to have poor urine output.  Acute on chronic systolic CHF Echocardiogram shows worsening in his  systolic function.  His EF is 15 to 20%.  Appreciate cardiology input.  Patient previously seen by heart failure team here as well as at [name].  Patient not considered a candidate for advanced therapies.  He is apparently failed milrinone previously.  Nephrology was suggesting inotrope but it does not appear to be an option now. Cardiology has recommended palliative medicine consult for goals of care.  This has been requested.  Continue IV Lasix.  Prognosis appears to be poor.    Bilateral lower extremity edema and cellulitis Patient has skin changes suggestive of chronic venous stasis.  Blood cultures remain negative so far.  MRSA PCR was negative.  Zosyn changed to ceftriaxone.  UTI with Klebsiella Unclear if he had symptoms.  However his urine cultures positive for Klebsiella.  Zosyn was changed over to ceftriaxone.   Hypotension Patient was started on midodrine.  Blood pressures have improved.   History of atrial fibrillation on anticoagulation with warfarin Supratherapeutic INR noted.  He was given vitamin K with improvement in INR.  Monitor on telemetry.  Continue amiodarone.  He is currently on IV heparin.  History of coronary artery disease Stable.  History of diabetes mellitus type 2 CBGs are stable.  Abnormal TSH TSH is noted to be 7.2.  Free T4 normal.  No known history of thyroid disease.  Will need to be repeated as outpatient.  Abnormal CT scan Multiple incidental findings noted including cholelithiasis.  He appears to be asymptomatic.  CT scan also raised concern for colitis however patient does not have any GI symptoms currently.  DVT Prophylaxis: IV heparin Code Status: Full code Family Communication: Discussed with the patient.  No family at bedside today.  Discussed with his wife and daughter yesterday. Disposition Plan: Management as outlined above.  Await palliative medicine input.    LOS: 5 days   [facility] Pager [phone] 04/10/2018,  9:46 AM  If 7PM-7AM, please contact night-coverage at [email or web address], password [facility]

## 2018-04-10 NOTE — Progress Notes (Signed)
[facility] NEPHROLOGY PROGRESS NOTE  Assessment/ Plan: Pt is a 72 y.o. yo male A. fib on Coumadin, type 2 diabetes with foot ulcer, coronary artery disease status post stent, CHF with EF of 30 to 35%, CKD 3 with baseline serum creatinine around 2 admitted with generalized weakness and worsening lower extremity cellulitis.  Consulted for hyperkalemia and worsening renal failure.   #Acute kidney injury on CKD stage III: Multifactorial etiology including cardiorenal syndrome and sepsis. The urinalysis with protein, RBC, WBC and bacteria. Urine PCR 0.9.  Ultrasound kidney with bilateral renal cortical thinning. -No urine output with higher dose of Lasix and serum creatinine level worsen to 7.6.  Patient is markedly fluid overloaded.  Cardiology note and palliative care note reviewed.  Patient with EF of 15%, end stage CHF who failed milrinone during previous hospitalization. No plan for inotropes per cardiology. In this case the prognosis is very poor.  Patient will likely not tolerate dialysis.  He is now DNR.  If no improvement likely comfort care from tomorrow per palliative care.  -Patient had AKI in 12/2016 requiring CRRT when complements, anti-GBM, ANCA were negative.  #Hyperkalemia: Due to renal failure and was on potassium chloride at home.  Serum potassium level 5.0 today.  Continue Veltassa.  On Lasix however with no urine output.  #Metabolic acidosis: on sodium bicarbonate.  #Systolic CHF with EF of 29% grade 2 diastolic dysfunction.  Evaluated by cardiology, poor prognosis.  # Hypotension: on midodrine.  Monitor blood pressure.  Subjective: Seen and examined at bedside.  Has some shortness of breath.  Denied nausea vomiting chest pain. Objective Vital signs in last 24 hours: Vitals:   04/10/18 0148 04/10/18 0500 04/10/18 0800 04/10/18 1100  BP: 110/60     Pulse: (!) 52  60 (!) 59  Resp: 15   18  Temp:   97.7 F (36.5 C)   TempSrc:   Oral   SpO2: 93%  91% 93%   Weight:  115.8 kg (255 lb 4.7 oz)    Height:       Weight change:   Intake/Output Summary (Last 24 hours) at 04/10/2018 1308 Last data filed at 04/10/2018 0500 Gross per 24 hour  Intake 3695.22 ml  Output 100 ml  Net 3595.22 ml       Labs: Basic Metabolic Panel: Recent Labs  Lab 04/08/18 0616 04/09/18 0800 04/10/18 0432  NA 133* 134* 132*  K 5.1 5.1 5.0  CL 100 98 96*  CO2 19* 21* 21*  GLUCOSE 88 108* 111*  BUN 119* 117* 118*  CREATININE 6.49* 6.99* 7.60*  CALCIUM 7.9* 8.2* 8.3*  PHOS 7.5* 7.6* 7.9*   Liver Function Tests: Recent Labs  Lab 03/30/2018 1934 04/06/18 0121  04/08/18 0616 04/09/18 0800 04/10/18 0432  AST 21 19  --   --   --   --   ALT 25 26  --   --   --   --   ALKPHOS 109 99  --   --   --   --   BILITOT 0.6 0.7  --   --   --   --   PROT 6.8 6.3*  --   --   --   --   ALBUMIN 2.8* 2.5*   < > 2.3* 3.3* 3.2*   < > = values in this interval not displayed.   Recent Labs  Lab 03/17/2018 1934  LIPASE 47   No results for input(s): AMMONIA in the last 168 hours. CBC:  Recent Labs  Lab 04/04/2018 1934 04/06/18 0121 04/07/18 0645 04/08/18 0616 04/09/18 0800 04/10/18 0432  WBC 11.5* 12.3* 12.0* 11.7* 11.1* 13.1*  NEUTROABS 9.5* 9.6* 9.8*  --   --   --   HGB 13.9 13.4 13.6 12.9* 12.0* 12.5*  HCT 46.2 42.8 43.3 42.8 39.3 40.7  MCV 98.9 97.5 96.9 98.8 98.3 97.8  PLT 247 231 245 198 187 212   Cardiac Enzymes: Recent Labs  Lab 04/06/18 0121  CKTOTAL 194  TROPONINI 0.04*   CBG: Recent Labs  Lab 04/09/18 0751 04/09/18 1209 04/09/18 1620 04/09/18 2113 04/10/18 0751  GLUCAP 103* 117* 140* 209* 106*    Iron Studies: No results for input(s): IRON, TIBC, TRANSFERRIN, FERRITIN in the last 72 hours. Studies/Results: No results found.  Medications: Infusions: . sodium chloride 10 mL/hr at 04/09/18 1709  . cefTRIAXone (ROCEPHIN)  IV 1 g (04/09/18 1443)  . furosemide 120 mg (04/10/18 0540)  . heparin 1,400 Units/hr (04/10/18 0948)     Scheduled Medications: . amiodarone  200 mg Oral Daily  . calcium acetate  667 mg Oral TID WC  . feeding supplement (NEPRO CARB STEADY)  237 mL Oral BID BM  . ferrous sulfate  325 mg Oral Q breakfast  . insulin aspart  0-9 Units Subcutaneous TID WC  . loratadine  10 mg Oral Daily  . mouth rinse  15 mL Mouth Rinse BID  . midodrine  10 mg Oral TID WC  . multivitamin with minerals  1 tablet Oral Daily  . patiromer  16.8 g Oral Daily  . sodium bicarbonate  1,300 mg Oral BID    have reviewed scheduled and prn medications.  Physical Exam: General: Not in distress, speaking full sentence Heart: Regular rate rhythm S1-S2 normal.  No rub Lungs: Bilateral coarse breath sound Abdomen:soft, Non-tender, non-distended Extremities: Lower extremity cellulitis and edema.  No improvement Neurology: Alert, awake and following commands. Dron Prasad Bhandari 04/10/2018,1:08 PM  LOS: 5 days

## 2018-04-10 NOTE — Progress Notes (Signed)
Progress Note  Patient Name: Christian Garner Date of Encounter: 04/10/2018  Primary Cardiologist: Dr Haroldine Laws  Subjective   Appears dyspneic; no chest pain  Inpatient Medications    Scheduled Meds: . amiodarone  200 mg Oral Daily  . calcium acetate  667 mg Oral TID WC  . feeding supplement (NEPRO CARB STEADY)  237 mL Oral BID BM  . ferrous sulfate  325 mg Oral Q breakfast  . insulin aspart  0-9 Units Subcutaneous TID WC  . loratadine  10 mg Oral Daily  . mouth rinse  15 mL Mouth Rinse BID  . midodrine  10 mg Oral TID WC  . multivitamin with minerals  1 tablet Oral Daily  . patiromer  16.8 g Oral Daily  . sodium bicarbonate  1,300 mg Oral BID   Continuous Infusions: . sodium chloride 10 mL/hr at 04/09/18 1709  . cefTRIAXone (ROCEPHIN)  IV 1 g (04/09/18 1443)  . furosemide 120 mg (04/10/18 0540)  . heparin 1,400 Units/hr (04/10/18 0948)   PRN Meds: sodium chloride, acetaminophen **OR** acetaminophen, ondansetron (ZOFRAN) IV, sodium chloride   Vital Signs    Vitals:   04/09/18 2018 04/10/18 0148 04/10/18 0500 04/10/18 0800  BP: (!) 116/59 110/60    Pulse: (!) 54 (!) 52  60  Resp: 17 15    Temp:    97.7 F (36.5 C)  TempSrc:    Oral  SpO2: 91% 93%  91%  Weight:   255 lb 4.7 oz (115.8 kg)   Height:        Intake/Output Summary (Last 24 hours) at 04/10/2018 1131 Last data filed at 04/10/2018 0500 Gross per 24 hour  Intake 3695.22 ml  Output 100 ml  Net 3595.22 ml   Filed Weights   04/06/18 0500 04/07/18 0518 04/10/18 0500  Weight: 246 lb 14.6 oz (112 kg) 254 lb 3.1 oz (115.3 kg) 255 lb 4.7 oz (115.8 kg)    Telemetry    Sinus - Personally Reviewed  Physical Exam   GEN: Mildly dyspneic Neck: supple, +JVD Cardiac: RRR Respiratory: Diminished BS bases; no wheeze GI: Soft, distended, not tender MS: 3+ edema;l chronic skin changes  Neuro:  Grossly intact   Labs    Chemistry Recent Labs  Lab 04/02/2018 1934 04/06/18 0121  04/08/18 0616  04/09/18 0800 04/10/18 0432  NA 129* 135   < > 133* 134* 132*  K 6.3* 6.0*   < > 5.1 5.1 5.0  CL 96* 103   < > 100 98 96*  CO2 19* 20*   < > 19* 21* 21*  GLUCOSE 149* 94   < > 88 108* 111*  BUN 127* 119*   < > 119* 117* 118*  CREATININE 6.69* 6.24*   < > 6.49* 6.99* 7.60*  CALCIUM 8.0* 7.5*   < > 7.9* 8.2* 8.3*  PROT 6.8 6.3*  --   --   --   --   ALBUMIN 2.8* 2.5*   < > 2.3* 3.3* 3.2*  AST 21 19  --   --   --   --   ALT 25 26  --   --   --   --   ALKPHOS 109 99  --   --   --   --   BILITOT 0.6 0.7  --   --   --   --   GFRNONAA 7* 8*   < > 8* 7* 6*  GFRAA 9* 9*   < > 9* 8*  7*  ANIONGAP 14 12   < > 14 15 15    < > = values in this interval not displayed.     Hematology Recent Labs  Lab 04/08/18 0616 04/09/18 0800 04/10/18 0432  WBC 11.7* 11.1* 13.1*  RBC 4.33 4.00* 4.16*  HGB 12.9* 12.0* 12.5*  HCT 42.8 39.3 40.7  MCV 98.8 98.3 97.8  MCH 29.8 30.0 30.0  MCHC 30.1 30.5 30.7  RDW 17.0* 17.0* 16.9*  PLT 198 187 212    Cardiac Enzymes Recent Labs  Lab 04/06/18 0121  TROPONINI 0.04*     BNP Recent Labs  Lab 04/06/18 1415  BNP 1,115.5*     Patient Profile     72 year old male with past medical history of end-stage chronic systolic congestive heart failure, coronary artery disease, chronic stage III kidney disease, paroxysmal atrial fibrillation, diabetes mellitus for evaluation of acute on chronic systolic congestive heart failure.  Last echocardiogram June 2018 showed ejection fraction 30 to 35%, severe left ventricular hypertrophy, mild to moderate mitral regurgitation and biatrial enlargement.  There was note of right ventricular enlargement and findings suggestive of cor pulmonale.  Patient previously seen by Dr. Haroldine Laws and felt to have end-stage systolic congestive heart failure.  He apparently failed milrinone during a previous hospitalization and is not felt to be a candidate for advanced therapies because of age and comorbidities.  It was felt palliative  care/hospice was indicated.  He was also seen by Dr. Stann Mainland at [facility] and felt not to be a candidate for advanced therapies.  He has been treated with diuretics for symptomatic relief.  He was admitted on June 25 with generalized weakness, mild dyspnea and worsening lower extremity edema.  He also had failure to thrive.  He was found to have acute on chronic renal failure with creatinine 6.69 and potassium 6.3.  Urinalysis showed Klebsiella UTI and he is also felt to have lower ext cellulitis.  He has been treated with diuretics and nephrology is following and considering dialysis. Also treated with antibiotics. Patient denies chest pain.  Cardiology now asked to evaluate.  Assessment & Plan    1 acute on chronic systolic congestive heart failure-patient appears dyspneic this morning and remains markedly volume overloaded. I/O +3928 despite lasix. He has severe LV dysfunction.  As outlined in previous notes patient has been evaluated by Dr. Haroldine Laws and Dr. Stann Mainland at [facility] and he is not a candidate for advanced therapies because of age and comorbidities.  He failed milrinone in the past.  There are no other good options at this point.  Prognosis is poor.  Palliative care now evaluating.  2 acute on chronic stage III kidney disease-renal function is worse today.  Patient is being followed by nephrology.    Unclear if he is a dialysis candidate.  3 lower extremity cellulitis/Klebsiella urinary tract infection-continue antibiotics.  4 history of atrial fibrillation-patient in sinus rhythm. Continue amiodarone.  Continue IV heparin.   For questions or updates, please contact [facility] Please consult [email or web address] for contact info under Cardiology/STEMI.      Signed, Kirk Ruths, MD  04/10/2018, 11:31 AM

## 2018-04-10 NOTE — Progress Notes (Signed)
ANTICOAGULATION CONSULT NOTE - Follow Up Consult  Pharmacy Consult for Heparin Indication: atrial fibrillation  Allergies  Allergen Reactions  . Metolazone     Per pt family, pt gets delirious and confused when taking metolazone    Patient Measurements: Height: 6\' 2"  (188 cm) Weight: 255 lb 4.7 oz (115.8 kg) IBW/kg (Calculated) : 82.2 Heparin Dosing Weight:    Vital Signs: Temp: 98.6 F (37 C) (06/29 1950) Temp Source: Oral (06/29 1950) BP: 110/60 (06/30 0148) Pulse Rate: 52 (06/30 0148)  Labs: Recent Labs    04/08/18 0616 04/09/18 0800 04/10/18 0432  HGB 12.9* 12.0* 12.5*  HCT 42.8 39.3 40.7  PLT 198 187 212  HEPARINUNFRC 0.47 0.40 0.44  CREATININE 6.49* 6.99* 7.60*    Estimated Creatinine Clearance: 11.9 mL/min (A) (by C-G formula based on SCr of 7.6 mg/dL (H)).   Assessment:  Anticoag: Afib on warfarin PTA, INR >10 s/p Vit K 5mg  IV 6/26. Heparin bridge - INR 1.62 (6/27) HL 0.44, Hgb 12.5 (baseline 13.9). Plts 212 (baseline  231)  Goal of Therapy:  Heparin level 0.3-0.7 units/ml Monitor platelets by anticoagulation protocol: Yes   Plan:  IV heparin 1400 units/hr Daily HL and CBC F/u need for HD See MD sticky note about Synthroid  Christian Garner, PharmD, BCPS Clinical Staff Pharmacist Pager [phone]  Christian Garner 04/10/2018,7:23 AM

## 2018-04-11 DIAGNOSIS — Z515 Encounter for palliative care: Secondary | ICD-10-CM

## 2018-04-11 DIAGNOSIS — 419620001 Death: Secondary | SNOMED CT | POA: Diagnosis not present

## 2018-04-11 DIAGNOSIS — F419 Anxiety disorder, unspecified: Secondary | ICD-10-CM

## 2018-04-11 LAB — CBC
HCT: 42 % (ref 39.0–52.0)
HEMOGLOBIN: 12.8 g/dL — AB (ref 13.0–17.0)
MCH: 30 pg (ref 26.0–34.0)
MCHC: 30.5 g/dL (ref 30.0–36.0)
MCV: 98.6 fL (ref 78.0–100.0)
Platelets: 211 10*3/uL (ref 150–400)
RBC: 4.26 MIL/uL (ref 4.22–5.81)
RDW: 16.8 % — ABNORMAL HIGH (ref 11.5–15.5)
WBC: 14.3 10*3/uL — ABNORMAL HIGH (ref 4.0–10.5)

## 2018-04-11 LAB — HEPARIN LEVEL (UNFRACTIONATED): HEPARIN UNFRACTIONATED: 0.57 [IU]/mL (ref 0.30–0.70)

## 2018-04-11 LAB — BASIC METABOLIC PANEL
Anion gap: 15 (ref 5–15)
BUN: 122 mg/dL — ABNORMAL HIGH (ref 8–23)
CHLORIDE: 93 mmol/L — AB (ref 98–111)
CO2: 24 mmol/L (ref 22–32)
Calcium: 8.8 mg/dL — ABNORMAL LOW (ref 8.9–10.3)
Creatinine, Ser: 8.5 mg/dL — ABNORMAL HIGH (ref 0.61–1.24)
GFR calc non Af Amer: 6 mL/min — ABNORMAL LOW (ref >60)
GFR, EST AFRICAN AMERICAN: 6 mL/min — AB (ref >60)
Glucose, Bld: 118 mg/dL — ABNORMAL HIGH (ref 70–99)
POTASSIUM: 5.1 mmol/L (ref 3.5–5.1)
SODIUM: 132 mmol/L — AB (ref 135–145)

## 2018-04-11 LAB — GLUCOSE, CAPILLARY
GLUCOSE-CAPILLARY: 111 mg/dL — AB (ref 70–99)
Glucose-Capillary: 145 mg/dL — ABNORMAL HIGH (ref 70–99)
Glucose-Capillary: 131 mg/dL — ABNORMAL HIGH (ref 70–99)

## 2018-04-11 MED ORDER — HYDROMORPHONE HCL 1 MG/ML IJ SOLN
0.2500 mg | INTRAMUSCULAR | Status: DC | PRN
  Administered 2018-04-11 – 2018-04-12 (×3): 0.25 mg via INTRAVENOUS
  Filled 2018-04-11 (×3): qty 0.5

## 2018-04-11 MED ORDER — OXYCODONE HCL 5 MG PO TABS
5.0000 mg | ORAL_TABLET | ORAL | Status: DC | PRN
  Administered 2018-04-11: 5 mg via ORAL
  Filled 2018-04-11: qty 1

## 2018-04-11 MED ORDER — GLYCOPYRROLATE 0.2 MG/ML IJ SOLN: 0.2000 mg | INTRAMUSCULAR | Status: DC | PRN

## 2018-04-11 MED ORDER — LORAZEPAM 2 MG/ML IJ SOLN
0.5000 mg | INTRAMUSCULAR | Status: DC | PRN
  Administered 2018-04-11 – 2018-04-12 (×4): 0.5 mg via INTRAVENOUS
  Filled 2018-04-11 (×4): qty 1

## 2018-04-11 MED ORDER — LORAZEPAM 2 MG/ML IJ SOLN: 0.5000 mg | Freq: Four times a day (QID) | INTRAMUSCULAR | Status: DC | PRN

## 2018-04-11 MED ORDER — BIOTENE DRY MOUTH MT LIQD
15.0000 mL | Freq: Two times a day (BID) | OROMUCOSAL | Status: DC
  Administered 2018-04-11: 15 mL via TOPICAL

## 2018-04-11 MED ORDER — LORAZEPAM 2 MG/ML IJ SOLN
0.5000 mg | Freq: Once | INTRAMUSCULAR | Status: AC
  Administered 2018-04-11: 0.5 mg via INTRAVENOUS
  Filled 2018-04-11: qty 1

## 2018-04-11 NOTE — Progress Notes (Signed)
ANTICOAGULATION CONSULT NOTE - Follow Up Consult  Pharmacy Consult for Heparin Indication: atrial fibrillation  Allergies  Allergen Reactions  . Metolazone     Per pt family, pt gets delirious and confused when taking metolazone    Patient Measurements: Height: 6\' 2"  (188 cm) Weight: 259 lb 14.8 oz (117.9 kg) IBW/kg (Calculated) : 82.2 Heparin Dosing Weight:    Vital Signs: Temp: 97.8 F (36.6 C) (07/01 0744) Temp Source: Oral (07/01 0744) BP: 103/52 (07/01 0541) Pulse Rate: 63 (07/01 0541)  Labs: Recent Labs    04/09/18 0800 04/10/18 0432 04/11/18 0436  HGB 12.0* 12.5* 12.8*  HCT 39.3 40.7 42.0  PLT 187 212 211  HEPARINUNFRC 0.40 0.44 0.57  CREATININE 6.99* 7.60*  --     Estimated Creatinine Clearance: 12 mL/min (A) (by C-G formula based on SCr of 7.6 mg/dL (H)).   Assessment: 72 year old male continues on heparin while warfarin is on hold Heparin level therapeutic this AM  Goal of Therapy:  Heparin level 0.3-0.7 units/ml Monitor platelets by anticoagulation protocol: Yes   Plan:  IV heparin 1400 units/hr Daily HL and CBC  Thank you Anette Guarneri, PharmD [phone] 04/11/2018,8:19 AM

## 2018-04-11 NOTE — Progress Notes (Signed)
Occupational Therapy Treatment Patient Details Name: Christian Garner MRN: [id number] DOB: [birth year] Today's Date: 04/11/2018    History of present illness Pt is a 72 y.o. male admitted 04/09/2018 with worsening BLE swelling and generalized weakness; worked up for CHF, BLE cellulitis, AKI and UTI. PMH includes CHF, CAD, a-fib on Coumadin, CKD III, DMII.   OT comments  Pt restless, crying out when OT entered the room. Expressing suicidal thoughts and desires. "I know I'm dying, just put me out of my misery" OT able to re-direct and calm with simple ADL (grooming tasks) at bed level with Pt's favorite music (classic rock). RN entered and provided ativan for Pt which put Pt in peaceful states. OT noted that [facility] meeting today at 2/3 and therapy will follow to see what desired outcomes are for appropriateness of therapy.    Follow Up Recommendations  SNF;Supervision/Assistance - 24 hour(good experience at [facility])    Equipment Recommendations  Other (comment)(defer to next venue)    Recommendations for Other Services Other (comment)(Palliative)    Precautions / Restrictions Precautions Precautions: Fall Precaution Comments: BLE wrapped, watch O2       Mobility Bed Mobility                  Transfers                      Balance                                           ADL either performed or assessed with clinical judgement   ADL Overall ADL's : Needs assistance/impaired     Grooming: Wash/dry face;Oral care;Set up;Bed level Grooming Details (indicate cue type and reason): completed bed level                                     Vision       Perception     Praxis      Cognition Arousal/Alertness: Awake/alert Behavior During Therapy: Anxious;Restless Overall Cognitive Status: Impaired/Different from baseline Area of Impairment: Attention;Awareness;Problem solving                   Current Attention Level:  Sustained       Awareness: Emergent Problem Solving: Decreased initiation;Requires verbal cues;Requires tactile cues General Comments: Pt calling out when OT entered room, vocalizing that "please just end me, I want to commit suicide" able to re-direct with small, self-care tasks and classic rock music.         Exercises     Shoulder Instructions       General Comments RN in at end of session and administering ativan    Pertinent Vitals/ Pain       Pain Assessment: Faces Faces Pain Scale: Hurts even more Pain Location: Buttocks at flexiseal insertion Pain Descriptors / Indicators: Discomfort;Grimacing Pain Intervention(s): Monitored during session;RN gave pain meds during session  Home Living                                          Prior Functioning/Environment              Frequency  Min 2X/week  Progress Toward Goals  OT Goals(current goals can now be found in the care plan section)  Progress towards OT goals: Not progressing toward goals - comment(decreased cognition)  Acute Rehab OT Goals Patient Stated Goal: none stated  Plan Discharge plan remains appropriate;Frequency remains appropriate    Co-evaluation                 AM-PAC PT "6 Clicks" Daily Activity     Outcome Measure   Help from another person eating meals?: A Little Help from another person taking care of personal grooming?: A Little Help from another person toileting, which includes using toliet, bedpan, or urinal?: Total Help from another person bathing (including washing, rinsing, drying)?: A Lot Help from another person to put on and taking off regular upper body clothing?: A Lot Help from another person to put on and taking off regular lower body clothing?: A Lot 6 Click Score: 13    End of Session Equipment Utilized During Treatment: Oxygen(4L)  OT Visit Diagnosis: Unsteadiness on feet (R26.81);Other abnormalities of gait and mobility  (R26.89);Muscle weakness (generalized) (M62.81);Pain Pain - Right/Left: (bilateral) Pain - part of body: Leg   Activity Tolerance Treatment limited secondary to agitation   Patient Left in bed;with call bell/phone within reach;with bed alarm set   Nurse Communication Mobility status        Time: 3748-2707 OT Time Calculation (min): 22 min  Charges: OT General Charges $OT Visit: 1 Visit OT Treatments $Self Care/Home Management : 8-22 mins  Hulda Humphrey OTR/L [place] 04/11/2018, 1:18 PM

## 2018-04-11 NOTE — Progress Notes (Signed)
S: Results of palliative care meeting noted O:BP 106/66 (BP Location: Left Arm)   Pulse (!) 59   Temp 97.8 F (36.6 C) (Oral)   Resp (!) 21   Ht 6\' 2"  (1.88 m)   Wt 117.9 kg (259 lb 14.8 oz)   SpO2 93%   BMI 33.37 kg/m   Intake/Output Summary (Last 24 hours) at 04/11/2018 1631 Last data filed at 04/11/2018 1300 Gross per 24 hour  Intake 250 ml  Output 10 ml  Net 240 ml   Intake/Output: I/O last 3 completed shifts: In: 4446.6 [I.V.:237.5; Other:5; IV Piggyback:4204.1] Out: 75 [Urine:55; Emesis/NG output:20]  Intake/Output this shift:  Total I/O In: 245 [P.O.:240; Other:5] Out: 10 [Urine:10] Weight change: 2.1 kg (4 lb 10.1 oz) Gen: chronically ill appearing WM, delirious CVS: no rub Resp: bibasilar crackles Abd: benign Ext: 2+ edema  Recent Labs  Lab 04/08/2018 1934 04/06/18 0121 04/06/18 1105 04/07/18 0645 04/07/18 1508 04/08/18 0616 04/09/18 0800 04/10/18 0432 04/11/18 0436  NA 129* 135 131* 132* 132* 133* 134* 132* 132*  K 6.3* 6.0* 6.2* 6.8* 6.2* 5.1 5.1 5.0 5.1  CL 96* 103 100 97* 97* 100 98 96* 93*  CO2 19* 20* 22 24 22  19* 21* 21* 24  GLUCOSE 149* 94 160* 118* 127* 88 108* 111* 118*  BUN 127* 119* 114* 116* 119* 119* 117* 118* 122*  CREATININE 6.69* 6.24* 5.95* 6.51* 6.44* 6.49* 6.99* 7.60* 8.50*  ALBUMIN 2.8* 2.5*  --  2.5* 2.5* 2.3* 3.3* 3.2*  --   CALCIUM 8.0* 7.5* 7.4* 8.1* 8.0* 7.9* 8.2* 8.3* 8.8*  PHOS  --  6.4*  --  6.5* 6.7* 7.5* 7.6* 7.9*  --   AST 21 19  --   --   --   --   --   --   --   ALT 25 26  --   --   --   --   --   --   --    Liver Function Tests: Recent Labs  Lab 03/31/2018 1934 04/06/18 0121  04/08/18 0616 04/09/18 0800 04/10/18 0432  AST 21 19  --   --   --   --   ALT 25 26  --   --   --   --   ALKPHOS 109 99  --   --   --   --   BILITOT 0.6 0.7  --   --   --   --   PROT 6.8 6.3*  --   --   --   --   ALBUMIN 2.8* 2.5*   < > 2.3* 3.3* 3.2*   < > = values in this interval not displayed.   Recent Labs  Lab 03/21/2018 1934   LIPASE 47   No results for input(s): AMMONIA in the last 168 hours. CBC: Recent Labs  Lab 03/16/2018 1934 04/06/18 0121 04/07/18 0645 04/08/18 0616 04/09/18 0800 04/10/18 0432 04/11/18 0436  WBC 11.5* 12.3* 12.0* 11.7* 11.1* 13.1* 14.3*  NEUTROABS 9.5* 9.6* 9.8*  --   --   --   --   HGB 13.9 13.4 13.6 12.9* 12.0* 12.5* 12.8*  HCT 46.2 42.8 43.3 42.8 39.3 40.7 42.0  MCV 98.9 97.5 96.9 98.8 98.3 97.8 98.6  PLT 247 231 245 198 187 212 211   Cardiac Enzymes: Recent Labs  Lab 04/06/18 0121  CKTOTAL 194  TROPONINI 0.04*   CBG: Recent Labs  Lab 04/10/18 1307 04/10/18 1732 04/10/18 2235 04/11/18 0751 04/11/18 1208  GLUCAP 144* 155* 133* 111* 145*    Iron Studies: No results for input(s): IRON, TIBC, TRANSFERRIN, FERRITIN in the last 72 hours. Studies/Results: No results found. Marland Kitchen antiseptic oral rinse  15 mL Topical BID  . feeding supplement (NEPRO CARB STEADY)  237 mL Oral BID BM  . loratadine  10 mg Oral Daily  . mouth rinse  15 mL Mouth Rinse BID    BMET    Component Value Date/Time   NA 132 (L) 04/11/2018 0436   K 5.1 04/11/2018 0436   CL 93 (L) 04/11/2018 0436   CO2 24 04/11/2018 0436   GLUCOSE 118 (H) 04/11/2018 0436   BUN 122 (H) 04/11/2018 0436   CREATININE 8.50 (H) 04/11/2018 0436   CALCIUM 8.8 (L) 04/11/2018 0436   GFRNONAA 6 (L) 04/11/2018 0436   GFRAA 6 (L) 04/11/2018 0436   CBC    Component Value Date/Time   WBC 14.3 (H) 04/11/2018 0436   RBC 4.26 04/11/2018 0436   HGB 12.8 (L) 04/11/2018 0436   HCT 42.0 04/11/2018 0436   PLT 211 04/11/2018 0436   MCV 98.6 04/11/2018 0436   MCH 30.0 04/11/2018 0436   MCHC 30.5 04/11/2018 0436   RDW 16.8 (H) 04/11/2018 0436   LYMPHSABS 1.1 04/07/2018 0645   MONOABS 1.0 04/07/2018 0645   EOSABS 0.1 04/07/2018 0645   BASOSABS 0.0 04/07/2018 0645     Assessment/Plan:  1. AKI/CKD stage 3- multifactorial with cardiorenal syndrome, sepsis/ischemic ATN.  Not responding to high dose lasix and palliative  care consulted and have assisted with transition to comfort measures and hospice placement.  Will sign off.  Please call with any questions or concerns 2. Disposition- transitioning to full comfort measures.  Awaiting residential hospice placement.  Donetta Potts, MD [employer] [phone]

## 2018-04-11 NOTE — Progress Notes (Signed)
Pt very anxious c/o SOB, dilauded given. Pt continues to verbalize anxiety. Baltazar Najjar, Np paged. New order for Ativan placed. Ativan given, pt verbalized improvement. VS stable, SpO2 99% on 4L O2 via [place]. Will continue to monitor pt.

## 2018-04-11 NOTE — Progress Notes (Signed)
[facility] PROGRESS NOTE  KOBEY SIDES NFA:[id number] DOB: Dec 20, 1945 DOA: 03/28/2018  PCP: [name], MD  Brief History/Interval Summary: 72 year old Caucasian male with a past medical history of chronic systolic CHF EF known to be 30 to 35% based on echo done in June 2018, coronary artery disease status post PTCA, atrial fibrillation on Coumadin, chronic kidney disease stage III presented with worsening bilateral lower extremity swelling and cellulitis as well as generalized weakness.  Patient was noted to be hypotensive and given fluids in the ED.  Creatinine was noted to be markedly elevated at 6.69 with the hyperkalemia.  Nephrology was consulted.  Patient was hospitalized.  Patient was seen by cardiology as well.  Repeat echocardiogram showed worsening CHF.  Prognosis thought to be poor.  Palliative medicine was consulted.  Reason for Visit: Acute kidney injury with hyperkalemia.  Bilateral lower extremity cellulitis.  Consultants: Nephrology.  Critical care medicine.  Cardiology.  Palliative medicine  Procedures:  Transthoracic echocardiogram Study Conclusions  - Left ventricle: The cavity size was severely dilated. Systolic   function was severely reduced. The estimated ejection fraction   was in the range of 15% to 20%. Severe diffuse hypokinesis with   no identifiable regional variations. Features are consistent with   a pseudonormal left ventricular filling pattern, with concomitant   abnormal relaxation and increased filling pressure (grade 2   diastolic dysfunction). Acoustic contrast opacification revealed   no evidence ofthrombus. - Ventricular septum: Septal motion showed paradox. The contour   showed diastolic flattening. - Mitral valve: There was moderate regurgitation directed   centrally. Diastolic regurgitation was present. - Left atrium: The atrium was severely dilated. - Right ventricle: The cavity size was severely dilated. Systolic   function  was severely reduced. - Right atrium: The atrium was moderately to severely dilated. - Tricuspid valve: There was moderate regurgitation. - Pulmonary arteries: Systolic pressure was moderately increased.   PA peak pressure: 49 mm Hg (S).  Antibiotics: Zosyn and linezolid given in the ED Zosyn was discontinued on 6/28 Ceftriaxone 6/28   Subjective/Interval History: Patient admits to shortness of breath.  Denies any chest pain.  Somewhat lethargic this morning.      ROS: Denies any nausea or vomiting  Objective:  Vital Signs  Vitals:   04/11/18 0539 04/11/18 0541 04/11/18 0744 04/11/18 0800  BP:  (!) 103/52  (!) 103/59  Pulse:  63  (!) 59  Resp:  14  17  Temp: 97.6 F (36.4 C)  97.8 F (36.6 C)   TempSrc: Axillary  Oral   SpO2:  (!) 66%  (!) 89%  Weight:      Height:        Intake/Output Summary (Last 24 hours) at 04/11/2018 0912 Last data filed at 04/11/2018 0800 Gross per 24 hour  Intake 1947.56 ml  Output 85 ml  Net 1862.56 ml   Filed Weights   04/07/18 0518 04/10/18 0500 04/11/18 0500  Weight: 115.3 kg (254 lb 3.1 oz) 115.8 kg (255 lb 4.7 oz) 117.9 kg (259 lb 14.8 oz)    General appearance: Somewhat distracted.  Lethargic. Resp: Noted to be tachypneic.  Some use of accessory muscle noted.  Crackles noted bilateral bases.  No wheezing or rhonchi.   Cardio: S1-S2 is normal regular.  No S3-S4.  No rubs murmurs or bruit GI: Abdomen remains soft.  Nontender nondistended Extremities: Lower extremity edema is slightly better.  Remains erythematous.  Covered in dressing.    Neurologic: Distracted.  No  obvious focal neurological deficits  Lab Results:  Data Reviewed: I have personally reviewed following labs and imaging studies  CBC: Recent Labs  Lab 04/08/2018 1934 04/06/18 0121 04/07/18 0645 04/08/18 0616 04/09/18 0800 04/10/18 0432 04/11/18 0436  WBC 11.5* 12.3* 12.0* 11.7* 11.1* 13.1* 14.3*  NEUTROABS 9.5* 9.6* 9.8*  --   --   --   --   HGB 13.9 13.4 13.6  12.9* 12.0* 12.5* 12.8*  HCT 46.2 42.8 43.3 42.8 39.3 40.7 42.0  MCV 98.9 97.5 96.9 98.8 98.3 97.8 98.6  PLT 247 231 245 198 187 212 778    Basic Metabolic Panel: Recent Labs  Lab 04/07/18 0645 04/07/18 1508 04/08/18 0616 04/09/18 0800 04/10/18 0432 04/11/18 0436  NA 132* 132* 133* 134* 132* 132*  K 6.8* 6.2* 5.1 5.1 5.0 5.1  CL 97* 97* 100 98 96* 93*  CO2 24 22 19* 21* 21* 24  GLUCOSE 118* 127* 88 108* 111* 118*  BUN 116* 119* 119* 117* 118* 122*  CREATININE 6.51* 6.44* 6.49* 6.99* 7.60* 8.50*  CALCIUM 8.1* 8.0* 7.9* 8.2* 8.3* 8.8*  MG 3.2*  --   --   --   --   --   PHOS 6.5* 6.7* 7.5* 7.6* 7.9*  --     GFR: Estimated Creatinine Clearance: 10.7 mL/min (A) (by C-G formula based on SCr of 8.5 mg/dL (H)).  Liver Function Tests: Recent Labs  Lab 03/28/2018 1934 04/06/18 0121 04/07/18 0645 04/07/18 1508 04/08/18 0616 04/09/18 0800 04/10/18 0432  AST 21 19  --   --   --   --   --   ALT 25 26  --   --   --   --   --   ALKPHOS 109 99  --   --   --   --   --   BILITOT 0.6 0.7  --   --   --   --   --   PROT 6.8 6.3*  --   --   --   --   --   ALBUMIN 2.8* 2.5* 2.5* 2.5* 2.3* 3.3* 3.2*    Recent Labs  Lab 03/20/2018 1934  LIPASE 47   Coagulation Profile: Recent Labs  Lab 04/06/18 0121 04/06/18 0734 04/06/18 2226 04/07/18 0645  INR >10.00* 3.37 1.69 1.62    Cardiac Enzymes: Recent Labs  Lab 04/06/18 0121  CKTOTAL 194  TROPONINI 0.04*    CBG: Recent Labs  Lab 04/10/18 0751 04/10/18 1307 04/10/18 1732 04/10/18 2235 04/11/18 0751  GLUCAP 106* 144* 155* 133* 111*     Recent Results (from the past 240 hour(s))  Blood Culture (routine x 2)     Status: None   Collection Time: 04/09/2018  7:34 PM  Result Value Ref Range Status   Specimen Description BLOOD LEFT ANTECUBITAL  Final   Special Requests   Final    BOTTLES DRAWN AEROBIC AND ANAEROBIC Blood Culture results may not be optimal due to an inadequate volume of blood received in culture bottles    Culture   Final    NO GROWTH 5 DAYS Performed at [facility], [place] [address], [place], [place] [address]    Report Status 04/10/2018 FINAL  Final  Blood Culture (routine x 2)     Status: None   Collection Time: 03/23/2018  7:49 PM  Result Value Ref Range Status   Specimen Description BLOOD RIGHT HAND  Final   Special Requests   Final    BOTTLES DRAWN  AEROBIC ONLY Blood Culture results may not be optimal due to an inadequate volume of blood received in culture bottles   Culture   Final    NO GROWTH 5 DAYS Performed at [facility], [place] [address], [place], [place] [address]    Report Status 04/10/2018 FINAL  Final  Urine culture     Status: Abnormal   Collection Time: 04/03/2018 11:48 PM  Result Value Ref Range Status   Specimen Description URINE, RANDOM  Final   Special Requests   Final    NONE Performed at [facility], [place] [address], [place], [place] [address]    Culture >=100,000 COLONIES/mL KLEBSIELLA PNEUMONIAE (A)  Final   Report Status 04/08/2018 FINAL  Final   Organism ID, Bacteria KLEBSIELLA PNEUMONIAE (A)  Final      Susceptibility   Klebsiella pneumoniae - MIC*    AMPICILLIN >=32 RESISTANT Resistant     CEFAZOLIN <=4 SENSITIVE Sensitive     CEFTRIAXONE <=1 SENSITIVE Sensitive     CIPROFLOXACIN <=0.25 SENSITIVE Sensitive     GENTAMICIN <=1 SENSITIVE Sensitive     IMIPENEM <=0.25 SENSITIVE Sensitive     NITROFURANTOIN 128 RESISTANT Resistant     TRIMETH/SULFA <=20 SENSITIVE Sensitive     AMPICILLIN/SULBACTAM 8 SENSITIVE Sensitive     PIP/TAZO <=4 SENSITIVE Sensitive     Extended ESBL NEGATIVE Sensitive     * >=100,000 COLONIES/mL KLEBSIELLA PNEUMONIAE  MRSA PCR Screening     Status: None   Collection Time: 04/06/18  2:16 AM  Result Value Ref Range Status   MRSA by PCR NEGATIVE NEGATIVE Final    Comment:        The GeneXpert MRSA Assay (FDA approved for NASAL specimens only), is one component of a comprehensive MRSA colonization surveillance  program. It is not intended to diagnose MRSA infection nor to guide or monitor treatment for MRSA infections. Performed at [facility], [place] [address] [place], [place] [address]       Radiology Studies: No results found.   Medications:  Scheduled: . amiodarone  200 mg Oral Daily  . calcium acetate  667 mg Oral TID WC  . feeding supplement (NEPRO CARB STEADY)  237 mL Oral BID BM  . ferrous sulfate  325 mg Oral Q breakfast  . insulin aspart  0-9 Units Subcutaneous TID WC  . loratadine  10 mg Oral Daily  . mouth rinse  15 mL Mouth Rinse BID  . midodrine  10 mg Oral TID WC  . multivitamin with minerals  1 tablet Oral Daily  . patiromer  16.8 g Oral Daily  . sodium bicarbonate  1,300 mg Oral BID   Continuous: . sodium chloride 10 mL/hr at 04/09/18 1709  . cefTRIAXone (ROCEPHIN)  IV Stopped (04/10/18 1503)  . furosemide 120 mg (04/11/18 0541)  . heparin 1,400 Units/hr (04/11/18 0355)   GMW:NUUVOZ chloride, acetaminophen **OR** acetaminophen, HYDROmorphone (DILAUDID) injection, ondansetron (ZOFRAN) IV, sodium chloride  Assessment/Plan:    Acute on chronic kidney disease stage III-hyperkalemia It appears that patient has been on dialysis previously but only for a brief while.  This was approximately 1 year ago.  Patient's creatinine was 1.74 in December 2018.  When he presented it was markedly elevated at greater than 6.  No hydronephrosis noted on CT scan. Nephrology was consulted.  Patient was placed on high dose of Lasix.  Echocardiogram shows worsening in his ejection fraction from 30 to 35% last year to 15-20% this year.  So his renal failure is most likely secondary to cardiorenal.  Patient continues to have poor urine output.  Blood pressures were soft and he was started on midodrine. Potassium level has improved.  He is on Veltassa.  Renal function continues to get worse.  Continues to have poor urine output.  Prognosis is extremely poor.  Seen by palliative  medicine.  Acute on chronic systolic CHF Echocardiogram shows worsening in his systolic function.  His EF is 15 to 20%.  Appreciate cardiology input.  Patient previously seen by heart failure team here as well as at [facility].  Patient not considered a candidate for advanced therapies.  He is apparently failed milrinone previously.  Nephrology was suggesting inotrope but it does not appear to be an option now. Cardiology recommended palliative medicine consult for goals of care.  Seen by palliative medicine.  CODE STATUS changed to DNR yesterday.  Further discussions today to start limiting scope of treatment.  Continue IV Lasix for now.     Bilateral lower extremity edema and cellulitis Patient has skin changes suggestive of chronic venous stasis.  Blood cultures remain negative so far.  MRSA PCR was negative.  Zosyn changed to ceftriaxone.  UTI with Klebsiella Unclear if he had symptoms.  However his urine cultures positive for Klebsiella.  Zosyn was changed over to ceftriaxone.    Hypotension Patient was started on midodrine.  Blood pressures have improved.   History of atrial fibrillation on anticoagulation with warfarin Supratherapeutic INR noted.  He was given vitamin K with improvement in INR.  Monitor on telemetry.  Continue amiodarone.  He is currently on IV heparin.  History of coronary artery disease Stable.  History of diabetes mellitus type 2 CBGs are stable.  Abnormal TSH TSH is noted to be 7.2.  Free T4 normal.  No known history of thyroid disease.  Will need to be repeated as outpatient.  Abnormal CT scan Multiple incidental findings noted including cholelithiasis.  He appears to be asymptomatic.  CT scan also raised concern for colitis however patient does not have any GI symptoms currently.  Await further input from palliative medicine after they have discussed further with family.  Patient seems to be declining and possibly even dying. Once palliative medicine elevated and  we can stop some of these medications that he is getting.  DVT Prophylaxis: IV heparin Code Status: Full code Family Communication: Discussed with the patient.  No family at bedside today. Disposition Plan: Management as outlined above.  Patient seems to be declining quite rapidly.  Await further palliative medicine input.    LOS: 6 days   [facility] Pager [phone] 04/11/2018, 9:12 AM  If 7PM-7AM, please contact night-coverage at [email or web address], password [facility]

## 2018-04-11 NOTE — Consult Note (Addendum)
   [name] CM Inpatient Consult   04/11/2018  KASHON KRAYNAK 1946-09-27 [id number]    Patient screened for potential [facility] Care Management program services due to increased unplanned readmission risk score 30% (extreme).  Chart reviewed. Patient is slated to discharge to residential hospice. Confirmed with inpatient RNCM.  No identifiable [facility] Care Management needs.    Marthenia Rolling, MSN-Ed, RN,BSN [facility] Liaison [phone]

## 2018-04-11 NOTE — Progress Notes (Signed)
Daily Progress Note   Patient Name: Christian Garner       Date: 04/11/2018 DOB: 11-Apr-1946  Age: 72 y.o. MRN#: [id number] Attending Physician: Bonnielee Haff, MD Primary Care Physician: [name], MD Admit Date: 03/14/2018  Reason for Consultation/Follow-up: Establishing goals of care, Hospice Evaluation and Psychosocial/spiritual support  Subjective: Nurse at bedside, patient very anxious today. Required ativan overnight. Dilaudid controlling dyspnea per RN.  Length of Stay: 6  Current Medications: Scheduled Meds:  . amiodarone  200 mg Oral Daily  . calcium acetate  667 mg Oral TID WC  . feeding supplement (NEPRO CARB STEADY)  237 mL Oral BID BM  . ferrous sulfate  325 mg Oral Q breakfast  . insulin aspart  0-9 Units Subcutaneous TID WC  . loratadine  10 mg Oral Daily  . mouth rinse  15 mL Mouth Rinse BID  . midodrine  10 mg Oral TID WC  . multivitamin with minerals  1 tablet Oral Daily  . patiromer  16.8 g Oral Daily  . sodium bicarbonate  1,300 mg Oral BID    Continuous Infusions: . sodium chloride 10 mL/hr at 04/09/18 1709  . cefTRIAXone (ROCEPHIN)  IV Stopped (04/11/18 1321)  . furosemide 120 mg (04/11/18 1322)  . heparin 1,400 Units/hr (04/11/18 0355)    PRN Meds: sodium chloride, acetaminophen **OR** acetaminophen, HYDROmorphone (DILAUDID) injection, LORazepam, ondansetron (ZOFRAN) IV, sodium chloride  Physical Exam        Constitutional: He is drowsy. He has a sickly appearance. He appears distressed.  HENT:  Head: Normocephalic and atraumatic.  Cardiovascular: Normal rate, regular rhythm and intact distal pulses.  Pulmonary/Chest: Accessory muscle usage present. Tachypnea noted. He has decreased breath sounds.  Abdominal: He exhibits distension.  Genitourinary:    Genitourinary Comments: No urine in urine bag. Neurological: Drowsy Skin: Skin is warm and dry. There is pallor. Cool, red lower extremities.   Vital Signs: BP 106/66 (BP Location: Left Arm)   Pulse (!) 59   Temp 97.8 F (36.6 C) (Oral)   Resp (!) 21   Ht 6' 2"  (1.88 m)   Wt 117.9 kg (259 lb 14.8 oz)   SpO2 93%   BMI 33.37 kg/m  SpO2: SpO2: 93 % O2 Device: O2 Device: Nasal Cannula O2 Flow Rate: O2 Flow Rate (L/min): 5 L/min  Intake/output summary:   Intake/Output Summary (Last 24 hours) at 04/11/2018 1455 Last data filed at 04/11/2018 1300 Gross per 24 hour  Intake 2192.56 ml  Output 10 ml  Net 2182.56 ml   LBM: Last BM Date: 04/10/18 Baseline Weight: Weight: 105.2 kg (232 lb) Most recent weight: Weight: 117.9 kg (259 lb 14.8 oz)       Palliative Assessment/Data: PPS 30%    Flowsheet Rows     Most Recent Value  Intake Tab  Referral Department  Hospitalist  Unit at Time of Referral  Intermediate Care Unit  Palliative Care Primary Diagnosis  Cardiac  Date Notified  04/08/18  Palliative Care Type  New Palliative care  Reason for referral  Clarify Goals of Care  Date of Admission  03/24/2018  Date first seen by Palliative Care  04/10/18  # of days Palliative referral response time  2 Day(s)  # of days IP prior to Palliative referral  3  Clinical Assessment  Palliative Performance Scale Score  30%  Psychosocial & Spiritual Assessment  Palliative Care Outcomes  Patient/Family meeting held?  Yes  Who was at the meeting?  patient and wife  Palliative Care Outcomes  Clarified goals of care, Provided end of life care assistance, Provided psychosocial or spiritual support, Changed CPR status, Improved non-pain symptom therapy, Counseled regarding hospice      Patient Active Problem List   Diagnosis Date Noted  . Shortness of breath   . Palliative care by specialist   . ARF (acute renal failure) ([place]) 03/29/2018  . Bilateral cellulitis of lower leg 03/25/2018  .  Chronic systolic CHF (congestive heart failure) ([place]) 04/09/2018  . Acute renal failure (ARF) ([place]) 04/06/2018  . Diabetic foot ulcer ([place])   . Recurrent Clostridium difficile diarrhea 09/27/2017  . CKD (chronic kidney disease) stage 4, GFR 15-29 ml/min (HCC)   . Vomiting   . C. difficile colitis 09/16/2017  . Warfarin anticoagulation 09/16/2017  . Type 2 diabetes mellitus with left diabetic foot ulcer ([place]) 09/16/2017  . Anemia due to chronic kidney disease 09/16/2017  . CKD (chronic kidney disease), stage III ([place]) 09/16/2017  . CAD (coronary artery disease) 06/07/2017  . Recurrent pleural effusion on right   . S/P thoracentesis   . Cellulitis of left lower extremity   . Goals of care, counseling/discussion   . Palliative care encounter   . Hyponatremia   . Pressure injury of skin 03/20/2017  . Acute on chronic combined systolic and diastolic CHF (congestive heart failure) ([place]) 03/19/2017  . Acute on chronic respiratory failure with hypoxia and hypercapnia ([place]) 03/19/2017  . AF (paroxysmal atrial fibrillation) ([place]) 03/19/2017  . Community acquired pneumonia 03/19/2017  . Pleural effusion   . Acute kidney injury superimposed on chronic kidney disease [facility])     Palliative Care Assessment & Plan   HPI: 72 y.o. male  with past medical history of systolic CHF (EF in 2119 41-74%), CAD s/p stent, CKD3, a fib on coumadin, and T2DM with a foot ulcer admitted on 03/14/2018 with worsening bilateral lower extremity edema and weakness. Found to have AKI and UTI. Baseline creatinine is around 2 - currently creatinine is 8.5 and potassium is 5.1. Per nephrology, AKI is d/t cardiorenal syndrome and sepsis. Also has bilateral lower extremity cellulitis. This admission echo showed worsening EF now at 15-20%. He has been evaluated at [facility] and is not a candidate for advanced therapy due to age and comorbidities. He has previously failed milrinone. He is now on IV lasix - has been increased - with no  improvement. Nephrology considering dialysis but he may not tolerate, is poor candidate. PMT consulted for goals of care.  Assessment: F/u meeting with family today. Met with patient's wife, daughter Luetta Nutting and they called other daughter Ledell Noss and had her on speaker phone to participate in conversation.   Participated in life review. They shared funny and sweet stories about the patient.   They have watched him decline and recognize he is nearing end of life. They feel like he was supposed to die 2 years ago due to heart failure so are viewing this past two years as "extra time" with him.   We discussed kidney and heart failure at length and answered all questions. Discussed EOL expectations. I shared that I felt time was short. Possibly days - week.  The difference between aggressive medical intervention and comfort  care was considered in light of the patient's goals of care. They understand there are not any aggressive medical interventions left to offer for his heart failure. We discussed focusing on comfort. They agree our care moving forward should be aimed at comfort.   Family is obviously sad during conversation but understanding and appreciative.   Hospice and Palliative Care services outpatient were explained and offered. Wife tells me that she cannot take patient home - he is too weak and frail. We discussed that is likely eligible for hospice facility. She agrees that this would be best plan of care moving forward.   Questions and concerns were addressed. The family was encouraged to call with questions or concerns.   Recommendations/Plan:  Full comfort care - discontinued unnecessary orders  Continue PRN dilaudid IV and oxy IR for pain and dyspnea, ativan PRN anxiety, robinul prn secretions  CSW consult for residential hospcie  Goals of Care and Additional Recommendations:  Limitations on Scope of Treatment: Full Comfort Care  Code Status:  DNR  Prognosis:   < 2 weeks  d/t end stage heart and kidney failure  Discharge Planning:  Hospice facility - family requests [facility] plan was discussed with patient's wife and daughters, bedside RN, Dr. Maryland Pink, social work - Percell Locus  Thank you for allowing the Palliative Medicine Team to assist in the care of this patient.   Time In: 1500 Time Out: 1610 Total Time 70 minutes Prolonged Time Billed  yes       Greater than 50%  of this time was spent counseling and coordinating care related to the above assessment and plan.  Juel Burrow, DNP, [name] Palliative Medicine Team Team Phone # [phone]  Pager [phone]

## 2018-04-11 NOTE — Progress Notes (Signed)
CSW received referral regarding residential hospice placement. CSW sent referral to [facility] for review with anticipation of discharge tomorrow.   Percell Locus Zaara Sprowl LCSW [phone]

## 2018-04-11 NOTE — Progress Notes (Signed)
Nurse suggested patient might appreciate a visit.  Prior to arriving to his room he starting calling out for help.  He wanted help-like medication or something but said no when asked if he would like to see the chaplain. He seemed in distress at the time.  If I have opportunity will try to check back later when he isn't distressed. Nurse shared how he and his wife have decided to make him DNR (when asked separately) They had trouble saying it together and not sure how daughter might respond to this.   Conard Novak, Chaplain   04/11/18 1100  Clinical Encounter Type  Visited With Other (Comment) (patient did not want visit)  Referral From Nurse  Consult/Referral To Chaplain  Spiritual Encounters  Spiritual Needs Emotional  Stress Factors  Patient Stress Factors Not reviewed  Family Stress Factors Not reviewed

## 2018-04-11 DEATH — deceased

## 2018-04-12 LAB — GLUCOSE, CAPILLARY: Glucose-Capillary: 141 mg/dL — ABNORMAL HIGH (ref 70–99)

## 2018-05-12 DIAGNOSIS — 419620001 Death: Secondary | SNOMED CT

## 2018-05-12 NOTE — Progress Notes (Signed)
Palliative care:  Rounding on patient this AM and found that patient had passed. Spoke with nursing and MD. Called wife to notify her of patient's death. She expressed deep grief and relief that he is no longer suffering. Allowed her space to grieve and offered my condolences. Answered her questions about next steps. She is trying to decide if she wants to come to the hospital and see him. Will continue to be available as support for family.  Thank for allowing the PMT to care for this patient and family.  15 minutes  Juel Burrow, DNP, [facility] Palliative Medicine Team Team Phone # [phone]  Pager # [phone]

## 2018-05-12 NOTE — Discharge Summary (Signed)
Death Summary  Christian Garner:[id number] DOB: 1946-09-05 DOA: 04/22/2018  PCP: [name], MD  Admit date: April 22, 2018 Date of Death: 29-Apr-2018  Cause of Death: Acute systolic CHF   History of present illness: 72 year old Caucasian male with a past medical history of chronic systolic CHF EF known to be 30 to 35% based on echo done in June 2018, coronary artery disease status post PTCA, atrial fibrillation on Coumadin, chronic kidney disease stage III presented with worsening bilateral lower extremity swelling and cellulitis as well as generalized weakness.  Patient was noted to be hypotensive and given fluids in the ED.  Creatinine was noted to be markedly elevated at 6.69 with the hyperkalemia.  Nephrology was consulted.  Patient was hospitalized.  Patient was seen by cardiology as well.  Repeat echocardiogram showed worsening CHF.  Prognosis thought to be poor.  Palliative medicine was consulted.   Hospital Course:   Acute on chronic kidney disease stage III/Hyperkalemia It appears that patient has been on dialysis previously but only for a brief while.  This was approximately 1 year ago.  Patient's creatinine was 1.74 in December 2018.  When he presented it was markedly elevated at greater than 6.  No hydronephrosis noted on CT scan. Nephrology was consulted.  Patient was placed on high dose of Lasix.  Echocardiogram shows worsening in his ejection fraction from 30 to 35% last year to 15-20% this year.  So his renal failure is most likely secondary to cardiorenal.  Patient continued to have poor urine output.  Blood pressures were soft and he was started on midodrine. Potassium level has improved.  He was placed on Veltassa.    However his renal function continued to get worse.  Prognosis was thought to be poor.  Not thought to be a good candidate for dialysis.  Palliative medicine was consulted.    Acute on chronic systolic CHF Echocardiogram shows worsening in his systolic function.   His EF is 15 to 20%.    Patient was seen by cardiology.  Patient previously seen by heart failure team here as well as at [name].  Patient not considered a candidate for advanced therapies.  He is apparently failed milrinone previously.  Cardiology recommended palliative medicine consult for goals of care.  Seen by palliative medicine.  CODE STATUS changed to DNR.  Plan was for transition to hospice home.  However patient declined this morning and passed away at 8:10 AM.     Bilateral lower extremity edema and cellulitis Patient has skin changes suggestive of chronic venous stasis.  Blood cultures remain negative so far.  MRSA PCR was negative.  Zosyn changed to ceftriaxone.  UTI with Klebsiella Unclear if he had symptoms.  However his urine cultures positive for Klebsiella.  Zosyn was changed over to ceftriaxone.    Hypotension Patient was started on midodrine.  Blood pressures have improved.   History of atrial fibrillation on anticoagulation with warfarin Supratherapeutic INR noted.  He was given vitamin K with improvement in INR.    He was continued on amiodarone as well as placed on IV heparin.  History of coronary artery disease Stable.  History of diabetes mellitus type 2 CBGs are stable.  Abnormal TSH TSH is noted to be 7.2.  Free T4 normal.  No known history of thyroid disease.  Will need to be repeated as outpatient.  Abnormal CT scan Multiple incidental findings noted including cholelithiasis.  He appears to be asymptomatic.  CT scan also raised concern for  colitis however patient did not have any GI symptoms currently.  Patient expired on 2018-04-13 at 8:10 AM.     The results of significant diagnostics from this hospitalization (including imaging, microbiology, ancillary and laboratory) are listed below for reference.    Significant Diagnostic Studies: US Renal  Result Date: 04/06/2018 CLINICAL DATA:  Acute kidney injury. EXAM: RENAL / URINARY TRACT ULTRASOUND  COMPLETE COMPARISON:  CT abdomen pelvis from same day. FINDINGS: Right Kidney: Length: 14.2. Duplicated collecting system. Echogenicity within normal limits. Mild cortical thinning. No mass or hydronephrosis visualized. 2.2 cm simple cyst in the lower pole. Left Kidney: Length: 12.0 cm. Echogenicity within normal limits. Mild cortical thickening. No mass or hydronephrosis visualized. Bladder: Decompressed by Foley catheter. Small ascites. IMPRESSION: 1. No acute abnormality.  Mild bilateral renal cortical thinning. 2. Small ascites. Electronically Signed   By: Titus Dubin M.D.   On: 04/06/2018 18:55   Dg Chest Port 1 View  Result Date: 04/07/2018 CLINICAL DATA:  72 year old male with respiratory failure and shortness of breath. EXAM: PORTABLE CHEST 1 VIEW COMPARISON:  Chest radiograph dated 03/20/2018 FINDINGS: There is cardiomegaly with vascular congestion. Superimposed pneumonia is not excluded. Clinical correlation is recommended. Interval development of a small moderate right pleural effusion. Atherosclerotic calcification of the aortic arch. No acute osseous pathology. IMPRESSION: Cardiomegaly with findings of worsened CHF and small right pleural effusion. Pneumonia is not excluded. Clinical correlation is recommended. Electronically Signed   By: Anner Crete M.D.   On: 04/07/2018 01:43   Dg Chest Port 1 View  Result Date: 04/04/2018 CLINICAL DATA:  Weakness EXAM: PORTABLE CHEST 1 VIEW COMPARISON:  11/15/2017 chest radiograph FINDINGS: Cardiomegaly with calcific aortic atherosclerosis. There is increased bibasilar airspace opacity without large consolidation. No overt pulmonary edema. No pneumothorax or sizable pleural effusion. IMPRESSION: Cardiomegaly and calcific aortic atherosclerosis (ICD10-I70.0). Bibasilar airspace opacities may indicate atelectasis and/or infection. If the patient is able, an upright PA and lateral series might be helpful. Electronically Signed   By: Ulyses Jarred M.D.    On: 03/31/2018 20:20   Ct Renal Stone Study  Result Date: 04/06/2018 CLINICAL DATA:  72 year old male with renal failure. Patient presenting with weakness. EXAM: CT ABDOMEN AND PELVIS WITHOUT CONTRAST TECHNIQUE: Multidetector CT imaging of the abdomen and pelvis was performed following the standard protocol without IV contrast. COMPARISON:  CT of the abdomen pelvis dated 06/25/2017 FINDINGS: Evaluation of this exam is limited in the absence of intravenous contrast. Lower chest: Partially visualized moderate right pleural effusion with associated partial compressive atelectasis of the right lower lobe. Pneumonia is not excluded. Clinical correlation is recommended. There is mild cardiomegaly. Coronary vascular calcification noted. No intra-abdominal free air. Small subhepatic ascites extending into the pelvis. Hepatobiliary: The liver is unremarkable. There is stone within the gallbladder. Mild thickened appearance of the gallbladder wall, likely related to ascites. Ultrasound may provide better evaluation of the gallbladder if there is high clinical concern for acute cholecystitis. Pancreas: Unremarkable. No pancreatic ductal dilatation or surrounding inflammatory changes. Spleen: Normal in size without focal abnormality. Adrenals/Urinary Tract: The adrenal glands are unremarkable. There is a duplicated right renal collecting system. There is no hydronephrosis or nephrolithiasis on either side. Mild renal parenchyma atrophy. Multiple right renal hypodense lesions are not well characterized on this unenhanced CT but appears similar to prior CT. These measure up to 2.5 cm in the interpolar aspect of the kidney. The visualized ureters and urinary bladder appear unremarkable. Stomach/Bowel: Mild thickened appearance of the colon although may be  related to underdistention is concerning for colitis. Clinical correlation is recommended. No bowel obstruction. Normal appendix. Vascular/Lymphatic: Advanced aortoiliac  atherosclerotic disease. No portal venous gas. There is no adenopathy. Reproductive: Mildly enlarged prostate gland with dystrophic calcification. Other: Small fat containing umbilical hernia as well as small fat containing bilateral inguinal hernias. Fatty atrophy of the musculature of the abdominal and pelvic wall. There is mild diffuse subcutaneous edema. Musculoskeletal: Degenerative changes of the spine. No acute osseous pathology. IMPRESSION: 1. Findings concerning for colitis. Correlation with clinical exam and stool cultures recommended. No bowel obstruction. Normal appendix. 2. No hydronephrosis or nephrolithiasis. 3. Cholelithiasis. Ultrasound may provide better evaluation of the gallbladder if clinically indicated. 4. Moderate right pleural effusion with associated partial compressive atelectasis of the right lower lobe. There is a small ascites. 5.  Aortic Atherosclerosis (ICD10-I70.0). Electronically Signed   By: Anner Crete M.D.   On: 04/06/2018 01:07    Microbiology: Recent Results (from the past 240 hour(s))  Blood Culture (routine x 2)     Status: None   Collection Time: 03/29/2018  7:34 PM  Result Value Ref Range Status   Specimen Description BLOOD LEFT ANTECUBITAL  Final   Special Requests   Final    BOTTLES DRAWN AEROBIC AND ANAEROBIC Blood Culture results may not be optimal due to an inadequate volume of blood received in culture bottles   Culture   Final    NO GROWTH 5 DAYS Performed at [facility], [place] [address], [place], [place] [address]    Report Status 04/10/2018 FINAL  Final  Blood Culture (routine x 2)     Status: None   Collection Time: 03/27/2018  7:49 PM  Result Value Ref Range Status   Specimen Description BLOOD RIGHT HAND  Final   Special Requests   Final    BOTTLES DRAWN AEROBIC ONLY Blood Culture results may not be optimal due to an inadequate volume of blood received in culture bottles   Culture   Final    NO GROWTH 5 DAYS Performed at [facility], [place] [address] [place], [place] [address]    Report Status 04/10/2018 FINAL  Final  Urine culture     Status: Abnormal   Collection Time: 03/18/2018 11:48 PM  Result Value Ref Range Status   Specimen Description URINE, RANDOM  Final   Special Requests   Final    NONE Performed at [facility], [place] [address], [place], [place] [address]    Culture >=100,000 COLONIES/mL KLEBSIELLA PNEUMONIAE (A)  Final   Report Status 04/08/2018 FINAL  Final   Organism ID, Bacteria KLEBSIELLA PNEUMONIAE (A)  Final      Susceptibility   Klebsiella pneumoniae - MIC*    AMPICILLIN >=32 RESISTANT Resistant     CEFAZOLIN <=4 SENSITIVE Sensitive     CEFTRIAXONE <=1 SENSITIVE Sensitive     CIPROFLOXACIN <=0.25 SENSITIVE Sensitive     GENTAMICIN <=1 SENSITIVE Sensitive     IMIPENEM <=0.25 SENSITIVE Sensitive     NITROFURANTOIN 128 RESISTANT Resistant     TRIMETH/SULFA <=20 SENSITIVE Sensitive     AMPICILLIN/SULBACTAM 8 SENSITIVE Sensitive     PIP/TAZO <=4 SENSITIVE Sensitive     Extended ESBL NEGATIVE Sensitive     * >=100,000 COLONIES/mL KLEBSIELLA PNEUMONIAE  MRSA PCR Screening     Status: None   Collection Time: 04/06/18  2:16 AM  Result Value Ref Range Status   MRSA by PCR NEGATIVE NEGATIVE Final    Comment:  The GeneXpert MRSA Assay (FDA approved for NASAL specimens only), is one component of a comprehensive MRSA colonization surveillance program. It is not intended to diagnose MRSA infection nor to guide or monitor treatment for MRSA infections. Performed at [facility], [place] [address], [place], [place] [address]      Labs: Basic Metabolic Panel: Recent Labs  Lab 04/07/18 0645 04/07/18 1508 04/08/18 0616 04/09/18 0800 04/10/18 0432 04/11/18 0436  NA 132* 132* 133* 134* 132* 132*  K 6.8* 6.2* 5.1 5.1 5.0 5.1  CL 97* 97* 100 98 96* 93*  CO2 24 22 19* 21* 21* 24  GLUCOSE 118* 127* 88 108* 111* 118*  BUN 116* 119* 119* 117* 118* 122*  CREATININE 6.51*  6.44* 6.49* 6.99* 7.60* 8.50*  CALCIUM 8.1* 8.0* 7.9* 8.2* 8.3* 8.8*  MG 3.2*  --   --   --   --   --   PHOS 6.5* 6.7* 7.5* 7.6* 7.9*  --    Liver Function Tests: Recent Labs  Lab 03/27/2018 1934 04/06/18 0121 04/07/18 0645 04/07/18 1508 04/08/18 0616 04/09/18 0800 04/10/18 0432  AST 21 19  --   --   --   --   --   ALT 25 26  --   --   --   --   --   ALKPHOS 109 99  --   --   --   --   --   BILITOT 0.6 0.7  --   --   --   --   --   PROT 6.8 6.3*  --   --   --   --   --   ALBUMIN 2.8* 2.5* 2.5* 2.5* 2.3* 3.3* 3.2*   Recent Labs  Lab 03/20/2018 1934  LIPASE 47   CBC: Recent Labs  Lab 04/09/2018 1934 04/06/18 0121 04/07/18 0645 04/08/18 0616 04/09/18 0800 04/10/18 0432 04/11/18 0436  WBC 11.5* 12.3* 12.0* 11.7* 11.1* 13.1* 14.3*  NEUTROABS 9.5* 9.6* 9.8*  --   --   --   --   HGB 13.9 13.4 13.6 12.9* 12.0* 12.5* 12.8*  HCT 46.2 42.8 43.3 42.8 39.3 40.7 42.0  MCV 98.9 97.5 96.9 98.8 98.3 97.8 98.6  PLT 247 231 245 198 187 212 211   Cardiac Enzymes: Recent Labs  Lab 04/06/18 0121  CKTOTAL 194  TROPONINI 0.04*   BNP: Invalid input(s): POCBNP CBG: Recent Labs  Lab 04/10/18 2235 04/11/18 0751 04/11/18 1208 04/11/18 2153 2018-04-15 0717  GLUCAP 133* 111* 145* 131* 141*   Urinalysis    Component Value Date/Time   COLORURINE YELLOW 04/06/2018 1523   APPEARANCEUR TURBID (A) 04/06/2018 1523   LABSPEC 1.015 04/06/2018 1523   PHURINE 5.0 04/06/2018 1523   GLUCOSEU NEGATIVE 04/06/2018 1523   HGBUR LARGE (A) 04/06/2018 1523   BILIRUBINUR NEGATIVE 04/06/2018 1523   KETONESUR NEGATIVE 04/06/2018 1523   PROTEINUR 100 (A) 04/06/2018 1523   NITRITE NEGATIVE 04/06/2018 1523   LEUKOCYTESUR MODERATE (A) 04/06/2018 1523     Eoin Willden  [facility] 04-15-2018, 1:50 PM

## 2018-05-12 NOTE — Progress Notes (Addendum)
At 08:10 AM nurse tech called writer in the patient's room. When writer entered in the patient's room, patient didn't have any pulse; no respiratory sounds; no chest wall moving. Charge nurse was informed and MD was notified that patient passed away.

## 2018-05-12 NOTE — Progress Notes (Signed)
Patient passed away earlier in morning.  Chaplain is available for family when/if they choose to come and see him.  I did go by yesterday afternoon and was preparing to see him when asked he did not want to be visited by chaplain.  Christian Garner, Chaplain   2018-05-05 1100  Clinical Encounter Type  Visited With Other (Comment) (patient passed away earlier in morning.)

## 2018-05-12 NOTE — Progress Notes (Signed)
Patient's family came to see the body. All the information requested were provided by staff. They are OK with transported the body to the morgue.  The body will be transported to the morgue by nurse tech.

## 2018-05-12 DEATH — deceased
